# Patient Record
Sex: Female | Born: 1960
Health system: Southern US, Community
[De-identification: ages and names within clinical notes are randomized; demographics above are authoritative.]

## PROBLEM LIST (undated history)

## (undated) DIAGNOSIS — I5032 Chronic diastolic (congestive) heart failure: Secondary | ICD-10-CM

## (undated) DIAGNOSIS — M1990 Unspecified osteoarthritis, unspecified site: Secondary | ICD-10-CM

## (undated) DIAGNOSIS — B9681 Helicobacter pylori [H. pylori] as the cause of diseases classified elsewhere: Secondary | ICD-10-CM

## (undated) DIAGNOSIS — G4730 Sleep apnea, unspecified: Secondary | ICD-10-CM

## (undated) DIAGNOSIS — K259 Gastric ulcer, unspecified as acute or chronic, without hemorrhage or perforation: Secondary | ICD-10-CM

## (undated) DIAGNOSIS — J302 Other seasonal allergic rhinitis: Secondary | ICD-10-CM

## (undated) DIAGNOSIS — I499 Cardiac arrhythmia, unspecified: Secondary | ICD-10-CM

## (undated) DIAGNOSIS — I509 Heart failure, unspecified: Secondary | ICD-10-CM

## (undated) DIAGNOSIS — K029 Dental caries, unspecified: Secondary | ICD-10-CM

## (undated) DIAGNOSIS — I480 Paroxysmal atrial fibrillation: Secondary | ICD-10-CM

## (undated) DIAGNOSIS — D649 Anemia, unspecified: Secondary | ICD-10-CM

## (undated) DIAGNOSIS — J449 Chronic obstructive pulmonary disease, unspecified: Secondary | ICD-10-CM

## (undated) DIAGNOSIS — J45909 Unspecified asthma, uncomplicated: Secondary | ICD-10-CM

## (undated) DIAGNOSIS — F419 Anxiety disorder, unspecified: Secondary | ICD-10-CM

## (undated) DIAGNOSIS — K2970 Gastritis, unspecified, without bleeding: Secondary | ICD-10-CM

## (undated) DIAGNOSIS — K921 Melena: Secondary | ICD-10-CM

## (undated) DIAGNOSIS — J189 Pneumonia, unspecified organism: Secondary | ICD-10-CM

## (undated) DIAGNOSIS — I10 Essential (primary) hypertension: Secondary | ICD-10-CM

## (undated) DIAGNOSIS — IMO0001 Reserved for inherently not codable concepts without codable children: Secondary | ICD-10-CM

## (undated) HISTORY — DX: Helicobacter pylori (H. pylori) as the cause of diseases classified elsewhere: B96.81

## (undated) HISTORY — DX: Gastric ulcer, unspecified as acute or chronic, without hemorrhage or perforation: K25.9

## (undated) HISTORY — DX: Dental caries, unspecified: K02.9

## (undated) HISTORY — DX: Essential (primary) hypertension: I10

## (undated) HISTORY — DX: Other seasonal allergic rhinitis: J30.2

## (undated) HISTORY — DX: Heart failure, unspecified: I50.9

## (undated) HISTORY — PX: TUBAL LIGATION: SHX77

## (undated) HISTORY — PX: FRACTURE SURGERY: SHX138

## (undated) HISTORY — DX: Gastritis, unspecified, without bleeding: K29.70

---
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
## 1982-07-21 HISTORY — PX: NASAL SINUS SURGERY: SHX719

## 1988-07-21 HISTORY — PX: OVARIAN CYST REMOVAL: SHX89

## 1995-07-22 HISTORY — PX: BARTHOLIN CYST MARSUPIALIZATION: SHX5383

## 1997-11-19 ENCOUNTER — Emergency Department (HOSPITAL_COMMUNITY): Admission: EM | Admit: 1997-11-19 | Discharge: 1997-11-19 | Payer: Self-pay | Admitting: Emergency Medicine

## 1997-11-20 ENCOUNTER — Ambulatory Visit (HOSPITAL_COMMUNITY): Admission: RE | Admit: 1997-11-20 | Discharge: 1997-11-20 | Payer: Self-pay | Admitting: Internal Medicine

## 1998-09-22 ENCOUNTER — Emergency Department (HOSPITAL_COMMUNITY): Admission: EM | Admit: 1998-09-22 | Discharge: 1998-09-22 | Payer: Self-pay | Admitting: Emergency Medicine

## 1998-09-22 ENCOUNTER — Encounter: Payer: Self-pay | Admitting: Emergency Medicine

## 1999-03-14 ENCOUNTER — Ambulatory Visit (HOSPITAL_COMMUNITY): Admission: RE | Admit: 1999-03-14 | Discharge: 1999-03-14 | Payer: Self-pay | Admitting: Family Medicine

## 1999-03-14 ENCOUNTER — Encounter: Payer: Self-pay | Admitting: Family Medicine

## 1999-05-21 ENCOUNTER — Emergency Department (HOSPITAL_COMMUNITY): Admission: EM | Admit: 1999-05-21 | Discharge: 1999-05-21 | Payer: Self-pay | Admitting: *Deleted

## 1999-05-21 ENCOUNTER — Encounter: Payer: Self-pay | Admitting: Emergency Medicine

## 1999-12-06 ENCOUNTER — Encounter: Payer: Self-pay | Admitting: Emergency Medicine

## 1999-12-06 ENCOUNTER — Emergency Department (HOSPITAL_COMMUNITY): Admission: EM | Admit: 1999-12-06 | Discharge: 1999-12-07 | Payer: Self-pay | Admitting: Emergency Medicine

## 2000-05-31 ENCOUNTER — Emergency Department (HOSPITAL_COMMUNITY): Admission: EM | Admit: 2000-05-31 | Discharge: 2000-06-01 | Payer: Self-pay | Admitting: Emergency Medicine

## 2000-05-31 ENCOUNTER — Encounter: Payer: Self-pay | Admitting: Emergency Medicine

## 2000-07-21 HISTORY — PX: COLONOSCOPY: SHX174

## 2001-07-20 LAB — HM COLONOSCOPY: HM Colonoscopy: NORMAL

## 2003-02-09 ENCOUNTER — Emergency Department (HOSPITAL_COMMUNITY): Admission: AD | Admit: 2003-02-09 | Discharge: 2003-02-09 | Payer: Self-pay | Admitting: Emergency Medicine

## 2003-02-09 ENCOUNTER — Encounter: Payer: Self-pay | Admitting: Emergency Medicine

## 2003-04-04 ENCOUNTER — Emergency Department (HOSPITAL_COMMUNITY): Admission: EM | Admit: 2003-04-04 | Discharge: 2003-04-04 | Payer: Self-pay

## 2003-07-22 HISTORY — PX: FRACTURE SURGERY: SHX138

## 2003-07-22 HISTORY — PX: UMBILICAL HERNIA REPAIR: SHX196

## 2004-09-08 ENCOUNTER — Ambulatory Visit: Payer: Self-pay | Admitting: Psychiatry

## 2004-09-08 ENCOUNTER — Encounter: Payer: Self-pay | Admitting: Emergency Medicine

## 2004-09-09 ENCOUNTER — Inpatient Hospital Stay (HOSPITAL_COMMUNITY): Admission: EM | Admit: 2004-09-09 | Discharge: 2004-09-13 | Payer: Self-pay | Admitting: Psychiatry

## 2004-12-31 ENCOUNTER — Emergency Department (HOSPITAL_COMMUNITY): Admission: EM | Admit: 2004-12-31 | Discharge: 2004-12-31 | Payer: Self-pay | Admitting: Family Medicine

## 2005-01-12 ENCOUNTER — Emergency Department (HOSPITAL_COMMUNITY): Admission: EM | Admit: 2005-01-12 | Discharge: 2005-01-12 | Payer: Self-pay | Admitting: Emergency Medicine

## 2005-02-19 ENCOUNTER — Ambulatory Visit: Payer: Self-pay | Admitting: Physical Medicine & Rehabilitation

## 2005-02-19 ENCOUNTER — Inpatient Hospital Stay (HOSPITAL_COMMUNITY): Admission: EM | Admit: 2005-02-19 | Discharge: 2005-02-27 | Payer: Self-pay | Admitting: Emergency Medicine

## 2005-05-20 ENCOUNTER — Encounter: Admission: RE | Admit: 2005-05-20 | Discharge: 2005-07-01 | Payer: Self-pay | Admitting: Orthopedic Surgery

## 2006-03-06 ENCOUNTER — Inpatient Hospital Stay (HOSPITAL_COMMUNITY): Admission: AD | Admit: 2006-03-06 | Discharge: 2006-03-06 | Payer: Self-pay | Admitting: Obstetrics & Gynecology

## 2006-11-25 ENCOUNTER — Emergency Department (HOSPITAL_COMMUNITY): Admission: EM | Admit: 2006-11-25 | Discharge: 2006-11-25 | Payer: Self-pay | Admitting: Emergency Medicine

## 2007-05-13 ENCOUNTER — Emergency Department (HOSPITAL_COMMUNITY): Admission: EM | Admit: 2007-05-13 | Discharge: 2007-05-13 | Payer: Self-pay | Admitting: Emergency Medicine

## 2007-11-08 ENCOUNTER — Emergency Department (HOSPITAL_COMMUNITY): Admission: EM | Admit: 2007-11-08 | Discharge: 2007-11-08 | Payer: Self-pay | Admitting: Emergency Medicine

## 2008-01-16 ENCOUNTER — Emergency Department (HOSPITAL_COMMUNITY): Admission: EM | Admit: 2008-01-16 | Discharge: 2008-01-16 | Payer: Self-pay | Admitting: Emergency Medicine

## 2008-04-18 ENCOUNTER — Emergency Department (HOSPITAL_COMMUNITY): Admission: EM | Admit: 2008-04-18 | Discharge: 2008-04-18 | Payer: Self-pay | Admitting: Emergency Medicine

## 2008-12-19 ENCOUNTER — Emergency Department (HOSPITAL_COMMUNITY): Admission: EM | Admit: 2008-12-19 | Discharge: 2008-12-19 | Payer: Self-pay | Admitting: Emergency Medicine

## 2009-03-30 ENCOUNTER — Inpatient Hospital Stay (HOSPITAL_COMMUNITY): Admission: EM | Admit: 2009-03-30 | Discharge: 2009-04-04 | Payer: Self-pay | Admitting: Emergency Medicine

## 2009-04-01 ENCOUNTER — Ambulatory Visit: Payer: Self-pay | Admitting: Psychiatry

## 2009-05-02 ENCOUNTER — Ambulatory Visit: Payer: Self-pay | Admitting: Internal Medicine

## 2010-01-22 ENCOUNTER — Emergency Department (HOSPITAL_COMMUNITY): Admission: EM | Admit: 2010-01-22 | Discharge: 2010-01-22 | Payer: Self-pay | Admitting: Emergency Medicine

## 2010-03-13 ENCOUNTER — Ambulatory Visit: Payer: Self-pay | Admitting: Pulmonary Disease

## 2010-03-14 ENCOUNTER — Inpatient Hospital Stay (HOSPITAL_COMMUNITY): Admission: EM | Admit: 2010-03-14 | Discharge: 2010-03-15 | Payer: Self-pay | Admitting: Emergency Medicine

## 2010-03-14 ENCOUNTER — Ambulatory Visit: Payer: Self-pay | Admitting: Internal Medicine

## 2010-03-15 ENCOUNTER — Encounter: Payer: Self-pay | Admitting: Pulmonary Disease

## 2010-10-03 LAB — CBC
HCT: 34.8 % — ABNORMAL LOW (ref 36.0–46.0)
Hemoglobin: 11.1 g/dL — ABNORMAL LOW (ref 12.0–15.0)
Hemoglobin: 12.6 g/dL (ref 12.0–15.0)
MCH: 26.4 pg (ref 26.0–34.0)
MCHC: 31.9 g/dL (ref 30.0–36.0)
MCV: 82.7 fL (ref 78.0–100.0)
Platelets: 233 10*3/uL (ref 150–400)
RBC: 4.08 MIL/uL (ref 3.87–5.11)
RDW: 17.9 % — ABNORMAL HIGH (ref 11.5–15.5)
RDW: 18 % — ABNORMAL HIGH (ref 11.5–15.5)
WBC: 11.1 10*3/uL — ABNORMAL HIGH (ref 4.0–10.5)
WBC: 5.3 10*3/uL (ref 4.0–10.5)

## 2010-10-03 LAB — CARDIAC PANEL(CRET KIN+CKTOT+MB+TROPI)
CK, MB: 1 ng/mL (ref 0.3–4.0)
Relative Index: INVALID (ref 0.0–2.5)
Relative Index: INVALID (ref 0.0–2.5)
Total CK: 70 U/L (ref 7–177)
Troponin I: 0.04 ng/mL (ref 0.00–0.06)

## 2010-10-03 LAB — URINALYSIS, ROUTINE W REFLEX MICROSCOPIC
Bilirubin Urine: NEGATIVE
Ketones, ur: NEGATIVE mg/dL
Nitrite: NEGATIVE
Specific Gravity, Urine: 1.005 (ref 1.005–1.030)
Urobilinogen, UA: 0.2 mg/dL (ref 0.0–1.0)

## 2010-10-03 LAB — HEPATIC FUNCTION PANEL
Albumin: 3.9 g/dL (ref 3.5–5.2)
Alkaline Phosphatase: 75 U/L (ref 39–117)
Total Protein: 7.5 g/dL (ref 6.0–8.3)

## 2010-10-03 LAB — DIFFERENTIAL
Basophils Absolute: 0 10*3/uL (ref 0.0–0.1)
Basophils Absolute: 0 10*3/uL (ref 0.0–0.1)
Basophils Relative: 1 % (ref 0–1)
Eosinophils Absolute: 0 10*3/uL (ref 0.0–0.7)
Eosinophils Absolute: 0.1 10*3/uL (ref 0.0–0.7)
Eosinophils Relative: 2 % (ref 0–5)
Lymphocytes Relative: 27 % (ref 12–46)
Lymphocytes Relative: 33 % (ref 12–46)
Lymphs Abs: 1.5 10*3/uL (ref 0.7–4.0)
Lymphs Abs: 2.9 10*3/uL (ref 0.7–4.0)
Monocytes Absolute: 0.5 10*3/uL (ref 0.1–1.0)
Monocytes Absolute: 0.6 10*3/uL (ref 0.1–1.0)
Monocytes Relative: 12 % (ref 3–12)
Monocytes Relative: 5 % (ref 3–12)
Neutrophils Relative %: 67 % (ref 43–77)
Neutrophils Relative %: 75 % (ref 43–77)

## 2010-10-03 LAB — POCT I-STAT 3, ART BLOOD GAS (G3+)
Acid-Base Excess: 5 mmol/L — ABNORMAL HIGH (ref 0.0–2.0)
O2 Saturation: 100 %
pO2, Arterial: 491 mmHg — ABNORMAL HIGH (ref 80.0–100.0)

## 2010-10-03 LAB — COMPREHENSIVE METABOLIC PANEL
ALT: 21 U/L (ref 0–35)
AST: 25 U/L (ref 0–37)
Albumin: 3.4 g/dL — ABNORMAL LOW (ref 3.5–5.2)
Alkaline Phosphatase: 68 U/L (ref 39–117)
CO2: 28 mEq/L (ref 19–32)
Calcium: 9.3 mg/dL (ref 8.4–10.5)
Chloride: 99 mEq/L (ref 96–112)
Creatinine, Ser: 0.65 mg/dL (ref 0.4–1.2)
GFR calc Af Amer: >60 mL/min (ref >60)
GFR calc non Af Amer: >60 mL/min (ref >60)
Glucose, Bld: 91 mg/dL (ref 70–99)
Potassium: 3.7 mEq/L (ref 3.5–5.1)
Sodium: 140 mEq/L (ref 135–145)
Total Bilirubin: 0.8 mg/dL (ref 0.3–1.2)
Total Protein: 6.9 g/dL (ref 6.0–8.3)

## 2010-10-03 LAB — CULTURE, BLOOD (ROUTINE X 2)
Culture: NO GROWTH
Culture: NO GROWTH

## 2010-10-03 LAB — BASIC METABOLIC PANEL
BUN: 7 mg/dL (ref 6–23)
GFR calc non Af Amer: >60 mL/min (ref >60)
Glucose, Bld: 105 mg/dL — ABNORMAL HIGH (ref 70–99)
Sodium: 139 mEq/L (ref 135–145)

## 2010-10-03 LAB — LIPID PANEL
HDL: 76 mg/dL (ref >39)
LDL Cholesterol: 91 mg/dL (ref 0–99)
Total CHOL/HDL Ratio: 2.4 RATIO
VLDL: 12 mg/dL (ref 0–40)

## 2010-10-03 LAB — POCT CARDIAC MARKERS
CKMB, poc: <1 ng/mL — ABNORMAL LOW (ref 1.0–8.0)
Myoglobin, poc: 29.8 ng/mL (ref 12–200)
Myoglobin, poc: 70.8 ng/mL (ref 12–200)
Troponin i, poc: <0.05 ng/mL (ref 0.00–0.09)
Troponin i, poc: <0.05 ng/mL (ref 0.00–0.09)

## 2010-10-03 LAB — SEDIMENTATION RATE: Sed Rate: 10 mm/hr (ref 0–22)

## 2010-10-03 LAB — CK TOTAL AND CKMB (NOT AT ARMC): Relative Index: INVALID (ref 0.0–2.5)

## 2010-10-03 LAB — D-DIMER, QUANTITATIVE: D-Dimer, Quant: 0.52 ug/mL-FEU — ABNORMAL HIGH (ref 0.00–0.48)

## 2010-10-03 LAB — RAPID URINE DRUG SCREEN, HOSP PERFORMED
Barbiturates: NOT DETECTED
Benzodiazepines: NOT DETECTED
Cocaine: NOT DETECTED

## 2010-10-03 LAB — PROTIME-INR
INR: 0.95 (ref 0.00–1.49)
Prothrombin Time: 12.9 seconds (ref 11.6–15.2)

## 2010-10-03 LAB — URINE CULTURE

## 2010-10-03 LAB — TSH
TSH: 1.66 u[IU]/mL (ref 0.350–4.500)
TSH: 3.632 u[IU]/mL (ref 0.350–4.500)

## 2010-10-03 LAB — STREP PNEUMONIAE URINARY ANTIGEN: Strep Pneumo Urinary Antigen: NEGATIVE

## 2010-10-03 LAB — BRAIN NATRIURETIC PEPTIDE: Pro B Natriuretic peptide (BNP): 59 pg/mL (ref 0.0–100.0)

## 2010-10-25 LAB — COMPREHENSIVE METABOLIC PANEL
AST: 22 U/L (ref 0–37)
AST: 23 U/L (ref 0–37)
Albumin: 3.2 g/dL — ABNORMAL LOW (ref 3.5–5.2)
Albumin: 3.5 g/dL (ref 3.5–5.2)
BUN: 6 mg/dL (ref 6–23)
CO2: 22 mEq/L (ref 19–32)
Calcium: 8.1 mg/dL — ABNORMAL LOW (ref 8.4–10.5)
Chloride: 110 mEq/L (ref 96–112)
Creatinine, Ser: 0.59 mg/dL (ref 0.4–1.2)
GFR calc Af Amer: >60 mL/min (ref >60)
Glucose, Bld: 101 mg/dL — ABNORMAL HIGH (ref 70–99)
Potassium: 3.3 mEq/L — ABNORMAL LOW (ref 3.5–5.1)
Sodium: 141 mEq/L (ref 135–145)
Total Bilirubin: 1.5 mg/dL — ABNORMAL HIGH (ref 0.3–1.2)
Total Bilirubin: 2.5 mg/dL — ABNORMAL HIGH (ref 0.3–1.2)
Total Protein: 6.5 g/dL (ref 6.0–8.3)
Total Protein: 7.2 g/dL (ref 6.0–8.3)

## 2010-10-25 LAB — HEPATIC FUNCTION PANEL
ALT: 22 U/L (ref 0–35)
ALT: 31 U/L (ref 0–35)
AST: 27 U/L (ref 0–37)
AST: 27 U/L (ref 0–37)
AST: 28 U/L (ref 0–37)
AST: 32 U/L (ref 0–37)
Albumin: 3.2 g/dL — ABNORMAL LOW (ref 3.5–5.2)
Albumin: 3.3 g/dL — ABNORMAL LOW (ref 3.5–5.2)
Albumin: 3.4 g/dL — ABNORMAL LOW (ref 3.5–5.2)
Albumin: 3.6 g/dL (ref 3.5–5.2)
Alkaline Phosphatase: 50 U/L (ref 39–117)
Alkaline Phosphatase: 53 U/L (ref 39–117)
Bilirubin, Direct: 0.1 mg/dL (ref 0.0–0.3)
Bilirubin, Direct: 0.2 mg/dL (ref 0.0–0.3)
Bilirubin, Direct: 0.3 mg/dL (ref 0.0–0.3)
Bilirubin, Direct: 0.4 mg/dL — ABNORMAL HIGH (ref 0.0–0.3)
Bilirubin, Direct: 0.4 mg/dL — ABNORMAL HIGH (ref 0.0–0.3)
Indirect Bilirubin: 0.5 mg/dL (ref 0.3–0.9)
Indirect Bilirubin: 2 mg/dL — ABNORMAL HIGH (ref 0.3–0.9)
Indirect Bilirubin: 2.1 mg/dL — ABNORMAL HIGH (ref 0.3–0.9)
Indirect Bilirubin: 2.3 mg/dL — ABNORMAL HIGH (ref 0.3–0.9)
Indirect Bilirubin: 2.7 mg/dL — ABNORMAL HIGH (ref 0.3–0.9)
Total Bilirubin: 0.6 mg/dL (ref 0.3–1.2)
Total Bilirubin: 2.4 mg/dL — ABNORMAL HIGH (ref 0.3–1.2)
Total Bilirubin: 3.1 mg/dL — ABNORMAL HIGH (ref 0.3–1.2)
Total Protein: 6.6 g/dL (ref 6.0–8.3)
Total Protein: 6.6 g/dL (ref 6.0–8.3)
Total Protein: 6.8 g/dL (ref 6.0–8.3)

## 2010-10-25 LAB — ACETAMINOPHEN LEVEL
Acetaminophen (Tylenol), Serum: 10.7 ug/mL (ref 10–30)
Acetaminophen (Tylenol), Serum: 104.3 ug/mL — ABNORMAL HIGH (ref 10–30)
Acetaminophen (Tylenol), Serum: 69.5 ug/mL — ABNORMAL HIGH (ref 10–30)
Acetaminophen (Tylenol), Serum: <10 ug/mL — ABNORMAL LOW (ref 10–30)
Acetaminophen (Tylenol), Serum: <10 ug/mL — ABNORMAL LOW (ref 10–30)
Acetaminophen (Tylenol), Serum: <10 ug/mL — ABNORMAL LOW (ref 10–30)
Acetaminophen (Tylenol), Serum: <10 ug/mL — ABNORMAL LOW (ref 10–30)

## 2010-10-25 LAB — URINALYSIS, MICROSCOPIC ONLY
Glucose, UA: NEGATIVE mg/dL
Ketones, ur: NEGATIVE mg/dL
Nitrite: NEGATIVE
Specific Gravity, Urine: 1.008 (ref 1.005–1.030)
pH: 6 (ref 5.0–8.0)

## 2010-10-25 LAB — TRICYCLICS SCREEN, URINE: TCA Scrn: NOT DETECTED

## 2010-10-25 LAB — PROTIME-INR
INR: 1 (ref 0.00–1.49)
INR: 1 (ref 0.00–1.49)
INR: 1.2 (ref 0.00–1.49)
Prothrombin Time: 13.3 seconds (ref 11.6–15.2)
Prothrombin Time: 14.6 seconds (ref 11.6–15.2)

## 2010-10-25 LAB — DIFFERENTIAL
Basophils Relative: 1 % (ref 0–1)
Lymphocytes Relative: 44 % (ref 12–46)
Lymphs Abs: 2.7 10*3/uL (ref 0.7–4.0)
Monocytes Absolute: 0.4 10*3/uL (ref 0.1–1.0)
Monocytes Relative: 6 % (ref 3–12)
Neutro Abs: 3 10*3/uL (ref 1.7–7.7)
Neutrophils Relative %: 48 % (ref 43–77)

## 2010-10-25 LAB — BASIC METABOLIC PANEL
CO2: 22 mEq/L (ref 19–32)
Calcium: 8.8 mg/dL (ref 8.4–10.5)
Calcium: 8.9 mg/dL (ref 8.4–10.5)
Creatinine, Ser: 0.54 mg/dL (ref 0.4–1.2)
Creatinine, Ser: 0.58 mg/dL (ref 0.4–1.2)
GFR calc Af Amer: >60 mL/min (ref >60)
GFR calc Af Amer: >60 mL/min (ref >60)
GFR calc non Af Amer: >60 mL/min (ref >60)
GFR calc non Af Amer: >60 mL/min (ref >60)
Sodium: 138 mEq/L (ref 135–145)
Sodium: 142 mEq/L (ref 135–145)

## 2010-10-25 LAB — ETHANOL
Alcohol, Ethyl (B): 226 mg/dL — ABNORMAL HIGH (ref 0–10)
Alcohol, Ethyl (B): 319 mg/dL — ABNORMAL HIGH (ref 0–10)

## 2010-10-25 LAB — APTT: aPTT: 26 seconds (ref 24–37)

## 2010-10-25 LAB — CARDIAC PANEL(CRET KIN+CKTOT+MB+TROPI)
CK, MB: 1.1 ng/mL (ref 0.3–4.0)
CK, MB: 1.2 ng/mL (ref 0.3–4.0)
Relative Index: INVALID (ref 0.0–2.5)
Relative Index: INVALID (ref 0.0–2.5)
Total CK: 57 U/L (ref 7–177)
Troponin I: 0.03 ng/mL (ref 0.00–0.06)
Troponin I: 0.03 ng/mL (ref 0.00–0.06)

## 2010-10-25 LAB — URINE CULTURE

## 2010-10-25 LAB — TROPONIN I
Troponin I: 0.02 ng/mL (ref 0.00–0.06)
Troponin I: 0.02 ng/mL (ref 0.00–0.06)

## 2010-10-25 LAB — RAPID URINE DRUG SCREEN, HOSP PERFORMED
Cocaine: NOT DETECTED
Tetrahydrocannabinol: NOT DETECTED

## 2010-10-25 LAB — CK TOTAL AND CKMB (NOT AT ARMC)
CK, MB: 1.1 ng/mL (ref 0.3–4.0)
Relative Index: INVALID (ref 0.0–2.5)

## 2010-10-25 LAB — CBC
Hemoglobin: 13.4 g/dL (ref 12.0–15.0)
Hemoglobin: 13.6 g/dL (ref 12.0–15.0)
MCHC: 33.6 g/dL (ref 30.0–36.0)
MCHC: 34 g/dL (ref 30.0–36.0)
RBC: 4.2 MIL/uL (ref 3.87–5.11)
RBC: 4.34 MIL/uL (ref 3.87–5.11)
RDW: 14.1 % (ref 11.5–15.5)
WBC: 6.2 10*3/uL (ref 4.0–10.5)

## 2010-10-28 LAB — URINALYSIS, ROUTINE W REFLEX MICROSCOPIC
Bilirubin Urine: NEGATIVE
Glucose, UA: NEGATIVE mg/dL
Hgb urine dipstick: NEGATIVE
Ketones, ur: NEGATIVE mg/dL
Nitrite: NEGATIVE
Protein, ur: NEGATIVE mg/dL
Specific Gravity, Urine: 1.008 (ref 1.005–1.030)
Urobilinogen, UA: 1 mg/dL (ref 0.0–1.0)
pH: 7.5 (ref 5.0–8.0)

## 2010-10-28 LAB — POCT CARDIAC MARKERS
CKMB, poc: <1 ng/mL — ABNORMAL LOW (ref 1.0–8.0)
Troponin i, poc: <0.05 ng/mL (ref 0.00–0.09)

## 2010-10-28 LAB — RAPID URINE DRUG SCREEN, HOSP PERFORMED
Amphetamines: NOT DETECTED
Barbiturates: NOT DETECTED
Benzodiazepines: NOT DETECTED
Cocaine: NOT DETECTED
Opiates: NOT DETECTED
Tetrahydrocannabinol: NOT DETECTED

## 2010-10-28 LAB — DIFFERENTIAL
Basophils Absolute: 0 10*3/uL (ref 0.0–0.1)
Basophils Relative: 1 % (ref 0–1)
Eosinophils Absolute: 0 10*3/uL (ref 0.0–0.7)
Eosinophils Relative: 1 % (ref 0–5)
Lymphocytes Relative: 26 % (ref 12–46)
Lymphs Abs: 0.9 10*3/uL (ref 0.7–4.0)
Monocytes Absolute: 0.4 10*3/uL (ref 0.1–1.0)
Monocytes Relative: 13 % — ABNORMAL HIGH (ref 3–12)
Neutro Abs: 2 10*3/uL (ref 1.7–7.7)
Neutrophils Relative %: 59 % (ref 43–77)

## 2010-10-28 LAB — COMPREHENSIVE METABOLIC PANEL
ALT: 35 U/L (ref 0–35)
Albumin: 3.8 g/dL (ref 3.5–5.2)
Calcium: 9.7 mg/dL (ref 8.4–10.5)
GFR calc Af Amer: >60 mL/min (ref >60)
Glucose, Bld: 94 mg/dL (ref 70–99)
Potassium: 3.7 mEq/L (ref 3.5–5.1)
Sodium: 140 mEq/L (ref 135–145)
Total Protein: 7.7 g/dL (ref 6.0–8.3)

## 2010-10-28 LAB — CBC
Hemoglobin: 13.7 g/dL (ref 12.0–15.0)
MCHC: 34.5 g/dL (ref 30.0–36.0)
Platelets: 207 10*3/uL (ref 150–400)
RDW: 14.6 % (ref 11.5–15.5)

## 2010-12-06 NOTE — Op Note (Signed)
NAMEMICKENZIE, STOLAR                 ACCOUNT NO.:  [id number]   MEDICAL RECORD NO.:  [id number]          PATIENT TYPE:  INP   LOCATION:  5013                         FACILITY:  [facility]   PHYSICIAN:  Ollen Gross, M.D.    DATE OF BIRTH:  07-Oct-1960   DATE OF PROCEDURE:  02/19/2005  DATE OF DISCHARGE:                                 OPERATIVE REPORT   PREOPERATIVE DIAGNOSIS:  Left bicondylar tibial plateau fracture.   POSTOPERATIVE DIAGNOSIS:  Left bicondylar tibial plateau fracture.   OPERATION PERFORMED:  Open reduction internal fixation, left tibial plateau  fracture.   SURGEON:  Ollen Gross, M.D.   ASSISTANT:  Alexzandrew L. Julien Girt, P.A.   ANESTHESIA:  General.   ESTIMATED BLOOD LOSS:  Minimal.   DRAINS:  None.   COMPLICATIONS:  None.   TOURNIQUET TIME:  66 minutes at 300 mmHg.   CONDITION:  Stable to recovery.   INDICATIONS FOR PROCEDURE:  Ms. Elmore is a 50 year old female who had a fall  on her back earlier today, sustained the above mentioned bicondylar  comminuted tibial plateau fracture.  She presented to the emergency room  with this injury and had intact neurovascular function in the left lower  extremity.  She had soft compartments at the time of evaluation and is  subsequently to the operating room for open reduction internal fixation with  bone grafting.   DESCRIPTION OF PROCEDURE:  After successful administration of general  anesthetic, a tourniquet was placed high on the left thigh and left lower  extremity prepped and draped in the usual sterile fashion.  Extremity was  wrapped in an Esmarch, tourniquet inflated to 300 mmHg.  Incisions made over  the tibial crest anteriorly starting at about four inches below the joint  line coursing up to the joint line and then at a right angle coursing  laterally towards the fibular head.  The skin was cut with a 10 blade  through the subcutaneous tissue to the periosteum. The muscles in the  lateral compartment  were subperiosteally elevated to reveal the bone.  This  is a grossly comminuted fracture involving both condyles.  First I placed  two guide pins with 6.5 cannulated screws up proximally to effectively  restore the joint line and get the condyles back together.  After we did  this, I packed 15 mL of cancellous graft with the I.C. graft chamber bone.  This restored the joint line.  We then placed the cancellous screws 6.5 mm x  70 and 75.  Respectively, I closed down the condyles but there was still  comminution at the metaphyseal condylar junction.  The Ace tibial plateau  locking plate 5-hole was utilized.  The plate was clamped to the tibia and  then we placed on of the proximal locking screws and then placed a  bicortical screw in the plate along the shaft.  This effectively stabilized  the fracture and I was very pleased with the alignment.  We placed two more  proximal locking screws 4.5 mm and then two other locking screws of 5.5 mm  diameter and then  two other cortical screws distally.  This had a very  stable construct and it was felt to be in very good alignment, AP oblique  planes.  We then released the tourniquet with a total time of 66 minutes.  The wound was copiously irrigated with saline solution.  The periosteum  closed over the plate distally with interrupted #1 Vicryl.  Subcu closed  with interrupted 2-0 Vicryl and skin closed with staples.  Incisions cleaned  and dried and a bulky sterile dressing applied.  She was placed into a knee  immobilizer, awakened and transported to recovery in stable condition.       FA/MEDQ  D:  02/19/2005  T:  02/20/2005  Job:  [id number]

## 2010-12-06 NOTE — Discharge Summary (Signed)
[name], [name] [name]                 ACCOUNT NO.:  [id number]   MEDICAL RECORD NO.:  [id number]          PATIENT TYPE:  IPS   LOCATION:  0304                          [facility]   PHYSICIAN:  Jeanice Lim, M.D. DATE OF BIRTH:  Dec 09, 1960   DATE OF ADMISSION:  09/09/2004  DATE OF DISCHARGE:  09/13/2004                                 DISCHARGE SUMMARY   NO DICTATION FOR THIS JOB NUMBER.      JEM/MEDQ  D:  10/19/2004  T:  10/19/2004  Job:  [id number]
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
## 2010-12-06 NOTE — Discharge Summary (Signed)
[name] [name], [name] [name] NO.:  [id number]   MEDICAL RECORD NO.:  [id number]          PATIENT TYPE:  IPS   LOCATION:  0304                          [facility]ILITY[facility]  [facility]   PHYSICIAN:  Jeanice Lim, M.D. DATE OF BIRTH:  1961/01/18   DATE OF ADMISSION:  09/09/2004  DATE OF DISCHARGE:  09/13/2004                                 DISCHARGE SUMMARY   IDENTIFYING DATA:  This is a 50 year old African-American female, divorced,  voluntarily admitted.  Requesting detox from alcohol.  Sitting alone in  house all day.  Feeling worthless, useless, disgusted with self for  drinking.  Alcohol level 293 on admission.  Having suicidal thoughts.  Feeling that she could not go on.   MEDICATIONS:  Checked at [facility].  Hydrochlorothiazide for blood pressure  in the past.  No medications at this current time.   ALLERGIES:  TETRACYCLINE.   PHYSICAL EXAMINATION:  Performed in the emergency room and essentially  within normal limits.  Neurologically nonfocal.   LABORATORY DATA:  Routine admission labs within normal limits.   MENTAL STATUS EXAM:  Fully alert, appropriate, congruent affect, blunted,  constricted.  Speech soft tone.  Mood guilty, ashamed of alcohol, depressed.  Affect constricted.  Thought processes goal directed.  No psychotic  symptoms.  Passive suicidal ideation.  Cognitively intact.  Judgment and  insight were somewhat impaired.   ADMISSION DIAGNOSES:   AXIS I:  1.  Major depressive disorder versus substance-induced depressive disorder.  2.  Alcohol dependence.   AXIS II:  None.   AXIS III:  1.  Hypertension.  2.  Fracture of fourth DIP.   AXIS IV:  Severe (stressors related to family, relationship stress,  parenting stress and problems with primary support group).   AXIS V:  25/59-60.   HOSPITAL COURSE:  The patient was admitted and ordered routine p.r.n.  medications and underwent further monitoring.  Was encouraged to participate  in individual, group  and milieu therapy.  Was placed on detox protocol.  Monitored for safety.  CIWAs monitored.  Required Librium.  Antidepressant  was considered and aftercare plan as well as relapse prevention plan worked  on.  The patient reported gradually gaining insight and feeling that there  was a reason to live without needing alcohol or a man.  Described a gradual  improvement in withdrawal symptoms, positive response to treatment and  continued to improve.   CONDITION ON DISCHARGE:  Discharged in improved condition.  Mood euthymic.  Affect brighter.  Thought processes goal directed.  Thought content negative  for dangerous ideation or psychotic symptoms.  No acute withdrawal symptoms.  The patient was given medication education.   DISCHARGE MEDICATIONS:  1.  ReVia 50 mg q.a.m.  2.  Celexa 20 mg, 1/2 q.a.m.  3.  Hydrochlorothiazide 25 mg, 1 daily.  4.  __________ 1 q.h.s.   FOLLOW UP:  The patient was discharged to follow up at [facility] with  Viviann [facility] on Monday, February 27th on [place] at 11 a.m.  The  patient was followed up at [facility] and also  followed up with family doctor  regarding finger which is to be followed up within two weeks regarding the  splint and to follow recommendations given from the emergency room.  The  patient had no acute risk issues.  Risk/benefit ratio and alternative  treatments were discussed regarding medications and medication education  given.  Condition improved.   DISCHARGE DIAGNOSES:   AXIS I:  1.  Major depressive disorder versus substance-induced depressive disorder.  2.  Alcohol dependence.   AXIS II:  None.   AXIS III:  1.  Hypertension.  2.  Fracture of fourth DIP.   AXIS IV:  Severe (stressors related to family, relationship stress,  parenting stress and problems with primary support group).   AXIS V:  Global Assessment of Functioning on discharge 55.      JEM/MEDQ  D:  10/30/2004  T:  10/30/2004  Job:  [id number]

## 2011-01-01 ENCOUNTER — Ambulatory Visit (INDEPENDENT_AMBULATORY_CARE_PROVIDER_SITE_OTHER): Payer: Self-pay

## 2011-01-01 ENCOUNTER — Inpatient Hospital Stay (INDEPENDENT_AMBULATORY_CARE_PROVIDER_SITE_OTHER)
Admission: RE | Admit: 2011-01-01 | Discharge: 2011-01-01 | Disposition: A | Discharge status: Home / Self Care | Payer: Self-pay | Source: Ambulatory Visit | Attending: Family Medicine | Admitting: Family Medicine

## 2011-01-01 DIAGNOSIS — I10 Essential (primary) hypertension: Secondary | ICD-10-CM

## 2011-01-01 LAB — POCT I-STAT, CHEM 8
BUN: 13 mg/dL (ref 6–23)
Chloride: 102 mEq/L (ref 96–112)
HCT: 43 % (ref 36.0–46.0)
Potassium: 4.4 mEq/L (ref 3.5–5.1)

## 2011-04-06 ENCOUNTER — Inpatient Hospital Stay (HOSPITAL_COMMUNITY)
Admission: EM | Admit: 2011-04-06 | Discharge: 2011-04-09 | DRG: 897 | Disposition: A | Discharge status: Home / Self Care | Payer: PRIVATE HEALTH INSURANCE | Source: Other Acute Inpatient Hospital | Attending: Psychiatry | Admitting: Psychiatry

## 2011-04-06 ENCOUNTER — Emergency Department (HOSPITAL_COMMUNITY)
Admission: EM | Admit: 2011-04-06 | Discharge: 2011-04-06 | Disposition: A | Discharge status: Other Acute Inpatient Hospital | Payer: Self-pay | Attending: Emergency Medicine | Admitting: Emergency Medicine

## 2011-04-06 DIAGNOSIS — F1994 Other psychoactive substance use, unspecified with psychoactive substance-induced mood disorder: Secondary | ICD-10-CM

## 2011-04-06 DIAGNOSIS — F3289 Other specified depressive episodes: Secondary | ICD-10-CM | POA: Insufficient documentation

## 2011-04-06 DIAGNOSIS — F329 Major depressive disorder, single episode, unspecified: Secondary | ICD-10-CM | POA: Insufficient documentation

## 2011-04-06 DIAGNOSIS — I10 Essential (primary) hypertension: Secondary | ICD-10-CM | POA: Insufficient documentation

## 2011-04-06 DIAGNOSIS — Z79899 Other long term (current) drug therapy: Secondary | ICD-10-CM

## 2011-04-06 DIAGNOSIS — F1020 Alcohol dependence, uncomplicated: Principal | ICD-10-CM

## 2011-04-06 DIAGNOSIS — F1010 Alcohol abuse, uncomplicated: Secondary | ICD-10-CM | POA: Insufficient documentation

## 2011-04-06 DIAGNOSIS — Z881 Allergy status to other antibiotic agents status: Secondary | ICD-10-CM

## 2011-04-06 LAB — CBC
HCT: 38 % (ref 36.0–46.0)
Hemoglobin: 13.4 g/dL (ref 12.0–15.0)
MCH: 30.8 pg (ref 26.0–34.0)
MCV: 87.4 fL (ref 78.0–100.0)
Platelets: 224 10*3/uL (ref 150–400)
RBC: 4.35 MIL/uL (ref 3.87–5.11)
WBC: 6 10*3/uL (ref 4.0–10.5)

## 2011-04-06 LAB — DIFFERENTIAL
Eosinophils Absolute: 0 10*3/uL (ref 0.0–0.7)
Lymphocytes Relative: 33 % (ref 12–46)
Lymphs Abs: 2 10*3/uL (ref 0.7–4.0)
Monocytes Relative: 6 % (ref 3–12)
Neutro Abs: 3.6 10*3/uL (ref 1.7–7.7)
Neutrophils Relative %: 60 % (ref 43–77)

## 2011-04-06 LAB — RAPID URINE DRUG SCREEN, HOSP PERFORMED
Amphetamines: NOT DETECTED
Benzodiazepines: NOT DETECTED
Tetrahydrocannabinol: NOT DETECTED

## 2011-04-06 LAB — COMPREHENSIVE METABOLIC PANEL
ALT: 29 U/L (ref 0–35)
Albumin: 4 g/dL (ref 3.5–5.2)
Calcium: 8.9 mg/dL (ref 8.4–10.5)
GFR calc Af Amer: >60 mL/min (ref >60)
Glucose, Bld: 126 mg/dL — ABNORMAL HIGH (ref 70–99)
Potassium: 4.1 mEq/L (ref 3.5–5.1)
Sodium: 139 mEq/L (ref 135–145)
Total Protein: 8.3 g/dL (ref 6.0–8.3)

## 2011-04-06 LAB — ETHANOL: Alcohol, Ethyl (B): 284 mg/dL — ABNORMAL HIGH (ref 0–11)

## 2011-04-07 DIAGNOSIS — F1020 Alcohol dependence, uncomplicated: Secondary | ICD-10-CM

## 2011-04-08 LAB — URINALYSIS, ROUTINE W REFLEX MICROSCOPIC
Hgb urine dipstick: NEGATIVE
Nitrite: NEGATIVE
Specific Gravity, Urine: 1.016 (ref 1.005–1.030)
Urobilinogen, UA: 0.2 mg/dL (ref 0.0–1.0)
pH: 5.5 (ref 5.0–8.0)

## 2011-04-09 LAB — BASIC METABOLIC PANEL
Chloride: 101 mEq/L (ref 96–112)
GFR calc Af Amer: >60 mL/min (ref >60)
GFR calc non Af Amer: >60 mL/min (ref >60)
Potassium: 4 mEq/L (ref 3.5–5.1)
Sodium: 139 mEq/L (ref 135–145)

## 2011-04-09 LAB — RPR: RPR Ser Ql: NONREACTIVE

## 2011-04-09 NOTE — Assessment & Plan Note (Signed)
[name] [name], [name] [name] NO.:  [id number]  MEDICAL RECORD NO.:  [id number]  LOCATION:  0305                          FACILITY:  [facility]  PHYSICIAN:  Orson Aloe, MD       DATE OF BIRTH:  February 11, 1961  DATE OF ADMISSION:  04/06/2011 DATE OF DISCHARGE:                      PSYCHIATRIC ADMISSION ASSESSMENT   IDENTIFYING INFORMATION:  This is a 50 year old African-American female. This is a voluntary admission.  HISTORY OF PRESENT ILLNESS:  [facility] admission for [name] [name] who presented in our emergency room with an alcohol level of 284 mg/dL and complaining of depression.  She denies that she has been having suicidal thoughts but says that she has been very depressed over a lot of relationship issues, family issues and having significant financial stressors.  Says she is glad to be here and knows that she has been drinking too much, wanting to get off the alcohol.  She denies other substance abuse.  No homicidal thoughts.  PAST PSYCHIATRIC HISTORY:  No current outpatient treatment.  A previous admission to [facility] in 2006 for treatment of alcohol abuse and mood disturbance.  She has a history of past treatment with Celexa and ReVia but does not remember any effects.  No known history of suicide attempt.  She also has history of medical admission for a Tylenol toxicity not due to intentional overdose but rather chronic use for headache.  SOCIAL HISTORY:  Single mother, cites relationship issues.  Sleepy today and is not clear about how much she has been drinking.  Knows she has been drinking too much alcohol.  Denies any legal problems or DWIs.  FAMILY HISTORY:  Not available.  PAST MEDICAL HISTORY:  No regular primary care provider.  Medical problems are elevated blood pressure with history of hypertension uncontrolled.  Past medical history of pneumonia, alcohol dependence, Tylenol toxicity, left tibial plateau fracture, open reduction  internal fixation.  Denies a history of hepatitis.  Denies a history of seizures.  MEDICATIONS:  Current medications are none.  Past meds include metoprolol and HCTZ.  DRUG ALLERGIES:  Tetracycline.  POSITIVE PHYSICAL FINDINGS:  GENERAL:  A normally-developed African- American female; sleepy today with a bit of facial edema, periorbital edema. VITAL SIGNS:  She is afebrile today with a pulse of 107, blood pressure 160/107. Full physical exam was noted in the emergency room.  She is a normally- developed female in full contact with reality; no abnormal movements noted.  DIAGNOSTIC DATA:  CBC is normal.  Urine drug screen negative for all substances.  Chemistries normal.  BUN 11, creatinine 0.47.  MENTAL STATUS EXAM:  This is a sleepy African-American female who responds quickly to her name.  She is oriented and in full contact with reality.  No signs of delirium or confusion.  Speech is soft, minimal responses.  Admits that she is quite depressed over a lot of stressors in her life.  Denies suicidal thoughts.  Thinking is logical, goal directed.  Gives a minimal history today.  Says that she is glad to be here.  Knows that she needs to get through the program and get off the alcohol for a starter.  Fully oriented.  Insight  adequate.  Impulse control adequate.  Judgment fair.  DIAGNOSES:  AXIS I:  Alcohol abuse rule out dependence. AXIS II:  Deferred. AXIS III:  Elevated blood pressure rule out hypertension uncontrolled. AXIS IV:  Deferred. AXIS V:  Current 40; past year not known.  PLAN:  Plan is to voluntarily admit her with a goal of a safe detox in 4 days.  We are starting her on a Librium detox protocol which so far she has tolerated well, is keeping food and fluids down and will offer half strength Gatorade today for hydration.  We will start her on metoprolol 25 mg twice daily and HCTZ 25 mg q.a.m..  We will also check her routine UA and a pregnancy  test.     Claris Che A. Lorin Picket, N.P.   ______________________________ Orson Aloe, MD    MAS/MEDQ  D:  04/07/2011  T:  04/07/2011  Job:  [id number]  Electronically Signed by Kari Baars N.P. on 04/08/2011 11:49:35 AM Electronically Signed by Orson Aloe  on 04/09/2011 01:12:15 PM

## 2011-04-15 LAB — POCT I-STAT, CHEM 8
Calcium, Ion: 1.04 — ABNORMAL LOW
Chloride: 104
Glucose, Bld: 97
HCT: 39

## 2011-04-15 LAB — URINE CULTURE: Colony Count: 30000

## 2011-04-15 LAB — URINALYSIS, ROUTINE W REFLEX MICROSCOPIC
Glucose, UA: NEGATIVE
Protein, ur: NEGATIVE
Specific Gravity, Urine: 1.004 — ABNORMAL LOW
pH: 6

## 2011-04-15 LAB — ETHANOL: Alcohol, Ethyl (B): 350 — ABNORMAL HIGH

## 2011-04-17 NOTE — Discharge Summary (Signed)
[name], [name] NO.:  [id number]  MEDICAL RECORD NO.:  [id number]  LOCATION:  0305                          FACILITY:  [facility]  PHYSICIAN:  Orson Aloe, MD       DATE OF BIRTH:  10/31/1960  DATE OF ADMISSION:  04/06/2011 DATE OF DISCHARGE:  04/09/2011                              DISCHARGE SUMMARY   IDENTIFYING INFORMATION:  A 50 year old African American female.  This is a voluntary admission.  HISTORY OF PRESENT ILLNESS:  This is a second [facility] admission for [name] [name] who presented by way of our emergency room with an alcohol level of 284 mg/dL and was complaining of depression.  Said that her mood was really low and that she had been very depressed over a lot of relationship issues and family issues and some financial stressors.  Asking for help getting off alcohol, knew that she had been drinking too much.  She denied any other substance abuse.  In spite of the depression, she denied any suicidal thoughts currently or in the recent past.  Also, no homicidal thoughts.  She had a previous admission to [facility] in 2006 for treatment of alcohol abuse and mood disturbance, and at that time was treated with Celexa and Brevia, but does not remember if they were effective.  Currently, on no medications, and no current outpatient treatment.  She also has a previous medical admission for Tylenol toxicity due to unintentional overdose due to chronic Tylenol use for headache.  MEDICAL EVALUATION:  She was medically evaluated in the emergency room where her full physical exam was done.  She presented on our unit with a pulse of 107, blood pressure 160/107 and chronic medical problems include hypertension for which she is receiving no medications.  Alcohol level as noted.  Urine drug screen negative for all substances. Chemistries normal.  BUN 11, creatinine 0.47.  Liver enzymes normal.  COURSE OF HOSPITALIZATION:  She was admitted to our detox unit and  was started on a Librium protocol with a goal of a safe detox in 4 days. After reviewing her medical history.  We noted that she had been previously treated with metoprolol and HCTZ and these were restarted at a dose of 25 mg of metoprolol b.i.d. and HCTZ 25 mg daily.  She presented as a sleepy African American female who responded quickly to her name, oriented and in full contact with reality with no signs of delirium or confusion, was quite sleepy during the first 24 hours.  Said that she was quite depressed, spent the first day in bed.  Next day reported that she felt significantly better.  Participation in group was consistently good going forward.  Her detox was uneventful and she was ready for discharge by the 19th.  She reports that for relapse prevention, she hopes to reduce the stress in her family life by making arrangements for her 50 year old to go to the [employer].  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  She plans on pursuing AA meetings.  DISCHARGE DIAGNOSIS:  AXIS I:  Alcohol abuse and dependence. AXIS  II: Deferred. AXIS III: Hypertension, uncontrolled resolved. AXIS IV:  Moderate family issues and financial constraints. AXIS V: Current 55, past year not known.  DISCHARGE CONDITION:  Stable.  DISCHARGE PLAN:  Follow up at [facility] intensive outpatient program on October 3 at 11:00 a.m. and with their walk-in clinic on September 20 at 8 a.m.  She is also instructed and given information on how to get to the resource center to sign up for medical care at [facility]. We were unable to make appointments today since [facility] is closed.  DISCHARGE MEDICATIONS: 1. Hydrochlorothiazide 25 mg daily. 2. Hydroxyzine 25 mg h.s. p.r.n. insomnia. 3. Metoprolol 25 mg q.a.m. and q.h.s. for hypertension.  She was given prescription for 30 days of new medications and  14-day supply.     Margaret A. Lorin Picket, N.P.   ______________________________ Orson Aloe, MD    MAS/MEDQ  D:  04/09/2011  T:  04/09/2011  Job:  [id number]  Electronically Signed by Kari Baars N.P. on 04/16/2011 05:05:04 PM Electronically Signed by Orson Aloe  on 04/17/2011 10:53:14 AM

## 2011-04-30 LAB — I-STAT 8, (EC8 V) (CONVERTED LAB)
BUN: 4 — ABNORMAL LOW
Chloride: 107
HCT: 39
Hemoglobin: 13.3
Operator id: 257131
pCO2, Ven: 62.5 — ABNORMAL HIGH

## 2011-05-10 ENCOUNTER — Emergency Department (HOSPITAL_COMMUNITY)
Admission: EM | Admit: 2011-05-10 | Discharge: 2011-05-12 | Disposition: A | Discharge status: Home / Self Care | Payer: Self-pay | Attending: Emergency Medicine | Admitting: Emergency Medicine

## 2011-05-10 DIAGNOSIS — F1010 Alcohol abuse, uncomplicated: Secondary | ICD-10-CM | POA: Insufficient documentation

## 2011-05-10 DIAGNOSIS — I10 Essential (primary) hypertension: Secondary | ICD-10-CM | POA: Insufficient documentation

## 2011-05-10 LAB — DIFFERENTIAL
Lymphocytes Relative: 44 % (ref 12–46)
Lymphs Abs: 2.5 10*3/uL (ref 0.7–4.0)
Monocytes Absolute: 0.5 10*3/uL (ref 0.1–1.0)
Monocytes Relative: 8 % (ref 3–12)
Neutro Abs: 2.7 10*3/uL (ref 1.7–7.7)

## 2011-05-10 LAB — COMPREHENSIVE METABOLIC PANEL
ALT: 20 U/L (ref 0–35)
AST: 26 U/L (ref 0–37)
Albumin: 3.8 g/dL (ref 3.5–5.2)
Alkaline Phosphatase: 98 U/L (ref 39–117)
CO2: 26 mEq/L (ref 19–32)
Chloride: 102 mEq/L (ref 96–112)
GFR calc non Af Amer: >90 mL/min (ref >90)
Potassium: 3.6 mEq/L (ref 3.5–5.1)
Sodium: 140 mEq/L (ref 135–145)
Total Bilirubin: 0.2 mg/dL — ABNORMAL LOW (ref 0.3–1.2)

## 2011-05-10 LAB — RAPID URINE DRUG SCREEN, HOSP PERFORMED
Amphetamines: NOT DETECTED
Opiates: NOT DETECTED

## 2011-05-10 LAB — CBC
HCT: 36.8 % (ref 36.0–46.0)
Hemoglobin: 12.6 g/dL (ref 12.0–15.0)
MCH: 30.7 pg (ref 26.0–34.0)
MCHC: 34.2 g/dL (ref 30.0–36.0)
MCV: 89.8 fL (ref 78.0–100.0)

## 2011-05-10 LAB — ETHANOL: Alcohol, Ethyl (B): 336 mg/dL — ABNORMAL HIGH (ref 0–11)

## 2014-09-09 ENCOUNTER — Emergency Department (HOSPITAL_COMMUNITY)
Admission: EM | Admit: 2014-09-09 | Discharge: 2014-09-09 | Disposition: A | Discharge status: Home / Self Care | Payer: PRIVATE HEALTH INSURANCE | Attending: Emergency Medicine | Admitting: Emergency Medicine

## 2014-09-09 ENCOUNTER — Encounter (HOSPITAL_COMMUNITY): Payer: Self-pay | Admitting: Emergency Medicine

## 2014-09-09 DIAGNOSIS — K5641 Fecal impaction: Secondary | ICD-10-CM | POA: Insufficient documentation

## 2014-09-09 DIAGNOSIS — I10 Essential (primary) hypertension: Secondary | ICD-10-CM

## 2014-09-09 MED ORDER — DSS 100 MG PO CAPS
100.0000 mg | ORAL_CAPSULE | Freq: Two times a day (BID) | ORAL | Status: DC
Start: 2014-09-09 — End: 2015-06-22

## 2014-09-09 MED ORDER — LIDOCAINE HCL 2 % EX GEL
1.0000 "application " | Freq: Once | CUTANEOUS | Status: AC
  Administered 2014-09-09: 1 via TOPICAL
  Filled 2014-09-09: qty 10

## 2014-09-09 MED ORDER — POLYETHYLENE GLYCOL 3350 17 GM/SCOOP PO POWD: ORAL | Status: DC

## 2014-09-09 MED ORDER — PHENYLEPHRINE IN HARD FAT 0.25 % RE SUPP: 1.0000 | Freq: Two times a day (BID) | RECTAL | Status: DC

## 2014-09-09 NOTE — ED Notes (Signed)
Pt from home c/o constipation. She reports  Trying to disimpact self and bright red blood started coming from rectum.  Pt hypertensive in triage and anxious.

## 2014-09-09 NOTE — ED Notes (Signed)
Pt was informed of her high BP and was asked to not leave yet until EDprovider was made aware.  Pt stated she didn't have a PMD because she is still waiting for her [employer].  Upon returning to pt's room, she was found to be leaving and said she "left a note"  The note read..."I am aware of my BP 191/92 [name] [name] 09-09-14 RM#9."  As pt was leaving her new d/c papers were given to her and she was educated on the importance of a PMD follow-up and dangers of HTN.  Pt stated understanding and said she would f/u at the [facility] as instructed.

## 2014-09-09 NOTE — Discharge Instructions (Signed)
Fecal Impaction °A fecal impaction happens when there is a large, firm amount of stool (or feces) that cannot be passed. The impacted stool is usually in the rectum, which is the lowest part of the large bowel. The impacted stool can block the colon and cause significant problems. °CAUSES  °The longer stool stays in the rectum, the harder it gets. Anything that slows down your bowel movements can lead to fecal impaction, such as: °· Constipation. This can be a long-standing (chronic) problem or can happen suddenly (acute). °· Painful conditions of the rectum, such as hemorrhoids or anal fissures. The pain of these conditions can make you try to avoid having bowel movements. °· Narcotic pain-relieving medicines, such as methadone, morphine, or codeine. °· Not drinking enough fluids. °· Inactivity and bed rest over long periods of time. °· Diseases of the brain or nervous system that damage the nerves controlling the muscles of the intestines. °SIGNS AND SYMPTOMS  °· Lack of normal bowel movements or changes in bowel patterns. °· Sense of fullness in the rectum but unable to pass stool. °· Pain or cramps in the abdominal area (often after meals). °· Thin, watery discharge from the rectum. °DIAGNOSIS  °Your health care provider may suspect that you have a fecal impaction based on your symptoms and a physical exam. This will include an exam of your rectum. Sometimes X-rays or lab testing may be needed to confirm the diagnosis and to be sure there are no other problems.  °TREATMENT  °· Initially an impaction can be removed manually. Using a gloved finger, your health care provider can remove hard stool from your rectum. °· Medicine is sometimes needed. A suppository or enema can be given in the rectum to soften the stool, which can stimulate a bowel movement. Medicines can also be given by mouth (orally). °· Though rare, surgery may be needed if the colon has torn (perforated) due to blockage. °HOME CARE INSTRUCTIONS   °· Develop regular bowel habits. This could include getting in the habit of having a bowel movement after your morning cup of coffee or after eating. Be sure to allow yourself enough time on the toilet. °· Maintain a high-fiber diet. °· Drink enough fluids to keep your urine clear or pale yellow as directed by your health care provider. °· Exercise regularly. °· If you begin to get constipated, increase the amount of fiber in your diet. Eat plenty of fruits, vegetables, whole wheat breads, bran, oatmeal, and similar products. °· Take natural fiber laxatives or other laxatives only as directed by your health care provider. °SEEK MEDICAL CARE IF:  °· You have ongoing rectal pain. °· You require enemas or suppositories more than twice a week. °· You have rectal bleeding. °· You have continued problems, or you develop abdominal pain. °· You have thin, pencil-like stools. °SEEK IMMEDIATE MEDICAL CARE IF:  °You have black or tarry stools. °MAKE SURE YOU:  °· Understand these instructions. °· Will watch your condition. °· Will get help right away if you are not doing well or get worse. °Document Released: 03/29/2004 Document Revised: 04/27/2013 Document Reviewed: 01/11/2013 °[employer]® Patient Information ©2015 [employer]. This information is not intended to replace advice given to you by your health care provider. Make sure you discuss any questions you have with your health care provider. °Constipation °Constipation is when a person has fewer than three bowel movements a week, has difficulty having a bowel movement, or has stools that are dry, hard, or larger than normal.   normal. As people grow older, constipation is more common. If you try to fix constipation with medicines that make you have a bowel movement (laxatives), the problem may get worse. Long-term laxative use may cause the muscles of the colon to become weak. A low-fiber diet, not taking in enough fluids, and taking certain medicines may make constipation  worse.  CAUSES   Certain medicines, such as antidepressants, pain medicine, iron supplements, antacids, and water pills.   Certain diseases, such as diabetes, irritable bowel syndrome (IBS), thyroid disease, or depression.   Not drinking enough water.   Not eating enough fiber-rich foods.   Stress or travel.   Lack of physical activity or exercise.   Ignoring the urge to have a bowel movement.   Using laxatives too much.  SIGNS AND SYMPTOMS   Having fewer than three bowel movements a week.   Straining to have a bowel movement.   Having stools that are hard, dry, or larger than normal.   Feeling full or bloated.   Pain in the lower abdomen.   Not feeling relief after having a bowel movement.  DIAGNOSIS  Your health care provider will take a medical history and perform a physical exam. Further testing may be done for severe constipation. Some tests may include:  A barium enema X-ray to examine your rectum, colon, and, sometimes, your small intestine.   A sigmoidoscopy to examine your lower colon.   A colonoscopy to examine your entire colon. TREATMENT  Treatment will depend on the severity of your constipation and what is causing it. Some dietary treatments include drinking more fluids and eating more fiber-rich foods. Lifestyle treatments may include regular exercise. If these diet and lifestyle recommendations do not help, your health care provider may recommend taking over-the-counter laxative medicines to help you have bowel movements. Prescription medicines may be prescribed if over-the-counter medicines do not work.  HOME CARE INSTRUCTIONS   Eat foods that have a lot of fiber, such as fruits, vegetables, whole grains, and beans.  Limit foods high in fat and processed sugars, such as french fries, hamburgers, cookies, candies, and soda.   A fiber supplement may be added to your diet if you cannot get enough fiber from foods.   Drink enough fluids  to keep your urine clear or pale yellow.   Exercise regularly or as directed by your health care provider.   Go to the restroom when you have the urge to go. Do not hold it.   Only take over-the-counter or prescription medicines as directed by your health care provider. Do not take other medicines for constipation without talking to your health care provider first.  [place] IF:   You have bright red blood in your stool.   Your constipation lasts for more than 4 days or gets worse.   You have abdominal or rectal pain.   You have thin, pencil-like stools.   You have unexplained weight loss. MAKE SURE YOU:   Understand these instructions.  Will watch your condition.  Will get help right away if you are not doing well or get worse. Document Released: 04/04/2004 Document Revised: 07/12/2013 Document Reviewed: 04/18/2013 [facility] Patient Information 2015 [place], [place]. This information is not intended to replace advice given to you by your health care provider. Make sure you discuss any questions you have with your health care provider.  DASH Eating Plan DASH stands for "Dietary Approaches to Stop Hypertension." The DASH eating plan is a healthy eating plan that  has been shown to reduce high blood pressure (hypertension). Additional health benefits may include reducing the risk of type 2 diabetes mellitus, heart disease, and stroke. The DASH eating plan may also help with weight loss. WHAT DO I NEED TO KNOW ABOUT THE DASH EATING PLAN? For the DASH eating plan, you will follow these general guidelines:  Choose foods with a percent daily value for sodium of less than 5% (as listed on the food label).  Use salt-free seasonings or herbs instead of table salt or sea salt.  Check with your health care provider or pharmacist before using salt substitutes.  Eat lower-sodium products, often labeled as "lower sodium" or "no salt added."  Eat fresh foods.  Eat  more vegetables, fruits, and low-fat dairy products.  Choose whole grains. Look for the word "whole" as the first word in the ingredient list.  Choose fish and skinless chicken or [place] more often than red meat. Limit fish, poultry, and meat to 6 oz (170 g) each day.  Limit sweets, desserts, sugars, and sugary drinks.  Choose heart-healthy fats.  Limit cheese to 1 oz (28 g) per day.  Eat more home-cooked food and less restaurant, buffet, and fast food.  Limit fried foods.  Cook foods using methods other than frying.  Limit canned vegetables. If you do use them, rinse them well to decrease the sodium.  When eating at a restaurant, ask that your food be prepared with less salt, or no salt if possible. WHAT FOODS CAN I EAT? Seek help from a dietitian for individual calorie needs. Grains Whole grain or whole wheat bread. Brown rice. Whole grain or whole wheat pasta. Quinoa, bulgur, and whole grain cereals. Low-sodium cereals. Corn or whole wheat flour tortillas. Whole grain cornbread. Whole grain crackers. Low-sodium crackers. Vegetables Fresh or frozen vegetables (raw, steamed, roasted, or grilled). Low-sodium or reduced-sodium tomato and vegetable juices. Low-sodium or reduced-sodium tomato sauce and paste. Low-sodium or reduced-sodium canned vegetables.  Fruits All fresh, canned (in natural juice), or frozen fruits. Meat and Other Protein Products Ground beef (85% or leaner), grass-fed beef, or beef trimmed of fat. Skinless chicken or [place]. Ground chicken or [place]. Pork trimmed of fat. All fish and seafood. Eggs. Dried beans, peas, or lentils. Unsalted nuts and seeds. Unsalted canned beans. Dairy Low-fat dairy products, such as skim or 1% milk, 2% or reduced-fat cheeses, low-fat ricotta or cottage cheese, or plain low-fat yogurt. Low-sodium or reduced-sodium cheeses. Fats and Oils Tub margarines without trans fats. Light or reduced-fat mayonnaise and salad dressings (reduced  sodium). Avocado. Safflower, olive, or canola oils. Natural peanut or almond butter. Other Unsalted popcorn and pretzels. The items listed above may not be a complete list of recommended foods or beverages. Contact your dietitian for more options. WHAT FOODS ARE NOT RECOMMENDED? Grains White bread. White pasta. White rice. Refined cornbread. Bagels and croissants. Crackers that contain trans fat. Vegetables Creamed or fried vegetables. Vegetables in a cheese sauce. Regular canned vegetables. Regular canned tomato sauce and paste. Regular tomato and vegetable juices. Fruits Dried fruits. Canned fruit in light or heavy syrup. Fruit juice. Meat and Other Protein Products Fatty cuts of meat. Ribs, chicken wings, bacon, sausage, bologna, salami, chitterlings, fatback, hot dogs, bratwurst, and packaged luncheon meats. Salted nuts and seeds. Canned beans with salt. Dairy Whole or 2% milk, cream, half-and-half, and cream cheese. Whole-fat or sweetened yogurt. Full-fat cheeses or blue cheese. Nondairy creamers and whipped toppings. Processed cheese, cheese spreads, or cheese curds. Condiments Onion and garlic  salt, seasoned salt, table salt, and sea salt. Canned and packaged gravies. Worcestershire sauce. Tartar sauce. Barbecue sauce. Teriyaki sauce. Soy sauce, including reduced sodium. Steak sauce. Fish sauce. Oyster sauce. Cocktail sauce. Horseradish. Ketchup and mustard. Meat flavorings and tenderizers. Bouillon cubes. Hot sauce. Tabasco sauce. Marinades. Taco seasonings. Relishes. Fats and Oils Butter, stick margarine, lard, shortening, ghee, and bacon fat. Coconut, palm kernel, or palm oils. Regular salad dressings. Other Pickles and olives. Salted popcorn and pretzels. The items listed above may not be a complete list of foods and beverages to avoid. Contact your dietitian for more information. WHERE CAN I FIND MORE INFORMATION? National Heart, Lung, and Blood Institute:  travelstabloid.com Document Released: 06/26/2011 Document Revised: 11/21/2013 Document Reviewed: 05/11/2013 [facility] Patient Information 2015 [place], [place]. This information is not intended to replace advice given to you by your health care provider. Make sure you discuss any questions you have with your health care provider.  Hypertension Hypertension, commonly called high blood pressure, is when the force of blood pumping through your arteries is too strong. Your arteries are the blood vessels that carry blood from your heart throughout your body. A blood pressure reading consists of a higher number over a lower number, such as 110/72. The higher number (systolic) is the pressure inside your arteries when your heart pumps. The lower number (diastolic) is the pressure inside your arteries when your heart relaxes. Ideally you want your blood pressure below 120/80. Hypertension forces your heart to work harder to pump blood. Your arteries may become narrow or stiff. Having hypertension puts you at risk for heart disease, stroke, and other problems.  RISK FACTORS Some risk factors for high blood pressure are controllable. Others are not.  Risk factors you cannot control include:   Race. You may be at higher risk if you are African American.  Age. Risk increases with age.  Gender. Men are at higher risk than women before age 37 years. After age 23, women are at higher risk than men. Risk factors you can control include:  Not getting enough exercise or physical activity.  Being overweight.  Getting too much fat, sugar, calories, or salt in your diet.  Drinking too much alcohol. SIGNS AND SYMPTOMS Hypertension does not usually cause signs or symptoms. Extremely high blood pressure (hypertensive crisis) may cause headache, anxiety, shortness of breath, and nosebleed. DIAGNOSIS  To check if you have hypertension, your health care provider will measure your  blood pressure while you are seated, with your arm held at the level of your heart. It should be measured at least twice using the same arm. Certain conditions can cause a difference in blood pressure between your right and left arms. A blood pressure reading that is higher than normal on one occasion does not mean that you need treatment. If one blood pressure reading is high, ask your health care provider about having it checked again. TREATMENT  Treating high blood pressure includes making lifestyle changes and possibly taking medicine. Living a healthy lifestyle can help lower high blood pressure. You may need to change some of your habits. Lifestyle changes may include:  Following the DASH diet. This diet is high in fruits, vegetables, and whole grains. It is low in salt, red meat, and added sugars.  Getting at least 2 hours of brisk physical activity every week.  Losing weight if necessary.  Not smoking.  Limiting alcoholic beverages.  Learning ways to reduce stress. If lifestyle changes are not enough to get your blood pressure  under control, your health care provider may prescribe medicine. You may need to take more than one. Work closely with your health care provider to understand the risks and benefits. HOME CARE INSTRUCTIONS  Have your blood pressure rechecked as directed by your health care provider.   Take medicines only as directed by your health care provider. Follow the directions carefully. Blood pressure medicines must be taken as prescribed. The medicine does not work as well when you skip doses. Skipping doses also puts you at risk for problems.   Do not smoke.   Monitor your blood pressure at home as directed by your health care provider. SEEK MEDICAL CARE IF:   You think you are having a reaction to medicines taken.  You have recurrent headaches or feel dizzy.  You have swelling in your ankles.  You have trouble with your vision. SEEK IMMEDIATE MEDICAL  CARE IF:  You develop a severe headache or confusion.  You have unusual weakness, numbness, or feel faint.  You have severe chest or abdominal pain.  You vomit repeatedly.  You have trouble breathing. MAKE SURE YOU:   Understand these instructions.  Will watch your condition.  Will get help right away if you are not doing well or get worse. Document Released: 07/07/2005 Document Revised: 11/21/2013 Document Reviewed: 04/29/2013 [facility] Patient Information 2015 [place], [place]. This information is not intended to replace advice given to you by your health care provider. Make sure you discuss any questions you have with your health care provider.

## 2014-09-09 NOTE — ED Provider Notes (Signed)
CSN: [id number]     Arrival date & time 09/09/14  1000 History   First MD Initiated Contact with Patient 09/09/14 1018     Chief Complaint  Patient presents with  . Constipation  . Rectal Bleeding     (Consider location/radiation/quality/duration/timing/severity/associated sxs/prior Treatment) Patient is a 54 y.o. female presenting with constipation and hematochezia.  Constipation Severity:  Severe Time since last bowel movement:  3 days Timing:  Constant Progression:  Worsening Chronicity:  New Context: not dehydration, not dietary changes, not medication, not narcotics and not stress   Stool description: blood with straining after attempted self disimpaction. Relieved by:  Nothing Worsened by:  Nothing tried Ineffective treatments:  Laxatives Associated symptoms: abdominal pain and hematochezia   Associated symptoms: no anorexia, no back pain, no diarrhea, no dysuria, no fever, no flatus, no nausea, no urinary retention and no vomiting   Associated symptoms comment:  Severe rectal urgency Risk factors: obesity   Risk factors: no change in medication, no hx of abdominal surgery, no recent antibiotic use, no recent illness, no recent surgery and no recent travel   Rectal Bleeding Quality:  Mucoid and bright red Amount:  Moderate Duration:  3 hours Timing:  Constant Progression:  Unchanged Chronicity:  New Context: anal penetration, constipation, defecation, hemorrhoids and rectal pain   Associated symptoms: abdominal pain   Associated symptoms: no dizziness, no epistaxis, no fever, no hematemesis, no light-headedness, no loss of consciousness, no recent illness and no vomiting   Risk factors: no anticoagulant use, no hx of colorectal cancer, no hx of colorectal surgery, no hx of IBD, no liver disease and no NSAID use     History reviewed. No pertinent past medical history. History reviewed. No pertinent past surgical history. No family history on file. History  Substance  Use Topics  . Smoking status: Never Smoker   . Smokeless tobacco: Not on file  . Alcohol Use: No   OB History    No data available     Review of Systems  Constitutional: Negative for fever and chills.  HENT: Negative for nosebleeds and trouble swallowing.   Respiratory: Negative for shortness of breath.   Cardiovascular: Negative for chest pain.  Gastrointestinal: Positive for abdominal pain, constipation, blood in stool, hematochezia, abdominal distention, anal bleeding and rectal pain. Negative for nausea, vomiting, diarrhea, anorexia, flatus and hematemesis.  Genitourinary: Negative for dysuria and hematuria.  Musculoskeletal: Negative for myalgias, back pain and arthralgias.  Skin: Negative for rash.  Neurological: Negative for dizziness, loss of consciousness, light-headedness and numbness.  Hematological: Does not bruise/bleed easily.  All other systems reviewed and are negative.     Allergies  Tetracyclines & related  Home Medications   Prior to Admission medications   Not on File   BP 206/119 mmHg  Pulse 110  Temp(Src) 98.5 F (36.9 C) (Oral)  Resp 18  SpO2 97% Physical Exam  Constitutional: She is oriented to person, place, and time. She appears well-developed and well-nourished. No distress.  Patient appears very uncomfortable. She is unable to find a comfortable position.  HENT:  Head: Normocephalic and atraumatic.  Eyes: Conjunctivae are normal. No scleral icterus.  Neck: Normal range of motion.  Cardiovascular: Normal rate, regular rhythm and normal heart sounds.  Exam reveals no gallop and no friction rub.   No murmur heard. Pulmonary/Chest: Effort normal and breath sounds normal. No respiratory distress.  Abdominal: Soft. Bowel sounds are normal. She exhibits no distension and no mass. There is no tenderness. There  is no guarding.  Genitourinary: Rectal exam shows internal hemorrhoid, tenderness and anal tone abnormal.  Mucoid, BRBPR assiociated with  Large Hard impacted stool just inside the internal anal sphincter.  Large, soft internal hemorrhoids present.  Neurological: She is alert and oriented to person, place, and time.  Skin: Skin is warm and dry. She is not diaphoretic.  Nursing note and vitals reviewed.   ED Course  Fecal disimpaction Date/Time: 09/09/2014 11:02 AM Performed by: Margarita Mail Authorized by: Margarita Mail Consent: Verbal consent obtained. Risks and benefits: risks, benefits and alternatives were discussed Patient identity confirmed: verbally with patient Time out: Immediately prior to procedure a "time out" was called to verify the correct patient, procedure, equipment, support staff and site/side marked as required. Preparation: Patient was prepped and draped in the usual sterile fashion. Local anesthesia used: yes Local anesthetic: topical anesthetic Anesthetic total: 4 ml Patient sedated: no Patient tolerance: Patient tolerated the procedure well with no immediate complications   (including critical care time) Labs Review Labs Reviewed - No data to display  Imaging Review No results found.   EKG Interpretation None      MDM   Final diagnoses:  Fecal impaction in rectum  Essential hypertension    Patient with fecal impaction. Moderate relief with fecal disimpaction. The patient bleeding is likely secondary to large palpable internal hemorrhoids and constipation. Along with digital trauma from attempted self disimpaction at home. We'll attempt a soapsuds enema.   11:35 AM BP 206/119 mmHg  Pulse 110  Temp(Src) 98.5 F (36.9 C) (Oral)  Resp 18  SpO2 97% Patient has had a large bowel movement after soapsuds enema. She does not have a history of hypertension, however, on the monitor. Her blood pressure has significantly declined. Patient will be discharged home with Preparation H suppositories, Colace and MiraLAX. Encouraged healthy diet, exercise and crit of water intake.  Filed  Vitals:   09/09/14 1006 09/09/14 1152 09/09/14 1209  BP: 206/119  193/111  Pulse: 110  95  Temp: 98.5 F (36.9 C) 98.2 F (36.8 C) 97.4 F (36.3 C)  TempSrc: Oral Oral Oral  Resp: 18  16  SpO2: 97%  96%    Patient with continued htn despite significant relief o her pain and discomfort. NO CP or Neuro complaint.s Will refer to [facility] for management. Otherwise safe for discharge. I discussed return precautions. i doubt any other cause of her sxs today such as rectal carcinoma, proctitis, or obstruction. I have discussed the patient's need to follow with a pcp and GI. The patient appears reasonably screened and/or stabilized for discharge and I doubt any other medical condition or other [facility] requiring further screening, evaluation, or treatment in the ED at this time prior to discharge.   Margarita Mail, PA-C 09/11/14 [place], MD 09/15/14 [phone]

## 2015-03-27 LAB — BASIC METABOLIC PANEL
BUN: 6 mg/dL (ref 4–21)
Creatinine: 0.6 mg/dL (ref 0.5–1.1)
GLUCOSE: 82 mg/dL
POTASSIUM: 4 mmol/L (ref 3.4–5.3)
Sodium: 137 mmol/L (ref 137–147)

## 2015-04-14 DIAGNOSIS — I10 Essential (primary) hypertension: Secondary | ICD-10-CM | POA: Insufficient documentation

## 2015-04-14 DIAGNOSIS — J302 Other seasonal allergic rhinitis: Secondary | ICD-10-CM

## 2015-04-24 ENCOUNTER — Ambulatory Visit: Payer: Self-pay | Admitting: Internal Medicine

## 2015-06-02 ENCOUNTER — Emergency Department (HOSPITAL_COMMUNITY)
Admission: EM | Admit: 2015-06-02 | Discharge: 2015-06-02 | Disposition: A | Discharge status: Home / Self Care | Payer: PRIVATE HEALTH INSURANCE | Attending: Emergency Medicine | Admitting: Emergency Medicine

## 2015-06-02 ENCOUNTER — Encounter (HOSPITAL_COMMUNITY): Payer: Self-pay | Admitting: Emergency Medicine

## 2015-06-02 DIAGNOSIS — Z8719 Personal history of other diseases of the digestive system: Secondary | ICD-10-CM | POA: Insufficient documentation

## 2015-06-02 DIAGNOSIS — Z7982 Long term (current) use of aspirin: Secondary | ICD-10-CM | POA: Insufficient documentation

## 2015-06-02 DIAGNOSIS — Z9889 Other specified postprocedural states: Secondary | ICD-10-CM | POA: Insufficient documentation

## 2015-06-02 DIAGNOSIS — I10 Essential (primary) hypertension: Secondary | ICD-10-CM

## 2015-06-02 DIAGNOSIS — Z79899 Other long term (current) drug therapy: Secondary | ICD-10-CM | POA: Insufficient documentation

## 2015-06-02 DIAGNOSIS — G253 Myoclonus: Secondary | ICD-10-CM | POA: Insufficient documentation

## 2015-06-02 DIAGNOSIS — G8929 Other chronic pain: Secondary | ICD-10-CM | POA: Insufficient documentation

## 2015-06-02 DIAGNOSIS — M542 Cervicalgia: Secondary | ICD-10-CM | POA: Insufficient documentation

## 2015-06-02 LAB — CBC WITH DIFFERENTIAL/PLATELET
BASOS PCT: 1 %
Basophils Absolute: 0 10*3/uL (ref 0.0–0.1)
EOS ABS: 0 10*3/uL (ref 0.0–0.7)
EOS PCT: 1 %
HCT: 37.6 % (ref 36.0–46.0)
Hemoglobin: 12.4 g/dL (ref 12.0–15.0)
LYMPHS ABS: 2.2 10*3/uL (ref 0.7–4.0)
Lymphocytes Relative: 37 %
MCH: 30.9 pg (ref 26.0–34.0)
MCHC: 33 g/dL (ref 30.0–36.0)
MCV: 93.8 fL (ref 78.0–100.0)
MONOS PCT: 7 %
Monocytes Absolute: 0.4 10*3/uL (ref 0.1–1.0)
Neutro Abs: 3.3 10*3/uL (ref 1.7–7.7)
Neutrophils Relative %: 54 %
PLATELETS: 255 10*3/uL (ref 150–400)
RBC: 4.01 MIL/uL (ref 3.87–5.11)
RDW: 13.5 % (ref 11.5–15.5)
WBC: 6 10*3/uL (ref 4.0–10.5)

## 2015-06-02 LAB — COMPREHENSIVE METABOLIC PANEL
ALK PHOS: 67 U/L (ref 38–126)
ALT: 23 U/L (ref 14–54)
AST: 24 U/L (ref 15–41)
Albumin: 3.9 g/dL (ref 3.5–5.0)
Anion gap: 11 (ref 5–15)
BUN: 7 mg/dL (ref 6–20)
CALCIUM: 8.8 mg/dL — AB (ref 8.9–10.3)
CHLORIDE: 107 mmol/L (ref 101–111)
CO2: 24 mmol/L (ref 22–32)
CREATININE: 0.53 mg/dL (ref 0.44–1.00)
GFR calc Af Amer: >60 mL/min (ref >60)
Glucose, Bld: 102 mg/dL — ABNORMAL HIGH (ref 65–99)
Potassium: 3.5 mmol/L (ref 3.5–5.1)
Sodium: 142 mmol/L (ref 135–145)
Total Bilirubin: 0.3 mg/dL (ref 0.3–1.2)
Total Protein: 7.4 g/dL (ref 6.5–8.1)

## 2015-06-02 LAB — MAGNESIUM: Magnesium: 2 mg/dL (ref 1.7–2.4)

## 2015-06-02 LAB — CBG MONITORING, ED: GLUCOSE-CAPILLARY: 97 mg/dL (ref 65–99)

## 2015-06-02 MED ORDER — IBUPROFEN 200 MG PO TABS
600.0000 mg | ORAL_TABLET | Freq: Once | ORAL | Status: AC
  Administered 2015-06-02: 600 mg via ORAL
  Filled 2015-06-02: qty 3

## 2015-06-02 MED ORDER — LISINOPRIL-HYDROCHLOROTHIAZIDE 10-12.5 MG PO TABS
1.0000 | ORAL_TABLET | Freq: Every day | ORAL | Status: DC
Start: 2015-06-02 — End: 2017-10-06

## 2015-06-02 MED ORDER — KETOROLAC TROMETHAMINE 30 MG/ML IJ SOLN
30.0000 mg | Freq: Once | INTRAMUSCULAR | Status: DC
  Filled 2015-06-02: qty 1

## 2015-06-02 MED ORDER — CHLORDIAZEPOXIDE HCL 25 MG PO CAPS: ORAL_CAPSULE | ORAL | Status: DC

## 2015-06-02 MED ORDER — VITAMIN B-1 100 MG PO TABS
100.0000 mg | ORAL_TABLET | Freq: Once | ORAL | Status: AC
  Administered 2015-06-02: 100 mg via ORAL
  Filled 2015-06-02: qty 1

## 2015-06-02 NOTE — Discharge Instructions (Signed)
°Emergency Department Resource Guide °1) Find a Doctor and Pay Out of Pocket °Although you won't have to find out who is covered by your insurance plan, it is a good idea to ask around and get recommendations. You will then need to call the office and see if the doctor you have chosen will accept you as a new patient and what types of options they offer for patients who are self-pay. Some doctors offer discounts or will set up payment plans for their patients who do not have insurance, but you will need to ask so you aren't surprised when you get to your appointment. ° °2) Contact Your Local Health Department °Not all health departments have doctors that can see patients for sick visits, but many do, so it is worth a call to see if yours does. If you don't know where your local health department is, you can check in your phone book. The CDC also has a tool to help you locate your state's health department, and many state websites also have listings of all of their local health departments. ° °3) Find a Walk-in Clinic °If your illness is not likely to be very severe or complicated, you may want to try a walk in clinic. These are popping up all over the country in pharmacies, drugstores, and shopping centers. They're usually staffed by nurse practitioners or physician assistants that have been trained to treat common illnesses and complaints. They're usually fairly quick and inexpensive. However, if you have serious medical issues or chronic medical problems, these are probably not your best option. ° °No Primary Care Doctor: °- Call [facility] at  832-8000 - they can help you locate a primary care doctor that  accepts your insurance, provides certain services, etc. °- Physician Referral Service- 1-800-533-3463 ° °Chronic Pain Problems: °Organization         Address  Phone   Notes  °[facility]  (336) 297-2271 Patients need to be referred by their primary care doctor.  ° °Medication  Assistance: °Organization         Address  Phone   Notes  °[place] [facility] [address] °[place], [place] [address] (336) 641-8030 --Must be a resident of [place] °-- Must have NO insurance coverage whatsoever (no Medicaid/ Medicare, etc.) °-- The pt. MUST have a primary care doctor that directs their care regularly and follows them in the community °  °[facility]  (866) 331-1348   °[employer]  (888) 892-1162   ° °Agencies that provide inexpensive medical care: °Organization         Address  Phone   Notes  °[facility]  (336) 832-8035   °[facility]    (336) 832-7272   °[facility] [address] °[place], [place] [address] (336) 832-4777   °[facility] [address], °[place] (336) 271-4999   °[facility]    (336) 373-0678   °[facility]    (336) 272-1050   °[facility] ° [address], [place] Phone:  (336) 832-4444, Fax:  (336) 832-4440 Hours of Operation:  9 am - 6 pm, M-F.  Also accepts Medicaid/Medicare and self-pay.  °[facility] ° [address],  Phone: (336) 832-3150, Fax: (336) 832-3151. Hours of Operation:  8:30 am - 5:30 pm, M-F.  Also accepts Medicaid and self-pay.  °[facility] [place] [address], [place] Phone: (336) 878-6027   °[facility] [address], [place], [place] (336)723-1848, Ext. 123 Mondays & Thursdays: 7-9 AM.  First 15 patients are seen on a first come, first serve basis. °  ° °Medicaid-accepting [place] Providers: ° °Organization         Address  Phone   Notes  °[facility] [address], [place] (336) 641-2100 Also accepts self-pay patients.  °[facility] [address], [place] ° (336) 856-9996   °[facility] [address], [place]  (336) 288-8857   °[facility] [address] [place] [address], [place] (336) 299-7000   °Veita Bland [address], [place]  ° (336) 373-1557 Only accepts [place] Access Medicaid patients after they have their name applied to their card.  ° °Self-Pay (no insurance) in [place]: ° °Organization         Address  Phone   Notes  °Sickle Cell Patients, [facility] [address], [place] (336) 832-1970   °[facility] [address], [place] (336) 832-4400   °[facility] [place] ° [address], [place] (336) 992-4800   °[facility]/Dr. Osei-Bonsu ° [address] [place] [address], [place] or [address], [place] (336) 841-8500 Phone number for both [place] and [place] locations is the same.  °[facility] [address], [place] (336) 299-0000   °[facility] [address] [place] [address], [place] or [address] (336) 852-7530 °(336) 878-2260   °[facility] [address], [place] (336) 350-1642, phone; (336) 294-5005, fax Sees patients 1st and 3rd Saturday of every month.  Must not qualify for public or private insurance (i.e. Medicaid, Medicare, [place] Health Choice, Veterans' Benefits) • Household income should be no more than 200% of the poverty level •The clinic cannot treat you if you are pregnant or think you are pregnant • Sexually transmitted diseases are not treated at the clinic.  ° ° °Dental Care: °Organization         Address  Phone  Notes  °[place] [facility] [address], [place] (336) 641-6152 Accepts children up to age 21 who are enrolled in Medicaid or [place] Health Choice; pregnant women with a Medicaid card; and children who have applied for Medicaid or [place] Health Choice, but were declined, whose parents can pay a reduced fee at time of service.  °[place]  [facility] [place]  [address], [place] (336) 641-7733 Accepts children up to age 21 who are enrolled in Medicaid or [place] Health Choice; pregnant women with a Medicaid card; and children who have applied for Medicaid or  Health Choice, but were declined, whose parents can pay a reduced fee at time of service.  °[facility] ° [address], [place] (336) 641-4533 Patients are seen by appointment only. Walk-ins are not accepted. [facility] will see patients 18 years of age and older. °Monday - Tuesday (8am-5pm) °Most Wednesdays (8:30-5pm) °$30 per visit, cash only  °[facility] ° [address], [place] (336) 641-4533 Patients are seen by appointment only. Walk-ins are not accepted. [facility] will see patients 18 years of age and older. °One   Wednesday Evening (Monthly: Volunteer Based).  $30 per visit, cash only  °[facility]  (919) 537-3737 for adults; Children under age 4, call Graduate Pediatric Dentistry at (919) [phone]. Children aged 4-14, please call (919) [phone] to request a pediatric application. ° Dental services are provided in all areas of dental care including fillings, crowns and bridges, complete and partial dentures, implants, gum treatment, root canals, and extractions. Preventive care is also provided. Treatment is provided to both adults and children. °Patients are selected via a lottery and there is often a waiting list. °  °[facility] [address], °[place] ° (336) 763-8833 [email or web address] °  °[facility] [address], [place], [place] (336)723-1848, Ext. 123 Second and Fourth Thursday of each month, opens at 6:30 AM; Clinic ends at 9 AM.  Patients are seen on a first-come first-served basis, and a limited number are seen during each clinic.  ° °[facility] ° [address], [place], [place] (336) 723-7904    Eligibility Requirements °You must have lived in [place], [place], or [place] counties for at least the last three months. °  You cannot be eligible for state or federal sponsored healthcare insurance, including Veterans Administration, Medicaid, or Medicare. °  You generally cannot be eligible for healthcare insurance through your employer.  °  How to apply: °Eligibility screenings are held every Tuesday and Wednesday afternoon from 1:00 pm until 4:00 pm. You do not need an appointment for the interview!  °[facility] [address], [place], [place] 336-631-2330   °[facility]  336-342-8273   °[facility]  336-703-3100   °[facility]  336-570-6415   ° °Behavioral Health Resources in the Community: °Intensive Outpatient Programs °Organization         Address  Phone  Notes  °[facility] [address], [place], [place] 336-878-6098   °[facility] [address], [place], [place] 336-832-9800   °[facility] [address], [place], [place] ° 336-882-2125   °[facility] [address],  °[place], [place] 1-800-853-5163 or 336-641-4981   °Substance Abuse Resources °Organization         Address  Phone  Notes  °[facility]  336-882-2125   °[facility]  336-784-9470   °[facility]  336-285-9073   °[facility]  336-845-3988   °Residential & Outpatient Substance Abuse Program  1-800-659-3381   °Psychological Services °Organization         Address  Phone  Notes  °[facility]  336- 832-9600   °[facility]  336- 378-7881   °[facility] [address], [place] 1-800-853-5163 or 336-641-4981   ° °Mobile Crisis Teams °Organization         Address  Phone  Notes  °[facility], Mobile Crisis Care Unit  1-877-626-1772   °Assertive °[facility] ° [address]  [place], [place] 336-834-9664   °Sharon DeEsch [address] °[place] 336-554-5454   ° °Self-Help/Support Groups °Organization         Address  Phone             Notes  °[facility]  - variety of support groups  336- 373-1402 Call for more information  °Narcotics Anonymous (NA), [facility] [address], °[place]   2 meetings at this location  ° °  Residential Treatment Programs °Organization         Address  Phone  Notes  °[facility] [address],    °[place]  1-866-801-8205   °[facility] ° [address], [place], [place] 704-293-8524   °[facility] [address], [place] 336-845-3988 Admissions: 8am-3pm M-F  °[facility] [address]    °[place], [place] 336-841-1104   °[facility] [address], [place], [place] 336-379-7146   °[facility] [address]  °[place], [place] 336-285-9073   °[facility] Intensive Outpatient [address], [place], [place] 336-852-3033   °[facility] [address]  °[place], [place] 1-877-615-2722 or 336-784-9470   °[facility] [address] [place], [place] 336-227-7417 Accepts Medicaid  °[facility] [address]  °[place] 1-800-659-3381 Substance Abuse/Addiction Treatment  ° °[place] Behavioral Health Resources °Organization         Address  Phone  Notes  °[facility]  (888) 581-9988   °Julie Brannon, PhD [address] [place], [place]   (336) 349-5553 or (336) 951-0000   °[facility]   [address] °[place], [place] (336) 349-4454   °[facility] [address], [place], [place] (336) 342-8316 Insurance/Medicaid/sponsorship through [facility]  °[facility] [address]                                    [place], [place] (336) 342-8316 Therapy/tele-psych/case    °[facility] [address]  ° [place], [place] (336) 349-2233    °Dr. Arfeen  (336) 349-4544   °[facility] [place]  [employer] [place] [facility] 1) [address], [place] °2) [address], [place] °3)  [address], [place] (336) 349-3220 °(336) 342-7768 ° °(336) 342-8140   °[place] [facility] (336) 342-1394 or (336) 342-3537 (After Hours)    ° ° °

## 2015-06-02 NOTE — ED Notes (Signed)
Per EMS, patient from home.  Patient states she has weakness today and not feeling herself.  She has not taken her blood measure medication in 2 weeks due to lack of funds.  She denies headache, dizzy spells, no n/v, or diarrhea.  CBG: 160  BP:205/104 98% on room air Sinus tach 108

## 2015-06-02 NOTE — ED Notes (Signed)
Bed: [id number] Expected date:  Expected time:  Means of arrival:  Comments: Hold for A

## 2015-06-02 NOTE — ED Provider Notes (Signed)
CSN: [id number]     Arrival date & time 06/02/15  1812 History   First MD Initiated Contact with Patient 06/02/15 1816     Chief Complaint  Patient presents with  . Weakness  . Hypertension     (Consider location/radiation/quality/duration/timing/severity/associated sxs/prior Treatment) HPI  54 year old female presents with multiple episodes of "jerking". This started several hours prior to arrival. It seems like her whole body jerks for a second or 2 and then stops. There is no full body shaking or tonic-clonic activity. She is awake during these. They do not hurt. Denies any headaches, or focal weakness/numbness. She does have chronic neck pain for over one month. Denies any fevers or chills. No new medicines. She called EMS because these jerking episodes scared her and they noted her to be hypertensive. She states she has not taken her hypertension medicine in several months due to affordability. She does want to go back on them. She has a PCP but has not seen them in several months. Of note patient does state that she drinks multiple years per day, estimating about 10, last drank earlier today.   Past Medical History  Diagnosis Date  . Hypertension   . Seasonal allergies   . Dental decay    Past Surgical History  Procedure Laterality Date  . Umbilical hernia repair N/A 2005  . Colonoscopy N/A 2002    Performed secondary to mother's diagnosis of colon CA at age 32  . Cardiac surgery N/A 1963    Repair of PFO  . Nasal sinus surgery Bilateral 1984  . Ovarian cyst removal Left 1990  . Bartholin cyst marsupialization Left 1997   Family History  Problem Relation Age of Onset  . Colon cancer Mother 36  . Hypertension Mother   . Hypertension Father   . Cerebral aneurysm Sister   . Goiter Sister    Social History  Substance Use Topics  . Smoking status: Never Smoker   . Smokeless tobacco: None  . Alcohol Use: Yes     Comment: 48 oz malt liquor when drinks, but not daily.   Difficult to pin down   OB History    No data available     Review of Systems  Constitutional: Negative for fever.  Eyes: Negative for visual disturbance.  Respiratory: Negative for shortness of breath.   Cardiovascular: Negative for chest pain.  Gastrointestinal: Negative for vomiting and abdominal pain.  Musculoskeletal: Positive for neck pain.  Neurological: Positive for tremors. Negative for weakness, numbness and headaches.  All other systems reviewed and are negative.     Allergies  Tetracyclines & related  Home Medications   Prior to Admission medications   Medication Sig Start Date End Date Taking? Authorizing Provider  aspirin 325 MG tablet Take 325-650 mg by mouth every 6 (six) hours as needed for mild pain, moderate pain or headache.   Yes Historical Provider, MD  diphenhydramine-acetaminophen (TYLENOL PM) 25-500 MG TABS tablet Take 1 tablet by mouth at bedtime as needed (sleep).   Yes Historical Provider, MD  lisinopril-hydrochlorothiazide (PRINZIDE,ZESTORETIC) 10-12.5 MG per tablet Take 1 tablet by mouth daily. In morning   Yes Historical Provider, MD  Docusate Sodium (DSS) 100 MG CAPS Take 100 mg by mouth 2 (two) times daily. Patient not taking: Reported on 06/02/2015 09/09/14   Margarita Mail, PA-C  polyethylene glycol powder (GLYCOLAX/MIRALAX) powder 1 capful up to 3 times daily with 20 oz of water. Patient not taking: Reported on 06/02/2015 09/09/14   Margarita Mail, PA-C  BP 176/101 mmHg  Pulse 102  Temp(Src) 98.2 F (36.8 C) (Oral)  Resp 19  Ht 5\' 3"  (1.6 m)  Wt 200 lb (90.719 kg)  BMI 35.44 kg/m2  SpO2 96% Physical Exam  Constitutional: She is oriented to person, place, and time. She appears well-developed and well-nourished.  HENT:  Head: Normocephalic and atraumatic.  Right Ear: External ear normal.  Left Ear: External ear normal.  Nose: Nose normal.  Eyes: EOM are normal. Pupils are equal, round, and reactive to light. Right eye exhibits no  discharge. Left eye exhibits no discharge.  Neck: Neck supple.  Mild neck tenderness, muscular  Cardiovascular: Normal rate, regular rhythm and normal heart sounds.   Pulmonary/Chest: Effort normal and breath sounds normal.  Abdominal: Soft. She exhibits no distension. There is no tenderness.  Neurological: She is alert and oriented to person, place, and time.  CN 2-12 grossly intact. 5/5 strength in all 4 extremities. Normal finger to nose. No obvious jerking, tremors or seizure like activity  Skin: Skin is warm and dry.  Nursing note and vitals reviewed.   ED Course  Procedures (including critical care time) Labs Review Labs Reviewed  COMPREHENSIVE METABOLIC PANEL - Abnormal; Notable for the following:    Glucose, Bld 102 (*)    Calcium 8.8 (*)    All other components within normal limits  CBC WITH DIFFERENTIAL/PLATELET  MAGNESIUM  ETHANOL  CBG MONITORING, ED    Imaging Review No results found. I have personally reviewed and evaluated these images and lab results as part of my medical decision-making.   EKG Interpretation   Date/Time:  Saturday June 02 2015 19:14:07 EST Ventricular Rate:  89 PR Interval:  142 QRS Duration: 99 QT Interval:  384 QTC Calculation: 467 R Axis:   99 Text Interpretation:  Sinus rhythm Borderline right axis deviation  Probable LVH with secondary repol abnrm No significant change since 2012  Confirmed by Buzz Axel  MD, Paityn Balsam ([id number]) on 06/02/2015 11:12:16 PM      MDM   Final diagnoses:  Essential hypertension  Myoclonic jerking    Patient symptoms are most consistent with myoclonic jerking. I have not seen any of this activity in ER and she states these are becoming much less frequent. Not c/w seizures. No signs of stroke or acute neuro deficit. She has a history of daily ETOH abuse but did drink today and if she were withdrawing I think that it would be worse, not better. She does want to get off alcohol, will give outpatient  resources and start on librium taper. Will restart on her anti-hypertensives. No signs of hypertensive emergency. F/u with PCP and neuro.    Sherwood Gambler, MD 06/02/15 [phone]

## 2015-06-02 NOTE — ED Notes (Signed)
RN Attempting to draw labs

## 2015-06-19 ENCOUNTER — Ambulatory Visit (INDEPENDENT_AMBULATORY_CARE_PROVIDER_SITE_OTHER): Payer: Self-pay | Admitting: Internal Medicine

## 2015-06-19 ENCOUNTER — Encounter: Payer: Self-pay | Admitting: Internal Medicine

## 2015-06-19 VITALS — BP 118/80 | HR 72 | Resp 16 | Ht 64.0 in | Wt 195.5 lb

## 2015-06-19 DIAGNOSIS — I10 Essential (primary) hypertension: Secondary | ICD-10-CM

## 2015-06-19 DIAGNOSIS — J3089 Other allergic rhinitis: Secondary | ICD-10-CM

## 2015-06-19 DIAGNOSIS — M542 Cervicalgia: Secondary | ICD-10-CM

## 2015-06-19 DIAGNOSIS — F1010 Alcohol abuse, uncomplicated: Secondary | ICD-10-CM

## 2015-06-19 DIAGNOSIS — R42 Dizziness and giddiness: Secondary | ICD-10-CM

## 2015-06-19 NOTE — Patient Instructions (Addendum)
Take ibuprofen 200 mg 2-4 tabs with food every 6 hours as needed for neck muscle pain May use have a capsule of Librium that you have left over--sprinkle on apple sauce--about 1 hour before going to bed. Warm pack neck for 20 minutes and do stretches twice daily--when you wake and when you go to bed. Once you are done using Librium at bedtime, start Fexofenadine 180 mg for allergies once daily--take the same time every day.  This is what we talked about using for allergies previously

## 2015-06-19 NOTE — Progress Notes (Signed)
Subjective:    Patient ID: [name] [name], female    DOB: 08-31-1960, 54 y.o.   MRN: [id number]  HPI  Pt. Was dropped off at the clinic today and asked to be seen.  Just doesn't feel well.  See ED visit from 06/02/2015:  Pt. States she woke up feeling poorly again 4 days ago.  Feels similarly to the ED visit on the 12th.   States 4 days ago, felt unsteady around 4 p.m.  Has had a runny right eye without any pain or itching, mild sore throat, mild nasal congestion.  No ear pain.  No sneezing.  Did not get the Fexofenadine recommended at visit in September (different EMR system then)  Has also had some generalized weakness. Pt. Also states her neck has been bothering her as well.  States it was bothering her when in the ED on the 12th.  Points to her left trapezius.  Pain goes up to nuchal ridge, only on the left.  No history of injury.  Unable to give a good history of when it started.  Taking Tylenol or Advil PM for the pain at night so she can sleep.  Took one last night.  Otherwise taking 2 ASA daily--she does not know how many mg.   May feel a bit short of breath with her dizziness.  Some nausea as well. No chest pain.  Sometimes, feels her heart may be going a little faster than normal  Pt. Then talks about constipation and light headedness.   Pt. Reportedly discussed having difficulties with alcohol abuse in ED and was given a Librium taper with info on receiving alcohol abuse treatment.  Pt. States she has been doing really well with avoiding alcohol, but then states had at least 5 beers on Thanksgiving 6 days ago.  Prior to that she drank before the 12th.  Also,pt. Did not realize she had a year's worth of refills of her Lisinopril/HCTZ at [facility] which had been filled at her first and only visit in September.  Refilled for one month in ED.  Taking about 2 weeks daily.118    Review of Systems       Objective:   Physical Exam   NAD when patient distracted with giving  history. HEENT:  PERRL, EOMI, Discs sharp.  Visual fields full to confrontation.  No nystagmus.  Right eye with clear watering.  No erythema.  Nasal mucosa mildly swollen with clear discharge.  Throat without injection or exudate. Neck:  Tender over left trapezius from nuchal ridge to shoulder.  Tender paraspinous musculature from left cervical spine to mid scapula level on left. No cervical adenopathy.  No meningismus Chest:  CTA CV:  RRR without murmur or rub.  Radial and DP pulses normal and equal Abd:  S, +BS, NT, No HSM or masses Neuro:  Mild vertiginous symptoms with Hall Pike maneuver on sitting up bilaterally--gets mildly nauseated with this.  A & O x3, CN II-XII grossly intact, MOtor 5/5, Gait normal.  Rapid alternating motions and finger to nose to finger normal.  [place] negative.         Assessment & Plan:  1.  Neck pain:  Muscular.  May also be causing some of her vertiginous symptoms.  Discussed warm packing and stretches twice daily. Pt. Did not use the Librium 25 mg dispensed in ED for Alcohol withdrawal.  Discussed using half a cap at bedtime in apple sauce for muscle relaxant due to financial concerns.  Also to use  Ibuprofen 400-800 mg every 6 hour as needed for pain.  2.   Allergies:  Once done using Librium regularly at bedtime, to start otc Fexofenadine 180 mg daily.  3.  Hypertension:  Controlled.  Has 11 refills of Lisinopril/Hctz from September Rx.  4.  Alcohol abuse:  Unable to have warm hand off with LCSW Metta Clines as she was in with another patient.  Phone number obtained for Natosha to call and set up appt.  Suspect other issues that need addressing as well.

## 2015-06-20 ENCOUNTER — Ambulatory Visit (INDEPENDENT_AMBULATORY_CARE_PROVIDER_SITE_OTHER): Payer: Self-pay | Admitting: Internal Medicine

## 2015-06-20 ENCOUNTER — Encounter: Payer: Self-pay | Admitting: Internal Medicine

## 2015-06-20 VITALS — BP 102/80 | HR 92

## 2015-06-20 DIAGNOSIS — K921 Melena: Secondary | ICD-10-CM

## 2015-06-20 MED ORDER — OMEPRAZOLE 20 MG PO CPDR: DELAYED_RELEASE_CAPSULE | ORAL | Status: DC

## 2015-06-20 NOTE — Progress Notes (Signed)
   Subjective:    Patient ID: ELSA LAD, female    DOB: [birth year], 54 y.o.   MRN: [id number]  HPI  Patient called this morning.  Took unknown laxative last night and began having "tar like  Black "  Stools subsequently.  Were relatively formed initially, but now loose.  No real abdominal pain, just feels week.   Has been using antiinflammatories, including aspirin and took a dose of ibuprofen as instructed last night (actually took this morning) for neck pain. Specifically denies light headedness today, just feels generalized weakness. Did utilize Pepto bismal 3-4 days ago and has been constipated     Review of Systems     Objective:   Physical Exam  NAD--looks well Well dressed and made up Lungs:  CTA CV:  RRR without murmur or rub.  Radial pulses normodynamic and equal sitting, much fainter when stands, even after 1 minute.  Pt. Denies dizziness standing. Abd:  S, perhaps mild tenderness in epigastric area, No HSM or mass appreciated. +BS throughout Rectal:  Heme + scant black to dark green particulate matter with mucous on finger tip.  No mass.        Assessment & Plan:  Melena with orthostatic systolic BP:  CBC and CMP stat on order.  Pt. Actually looks well, but concerned with faint pulse and drop in systolic bp on standing.   Calling [facility] to see if can get into GI today for EGD. And to avoid ED visit for patient. Start PPI Omeprazole 40 mg daily on empty stomach

## 2015-06-20 NOTE — Patient Instructions (Signed)
No antiinflammatory medications--only Tylenol for neck pain--1000 mg twice daily No aspirin, ibuprofen, motrin, advil, aleve, naproxen Call if weakness worsens. Drink lots of water

## 2015-06-21 ENCOUNTER — Other Ambulatory Visit: Payer: Self-pay | Admitting: Gastroenterology

## 2015-06-21 ENCOUNTER — Encounter (HOSPITAL_COMMUNITY): Payer: Self-pay | Admitting: *Deleted

## 2015-06-21 DIAGNOSIS — J189 Pneumonia, unspecified organism: Secondary | ICD-10-CM | POA: Insufficient documentation

## 2015-06-21 HISTORY — DX: Pneumonia, unspecified organism: J18.9

## 2015-06-21 LAB — CBC WITH DIFFERENTIAL/PLATELET
BASOS ABS: 0 10*3/uL (ref 0.0–0.2)
Basos: 0 %
EOS (ABSOLUTE): 0 10*3/uL (ref 0.0–0.4)
Eos: 0 %
Hematocrit: 32.1 % — ABNORMAL LOW (ref 34.0–46.6)
Hemoglobin: 11.1 g/dL (ref 11.1–15.9)
IMMATURE GRANS (ABS): 0 10*3/uL (ref 0.0–0.1)
IMMATURE GRANULOCYTES: 1 %
LYMPHS: 27 %
Lymphocytes Absolute: 2.2 10*3/uL (ref 0.7–3.1)
MCH: 31.1 pg (ref 26.6–33.0)
MCHC: 34.6 g/dL (ref 31.5–35.7)
MCV: 90 fL (ref 79–97)
Monocytes Absolute: 0.7 10*3/uL (ref 0.1–0.9)
Monocytes: 9 %
NEUTROS ABS: 5.1 10*3/uL (ref 1.4–7.0)
NEUTROS PCT: 63 %
PLATELETS: 276 10*3/uL (ref 150–379)
RBC: 3.57 x10E6/uL — AB (ref 3.77–5.28)
RDW: 13.9 % (ref 12.3–15.4)
WBC: 8.1 10*3/uL (ref 3.4–10.8)

## 2015-06-21 LAB — COMPREHENSIVE METABOLIC PANEL
ALT: 19 IU/L (ref 0–32)
AST: 20 IU/L (ref 0–40)
Albumin/Globulin Ratio: 1.6 (ref 1.1–2.5)
Albumin: 4.3 g/dL (ref 3.5–5.5)
Alkaline Phosphatase: 71 IU/L (ref 39–117)
BILIRUBIN TOTAL: 0.4 mg/dL (ref 0.0–1.2)
BUN / CREAT RATIO: 38 — AB (ref 9–23)
BUN: 40 mg/dL — AB (ref 6–24)
CHLORIDE: 96 mmol/L — AB (ref 97–106)
CO2: 25 mmol/L (ref 18–29)
Calcium: 9.5 mg/dL (ref 8.7–10.2)
Creatinine, Ser: 1.06 mg/dL — ABNORMAL HIGH (ref 0.57–1.00)
GFR calc non Af Amer: 60 mL/min/{1.73_m2} (ref >59)
GFR, EST AFRICAN AMERICAN: 69 mL/min/{1.73_m2} (ref >59)
GLUCOSE: 159 mg/dL — AB (ref 65–99)
Globulin, Total: 2.7 g/dL (ref 1.5–4.5)
Potassium: 3.9 mmol/L (ref 3.5–5.2)
Sodium: 138 mmol/L (ref 136–144)
TOTAL PROTEIN: 7 g/dL (ref 6.0–8.5)

## 2015-06-21 NOTE — Progress Notes (Signed)
Pt made aware to stop  taking Aspirin, otc vitamins, fish oil and herbal medications. Do not take any NSAIDs ie: Ibuprofen, Advil, Naproxen or any medication containing Aspirin. Pt verbalized understanding of all pre-op instructions.

## 2015-06-21 NOTE — Addendum Note (Signed)
Addended by: Arta Silence on: 06/21/2015 06:10 PM   Modules accepted: Orders

## 2015-06-22 ENCOUNTER — Ambulatory Visit (HOSPITAL_COMMUNITY): Payer: PRIVATE HEALTH INSURANCE | Admitting: Certified Registered Nurse Anesthetist

## 2015-06-22 ENCOUNTER — Ambulatory Visit (HOSPITAL_COMMUNITY)
Admission: RE | Admit: 2015-06-22 | Discharge: 2015-06-22 | Disposition: A | Discharge status: Home / Self Care | Payer: PRIVATE HEALTH INSURANCE | Source: Ambulatory Visit | Attending: Gastroenterology | Admitting: Gastroenterology

## 2015-06-22 ENCOUNTER — Encounter (HOSPITAL_COMMUNITY): Payer: Self-pay | Admitting: *Deleted

## 2015-06-22 ENCOUNTER — Ambulatory Visit (HOSPITAL_COMMUNITY): Payer: Self-pay | Admitting: Certified Registered Nurse Anesthetist

## 2015-06-22 ENCOUNTER — Encounter (HOSPITAL_COMMUNITY)
Admission: RE | Disposition: A | Discharge status: Home / Self Care | Payer: Self-pay | Source: Ambulatory Visit | Attending: Gastroenterology

## 2015-06-22 DIAGNOSIS — Z800 Family history of malignant neoplasm of digestive organs: Secondary | ICD-10-CM | POA: Insufficient documentation

## 2015-06-22 DIAGNOSIS — K259 Gastric ulcer, unspecified as acute or chronic, without hemorrhage or perforation: Secondary | ICD-10-CM | POA: Insufficient documentation

## 2015-06-22 DIAGNOSIS — K2970 Gastritis, unspecified, without bleeding: Secondary | ICD-10-CM

## 2015-06-22 DIAGNOSIS — R944 Abnormal results of kidney function studies: Secondary | ICD-10-CM | POA: Insufficient documentation

## 2015-06-22 DIAGNOSIS — K449 Diaphragmatic hernia without obstruction or gangrene: Secondary | ICD-10-CM | POA: Insufficient documentation

## 2015-06-22 DIAGNOSIS — K921 Melena: Secondary | ICD-10-CM | POA: Insufficient documentation

## 2015-06-22 DIAGNOSIS — I10 Essential (primary) hypertension: Secondary | ICD-10-CM | POA: Insufficient documentation

## 2015-06-22 DIAGNOSIS — K2950 Unspecified chronic gastritis without bleeding: Secondary | ICD-10-CM | POA: Insufficient documentation

## 2015-06-22 DIAGNOSIS — D649 Anemia, unspecified: Secondary | ICD-10-CM | POA: Insufficient documentation

## 2015-06-22 DIAGNOSIS — B9681 Helicobacter pylori [H. pylori] as the cause of diseases classified elsewhere: Secondary | ICD-10-CM

## 2015-06-22 DIAGNOSIS — R7889 Finding of other specified substances, not normally found in blood: Secondary | ICD-10-CM | POA: Insufficient documentation

## 2015-06-22 DIAGNOSIS — Z79899 Other long term (current) drug therapy: Secondary | ICD-10-CM | POA: Insufficient documentation

## 2015-06-22 HISTORY — DX: Helicobacter pylori (H. pylori) as the cause of diseases classified elsewhere: B96.81

## 2015-06-22 HISTORY — DX: Reserved for inherently not codable concepts without codable children: IMO0001

## 2015-06-22 HISTORY — DX: Pneumonia, unspecified organism: J18.9

## 2015-06-22 HISTORY — DX: Melena: K92.1

## 2015-06-22 HISTORY — DX: Gastric ulcer, unspecified as acute or chronic, without hemorrhage or perforation: K25.9

## 2015-06-22 HISTORY — DX: Gastritis, unspecified, without bleeding: K29.70

## 2015-06-22 HISTORY — PX: ESOPHAGOGASTRODUODENOSCOPY (EGD) WITH PROPOFOL: SHX5813

## 2015-06-22 SURGERY — ESOPHAGOGASTRODUODENOSCOPY (EGD) WITH PROPOFOL
Anesthesia: Monitor Anesthesia Care

## 2015-06-22 MED ORDER — PROPOFOL 10 MG/ML IV BOLUS
INTRAVENOUS | Status: DC | PRN
  Administered 2015-06-22: 50 mg via INTRAVENOUS

## 2015-06-22 MED ORDER — SODIUM CHLORIDE 0.9 % IV SOLN: INTRAVENOUS | Status: DC

## 2015-06-22 MED ORDER — LACTATED RINGERS IV SOLN
INTRAVENOUS | Status: DC
  Administered 2015-06-22: 07:00:00 via INTRAVENOUS

## 2015-06-22 MED ORDER — PROPOFOL 500 MG/50ML IV EMUL
INTRAVENOUS | Status: DC | PRN
  Administered 2015-06-22: 120 ug/kg/min via INTRAVENOUS

## 2015-06-22 MED ORDER — BUTAMBEN-TETRACAINE-BENZOCAINE 2-2-14 % EX AERO
INHALATION_SPRAY | CUTANEOUS | Status: DC | PRN
  Administered 2015-06-22: 2 via TOPICAL

## 2015-06-22 NOTE — Anesthesia Preprocedure Evaluation (Signed)
Anesthesia Evaluation  Patient identified by MRN, date of birth, ID band Patient awake    Reviewed: Allergy & Precautions, NPO status , Patient's Chart, lab work & pertinent test results  Airway Mallampati: II   Neck ROM: full    Dental   Pulmonary shortness of breath,    breath sounds clear to auscultation       Cardiovascular hypertension,  Rhythm:regular Rate:Normal     Neuro/Psych    GI/Hepatic   Endo/Other  obese  Renal/GU      Musculoskeletal   Abdominal   Peds  Hematology   Anesthesia Other Findings   Reproductive/Obstetrics                             Anesthesia Physical Anesthesia Plan  ASA: II  Anesthesia Plan: MAC   Post-op Pain Management:    Induction: Intravenous  Airway Management Planned: Nasal Cannula  Additional Equipment:   Intra-op Plan:   Post-operative Plan:   Informed Consent: I have reviewed the patients History and Physical, chart, labs and discussed the procedure including the risks, benefits and alternatives for the proposed anesthesia with the patient or authorized representative who has indicated his/her understanding and acceptance.     Plan Discussed with: CRNA, Anesthesiologist and Surgeon  Anesthesia Plan Comments:         Anesthesia Quick Evaluation

## 2015-06-22 NOTE — Transfer of Care (Signed)
Immediate Anesthesia Transfer of Care Note  Patient: [name] [name]  Procedure(s) Performed: Procedure(s): ESOPHAGOGASTRODUODENOSCOPY (EGD) WITH PROPOFOL (N/A)  Patient Location: Endoscopy Unit  Anesthesia Type:MAC  Level of Consciousness: awake, alert , oriented and patient cooperative  Airway & Oxygen Therapy: Patient Spontanous Breathing and Patient connected to face mask oxygen  Post-op Assessment: Report given to RN, Post -op Vital signs reviewed and stable and Patient moving all extremities X 4  Post vital signs: Reviewed and stable  Last Vitals:  Filed Vitals:   06/22/15 0639  BP: 162/105  Pulse: 92  Temp: 36.9 C  Resp: 18    Complications: No apparent anesthesia complications

## 2015-06-22 NOTE — Op Note (Signed)
[facility] [place], [address]   ENDOSCOPY PROCEDURE REPORT  PATIENT: [name] [name], [name] [name]  MR#: [id number] BIRTHDATE: Dec 19, 1960 , 54  yrs. old GENDER: female ENDOSCOPIST: Arta Silence, MD REFERRED BY:  Mack Hook, M.D. PROCEDURE DATE:  07-18-15 PROCEDURE:  EGD w/ biopsy ASA CLASS:     Class II INDICATIONS:  melena, anemia, elevated BUN. MEDICATIONS: Monitored anesthesia care TOPICAL ANESTHETIC: Cetacaine Spray DESCRIPTION OF PROCEDURE: After the risks benefits and alternatives of the procedure were thoroughly explained, informed consent was obtained.  The Pentax Gastroscope [id number] endoscope was introduced through the mouth and advanced to the second portion of the duodenum. The instrument was slowly withdrawn as the mucosa was fully examined. Estimated blood loss is zero unless otherwise noted in this procedure report.    Findings:  Small hiatal hernia, otherwise normal esophagus.  Mild diffuse gastritis, biopsied with cold forceps.  Small 3mm punctate ulcer, clean-based without adherent clot, visible vessel or active bleeding, likely in midst of healing, in pre-pyloric antrum.   Otherwise normal stomach and pylorus.  Retroflexion into cardia showed small hiatal hernia, otherwise normal.  Normal duodenum to the second portion.  There was no old or fresh blood seen to the extent of our examination.           The scope was then withdrawn from the patient and the procedure completed.  COMPLICATIONS: There were no immediate complications.  ENDOSCOPIC IMPRESSION:     Gastric ulcer, endoscopically appears LOW risk for rebleeding, but likely cause of patient's recent melena, elevated BUN and anemia.  Suspect NSAID etiology, but biopsies to exclude H. pylori were obtained.  RECOMMENDATIONS:     1.  Watch for potential complications of procedure. 2.  PPI (e.g., Prilosec 20 mg po qd) x 8 weeks. 3.  Await gastric biopsies. 4.  No  NSAIDs. 5.  Advance diet as tolerated. 6.  Will need elective colonoscopy, given patient's personal history of polyps and family history of colon cancer (mother). 7.  OK for patient to be discharged home with close outpatient follow-up. 8.  Follow-up in [place] clinic in 4-6 weeks, unless signs of recurrent bleeding ensue.  eSigned:  Arta Silence, MD 07-18-2015 8:02 AM   CC:  CPT CODES: ICD CODES:

## 2015-06-22 NOTE — Discharge Instructions (Signed)

## 2015-06-22 NOTE — Anesthesia Postprocedure Evaluation (Signed)
Anesthesia Post Note  Patient: [name] [name]  Procedure(s) Performed: Procedure(s) (LRB): ESOPHAGOGASTRODUODENOSCOPY (EGD) WITH PROPOFOL (N/A)  Patient location during evaluation: PACU Anesthesia Type: MAC Level of consciousness: awake and alert Pain management: pain level controlled Vital Signs Assessment: post-procedure vital signs reviewed and stable Respiratory status: spontaneous breathing, nonlabored ventilation, respiratory function stable and patient connected to nasal cannula oxygen Cardiovascular status: stable and blood pressure returned to baseline Anesthetic complications: no    Last Vitals:  Filed Vitals:   06/22/15 0820 06/22/15 0830  BP: 148/93 186/110  Pulse: 83 78  Temp:    Resp: 17 15    Last Pain: There were no vitals filed for this visit.               [place]

## 2015-06-22 NOTE — H&P (Signed)
Patient interval history reviewed.  Patient examined again.  There has been no change from documented H/P dated 06/21/15 (scanned into chart from our office) except as documented above.  Assessment:  1.  Melena. 2.  Anemia.  Plan:  1.  Endoscopy for further evaluation. 2.  Risks (bleeding, infection, bowel perforation that could require surgery, sedation-related changes in cardiopulmonary systems), benefits (identification and possible treatment of source of symptoms, exclusion of certain causes of symptoms), and alternatives (watchful waiting, radiographic imaging studies, empiric medical treatment) of upper endoscopy (EGD) were explained to patient/family in detail and patient wishes to proceed.

## 2015-06-25 ENCOUNTER — Encounter (HOSPITAL_COMMUNITY): Payer: Self-pay | Admitting: Gastroenterology

## 2015-06-28 ENCOUNTER — Ambulatory Visit (INDEPENDENT_AMBULATORY_CARE_PROVIDER_SITE_OTHER): Payer: Self-pay | Admitting: Licensed Clinical Social Worker

## 2015-06-28 DIAGNOSIS — Z1342 Encounter for screening for global developmental delays (milestones): Principal | ICD-10-CM

## 2015-06-28 DIAGNOSIS — Z1330 Encounter for screening examination for mental health and behavioral disorders, unspecified: Secondary | ICD-10-CM
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
## 2015-06-29 ENCOUNTER — Ambulatory Visit (INDEPENDENT_AMBULATORY_CARE_PROVIDER_SITE_OTHER): Payer: Self-pay | Admitting: Internal Medicine

## 2015-06-29 ENCOUNTER — Encounter: Payer: Self-pay | Admitting: Internal Medicine

## 2015-06-29 VITALS — BP 146/90 | HR 82 | Ht 64.0 in | Wt 195.0 lb

## 2015-06-29 DIAGNOSIS — K2970 Gastritis, unspecified, without bleeding: Principal | ICD-10-CM

## 2015-06-29 DIAGNOSIS — B9681 Helicobacter pylori [H. pylori] as the cause of diseases classified elsewhere: Secondary | ICD-10-CM

## 2015-06-29 DIAGNOSIS — K253 Acute gastric ulcer without hemorrhage or perforation: Secondary | ICD-10-CM

## 2015-06-29 MED ORDER — CLARITHROMYCIN 500 MG PO TABS: 500.0000 mg | ORAL_TABLET | Freq: Two times a day (BID) | ORAL | Status: DC

## 2015-06-29 MED ORDER — METRONIDAZOLE 500 MG PO TABS: ORAL_TABLET | ORAL | Status: DC

## 2015-06-29 MED ORDER — FERROUS SULFATE 325 (65 FE) MG PO TABS: ORAL_TABLET | ORAL | Status: DC

## 2015-06-29 MED ORDER — AMOXICILLIN 500 MG PO TABS
ORAL_TABLET | ORAL | Status: DC
Start: 2015-06-29 — End: 2017-10-06

## 2015-06-29 NOTE — Progress Notes (Signed)
   Subjective:    Patient ID: [name] [name], female    DOB: Mar 10, 1961, 54 y.o.   MRN: [id number]  HPI  Pt. Here for follow up of upper GI bleed.  Was seen by Dr. Paulita Fujita and underwent EGD 12.2.2016 with findings of healing 5 mm prepyloric ulcer and gastritis.  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  Has not yet been started on treatment for that.  Taking 40 mg of Omeprazole daily on an empty stomach.  Feels much better.  No longer with black stool. Still with black stool.   Still constipated, however.    Review of Systems     Objective:   Physical Exam  NAD Lungs:  CTA CV:  RRR with grade I/VI SEM, LU sternal border.  Radial pulses normal and equal. Abd:  S, NT, No HSM or masses appreciated, +BS throughout        Assessment & Plan:  1.  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  Patient is allergic to Tetracyclines.  [facility] closed currently, but will send in 14 day treatment of Amoxicillin/Metronidazole/Clarithromycin and PPI.   Called main pharmacy at [facility], but pharmacist not aware of resistance rates to Clarithromycin/Metronidazole Will run by Dr. Paulita Fujita beginning of next week.  2.  MIld anemia:  Take ferrous sulfate otc with orange juice daily for 1 month  3.  Constipation:  TAke fiber laxative like Metamucil daily with large glass of water.  6-8 glasses of water daily

## 2015-06-29 NOTE — Patient Instructions (Addendum)
Call if you develop vaginal discharge and or itching. Continue the Omeprazole for a total of 8 weeks.  Take Ferrous Sulfate 325 mg with orange juice once daily for 1 month

## 2015-07-02 ENCOUNTER — Telehealth: Payer: Self-pay | Admitting: Internal Medicine

## 2015-07-02 NOTE — Telephone Encounter (Signed)
Pt. Called to find out whether Dr.:  Paulita Fujita was good with the antibiotic regimen for H. Pylori that was chosen.  Pt. Is tetracycline allergic and so Clarithromycin, Amoxicillin,  Metronidazole prescribed.  Also calling to see how resistant H. Pylori is to Clarithromycin in this area.  Could not get info from [place]. Await a call back from Dr. Paulita Fujita. Pt. Unable to pick up Rx until Wednesday regardless. Dr. Paulita Fujita called back as finishing note--feels her ulcer more likely caused by NSAID use, but would treat H.Pylori as well.  He if fine with regimen. Pt. Notified.

## 2015-07-05 ENCOUNTER — Other Ambulatory Visit (INDEPENDENT_AMBULATORY_CARE_PROVIDER_SITE_OTHER): Payer: Self-pay | Admitting: Licensed Clinical Social Worker

## 2015-07-05 DIAGNOSIS — F1010 Alcohol abuse, uncomplicated: Secondary | ICD-10-CM
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
## 2015-07-06 ENCOUNTER — Encounter: Payer: Self-pay | Admitting: Licensed Clinical Social Worker

## 2015-07-06 DIAGNOSIS — F1010 Alcohol abuse, uncomplicated: Secondary | ICD-10-CM | POA: Insufficient documentation

## 2015-07-12 ENCOUNTER — Other Ambulatory Visit (INDEPENDENT_AMBULATORY_CARE_PROVIDER_SITE_OTHER): Payer: Self-pay | Admitting: Licensed Clinical Social Worker

## 2015-07-12 DIAGNOSIS — F1010 Alcohol abuse, uncomplicated: Secondary | ICD-10-CM

## 2015-07-12 NOTE — Progress Notes (Signed)
   THERAPY PROGRESS NOTE  Session Time:  60 minutes Participation Level: Active  Behavioral Response: NeatAlertDepressed and Lonely  Type of Therapy: Individual Therapy  Treatment Goals addressed: Coping  Interventions: Supportive  Summary: SONIQUE HARLAN is a 54 y.o. female who presents with depressed moood and appropriate affect. Denis reported that she had been feeling lonely since Sunday, and did not know why. After talking about it Eron reported that she felt lonely because she wanted a female companion. Jasly reported that she felt "ugly" because she was not able to get a female companion. [place] got tearful when talking about that she was lonely. Kaydan reported that she was drinking more because of those feelings, but drinking did not change the feelings. Senika reported that she wanted to learn other skills instead of drinking to cope with feelings. Emmarae suggested as a treatment goal that she needs better coping skills to deal when she feels this way. Ylonda reported that she wanted to volunteer and have more things to do with her time. Araminta reported that her sister came to see her earlier this week and they bonded more than ever. She reported that she was able to connect with her sister because they had been through the same experiences. Rhiannah became tearful when she reported that her son was homeless. She reported feeling like there was nothing that she could do, and she wanted to be there for him. Samaire reported that she might go to [employer], just to try it out again. She reported that she did not feel like her story was as bad as other stories she hears in the meetings. Quiera reported that she could complete the homework that [place] set for the next session.   Suicidal/Homicidal: Nowithout intent/plan  Therapist Response: Social work Theatre manager ([place]) asked Milinda how she had been doing since last time they spoke. SWI used active listening when Emilya was telling her how she had been feeling and  what recently had happened with her. SWI used reflections when Noreli reported that she had been feeling lonely. SWI used empathy when Rocquel reported her son is homeless. SWI used active listening when Leshon reported that her sister came by to see her. SWI was supportive when Dennisha spoke about past experiences in her life and how they affect her now. SWI assisted Loralye with a treatment plan and formulating goals in therapy. SWI gave [place] meeting resources per [facility] request. SWI used reflections when Emeri was talking about her experiences with AA meetings. SWI gave Abbigayle homework to think about what steps she can take when she is feeling lonely, or something new she can try.  Plan: Return again in 2 weeks.  Diagnosis: Axis I: Alcohol Abuse    Axis II: No diagnosis    [name], Student-SW 07/12/2015

## 2015-07-17 ENCOUNTER — Ambulatory Visit: Payer: Self-pay | Admitting: Internal Medicine

## 2015-07-26 ENCOUNTER — Other Ambulatory Visit (INDEPENDENT_AMBULATORY_CARE_PROVIDER_SITE_OTHER): Payer: Self-pay | Admitting: Licensed Clinical Social Worker

## 2015-07-26 DIAGNOSIS — F1010 Alcohol abuse, uncomplicated: Secondary | ICD-10-CM
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
## 2015-08-02 ENCOUNTER — Other Ambulatory Visit (INDEPENDENT_AMBULATORY_CARE_PROVIDER_SITE_OTHER): Payer: Self-pay | Admitting: Licensed Clinical Social Worker

## 2015-08-02 DIAGNOSIS — F1010 Alcohol abuse, uncomplicated: Secondary | ICD-10-CM

## 2015-08-02 NOTE — Progress Notes (Signed)
   THERAPY PROGRESS NOTE  Session Time: 45 minutes  Participation Level: Active  Behavioral Response: Neat and Well GroomedAlertEuthymic  Type of Therapy: Individual Therapy  Treatment Goals addressed: Coping  Interventions: Supportive  Summary: [name] [name] is a 55 y.o. female who presents with euthymic mood and appropriate affect. [name] reported that she had been doing well lately and that the deep breathing technique we reviewed last session had worked for her. [name] reported that she is worried about her finances because she will soon not receive the financial help that she had been receiving. [name] reported that she is stressed because of that but has a plan that she is trying to reduced the stress. [name] reported that she had been sleeping better and has been getting back to her morning routine. [name] reported that she is going to take another look at the [place] list SWI provided in previous session and try to work in a meeting during the week. [name] reported that she had a few drinks the week previous, but it did not do anything for her. [name] reported that she has noticed that she over reacts to situations and that can cause her to drink. [name] reported that she wants to work on learning how to react without drinking.   Suicidal/Homicidal: Nowithout intent/plan  Therapist Response: Social Work Intern ([place]) asked how [name] has been doing the past week. SWI used active listening skills when [name] spoke about a situation she had where she used deep breathing. SWI used empathy when [name] was explaining her concern about finances. SWI used active listening when [name] was explaining her housing situation. SWI used active listening when [name] was talking about her drinking.   Plan: Return again in 1 week.  Diagnosis: Axis I: Alcohol Abuse    Axis II: No diagnosis    [name], Student-SW 08/02/2015

## 2015-08-09 ENCOUNTER — Other Ambulatory Visit (INDEPENDENT_AMBULATORY_CARE_PROVIDER_SITE_OTHER): Payer: Self-pay | Admitting: Licensed Clinical Social Worker

## 2015-08-09 DIAGNOSIS — F1010 Alcohol abuse, uncomplicated: Secondary | ICD-10-CM

## 2015-08-09 NOTE — Progress Notes (Signed)
   THERAPY PROGRESS NOTE  Session Time: 1 Hour  Participation Level: Active  Behavioral Response: Fairly GroomedDrowsyHopeless  Type of Therapy: Individual Therapy  Treatment Goals addressed: Coping  Interventions: Supportive  Summary: [name] [name] is a 55 y.o. female who presents with hopeless mood and appropriate affect. [name] reported that her ulcers were acting up again and she had missed 3 days of work. [name] reported that she told her job that she had a doctors note, when in fact she did not. [name] reported feeling like she lied to her job and that she is "just digging myself in a hole". [name] reported that she needed to talk to [place] about what has been going on and feel like she is not in control. [name] reported that she was scared because of her getting sick that it would lead her to become homeless again. She reported that if she went to work without an excused absence then she would be fired. [name] reported she did not understand why she kept feeling like she is going back to the "same place". [name] was grateful what SWI helped find a couple of houses online for her to look at to get a start. [name] reported that she would want to stay in the apartment she is at but doesn't know if she could afford it. [name] opened up about a situation that happened last session to [name]. [name] reported that she has a female friend that comes over, but they are not in a relationship. [name] reported that when he comes over it makes her feel like she is actually wanted, but when he leaves her self worth goes back down to where it was before. [name] reported that when he leaves it makes her feel the need to drink, and she usually does. [name] reported that sometimes she does not answer this guy friend but most of the time she ends up answering him. [name] reported that at that moment she felt like she was not that different from one of the people she had met in the [place] meetings. Previously [name] reported that she did  not feel like she could relate to people in [place]. [name] reported that she tried the deep breathing but did not see the need because she did not feel like she was panicking. [name] reported that she has been praying and asking for guidance.   Suicidal/Homicidal: Nowithout intent/plan  Therapist Response: Social Work Intern (SWI) used active listening when [name] was talking about her health problems. SWI used empathy when [name] was explaining about how she was worried about her job. SWI used open ended questions when asking [name] about her relationship with a female friend. SWI used empathy when [name] was opening up about her female friend. SWI used active listening when [name] spoke about her future and why she wants to go to counseling. SWI explained more about when to use the deep breathing exercises. SWI shared the case management information with [name]. SWI used active listening when [name] reported about AA and how she felt.   Plan: Return again in 1 week.  Diagnosis: Axis I: Alcohol Abuse    Axis II: No diagnosis    [name] [name], Student-SW 08/09/2015

## 2015-08-10 ENCOUNTER — Encounter: Payer: Self-pay | Admitting: Internal Medicine

## 2015-08-10 NOTE — Progress Notes (Unsigned)
Patient ID: [name] [name], female   DOB: 1960/10/09, 55 y.o.   MRN: [id number] Pt. Having nausea, vomiting. Stool is not a dark color.  Is taking PPI.  Started 4 days ago.  Is keeping fluids down.  Has had some indigestion in chest.  Has had alcohol intake as well.  Discussed she cannot do this as will tear up stomach.  More than anything she needs a doctor's note to get back to work as has been off while she is not feeling well.  Has money to get medication to treat H. Pylori now--will have her brother take her to pick up today. I will have [place] call her back to see if we have opening today or Monday.

## 2015-08-13 ENCOUNTER — Ambulatory Visit: Payer: Self-pay | Admitting: Internal Medicine

## 2015-08-14 ENCOUNTER — Ambulatory Visit (INDEPENDENT_AMBULATORY_CARE_PROVIDER_SITE_OTHER): Payer: Self-pay | Admitting: Internal Medicine

## 2015-08-14 VITALS — BP 140/90 | HR 88 | Resp 18 | Ht 64.0 in | Wt 196.0 lb

## 2015-08-14 DIAGNOSIS — F1010 Alcohol abuse, uncomplicated: Secondary | ICD-10-CM

## 2015-08-14 DIAGNOSIS — K253 Acute gastric ulcer without hemorrhage or perforation: Secondary | ICD-10-CM

## 2015-08-14 NOTE — Progress Notes (Signed)
   Subjective:    Patient ID: [name] [name], female    DOB: [birth year], 55 y.o.   MRN: [id number]  HPI  Pt. Called in end of last week with nausea, vomiting and wanted to be seen, but was unable to get in for various reasons.  She had not filled her treatment for H. Pylori as she states she could not afford, though we thought we had confirmed she could afford when prescribed.   Is now on previously ordered regimen and is now feeling much better.  States she was continuing on the Omeprazole even when she was not feeling well. Not clear the treatment for H. Pylori is what has made her feel better or if she just had a viral gastritis.    Pt. Was still drinking alcohol when I spoke with her Saturday.  Was drinking 40 oz of malt liquor and a 24 oz can of beer as well.   Does have some Librium (by her description) does not know milligrams--from ED.  Is not needing to use daily, so not clear this is alcohol withdrawal.  Is now 4 days out from last drink.  Has been given AA groups by Jody, our SW student, but has not made an effort to get to one.    Is not taking any antiinflammatories.      Review of Systems     Objective:   Physical Exam NAD LUngs:  CTA CV:  RRR without murmur or rub, radial pulses normal and equal Abd:  S, NT, No HSM or masses, + BS       Assessment & Plan:  1.  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 Reportedly finally started treatment.  Very concerned with her difficulty with compliance.  Does not follow treatment recommendations, then calls in in duress.  Strongly urged her to get started with treatment for alcoholism.  She is being followed by social work regarding this.  Appears to be doing better now she is completing treatment.  Not clear if just had an intervening viral gastritis or recurrence of symptoms  Of ulcer/gastritis diagnosed with EGD  2.  Alcoholism:  As above.

## 2015-08-14 NOTE — Patient Instructions (Addendum)
Keep appt. With Jody Thursday Get started with an AA group ASAP

## 2015-08-16 ENCOUNTER — Other Ambulatory Visit (INDEPENDENT_AMBULATORY_CARE_PROVIDER_SITE_OTHER): Payer: Self-pay | Admitting: Licensed Clinical Social Worker

## 2015-08-16 DIAGNOSIS — F1010 Alcohol abuse, uncomplicated: Secondary | ICD-10-CM

## 2015-08-16 NOTE — Progress Notes (Signed)
   THERAPY PROGRESS NOTE  Session Time: 60 minutes  Participation Level: Active  Behavioral Response: Well GroomedAlertEuthymic  Type of Therapy: Individual Therapy  Treatment Goals addressed: Coping  Interventions: Supportive  Summary: [name] [name] is a 55 y.o. female who presents with euthymic mood and appropriate affect. [name] [name] reported that she felt a lot better this week because she has been taking medication and reported not drinking as much. [name] [name] reported that she had went to the doctor and talked with her about her drinking, but said that was not the reason why she was sick that last week. [name] [name] reported that she felt like she has gotten away from her worship and that was why she felt so bad last week. [name] [name] reported that she needs to get back into church and is interested in visiting [facility], after receiving an invite. [name] [name] reported that she lost her list of [place] meetings, and that if SWI could supply her with another that she would see about going to meetings. She reported that if she could get a sponsor that might help her with the cravings of alcohol. [name] [name] reported that in the past when she was craving alcohol she would just go get some, but she does not know how to stop the cravings. [name] [name] reported that her self esteem was low, and that she did not feel beautiful. She reported that she had not felt beautiful at all in her adult life. [name] [name] reported that she tries to fix her hair and makeup to make herself look beautiful, but that only helps some. [name] [name] did reported that her soul is beautiful. [name] [name] reported that she did not know what loving yourself meant, but she felt like she did love herself. [name] [name] reported that she is a strong person and that she is compassionate. [name] [name] said at previous times in her life she had to get validation from other people. [name] [name] reported that she did drink the previous day because she was frustrated, but did not want to go into details because it  was her daughter, which lives in the house. [name] [name] reported that she is still nervous about being let off of [employer].   Suicidal/Homicidal: NAwithout intent/plan  Therapist Response: Social work Theatre manager ([place]) asked [name] [name] how she had been. SWI used open ended questions when asking [name] [name] about her experience last week, and how it has changed this week. SWI used active listening when [name] [name] talked about how she was feeling this week. SWI used empathy when [name] [name] disclosed that she does not feel beautiful. SWI used open ended questions when asking [name] [name] about her religion and how that has been helping her. SWI asked [name] [name] about coping skills that she could do when craving alcohol. SWI explored with [name] [name] what loving yourself looks like.  Plan: Return again in one week.  Diagnosis: Axis I: Alcohol Abuse    Axis II: No diagnosis    [name], Student-SW 08/16/2015

## 2015-08-23 ENCOUNTER — Other Ambulatory Visit (INDEPENDENT_AMBULATORY_CARE_PROVIDER_SITE_OTHER): Payer: Self-pay | Admitting: Licensed Clinical Social Worker

## 2015-08-23 DIAGNOSIS — F1010 Alcohol abuse, uncomplicated: Secondary | ICD-10-CM
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
## 2015-08-24 NOTE — Progress Notes (Signed)
Patient was seen at the office on 08/14/15 @ 12:30 pm

## 2015-08-28 ENCOUNTER — Ambulatory Visit: Payer: Self-pay | Admitting: Internal Medicine

## 2015-08-30 ENCOUNTER — Ambulatory Visit (INDEPENDENT_AMBULATORY_CARE_PROVIDER_SITE_OTHER): Payer: Self-pay | Admitting: Licensed Clinical Social Worker

## 2015-08-30 DIAGNOSIS — F1010 Alcohol abuse, uncomplicated: Secondary | ICD-10-CM

## 2015-08-30 NOTE — Progress Notes (Signed)
   THERAPY PROGRESS NOTE  Session Time: 45 minutes   Participation Level: Active  Behavioral Response: Well GroomedAlertEuthymic  Type of Therapy: Individual Therapy  Treatment Goals addressed: Coping  Interventions: CBT  Summary: [name] [name] is a 55 y.o. female who presents with euthymic mood and appropriate affect. [name] [name] reported that everything had been going well and she was feeling great. [name] [name] was very excited about receiving her copy of the eco map. [name] [name] reported that she was feeling better but was only taking her medicaton once a day because of the side effects, but should be done with it this week. [name] [name] reported that her best friend and her are not talking much and she is not happy about it. [name] [name] reported "life will go on". [name] [name] reported that she has now realized her limitations with alcohol. She reported that she now can start realizing when she needs to get her alcohol back under control, and hopes to stay this way. [name] [name] reported that she is trying to find a second job but did not receive a call back. She reported she was feeling worried about paying her rent and is looking to move soon. [name] [name] reported that she is interested in cognitive behavorial therapy after SWI introduced it. [name] [name] reported that at work she had a situation where someone ignored her and it upset her. Later that day she realized maybe that person was having a bad day. [name] [name] reported she would start thinking about her first thought when a situation occurs.   Suicidal/Homicidal: Nowithout intent/plan  Therapist Response: Social Work Theatre manager ([place]) greeted Environmental education officer and asked how she was doing. SWI used active listening when [name] [name] spoke about not getting a call back from a job. SWI used empathy when [name] [name] reported that her best friend and her were still not talking regularly. SWI used affirmations when [name] [name] reported about her drinking. SWI introduced Cognitive Behavioral Therapy to [name] [name] and assisted [name] [name] with  analyzing a recent situation at work. SWI assigned [name] [name] homework of thinking about her first thought in any situation and to bring that situation to the next session.   Plan: Return again in one week.  Diagnosis: Axis I: Alcohol Abuse    Axis II: No diagnosis    [name], Student-SW 08/30/2015

## 2015-10-18 ENCOUNTER — Telehealth: Payer: Self-pay | Admitting: Licensed Clinical Social Worker

## 2015-10-28 ENCOUNTER — Encounter: Payer: Self-pay | Admitting: Internal Medicine

## 2016-09-16 NOTE — Addendum Note (Signed)
Addended by: Metta Clines on: 09/16/2016 03:51 PM   Modules accepted: Level of Service

## 2017-10-06 ENCOUNTER — Encounter (INDEPENDENT_AMBULATORY_CARE_PROVIDER_SITE_OTHER): Payer: Self-pay

## 2017-10-06 ENCOUNTER — Other Ambulatory Visit: Payer: Self-pay

## 2017-10-06 ENCOUNTER — Ambulatory Visit: Payer: Self-pay | Admitting: Internal Medicine

## 2017-10-06 ENCOUNTER — Encounter: Payer: Self-pay | Admitting: Internal Medicine

## 2017-10-06 VITALS — BP 174/93 | HR 99 | Temp 97.7°F | Ht 63.0 in | Wt 214.1 lb

## 2017-10-06 DIAGNOSIS — I10 Essential (primary) hypertension: Secondary | ICD-10-CM

## 2017-10-06 DIAGNOSIS — F419 Anxiety disorder, unspecified: Secondary | ICD-10-CM

## 2017-10-06 DIAGNOSIS — M1712 Unilateral primary osteoarthritis, left knee: Secondary | ICD-10-CM

## 2017-10-06 DIAGNOSIS — F331 Major depressive disorder, recurrent, moderate: Secondary | ICD-10-CM

## 2017-10-06 DIAGNOSIS — F418 Other specified anxiety disorders: Secondary | ICD-10-CM | POA: Insufficient documentation

## 2017-10-06 DIAGNOSIS — Z0000 Encounter for general adult medical examination without abnormal findings: Secondary | ICD-10-CM | POA: Insufficient documentation

## 2017-10-06 DIAGNOSIS — R011 Cardiac murmur, unspecified: Secondary | ICD-10-CM

## 2017-10-06 DIAGNOSIS — F1099 Alcohol use, unspecified with unspecified alcohol-induced disorder: Secondary | ICD-10-CM

## 2017-10-06 DIAGNOSIS — Z8719 Personal history of other diseases of the digestive system: Secondary | ICD-10-CM

## 2017-10-06 DIAGNOSIS — Z79899 Other long term (current) drug therapy: Secondary | ICD-10-CM

## 2017-10-06 DIAGNOSIS — F1010 Alcohol abuse, uncomplicated: Secondary | ICD-10-CM

## 2017-10-06 DIAGNOSIS — M159 Polyosteoarthritis, unspecified: Secondary | ICD-10-CM | POA: Insufficient documentation

## 2017-10-06 MED ORDER — FLUOXETINE HCL 20 MG PO CAPS: 20.0000 mg | ORAL_CAPSULE | Freq: Every day | ORAL | 2 refills | Status: DC

## 2017-10-06 MED ORDER — LISINOPRIL-HYDROCHLOROTHIAZIDE 20-25 MG PO TABS: 1.0000 | ORAL_TABLET | Freq: Every day | ORAL | 2 refills | Status: DC

## 2017-10-06 NOTE — Assessment & Plan Note (Signed)
She currently drinks about 72 oz of beer during the week and occasionally more on the weekends which she reports as an improvement from what she used to drink.  She is currently using beer to help calm down her anxiety.  She has previously received treatment for alcohol use disorder in 2017.  Plan: - we are beginning treatment with fluoxetine today to better control her anxiety and depression.  As this becomes more well-controlled, hopefully her alcohol intake will decrease and we can revisit the idea of treating her alcohol use with something such as Naltrexone.

## 2017-10-06 NOTE — Assessment & Plan Note (Addendum)
Blood pressure today is 174/93.  She previously was on lisinopril-HCTZ combination but last took this medication in about 2016.  She says she does not want to be on medication for life and would rather do something organic if possible.  She was agreeable to resuming BP medication today.  Plan: - Restart lisinopril-HCTZ 20-25mg  daily.  Medication on $4 list at [facility] which patient states is affordable.  - BMET today - RTC 2 weeks for recheck and labs

## 2017-10-06 NOTE — Assessment & Plan Note (Signed)
She is due for HCV testing, colon cancer screening, breast cancer screening, Pap smear.  Her last colonoscopy was in 2002 (early due to family hx of colon cancer in mother).  She currently does not have any alarming symptoms to warrant urgent screening.  She will be working towards obtaining the [employer] to further utilize services in the community.  Plan: - [facility] information given in order to obtain breast cancer screen and Pap smear - Plan to complete HCV screen and colon cancer screening once [facility] Card obtained.

## 2017-10-06 NOTE — Assessment & Plan Note (Signed)
She has history of H Pylori gastritis via EGD in 06/2015 and has completed treatment.  She occasionally has GERD-like symptoms but none that are chronic.   Plan: - will remove old medications from her medication list

## 2017-10-06 NOTE — Patient Instructions (Signed)
FOLLOW-UP INSTRUCTIONS When: 2 weeks For: Blood pressure What to bring: current medications  I have started 2 medications  1. Lisinopril - HCTZ for blood pressure 2. Fluoxetine for anxiety and depression symptoms  Please contact [facility] for counseling  Please contact [facility] for breast and cervical cancer screening

## 2017-10-06 NOTE — Progress Notes (Signed)
CC: Establish care with [facility], f/u HTN  HPI:  [name] is a 57 y.o. woman with a past medical history listed below here today for follow up of her HTN.  For details of today's visit and the status of her chronic medical issues please refer to the assessment and plan.   Past Medical History:  Diagnosis Date  . Bloody stool   . Dental decay   . Gastritis, Helicobacter pylori 42/01/622  . Hypertension   . Pneumonia 06/2015  . Prepyloric ulcer 06/22/2015  . Seasonal allergies   . Shortness of breath dyspnea    with exertion   Review of Systems:  Please see pertinent ROS reviewed in HPI and problem based charting.   Physical Exam:  Vitals:   10/06/17 0952  BP: (!) 174/93  Pulse: 99  Temp: 97.7 F (36.5 C)  TempSrc: Oral  SpO2: 98%  Weight: 214 lb 1.6 oz (97.1 kg)   Physical Exam  Constitutional: She is well-developed, well-nourished, and in no distress.  HENT:  Head: Normocephalic and atraumatic.  Eyes: Conjunctivae and EOM are normal.  Cardiovascular: Normal rate and regular rhythm.  Murmur heard. Mild soft systolic murmur.   Pulmonary/Chest: Effort normal and breath sounds normal. She has no wheezes. She has no rales.  Musculoskeletal: Normal range of motion. She exhibits no edema or tenderness.  Left knee with surgical scar and crepitus with ROM  Psychiatric: Mood and affect normal.     Assessment & Plan:   See Encounters Tab for problem based charting.  Patient discussed with Dr. Lynnae January.  Essential hypertension Blood pressure today is 174/93.  She previously was on lisinopril-HCTZ combination but last took this medication in about 2016.  She says she does not want to be on medication for life and would rather do something organic if possible.  She was agreeable to resuming BP medication today.  Plan: - Restart lisinopril-HCTZ 20-25mg  daily.  Medication on $4 list at [facility] which patient states is affordable.  - BMET today - RTC 2 weeks for  recheck and labs  Prepyloric ulcer She has history of H Pylori gastritis via EGD in 06/2015 and has completed treatment.  She occasionally has GERD-like symptoms but none that are chronic.   Plan: - will remove old medications from her medication list  Alcohol use disorder She currently drinks about 72 oz of beer during the week and occasionally more on the weekends which she reports as an improvement from what she used to drink.  She is currently using beer to help calm down her anxiety.  She has previously received treatment for alcohol use disorder in 2017.  Plan: - we are beginning treatment with fluoxetine today to better control her anxiety and depression.  As this becomes more well-controlled, hopefully her alcohol intake will decrease and we can revisit the idea of treating her alcohol use with something such as Naltrexone.   Anxiety with depression Her GAD-7 today is 15 and her PHQ-9 is 11 indicating moderate depression.  She has no HI/SI.  She has been using beer to calm down her anxiety.  She gets more anxious lately with things like going to the store or being on the interstate as a car passenger.  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  She is interested in treatment for her anxiety as well as counseling.  Plan: - Start fluoxetine 20mg  daily.  Can titrate up as needed - Patient has information for [facility] and will contact them for services.   Healthcare maintenance She is due for HCV testing, colon cancer screening, breast cancer screening, Pap smear.  Her last colonoscopy was in 2002 (early due to family hx of colon cancer in mother).  She currently does not have any alarming symptoms to warrant urgent screening.  She will be working towards obtaining the [employer] to further utilize services in the community.  Plan: - [facility] information given in order to obtain breast cancer screen and Pap  smear - Plan to complete HCV screen and colon cancer screening once [facility] Card obtained.

## 2017-10-06 NOTE — Assessment & Plan Note (Signed)
Her GAD-7 today is 15 and her PHQ-9 is 11 indicating moderate depression.  She has no HI/SI.  She has been using beer to calm down her anxiety.  She gets more anxious lately with things like going to the store or being on the interstate [place] car [place].  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  She is interested in treatment for her anxiety as well as counseling.  Plan: - Start fluoxetine 20mg  daily.  Can titrate up as needed - Patient has information for [facility] and will contact them for services.

## 2017-10-07 LAB — BMP8+ANION GAP
ANION GAP: 19 mmol/L — AB (ref 10.0–18.0)
BUN/Creatinine Ratio: 15 (ref 9–23)
BUN: 9 mg/dL (ref 6–24)
CALCIUM: 8.9 mg/dL (ref 8.7–10.2)
CO2: 19 mmol/L — ABNORMAL LOW (ref 20–29)
Chloride: 100 mmol/L (ref 96–106)
Creatinine, Ser: 0.61 mg/dL (ref 0.57–1.00)
GFR calc Af Amer: 117 mL/min/{1.73_m2} (ref >59)
GFR, EST NON AFRICAN AMERICAN: 102 mL/min/{1.73_m2} (ref >59)
Glucose: 122 mg/dL — ABNORMAL HIGH (ref 65–99)
Potassium: 4 mmol/L (ref 3.5–5.2)
Sodium: 138 mmol/L (ref 134–144)

## 2017-10-07 NOTE — Progress Notes (Signed)
Internal Medicine Clinic Attending  Case discussed with Dr. Wallace at the time of the visit.  We reviewed the resident's history and exam and pertinent patient test results.  I agree with the assessment, diagnosis, and plan of care documented in the resident's note.  

## 2017-10-20 ENCOUNTER — Ambulatory Visit: Payer: Self-pay

## 2017-10-22 ENCOUNTER — Ambulatory Visit: Payer: Self-pay

## 2017-10-27 ENCOUNTER — Ambulatory Visit: Payer: Self-pay

## 2017-10-27 ENCOUNTER — Encounter: Payer: Self-pay | Admitting: Internal Medicine

## 2018-01-04 ENCOUNTER — Ambulatory Visit: Payer: Self-pay

## 2018-01-27 ENCOUNTER — Ambulatory Visit: Payer: Self-pay

## 2018-02-24 ENCOUNTER — Ambulatory Visit: Payer: Self-pay

## 2018-02-24 ENCOUNTER — Encounter: Payer: Self-pay | Admitting: Internal Medicine

## 2018-04-01 ENCOUNTER — Ambulatory Visit: Payer: Self-pay

## 2018-04-02 ENCOUNTER — Ambulatory Visit: Payer: Self-pay

## 2018-04-02 ENCOUNTER — Encounter: Payer: Self-pay | Admitting: Internal Medicine

## 2018-04-05 ENCOUNTER — Encounter: Payer: Self-pay | Admitting: Internal Medicine

## 2018-04-15 ENCOUNTER — Encounter: Payer: Self-pay | Admitting: Internal Medicine

## 2018-04-15 ENCOUNTER — Ambulatory Visit (INDEPENDENT_AMBULATORY_CARE_PROVIDER_SITE_OTHER): Payer: Self-pay | Admitting: Internal Medicine

## 2018-04-15 VITALS — BP 192/112 | HR 97 | Temp 98.2°F | Wt 211.8 lb

## 2018-04-15 DIAGNOSIS — Z96652 Presence of left artificial knee joint: Secondary | ICD-10-CM

## 2018-04-15 DIAGNOSIS — F418 Other specified anxiety disorders: Secondary | ICD-10-CM

## 2018-04-15 DIAGNOSIS — M1712 Unilateral primary osteoarthritis, left knee: Secondary | ICD-10-CM

## 2018-04-15 DIAGNOSIS — Z79899 Other long term (current) drug therapy: Secondary | ICD-10-CM

## 2018-04-15 DIAGNOSIS — Z87311 Personal history of (healed) other pathological fracture: Secondary | ICD-10-CM

## 2018-04-15 DIAGNOSIS — Z791 Long term (current) use of non-steroidal anti-inflammatories (NSAID): Secondary | ICD-10-CM

## 2018-04-15 DIAGNOSIS — H538 Other visual disturbances: Secondary | ICD-10-CM

## 2018-04-15 DIAGNOSIS — I10 Essential (primary) hypertension: Secondary | ICD-10-CM

## 2018-04-15 MED ORDER — FLUOXETINE HCL 20 MG PO CAPS: 20.0000 mg | ORAL_CAPSULE | Freq: Every day | ORAL | 2 refills | Status: DC

## 2018-04-15 MED ORDER — LISINOPRIL-HYDROCHLOROTHIAZIDE 20-25 MG PO TABS
1.0000 | ORAL_TABLET | Freq: Every day | ORAL | 2 refills | Status: DC
Start: 2018-04-15 — End: 2018-06-25

## 2018-04-15 MED ORDER — NAPROXEN 500 MG PO TABS: 500.0000 mg | ORAL_TABLET | Freq: Two times a day (BID) | ORAL | 0 refills | Status: DC

## 2018-04-15 MED FILL — FLUoxetine HCL 20 MG CAPS: 20 | 30 days supply | Qty: 30 | Fill #0

## 2018-04-15 MED FILL — LISINOPRIL-HCTZ 20-25 MG TA: 20-25 | 30 days supply | Qty: 30 | Fill #0

## 2018-04-15 NOTE — Assessment & Plan Note (Signed)
Uncontrolled symptoms; states she had symptomatic improvement while on fluoxetine in the past. No SI/HI.  Plan: --Restart fluoxetine 20mg  daily --f/u in 2wks

## 2018-04-15 NOTE — Patient Instructions (Signed)
Restart your lisinopril-hctz once a day and prozac once a day.  For your knee pain, continue icy-hot, and start taking naproxen 500mg  twice daily with meals.  Come back in 2 weeks to recheck your blood pressure and to check on your knee pain.

## 2018-04-15 NOTE — Assessment & Plan Note (Signed)
Patient with h/o left medial and lateral tibial plateau fractures s/p fixation now with progressive pain. On exam, her knee appears stable.   Plan: --start naproxen 500mg  BID with meals x2 wks --f/u in 2 wks; if still severe or any new signs of knee instability, consider Xray to assess hardware

## 2018-04-15 NOTE — Progress Notes (Signed)
   CC: left knee pain  HPI:  [name] is a 57 y.o. with a PMH of depression with anxiety, HTN presenting to clinic for left knee pain.  Left knee pain: Patient with h/o of left medial and lateral plateau fractures in 2006 s/p fixation, now presenting with months long history of progressive left knee pain. She denies recent trauma, injury or event surrounding onset of symptoms. Describes the pain as sharp, shooting, intermittent localized to the anterior and lateral knee with radiation to left hip at times. She feels she might have intermittent swelling; feels the knee gives out at times when she has the pain; denies warmth, fevers, weakness. She has tried max dose tylenol at home as well as icy-hot with slight improvement in her symptoms, however they are still significant.  HTN: Patient with h/o HTN; she has been out of meds for several months. She denies headaches, chest pain, shortness of breath, nausea, vomiting; she has noted gradual decline in her ability to read due to blurriness, but otherwise denies other visual symptoms.   Depression: Patient previously placed on fluoxetine 20mg  daily at last visit, however has been out for some months; she states that she did feel a benefit with the dose and is willing to restart. She feels her depressive symptoms have returned; endorses feelings of depressed mood, worthlessness as being the most troublesome for her at this time. She denies SI/HI. She has had therapy in the past and is hoping to start that again at some point.   Please see problem based Assessment and Plan for status of patients chronic conditions.  Past Medical History:  Diagnosis Date  . Bloody stool   . Dental decay   . Gastritis, Helicobacter pylori 03/23/8181  . Hypertension   . Pneumonia 06/2015  . Prepyloric ulcer 06/22/2015  . Seasonal allergies   . Shortness of breath dyspnea    with exertion    Review of Systems:   Per HPI  Physical Exam:  Vitals:   04/15/18 0850  BP: (!) 192/112  Pulse: 97  Temp: 98.2 F (36.8 C)  TempSrc: Oral  SpO2: 98%  Weight: 211 lb 12.8 oz (96.1 kg)   GENERAL- alert, co-operative, appears as stated age, not in any distress. CARDIAC- RRR, no murmurs, rubs or gallops. RESP- Moving equal volumes of air, and clear to auscultation bilaterally, no wheezes or crackles. ABDOMEN- Soft, nontender NEURO- CN 2-12 grossly intact. EXTREMITIES- pulse 2+, symmetric, no pedal edema. Left knee with well healed incisional scars; no obvious swelling; no erythema or increased warmth. Full ROM and strength intact at the knee; no crepitus. SKIN- Warm, dry.  Assessment & Plan:   See Encounters Tab for problem based charting.   Patient discussed with Dr. Gerrit Friends, MD Internal Medicine PGY-3

## 2018-04-15 NOTE — Assessment & Plan Note (Signed)
Uncontrolled, off of meds for months. She is asymptomatic. Labs in March with normal renal function.  Plan: --restart lisinopril-HCTZ 20-25mg  daily --f/u in 2 wks for bmet and BP check

## 2018-04-20 NOTE — Progress Notes (Signed)
Internal Medicine Clinic Attending  Case discussed with Dr. Svalina  at the time of the visit.  We reviewed the resident's history and exam and pertinent patient test results.  I agree with the assessment, diagnosis, and plan of care documented in the resident's note.  

## 2018-04-25 ENCOUNTER — Encounter: Payer: Self-pay | Admitting: *Deleted

## 2018-05-04 ENCOUNTER — Encounter: Payer: Self-pay | Admitting: Internal Medicine

## 2018-05-13 ENCOUNTER — Encounter: Payer: Self-pay | Admitting: Internal Medicine

## 2018-05-13 NOTE — Progress Notes (Deleted)
   CC: HTN   HPI:  [name] is a 57 y.o. female with PMHx listed below who presents for follow-up on HTN, anxiety and depression.   HTN: She had previously been off of medications for several months. Resumed Lisinopril-HCTZ 20-25 mg at previous visit on 9/26.   Depression: Started back on Fluoxetine 20 mg at last visit after being out for the last few months.   Knee pain: Naproxen 500 mg bid   Past Medical History:  Diagnosis Date  . Bloody stool   . Dental decay   . Gastritis, Helicobacter pylori 30/0/9233  . Hypertension   . Pneumonia 06/2015  . Prepyloric ulcer 06/22/2015  . Seasonal allergies   . Shortness of breath dyspnea    with exertion   Review of Systems:  ***  Physical Exam:  There were no vitals filed for this visit. ***  Assessment & Plan:   See Encounters Tab for problem based charting.  Patient {GC/GE:[id number]::"discussed with","seen with"} Dr. {NAMES:[id number]::"Butcher","Granfortuna","E. Hoffman","Klima","Mullen","Narendra","Raines","Vincent"}

## 2018-05-19 ENCOUNTER — Ambulatory Visit: Payer: Self-pay

## 2018-06-11 ENCOUNTER — Ambulatory Visit: Payer: Self-pay | Admitting: Internal Medicine

## 2018-06-14 ENCOUNTER — Encounter: Payer: Self-pay | Admitting: Internal Medicine

## 2018-06-25 ENCOUNTER — Ambulatory Visit: Payer: Self-pay | Admitting: Pharmacist

## 2018-06-25 DIAGNOSIS — F418 Other specified anxiety disorders: Secondary | ICD-10-CM

## 2018-06-25 DIAGNOSIS — I10 Essential (primary) hypertension: Secondary | ICD-10-CM

## 2018-06-25 MED ORDER — FLUOXETINE HCL 20 MG PO CAPS: 20.0000 mg | ORAL_CAPSULE | Freq: Every day | ORAL | 2 refills | Status: DC

## 2018-06-25 MED ORDER — LISINOPRIL-HYDROCHLOROTHIAZIDE 20-25 MG PO TABS: 1.0000 | ORAL_TABLET | Freq: Every day | ORAL | 2 refills | Status: DC

## 2018-06-25 NOTE — Progress Notes (Addendum)
S: [name] [name] is a 57 y.o. female reports to clinical pharmacist appointment for BP and help with medication management.  Allergies  Allergen Reactions  . Tetracyclines & Related Itching   Medication Sig  acetaminophen (TYLENOL) 325 MG tablet Take 650 mg by mouth every 6 (six) hours as needed for mild pain or moderate pain. Reported on 08/14/2015  FLUoxetine (PROZAC) 20 MG capsule Take 1 capsule (20 mg total) by mouth daily.  lisinopril-hydrochlorothiazide (PRINZIDE,ZESTORETIC) 20-25 MG tablet Take 1 tablet by mouth daily.  naproxen (NAPROSYN) 500 MG tablet Take 1 tablet (500 mg total) by mouth 2 (two) times daily with a meal.   Past Medical History:  Diagnosis Date  . Bloody stool   . Dental decay   . Gastritis, Helicobacter pylori 09/22/7423  . Hypertension   . Pneumonia 06/2015  . Prepyloric ulcer 06/22/2015  . Seasonal allergies   . Shortness of breath dyspnea    with exertion   Social History   Socioeconomic History  . Marital status: Divorced    Spouse name: Not on file  . Number of children: 4  . Years of education: Not on file  . Highest education level: Not on file  Occupational History  . 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  Social Needs  . Financial resource strain: Not on file  . Food insecurity:    Worry: Not on file    Inability: Not on file  . Transportation needs:    Medical: Not on file    Non-medical: Not on file  Tobacco Use  . Smoking status: Never Smoker  . Smokeless tobacco: Never Used  Substance and Sexual Activity  . Alcohol use: Yes    Alcohol/week: 0.0 standard drinks    Comment: 48 oz malt liquor when drinks, but not daily.  Difficult to pin down; last drink was 06/17/15  . Drug use: No  . Sexual activity: Yes  Lifestyle  . Physical activity:    Days per week: Not on file    Minutes per session: Not on file  . Stress: Not on file  Relationships  . Social  connections:    Talks on phone: Not on file    Gets together: Not on file    Attends religious service: Not on file    Active member of club or organization: Not on file    Attends meetings of clubs or organizations: Not on file    Relationship status: Not on file  Other Topics Concern  . Not on file  Social History Narrative   Born in [place], but grew up in [place]   Was homeless Sept. 2016, when initially established   Now has own apartment, lives alone   Brother and 2 sons live in town, improving relationships.   States ended up homeless due to difficulty with living situation when children moved to [place] to live with her--she ended up leaving to get away from them and did not have a job.     Not ready to discuss details   Family History  Problem Relation Age of Onset  . Colon cancer Mother 59  . Hypertension Mother   . Hypertension Father   . Cerebral aneurysm Sister   . Goiter Sister    O:    Component Value Date/Time   CHOL  03/15/2010 0447    179        ATP III CLASSIFICATION:  <200     mg/dL  Desirable  200-239  mg/dL   Borderline High  >=240    mg/dL   High          HDL 76 03/15/2010 0447   LDLCALC  03/15/2010 0447    91        Total Cholesterol/HDL:CHD Risk Coronary Heart Disease Risk Table                     Men   Women  1/2 Average Risk   3.4   3.3  Average Risk       5.0   4.4  2 X Average Risk   9.6   7.1  3 X Average Risk  23.4   11.0        Use the calculated Patient Ratio above and the CHD Risk Table to determine the patient's CHD Risk.        ATP III CLASSIFICATION (LDL):  <100     mg/dL   Optimal  100-129  mg/dL   Near or Above                    Optimal  130-159  mg/dL   Borderline  160-189  mg/dL   High  >190     mg/dL   Very High   TRIG 58 03/15/2010 0447   GLUCOSE 122 (H) 10/06/2017 1052   GLUCOSE 102 (H) 06/02/2015 1906   HGBA1C  03/15/2010 0447    5.3 (NOTE)                                                                        According to the ADA Clinical Practice Recommendations for 2011, when HbA1c is used as a screening test:   >=6.5%   Diagnostic of Diabetes Mellitus           (if abnormal result  is confirmed)  5.7-6.4%   Increased risk of developing Diabetes Mellitus  References:Diagnosis and Classification of Diabetes Mellitus,Diabetes PYPP,5093,26(ZTIWP 1):S62-S69 and Standards of Medical Care in         Diabetes - 2011,Diabetes Care,2011,34  (Suppl 1):S11-S61.   NA 138 10/06/2017 1052   K 4.0 10/06/2017 1052   CL 100 10/06/2017 1052   CO2 19 (L) 10/06/2017 1052   BUN 9 10/06/2017 1052   CREATININE 0.61 10/06/2017 1052   CALCIUM 8.9 10/06/2017 1052   GFRNONAA 102 10/06/2017 1052   GFRAA 117 10/06/2017 1052   AST 20 06/20/2015 1033   ALT 19 06/20/2015 1033   WBC 8.1 06/20/2015 1033   WBC 6.0 06/02/2015 1906   HGB 11.1 06/20/2015 1033   HCT 32.1 (L) 06/20/2015 1033   PLT 276 06/20/2015 1033   TSH 3.893 04/09/2011 0629   Ht Readings from Last 2 Encounters:  10/06/17 5\' 3"  (1.6 m)  08/14/15 5\' 4"  (1.626 m)   Wt Readings from Last 2 Encounters:  04/15/18 211 lb 12.8 oz (96.1 kg)  10/06/17 214 lb 1.6 oz (97.1 kg)   There is no height or weight on file to calculate BMI. BP Readings from Last 3 Encounters:  06/25/18 (!) 157/84  04/15/18 (!) 192/112  10/06/17 (!) 174/93    A/P:  Patient reports for help with BP management, in-between orange  card renewal. She appears well and in no distress. Hypertension ROS: not taking lisinopril-HCTZ, no symptoms of concern today (denies chest pain, shortness of breath, swelling, dizziness or lightheadedness).  She ran out of lisinopril-HCTZ due to challenges with cost, advised patient to restart this medication, referred to [facility] pharmacy She is currently taking fluoxetine, but states she has not noticed improvement in her depression/anxiety symptoms. Will discuss with PCP and clinic integrated behavioral specialist to see if we can work more  closely with patient. She does report general body muscle and joint aching and requested recommendations. Provided education on OTC options including topical agents to help with pain, and also advised on safe use of acetaminophen/naproxen.  An after visit summary was provided and patient advised to follow up in 2-4 weeks or sooner if any changes in condition or questions regarding medications arise.   The patient verbalized understanding of information provided by repeating back concepts discussed.

## 2018-06-29 ENCOUNTER — Telehealth: Payer: Self-pay | Admitting: Licensed Clinical Social Worker

## 2018-06-29 NOTE — Telephone Encounter (Signed)
Patient was contacted due to a referral for [facility] services. Patient was scheduled for 07/01/18.

## 2018-07-01 ENCOUNTER — Encounter: Payer: Self-pay | Admitting: Licensed Clinical Social Worker

## 2018-07-01 ENCOUNTER — Ambulatory Visit: Payer: Self-pay | Admitting: Licensed Clinical Social Worker

## 2018-07-01 DIAGNOSIS — F419 Anxiety disorder, unspecified: Secondary | ICD-10-CM

## 2018-07-01 DIAGNOSIS — F1010 Alcohol abuse, uncomplicated: Secondary | ICD-10-CM
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
## 2018-07-28 ENCOUNTER — Other Ambulatory Visit: Payer: Self-pay | Admitting: *Deleted

## 2018-07-28 ENCOUNTER — Other Ambulatory Visit: Payer: Self-pay | Admitting: Pharmacist

## 2018-07-28 DIAGNOSIS — F418 Other specified anxiety disorders: Secondary | ICD-10-CM

## 2018-07-28 DIAGNOSIS — I10 Essential (primary) hypertension: Secondary | ICD-10-CM

## 2018-07-28 MED ORDER — FLUOXETINE HCL 20 MG PO CAPS: 20.0000 mg | ORAL_CAPSULE | Freq: Every day | ORAL | 2 refills | Status: DC

## 2018-07-28 MED ORDER — LISINOPRIL-HYDROCHLOROTHIAZIDE 20-25 MG PO TABS: 1.0000 | ORAL_TABLET | Freq: Every day | ORAL | 2 refills | Status: DC

## 2018-07-28 NOTE — Telephone Encounter (Signed)
Call from pt-states she was recently approved for [facility], but they have not received her prescriptions.  States Dr Maudie Mercury has been working with her to obtain medication assistance.   Pt has the following requests 1. appt for left knee pain (appt given for 07/30/18 in [facility]) 2. appt with Dr Maudie Mercury for help with meds (appt given for 07/30/18) 3. appt with [facility] for follow up (pt unable to see [place] on 07/30/2018, but was given number to call)   Pt also aware that she needs to schedule visit with her pcp and to obtain documentation for financial assistance.  No further action needed at this time, phone call complete.[name] Hidden [name]1/8/20203:04 PM

## 2018-07-28 NOTE — Progress Notes (Signed)
Patient was approved for free medications through [facility].

## 2018-07-30 ENCOUNTER — Ambulatory Visit (HOSPITAL_COMMUNITY)
Admission: RE | Admit: 2018-07-30 | Discharge: 2018-07-30 | Disposition: A | Discharge status: Home / Self Care | Payer: Self-pay | Source: Ambulatory Visit | Attending: Student in an Organized Health Care Education/Training Program | Admitting: Student in an Organized Health Care Education/Training Program

## 2018-07-30 ENCOUNTER — Other Ambulatory Visit: Payer: Self-pay

## 2018-07-30 ENCOUNTER — Ambulatory Visit (INDEPENDENT_AMBULATORY_CARE_PROVIDER_SITE_OTHER): Payer: Self-pay | Admitting: Internal Medicine

## 2018-07-30 ENCOUNTER — Ambulatory Visit: Payer: Self-pay | Admitting: Pharmacist

## 2018-07-30 DIAGNOSIS — M1712 Unilateral primary osteoarthritis, left knee: Secondary | ICD-10-CM | POA: Insufficient documentation

## 2018-07-30 DIAGNOSIS — Z967 Presence of other bone and tendon implants: Secondary | ICD-10-CM

## 2018-07-30 DIAGNOSIS — F418 Other specified anxiety disorders: Secondary | ICD-10-CM

## 2018-07-30 DIAGNOSIS — Z79899 Other long term (current) drug therapy: Secondary | ICD-10-CM

## 2018-07-30 DIAGNOSIS — R002 Palpitations: Secondary | ICD-10-CM

## 2018-07-30 DIAGNOSIS — I10 Essential (primary) hypertension: Secondary | ICD-10-CM

## 2018-07-30 MED ORDER — LISINOPRIL-HYDROCHLOROTHIAZIDE 20-25 MG PO TABS: 1.0000 | ORAL_TABLET | Freq: Every day | ORAL | 0 refills | Status: DC

## 2018-07-30 MED ORDER — NAPROXEN 500 MG PO TABS: 500.0000 mg | ORAL_TABLET | Freq: Two times a day (BID) | ORAL | 0 refills | Status: DC | PRN

## 2018-07-30 MED ORDER — FLUOXETINE HCL 20 MG PO CAPS: 20.0000 mg | ORAL_CAPSULE | Freq: Every day | ORAL | 0 refills | Status: DC

## 2018-07-30 MED FILL — FLUoxetine HCL 20 MG CAPS: 20 | 30 days supply | Qty: 30 | Fill #0

## 2018-07-30 MED FILL — LISINOPRIL-HCTZ 20-25 MG TA: 20-25 | 30 days supply | Qty: 30 | Fill #0

## 2018-07-30 NOTE — Assessment & Plan Note (Addendum)
Intermittent sharp pain on left knee when she gets up from sitting position or going up so many stairs. No fever. She takes Aleve some times for it and it does not help all the time. Knee brace gives some relief.  Has crepitation, lateral and medial laxity, swelling, and medial knee tenderness at the left knee. 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 LVM for patient asked to call back.

## 2018-07-30 NOTE — Progress Notes (Signed)
   CC: HTN follow up and knee pain  HPI:  [name] [name] is a 58 y.o. female with past medical history listed below, came into clinic today for follow-up of hypertension, also complaining of left knee pain.  Please see problem based charting for further details and assessment and plan.  Past Medical History:  Diagnosis Date  . Bloody stool   . Dental decay   . Gastritis, Helicobacter pylori 95/07/8839  . Hypertension   . Pneumonia 06/2015  . Prepyloric ulcer 06/22/2015  . Seasonal allergies   . Shortness of breath dyspnea    with exertion   Family Hx: HTN        Father HTN      Mother Colon cancer        Mother  Social Hx: [name] reports some problem getting her medications before due to transportation issue or financial situation. Our pharmacy team is in the loop and she is eager to get her medications now. She does not smoke. No illicit drug use. She drinks 2 beers /day on weekends.  Review of Systems: Review of Systems  Constitutional: Negative for chills and fever.  Respiratory: Negative for cough.   Cardiovascular: Positive for palpitations. Negative for chest pain.  Gastrointestinal: Negative for abdominal pain, diarrhea, heartburn, nausea and vomiting.  Neurological: Negative for dizziness and headaches.   Physical Exam:  Vitals:   07/30/18 0922  BP: (!) 186/95  Pulse: 98  Temp: 97.6 F (36.4 C)  TempSrc: Oral  SpO2: 99%  Weight: 215 lb 4.8 oz (97.7 kg)  Height: 5\' 3"  (1.6 m)   Physical Exam Vitals signs reviewed.  Constitutional:      Appearance: Normal appearance.  HENT:     Head: Normocephalic and atraumatic.  Eyes:     Extraocular Movements: Extraocular movements intact.  Cardiovascular:     Rate and Rhythm: Normal rate and regular rhythm.     Pulses: Normal pulses.     Heart sounds: No murmur.  Pulmonary:     Effort: Pulmonary effort is normal.     Breath sounds: Normal breath sounds.  Abdominal:     Palpations: Abdomen is soft.   Tenderness: There is no abdominal tenderness.  Musculoskeletal:        Surgical scar inferior to left knee     Left knee: mild Swelling present.  Has crepitus. She exhibits abnormal alignment, LCL laxity and MCL laxity.  Medial joint line tenderness noted.     Right lower leg: No edema.     Left lower leg: No edema.  Neurological:     General: No focal deficit present.     Mental Status: She is alert and oriented to person, place, and time.  Psychiatric:        Mood and Affect: Mood normal.        Behavior: Behavior normal.    Assessment & Plan:   See Encounters Tab for problem based charting.  Patient seen with Dr. Evette Doffing

## 2018-07-30 NOTE — Assessment & Plan Note (Addendum)
The patient's blood pressure during this visit was 186/95. She has not taken blood pressure medication since November (Could not fill the prescription from [facility]. Our pharmacy office has been working on  Providing her the medication supply from [facility].  His last blood pressure visits are  BP Readings from Last 3 Encounters:  07/30/18 (!) 186/95  06/25/18 (!) 157/84  04/15/18 (!) 192/112   The patient does not report palpitations, dizziness, chest pain, sob .  -BMP -I send the prescription for Lisinopril-HCTZ 20-25 mg QD to [facility] out patient pharmacy today (Per Dr. Maudie Mercury, should send next refills to [facility] pharmacy for ongoing medicat) -Follow-up in 2 weeks

## 2018-07-30 NOTE — Patient Instructions (Addendum)
Thank you for allowing [place] to provide your care today. Today we discussed your high blood pressure, anxiety and knee pain. You blood pressure is high today. I understand that you could not fill your prescription. As we talked, it is very important to take your pill and control your blood pressure. We will follow up with you to make sure you have access to medications. Also try to eat low salt diet and lose weight.  I send prescription for your blood pressure pill and anxiety pill also Naproxen as needed for your knee pain to our outpatient pharmacy. Try to take Naproxen only on the days that you have bad pain.   I have ordered blood test and X ray for your knee for you. I will call you for the result.    Please follow-up in 2 weeks for BP check.  Should you have any questions or concerns please call the internal medicine clinic at [phone].

## 2018-07-30 NOTE — Assessment & Plan Note (Signed)
Has been off of her medications since November.  Reports some anxiety anxiety. -Refilled fluoxetine 20 mg daily -We will follow-up in 2 weeks

## 2018-07-30 NOTE — Progress Notes (Signed)
Internal Medicine Clinic Attending  I saw and evaluated the patient.  I personally confirmed the key portions of the history and exam documented by Dr. Masoudi and I reviewed pertinent patient test results.  The assessment, diagnosis, and plan were formulated together and I agree with the documentation in the resident's note. 

## 2018-08-02 ENCOUNTER — Telehealth: Payer: Self-pay

## 2018-08-02 NOTE — Telephone Encounter (Signed)
Requesting x-ray results, please call pt back.  

## 2018-08-03 ENCOUNTER — Encounter: Payer: Self-pay | Admitting: Internal Medicine

## 2018-08-03 NOTE — Telephone Encounter (Signed)
Pt had called from this telephone# [phone].

## 2018-08-03 NOTE — Telephone Encounter (Signed)
I called patient on Friday, Monday and today. (Her brother's phone number that is available on chart). She has not been available. If she called again, would you please asks if she wants [place] to leave a message for his brother about the X ray result? (I hesitated to do so). Or you can let her know that X ray showed osteoarthritis of the knee as expected.  Thanks

## 2018-08-04 NOTE — Telephone Encounter (Signed)
I talked to [name]. Discussed her X ray result..  Thanks

## 2018-08-12 ENCOUNTER — Ambulatory Visit: Payer: Self-pay | Admitting: Licensed Clinical Social Worker

## 2018-08-13 ENCOUNTER — Ambulatory Visit: Payer: Self-pay

## 2018-08-18 ENCOUNTER — Ambulatory Visit: Payer: Self-pay

## 2018-08-18 ENCOUNTER — Encounter: Payer: Self-pay | Admitting: Internal Medicine

## 2018-08-24 ENCOUNTER — Encounter: Payer: Self-pay | Admitting: Internal Medicine

## 2018-09-08 ENCOUNTER — Ambulatory Visit: Payer: Self-pay

## 2018-09-13 ENCOUNTER — Ambulatory Visit: Payer: Self-pay

## 2019-01-20 ENCOUNTER — Ambulatory Visit (INDEPENDENT_AMBULATORY_CARE_PROVIDER_SITE_OTHER): Payer: Self-pay | Admitting: Primary Care

## 2019-01-20 ENCOUNTER — Ambulatory Visit: Payer: Self-pay | Attending: Primary Care | Admitting: Licensed Clinical Social Worker

## 2019-01-20 ENCOUNTER — Other Ambulatory Visit: Payer: Self-pay

## 2019-01-20 ENCOUNTER — Encounter (INDEPENDENT_AMBULATORY_CARE_PROVIDER_SITE_OTHER): Payer: Self-pay | Admitting: Primary Care

## 2019-01-20 VITALS — BP 172/111 | HR 103 | Temp 98.2°F | Ht 63.0 in

## 2019-01-20 DIAGNOSIS — I10 Essential (primary) hypertension: Secondary | ICD-10-CM

## 2019-01-20 DIAGNOSIS — F419 Anxiety disorder, unspecified: Secondary | ICD-10-CM

## 2019-01-20 DIAGNOSIS — F322 Major depressive disorder, single episode, severe without psychotic features: Secondary | ICD-10-CM

## 2019-01-20 DIAGNOSIS — Z7689 Persons encountering health services in other specified circumstances: Secondary | ICD-10-CM

## 2019-01-20 DIAGNOSIS — Z0000 Encounter for general adult medical examination without abnormal findings: Secondary | ICD-10-CM

## 2019-01-20 DIAGNOSIS — Z1211 Encounter for screening for malignant neoplasm of colon: Secondary | ICD-10-CM

## 2019-01-20 DIAGNOSIS — F1010 Alcohol abuse, uncomplicated: Secondary | ICD-10-CM

## 2019-01-20 DIAGNOSIS — M1712 Unilateral primary osteoarthritis, left knee: Secondary | ICD-10-CM

## 2019-01-20 MED ORDER — LISINOPRIL-HYDROCHLOROTHIAZIDE 20-25 MG PO TABS: 1.0000 | ORAL_TABLET | Freq: Every day | ORAL | 3 refills | Status: DC

## 2019-01-20 MED ORDER — HYDROXYZINE HCL 25 MG PO TABS: 10.0000 mg | ORAL_TABLET | Freq: Every day | ORAL | 0 refills | Status: DC

## 2019-01-20 NOTE — Progress Notes (Signed)
New Patient Office Visit  Subjective:  Patient ID: [name] [name], female    DOB: 01-11-1961  Age: 58 y.o. MRN: [id number]  CC:  Chief Complaint  Patient presents with  . New Patient (Initial Visit)    HPI [name] [name] presents for establishment of care she presents today very tearful and crying and feeling a burden to her children and and mistreated.  She has met with the clinical social worker and will have follow-up sessions to discuss depression, alcoholism and history of drug abuse.  Also complains of left knee pain and requesting something for pain and anxiety.  Past medical history shown below:  Past Medical History:  Diagnosis Date  . Bloody stool   . Dental decay   . Gastritis, Helicobacter pylori 32/03/9241  . Hypertension   . Pneumonia 06/2015  . Prepyloric ulcer 06/22/2015  . Seasonal allergies   . Shortness of breath dyspnea    with exertion    Past Surgical History:  Procedure Laterality Date  . BARTHOLIN CYST MARSUPIALIZATION Left 1997  . CARDIAC SURGERY N/A 1963   Repair of PFO  . COLONOSCOPY N/A 2002   Performed secondary to mother's diagnosis of colon CA at age 58  . ESOPHAGOGASTRODUODENOSCOPY (EGD) WITH PROPOFOL N/A 06/22/2015   Procedure: ESOPHAGOGASTRODUODENOSCOPY (EGD) WITH PROPOFOL;  Surgeon: Arta Silence, MD;  Location: [facility] ENDOSCOPY;  Service: Endoscopy;  Laterality: N/A;  . FRACTURE SURGERY    . NASAL SINUS SURGERY Bilateral 1984  . OVARIAN CYST REMOVAL Left 1990  . TUBAL LIGATION    . UMBILICAL HERNIA REPAIR N/A 2005    Family History  Problem Relation Age of Onset  . Colon cancer Mother 49  . Hypertension Mother   . Hypertension Father   . Cerebral aneurysm Sister   . Goiter Sister     Social History   Socioeconomic History  . Marital status: Divorced    Spouse name: Not on file  . Number of children: 4  . Years of education: Not on file  . Highest education level: Not on file  Occupational History  . 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  Social Needs  . Financial resource strain: Not on file  . Food insecurity    Worry: Not on file    Inability: Not on file  . Transportation needs    Medical: Not on file    Non-medical: Not on file  Tobacco Use  . Smoking status: Never Smoker  . Smokeless tobacco: Never Used  Substance and Sexual Activity  . Alcohol use: Yes    Alcohol/week: 0.0 standard drinks    Comment: 48 oz malt liquor when drinks, but not daily.  Difficult to pin down; last drink was 06/17/15  . Drug use: No  . Sexual activity: Yes  Lifestyle  . Physical activity    Days per week: Not on file    Minutes per session: Not on file  . Stress: Not on file  Relationships  . Social Herbalist on phone: Not on file    Gets together: Not on file    Attends religious service: Not on file    Active member of club or organization: Not on file    Attends meetings of clubs or organizations: Not on file    Relationship status: Not on file  . Intimate partner violence    Fear of current or ex partner: Not on file  Emotionally abused: Not on file    Physically abused: Not on file    Forced sexual activity: Not on file  Other Topics Concern  . Not on file  Social History Narrative   Born in [place], but grew up in [place]   Was homeless Sept. 2016, when initially established   Now has own apartment, lives alone   Brother and 2 sons live in town, improving relationships.   States ended up homeless due to difficulty with living situation when children moved to [place] to live with her--she ended up leaving to get away from them and did not have a job.     Not ready to discuss details    ROS Review of Systems  Constitutional: Positive for activity change and fatigue.  HENT: Negative.   Respiratory: Positive for shortness of breath.        With excertion  Musculoskeletal:       Knee pain  crepitus present  Psychiatric/Behavioral: Positive for behavioral problems and sleep disturbance. The patient is nervous/anxious.   All other systems reviewed and are negative.   Objective:   Today's Vitals: BP (!) 172/111 (BP Location: Left Arm, Patient Position: Sitting, Cuff Size: Normal)   Pulse (!) 103   Temp 98.2 F (36.8 C) (Oral)   Ht '5\' 3"'  (1.6 m)   SpO2 95%   BMI 38.14 kg/m   Physical Exam Constitutional:      General: She is in acute distress.     Appearance: She is obese.  HENT:     Head: Normocephalic and atraumatic.  Neck:     Musculoskeletal: Normal range of motion and neck supple.  Cardiovascular:     Rate and Rhythm: Regular rhythm. Tachycardia present.  Pulmonary:     Effort: Pulmonary effort is normal.     Breath sounds: Normal breath sounds.  Abdominal:     General: Bowel sounds are normal.     Palpations: Abdomen is soft.  Musculoskeletal:        General: Tenderness present.     Comments: Right knee crepitus  Skin:    General: Skin is warm.  Neurological:     Mental Status: She is alert and oriented to person, place, and time.  Psychiatric:     Comments: Very emotional intermittent bouts of crying frustration and anger     Assessment & Plan:   Problem List Items Addressed This Visit    Alcohol use disorder   Healthcare maintenance   Relevant Orders   Lipid panel   Comprehensive metabolic panel   CBC with Differential/Platelet   Arthritis of left knee    Other Visit Diagnoses    Special screening for malignant neoplasms, colon    -  Primary   Relevant Orders   Fecal occult blood, imunochemical([facility]/Sunquest)   Encounter to establish care          Outpatient Encounter Medications as of 01/20/2019  Medication Sig  . acetaminophen (TYLENOL) 325 MG tablet Take 650 mg by mouth every 6 (six) hours as needed for mild pain or moderate pain. Reported on 08/14/2015  . FLUoxetine (PROZAC) 20 MG capsule Take 1 capsule (20 mg total) by mouth  daily. (Patient not taking: Reported on 01/20/2019)  . hydrOXYzine (ATARAX/VISTARIL) 25 MG tablet Take 0.5 tablets (12.5 mg total) by mouth at bedtime.  Marland Kitchen lisinopril-hydrochlorothiazide (ZESTORETIC) 20-25 MG tablet Take 1 tablet by mouth daily.  . [DISCONTINUED] lisinopril-hydrochlorothiazide (PRINZIDE,ZESTORETIC) 20-25 MG tablet Take 1 tablet by mouth daily. (Patient not taking:  Reported on 01/20/2019)  . [DISCONTINUED] naproxen (NAPROSYN) 500 MG tablet Take 1 tablet (500 mg total) by mouth 2 (two) times daily between meals as needed.   No facility-administered encounter medications on file as of 01/20/2019.   Amanie was seen today for new patient (initial visit).  Diagnoses and all orders for this visit:  Special screening for malignant neoplasms, colon  Normal colon cancer screening.  CDC recommends colorectal screening from ages 86-75. Screening can begin at 25 or earlier in some cases. This screening is used for a disease when no symptoms are present . Diagnostic test is used for symptoms examples blood in stool, colorectal polyps or coloector cancer, family history or inflammatory bowel disease - chron's or ulcerative colitis .(USPSTF) -     Fecal occult blood, imunochemical([facility]/Sunquest); Future  Encounter to establish care Patient is here today to establish care and to inquire about treatment for depression and anxiety.  Appointment has also been scheduled with the clinical social worker to further assist with treatment options referrals in therapy  Alcohol use disorder Discussed alcohol does not solve the solutions but can create more problems.  Advised to contact [facility] or substances abuse example alcohol to encourage cessation.  Arthritis of left knee Crepitus is prominent in the right knee will need to be referred to orthopedist  Healthcare maintenance -     Lipid panel; Future -     Comprehensive metabolic panel; Future -     CBC with Differential/Platelet; Future  Other  orders -     lisinopril-hydrochlorothiazide (ZESTORETIC) 20-25 MG tablet; Take 1 tablet by mouth daily. -     hydrOXYzine (ATARAX/VISTARIL) 25 MG tablet; Take 0.5 tablets (12.5 mg total) by mouth at bedtime.    Follow-up: Return in about 3 weeks (around 02/10/2019) for HTN/ fasting labs.   Kerin Perna, NP

## 2019-01-20 NOTE — BH Specialist Note (Addendum)
Integrated Behavioral Health Initial Visit  MRN: [id number] Name: [name] [name]  Number of [place] visits:: 1/6 Session Start time: 4:00 PM  Session End time: 4:30 PM Total time: 30 minutes  Type of Service: [place] Interpretor:No. Interpretor Name and Language: NA   Warm Hand Off Completed.       SUBJECTIVE: [name] [name] is a 58 y.o. female accompanied by self Patient was referred by NP Edwards for anxiety, depression, and substance use. Patient reports the following symptoms/concerns: Pt reports difficulty managing anxiety and depression symptoms. She is experiencing family strain and wishes to decrease substance use Duration of problem: Ongoing; Severity of problem: severe  OBJECTIVE: Mood: Anxious and Depressed and Affect: Tearful Risk of harm to self or others: No plan to harm self or others  LIFE CONTEXT: Family and Social: Pt has stable housing, allows adult son to reside with her. She has additional adult children that reside locally School/Work: Pt was denied disability (Nov 2019) She is uninsured. Has lawyer to assist with disability appeal Self-Care: Pt drinks alcohol (two 40 oz on "bad days") to cope with stressors Life Changes: Pt is experiencing family strain triggering depression and anxiety symptoms. She is dependent on alcohol and is interested in decreasing usage  GOALS ADDRESSED: Patient will: 1. Reduce symptoms of: anxiety and depression 2. Increase knowledge and/or ability of: coping skills and healthy habits  3. Demonstrate ability to: Increase healthy adjustment to current life circumstances, Increase adequate support systems for patient/family and Decrease self-medicating behaviors  INTERVENTIONS: Interventions utilized: Motivational Interviewing, Supportive Counseling and Psychoeducation and/or Health Education  Standardized Assessments completed: GAD-7 and PHQ 2&9  ASSESSMENT: Patient  currently experiencing depression and anxiety symptoms triggered by ongoing family strain with adult children and limited management of high blood pressure. She reports difficulty sleeping, low motivation, low energy, feeling bad about self, and substance use (alcohol). Pt denies suicidal/homicidal ideations. She receives strong emotional support from her brother and identified grandchildren as motivation agents.    Patient may benefit from psychotherapy and substance use treatment. LCSW educated pt on the correlation between's one's physical and mental health, in addition, to how substance use can negatively impact both. Therapeutic interventions were utilized to increase motivation for change. Support and encouragement was provided.   Pt disclosed ongoing pain in her knees and hx of falling. LCSW will consult with RN CM, Eden Lathe to assist with providing pt with a cane  PLAN: 1. Follow up with behavioral health clinician on : Pt was encouraged to contact LCSW if symptoms worsen or fail to improve to schedule behavioral appointments 2. Behavioral recommendations: LCSW recommends that pt apply healthy coping skills discussed. Pt is encouraged to schedule follow up appointment with LCSW 3. Referral(s): [place] (In Clinic) 4. "From scale of 1-10, how likely are you to follow plan?": 4  Rebekah Chesterfield, LCSW 01/26/2019 10:45 AM

## 2019-01-20 NOTE — Patient Instructions (Signed)

## 2019-01-26 ENCOUNTER — Telehealth: Payer: Self-pay | Admitting: Licensed Clinical Social Worker

## 2019-01-26 NOTE — Telephone Encounter (Signed)
Call placed to patient to inform her that requested cane is ready for pick up. A detailed message was left with pt's brother, Gwenlyn Perking, to have pt return call.

## 2019-02-02 ENCOUNTER — Other Ambulatory Visit (HOSPITAL_COMMUNITY): Payer: Self-pay | Admitting: *Deleted

## 2019-02-02 DIAGNOSIS — Z1231 Encounter for screening mammogram for malignant neoplasm of breast: Secondary | ICD-10-CM

## 2019-02-10 ENCOUNTER — Ambulatory Visit (INDEPENDENT_AMBULATORY_CARE_PROVIDER_SITE_OTHER): Payer: Self-pay | Admitting: Primary Care

## 2019-02-10 ENCOUNTER — Encounter (INDEPENDENT_AMBULATORY_CARE_PROVIDER_SITE_OTHER): Payer: Self-pay | Admitting: Primary Care

## 2019-02-10 ENCOUNTER — Other Ambulatory Visit: Payer: Self-pay

## 2019-02-10 ENCOUNTER — Ambulatory Visit: Payer: Self-pay | Attending: Primary Care | Admitting: Licensed Clinical Social Worker

## 2019-02-10 VITALS — BP 142/84 | HR 89 | Temp 97.6°F | Ht 63.0 in | Wt 216.0 lb

## 2019-02-10 DIAGNOSIS — F331 Major depressive disorder, recurrent, moderate: Secondary | ICD-10-CM

## 2019-02-10 DIAGNOSIS — Z599 Problem related to housing and economic circumstances, unspecified: Secondary | ICD-10-CM

## 2019-02-10 DIAGNOSIS — Z13220 Encounter for screening for lipoid disorders: Secondary | ICD-10-CM

## 2019-02-10 DIAGNOSIS — F418 Other specified anxiety disorders: Secondary | ICD-10-CM

## 2019-02-10 DIAGNOSIS — I10 Essential (primary) hypertension: Secondary | ICD-10-CM

## 2019-02-10 DIAGNOSIS — Z131 Encounter for screening for diabetes mellitus: Secondary | ICD-10-CM

## 2019-02-10 DIAGNOSIS — F419 Anxiety disorder, unspecified: Secondary | ICD-10-CM

## 2019-02-10 DIAGNOSIS — Z598 Other problems related to housing and economic circumstances: Secondary | ICD-10-CM

## 2019-02-10 NOTE — Patient Instructions (Signed)
Managing Your Hypertension Hypertension is commonly called high blood pressure. This is when the force of your blood pressing against the walls of your arteries is too strong. Arteries are blood vessels that carry blood from your heart throughout your body. Hypertension forces the heart to work harder to pump blood, and may cause the arteries to become narrow or stiff. Having untreated or uncontrolled hypertension can cause heart attack, stroke, kidney disease, and other problems. What are blood pressure readings? A blood pressure reading consists of a higher number over a lower number. Ideally, your blood pressure should be below 120/80. The first ("top") number is called the systolic pressure. It is a measure of the pressure in your arteries as your heart beats. The second ("bottom") number is called the diastolic pressure. It is a measure of the pressure in your arteries as the heart relaxes. What does my blood pressure reading mean? Blood pressure is classified into four stages. Based on your blood pressure reading, your health care provider may use the following stages to determine what type of treatment you need, if any. Systolic pressure and diastolic pressure are measured in a unit called mm Hg. Normal  Systolic pressure: below 120.  Diastolic pressure: below 80. Elevated  Systolic pressure: 120-129.  Diastolic pressure: below 80. Hypertension stage 1  Systolic pressure: 130-139.  Diastolic pressure: 80-89. Hypertension stage 2  Systolic pressure: 140 or above.  Diastolic pressure: 90 or above. What health risks are associated with hypertension? Managing your hypertension is an important responsibility. Uncontrolled hypertension can lead to:  A heart attack.  A stroke.  A weakened blood vessel (aneurysm).  Heart failure.  Kidney damage.  Eye damage.  Metabolic syndrome.  Memory and concentration problems. What changes can I make to manage my  hypertension? Hypertension can be managed by making lifestyle changes and possibly by taking medicines. Your health care provider will help you make a plan to bring your blood pressure within a normal range. Eating and drinking   Eat a diet that is high in fiber and potassium, and low in salt (sodium), added sugar, and fat. An example eating plan is called the DASH (Dietary Approaches to Stop Hypertension) diet. To eat this way: ? Eat plenty of fresh fruits and vegetables. Try to fill half of your plate at each meal with fruits and vegetables. ? Eat whole grains, such as whole wheat pasta, brown rice, or whole grain bread. Fill about one quarter of your plate with whole grains. ? Eat low-fat diary products. ? Avoid fatty cuts of meat, processed or cured meats, and poultry with skin. Fill about one quarter of your plate with lean proteins such as fish, chicken without skin, beans, eggs, and tofu. ? Avoid premade and processed foods. These tend to be higher in sodium, added sugar, and fat.  Reduce your daily sodium intake. Most people with hypertension should eat less than 1,500 mg of sodium a day.  Limit alcohol intake to no more than 1 drink a day for nonpregnant women and 2 drinks a day for men. One drink equals 12 oz of beer, 5 oz of wine, or 1 oz of hard liquor. Lifestyle  Work with your health care provider to maintain a healthy body weight, or to lose weight. Ask what an ideal weight is for you.  Get at least 30 minutes of exercise that causes your heart to beat faster (aerobic exercise) most days of the week. Activities may include walking, swimming, or biking.  Include exercise   to strengthen your muscles (resistance exercise), such as weight lifting, as part of your weekly exercise routine. Try to do these types of exercises for 30 minutes at least 3 days a week.  Do not use any products that contain nicotine or tobacco, such as cigarettes and e-cigarettes. If you need help quitting,  ask your health care provider.  Control any long-term (chronic) conditions you have, such as high cholesterol or diabetes. Monitoring  Monitor your blood pressure at home as told by your health care provider. Your personal target blood pressure may vary depending on your medical conditions, your age, and other factors.  Have your blood pressure checked regularly, as often as told by your health care provider. Working with your health care provider  Review all the medicines you take with your health care provider because there may be side effects or interactions.  Talk with your health care provider about your diet, exercise habits, and other lifestyle factors that may be contributing to hypertension.  Visit your health care provider regularly. Your health care provider can help you create and adjust your plan for managing hypertension. Will I need medicine to control my blood pressure? Your health care provider may prescribe medicine if lifestyle changes are not enough to get your blood pressure under control, and if:  Your systolic blood pressure is 130 or higher.  Your diastolic blood pressure is 80 or higher. Take medicines only as told by your health care provider. Follow the directions carefully. Blood pressure medicines must be taken as prescribed. The medicine does not work as well when you skip doses. Skipping doses also puts you at risk for problems. Contact a health care provider if:  You think you are having a reaction to medicines you have taken.  You have repeated (recurrent) headaches.  You feel dizzy.  You have swelling in your ankles.  You have trouble with your vision. Get help right away if:  You develop a severe headache or confusion.  You have unusual weakness or numbness, or you feel faint.  You have severe pain in your chest or abdomen.  You vomit repeatedly.  You have trouble breathing. Summary  Hypertension is when the force of blood pumping  through your arteries is too strong. If this condition is not controlled, it may put you at risk for serious complications.  Your personal target blood pressure may vary depending on your medical conditions, your age, and other factors. For most people, a normal blood pressure is less than 120/80.  Hypertension is managed by lifestyle changes, medicines, or both. Lifestyle changes include weight loss, eating a healthy, low-sodium diet, exercising more, and limiting alcohol. This information is not intended to replace advice given to you by your health care provider. Make sure you discuss any questions you have with your health care provider. Document Released: 03/31/2012 Document Revised: 10/29/2018 Document Reviewed: 06/04/2016 Elsevier Patient Education  2020 [employer]  

## 2019-02-10 NOTE — Progress Notes (Signed)
l  Acute Office Visit  Subjective:    Patient ID: [name] [name], female    DOB: 1960/11/30, 58 y.o.   MRN: [id number]  Chief Complaint  Patient presents with  . Follow-up    HTN  . fasting labs    HPI Patient is in today for blood pressure re-evaluation but she has not even pick up her medication to determine effectiveness. Blood pressure is reevaluated discussed in details medication compliance and risk for a heart attack or stroke.  She remains depressed and anxious due to psychosocial issues. The CSW will re-evaluate her depression/anxiety.  Past Medical History:  Diagnosis Date  . Bloody stool   . Dental decay   . Gastritis, Helicobacter pylori 13/08/4399  . Hypertension   . Pneumonia 06/2015  . Prepyloric ulcer 06/22/2015  . Seasonal allergies   . Shortness of breath dyspnea    with exertion    Past Surgical History:  Procedure Laterality Date  . BARTHOLIN CYST MARSUPIALIZATION Left 1997  . CARDIAC SURGERY N/A 1963   Repair of PFO  . COLONOSCOPY N/A 2002   Performed secondary to mother's diagnosis of colon CA at age 21  . ESOPHAGOGASTRODUODENOSCOPY (EGD) WITH PROPOFOL N/A 06/22/2015   Procedure: ESOPHAGOGASTRODUODENOSCOPY (EGD) WITH PROPOFOL;  Surgeon: Arta Silence, MD;  Location: [facility] ENDOSCOPY;  Service: Endoscopy;  Laterality: N/A;  . FRACTURE SURGERY    . NASAL SINUS SURGERY Bilateral 1984  . OVARIAN CYST REMOVAL Left 1990  . TUBAL LIGATION    . UMBILICAL HERNIA REPAIR N/A 2005    Family History  Problem Relation Age of Onset  . Colon cancer Mother 71  . Hypertension Mother   . Hypertension Father   . Cerebral aneurysm Sister   . Goiter Sister     Social History   Socioeconomic History  . Marital status: Divorced    Spouse name: Not on file  . Number of children: 4  . Years of education: Not on file  . Highest education level: Not on file  Occupational History  . 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  Social Needs  . Financial resource strain: Not on file  . Food insecurity    Worry: Not on file    Inability: Not on file  . Transportation needs    Medical: Not on file    Non-medical: Not on file  Tobacco Use  . Smoking status: Never Smoker  . Smokeless tobacco: Never Used  Substance and Sexual Activity  . Alcohol use: Yes    Alcohol/week: 0.0 standard drinks    Comment: 48 oz malt liquor when drinks, but not daily.  Difficult to pin down; last drink was 06/17/15  . Drug use: No  . Sexual activity: Yes  Lifestyle  . Physical activity    Days per week: Not on file    Minutes per session: Not on file  . Stress: Not on file  Relationships  . Social Herbalist on phone: Not on file    Gets together: Not on file    Attends religious service: Not on file    Active member of club or organization: Not on file    Attends meetings of clubs or organizations: Not on file    Relationship status: Not on file  . Intimate partner violence    Fear of current or ex partner: Not on file    Emotionally abused: Not on file    Physically  abused: Not on file    Forced sexual activity: Not on file  Other Topics Concern  . Not on file  Social History Narrative   Born in [place], but grew up in [place]   Was homeless Sept. 2016, when initially established   Now has own apartment, lives alone   Brother and 2 sons live in town, improving relationships.   States ended up homeless due to difficulty with living situation when children moved to [place] to live with her--she ended up leaving to get away from them and did not have a job.     Not ready to discuss details    Outpatient Medications Prior to Visit  Medication Sig Dispense Refill  . FLUoxetine (PROZAC) 20 MG capsule Take 1 capsule (20 mg total) by mouth daily. (Patient not taking: Reported on 01/20/2019) 60 capsule 0  . hydrOXYzine (ATARAX/VISTARIL) 25 MG tablet Take 0.5 tablets (12.5 mg total)  by mouth at bedtime. (Patient not taking: Reported on 02/10/2019) 30 tablet 0  . lisinopril-hydrochlorothiazide (ZESTORETIC) 20-25 MG tablet Take 1 tablet by mouth daily. (Patient not taking: Reported on 02/10/2019) 90 tablet 3  . acetaminophen (TYLENOL) 325 MG tablet Take 650 mg by mouth every 6 (six) hours as needed for mild pain or moderate pain. Reported on 08/14/2015     No facility-administered medications prior to visit.     Allergies  Allergen Reactions  . Tetracyclines & Related Itching    Review of Systems  Constitutional: Positive for malaise/fatigue.  Respiratory: Positive for shortness of breath.   Psychiatric/Behavioral: The patient is nervous/anxious.        Objective:    Physical Exam  Constitutional: She is oriented to person, place, and time. She appears well-developed and well-nourished.  HENT:  Head: Normocephalic.  Neck: Neck supple.  Pulmonary/Chest: Effort normal and breath sounds normal.  Abdominal: Soft. Bowel sounds are normal. She exhibits distension.  Musculoskeletal:     Comments: Unstable gait uses a cane  Neurological: She is oriented to person, place, and time.  Skin: Skin is warm.  Psychiatric: She has a normal mood and affect.    BP (!) 153/97 (BP Location: Left Arm, Patient Position: Sitting, Cuff Size: Normal)   Pulse 90   Temp 97.6 F (36.4 C) (Tympanic)   Ht 5\' 3"  (1.6 m)   Wt 216 lb (98 kg)   SpO2 98%   BMI 38.26 kg/m  Wt Readings from Last 3 Encounters:  02/10/19 216 lb (98 kg)  07/30/18 215 lb 4.8 oz (97.7 kg)  04/15/18 211 lb 12.8 oz (96.1 kg)    Health Maintenance Due  Topic Date Due  . Hepatitis C Screening  Dec 03, 1960  . PAP SMEAR-Modifier  02/19/1982  . MAMMOGRAM  02/20/2011  . COLONOSCOPY  07/21/2011    There are no preventive care reminders to display for this patient.   Lab Results  Component Value Date   TSH 3.893 04/09/2011   Lab Results  Component Value Date   WBC 8.1 06/20/2015   HGB 11.1 06/20/2015    HCT 32.1 (L) 06/20/2015   MCV 90 06/20/2015   PLT 276 06/20/2015   Lab Results  Component Value Date   NA 138 10/06/2017   K 4.0 10/06/2017   CO2 19 (L) 10/06/2017   GLUCOSE 122 (H) 10/06/2017   BUN 9 10/06/2017   CREATININE 0.61 10/06/2017   BILITOT 0.4 06/20/2015   ALKPHOS 71 06/20/2015   AST 20 06/20/2015   ALT 19 06/20/2015   PROT  7.0 06/20/2015   ALBUMIN 4.3 06/20/2015   CALCIUM 8.9 10/06/2017   ANIONGAP 11 06/02/2015   Lab Results  Component Value Date   CHOL  03/15/2010    179        ATP III CLASSIFICATION:  <200     mg/dL   Desirable  200-239  mg/dL   Borderline High  >=240    mg/dL   High          Lab Results  Component Value Date   HDL 76 03/15/2010   Lab Results  Component Value Date   LDLCALC  03/15/2010    91        Total Cholesterol/HDL:CHD Risk Coronary Heart Disease Risk Table                     Men   Women  1/2 Average Risk   3.4   3.3  Average Risk       5.0   4.4  2 X Average Risk   9.6   7.1  3 X Average Risk  23.4   11.0        Use the calculated Patient Ratio above and the CHD Risk Table to determine the patient's CHD Risk.        ATP III CLASSIFICATION (LDL):  <100     mg/dL   Optimal  100-129  mg/dL   Near or Above                    Optimal  130-159  mg/dL   Borderline  160-189  mg/dL   High  >190     mg/dL   Very High   Lab Results  Component Value Date   TRIG 58 03/15/2010   Lab Results  Component Value Date   CHOLHDL 2.4 03/15/2010   Lab Results  Component Value Date   HGBA1C  03/15/2010    5.3 (NOTE)                                                                       According to the ADA Clinical Practice Recommendations for 2011, when HbA1c is used as a screening test:   >=6.5%   Diagnostic of Diabetes Mellitus           (if abnormal result  is confirmed)  5.7-6.4%   Increased risk of developing Diabetes Mellitus  References:Diagnosis and Classification of Diabetes Mellitus,Diabetes TOIZ,1245,80(DXIPJ  1):S62-S69 and Standards of Medical Care in         Diabetes - 2011,Diabetes Care,2011,34  (Suppl 1):S11-S61.       Assessment & Plan:   Problem List Items Addressed This Visit    None    Keionna was seen today for follow-up and fasting labs.  Diagnoses and all orders for this visit:  Essential hypertension Patient has not pick up her prescription for blood pressure medication . Discussed medication compliance and risk factor obesity, african american , elevated blood pressure - non compliance with uncontrolled stressors.  Anxiety with depression GAD 7 : Generalized Anxiety Score 02/10/2019 01/20/2019 01/20/2019 10/06/2017  Nervous, Anxious, on Edge 2 3 3 3   Control/stop worrying 3 3 3 1   Worry too much - different things 3  3 3 3   Trouble relaxing (No Data) 3 3 2   Restless (No Data) 3 3 1   Easily annoyed or irritable 3 3 3 2   Afraid - awful might happen 3 3 3 3   Total GAD 7 Score - 21 21 15   Anxiety Difficulty - - - Very difficult   Follow up with CSW at this visit.  No orders of the defined types were placed in this encounter.    Kerin Perna, NP

## 2019-02-11 LAB — CMP14+EGFR
ALT: 18 IU/L (ref 0–32)
AST: 22 IU/L (ref 0–40)
Albumin/Globulin Ratio: 1.6 (ref 1.2–2.2)
Albumin: 4.4 g/dL (ref 3.8–4.9)
Alkaline Phosphatase: 80 IU/L (ref 39–117)
BUN/Creatinine Ratio: 15 (ref 9–23)
BUN: 8 mg/dL (ref 6–24)
Bilirubin Total: 0.3 mg/dL (ref 0.0–1.2)
CO2: 22 mmol/L (ref 20–29)
Calcium: 9.2 mg/dL (ref 8.7–10.2)
Chloride: 100 mmol/L (ref 96–106)
Creatinine, Ser: 0.52 mg/dL — ABNORMAL LOW (ref 0.57–1.00)
GFR calc Af Amer: 123 mL/min/{1.73_m2} (ref >59)
GFR calc non Af Amer: 106 mL/min/{1.73_m2} (ref >59)
Globulin, Total: 2.8 g/dL (ref 1.5–4.5)
Glucose: 84 mg/dL (ref 65–99)
Potassium: 4.3 mmol/L (ref 3.5–5.2)
Sodium: 139 mmol/L (ref 134–144)
Total Protein: 7.2 g/dL (ref 6.0–8.5)

## 2019-02-11 LAB — CBC WITH DIFFERENTIAL/PLATELET
Basophils Absolute: 0 10*3/uL (ref 0.0–0.2)
Basos: 1 %
EOS (ABSOLUTE): 0.1 10*3/uL (ref 0.0–0.4)
Eos: 1 %
Hematocrit: 40 % (ref 34.0–46.6)
Hemoglobin: 13.4 g/dL (ref 11.1–15.9)
Immature Grans (Abs): 0 10*3/uL (ref 0.0–0.1)
Immature Granulocytes: 0 %
Lymphocytes Absolute: 2 10*3/uL (ref 0.7–3.1)
Lymphs: 34 %
MCH: 30.8 pg (ref 26.6–33.0)
MCHC: 33.5 g/dL (ref 31.5–35.7)
MCV: 92 fL (ref 79–97)
Monocytes Absolute: 0.6 10*3/uL (ref 0.1–0.9)
Monocytes: 10 %
Neutrophils Absolute: 3.2 10*3/uL (ref 1.4–7.0)
Neutrophils: 54 %
Platelets: 264 10*3/uL (ref 150–450)
RBC: 4.35 x10E6/uL (ref 3.77–5.28)
RDW: 12.5 % (ref 11.7–15.4)
WBC: 5.9 10*3/uL (ref 3.4–10.8)

## 2019-02-11 LAB — LIPID PANEL
Chol/HDL Ratio: 2.7 ratio (ref 0.0–4.4)
Cholesterol, Total: 180 mg/dL (ref 100–199)
HDL: 66 mg/dL (ref >39)
LDL Calculated: 93 mg/dL (ref 0–99)
Triglycerides: 105 mg/dL (ref 0–149)
VLDL Cholesterol Cal: 21 mg/dL (ref 5–40)

## 2019-02-11 LAB — HEMOGLOBIN A1C
Est. average glucose Bld gHb Est-mCnc: 105 mg/dL
Hgb A1c MFr Bld: 5.3 % (ref 4.8–5.6)

## 2019-02-11 NOTE — BH Specialist Note (Signed)
Integrated Behavioral Health Follow Up Visit  MRN: [id number] Name: [name] [name]  Number of [place] Clinician visits: 2/6 Session Start time: 10:00 AM  Session End time: 10:30 AM Total time: 30 minutes  Type of Service: [place] Interpretor:No. Interpretor Name and Language: N/A  SUBJECTIVE: [name] [name] is a 58 y.o. female accompanied by self Patient was referred by NP Oletta Lamas for depression and anxiety. Patient reports the following symptoms/concerns: Pt reports difficulty managing anxiety and depression symptoms, in addition, to chronic medical conditions.  Duration of problem: Ongoing; Severity of problem: severe  OBJECTIVE: Mood: Anxious and Affect: Appropriate Risk of harm to self or others: No plan to harm self or others  LIFE CONTEXT: Family and Social: Pt has stable housing and receives limited support from family School/Work: Pt has an active appeal for disability Self-Care: Pt is open to medication management. She uses alcohol to cope with stressors Life Changes: Pt reports difficulty managing anxiety and depression symptoms, in addition, to chronic medical conditions.   GOALS ADDRESSED: Patient will: 1.  Reduce symptoms of: anxiety and depression  2.  Increase knowledge and/or ability of: self-management skills  3.  Demonstrate ability to: Increase motivation to adhere to plan of care and Improve medication compliance  INTERVENTIONS: Interventions utilized:  Motivational Interviewing Standardized Assessments completed: GAD-7 and PHQ 2&9  ASSESSMENT: Patient currently experiencing anxiety and depression symptoms, in addition, to chronic medical conditions. She uses substances to cope with stress and is experiencing financial strain. Pt denies SI/HI. Depression scores have decreased since last visit with PCP.  Patient may benefit from psychotherapy. LCSW discussed the importance of medication compliance to manage  both physical and mental health. Pt verbalized understanding. LCSW also informed pt of "I" Statements to improve communication with pt's family.   PLAN: 1. Follow up with behavioral health clinician on : Pt was encouraged to contact LCSW if symptoms worsen or fail to improve to schedule behavioral appointments 2. Behavioral recommendations: LCSW recommends that pt apply healthy coping skills discussed. Pt is encouraged to schedule follow up appointment with LCSW 3. Referral(s): [place] (In Clinic) 4. "From scale of 1-10, how likely are you to follow plan?": 8  Rebekah Chesterfield, LCSW

## 2019-02-14 ENCOUNTER — Telehealth (INDEPENDENT_AMBULATORY_CARE_PROVIDER_SITE_OTHER): Payer: Self-pay

## 2019-02-14 NOTE — Telephone Encounter (Signed)
Patient is aware labs are normal and no changes to medications. Nat Christen, CMA

## 2019-02-14 NOTE — Telephone Encounter (Signed)
-----   Message from Kerin Perna, NP sent at 02/11/2019 12:06 PM EDT ----- All labs are normal no change in medication

## 2019-02-22 ENCOUNTER — Other Ambulatory Visit: Payer: Self-pay

## 2019-02-22 ENCOUNTER — Ambulatory Visit (INDEPENDENT_AMBULATORY_CARE_PROVIDER_SITE_OTHER): Payer: Self-pay | Admitting: Licensed Clinical Social Worker

## 2019-02-22 ENCOUNTER — Ambulatory Visit (INDEPENDENT_AMBULATORY_CARE_PROVIDER_SITE_OTHER): Payer: Self-pay | Admitting: Primary Care

## 2019-02-22 ENCOUNTER — Encounter (INDEPENDENT_AMBULATORY_CARE_PROVIDER_SITE_OTHER): Payer: Self-pay | Admitting: Primary Care

## 2019-02-22 VITALS — BP 159/101 | HR 87 | Temp 97.5°F | Ht 63.0 in | Wt 214.4 lb

## 2019-02-22 DIAGNOSIS — F1010 Alcohol abuse, uncomplicated: Secondary | ICD-10-CM

## 2019-02-22 DIAGNOSIS — F418 Other specified anxiety disorders: Secondary | ICD-10-CM

## 2019-02-22 DIAGNOSIS — F331 Major depressive disorder, recurrent, moderate: Secondary | ICD-10-CM

## 2019-02-22 DIAGNOSIS — I10 Essential (primary) hypertension: Secondary | ICD-10-CM

## 2019-02-22 DIAGNOSIS — Z1211 Encounter for screening for malignant neoplasm of colon: Secondary | ICD-10-CM

## 2019-02-22 DIAGNOSIS — F419 Anxiety disorder, unspecified: Secondary | ICD-10-CM

## 2019-02-22 MED ORDER — CLONIDINE HCL 0.1 MG PO TABS
0.2000 mg | ORAL_TABLET | Freq: Once | ORAL | Status: AC
  Administered 2019-02-22: 0.2 mg via ORAL

## 2019-02-22 MED ORDER — HYDROXYZINE HCL 25 MG PO TABS: 25.0000 mg | ORAL_TABLET | Freq: Every day | ORAL | 0 refills | Status: DC

## 2019-02-22 MED ORDER — FLUOXETINE HCL 20 MG PO CAPS: 20.0000 mg | ORAL_CAPSULE | Freq: Every day | ORAL | 0 refills | Status: DC

## 2019-02-22 MED ORDER — AMLODIPINE BESYLATE 10 MG PO TABS: 10.0000 mg | ORAL_TABLET | Freq: Every day | ORAL | 3 refills | Status: DC
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
## 2019-02-22 NOTE — Patient Instructions (Signed)

## 2019-02-24 NOTE — BH Specialist Note (Signed)
Integrated Behavioral Health Follow Up Visit  MRN: [id number] Name: [name] [name]  Number of [place] Clinician visits: 3/6 Session Start time: 11:00 AM  Session End time: 11:20 AM Total time: 20 minutes  Type of Service: [place] Interpretor:No. Interpretor Name and Language: Na  SUBJECTIVE: [name] [name] is a 58 y.o. female accompanied by self Patient was referred by NP Edwards for depression, anxiety, and substance use. Patient reports the following symptoms/concerns: Pt reports decrease in symptoms since last visit. She reported that she was not able to complete exercises LCSW suggested at previous session; however, she was able to pick up medications Duration of problem: Ongoing; Severity of problem: moderate  OBJECTIVE: Mood: Pleasant and Affect: Appropriate Risk of harm to self or others: No plan to harm self or others  LIFE CONTEXT: Family and Social: Pt receives support from family. States that their communication has improved slightly School/Work: Pt has pending disability claim Self-Care: Pt is participating in medication management. LCSW reiterated importance of medication compliance Life Changes: Pt has difficulty managing mental and physical health due to psychosocial stressors  GOALS ADDRESSED: Patient will: 1.  Reduce symptoms of: anxiety and depression  2.  Increase knowledge and/or ability of: self-management skills  3.  Demonstrate ability to: Increase motivation to adhere to plan of care, Improve medication compliance and Decrease self-medicating behaviors  INTERVENTIONS: Interventions utilized:  Supportive Counseling Standardized Assessments completed: GAD-7 and PHQ 2&9  ASSESSMENT: Patient currently experiencing depression and anxiety triggered by psychosocial stressors. Pt reports a decrease in symptoms. She was able to obtain medications since previous session. LCSW re-iterated importance of medication  compliance and reduction of substance use.    Patient may benefit from psychotherapy. Pt agreed to follow up with [facility] to initiate psychotherapy and medication management.  PLAN: 1. Follow up with behavioral health clinician on : Pt was encouraged to contact LCSW if symptoms worsen or fail to improve to schedule behavioral appointments 2. Behavioral recommendations: LCSW recommends that pt apply healthy coping skills discussed and comply with medication management. Pt is encouraged to schedule appointment with [facility] [place] 3. Referral(s): [place] (In Clinic) 4. "From scale of 1-10, how likely are you to follow plan?": [address]  Rebekah Chesterfield, LCSW 02/25/2019 10:33 AM

## 2019-03-07 ENCOUNTER — Other Ambulatory Visit (INDEPENDENT_AMBULATORY_CARE_PROVIDER_SITE_OTHER): Payer: Self-pay

## 2019-03-07 DIAGNOSIS — Z1211 Encounter for screening for malignant neoplasm of colon: Secondary | ICD-10-CM

## 2019-03-08 ENCOUNTER — Ambulatory Visit (INDEPENDENT_AMBULATORY_CARE_PROVIDER_SITE_OTHER): Payer: Self-pay | Admitting: Primary Care

## 2019-03-09 LAB — FECAL OCCULT BLOOD, IMMUNOCHEMICAL: Fecal Occult Bld: NEGATIVE

## 2019-03-16 ENCOUNTER — Encounter (INDEPENDENT_AMBULATORY_CARE_PROVIDER_SITE_OTHER): Payer: Self-pay

## 2019-03-25 ENCOUNTER — Telehealth (INDEPENDENT_AMBULATORY_CARE_PROVIDER_SITE_OTHER): Payer: Self-pay | Admitting: Primary Care
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
## 2019-04-07 ENCOUNTER — Ambulatory Visit (HOSPITAL_COMMUNITY): Payer: Self-pay

## 2019-07-20 ENCOUNTER — Telehealth (INDEPENDENT_AMBULATORY_CARE_PROVIDER_SITE_OTHER): Payer: Self-pay | Admitting: Primary Care

## 2019-07-20 ENCOUNTER — Other Ambulatory Visit (INDEPENDENT_AMBULATORY_CARE_PROVIDER_SITE_OTHER): Payer: Self-pay | Admitting: Primary Care

## 2019-07-20 MED ORDER — GUAIFENESIN ER 600 MG PO TB12: 600.0000 mg | ORAL_TABLET | Freq: Two times a day (BID) | ORAL | 1 refills | Status: DC

## 2019-07-20 NOTE — Telephone Encounter (Signed)
Pt has a cough and would like something sent to her pharmacy if possible. [facility] ([place]), [place] - [address]  If not she has an appointment scheduled for  07/24/2018 to discuss the cough and questions about inhalers

## 2019-07-20 NOTE — Telephone Encounter (Signed)
Sent in guaifenesin

## 2019-07-25 ENCOUNTER — Ambulatory Visit (INDEPENDENT_AMBULATORY_CARE_PROVIDER_SITE_OTHER): Payer: Self-pay | Admitting: Primary Care

## 2019-09-01 ENCOUNTER — Other Ambulatory Visit (INDEPENDENT_AMBULATORY_CARE_PROVIDER_SITE_OTHER): Payer: Self-pay | Admitting: Primary Care

## 2019-09-01 MED ORDER — BLOOD PRESSURE MONITOR KIT: 1.0000 | PACK | Freq: Three times a day (TID) | 0 refills | Status: DC | PRN

## 2019-10-13 ENCOUNTER — Ambulatory Visit (INDEPENDENT_AMBULATORY_CARE_PROVIDER_SITE_OTHER): Payer: Medicare HMO | Admitting: Primary Care

## 2019-10-13 ENCOUNTER — Encounter (INDEPENDENT_AMBULATORY_CARE_PROVIDER_SITE_OTHER): Payer: Self-pay | Admitting: Primary Care

## 2019-10-13 ENCOUNTER — Other Ambulatory Visit: Payer: Self-pay

## 2019-10-13 DIAGNOSIS — J302 Other seasonal allergic rhinitis: Secondary | ICD-10-CM

## 2019-10-13 DIAGNOSIS — F5101 Primary insomnia: Secondary | ICD-10-CM | POA: Diagnosis not present

## 2019-10-13 DIAGNOSIS — I10 Essential (primary) hypertension: Secondary | ICD-10-CM

## 2019-10-13 MED ORDER — HYDROXYZINE HCL 25 MG PO TABS: 25.0000 mg | ORAL_TABLET | Freq: Every day | ORAL | 0 refills | Status: DC

## 2019-10-13 MED ORDER — LORATADINE 10 MG PO TABS: 10.0000 mg | ORAL_TABLET | Freq: Every day | ORAL | 11 refills | Status: DC

## 2019-10-13 MED ORDER — MUCINEX 600 MG PO TB12: 600.0000 mg | ORAL_TABLET | Freq: Two times a day (BID) | ORAL | 1 refills | Status: DC

## 2019-10-13 MED ORDER — ALBUTEROL SULFATE HFA 108 (90 BASE) MCG/ACT IN AERS: 2.0000 | INHALATION_SPRAY | Freq: Four times a day (QID) | RESPIRATORY_TRACT | 1 refills | Status: DC | PRN

## 2019-10-13 NOTE — Progress Notes (Signed)
Bp reading this am 140/60  Pt advised to check Bp daily- she was checking Three times a week  Pt would like Rx for inhaler

## 2019-10-13 NOTE — Progress Notes (Signed)
fVirtual Visit via Telephone Note  I connected with [name] [name] [name] on 10/13/19 at  4:10 PM EDT by telephone and verified that I am speaking with the correct [name] [name] using two identifiers.   I discussed the limitations, risks, security and privacy concerns of performing an evaluation and management service by telephone and the availability of in [name] [name] appointments. I also discussed with the patient that there may be a patient responsible charge related to this service. The patient expressed understanding and agreed to proceed.   History of Present Illness: Ms. [name] [name] [name] a 59 year old African-American female is having a tele visit for follow-up blood pressure, she takes it sporadically states it ranges 140/60.  We discussed taking her blood pressure before her medication and then 30 minutes to 1 hour at 3 times a day the third consecutive 2 weeks for a accurate.  She is also concerned with cough congestion, chest tightness with shortness of breath. Past Medical History:  Diagnosis Date  . Bloody stool   . Dental decay   . Gastritis, Helicobacter pylori 60/12/3014  . Hypertension   . Pneumonia 06/2015  . Prepyloric ulcer 06/22/2015  . Seasonal allergies   . Shortness of breath dyspnea    with exertion   Current Outpatient Medications on File Prior to Visit  Medication Sig Dispense Refill  . amLODipine (NORVASC) 10 MG tablet Take 1 tablet (10 mg total) by mouth daily. 90 tablet 3  . Blood Pressure Monitor KIT 1 kit by Does not apply route 3 (three) times daily as needed. 1 kit 0  . lisinopril-hydrochlorothiazide (ZESTORETIC) 20-25 MG tablet Take 1 tablet by mouth daily. 90 tablet 3   No current facility-administered medications on file prior to visit.     Observations/Objective: Review of Systems  Respiratory: Positive for cough and shortness of breath.   Endo/Heme/Allergies: Positive for environmental allergies.  Psychiatric/Behavioral: The patient has insomnia.   All other systems  reviewed and are negative.  Assessment and Plan: [name] [name] was seen today for hypertension.  Diagnoses and all orders for this visit:  Essential hypertension Continue all antihypertensives as prescribed.  Remember to bring in your blood pressure log with you for your follow up appointment.  DASH/Mediterranean Diets are healthier choices for HTN.   Primary insomnia Insomnia Insomnia is frequent trouble falling and/or staying asleep. Insomnia can be a long term problem or a short term problem. Both are common. Insomnia can be a short term problem when the wakefulness is related to a certain stress or worry. Long term insomnia is often related to ongoing stress during waking hours and/or poor sleeping habits. Overtime, sleep deprivation itself can make the problem worse. Every little thing feels more severe because you are overtired and your ability to cope is decreased. -     hydrOXYzine (ATARAX/VISTARIL) 25 MG tablet; Take 1 tablet (25 mg total) by mouth at bedtime.  Seasonal allergies Sent in prescription for Claritin 10m daily and guaifenesin.  Also for bronchospasms prescribed albuterol 1-2 puffs every 6 hours as needed - Drink at least 64 ounces of water each day. - If you have a humidifier use it nightly. - Remove as many irritants/allergies as you are able to, no pets in the bedroom, change air filters in air vents.   Other orders -     guaiFENesin (MUCINEX) 600 MG 12 hr tablet; Take 1 tablet (600 mg total) by mouth 2 (two) times daily. -     albuterol (VENTOLIN HFA) 108 (90 Base) MCG/ACT inhaler; Inhale  2 puffs into the lungs every 6 (six) hours as needed for wheezing or shortness of breath. -     loratadine (CLARITIN) 10 MG tablet; Take 1 tablet (10 mg total) by mouth daily.    Follow Up Instructions:    I discussed the assessment and treatment plan with the patient. The patient was provided an opportunity to ask questions and all were answered. The patient agreed with the plan  and demonstrated an understanding of the instructions.   The patient was advised to call back or seek an in-[name] [name] evaluation if the symptoms worsen or if the condition fails to improve as anticipated.  I provided 14 minutes of non-face-to-face time during this encounter.   Kerin Perna, NP

## 2019-10-18 ENCOUNTER — Other Ambulatory Visit (INDEPENDENT_AMBULATORY_CARE_PROVIDER_SITE_OTHER): Payer: Self-pay | Admitting: Primary Care

## 2019-10-18 ENCOUNTER — Telehealth (INDEPENDENT_AMBULATORY_CARE_PROVIDER_SITE_OTHER): Payer: Self-pay

## 2019-10-18 MED ORDER — CHLORTHALIDONE 50 MG PO TABS: 50.0000 mg | ORAL_TABLET | Freq: Every day | ORAL | 1 refills | Status: DC

## 2019-10-18 NOTE — Telephone Encounter (Signed)
Side affect of ACE is cough discontinue lisinopril/hctz added Chlorothion 50mg  continue amlodipine 10mg  daily. Schedule bp follow up in person 3-4 weeks

## 2019-10-18 NOTE — Telephone Encounter (Signed)
Patient called to inform that she is having a controllable cough and a little bit of blurred vision. Patient states that she was reading all the side effects of all her medication and states that these are the side affects of lisinopril-hydrochlorothiazide (ZESTORETIC) 20-25 MG tablet  Patient would like a call back from PCP or would like to know if PCP can change the medication to another brand.  Please advice [phone]  Thank you

## 2019-10-18 NOTE — Telephone Encounter (Signed)
Sent to PCP for advising.  

## 2019-10-19 MED ORDER — CHLORTHALIDONE 50 MG PO TABS: 50.0000 mg | ORAL_TABLET | Freq: Every day | ORAL | 1 refills | Status: DC

## 2019-10-19 NOTE — Telephone Encounter (Signed)
Patient is aware to stop taking lisinopril/hctz as the cough is a side effect. PCP added chlorthalidone 50 mg to take in addition to amlodipine. Patient is going to call back to schedule in person Bp check once she gets covid results. She is going to be tested today since she is not feeling well. Patient asked that Rx for chlorthalidone be sent to [facility] pharmacy, she has no way to pick up medications from [facility]. Rx sent to patients pharmacy of choice. Nat Christen, CMA

## 2019-10-28 ENCOUNTER — Emergency Department (HOSPITAL_COMMUNITY)
Admission: EM | Admit: 2019-10-28 | Discharge: 2019-10-28 | Disposition: A | Discharge status: Home / Self Care | Payer: Medicare HMO | Attending: Emergency Medicine | Admitting: Emergency Medicine

## 2019-10-28 ENCOUNTER — Encounter (HOSPITAL_COMMUNITY): Payer: Self-pay

## 2019-10-28 ENCOUNTER — Other Ambulatory Visit: Payer: Self-pay

## 2019-10-28 DIAGNOSIS — R519 Headache, unspecified: Secondary | ICD-10-CM | POA: Diagnosis not present

## 2019-10-28 DIAGNOSIS — Z79899 Other long term (current) drug therapy: Secondary | ICD-10-CM | POA: Insufficient documentation

## 2019-10-28 DIAGNOSIS — R1012 Left upper quadrant pain: Secondary | ICD-10-CM | POA: Diagnosis not present

## 2019-10-28 DIAGNOSIS — R42 Dizziness and giddiness: Secondary | ICD-10-CM | POA: Diagnosis not present

## 2019-10-28 DIAGNOSIS — R1013 Epigastric pain: Secondary | ICD-10-CM | POA: Diagnosis not present

## 2019-10-28 DIAGNOSIS — I10 Essential (primary) hypertension: Secondary | ICD-10-CM | POA: Diagnosis not present

## 2019-10-28 LAB — CBC
HCT: 39.5 % (ref 36.0–46.0)
Hemoglobin: 12.8 g/dL (ref 12.0–15.0)
MCH: 31.4 pg (ref 26.0–34.0)
MCHC: 32.4 g/dL (ref 30.0–36.0)
MCV: 97.1 fL (ref 80.0–100.0)
Platelets: 237 10*3/uL (ref 150–400)
RBC: 4.07 MIL/uL (ref 3.87–5.11)
RDW: 13.1 % (ref 11.5–15.5)
WBC: 7.8 10*3/uL (ref 4.0–10.5)
nRBC: 0 % (ref 0.0–0.2)

## 2019-10-28 LAB — BASIC METABOLIC PANEL
Anion gap: 12 (ref 5–15)
BUN: 8 mg/dL (ref 6–20)
CO2: 28 mmol/L (ref 22–32)
Calcium: 9.2 mg/dL (ref 8.9–10.3)
Chloride: 98 mmol/L (ref 98–111)
Creatinine, Ser: 0.69 mg/dL (ref 0.44–1.00)
GFR calc Af Amer: >60 mL/min (ref >60)
GFR calc non Af Amer: >60 mL/min (ref >60)
Glucose, Bld: 105 mg/dL — ABNORMAL HIGH (ref 70–99)
Potassium: 3.2 mmol/L — ABNORMAL LOW (ref 3.5–5.1)
Sodium: 138 mmol/L (ref 135–145)

## 2019-10-28 LAB — URINALYSIS, ROUTINE W REFLEX MICROSCOPIC
Bilirubin Urine: NEGATIVE
Glucose, UA: NEGATIVE mg/dL
Hgb urine dipstick: NEGATIVE
Ketones, ur: NEGATIVE mg/dL
Leukocytes,Ua: NEGATIVE
Nitrite: NEGATIVE
Protein, ur: NEGATIVE mg/dL
Specific Gravity, Urine: 1.009 (ref 1.005–1.030)
pH: 7 (ref 5.0–8.0)

## 2019-10-28 LAB — HEPATIC FUNCTION PANEL
ALT: 13 U/L (ref 0–44)
AST: 18 U/L (ref 15–41)
Albumin: 3.8 g/dL (ref 3.5–5.0)
Alkaline Phosphatase: 74 U/L (ref 38–126)
Bilirubin, Direct: 0.1 mg/dL (ref 0.0–0.2)
Indirect Bilirubin: 0.5 mg/dL (ref 0.3–0.9)
Total Bilirubin: 0.6 mg/dL (ref 0.3–1.2)
Total Protein: 7.2 g/dL (ref 6.5–8.1)

## 2019-10-28 LAB — LIPASE, BLOOD: Lipase: 16 U/L (ref 11–51)

## 2019-10-28 LAB — MAGNESIUM: Magnesium: 1.7 mg/dL (ref 1.7–2.4)

## 2019-10-28 MED ORDER — ALUM & MAG HYDROXIDE-SIMETH 200-200-20 MG/5ML PO SUSP
15.0000 mL | Freq: Once | ORAL | Status: DC
  Filled 2019-10-28: qty 30

## 2019-10-28 MED ORDER — ONDANSETRON HCL 4 MG/2ML IJ SOLN
4.0000 mg | Freq: Once | INTRAMUSCULAR | Status: AC
  Administered 2019-10-28: 4 mg via INTRAVENOUS
  Filled 2019-10-28: qty 2

## 2019-10-28 MED ORDER — POTASSIUM CHLORIDE CRYS ER 20 MEQ PO TBCR
40.0000 meq | EXTENDED_RELEASE_TABLET | Freq: Once | ORAL | Status: AC
  Administered 2019-10-28: 40 meq via ORAL
  Filled 2019-10-28: qty 2

## 2019-10-28 MED ORDER — OMEPRAZOLE 20 MG PO CPDR: 20.0000 mg | DELAYED_RELEASE_CAPSULE | Freq: Every day | ORAL | 0 refills | Status: DC

## 2019-10-28 MED ORDER — ACETAMINOPHEN 500 MG PO TABS
1000.0000 mg | ORAL_TABLET | Freq: Once | ORAL | Status: AC
  Administered 2019-10-28: 1000 mg via ORAL
  Filled 2019-10-28: qty 2

## 2019-10-28 MED ORDER — LACTATED RINGERS IV BOLUS
1000.0000 mL | Freq: Once | INTRAVENOUS | Status: AC
  Administered 2019-10-28: 1000 mL via INTRAVENOUS

## 2019-10-28 NOTE — ED Triage Notes (Signed)
Pt reports dizziness this week,pt had her Bp medications changed on Monday. Pt a.o, nad noted in triage. Pt also reports abd pain since coughing too hard, had hernia surgery in 2004.

## 2019-10-28 NOTE — ED Notes (Signed)
Lab called and Lipase and Hepatic function added on.

## 2019-10-28 NOTE — ED Notes (Signed)
EDP at bedside  

## 2019-10-28 NOTE — ED Provider Notes (Signed)
[facility] EMERGENCY DEPARTMENT Provider Note   CSN: [id number] Arrival date & time: 10/28/19  1124     History Chief Complaint  Patient presents with  . Dizziness    [name] [name] is a 59 y.o. female.  HPI   59 year old female with a past medical history of HTN, previous gastritis secondary to Helicobacter pylori and prepyloric ulcer presenting to the emergency department complaining of intermittent lightheadedness the past week.  Patient recently had blood pressure medications changed 4 days ago.  Patient was primarily on a lisinopril which she felt she was no longer tolerating so her primary physician changed her to chlorthalidone 50 mg.  Patient is continued to experience lightheadedness/presyncope when she stands up.  Patient reports she has had some decreased p.o. intake often missing meals due to being busy throughout the day and drinking less.  She feels that she may be dehydrated.  Patient also notes having associated epigastric achy abdominal pain that is worsened with coughing.  He noted having a umbilical hernia repair in 2004 no other intra-abdominal surgeries.  Patient states that she has pain over the area where she had her previous hernia and states that it is worse than prior to her hernia.  The patient notes having some nausea but no vomiting and no changes in bowel or bladder function.  The patient notes that the aggravating factors for her abdominal pain are palpation and coughing.  No specific alleviating factors.  Patient denies any fevers, chills, chest pain, shortness of breath, palpitations, lower extremity swelling, recent long trips or surgeries, hormone therapy, history of cancer, history of previous blood clots.  Patient does note having some dyspnea on exertion when going up stairs but this is rare.  Patient notes that she does have a sporadic cough after recovering from an upper respiratory infection and she states that it is significantly improved.   Patient denies any room spinning, vision changes.  She notes having a mild generalized headache.  Past Medical History:  Diagnosis Date  . Bloody stool   . Dental decay   . Gastritis, Helicobacter pylori 27/0/6237  . Hypertension   . Pneumonia 06/2015  . Prepyloric ulcer 06/22/2015  . Seasonal allergies   . Shortness of breath dyspnea    with exertion    Patient Active Problem List   Diagnosis Date Noted  . Anxiety with depression 10/06/2017  . Healthcare maintenance 10/06/2017  . Arthritis of left knee 10/06/2017  . Alcohol use disorder 07/06/2015  . Prepyloric ulcer 06/22/2015  . Essential hypertension 04/14/2015  . Seasonal allergies 04/14/2015    Past Surgical History:  Procedure Laterality Date  . BARTHOLIN CYST MARSUPIALIZATION Left 1997  . CARDIAC SURGERY N/A 1963   Repair of PFO  . COLONOSCOPY N/A 2002   Performed secondary to mother's diagnosis of colon CA at age 4  . ESOPHAGOGASTRODUODENOSCOPY (EGD) WITH PROPOFOL N/A 06/22/2015   Procedure: ESOPHAGOGASTRODUODENOSCOPY (EGD) WITH PROPOFOL;  Surgeon: Arta Silence, MD;  Location: [facility] ENDOSCOPY;  Service: Endoscopy;  Laterality: N/A;  . FRACTURE SURGERY    . NASAL SINUS SURGERY Bilateral 1984  . OVARIAN CYST REMOVAL Left 1990  . TUBAL LIGATION    . UMBILICAL HERNIA REPAIR N/A 2005     OB History   No obstetric history on file.     Family History  Problem Relation Age of Onset  . Colon cancer Mother 89  . Hypertension Mother   . Hypertension Father   . Cerebral aneurysm Sister   .  Goiter Sister     Social History   Tobacco Use  . Smoking status: Never Smoker  . Smokeless tobacco: Never Used  Substance Use Topics  . Alcohol use: Yes    Alcohol/week: 0.0 standard drinks    Comment: 48 oz malt liquor when drinks, but not daily.  Difficult to pin down; last drink was 06/17/15  . Drug use: No    Home Medications Prior to Admission medications   Medication Sig Start Date End Date Taking? Authorizing  Provider  albuterol (VENTOLIN HFA) 108 (90 Base) MCG/ACT inhaler Inhale 2 puffs into the lungs every 6 (six) hours as needed for wheezing or shortness of breath. 10/13/19   Kerin Perna, NP  amLODipine (NORVASC) 10 MG tablet Take 1 tablet (10 mg total) by mouth daily. 02/22/19   Kerin Perna, NP  Blood Pressure Monitor KIT 1 kit by Does not apply route 3 (three) times daily as needed. 09/01/19   Kerin Perna, NP  chlorthalidone (HYGROTON) 50 MG tablet Take 1 tablet (50 mg total) by mouth daily. 10/19/19   Kerin Perna, NP  guaiFENesin (MUCINEX) 600 MG 12 hr tablet Take 1 tablet (600 mg total) by mouth 2 (two) times daily. 10/13/19   Kerin Perna, NP  hydrOXYzine (ATARAX/VISTARIL) 25 MG tablet Take 1 tablet (25 mg total) by mouth at bedtime. 10/13/19   Kerin Perna, NP  loratadine (CLARITIN) 10 MG tablet Take 1 tablet (10 mg total) by mouth daily. 10/13/19   Kerin Perna, NP  omeprazole (PRILOSEC) 20 MG capsule Take 1 capsule (20 mg total) by mouth daily. 10/28/19 11/27/19  Filbert Berthold, MD    Allergies    Tetracyclines & related  Review of Systems   Review of Systems  Constitutional: Positive for activity change and appetite change. Negative for chills, diaphoresis, fatigue and fever.  HENT: Negative for congestion and rhinorrhea.   Respiratory: Positive for cough. Negative for shortness of breath and wheezing.   Cardiovascular: Negative for chest pain.  Gastrointestinal: Positive for abdominal pain and nausea. Negative for abdominal distention, diarrhea and vomiting.  Genitourinary: Negative for decreased urine volume, difficulty urinating, dysuria, frequency and urgency.  Musculoskeletal: Negative for gait problem.  Skin: Negative for color change and wound.  Neurological: Positive for light-headedness and headaches. Negative for dizziness, syncope, facial asymmetry, weakness and numbness.  All other systems reviewed and are negative.   Physical  Exam Updated Vital Signs BP (!) 149/79   Pulse 92   Temp 98 F (36.7 C) (Oral)   Resp 20   SpO2 99%   Physical Exam Vitals and nursing note reviewed.  Constitutional:      General: She is not in acute distress.    Appearance: Normal appearance. She is obese. She is not ill-appearing or toxic-appearing.  HENT:     Head: Normocephalic.     Right Ear: External ear normal.     Left Ear: External ear normal.     Nose: Nose normal.     Mouth/Throat:     Mouth: Mucous membranes are moist.     Pharynx: Oropharynx is clear.  Eyes:     Extraocular Movements: Extraocular movements intact.     Comments: No conjunctival pallor  Cardiovascular:     Rate and Rhythm: Normal rate and regular rhythm.     Pulses: Normal pulses.     Heart sounds: Normal heart sounds.  Pulmonary:     Effort: Pulmonary effort is normal. No respiratory distress.  Breath sounds: Normal breath sounds. No wheezing or rhonchi.  Abdominal:     General: Bowel sounds are normal. There is no distension.     Palpations: Abdomen is soft.     Tenderness: There is abdominal tenderness in the epigastric area and left upper quadrant. There is no guarding or rebound.  Musculoskeletal:     Cervical back: Normal range of motion.     Right lower leg: No edema.     Left lower leg: No edema.  Skin:    General: Skin is warm and dry.     Capillary Refill: Capillary refill takes less than 2 seconds.  Neurological:     General: No focal deficit present.     Mental Status: She is alert and oriented to person, place, and time. Mental status is at baseline.  Psychiatric:        Mood and Affect: Mood normal.     ED Results / Procedures / Treatments   Labs (all labs ordered are listed, but only abnormal results are displayed) Labs Reviewed  BASIC METABOLIC PANEL - Abnormal; Notable for the following components:      Result Value   Potassium 3.2 (*)    Glucose, Bld 105 (*)    All other components within normal limits  CBC    URINALYSIS, ROUTINE W REFLEX MICROSCOPIC  MAGNESIUM  LIPASE, BLOOD  HEPATIC FUNCTION PANEL    EKG EKG Interpretation  Date/Time:  Friday October 28 2019 12:15:04 EDT Ventricular Rate:  101 PR Interval:  140 QRS Duration: 98 QT Interval:  368 QTC Calculation: 477 R Axis:   92 Text Interpretation: Sinus tachycardia Rightward axis Possible Anterior infarct , age undetermined ST & T wave abnormality, consider inferior ischemia Abnormal ECG ST/T changes similar to Nov 2016 Confirmed by Sherwood Gambler [phone]) on 10/28/2019 4:05:32 PM   Radiology No results found.  Procedures Procedures (including critical care time)  Medications Ordered in ED Medications  alum & mag hydroxide-simeth (MAALOX/MYLANTA) 200-200-20 MG/5ML suspension 15 mL (15 mLs Oral Refused 10/28/19 1827)  lactated ringers bolus 1,000 mL (1,000 mLs Intravenous New Bag/Given 10/28/19 1827)  ondansetron (ZOFRAN) injection 4 mg (4 mg Intravenous Given 10/28/19 1825)  acetaminophen (TYLENOL) tablet 1,000 mg (1,000 mg Oral Given 10/28/19 1824)  potassium chloride SA (KLOR-CON) CR tablet 40 mEq (40 mEq Oral Given 10/28/19 1825)    ED Course  I have reviewed the triage vital signs and the nursing notes.  Pertinent labs & imaging results that were available during my care of the patient were reviewed by me and considered in my medical decision making (see chart for details).    MDM Rules/Calculators/A&P                      59 year old female with a past medical history of HTN, previous gastritis secondary to Helicobacter pylori and prepyloric ulcer presenting to the emergency department complaining of intermittent lightheadedness the past week.    Differential diagnoses considered include dehydration/hypovolemia, less likely dysrhythmia or ACS, low suspicion for PE based off the patient's presentation and risk factors, possible the patient could have anemia secondary to PUD, patient could have some medication side effects from  recently starting a new antihypertensive though her lightheadedness was persistent prior to the medication change.  I have a low suspicion for PTX, PE, PNA, coronary artery dissection, aortic dissection, CVA or other central cause of lightheadedness/dizziness.  Patient's description of her symptoms is more consistent with lightheadedness rather than dizziness.  I have a low suspicion for acute complication related to the patient's previous umbilical hernia repair as the patient has a overall benign appearing abdominal exam without any peritoneal signs and no associated nausea or vomiting or other signs to suggest such.  Initial interventions Tylenol 1 g p.o., Maalox, Zofran 4 mg, 1 L IV LR  ECG interpreted by me demonstrated Sinus tachycardia at 101 bpm with right axis deviation, LPFB, possible remote anterior infarct, ST elevations in V1 through V3, nonspecific ST-T wave abnormalities in the inferior leads, unchanged compared to previous on 06/02/2015  Labs demonstrated no severe derangements on CBC, mild hypokalemia to 3.2 will replete p.o., otherwise no sign derangement on BMP, urinalysis unremarkable, magnesium of 1.7, negative lipase, no significant arrangement on hepatic function panel  Upon reassessment patient is feeling improved.  I suspect the patient's symptoms were most likely related to potential gastritis complicated by some poor p.o. intake and hypovolemia.  I encouraged the patient to follow-up closely with her primary care physician for ongoing chronic management of likely gastritis and for reevaluation of her symptoms to ensure that they have not persisted or worsened.  Will provide the patient with prescription for omeprazole.  The patient is safe and stable for discharge at this time with return precautions provided and a plan for follow up care in place as needed   The plan for this patient was discussed with Dr. Regenia Skeeter, who voiced agreement and who oversaw evaluation and  treatment of this patient.  Final Clinical Impression(s) / ED Diagnoses Final diagnoses:  Lightheadedness    Rx / DC Orders ED Discharge Orders         Ordered    omeprazole (PRILOSEC) 20 MG capsule  Daily     10/28/19 1847           Filbert Berthold, MD 10/28/19 Dagoberto Reef, MD 10/31/19 1718

## 2019-10-28 NOTE — ED Notes (Signed)
Patient verbalizes understanding of discharge instructions. Opportunity for questioning and answers were provided. Armband removed by staff, pt discharged from ED ambulatory to home.  

## 2019-11-14 ENCOUNTER — Encounter (HOSPITAL_COMMUNITY): Payer: Self-pay

## 2019-11-14 ENCOUNTER — Other Ambulatory Visit: Payer: Self-pay

## 2019-11-14 ENCOUNTER — Emergency Department (HOSPITAL_COMMUNITY)
Admission: EM | Admit: 2019-11-14 | Discharge: 2019-11-14 | Disposition: A | Discharge status: Home / Self Care | Payer: Medicare HMO | Attending: Emergency Medicine | Admitting: Emergency Medicine

## 2019-11-14 DIAGNOSIS — Z5321 Procedure and treatment not carried out due to patient leaving prior to being seen by health care provider: Secondary | ICD-10-CM | POA: Diagnosis not present

## 2019-11-14 DIAGNOSIS — K5900 Constipation, unspecified: Secondary | ICD-10-CM | POA: Insufficient documentation

## 2019-11-14 DIAGNOSIS — K625 Hemorrhage of anus and rectum: Secondary | ICD-10-CM | POA: Diagnosis not present

## 2019-11-14 DIAGNOSIS — R58 Hemorrhage, not elsewhere classified: Secondary | ICD-10-CM | POA: Diagnosis not present

## 2019-11-14 DIAGNOSIS — I10 Essential (primary) hypertension: Secondary | ICD-10-CM | POA: Diagnosis not present

## 2019-11-14 NOTE — ED Notes (Signed)
Pt is in the triage restroom trying to move her bowels without success

## 2019-11-14 NOTE — ED Triage Notes (Signed)
Pt reports that she hasn't had a BM in 6 days. She tried to disimpact herself with a bobby pen and started bleeding bright red blood shortly after. A&Ox4. Ambulatory.

## 2019-11-16 DIAGNOSIS — Z9181 History of falling: Secondary | ICD-10-CM | POA: Diagnosis not present

## 2019-11-22 DIAGNOSIS — Z03818 Encounter for observation for suspected exposure to other biological agents ruled out: Secondary | ICD-10-CM | POA: Diagnosis not present

## 2019-11-22 DIAGNOSIS — Z20822 Contact with and (suspected) exposure to covid-19: Secondary | ICD-10-CM | POA: Diagnosis not present

## 2019-11-29 ENCOUNTER — Other Ambulatory Visit: Payer: Self-pay

## 2019-11-29 ENCOUNTER — Telehealth (INDEPENDENT_AMBULATORY_CARE_PROVIDER_SITE_OTHER): Payer: Medicare HMO | Admitting: Primary Care

## 2019-12-02 DIAGNOSIS — I10 Essential (primary) hypertension: Secondary | ICD-10-CM | POA: Diagnosis not present

## 2019-12-14 ENCOUNTER — Other Ambulatory Visit: Payer: Self-pay

## 2019-12-14 ENCOUNTER — Encounter (INDEPENDENT_AMBULATORY_CARE_PROVIDER_SITE_OTHER): Payer: Self-pay | Admitting: Primary Care

## 2019-12-14 ENCOUNTER — Ambulatory Visit (INDEPENDENT_AMBULATORY_CARE_PROVIDER_SITE_OTHER): Payer: Medicare HMO | Admitting: Primary Care

## 2019-12-14 VITALS — BP 129/81 | HR 77 | Temp 98.4°F | Ht 63.0 in | Wt 228.0 lb

## 2019-12-14 DIAGNOSIS — T7840XD Allergy, unspecified, subsequent encounter: Secondary | ICD-10-CM

## 2019-12-14 DIAGNOSIS — I10 Essential (primary) hypertension: Secondary | ICD-10-CM | POA: Diagnosis not present

## 2019-12-14 DIAGNOSIS — F5101 Primary insomnia: Secondary | ICD-10-CM | POA: Diagnosis not present

## 2019-12-14 DIAGNOSIS — J4541 Moderate persistent asthma with (acute) exacerbation: Secondary | ICD-10-CM | POA: Diagnosis not present

## 2019-12-14 MED ORDER — BUDESONIDE-FORMOTEROL FUMARATE 160-4.5 MCG/ACT IN AERO: 2.0000 | INHALATION_SPRAY | Freq: Two times a day (BID) | RESPIRATORY_TRACT | 3 refills | Status: DC

## 2019-12-14 MED ORDER — HYDROXYZINE HCL 25 MG PO TABS: 25.0000 mg | ORAL_TABLET | Freq: Every day | ORAL | 0 refills | Status: DC

## 2019-12-14 MED ORDER — METHYLPREDNISOLONE SODIUM SUCC 125 MG IJ SOLR
125.0000 mg | Freq: Once | INTRAMUSCULAR | Status: AC
  Administered 2019-12-14: 125 mg via INTRAMUSCULAR

## 2019-12-14 MED ORDER — METHYLPREDNISOLONE SODIUM SUCC 40 MG IJ SOLR: 40.0000 mg | Freq: Once | INTRAMUSCULAR | Status: DC

## 2019-12-14 MED ORDER — ALBUTEROL SULFATE HFA 108 (90 BASE) MCG/ACT IN AERS: 2.0000 | INHALATION_SPRAY | Freq: Four times a day (QID) | RESPIRATORY_TRACT | 1 refills | Status: DC | PRN

## 2019-12-14 MED ORDER — LORATADINE 10 MG PO TABS: 10.0000 mg | ORAL_TABLET | Freq: Every day | ORAL | 11 refills | Status: DC

## 2019-12-14 NOTE — Patient Instructions (Signed)
Asthma, Adult  Asthma is a long-term (chronic) condition in which the airways get tight and narrow. The airways are the breathing passages that lead from the nose and mouth down into the lungs. A person with asthma will have times when symptoms get worse. These are called asthma attacks. They can cause coughing, whistling sounds when you breathe (wheezing), shortness of breath, and chest pain. They can make it hard to breathe. There is no cure for asthma, but medicines and lifestyle changes can help control it. There are many things that can bring on an asthma attack or make asthma symptoms worse (triggers). Common triggers include:  Mold.  Dust.  Cigarette smoke.  Cockroaches.  Things that can cause allergy symptoms (allergens). These include animal skin flakes (dander) and pollen from trees or grass.  Things that pollute the air. These may include household cleaners, wood smoke, smog, or chemical odors.  Cold air, weather changes, and wind.  Crying or laughing hard.  Stress.  Certain medicines or drugs.  Certain foods such as dried fruit, potato chips, and grape juice.  Infections, such as a cold or the flu.  Certain medical conditions or diseases.  Exercise or tiring activities. Asthma may be treated with medicines and by staying away from the things that cause asthma attacks. Types of medicines may include:  Controller medicines. These help prevent asthma symptoms. They are usually taken every day.  Fast-acting reliever or rescue medicines. These quickly relieve asthma symptoms. They are used as needed and provide short-term relief.  Allergy medicines if your attacks are brought on by allergens.  Medicines to help control the body's defense (immune) system. Follow these instructions at home: Avoiding triggers in your home  Change your heating and air conditioning filter often.  Limit your use of fireplaces and wood stoves.  Get rid of pests (such as roaches and  mice) and their droppings.  Throw away plants if you see mold on them.  Clean your floors. Dust regularly. Use cleaning products that do not smell.  Have someone vacuum when you are not home. Use a vacuum cleaner with a HEPA filter if possible.  Replace carpet with wood, tile, or vinyl flooring. Carpet can trap animal skin flakes and dust.  Use allergy-proof pillows, mattress covers, and box spring covers.  Wash bed sheets and blankets every week in hot water. Dry them in a dryer.  Keep your bedroom free of any triggers.  Avoid pets and keep windows closed when things that cause allergy symptoms are in the air.  Use blankets that are made of polyester or cotton.  Clean bathrooms and kitchens with bleach. If possible, have someone repaint the walls in these rooms with mold-resistant paint. Keep out of the rooms that are being cleaned and painted.  Wash your hands often with soap and water. If soap and water are not available, use hand sanitizer.  Do not allow anyone to smoke in your home. General instructions  Take over-the-counter and prescription medicines only as told by your doctor. ? Talk with your doctor if you have questions about how or when to take your medicines. ? Make note if you need to use your medicines more often than usual.  Do not use any products that contain nicotine or tobacco, such as cigarettes and e-cigarettes. If you need help quitting, ask your doctor.  Stay away from secondhand smoke.  Avoid doing things outdoors when allergen counts are high and when air quality is low.  Wear a ski mask   when doing outdoor activities in the winter. The mask should cover your nose and mouth. Exercise indoors on cold days if you can.  Warm up before you exercise. Take time to cool down after exercise.  Use a peak flow meter as told by your doctor. A peak flow meter is a tool that measures how well the lungs are working.  Keep track of the peak flow meter's readings.  Write them down.  Follow your asthma action plan. This is a written plan for taking care of your asthma and treating your attacks.  Make sure you get all the shots (vaccines) that your doctor recommends. Ask your doctor about a flu shot and a pneumonia shot.  Keep all follow-up visits as told by your doctor. This is important. Contact a doctor if:  You have wheezing, shortness of breath, or a cough even while taking medicine to prevent attacks.  The mucus you cough up (sputum) is thicker than usual.  The mucus you cough up changes from clear or white to yellow, green, gray, or bloody.  You have problems from the medicine you are taking, such as: ? A rash. ? Itching. ? Swelling. ? Trouble breathing.  You need reliever medicines more than 2-3 times a week.  Your peak flow reading is still at 50-79% of your personal best after following the action plan for 1 hour.  You have a fever. Get help right away if:  You seem to be worse and are not responding to medicine during an asthma attack.  You are short of breath even at rest.  You get short of breath when doing very little activity.  You have trouble eating, drinking, or talking.  You have chest pain or tightness.  You have a fast heartbeat.  Your lips or fingernails start to turn blue.  You are light-headed or dizzy, or you faint.  Your peak flow is less than 50% of your personal best.  You feel too tired to breathe normally. Summary  Asthma is a long-term (chronic) condition in which the airways get tight and narrow. An asthma attack can make it hard to breathe.  Asthma cannot be cured, but medicines and lifestyle changes can help control it.  Make sure you understand how to avoid triggers and how and when to use your medicines. This information is not intended to replace advice given to you by your health care provider. Make sure you discuss any questions you have with your health care provider. Document Revised:  09/09/2018 Document Reviewed: 08/11/2016 Elsevier Patient Education  2020 Elsevier Inc.  

## 2019-12-14 NOTE — Progress Notes (Signed)
Acute Office Visit  Subjective:    Patient ID: [name] [name], female    DOB: [birth year], 59 y.o.   MRN: [id number]  Chief Complaint  Patient presents with  . Annual Exam  . Wheezing    shortness of breath    HPI [name] [name] is a 59 year old female presents today with shortness of breath, watery eyes, expiratory wheezes and states wearing the mask makes it even more difficult to breath. Blood pressure is unremarkable on amlodipine 46m and chlorthalidone 579m Denies headaches, chest pain or lower extremity edema, sudden onset, vision changes, unilateral weakness, dizziness, paresthesias  Past Medical History:  Diagnosis Date  . Bloody stool   . Dental decay   . Gastritis, Helicobacter pylori 1259/07/6382. Hypertension   . Pneumonia 06/2015  . Prepyloric ulcer 06/22/2015  . Seasonal allergies   . Shortness of breath dyspnea    with exertion    Past Surgical History:  Procedure Laterality Date  . BARTHOLIN CYST MARSUPIALIZATION Left 1997  . CARDIAC SURGERY N/A 1963   Repair of PFO  . COLONOSCOPY N/A 2002   Performed secondary to mother's diagnosis of colon CA at age 59. ESOPHAGOGASTRODUODENOSCOPY (EGD) WITH PROPOFOL N/A 06/22/2015   Procedure: ESOPHAGOGASTRODUODENOSCOPY (EGD) WITH PROPOFOL;  Surgeon: WiArta SilenceMD;  [facility];  Service: Endoscopy;  Laterality: N/A;  . FRACTURE SURGERY    . NASAL SINUS SURGERY Bilateral 1984  . OVARIAN CYST REMOVAL Left 1990  . TUBAL LIGATION    . UMBILICAL HERNIA REPAIR N/A 2005    Family History  Problem Relation Age of Onset  . Colon cancer Mother 5028. Hypertension Mother   . Hypertension Father   . Cerebral aneurysm Sister   . Goiter Sister     Social History   Socioeconomic History  . Marital status: Divorced    Spouse name: Not on file  . Number of children: 4  . Years of education: Not on file  . Highest education level: Not on file  Occupational History  . 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 Smoking status: Never Smoker  . Smokeless tobacco: Never Used  Substance and Sexual Activity  . Alcohol use: Yes    Alcohol/week: 0.0 standard drinks    Comment: 48 oz malt liquor when drinks, but not daily.  Difficult to pin down; last drink was 06/17/15  . Drug use: No  . Sexual activity: Yes  Other Topics Concern  . Not on file  Social History Narrative   [name], when initially established   Now has own apartment, lives alone   Brother and 2 sons live in town, improving relationships.   States ended up homeless due to difficulty with living situation [place]she ended up leaving to get away from them and did not have a job.     Not ready to discuss details   Social Determinants of Health   Financial Resource Strain:   . Difficulty of Paying Living Expenses:   Food Insecurity:   . Worried About RuCharity fundraisern the Last Year:   . RaArboriculturistn the Last Year:   Transportation Needs:   . LaFilm/video editorMedical):   . Marland Kitchenack of Transportation (Non-Medical):   Physical Activity:   .  Days of Exercise per Week:   . Minutes of Exercise per Session:   Stress:   . Feeling of Stress :   Social Connections:   . Frequency of Communication with Friends and Family:   . Frequency of Social Gatherings with Friends and Family:   . Attends Religious Services:   . Active Member of Clubs or Organizations:   . Attends Archivist Meetings:   Marland Kitchen Marital Status:   Intimate Partner Violence:   . Fear of Current or Ex-Partner:   . Emotionally Abused:   Marland Kitchen Physically Abused:   . Sexually Abused:     Outpatient Medications Prior to Visit  Medication Sig Dispense Refill  . amLODipine (NORVASC) 10 MG tablet Take 1 tablet (10 mg total) by mouth daily. 90 tablet 3  .  Blood Pressure Monitor KIT 1 kit by Does not apply route 3 (three) times daily as needed. 1 kit 0  . chlorthalidone (HYGROTON) 50 MG tablet Take 1 tablet (50 mg total) by mouth daily. 90 tablet 1  . albuterol (VENTOLIN HFA) 108 (90 Base) MCG/ACT inhaler Inhale 2 puffs into the lungs every 6 (six) hours as needed for wheezing or shortness of breath. 8 g 1  . loratadine (CLARITIN) 10 MG tablet Take 1 tablet (10 mg total) by mouth daily. (Patient not taking: Reported on 12/14/2019) 30 tablet 11  . omeprazole (PRILOSEC) 20 MG capsule Take 1 capsule (20 mg total) by mouth daily. 30 capsule 0  . guaiFENesin (MUCINEX) 600 MG 12 hr tablet Take 1 tablet (600 mg total) by mouth 2 (two) times daily. (Patient not taking: Reported on 10/28/2019) 60 tablet 1  . hydrOXYzine (ATARAX/VISTARIL) 25 MG tablet Take 1 tablet (25 mg total) by mouth at bedtime. (Patient not taking: Reported on 12/14/2019) 90 tablet 0   No facility-administered medications prior to visit.    Allergies  Allergen Reactions  . Tetracyclines & Related Itching    Review of Systems  Respiratory: Positive for cough, shortness of breath and wheezing.   All other systems reviewed and are negative.      Objective:    Physical Exam Vitals reviewed.  Constitutional:      Appearance: Normal appearance. She is obese.  HENT:     Head: Normocephalic.  Cardiovascular:     Rate and Rhythm: Normal rate and regular rhythm.  Pulmonary:     Effort: Respiratory distress present.     Breath sounds: Wheezing present.  Abdominal:     General: Bowel sounds are normal.  Musculoskeletal:        General: Normal range of motion.     Cervical back: Normal range of motion and neck supple.  Skin:    General: Skin is warm and dry.  Neurological:     Mental Status: She is alert and oriented to person, place, and time.  Psychiatric:        Mood and Affect: Mood normal.        Behavior: Behavior normal.        Thought Content: Thought content normal.         Judgment: Judgment normal.     BP 129/81 (BP Location: Left Arm, Patient Position: Sitting, Cuff Size: Large)   Pulse 77   Temp 98.4 F (36.9 C) (Temporal)   Ht '5\' 3"'$  (1.6 m)   Wt 228 lb (103.4 kg)   SpO2 99%   BMI 40.39 kg/m  Wt Readings from Last 3 Encounters:  12/14/19 228 lb (103.4 kg)  11/14/19 224 lb (101.6 kg)  02/22/19 214 lb 6.4 oz (97.3 kg)    Health Maintenance Due  Topic Date Due  . Hepatitis C Screening  Never done  . COVID-19 Vaccine (1) Never done  . PAP SMEAR-Modifier  Never done  . MAMMOGRAM  Never done  . COLONOSCOPY  07/21/2011    There are no preventive care reminders to display for this patient.   Lab Results  Component Value Date   TSH 3.893 04/09/2011   Lab Results  Component Value Date   WBC 7.8 10/28/2019   HGB 12.8 10/28/2019   HCT 39.5 10/28/2019   MCV 97.1 10/28/2019   PLT 237 10/28/2019   Lab Results  Component Value Date   NA 138 10/28/2019   K 3.2 (L) 10/28/2019   CO2 28 10/28/2019   GLUCOSE 105 (H) 10/28/2019   BUN 8 10/28/2019   CREATININE 0.69 10/28/2019   BILITOT 0.6 10/28/2019   ALKPHOS 74 10/28/2019   AST 18 10/28/2019   ALT 13 10/28/2019   PROT 7.2 10/28/2019   ALBUMIN 3.8 10/28/2019   CALCIUM 9.2 10/28/2019   ANIONGAP 12 10/28/2019   Lab Results  Component Value Date   CHOL 180 02/10/2019   Lab Results  Component Value Date   HDL 66 02/10/2019   Lab Results  Component Value Date   LDLCALC 93 02/10/2019   Lab Results  Component Value Date   TRIG 105 02/10/2019   Lab Results  Component Value Date   CHOLHDL 2.7 02/10/2019   Lab Results  Component Value Date   HGBA1C 5.3 02/10/2019       Assessment & Plan:  Alexyss was seen today for wheezing.  Diagnoses and all orders for this visit:  Moderate persistent asthma with acute exacerbation -     Ambulatory referral to Pulmonology -     Discontinue: methylPREDNISolone sodium succinate (SOLU-MEDROL) 40 mg/mL injection 40 mg -     albuterol  (VENTOLIN HFA) 108 (90 Base) MCG/ACT inhaler; Inhale 2 puffs into the lungs every 6 (six) hours as needed for wheezing or shortness of breath. -     budesonide-formoterol (SYMBICORT) 160-4.5 MCG/ACT inhaler; Inhale 2 puffs into the lungs 2 (two) times daily. -     methylPREDNISolone sodium succinate (SOLU-MEDROL) 125 mg/2 mL injection 125 mg  Primary insomnia Managed with atarax take as needed. Discussed  good sleep hygiene.  Stimulants such as caffeine for several hours prior to bedtime.Distractions such as TV in the bedroom. Get out of bed if you are still awake after 15 minutes. Read or do some quiet activity. Keep the lights down. Wait until you feel sleepy and back to bed. Allergy, subsequent encounter She  classic presentation of symptoms which include cough, wheeze, and shortness of breath brought on by characteristic triggers and relieved by bronchodilating medications.  This is caused by from hyper responsiveness with tendency of airways to narrow excessively in response to a stimuli.   -     loratadine (CLARITIN) 10 MG tablet; Take 1 tablet (10 mg total) by mouth daily.  Essential hypertension Controlled on amlodipine 57m and chlorthalidone 579mASH DIET; No salt or low sodium diet Take all medication as prescribed Avoid smoked meats which are high in sodium content. Avoid soda which contains sodium and are high in sugar which increases your risk for diabetes.    Meds ordered this encounter  Medications  . methylPREDNISolone sodium succinate (SOLU-MEDROL) 40 mg/mL injection 40 mg  . albuterol (VENTOLIN HFA) 108 (90  Base) MCG/ACT inhaler    Sig: Inhale 2 puffs into the lungs every 6 (six) hours as needed for wheezing or shortness of breath.    Dispense:  8 g    Refill:  1  . hydrOXYzine (ATARAX/VISTARIL) 25 MG tablet    Sig: Take 1 tablet (25 mg total) by mouth at bedtime.    Dispense:  90 tablet    Refill:  0  . budesonide-formoterol (SYMBICORT) 160-4.5 MCG/ACT inhaler     Sig: Inhale 2 puffs into the lungs 2 (two) times daily.    Dispense:  1 Inhaler    Refill:  3     Kerin Perna, NP

## 2020-01-20 ENCOUNTER — Other Ambulatory Visit (INDEPENDENT_AMBULATORY_CARE_PROVIDER_SITE_OTHER): Payer: Self-pay | Admitting: Primary Care

## 2020-02-08 ENCOUNTER — Other Ambulatory Visit (INDEPENDENT_AMBULATORY_CARE_PROVIDER_SITE_OTHER): Payer: Self-pay | Admitting: Primary Care

## 2020-02-08 DIAGNOSIS — F5101 Primary insomnia: Secondary | ICD-10-CM

## 2020-03-15 ENCOUNTER — Other Ambulatory Visit (INDEPENDENT_AMBULATORY_CARE_PROVIDER_SITE_OTHER): Payer: Self-pay | Admitting: Primary Care

## 2020-03-15 MED ORDER — AMLODIPINE BESYLATE 10 MG PO TABS: 10.0000 mg | ORAL_TABLET | Freq: Every day | ORAL | 0 refills | Status: DC

## 2020-03-15 NOTE — Telephone Encounter (Signed)
Pt request refill  amLODipine (NORVASC) 10 MG tablet  Pt states [facility] has been trying to get this med for 2 wks.  They have given her extra and told her they cannot do that anymore. She needs to contact the dr   [facility] - [place], [place] - [address] Phone:  [phone]  Fax:  [phone]

## 2020-04-16 ENCOUNTER — Other Ambulatory Visit (INDEPENDENT_AMBULATORY_CARE_PROVIDER_SITE_OTHER): Payer: Self-pay | Admitting: Primary Care

## 2020-04-16 DIAGNOSIS — J4541 Moderate persistent asthma with (acute) exacerbation: Secondary | ICD-10-CM

## 2020-04-16 NOTE — Telephone Encounter (Signed)
Sent to PCP to refill if appropriate.  

## 2020-05-03 ENCOUNTER — Other Ambulatory Visit (INDEPENDENT_AMBULATORY_CARE_PROVIDER_SITE_OTHER): Payer: Self-pay | Admitting: Primary Care

## 2020-05-03 DIAGNOSIS — F5101 Primary insomnia: Secondary | ICD-10-CM

## 2020-05-03 MED ORDER — HYDROXYZINE HCL 25 MG PO TABS: 25.0000 mg | ORAL_TABLET | Freq: Every day | ORAL | 1 refills | Status: DC

## 2020-05-03 NOTE — Telephone Encounter (Signed)
Medication Refill - Medication:  hydrOXYzine (ATARAX/VISTARIL) 25 MG tablet  Has the patient contacted their pharmacy? Yes.   (Agent: If no, request that the patient contact the pharmacy for the refill.) (Agent: If yes, when and what did the pharmacy advise?)  Preferred Pharmacy (with phone number or street name): [facility]   Agent: Please be advised that RX refills may take up to 3 business days. We ask that you follow-up with your pharmacy.

## 2020-05-04 ENCOUNTER — Telehealth (INDEPENDENT_AMBULATORY_CARE_PROVIDER_SITE_OTHER): Payer: Self-pay

## 2020-05-04 NOTE — Telephone Encounter (Signed)
Patient states she is taking hydroxyzine twice a day. She wants to know if dosage can be increased to 50 mg. Please advise. Nat Christen, CMA

## 2020-05-05 NOTE — Telephone Encounter (Signed)
Yes it can but her last visit was May needs an OV

## 2020-05-07 NOTE — Telephone Encounter (Signed)
Patient is aware and has scheduled virtual OV for 10/26 at 11:10am.

## 2020-05-15 ENCOUNTER — Other Ambulatory Visit: Payer: Self-pay

## 2020-05-15 ENCOUNTER — Telehealth: Payer: Self-pay | Admitting: Primary Care

## 2020-05-15 ENCOUNTER — Telehealth (HOSPITAL_BASED_OUTPATIENT_CLINIC_OR_DEPARTMENT_OTHER): Payer: Medicare Other | Admitting: Family Medicine

## 2020-05-15 ENCOUNTER — Encounter (INDEPENDENT_AMBULATORY_CARE_PROVIDER_SITE_OTHER): Payer: Self-pay | Admitting: Primary Care

## 2020-05-15 DIAGNOSIS — F5101 Primary insomnia: Secondary | ICD-10-CM

## 2020-05-15 DIAGNOSIS — I10 Essential (primary) hypertension: Secondary | ICD-10-CM

## 2020-05-15 DIAGNOSIS — F418 Other specified anxiety disorders: Secondary | ICD-10-CM

## 2020-05-15 MED ORDER — HYDROXYZINE HCL 25 MG PO TABS: 25.0000 mg | ORAL_TABLET | Freq: Three times a day (TID) | ORAL | 2 refills | Status: DC | PRN

## 2020-05-15 MED ORDER — CHLORTHALIDONE 50 MG PO TABS: 50.0000 mg | ORAL_TABLET | Freq: Every day | ORAL | 1 refills | Status: DC

## 2020-05-15 MED ORDER — FLUOXETINE HCL 20 MG PO TABS: 20.0000 mg | ORAL_TABLET | Freq: Every day | ORAL | 3 refills | Status: DC

## 2020-05-15 NOTE — Progress Notes (Signed)
Virtual Visit via Telephone Note  I connected with [name] [name], on 05/15/2020 at 11:23 AM by telephone due to the COVID-19 pandemic and verified that I am speaking with the correct person using two identifiers.   Consent: I discussed the limitations, risks, security and privacy concerns of performing an evaluation and management service by telephone and the availability of in person appointments. I also discussed with the patient that there may be a patient responsible charge related to this service. The patient expressed understanding and agreed to proceed.   Location of Patient: Home  Location of Provider: Car   Persons participating in Telemedicine visit: [name] [name]-CMA Dr. Margarita Rana     History of Present Illness: 59 [facility] a [facility], asthma, anxiety and depression seen today for anxiety concerns.   She has ongoing Anxiety; has difficulty getting on the high way and going to [place].  She is currently on hydroxyzine which is prescribed to be used at bedtime for insomnia and anxiety however she is finding she needs to take it during the day and is wondering if the dose can be increased. Also requests refill of chlorthalidone for hypertension.  Past Medical History:  Diagnosis Date  . Bloody stool   . Dental decay   . Gastritis, Helicobacter pylori 99/02/3381  . Hypertension   . Pneumonia 06/2015  . Prepyloric ulcer 06/22/2015  . Seasonal allergies   . Shortness of breath dyspnea    with exertion   Allergies  Allergen Reactions  . Tetracyclines & Related Itching    Current Outpatient Medications on File Prior to Visit  Medication Sig Dispense Refill  . albuterol (VENTOLIN HFA) 108 (90 Base) MCG/ACT inhaler INHALE 2 PUFFS INTO THE LUNGS EVERY 6 (SIX) HOURS AS NEEDED FOR WHEEZING OR SHORTNESS OF BREATH. 18 g 0  . amLODipine (NORVASC) 10 MG tablet Take 1 tablet (10 mg total) by mouth  daily. 90 tablet 0  . Blood Pressure Monitor KIT 1 kit by Does not apply route 3 (three) times daily as needed. 1 kit 0  . budesonide-formoterol (SYMBICORT) 160-4.5 MCG/ACT inhaler Inhale 2 puffs into the lungs 2 (two) times daily. 1 Inhaler 3  . chlorthalidone (HYGROTON) 50 MG tablet Take 1 tablet (50 mg total) by mouth daily. 90 tablet 1  . diclofenac Sodium (VOLTAREN) 1 % GEL SMARTSIG:4 Gram(s) Topical Twice Daily PRN    . hydrOXYzine (ATARAX/VISTARIL) 25 MG tablet Take 1 tablet (25 mg total) by mouth at bedtime. 90 tablet 1  . lisinopril-hydrochlorothiazide (ZESTORETIC) 20-25 MG tablet TAKE ONE TABLET BY MOUTH ONCE DAILY 90 tablet 0  . loratadine (CLARITIN) 10 MG tablet Take 1 tablet (10 mg total) by mouth daily. 30 tablet 11  . traZODone (DESYREL) 50 MG tablet Take 50 mg by mouth at bedtime.    Marland Kitchen omeprazole (PRILOSEC) 20 MG capsule Take 1 capsule (20 mg total) by mouth daily. 30 capsule 0   No current facility-administered medications on file prior to visit.    Observations/Objective: Awake, alert, and 2x3 Not in acute distress  Assessment and Plan: 1. Primary insomnia Controlled - hydrOXYzine (ATARAX/VISTARIL) 25 MG tablet; Take 1 tablet (25 mg total) by mouth every 8 (eight) hours as needed.  Dispense: 90 tablet; Refill: 2  2. Anxiety with depression Uncontrolled We will add an SSRI to her regimen and frequency of hydroxyzine Discussed onset of action of SSRIs She is planning to also see a therapist - FLUoxetine (PROZAC) 20 MG  tablet; Take 1 tablet (20 mg total) by mouth daily.  Dispense: 30 tablet; Refill: 3  3. Essential hypertension Stable She is due for a basic metabolic panel as last potassium was 3.2 in 10/9353 - Basic Metabolic Panel; Future - chlorthalidone (HYGROTON) 50 MG tablet; Take 1 tablet (50 mg total) by mouth daily.  Dispense: 90 tablet; Refill: 1   Follow Up Instructions: 3 months with Lizabeth Leyden, NP   I discussed the assessment and treatment  plan with the patient. The patient was provided an opportunity to ask questions and all were answered. The patient agreed with the plan and demonstrated an understanding of the instructions.   The patient was advised to call back or seek an in-person evaluation if the symptoms worsen or if the condition fails to improve as anticipated.     I provided 12 minutes total of non-face-to-face time during this encounter including median intraservice time, reviewing previous notes, investigations, ordering medications, medical decision making, coordinating care and patient verbalized understanding at the end of the visit.     Charlott Rakes, MD, FAAFP. [facility] [place], [place]   05/15/2020, 11:23 AM

## 2020-05-15 NOTE — Telephone Encounter (Signed)
Patient would like for you to contact her.

## 2020-05-15 NOTE — Telephone Encounter (Signed)
Copied from [place] [id number]. Topic: General - Call Back - No Documentation >> May 15, 2020 12:02 PM Erick Blinks wrote: [phone] pt would like to speak to [facility], she wants to get in touch with the social worker.

## 2020-05-18 NOTE — Telephone Encounter (Signed)
Patient is  Requesting a call back [place]- [phone]

## 2020-05-18 NOTE — Telephone Encounter (Signed)
Return call placed to patient. LCSW left a message requesting a return call.

## 2020-05-24 NOTE — Telephone Encounter (Signed)
Return call placed to patient and detailed message left requesting a return call.

## 2020-05-28 ENCOUNTER — Other Ambulatory Visit (INDEPENDENT_AMBULATORY_CARE_PROVIDER_SITE_OTHER): Payer: Medicare Other

## 2020-06-22 ENCOUNTER — Ambulatory Visit (HOSPITAL_COMMUNITY)
Admission: EM | Admit: 2020-06-22 | Discharge: 2020-06-22 | Disposition: A | Discharge status: Home / Self Care | Payer: Medicare Other | Attending: Family Medicine | Admitting: Family Medicine

## 2020-06-22 ENCOUNTER — Encounter (HOSPITAL_COMMUNITY): Payer: Self-pay | Admitting: Emergency Medicine

## 2020-06-22 ENCOUNTER — Other Ambulatory Visit: Payer: Self-pay

## 2020-06-22 DIAGNOSIS — S161XXA Strain of muscle, fascia and tendon at neck level, initial encounter: Secondary | ICD-10-CM

## 2020-06-22 MED ORDER — PREDNISONE 10 MG PO TABS: ORAL_TABLET | ORAL | 0 refills | Status: DC

## 2020-06-22 MED ORDER — TRIAMCINOLONE ACETONIDE 40 MG/ML IJ SUSP
INTRAMUSCULAR | Status: AC
  Filled 2020-06-22: qty 1

## 2020-06-22 MED ORDER — TRIAMCINOLONE ACETONIDE 40 MG/ML IJ SUSP
40.0000 mg | Freq: Once | INTRAMUSCULAR | Status: AC
  Administered 2020-06-22: 40 mg via INTRAMUSCULAR

## 2020-06-22 MED ORDER — CYCLOBENZAPRINE HCL 10 MG PO TABS: 10.0000 mg | ORAL_TABLET | Freq: Three times a day (TID) | ORAL | 0 refills | Status: DC | PRN

## 2020-06-22 NOTE — ED Provider Notes (Signed)
[place]    CSN: [id number] Arrival date & time: 06/22/20  1144      History   Chief Complaint Chief Complaint  Patient presents with  . Neck Pain    HPI [name] [name] is a 59 y.o. female.   Here today for 3 day hx of let sided neck and upper back pain that she believes started after twisting around in her car seat to put her granddaughter into her carseat. The pain did not start immediately after but that is the only motion that was out of the ordinary for her. She denies numbness or tingling down left arm, headache, dizziness, CP, SOB, palpitations, swelling, bruising or discoloration. No past hx of neck injuries. Taking NSAIDs wtihout relief.      Past Medical History:  Diagnosis Date  . Bloody stool   . Dental decay   . Gastritis, Helicobacter pylori 40/03/8118  . Hypertension   . Pneumonia 06/2015  . Prepyloric ulcer 06/22/2015  . Seasonal allergies   . Shortness of breath dyspnea    with exertion    Patient Active Problem List   Diagnosis Date Noted  . Anxiety with depression 10/06/2017  . Healthcare maintenance 10/06/2017  . Arthritis of left knee 10/06/2017  . Alcohol use disorder 07/06/2015  . Prepyloric ulcer 06/22/2015  . Essential hypertension 04/14/2015  . Seasonal allergies 04/14/2015    Past Surgical History:  Procedure Laterality Date  . BARTHOLIN CYST MARSUPIALIZATION Left 1997  . CARDIAC SURGERY N/A 1963   Repair of PFO  . COLONOSCOPY N/A 2002   Performed secondary to mother's diagnosis of colon CA at age 47  . ESOPHAGOGASTRODUODENOSCOPY (EGD) WITH PROPOFOL N/A 06/22/2015   Procedure: ESOPHAGOGASTRODUODENOSCOPY (EGD) WITH PROPOFOL;  Surgeon: Arta Silence, MD;  Location: [facility] ENDOSCOPY;  Service: Endoscopy;  Laterality: N/A;  . FRACTURE SURGERY    . NASAL SINUS SURGERY Bilateral 1984  . OVARIAN CYST REMOVAL Left 1990  . TUBAL LIGATION    . UMBILICAL HERNIA REPAIR N/A 2005    OB History   No obstetric history on file.       Home Medications    Prior to Admission medications   Medication Sig Start Date End Date Taking? Authorizing Provider  albuterol (VENTOLIN HFA) 108 (90 Base) MCG/ACT inhaler INHALE 2 PUFFS INTO THE LUNGS EVERY 6 (SIX) HOURS AS NEEDED FOR WHEEZING OR SHORTNESS OF BREATH. 04/17/20   Kerin Perna, NP  amLODipine (NORVASC) 10 MG tablet Take 1 tablet (10 mg total) by mouth daily. 03/15/20   Kerin Perna, NP  Blood Pressure Monitor KIT 1 kit by Does not apply route 3 (three) times daily as needed. 09/01/19   Kerin Perna, NP  budesonide-formoterol (SYMBICORT) 160-4.5 MCG/ACT inhaler Inhale 2 puffs into the lungs 2 (two) times daily. 12/14/19   Kerin Perna, NP  chlorthalidone (HYGROTON) 50 MG tablet Take 1 tablet (50 mg total) by mouth daily. 05/15/20   Charlott Rakes, MD  cyclobenzaprine (FLEXERIL) 10 MG tablet Take 1 tablet (10 mg total) by mouth 3 (three) times daily as needed for muscle spasms. DO NOT DRINK ALCOHOL OR DRIVE WHILE TAKING THIS MEDICATION 06/22/20   Volney American, PA-C  diclofenac Sodium (VOLTAREN) 1 % GEL SMARTSIG:4 Gram(s) Topical Twice Daily PRN 04/25/20   [provider]  FLUoxetine (PROZAC) 20 MG tablet Take 1 tablet (20 mg total) by mouth daily. 05/15/20   Charlott Rakes, MD  hydrOXYzine (ATARAX/VISTARIL) 25 MG tablet Take 1 tablet (25 mg total)  by mouth every 8 (eight) hours as needed. 05/15/20   Charlott Rakes, MD  lisinopril-hydrochlorothiazide (ZESTORETIC) 20-25 MG tablet TAKE ONE TABLET BY MOUTH ONCE DAILY 01/20/20   Kerin Perna, NP  loratadine (CLARITIN) 10 MG tablet Take 1 tablet (10 mg total) by mouth daily. 12/14/19   Kerin Perna, NP  omeprazole (PRILOSEC) 20 MG capsule Take 1 capsule (20 mg total) by mouth daily. 10/28/19 11/27/19  Filbert Berthold, MD  predniSONE (DELTASONE) 10 MG tablet Take 6 tabs day one, 5 tabs day two, 4 tabs day three, etc 06/22/20   Volney American, PA-C  traZODone (DESYREL) 50 MG  tablet Take 50 mg by mouth at bedtime. 04/13/20   [provider]    Family History Family History  Problem Relation Age of Onset  . Colon cancer Mother 75  . Hypertension Mother   . Hypertension Father   . Cerebral aneurysm Sister   . Goiter Sister     Social History Social History   Tobacco Use  . Smoking status: Never Smoker  . Smokeless tobacco: Never Used  Substance Use Topics  . Alcohol use: Yes    Alcohol/week: 0.0 standard drinks    Comment: 48 oz malt liquor when drinks, but not daily.  Difficult to pin down; last drink was 06/17/15  . Drug use: No     Allergies   Tetracyclines & related   Review of Systems Review of Systems PER HPI   Physical Exam Triage Vital Signs ED Triage Vitals [06/22/20 1321]  Enc Vitals Group     BP (!) 165/76     Pulse Rate 92     Resp 18     Temp 98 F (36.7 C)     Temp src      SpO2 94 %     Weight      Height      Head Circumference      Peak Flow      Pain Score 8     Pain Loc      Pain Edu?      Excl. in [place]?    No data found.  Updated Vital Signs BP (!) 165/76 (BP Location: Right Arm)   Pulse 92   Temp 98 F (36.7 C)   Resp 18   SpO2 94%   Visual Acuity Right Eye Distance:   Left Eye Distance:   Bilateral Distance:    Right Eye Near:   Left Eye Near:    Bilateral Near:     Physical Exam Vitals and nursing note reviewed.  Constitutional:      Appearance: Normal appearance. She is not ill-appearing.  HENT:     Head: Atraumatic.     Mouth/Throat:     Mouth: Mucous membranes are moist.     Pharynx: Oropharynx is clear.  Eyes:     Extraocular Movements: Extraocular movements intact.     Conjunctiva/sclera: Conjunctivae normal.  Cardiovascular:     Rate and Rhythm: Normal rate and regular rhythm.     Pulses: Normal pulses.     Heart sounds: Normal heart sounds.  Pulmonary:     Effort: Pulmonary effort is normal.     Breath sounds: Normal breath sounds. No wheezing or rales.   Abdominal:     General: Bowel sounds are normal. There is no distension.     Palpations: Abdomen is soft.     Tenderness: There is no abdominal tenderness. There is no guarding.  Musculoskeletal:  General: Tenderness (significant ttp left trapezius and latissimus with spasm) present. No swelling or deformity.     Cervical back: Normal range of motion and neck supple.     Comments: Poor ability to perform ROM exam due to extent of pain with movement of neck, though can touch neck to chest and extend head behind No midline c spine ttp  Skin:    General: Skin is warm and dry.  Neurological:     Mental Status: She is alert and oriented to person, place, and time.     Sensory: No sensory deficit.     Gait: Gait normal.  Psychiatric:        Mood and Affect: Mood normal.        Thought Content: Thought content normal.        Judgment: Judgment normal.     UC Treatments / Results  Labs (all labs ordered are listed, but only abnormal results are displayed) Labs Reviewed - No data to display  EKG   Radiology No results found.  Procedures Procedures (including critical care time)  Medications Ordered in UC Medications  triamcinolone acetonide (KENALOG-40) injection 40 mg (40 mg Intramuscular Given 06/22/20 1432)    Initial Impression / Assessment and Plan / UC Course  I have reviewed the triage vital signs and the nursing notes.  Pertinent labs & imaging results that were available during my care of the patient were reviewed by me and considered in my medical decision making (see chart for details).     Suspect cervical strain, will tx with IM kenalog today, then start prednisone taper, flexeril at home tomorrow and perform massage, heat, and rest. Return precautions reviewed for worsening sxs.   Final Clinical Impressions(s) / UC Diagnoses   Final diagnoses:  Acute strain of neck muscle, initial encounter   Discharge Instructions   None    ED Prescriptions     Medication Sig Dispense Auth. Provider   predniSONE (DELTASONE) 10 MG tablet Take 6 tabs day one, 5 tabs day two, 4 tabs day three, etc 21 tablet Volney American, PA-C   cyclobenzaprine (FLEXERIL) 10 MG tablet Take 1 tablet (10 mg total) by mouth 3 (three) times daily as needed for muscle spasms. DO NOT DRINK ALCOHOL OR DRIVE WHILE TAKING THIS MEDICATION 30 tablet Volney American, [place]     PDMP not reviewed this encounter.   Volney American, [place] 06/22/20 1811

## 2020-06-22 NOTE — ED Triage Notes (Signed)
Patient presents to [facility] for assessment of 1 week of worserning neck pain.  States she does not recall any injury, does not remember exactly how it started, but it has been worsening and now is moving into her neck.

## 2020-07-11 ENCOUNTER — Other Ambulatory Visit: Payer: Self-pay | Admitting: Family Medicine

## 2020-07-11 NOTE — Telephone Encounter (Signed)
Requested medication (s) are due for refill today: yes  Requested medication (s) are on the active medication list: yes  Last refill:  06/22/20 #30  Future visit scheduled: yes  Notes to clinic:  Please review for refill. Refill not delegated per protocol.    Requested Prescriptions  Pending Prescriptions Disp Refills   cyclobenzaprine (FLEXERIL) 10 MG tablet [Pharmacy Med Name: CYCLOBENZAPRINE HCL 10 MG ORAL TABLET] 30 tablet 0    Sig: Take 1 tablet (10 mg total) by mouth 3 (three) times daily as needed for muscle spasms. DO NOT DRINK ALCOHOL OR DRIVE WHILE TAKING THIS MEDICATION      Not Delegated - Analgesics:  Muscle Relaxants Failed - 07/11/2020  9:37 AM      Failed - This refill cannot be delegated      Passed - Valid encounter within last 6 months    Recent Outpatient Visits           1 month ago Anxiety with depression   [place], [place], MD   7 months ago Moderate persistent asthma with acute exacerbation   [facility] Kerin Perna, NP   9 months ago Essential hypertension   [place], Michelle P, NP   1 year ago Special screening for malignant neoplasms, colon   [place] Kerin Perna, NP   1 year ago Essential hypertension   [place], [place], NP       Future Appointments             In 6 days Kerin Perna, NP [place]

## 2020-07-17 ENCOUNTER — Other Ambulatory Visit (INDEPENDENT_AMBULATORY_CARE_PROVIDER_SITE_OTHER): Payer: Self-pay | Admitting: Primary Care

## 2020-07-17 ENCOUNTER — Ambulatory Visit (INDEPENDENT_AMBULATORY_CARE_PROVIDER_SITE_OTHER): Payer: Medicare Other | Admitting: Primary Care

## 2020-07-17 MED ORDER — AMLODIPINE BESYLATE 10 MG PO TABS: 10.0000 mg | ORAL_TABLET | Freq: Every day | ORAL | 0 refills | Status: DC

## 2020-07-17 NOTE — Telephone Encounter (Signed)
Copied from CRM [id number]. Topic: Quick Communication - Rx Refill/Question >> Jul 17, 2020  9:25 AM Jaquita Rector A wrote: Medication: amLODipine (NORVASC) 10 MG tablet  Completely out need Rx today   Has the patient contacted their pharmacy? Yes.   (Agent: If no, request that the patient contact the pharmacy for the refill.) (Agent: If yes, when and what did the pharmacy advise?)  Preferred Pharmacy (with phone number or street name): [facility] - [place], [place] - [address]  Phone:  [phone] Fax:  [phone]     Agent: Please be advised that RX refills may take up to 3 business days. We ask that you follow-up with your pharmacy.

## 2020-07-26 ENCOUNTER — Telehealth (INDEPENDENT_AMBULATORY_CARE_PROVIDER_SITE_OTHER): Payer: Medicare Other | Admitting: Primary Care

## 2020-08-15 ENCOUNTER — Other Ambulatory Visit: Payer: Self-pay | Admitting: Family Medicine

## 2020-08-15 DIAGNOSIS — I10 Essential (primary) hypertension: Secondary | ICD-10-CM

## 2020-09-04 ENCOUNTER — Telehealth (INDEPENDENT_AMBULATORY_CARE_PROVIDER_SITE_OTHER): Payer: Medicare Other | Admitting: Primary Care

## 2020-09-05 ENCOUNTER — Telehealth (INDEPENDENT_AMBULATORY_CARE_PROVIDER_SITE_OTHER): Payer: Medicare Other | Admitting: Primary Care

## 2020-09-05 ENCOUNTER — Encounter (INDEPENDENT_AMBULATORY_CARE_PROVIDER_SITE_OTHER): Payer: Self-pay | Admitting: Primary Care

## 2020-09-05 ENCOUNTER — Other Ambulatory Visit: Payer: Self-pay

## 2020-09-05 DIAGNOSIS — G8929 Other chronic pain: Secondary | ICD-10-CM

## 2020-09-05 DIAGNOSIS — M25562 Pain in left knee: Secondary | ICD-10-CM | POA: Diagnosis not present

## 2020-09-05 DIAGNOSIS — M25561 Pain in right knee: Secondary | ICD-10-CM

## 2020-09-05 MED ORDER — IBUPROFEN 800 MG PO TABS: 800.0000 mg | ORAL_TABLET | Freq: Three times a day (TID) | ORAL | 1 refills | Status: DC | PRN

## 2020-09-05 MED ORDER — DICLOFENAC SODIUM 1 % EX GEL: CUTANEOUS | 1 refills | Status: DC

## 2020-09-05 NOTE — Progress Notes (Signed)
Virtual Visit via Telephone Note  I connected with [name] [name] on 09/05/20 at  9:10 AM EST by telephone and verified that I am speaking with the correct person using two identifiers.  Location: Patient: home Provider: Kerin Perna '@[email or web address]'    I discussed the limitations, risks, security and privacy concerns of performing an evaluation and management service by telephone and the availability of in person appointments. I also discussed with the patient that there may be a patient responsible charge related to this service. The patient expressed understanding and agreed to proceed.   History of Present Illness: [name] is a 60 year old female who has been bilateral knee for the last 3 years that the pain is progressive  Worst. Pain 8/10 throbbing intermittent. She does not want although she was told TKR. Does not want to go to pain therapy. Asked for percocet explained did not prescribe narcotics. Agreed to ibuprofen.    Past Medical History:  Diagnosis Date  . Bloody stool   . Dental decay   . Gastritis, Helicobacter pylori 45/12/2561  . Hypertension   . Pneumonia 06/2015  . Prepyloric ulcer 06/22/2015  . Seasonal allergies   . Shortness of breath dyspnea    with exertion   Current Outpatient Medications on File Prior to Visit  Medication Sig Dispense Refill  . albuterol (VENTOLIN HFA) 108 (90 Base) MCG/ACT inhaler INHALE 2 PUFFS INTO THE LUNGS EVERY 6 (SIX) HOURS AS NEEDED FOR WHEEZING OR SHORTNESS OF BREATH. 18 g 0  . amLODipine (NORVASC) 10 MG tablet Take 1 tablet (10 mg total) by mouth daily. 90 tablet 0  . Blood Pressure Monitor KIT 1 kit by Does not apply route 3 (three) times daily as needed. 1 kit 0  . budesonide-formoterol (SYMBICORT) 160-4.5 MCG/ACT inhaler Inhale 2 puffs into the lungs 2 (two) times daily. 1 Inhaler 3  . chlorthalidone (HYGROTON) 50 MG tablet TAKE 1 TABLET (50 MG TOTAL) BY MOUTH DAILY. 90 tablet 0  . cyclobenzaprine (FLEXERIL) 10 MG tablet TAKE 1  TABLET (10 MG TOTAL) BY MOUTH 3 (THREE) TIMES DAILY AS NEEDED FOR MUSCLE SPASMS. DO NOT DRINK ALCOHOL OR DRIVE WHILE TAKING THIS MEDICATION 30 tablet 0  . diclofenac Sodium (VOLTAREN) 1 % GEL SMARTSIG:4 Gram(s) Topical Twice Daily PRN    . FLUoxetine (PROZAC) 20 MG tablet Take 1 tablet (20 mg total) by mouth daily. 30 tablet 3  . hydrOXYzine (ATARAX/VISTARIL) 25 MG tablet Take 1 tablet (25 mg total) by mouth every 8 (eight) hours as needed. 90 tablet 2  . lisinopril-hydrochlorothiazide (ZESTORETIC) 20-25 MG tablet TAKE ONE TABLET BY MOUTH ONCE DAILY 90 tablet 0  . loratadine (CLARITIN) 10 MG tablet Take 1 tablet (10 mg total) by mouth daily. 30 tablet 11  . traZODone (DESYREL) 50 MG tablet Take 50 mg by mouth at bedtime.    Marland Kitchen omeprazole (PRILOSEC) 20 MG capsule Take 1 capsule (20 mg total) by mouth daily. 30 capsule 0   No current facility-administered medications on file prior to visit.   Observations/Objective: VS not  Taken. Pertinent positive and negative noted in HPI  Assessment and Plan: [name] [name] was seen today for medication management.  Diagnoses and all orders for this visit:  Chronic pain of both knees Refuses TKR will tx with conservative methods. Work on losing weight to help reduce joint pain. May alternate with heat and ice application for pain relie Other alternatives include massage, acupuncture and water aerobics.  You must stay active and avoid  a sedentary lifestyle. -     ibuprofen (ADVIL) 800 MG tablet; Take 1 tablet (800 mg total) by mouth every 8 (eight) hours as needed for moderate pain. -     diclofenac Sodium (VOLTAREN) 1 % GEL; SMARTSIG:4 Gram(s) Topical Twice Daily PRN    Follow Up Instructions:    I discussed the assessment and treatment plan with the patient. The patient was provided an opportunity to ask questions and all were answered. The patient agreed with the plan and demonstrated an understanding of the instructions.   The patient was advised to call  back or seek an in-person evaluation if the symptoms worsen or if the condition fails to improve as anticipated.  I provided 15 minutes of non-face-to-face time during this encounter.   Kerin Perna, NP

## 2020-09-05 NOTE — Progress Notes (Signed)
Pt would like something stronger for knee pain  voltaren gel is not relieving pain Wants something she can take as needed

## 2020-10-08 ENCOUNTER — Other Ambulatory Visit (INDEPENDENT_AMBULATORY_CARE_PROVIDER_SITE_OTHER): Payer: Self-pay | Admitting: Primary Care

## 2020-10-08 ENCOUNTER — Other Ambulatory Visit: Payer: Self-pay | Admitting: Family Medicine

## 2020-10-08 DIAGNOSIS — F418 Other specified anxiety disorders: Secondary | ICD-10-CM

## 2020-10-08 DIAGNOSIS — J4541 Moderate persistent asthma with (acute) exacerbation: Secondary | ICD-10-CM

## 2020-11-05 ENCOUNTER — Other Ambulatory Visit: Payer: Self-pay | Admitting: Family Medicine

## 2020-11-05 DIAGNOSIS — F5101 Primary insomnia: Secondary | ICD-10-CM

## 2020-11-05 NOTE — Telephone Encounter (Signed)
Requested Prescriptions  Pending Prescriptions Disp Refills  . hydrOXYzine (ATARAX/VISTARIL) 25 MG tablet [Pharmacy Med Name: HYDROXYZINE HCL 25 MG ORAL TABLET] 90 tablet 2    Sig: TAKE 1 TABLET (25 MG TOTAL) BY MOUTH EVERY 8 (EIGHT) HOURS AS NEEDED.     Ear, Nose, and Throat:  Antihistamines Passed - 11/05/2020 10:56 AM      Passed - Valid encounter within last 12 months    Recent Outpatient Visits          2 months ago Chronic pain of both knees   [facility] Kerin Perna, NP   5 months ago Anxiety with depression   [place], [place], MD   10 months ago Moderate persistent asthma with acute exacerbation   [facility] Kerin Perna, NP   1 year ago Essential hypertension   [place], Michelle P, NP   1 year ago Special screening for malignant neoplasms, colon   [place] Kerin Perna, NP

## 2020-11-27 ENCOUNTER — Other Ambulatory Visit (INDEPENDENT_AMBULATORY_CARE_PROVIDER_SITE_OTHER): Payer: Self-pay | Admitting: Primary Care

## 2020-11-27 DIAGNOSIS — J4541 Moderate persistent asthma with (acute) exacerbation: Secondary | ICD-10-CM

## 2020-11-27 DIAGNOSIS — G8929 Other chronic pain: Secondary | ICD-10-CM

## 2020-11-29 ENCOUNTER — Other Ambulatory Visit (INDEPENDENT_AMBULATORY_CARE_PROVIDER_SITE_OTHER): Payer: Self-pay | Admitting: Primary Care

## 2021-02-06 ENCOUNTER — Other Ambulatory Visit (INDEPENDENT_AMBULATORY_CARE_PROVIDER_SITE_OTHER): Payer: Self-pay | Admitting: Primary Care

## 2021-02-06 DIAGNOSIS — G8929 Other chronic pain: Secondary | ICD-10-CM

## 2021-02-06 DIAGNOSIS — M25562 Pain in left knee: Secondary | ICD-10-CM

## 2021-02-06 NOTE — Telephone Encounter (Signed)
Sent to PCP ?

## 2021-02-22 ENCOUNTER — Ambulatory Visit (INDEPENDENT_AMBULATORY_CARE_PROVIDER_SITE_OTHER): Payer: Medicare Other | Admitting: Primary Care

## 2021-03-11 ENCOUNTER — Ambulatory Visit (INDEPENDENT_AMBULATORY_CARE_PROVIDER_SITE_OTHER): Payer: Medicare Other | Admitting: Primary Care

## 2021-03-11 ENCOUNTER — Ambulatory Visit: Payer: Medicare Other | Admitting: Physician Assistant

## 2021-03-11 ENCOUNTER — Other Ambulatory Visit: Payer: Self-pay

## 2021-03-11 VITALS — BP 142/89 | HR 80 | Temp 98.2°F | Resp 20 | Ht 63.0 in | Wt 240.0 lb

## 2021-03-11 DIAGNOSIS — J4541 Moderate persistent asthma with (acute) exacerbation: Secondary | ICD-10-CM

## 2021-03-11 DIAGNOSIS — I10 Essential (primary) hypertension: Secondary | ICD-10-CM

## 2021-03-11 DIAGNOSIS — M25552 Pain in left hip: Secondary | ICD-10-CM | POA: Diagnosis not present

## 2021-03-11 DIAGNOSIS — F418 Other specified anxiety disorders: Secondary | ICD-10-CM

## 2021-03-11 DIAGNOSIS — M25512 Pain in left shoulder: Secondary | ICD-10-CM

## 2021-03-11 DIAGNOSIS — J4520 Mild intermittent asthma, uncomplicated: Secondary | ICD-10-CM | POA: Diagnosis not present

## 2021-03-11 DIAGNOSIS — G8929 Other chronic pain: Secondary | ICD-10-CM

## 2021-03-11 DIAGNOSIS — Z1231 Encounter for screening mammogram for malignant neoplasm of breast: Secondary | ICD-10-CM

## 2021-03-11 MED ORDER — IBUPROFEN 800 MG PO TABS: 800.0000 mg | ORAL_TABLET | Freq: Three times a day (TID) | ORAL | 0 refills | Status: DC | PRN

## 2021-03-11 MED ORDER — SYMBICORT 160-4.5 MCG/ACT IN AERO: 2.0000 | INHALATION_SPRAY | Freq: Two times a day (BID) | RESPIRATORY_TRACT | 0 refills | Status: DC

## 2021-03-11 MED ORDER — CHLORTHALIDONE 50 MG PO TABS: 50.0000 mg | ORAL_TABLET | Freq: Every day | ORAL | 1 refills | Status: DC

## 2021-03-11 MED ORDER — FLUOXETINE HCL 20 MG PO CAPS: 20.0000 mg | ORAL_CAPSULE | Freq: Every day | ORAL | 1 refills | Status: DC

## 2021-03-11 MED ORDER — AMLODIPINE BESYLATE 10 MG PO TABS: 10.0000 mg | ORAL_TABLET | Freq: Every day | ORAL | 1 refills | Status: DC

## 2021-03-11 MED ORDER — TRAZODONE HCL 50 MG PO TABS: 50.0000 mg | ORAL_TABLET | Freq: Every day | ORAL | 1 refills | Status: DC

## 2021-03-11 NOTE — Patient Instructions (Signed)
Please return to the mobile unit in the morning for fasting labs to be completed.  I placed an order for you to have an x-ray completed of your left shoulder as well as your left hip.  We will call you with the lab results and x-ray results.  I encourage you to increase your hydration, do gentle stretching, use Voltaren as needed, and then use ibuprofen as needed.  Your blood pressure is slightly elevated today, I encourage you to check your blood pressure at home, keep a written log and have available for all office visits.  Kennieth Rad, PA-C Physician Assistant [facility] [email or web address]   Shoulder Pain Many things can cause shoulder pain, including: An injury to the shoulder. Overuse of the shoulder. Arthritis. The source of the pain can be: Inflammation. An injury to the shoulder joint. An injury to a tendon, ligament, or bone. Follow these instructions at home: Pay attention to changes in your symptoms. Let your health care provider knowabout them. Follow these instructions to relieve your pain. If you have a sling: Wear the sling as told by your health care provider. Remove it only as told by your health care provider. Loosen the sling if your fingers tingle, become numb, or turn cold and blue. Keep the sling clean. If the sling is not waterproof: Do not let it get wet. Remove it to shower or bathe. Move your arm as little as possible, but keep your hand moving to prevent swelling. Managing pain, stiffness, and swelling  If directed, put ice on the painful area: Put ice in a plastic bag. Place a towel between your skin and the bag. Leave the ice on for 20 minutes, 2-3 times per day. Stop applying ice if it does not help with the pain. Squeeze a soft ball or a foam pad as much as possible. This helps to keep the shoulder from swelling. It also helps to strengthen the arm.  General instructions Take  over-the-counter and prescription medicines only as told by your health care provider. Keep all follow-up visits as told by your health care provider. This is important. Contact a health care provider if: Your pain gets worse. Your pain is not relieved with medicines. New pain develops in your arm, hand, or fingers. Get help right away if: Your arm, hand, or fingers: Tingle. Become numb. Become swollen. Become painful. Turn white or blue. Summary Shoulder pain can be caused by an injury, overuse, or arthritis. Pay attention to changes in your symptoms. Let your health care provider know about them. This condition may be treated with a sling, ice, and pain medicines. Contact your health care provider if the pain gets worse or new pain develops. Get help right away if your arm, hand, or fingers tingle or become numb, swollen, or painful. Keep all follow-up visits as told by your health care provider. This is important. This information is not intended to replace advice given to you by your health care provider. Make sure you discuss any questions you have with your healthcare provider. Document Revised: 01/19/2018 Document Reviewed: 01/19/2018 Elsevier Patient Education  [place].

## 2021-03-11 NOTE — Progress Notes (Signed)
Established Patient Office Visit  Subjective:  Patient ID: [name] [name], female    DOB: [birth year]  Age: 60 y.o. MRN: [id number]  CC:  Chief Complaint  Patient presents with   Cyst    Left under arm and outer left thigh    HPI [name] [name] reports that she has been having pain in her left hip and her left axilla.  States that she broke her tibia in 2005, states that her pain in her left hip also is present in her left thigh, states the pain has been present for approximately the past year.  Describes the pain in her left axilla has been present for the past 3 to 4 months.  Describes her pain in both areas as sore and achy.  Does endorse that she sleeps on her left side and has been trying to sleep more on her right side to help resolve pain.  Denies numbness or tingling.  Reports that she will use ibuprofen and Voltaren with moderate relief.   Request refills of her other medications, states that she has continued to use the Prozac and hydroxyzine with relief of her anxiety and depression.  States that she is taking the trazodone with relief of insomnia yet.  Reports that she does not use the Symbicort on a daily basis, states she only uses it if "her allergies are bad".  States she does not check her blood pressure at home.  Denies any hypertensive symptoms.    Past Medical History:  Diagnosis Date   Bloody stool    Dental decay    Gastritis, Helicobacter pylori 00/02/6760   Hypertension    Pneumonia 06/2015   Prepyloric ulcer 06/22/2015   Seasonal allergies    Shortness of breath dyspnea    with exertion    Past Surgical History:  Procedure Laterality Date   BARTHOLIN CYST MARSUPIALIZATION Left 1997   CARDIAC SURGERY N/A 1963   Repair of PFO   COLONOSCOPY N/A 2002   Performed secondary to mother's diagnosis of colon CA at age 22   ESOPHAGOGASTRODUODENOSCOPY (EGD) WITH PROPOFOL N/A 06/22/2015   Procedure: ESOPHAGOGASTRODUODENOSCOPY (EGD) WITH PROPOFOL;  Surgeon:  Arta Silence, MD;  Location: [facility];  Service: Endoscopy;  Laterality: N/A;   FRACTURE SURGERY     NASAL SINUS SURGERY Bilateral 1984   OVARIAN CYST REMOVAL Left 1990   TUBAL LIGATION     UMBILICAL HERNIA REPAIR N/A 2005    Family History  Problem Relation Age of Onset   Colon cancer Mother 40   Hypertension Mother    Hypertension Father    Cerebral aneurysm Sister    Goiter Sister     Social History   Socioeconomic History   Marital status: Divorced    Spouse name: Not on file   Number of children: 4   Years of education: Not on file   Highest education level: Not on file  Occupational History   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  Tobacco Use   Smoking status: Never   Smokeless tobacco: Never  Substance and Sexual Activity   Alcohol use: Yes    Alcohol/week: 0.0 standard drinks    Comment: 48 oz malt liquor when drinks, but not daily.  Difficult to pin down; last drink was 06/17/15   Drug use: No   Sexual activity: Yes  Other Topics Concern   Not on file  Social History Narrative   Born in [place], but  grew up in [place]   Was homeless Sept. 2016, when initially established   Now has own apartment, lives alone   Brother and 2 sons live in town, improving relationships.   States ended up homeless due to difficulty with living situation when children moved to [place] to live with her--she ended up leaving to get away from them and did not have a job.     Not ready to discuss details   Social Determinants of Health   Financial Resource Strain: Not on file  Food Insecurity: Not on file  Transportation Needs: Not on file  Physical Activity: Not on file  Stress: Not on file  Social Connections: Not on file  Intimate Partner Violence: Not on file    Outpatient Medications Prior to Visit  Medication Sig Dispense Refill   albuterol (VENTOLIN HFA) 108 (90 Base) MCG/ACT  inhaler INHALE 2 PUFFS INTO THE LUNGS EVERY 6 (SIX) HOURS AS NEEDED FOR WHEEZING OR SHORTNESS OF BREATH. 18 g 0   Blood Pressure Monitor KIT 1 kit by Does not apply route 3 (three) times daily as needed. 1 kit 0   diclofenac Sodium (VOLTAREN) 1 % GEL APPLY 4 GRAM(S) TOPICALLY TWICE DAILY AS NEEDED 350 g 1   hydrOXYzine (ATARAX/VISTARIL) 25 MG tablet TAKE 1 TABLET (25 MG TOTAL) BY MOUTH EVERY 8 (EIGHT) HOURS AS NEEDED. 90 tablet 2   loratadine (CLARITIN) 10 MG tablet Take 1 tablet (10 mg total) by mouth daily. 30 tablet 11   amLODipine (NORVASC) 10 MG tablet TAKE 1 TABLET (10 MG TOTAL) BY MOUTH DAILY. 30 tablet 0   chlorthalidone (HYGROTON) 50 MG tablet TAKE 1 TABLET (50 MG TOTAL) BY MOUTH DAILY. 90 tablet 0   FLUoxetine (PROZAC) 20 MG capsule TAKE ONE CAPSULE BY MOUTH ONCE DAILY 30 capsule 0   ibuprofen (ADVIL) 800 MG tablet TAKE 1 TABLET (800 MG TOTAL) BY MOUTH EVERY 8 (EIGHT) HOURS AS NEEDED FOR MODERATE PAIN. 10 tablet 0   lisinopril-hydrochlorothiazide (ZESTORETIC) 20-25 MG tablet TAKE ONE TABLET BY MOUTH ONCE DAILY 90 tablet 0   SYMBICORT 160-4.5 MCG/ACT inhaler INHALE 2 PUFFS INTO THE LUNGS 2 (TWO) TIMES DAILY. 1 each 0   traZODone (DESYREL) 50 MG tablet Take 50 mg by mouth at bedtime.     cyclobenzaprine (FLEXERIL) 10 MG tablet TAKE 1 TABLET (10 MG TOTAL) BY MOUTH 3 (THREE) TIMES DAILY AS NEEDED FOR MUSCLE SPASMS. DO NOT DRINK ALCOHOL OR DRIVE WHILE TAKING THIS MEDICATION (Patient not taking: Reported on 03/11/2021) 30 tablet 0   omeprazole (PRILOSEC) 20 MG capsule Take 1 capsule (20 mg total) by mouth daily. (Patient not taking: Reported on 03/11/2021) 30 capsule 0   No facility-administered medications prior to visit.    Allergies  Allergen Reactions   Tetracyclines & Related Itching    ROS Review of Systems  Constitutional: Negative.   HENT: Negative.    Eyes: Negative.   Respiratory:  Negative for shortness of breath.   Cardiovascular:  Negative for chest pain.  Endocrine:  Negative.   Genitourinary: Negative.   Musculoskeletal:  Positive for arthralgias and back pain. Negative for joint swelling.  Skin: Negative.   Allergic/Immunologic: Negative.   Neurological: Negative.   Hematological: Negative.   Psychiatric/Behavioral: Negative.       Objective:    Physical Exam Vitals and nursing note reviewed.  Constitutional:      Appearance: Normal appearance.  HENT:     Head: Normocephalic and atraumatic.     Right Ear: External ear  normal.     Left Ear: External ear normal.     Nose: Nose normal.     Mouth/Throat:     Mouth: Mucous membranes are moist.     Pharynx: Oropharynx is clear.  Eyes:     Extraocular Movements: Extraocular movements intact.     Conjunctiva/sclera: Conjunctivae normal.     Pupils: Pupils are equal, round, and reactive to light.  Cardiovascular:     Rate and Rhythm: Normal rate and regular rhythm.     Pulses: Normal pulses.     Heart sounds: Normal heart sounds.  Musculoskeletal:     Right shoulder: Normal.     Left shoulder: Bony tenderness and crepitus present. No swelling. Decreased range of motion.     Cervical back: Normal, normal range of motion and neck supple.     Thoracic back: Normal.     Lumbar back: No swelling. Decreased range of motion.     Right hip: Normal.     Left hip: Bony tenderness and crepitus present. Decreased range of motion.  Skin:    General: Skin is warm and dry.  Neurological:     General: No focal deficit present.     Mental Status: She is alert and oriented to person, place, and time.  Psychiatric:        Mood and Affect: Mood normal.        Behavior: Behavior normal.        Thought Content: Thought content normal.        Judgment: Judgment normal.    BP (!) 142/89 (BP Location: Left Arm, Patient Position: Sitting, Cuff Size: Large)   Pulse 80   Temp 98.2 F (36.8 C) (Oral)   Resp 20   Ht '5\' 3"'  (1.6 m)   Wt 240 lb (108.9 kg)   SpO2 97%   BMI 42.51 kg/m  Wt Readings from Last 3  Encounters:  03/11/21 240 lb (108.9 kg)  12/14/19 228 lb (103.4 kg)  11/14/19 224 lb (101.6 kg)     Health Maintenance Due  Topic Date Due   COVID-19 Vaccine (1) Never done   Pneumococcal Vaccine 49-40 Years old (1 - PCV) Never done   Hepatitis C Screening  Never done   PAP SMEAR-Modifier  Never done   MAMMOGRAM  Never done   Zoster Vaccines- Shingrix (1 of 2) Never done   COLONOSCOPY (Pts 45-53yr Insurance coverage will need to be confirmed)  07/21/2011   INFLUENZA VACCINE  02/18/2021    There are no preventive care reminders to display for this patient.  Lab Results  Component Value Date   TSH 3.893 04/09/2011   Lab Results  Component Value Date   WBC 7.8 10/28/2019   HGB 12.8 10/28/2019   HCT 39.5 10/28/2019   MCV 97.1 10/28/2019   PLT 237 10/28/2019   Lab Results  Component Value Date   NA 138 10/28/2019   K 3.2 (L) 10/28/2019   CO2 28 10/28/2019   GLUCOSE 105 (H) 10/28/2019   BUN 8 10/28/2019   CREATININE 0.69 10/28/2019   BILITOT 0.6 10/28/2019   ALKPHOS 74 10/28/2019   AST 18 10/28/2019   ALT 13 10/28/2019   PROT 7.2 10/28/2019   ALBUMIN 3.8 10/28/2019   CALCIUM 9.2 10/28/2019   ANIONGAP 12 10/28/2019   Lab Results  Component Value Date   CHOL 180 02/10/2019   Lab Results  Component Value Date   HDL 66 02/10/2019   Lab Results  Component Value Date  Mark 93 02/10/2019   Lab Results  Component Value Date   TRIG 105 02/10/2019   Lab Results  Component Value Date   CHOLHDL 2.7 02/10/2019   Lab Results  Component Value Date   HGBA1C 5.3 02/10/2019      Assessment & Plan:   Problem List Items Addressed This Visit       Cardiovascular and Mediastinum   Elevated blood pressure reading with diagnosis of hypertension   Relevant Medications   amLODipine (NORVASC) 10 MG tablet   chlorthalidone (HYGROTON) 50 MG tablet     Respiratory   Mild intermittent asthma without complication   Relevant Medications   SYMBICORT 160-4.5  MCG/ACT inhaler     Other   Anxiety with depression   Relevant Medications   FLUoxetine (PROZAC) 20 MG capsule   traZODone (DESYREL) 50 MG tablet   Chronic left hip pain - Primary   Relevant Medications   FLUoxetine (PROZAC) 20 MG capsule   ibuprofen (ADVIL) 800 MG tablet   traZODone (DESYREL) 50 MG tablet   Other Relevant Orders   DG HIP UNILAT W OR W/O PELVIS 2-3 VIEWS LEFT   Chronic left shoulder pain   Relevant Medications   FLUoxetine (PROZAC) 20 MG capsule   ibuprofen (ADVIL) 800 MG tablet   traZODone (DESYREL) 50 MG tablet   Other Relevant Orders   Vitamin D, 25-hydroxy   DG Shoulder Left   Other Visit Diagnoses     Screening mammogram for breast cancer       Relevant Orders   MM DIGITAL SCREENING BILATERAL       Meds ordered this encounter  Medications   amLODipine (NORVASC) 10 MG tablet    Sig: Take 1 tablet (10 mg total) by mouth daily.    Dispense:  30 tablet    Refill:  1    Order Specific Question:   Supervising Provider    Answer:   Elsie Stain [[id number]]   chlorthalidone (HYGROTON) 50 MG tablet    Sig: Take 1 tablet (50 mg total) by mouth daily.    Dispense:  30 tablet    Refill:  1    Order Specific Question:   Supervising Provider    Answer:   Elsie Stain [[id number]]   FLUoxetine (PROZAC) 20 MG capsule    Sig: Take 1 capsule (20 mg total) by mouth daily.    Dispense:  30 capsule    Refill:  1    Order Specific Question:   Supervising Provider    Answer:   Asencion Noble E [[id number]]   ibuprofen (ADVIL) 800 MG tablet    Sig: Take 1 tablet (800 mg total) by mouth every 8 (eight) hours as needed for moderate pain.    Dispense:  30 tablet    Refill:  0    Order Specific Question:   Supervising Provider    Answer:   Elsie Stain [[id number]]   SYMBICORT 160-4.5 MCG/ACT inhaler    Sig: Inhale 2 puffs into the lungs 2 (two) times daily.    Dispense:  1 each    Refill:  0    Order Specific Question:   Supervising Provider    Answer:   Asencion Noble E [[id number]]   traZODone (DESYREL) 50 MG tablet    Sig: Take 1 tablet (50 mg total) by mouth at bedtime.    Dispense:  30 tablet    Refill:  1    Order Specific Question:   Supervising Provider  Answer:   Asencion Noble E [[id number]]   1. Chronic left hip pain Patient education given on supportive care, continue ibuprofen as needed, red flags given for prompt reevaluation - DG HIP UNILAT W OR W/O PELVIS 2-3 VIEWS LEFT; Future  2. Chronic left shoulder pain Patient education given on supportive care - Vitamin D, 25-hydroxy; Future - DG Shoulder Left; Future - ibuprofen (ADVIL) 800 MG tablet; Take 1 tablet (800 mg total) by mouth every 8 (eight) hours as needed for moderate pain.  Dispense: 30 tablet; Refill: 0  3. Mild intermittent asthma without complication Continue current regimen - SYMBICORT 160-4.5 MCG/ACT inhaler; Inhale 2 puffs into the lungs 2 (two) times daily.  Dispense: 1 each; Refill: 0  4. Essential hypertension Continue current regimen.  Patient encouraged to check blood pressure at home on a daily basis, keep a written log and have available for all office visits.  Patient to return to mobile unit for fasting labs. - amLODipine (NORVASC) 10 MG tablet; Take 1 tablet (10 mg total) by mouth daily.  Dispense: 30 tablet; Refill: 1 - chlorthalidone (HYGROTON) 50 MG tablet; Take 1 tablet (50 mg total) by mouth daily.  Dispense: 30 tablet; Refill: 1 - CBC with Differential/Platelet; Future - Comp. Metabolic Panel (12); Future - Lipid panel; Future - TSH; Future  5. Elevated blood pressure reading with diagnosis of hypertension   6. Anxiety with depression Continue current regimen - FLUoxetine (PROZAC) 20 MG capsule; Take 1 capsule (20 mg total) by mouth daily.  Dispense: 30 capsule; Refill: 1 - traZODone (DESYREL) 50 MG tablet; Take 1 tablet (50 mg total) by mouth at bedtime.  Dispense: 30 tablet; Refill: 1  7. Screening mammogram for breast cancer  - MM DIGITAL  SCREENING BILATERAL; Future   I have reviewed the patient's medical history (PMH, PSH, Social History, Family History, Medications, and allergies) , and have been updated if relevant. I spent 36 minutes reviewing chart and  face to face time with patient.   Follow-up: Return in about 1 day (around 03/12/2021) for Fasting  labs.    Loraine Grip Mayers, PA-C

## 2021-03-11 NOTE — Progress Notes (Signed)
Patient has eaten today and patient has taken BP medication today Patient reports discomfort in the inner thigh and underarm of left side. Discomfort increases with movement.  Patients appointment was cancelled by PCP due to overbooking.

## 2021-03-12 ENCOUNTER — Ambulatory Visit (HOSPITAL_COMMUNITY)
Admission: RE | Admit: 2021-03-12 | Discharge: 2021-03-12 | Disposition: A | Discharge status: Home / Self Care | Payer: Medicare Other | Source: Ambulatory Visit | Attending: Physician Assistant | Admitting: Physician Assistant

## 2021-03-12 ENCOUNTER — Other Ambulatory Visit: Payer: Medicare Other

## 2021-03-12 DIAGNOSIS — M25512 Pain in left shoulder: Secondary | ICD-10-CM | POA: Insufficient documentation

## 2021-03-12 DIAGNOSIS — G8929 Other chronic pain: Secondary | ICD-10-CM | POA: Diagnosis present

## 2021-03-12 DIAGNOSIS — I10 Essential (primary) hypertension: Secondary | ICD-10-CM

## 2021-03-12 DIAGNOSIS — M25552 Pain in left hip: Secondary | ICD-10-CM | POA: Insufficient documentation

## 2021-03-12 DIAGNOSIS — J4520 Mild intermittent asthma, uncomplicated: Secondary | ICD-10-CM | POA: Insufficient documentation

## 2021-03-12 DIAGNOSIS — R2232 Localized swelling, mass and lump, left upper limb: Secondary | ICD-10-CM | POA: Diagnosis present

## 2021-03-12 NOTE — Progress Notes (Signed)
Patient was fasting today and took BP medication with water only. Patient tolerated blood draw well with two sticks. 1 left hand stick that was successful filling 1 tube and blowing. Patient tolerated antecubital draw in left arm with no concerns

## 2021-03-13 ENCOUNTER — Telehealth: Payer: Self-pay | Admitting: *Deleted

## 2021-03-13 NOTE — Telephone Encounter (Signed)
Patient is aware of xray showing osteoarthritis in the hip and left shoulder. Patient has appointment with ortho on Thursday and will await blood work results.

## 2021-03-13 NOTE — Addendum Note (Signed)
Addended by: Kennieth Rad on: 03/13/2021 09:26 AM   Modules accepted: Orders

## 2021-03-13 NOTE — Telephone Encounter (Signed)
-----   Message from Kennieth Rad, [place] sent at 03/13/2021  9:26 AM EDT ----- Please call patient and let her know that her hip x-ray did show moderate osteoarthritis, her left shoulder x-ray showed minimal osteoarthritis but did show a small spur.  I have started a referral for her to be seen by orthopedics for further evaluation.

## 2021-03-18 ENCOUNTER — Encounter (HOSPITAL_COMMUNITY): Payer: Self-pay | Admitting: Internal Medicine

## 2021-03-18 ENCOUNTER — Emergency Department (HOSPITAL_COMMUNITY): Payer: Medicare Other

## 2021-03-18 ENCOUNTER — Other Ambulatory Visit: Payer: Self-pay

## 2021-03-18 ENCOUNTER — Inpatient Hospital Stay (HOSPITAL_COMMUNITY)
Admission: EM | Admit: 2021-03-18 | Discharge: 2021-03-25 | DRG: 291 | Disposition: A | Discharge status: Home / Self Care | Payer: Medicare Other | Attending: Family Medicine | Admitting: Family Medicine

## 2021-03-18 DIAGNOSIS — I110 Hypertensive heart disease with heart failure: Principal | ICD-10-CM | POA: Diagnosis present

## 2021-03-18 DIAGNOSIS — M160 Bilateral primary osteoarthritis of hip: Secondary | ICD-10-CM | POA: Diagnosis present

## 2021-03-18 DIAGNOSIS — N390 Urinary tract infection, site not specified: Secondary | ICD-10-CM | POA: Diagnosis not present

## 2021-03-18 DIAGNOSIS — J302 Other seasonal allergic rhinitis: Secondary | ICD-10-CM | POA: Diagnosis present

## 2021-03-18 DIAGNOSIS — J4521 Mild intermittent asthma with (acute) exacerbation: Secondary | ICD-10-CM | POA: Diagnosis not present

## 2021-03-18 DIAGNOSIS — Z6841 Body Mass Index (BMI) 40.0 and over, adult: Secondary | ICD-10-CM

## 2021-03-18 DIAGNOSIS — F418 Other specified anxiety disorders: Secondary | ICD-10-CM | POA: Diagnosis present

## 2021-03-18 DIAGNOSIS — Z79899 Other long term (current) drug therapy: Secondary | ICD-10-CM

## 2021-03-18 DIAGNOSIS — Z800 Family history of malignant neoplasm of digestive organs: Secondary | ICD-10-CM

## 2021-03-18 DIAGNOSIS — Z20822 Contact with and (suspected) exposure to covid-19: Secondary | ICD-10-CM | POA: Diagnosis present

## 2021-03-18 DIAGNOSIS — R0602 Shortness of breath: Secondary | ICD-10-CM

## 2021-03-18 DIAGNOSIS — F419 Anxiety disorder, unspecified: Secondary | ICD-10-CM | POA: Diagnosis present

## 2021-03-18 DIAGNOSIS — F32A Depression, unspecified: Secondary | ICD-10-CM | POA: Diagnosis present

## 2021-03-18 DIAGNOSIS — R2232 Localized swelling, mass and lump, left upper limb: Secondary | ICD-10-CM | POA: Diagnosis present

## 2021-03-18 DIAGNOSIS — J9600 Acute respiratory failure, unspecified whether with hypoxia or hypercapnia: Secondary | ICD-10-CM | POA: Diagnosis present

## 2021-03-18 DIAGNOSIS — I5033 Acute on chronic diastolic (congestive) heart failure: Secondary | ICD-10-CM | POA: Diagnosis present

## 2021-03-18 DIAGNOSIS — M159 Polyosteoarthritis, unspecified: Secondary | ICD-10-CM | POA: Diagnosis present

## 2021-03-18 DIAGNOSIS — J4541 Moderate persistent asthma with (acute) exacerbation: Secondary | ICD-10-CM

## 2021-03-18 DIAGNOSIS — J9621 Acute and chronic respiratory failure with hypoxia: Secondary | ICD-10-CM | POA: Diagnosis present

## 2021-03-18 DIAGNOSIS — B9620 Unspecified Escherichia coli [E. coli] as the cause of diseases classified elsewhere: Secondary | ICD-10-CM | POA: Diagnosis not present

## 2021-03-18 DIAGNOSIS — R0902 Hypoxemia: Secondary | ICD-10-CM

## 2021-03-18 DIAGNOSIS — J9622 Acute and chronic respiratory failure with hypercapnia: Secondary | ICD-10-CM | POA: Diagnosis present

## 2021-03-18 DIAGNOSIS — Z8249 Family history of ischemic heart disease and other diseases of the circulatory system: Secondary | ICD-10-CM

## 2021-03-18 DIAGNOSIS — F1010 Alcohol abuse, uncomplicated: Secondary | ICD-10-CM | POA: Diagnosis present

## 2021-03-18 DIAGNOSIS — Z7951 Long term (current) use of inhaled steroids: Secondary | ICD-10-CM

## 2021-03-18 DIAGNOSIS — E662 Morbid (severe) obesity with alveolar hypoventilation: Secondary | ICD-10-CM | POA: Diagnosis present

## 2021-03-18 DIAGNOSIS — Z881 Allergy status to other antibiotic agents status: Secondary | ICD-10-CM

## 2021-03-18 DIAGNOSIS — K5900 Constipation, unspecified: Secondary | ICD-10-CM | POA: Diagnosis present

## 2021-03-18 DIAGNOSIS — E876 Hypokalemia: Secondary | ICD-10-CM | POA: Diagnosis present

## 2021-03-18 HISTORY — DX: Unspecified asthma, uncomplicated: J45.909

## 2021-03-18 LAB — BLOOD GAS, VENOUS
Acid-Base Excess: 0.9 mmol/L (ref 0.0–2.0)
Bicarbonate: 27.5 mmol/L (ref 20.0–28.0)
O2 Saturation: 94.9 %
Patient temperature: 98.6
pCO2, Ven: 56.3 mmHg (ref 44.0–60.0)
pH, Ven: 7.311 (ref 7.250–7.430)
pO2, Ven: 85.1 mmHg — ABNORMAL HIGH (ref 32.0–45.0)

## 2021-03-18 LAB — CBC WITH DIFFERENTIAL/PLATELET
Abs Immature Granulocytes: 0.03 10*3/uL (ref 0.00–0.07)
Basophils Absolute: 0 10*3/uL (ref 0.0–0.1)
Basophils Relative: 0 %
Eosinophils Absolute: 0.1 10*3/uL (ref 0.0–0.5)
Eosinophils Relative: 1 %
HCT: 32.4 % — ABNORMAL LOW (ref 36.0–46.0)
Hemoglobin: 10.3 g/dL — ABNORMAL LOW (ref 12.0–15.0)
Immature Granulocytes: 0 %
Lymphocytes Relative: 13 %
Lymphs Abs: 1 10*3/uL (ref 0.7–4.0)
MCH: 29.4 pg (ref 26.0–34.0)
MCHC: 31.8 g/dL (ref 30.0–36.0)
MCV: 92.6 fL (ref 80.0–100.0)
Monocytes Absolute: 0.3 10*3/uL (ref 0.1–1.0)
Monocytes Relative: 4 %
Neutro Abs: 6.2 10*3/uL (ref 1.7–7.7)
Neutrophils Relative %: 82 %
Platelets: 193 10*3/uL (ref 150–400)
RBC: 3.5 MIL/uL — ABNORMAL LOW (ref 3.87–5.11)
RDW: 13.2 % (ref 11.5–15.5)
WBC: 7.6 10*3/uL (ref 4.0–10.5)
nRBC: 0 % (ref 0.0–0.2)

## 2021-03-18 LAB — RESP PANEL BY RT-PCR (FLU A&B, COVID) ARPGX2
Influenza A by PCR: NEGATIVE
Influenza B by PCR: NEGATIVE
SARS Coronavirus 2 by RT PCR: NEGATIVE

## 2021-03-18 LAB — COMPREHENSIVE METABOLIC PANEL
ALT: 18 U/L (ref 0–44)
AST: 17 U/L (ref 15–41)
Albumin: 3.8 g/dL (ref 3.5–5.0)
Alkaline Phosphatase: 57 U/L (ref 38–126)
Anion gap: 10 (ref 5–15)
BUN: 8 mg/dL (ref 6–20)
CO2: 32 mmol/L (ref 22–32)
Calcium: 9.2 mg/dL (ref 8.9–10.3)
Chloride: 98 mmol/L (ref 98–111)
Creatinine, Ser: 0.56 mg/dL (ref 0.44–1.00)
GFR, Estimated: >60 mL/min (ref >60)
Glucose, Bld: 196 mg/dL — ABNORMAL HIGH (ref 70–99)
Potassium: 2.6 mmol/L — CL (ref 3.5–5.1)
Sodium: 140 mmol/L (ref 135–145)
Total Bilirubin: 0.5 mg/dL (ref 0.3–1.2)
Total Protein: 7.4 g/dL (ref 6.5–8.1)

## 2021-03-18 LAB — BASIC METABOLIC PANEL
Anion gap: 11 (ref 5–15)
BUN: 13 mg/dL (ref 6–20)
CO2: 31 mmol/L (ref 22–32)
Calcium: 9.4 mg/dL (ref 8.9–10.3)
Chloride: 100 mmol/L (ref 98–111)
Creatinine, Ser: 0.68 mg/dL (ref 0.44–1.00)
GFR, Estimated: >60 mL/min (ref >60)
Glucose, Bld: 187 mg/dL — ABNORMAL HIGH (ref 70–99)
Potassium: 3.7 mmol/L (ref 3.5–5.1)
Sodium: 142 mmol/L (ref 135–145)

## 2021-03-18 LAB — ETHANOL: Alcohol, Ethyl (B): <10 mg/dL (ref <10)

## 2021-03-18 LAB — MAGNESIUM: Magnesium: 1.7 mg/dL (ref 1.7–2.4)

## 2021-03-18 LAB — BRAIN NATRIURETIC PEPTIDE: B Natriuretic Peptide: 52.2 pg/mL (ref 0.0–100.0)

## 2021-03-18 MED ORDER — ONDANSETRON HCL 4 MG/2ML IJ SOLN: 4.0000 mg | Freq: Four times a day (QID) | INTRAMUSCULAR | Status: DC | PRN

## 2021-03-18 MED ORDER — DICLOFENAC SODIUM 1 % EX GEL
4.0000 g | Freq: Two times a day (BID) | CUTANEOUS | Status: DC
  Administered 2021-03-18 – 2021-03-25 (×12): 4 g via TOPICAL
  Filled 2021-03-18: qty 100

## 2021-03-18 MED ORDER — ONDANSETRON HCL 4 MG PO TABS: 4.0000 mg | ORAL_TABLET | Freq: Four times a day (QID) | ORAL | Status: DC | PRN

## 2021-03-18 MED ORDER — HYDROXYZINE HCL 25 MG PO TABS
25.0000 mg | ORAL_TABLET | Freq: Three times a day (TID) | ORAL | Status: DC | PRN
  Administered 2021-03-18 – 2021-03-24 (×6): 25 mg via ORAL
  Filled 2021-03-18 (×7): qty 1

## 2021-03-18 MED ORDER — DM-GUAIFENESIN ER 30-600 MG PO TB12: 1.0000 | ORAL_TABLET | Freq: Two times a day (BID) | ORAL | Status: DC | PRN

## 2021-03-18 MED ORDER — ENOXAPARIN SODIUM 40 MG/0.4ML IJ SOSY
40.0000 mg | PREFILLED_SYRINGE | INTRAMUSCULAR | Status: DC
  Administered 2021-03-18 – 2021-03-24 (×7): 40 mg via SUBCUTANEOUS
  Filled 2021-03-18 (×7): qty 0.4

## 2021-03-18 MED ORDER — IPRATROPIUM-ALBUTEROL 0.5-2.5 (3) MG/3ML IN SOLN
6.0000 mL | Freq: Once | RESPIRATORY_TRACT | Status: AC
  Administered 2021-03-18: 6 mL via RESPIRATORY_TRACT
  Filled 2021-03-18: qty 3

## 2021-03-18 MED ORDER — ADULT MULTIVITAMIN W/MINERALS CH
1.0000 | ORAL_TABLET | Freq: Every day | ORAL | Status: DC
  Administered 2021-03-18 – 2021-03-25 (×7): 1 via ORAL
  Filled 2021-03-18 (×7): qty 1

## 2021-03-18 MED ORDER — ALBUTEROL SULFATE (2.5 MG/3ML) 0.083% IN NEBU
2.5000 mg | INHALATION_SOLUTION | RESPIRATORY_TRACT | Status: DC | PRN
  Administered 2021-03-18: 2.5 mg via RESPIRATORY_TRACT
  Filled 2021-03-18: qty 3

## 2021-03-18 MED ORDER — FLUOXETINE HCL 20 MG PO CAPS
20.0000 mg | ORAL_CAPSULE | Freq: Every day | ORAL | Status: DC
  Administered 2021-03-18 – 2021-03-25 (×7): 20 mg via ORAL
  Filled 2021-03-18 (×7): qty 1

## 2021-03-18 MED ORDER — LORAZEPAM 1 MG PO TABS
1.0000 mg | ORAL_TABLET | ORAL | Status: AC | PRN
  Administered 2021-03-20: 1 mg via ORAL
  Filled 2021-03-18: qty 2
  Filled 2021-03-18: qty 1

## 2021-03-18 MED ORDER — POTASSIUM CHLORIDE 10 MEQ/100ML IV SOLN
10.0000 meq | INTRAVENOUS | Status: AC
  Administered 2021-03-18 (×2): 10 meq via INTRAVENOUS
  Filled 2021-03-18 (×2): qty 100

## 2021-03-18 MED ORDER — MORPHINE SULFATE (PF) 2 MG/ML IV SOLN
2.0000 mg | INTRAVENOUS | Status: DC | PRN
Start: 2021-03-18 — End: 2021-03-19

## 2021-03-18 MED ORDER — ACETAMINOPHEN 325 MG PO TABS
650.0000 mg | ORAL_TABLET | Freq: Four times a day (QID) | ORAL | Status: DC | PRN
  Administered 2021-03-20 – 2021-03-21 (×2): 650 mg via ORAL
  Filled 2021-03-18 (×2): qty 2

## 2021-03-18 MED ORDER — METHYLPREDNISOLONE SODIUM SUCC 125 MG IJ SOLR
60.0000 mg | Freq: Every day | INTRAMUSCULAR | Status: DC
  Administered 2021-03-18: 60 mg via INTRAVENOUS
  Filled 2021-03-18: qty 2

## 2021-03-18 MED ORDER — FOLIC ACID 1 MG PO TABS
1.0000 mg | ORAL_TABLET | Freq: Every day | ORAL | Status: DC
  Administered 2021-03-18 – 2021-03-25 (×7): 1 mg via ORAL
  Filled 2021-03-18 (×7): qty 1

## 2021-03-18 MED ORDER — BISACODYL 5 MG PO TBEC
5.0000 mg | DELAYED_RELEASE_TABLET | Freq: Every day | ORAL | Status: DC | PRN
  Administered 2021-03-18: 5 mg via ORAL
  Filled 2021-03-18: qty 1

## 2021-03-18 MED ORDER — ALBUTEROL SULFATE (2.5 MG/3ML) 0.083% IN NEBU: 2.5000 mg | INHALATION_SOLUTION | Freq: Three times a day (TID) | RESPIRATORY_TRACT | Status: DC

## 2021-03-18 MED ORDER — POTASSIUM CHLORIDE 20 MEQ PO PACK
40.0000 meq | PACK | Freq: Once | ORAL | Status: AC
  Administered 2021-03-18: 40 meq via ORAL
  Filled 2021-03-18: qty 2

## 2021-03-18 MED ORDER — SENNOSIDES-DOCUSATE SODIUM 8.6-50 MG PO TABS: 1.0000 | ORAL_TABLET | Freq: Every evening | ORAL | Status: DC | PRN

## 2021-03-18 MED ORDER — TRAZODONE HCL 50 MG PO TABS
50.0000 mg | ORAL_TABLET | Freq: Every day | ORAL | Status: DC
  Administered 2021-03-18: 50 mg via ORAL
  Filled 2021-03-18: qty 1

## 2021-03-18 MED ORDER — THIAMINE HCL 100 MG/ML IJ SOLN
100.0000 mg | Freq: Every day | INTRAMUSCULAR | Status: DC
  Administered 2021-03-19: 100 mg via INTRAVENOUS
  Filled 2021-03-18 (×2): qty 2

## 2021-03-18 MED ORDER — THIAMINE HCL 100 MG PO TABS
100.0000 mg | ORAL_TABLET | Freq: Every day | ORAL | Status: DC
  Administered 2021-03-18 – 2021-03-25 (×7): 100 mg via ORAL
  Filled 2021-03-18 (×7): qty 1

## 2021-03-18 MED ORDER — ACETAMINOPHEN 650 MG RE SUPP: 650.0000 mg | Freq: Four times a day (QID) | RECTAL | Status: DC | PRN

## 2021-03-18 MED ORDER — HYDROCODONE-ACETAMINOPHEN 5-325 MG PO TABS
1.0000 | ORAL_TABLET | ORAL | Status: DC | PRN
  Administered 2021-03-18 (×2): 2 via ORAL
  Filled 2021-03-18 (×2): qty 2

## 2021-03-18 MED ORDER — LORATADINE 10 MG PO TABS
10.0000 mg | ORAL_TABLET | Freq: Every day | ORAL | Status: DC
  Administered 2021-03-18 – 2021-03-25 (×7): 10 mg via ORAL
  Filled 2021-03-18 (×7): qty 1

## 2021-03-18 MED ORDER — FLUTICASONE FUROATE-VILANTEROL 200-25 MCG/INH IN AEPB
1.0000 | INHALATION_SPRAY | Freq: Every day | RESPIRATORY_TRACT | Status: DC
  Administered 2021-03-18: 1 via RESPIRATORY_TRACT
  Filled 2021-03-18: qty 28

## 2021-03-18 MED ORDER — AMLODIPINE BESYLATE 10 MG PO TABS
10.0000 mg | ORAL_TABLET | Freq: Every day | ORAL | Status: DC
  Administered 2021-03-20 – 2021-03-25 (×6): 10 mg via ORAL
  Filled 2021-03-18 (×7): qty 1

## 2021-03-18 MED ORDER — METOPROLOL TARTRATE 5 MG/5ML IV SOLN
5.0000 mg | Freq: Four times a day (QID) | INTRAVENOUS | Status: DC | PRN
Start: 2021-03-18 — End: 2021-03-19

## 2021-03-18 MED ORDER — VITAMIN D 25 MCG (1000 UNIT) PO TABS
1000.0000 [IU] | ORAL_TABLET | Freq: Every day | ORAL | Status: DC
  Administered 2021-03-18 – 2021-03-25 (×7): 1000 [IU] via ORAL
  Filled 2021-03-18 (×7): qty 1

## 2021-03-18 NOTE — ED Provider Notes (Signed)
[facility] DEPT Provider Note   CSN: [id number] Arrival date & time: 03/18/21  0093     History Chief Complaint  Patient presents with   Shortness of Breath    [name] [name] is a 60 y.o. female.  60 y.o female with a PMH of HTN, anxiety, Asthma presents to the ED via EMS with a chief complaint of shortness of breath for the past 2 weeks.  Patient reports she was originally evaluated at her PCPs office approximately a week ago, had x-rays of bilateral shoulders due to her ongoing OA.  Has been taking ibuprofen for pain control.  States 4 days ago, her symptoms worsened including a sore throat, body aches, cough with some green sputum.  She has been taking her albuterol inhaler, along with prednisone.  However, does report she thought that the albuterol was of prednisone and has been using it more frequently.  She also received 125 of Solu-Medrol in route.  Patient does not wear oxygen at baseline, however was placed on 2 L of oxygen via nasal cannula as her oxygen saturations were around the low 90s.  The shortness of breath is exacerbated with movement, however she also attributed to her bilateral hip pain.  She also reports a subjective fever that she was told to by EMS that she had this morning however afebrile on arrival.  No prior intubations due to asthma exacerbations.  Denies any history of blood clots.  No chest pain, leg swelling, orthopnea. No prior HX of CHF.    The history is provided by the patient.  Shortness of Breath Severity:  Moderate Onset quality:  Gradual Duration:  4 days Timing:  Constant Progression:  Worsening Chronicity:  Recurrent Context: URI   Relieved by:  Nothing Worsened by:  Movement and deep breathing Ineffective treatments:  Rest, inhaler and NSAIDs Associated symptoms: cough, sore throat, sputum production and wheezing   Associated symptoms: no abdominal pain, no chest pain, no diaphoresis, no fever and no hemoptysis    Risk factors: no family hx of DVT, no hx of PE/DVT and no tobacco use       Past Medical History:  Diagnosis Date   Bloody stool    Dental decay    Gastritis, Helicobacter pylori 81/02/2992   Hypertension    Pneumonia 06/2015   Prepyloric ulcer 06/22/2015   Seasonal allergies    Shortness of breath dyspnea    with exertion    Patient Active Problem List   Diagnosis Date Noted   Asthma exacerbation 03/18/2021   Chronic left hip pain 03/12/2021   Chronic left shoulder pain 03/12/2021   Mild intermittent asthma without complication 71/69/6789   Anxiety with depression 10/06/2017   Healthcare maintenance 10/06/2017   Arthritis of left knee 10/06/2017   Alcohol use disorder 07/06/2015   Prepyloric ulcer 06/22/2015   Elevated blood pressure reading with diagnosis of hypertension 04/14/2015   Seasonal allergies 04/14/2015    Past Surgical History:  Procedure Laterality Date   BARTHOLIN CYST MARSUPIALIZATION Left 1997   CARDIAC SURGERY N/A 1963   Repair of PFO   COLONOSCOPY N/A 2002   Performed secondary to mother's diagnosis of colon CA at age 48   ESOPHAGOGASTRODUODENOSCOPY (EGD) WITH PROPOFOL N/A 06/22/2015   Procedure: ESOPHAGOGASTRODUODENOSCOPY (EGD) WITH PROPOFOL;  Surgeon: Arta Silence, MD;  Location: [facility] ENDOSCOPY;  Service: Endoscopy;  Laterality: N/A;   FRACTURE SURGERY     NASAL SINUS SURGERY Bilateral 1984   OVARIAN CYST REMOVAL Left 1990  TUBAL LIGATION     UMBILICAL HERNIA REPAIR N/A 2005     OB History   No obstetric history on file.     Family History  Problem Relation Age of Onset   Colon cancer Mother 42   Hypertension Mother    Hypertension Father    Cerebral aneurysm Sister    Goiter Sister     Social History   Tobacco Use   Smoking status: Never   Smokeless tobacco: Never  Substance Use Topics   Alcohol use: Yes    Alcohol/week: 0.0 standard drinks    Comment: 48 oz malt liquor when drinks, but not daily.  Difficult to pin down; last  drink was 06/17/15   Drug use: No    Home Medications Prior to Admission medications   Medication Sig Start Date End Date Taking? Authorizing Provider  albuterol (VENTOLIN HFA) 108 (90 Base) MCG/ACT inhaler INHALE 2 PUFFS INTO THE LUNGS EVERY 6 (SIX) HOURS AS NEEDED FOR WHEEZING OR SHORTNESS OF BREATH. 04/17/20  Yes Kerin Perna, NP  amLODipine (NORVASC) 10 MG tablet Take 1 tablet (10 mg total) by mouth daily. 03/11/21  Yes Mayers, Cari S, PA-C  chlorthalidone (HYGROTON) 50 MG tablet Take 1 tablet (50 mg total) by mouth daily. 03/11/21  Yes Mayers, Cari S, PA-C  cholecalciferol (VITAMIN D3) 25 MCG (1000 UNIT) tablet Take 1,000 Units by mouth daily.   Yes [provider]  dextromethorphan-guaiFENesin (MUCINEX DM) 30-600 MG 12hr tablet Take 1 tablet by mouth 2 (two) times daily as needed for cough.   Yes [provider]  diclofenac Sodium (VOLTAREN) 1 % GEL APPLY 4 GRAM(S) TOPICALLY TWICE DAILY AS NEEDED Patient taking differently: Apply 4 g topically 2 (two) times daily as needed (pain). 11/27/20  Yes Kerin Perna, NP  FLUoxetine (PROZAC) 20 MG capsule Take 1 capsule (20 mg total) by mouth daily. 03/11/21  Yes Mayers, Cari S, PA-C  hydrOXYzine (ATARAX/VISTARIL) 25 MG tablet TAKE 1 TABLET (25 MG TOTAL) BY MOUTH EVERY 8 (EIGHT) HOURS AS NEEDED. Patient taking differently: Take 25 mg by mouth every 8 (eight) hours as needed for anxiety. 11/05/20  Yes Kerin Perna, NP  ibuprofen (ADVIL) 800 MG tablet Take 1 tablet (800 mg total) by mouth every 8 (eight) hours as needed for moderate pain. 03/11/21  Yes Mayers, Cari S, PA-C  loratadine (CLARITIN) 10 MG tablet Take 1 tablet (10 mg total) by mouth daily. Patient taking differently: Take 10 mg by mouth daily as needed for allergies. 12/14/19  Yes Kerin Perna, NP  SYMBICORT 160-4.5 MCG/ACT inhaler Inhale 2 puffs into the lungs 2 (two) times daily. 03/11/21  Yes Mayers, Cari S, PA-C  traZODone (DESYREL) 50 MG tablet  Take 1 tablet (50 mg total) by mouth at bedtime. 03/11/21  Yes Mayers, Cari S, PA-C  Blood Pressure Monitor KIT 1 kit by Does not apply route 3 (three) times daily as needed. 09/01/19   Kerin Perna, NP  cyclobenzaprine (FLEXERIL) 10 MG tablet TAKE 1 TABLET (10 MG TOTAL) BY MOUTH 3 (THREE) TIMES DAILY AS NEEDED FOR MUSCLE SPASMS. DO NOT DRINK ALCOHOL OR DRIVE WHILE TAKING THIS MEDICATION Patient not taking: No sig reported 07/11/20   Charlott Rakes, MD    Allergies    Tetracyclines & related  Review of Systems   Review of Systems  Constitutional:  Negative for diaphoresis and fever.  HENT:  Positive for sore throat.   Respiratory:  Positive for cough, sputum production, shortness of breath  and wheezing. Negative for hemoptysis.   Cardiovascular:  Negative for chest pain.  Gastrointestinal:  Negative for abdominal pain and nausea.  Genitourinary:  Negative for flank pain.  Musculoskeletal:  Negative for back pain.  Neurological:  Negative for light-headedness.  All other systems reviewed and are negative.  Physical Exam Updated Vital Signs BP (!) 146/84   Pulse (!) 107   Temp 97.9 F (36.6 C) (Oral)   Resp 18   Ht $R'5\' 3"'Lg$  (1.6 m)   Wt 108.9 kg   SpO2 99%   BMI 42.51 kg/m   Physical Exam Vitals and nursing note reviewed.  Constitutional:      Appearance: She is well-developed. She is ill-appearing.  HENT:     Head: Normocephalic and atraumatic.     Mouth/Throat:     Mouth: Mucous membranes are moist.     Pharynx: Oropharynx is clear. No oropharyngeal exudate.     Comments: Oropharynx is clear, with minimal erythema noted but no tonsillar exudate or PTA. Cardiovascular:     Rate and Rhythm: Tachycardia present.     Heart sounds: No murmur heard.    Comments: No BL pitting edema or calf tenderness noted.  Pulmonary:     Effort: Tachypnea present.     Breath sounds: Examination of the right-upper field reveals wheezing. Examination of the left-upper field reveals  wheezing. Examination of the right-middle field reveals wheezing. Examination of the right-lower field reveals wheezing. Examination of the left-lower field reveals wheezing. Wheezing present.     Comments: Audible expiratory wheezing on exam.  Chest:     Chest wall: No tenderness.  Abdominal:     Palpations: Abdomen is soft.  Musculoskeletal:     Cervical back: Normal range of motion.     Right lower leg: No tenderness. No edema.     Left lower leg: No tenderness. No edema.  Lymphadenopathy:     Cervical: Cervical adenopathy present.  Skin:    General: Skin is dry.  Neurological:     Mental Status: She is alert and oriented to person, place, and time.    ED Results / Procedures / Treatments   Labs (all labs ordered are listed, but only abnormal results are displayed) Labs Reviewed  CBC WITH DIFFERENTIAL/PLATELET - Abnormal; Notable for the following components:      Result Value   RBC 3.50 (*)    Hemoglobin 10.3 (*)    HCT 32.4 (*)    All other components within normal limits  COMPREHENSIVE METABOLIC PANEL - Abnormal; Notable for the following components:   Potassium 2.6 (*)    Glucose, Bld 196 (*)    All other components within normal limits  RESP PANEL BY RT-PCR (FLU A&B, COVID) ARPGX2  BRAIN NATRIURETIC PEPTIDE  URINALYSIS, ROUTINE W REFLEX MICROSCOPIC  BLOOD GAS, VENOUS  HIV ANTIBODY (ROUTINE TESTING W REFLEX)    EKG None  Radiology DG Chest 1 View  Result Date: 03/18/2021 CLINICAL DATA:  Shortness of breath. EXAM: CHEST  1 VIEW COMPARISON:  01/01/2011 FINDINGS: 0651 hours. The cardio pericardial silhouette is enlarged. Left base atelectasis or infiltrate. Right lung clear. The visualized bony structures of the thorax show no acute abnormality. IMPRESSION: Cardiomegaly with left base atelectasis or infiltrate. Dedicated PA and lateral upright chest x-ray recommended, when patient is able. Electronically Signed   By: Misty Stanley M.D.   On: 03/18/2021 07:34     Procedures Procedures   Medications Ordered in ED Medications  enoxaparin (LOVENOX) injection 40 mg (has  no administration in time range)  acetaminophen (TYLENOL) tablet 650 mg (has no administration in time range)    Or  acetaminophen (TYLENOL) suppository 650 mg (has no administration in time range)  HYDROcodone-acetaminophen (NORCO/VICODIN) 5-325 MG per tablet 1-2 tablet (has no administration in time range)  morphine 2 MG/ML injection 2 mg (has no administration in time range)  senna-docusate (Senokot-S) tablet 1 tablet (has no administration in time range)  bisacodyl (DULCOLAX) EC tablet 5 mg (has no administration in time range)  ondansetron (ZOFRAN) tablet 4 mg (has no administration in time range)    Or  ondansetron (ZOFRAN) injection 4 mg (has no administration in time range)  metoprolol tartrate (LOPRESSOR) injection 5 mg (has no administration in time range)  albuterol (PROVENTIL) (2.5 MG/3ML) 0.083% nebulizer solution 2.5 mg (has no administration in time range)  potassium chloride 10 mEq in 100 mL IVPB (10 mEq Intravenous New Bag/Given 03/18/21 0819)  potassium chloride (KLOR-CON) packet 40 mEq (40 mEq Oral Given 03/18/21 0819)  ipratropium-albuterol (DUONEB) 0.5-2.5 (3) MG/3ML nebulizer solution 6 mL (6 mLs Nebulization Given 03/18/21 1001)    ED Course  I have reviewed the triage vital signs and the nursing notes.  Pertinent labs & imaging results that were available during my care of the patient were reviewed by me and considered in my medical decision making (see chart for details).  Clinical Course as of 03/18/21 1109  Mon Mar 18, 2021  0805 Potassium(!!): 2.6 IV replacement has been ordered [JS]    Clinical Course User Index [JS] Janeece Fitting, PA-C   MDM Rules/Calculators/A&P  Patient with a past medical history of hypertension, asthma presents to the ED via EMS with a chief complaint of shortness of breath which has been ongoing for the past 4 days.   Originally began feeling ill approximately 2 weeks ago, attribute this to bilateral OA hip pain.  Has been taking ibuprofen without much improvement in her symptoms.  Also reports trying to take some albuterol, prednisone without much improvement.  However, she reports she was using her albuterol instead of her prednisone.  The symptoms are exacerbated with movement, any sort of activity.  She does not have any orthopnea, no prior history of CHF.  On arrival her vitals are remarkable for tachycardia with a heart rate in 120s, she is afebrile and satting at 89% on room air.  Placed by nursing staff on 2 L via nasal cannula to bring her oxygen saturation 200%.  During my evaluation she is overall ill-appearing, has some cervical lymphadenopathy, there is audible expiratory wheezing noted on my exam.  Abdomen is soft and nontender to palpation.  Bilateral legs with out any pitting edema, no calf tenderness.  Chest x-ray along with COVID-19 testing has been ordered.  Also endorses some weakness, will also check blood work, chest x-ray to rule out any underlying pneumonia versus viral infection.  CBC without any leukocytosis, hemoglobin is slightly decreased, however no prior records aside from 1 year ago for accurate comparison.  CMP remarkable for hypokalemia at 2.6, IV potassium along with oral have been ordered for patient as tolerable.  The rest of her electrolytes are within normal limits.  Her kidney function is unremarkable.  LFTs are within normal limits.  Chest x-ray showed: Cardiomegaly with left base atelectasis or infiltrate. Dedicated PA  and lateral upright chest x-ray recommended, when patient is able.         She is currently pending BNP along with respiratory panel.  Patient reassessed  by me, reports no improvement in symptoms after breathing treatment with ipratropium.  COVID, influenza A, influenza B are all negative.  BNP is also normal at this time.  No suspicion for asthma  exacerbation, she is unable to be weaned off O2.  Will need to please call for hospitalist admission.  11:09 AM spoke to hospitalist service, who will evaluate patient along with admitted for further management.  She remains hemodynamically stable.  Portions of this note were generated with [employer]. Dictation errors may occur despite best attempts at proofreading.  Final Clinical Impression(s) / ED Diagnoses Final diagnoses:  Moderate persistent asthma with exacerbation  Hypokalemia    Rx / DC Orders ED Discharge Orders     None        Janeece Fitting, Hershal Coria 03/18/21 1109    Dorie Rank, MD 03/19/21 1446

## 2021-03-18 NOTE — H&P (Signed)
History and Physical    [name] [name] ZGY:[id number] DOB: [birth year] DOA: 03/18/2021  PCP: Kerin Perna, NP  Patient coming from: Home  I have personally briefly reviewed patient's old medical records in [facility].  Chief Complaint: shortness of breath  HPI: [name] [name] is a 60 y.o. female with medical history significant for mild intermittent asthma, allergies, alcohol use, who presents to the emergency department on 03/18/2021 with shortness of breath and other issues. On approximately 03/04/21, she started having a sore throat. The next week she had muscle and joint pains all over. On 03/15/21, she started having wheezing and shortness of breath. She took an in-home Covid test that was negative on 03/16/21. Shortness of breath and wheezing continued to worsen to the point that she was short of breath even with sitting. She felt she could not breathe anymore. Symptoms are alleviated by nothing and exacerbated by any exertion. EMS was called and brought her to the emergency department. Associated symptoms: Wheezing and coughing. Sore throat. Chills but no fever. Palpitations but no chest pain. Muscle and joint pain is located throughout her body and does not radiate; it is up to 8/10 at times and is characterized as aching. No vomiting, diarrhea, abdominal pain, or bloody stool. Constipation; last BM was more than several days prior to admission.  She reports that she developed a lump under her left arm and on her left thigh with both areas painful; she is not sure exactly when they started, but was prior to 03/12/2021, when her doctor ordered x-rays of these areas.  Treatments: She was using her albuterol frequently but was not getting any relief.  She does not wear oxygen at home.  ED Course: Patient received albuterol treatment and methylprednisolone 125 mg IV x1 by EMS.  Oxygen saturation 89% on room air.  Labs include potassium of 2.6, glucose 196.  SARS-CoV-2 PCR testing is negative.   She received IV and oral potassium replacement.  She was given albuterol and was placed on oxygen.  Review of Systems: As per HPI otherwise all other systems reviewed and are unremarable.  No dysuria or hematuria.   Past Medical History:  Diagnosis Date   Bloody stool    Dental decay    Gastritis, Helicobacter pylori 59/07/6382   Hypertension    Pneumonia 06/2015   Prepyloric ulcer 06/22/2015   Seasonal allergies    Shortness of breath dyspnea    with exertion    Past Surgical History:  Procedure Laterality Date   BARTHOLIN CYST MARSUPIALIZATION Left 1997   CARDIAC SURGERY N/A 1963   Repair of PFO   COLONOSCOPY N/A 2002   Performed secondary to mother's diagnosis of colon CA at age 3   ESOPHAGOGASTRODUODENOSCOPY (EGD) WITH PROPOFOL N/A 06/22/2015   Procedure: ESOPHAGOGASTRODUODENOSCOPY (EGD) WITH PROPOFOL;  Surgeon: Arta Silence, MD;  Location: [facility];  Service: Endoscopy;  Laterality: N/A;   FRACTURE SURGERY     NASAL SINUS SURGERY Bilateral 1984   OVARIAN CYST REMOVAL Left 1990   TUBAL LIGATION     UMBILICAL HERNIA REPAIR N/A 2005    Social History  reports that she has never smoked. She has never used smokeless tobacco. She reports current alcohol use. She reports that she does not use drugs.  Comment: 48 oz malt liquor when drinks, drinks most days.  Difficult to pin down; last drink was 03/13/21.    Allergies  Allergen Reactions   Tetracyclines & Related Itching    Family History  Problem Relation Age of Onset   Colon cancer Mother 58   Hypertension Mother    Hypertension Father    Cerebral aneurysm Sister    Goiter Sister      Home Medications  Prior to Admission medications   Medication Sig Start Date End Date Taking? Authorizing Provider  albuterol (VENTOLIN HFA) 108 (90 Base) MCG/ACT inhaler INHALE 2 PUFFS INTO THE LUNGS EVERY 6 (SIX) HOURS AS NEEDED FOR WHEEZING OR SHORTNESS OF BREATH. 04/17/20  Yes Kerin Perna, NP  amLODipine  (NORVASC) 10 MG tablet Take 1 tablet (10 mg total) by mouth daily. 03/11/21  Yes Mayers, Cari S, PA-C  chlorthalidone (HYGROTON) 50 MG tablet Take 1 tablet (50 mg total) by mouth daily. 03/11/21  Yes Mayers, Cari S, PA-C  cholecalciferol (VITAMIN D3) 25 MCG (1000 UNIT) tablet Take 1,000 Units by mouth daily.   Yes [provider]  dextromethorphan-guaiFENesin (MUCINEX DM) 30-600 MG 12hr tablet Take 1 tablet by mouth 2 (two) times daily as needed for cough.   Yes [provider]  diclofenac Sodium (VOLTAREN) 1 % GEL APPLY 4 GRAM(S) TOPICALLY TWICE DAILY AS NEEDED Patient taking differently: Apply 4 g topically 2 (two) times daily as needed (pain). 11/27/20  Yes Kerin Perna, NP  FLUoxetine (PROZAC) 20 MG capsule Take 1 capsule (20 mg total) by mouth daily. 03/11/21  Yes Mayers, Cari S, PA-C  hydrOXYzine (ATARAX/VISTARIL) 25 MG tablet TAKE 1 TABLET (25 MG TOTAL) BY MOUTH EVERY 8 (EIGHT) HOURS AS NEEDED. Patient taking differently: Take 25 mg by mouth every 8 (eight) hours as needed for anxiety. 11/05/20  Yes Kerin Perna, NP  ibuprofen (ADVIL) 800 MG tablet Take 1 tablet (800 mg total) by mouth every 8 (eight) hours as needed for moderate pain. 03/11/21  Yes Mayers, Cari S, PA-C  loratadine (CLARITIN) 10 MG tablet Take 1 tablet (10 mg total) by mouth daily. Patient taking differently: Take 10 mg by mouth daily as needed for allergies. 12/14/19  Yes Kerin Perna, NP  SYMBICORT 160-4.5 MCG/ACT inhaler Inhale 2 puffs into the lungs 2 (two) times daily. 03/11/21  Yes Mayers, Cari S, PA-C  traZODone (DESYREL) 50 MG tablet Take 1 tablet (50 mg total) by mouth at bedtime. 03/11/21  Yes Mayers, Cari S, PA-C  Blood Pressure Monitor KIT 1 kit by Does not apply route 3 (three) times daily as needed. 09/01/19   Kerin Perna, NP  cyclobenzaprine (FLEXERIL) 10 MG tablet TAKE 1 TABLET (10 MG TOTAL) BY MOUTH 3 (THREE) TIMES DAILY AS NEEDED FOR MUSCLE SPASMS. DO NOT DRINK ALCOHOL OR  DRIVE WHILE TAKING THIS MEDICATION Patient not taking: No sig reported 07/11/20   Charlott Rakes, MD    Physical Exam: Vitals:   03/18/21 0845 03/18/21 0900 03/18/21 0915 03/18/21 0930  BP: (!) 141/80 138/79 135/75 (!) 146/84  Pulse: (!) 102 (!) 106 (!) 108 (!) 107  Resp: (!) _0 Temp:      TempSrc:      SpO2: 100% 100% 96% 99%  Weight:      Height:        Constitutional: calm, morbidly obese, ill-appearing. Vitals:   03/18/21 0845 03/18/21 0900 03/18/21 0915 03/18/21 0930  BP: (!) 141/80 138/79 135/75 (!) 146/84  Pulse: (!) 102 (!) 106 (!) 108 (!) 107  Resp: (!) _1 Temp:      TempSrc:      SpO2: 100% 100% 96% 99%  Weight:  Height:       Eyes: Pupils equal and round, lids and conjunctivae without icterus or erythema. ENMT: Mucous membranes are dry. Posterior pharynx clear of any exudate or lesions. Nares patent without discharge or bleeding.  Normocephalic, atraumatic.  Normal dentition.  Neck: normal, supple, no masses, trachea midline.  Thyroid nontender, no masses appreciated, no thyromegaly. Respiratory: clear to auscultation bilaterally. Chest wall movements are symmetric. No crackles.  No rhonchi. No accessory muscle use.  Intermittent tachypnea.  Scattered wheezing.  Decreased breath sounds in the bases. Cardiovascular: Regular rate and rhythm, no murmurs / rubs / gallops. Pulses: DP pulses 2+ bilaterally. No carotid bruits.  Capillary refill less than 3 seconds. Edema: None bilaterally. GI: soft, non-distended, normal active bowel sounds. No hepatosplenomegaly. No rigidity, rebound, or guarding. Non-tender. No masses palpated.  Musculoskeletal: no clubbing / cyanosis. No joint deformity upper and lower extremities. Good ROM, no contractures. Normal muscle tone.  No deformity in the back bilaterally.  Mild lower back tenderness.  Tenderness to palpation of the lower left buttock and left hip area. Integument: no rashes, lesions, ulcers. No  induration. Clean, dry, intact. Neurologic: CN 2-12 grossly intact. Sensation grossly intact to light touch. DTR 2+ bilaterally.  Babinski: Toes downgoing bilaterally.  Strength 5/5 in all 4.  Intact rapid alternating movements bilaterally.  No pronator drift. Psychiatric: Normal judgment and insight. Alert and oriented x 3. Normal mood.  Normal and appropriate affect. Lymphatic: No cervical lymphadenopathy. No supraclavicular lymphadenopathy.  Left axilla with approximately 1 cm enlarged lymph node that is mildly tender.  No lump or mass appreciated in the left hip area that patient states she felt a mass.   Labs on Admission: I have personally reviewed the following labs and imaging studies.  CBC: Recent Labs  Lab 03/18/21 0639  WBC 7.6  NEUTROABS 6.2  HGB 10.3*  HCT 32.4*  MCV 92.6  PLT 219    Basic Metabolic Panel: Recent Labs  Lab 03/18/21 0639  NA 140  K 2.6*  CL 98  CO2 32  GLUCOSE 196*  BUN 8  CREATININE 0.56  CALCIUM 9.2    GFR: Estimated Creatinine Clearance: 88.5 mL/min (by C-G formula based on SCr of 0.56 mg/dL).  Liver Function Tests: Recent Labs  Lab 03/18/21 0639  AST 17  ALT 18  ALKPHOS 57  BILITOT 0.5  PROT 7.4  ALBUMIN 3.8    Urine analysis:   Radiological Exams on Admission:   Viewed personally.  DG Chest 1 View  Result Date: 03/18/2021 CLINICAL DATA:  Shortness of breath. EXAM: CHEST  1 VIEW COMPARISON:  01/01/2011 FINDINGS: 0651 hours. The cardio pericardial silhouette is enlarged. Left base atelectasis or infiltrate. Right lung clear. The visualized bony structures of the thorax show no acute abnormality. IMPRESSION: Cardiomegaly with left base atelectasis or infiltrate. Dedicated PA and lateral upright chest x-ray recommended, when patient is able. Electronically Signed   By: Misty Stanley M.D.   On: 03/18/2021 07:34    Assessment/Plan Principal Problem:   Mild intermittent asthma with (acute) exacerbation Active Problems:    Hypoxia   Seasonal allergies   Alcohol use disorder   Anxiety with depression   Osteoarthritis, multiple sites   Lump of axilla, left    Principal Problem:   Mild intermittent asthma with (acute) exacerbation Plan: Albuterol.  IV methylprednisolone.  Oxygen by nasal cannula and increase as needed.  Active Problems:   Hypoxia No use of oxygen at home.  Likely due to asthma exacerbation.  Chest x-ray shows left lower lobe atelectasis versus infiltrate; suspect this is atelectasis.  Plan: Oxygen by nasal cannula and increase as needed.  Chest x-ray PA and lateral ordered for 03/19/2021.    Seasonal allergies Plan: Outpatient follow-up.    Alcohol use disorder Patient reports last use was over a week ago, so withdrawal is considered less likely.  Plan: Detox protocol.  Start patient on prn Ativan for breakthrough symptoms.  Labs ordered.  Give oral thiamine/folate/MVI. Patient advised to stop drinking but to do so under medical supervision because of DTs risk. Patient advised to take thiamine and multivitamin daily after discharge.     Anxiety with depression Plan: Continue home medications and outpatient follow-up.    Osteoarthritis, multiple sites Plan: Continue home medications and outpatient follow-up.    Lump of axilla, left Identified outpatient and had imaging 03/12/2021. Plan: Advised patient to follow-up with primary care.     DVT prophylaxis: Lovenox.  Code Status:   Full Code   Disposition Plan:   Patient is from:  Home  Anticipated DC to:  Home  Anticipated DC date:  03/18/2021  Anticipated DC barriers: Need for oxygen support  Consults called:  None Admission status:  Observation  Severity of Illness: The appropriate patient status for this patient is OBSERVATION. Observation status is judged to be reasonable and necessary in order to provide the required intensity of service to ensure the patient's safety. The patient's presenting symptoms, physical exam  findings, and initial radiographic and laboratory data in the context of their medical condition is felt to place them at decreased risk for further clinical deterioration. Furthermore, it is anticipated that the patient will be medically stable for discharge from the hospital within 2 midnights of admission. The following factors support the patient status of observation.   " The patient's presenting symptoms include shortness of breath. " The physical exam findings include decreased breath sounds. " The initial radiographic and laboratory data are chest x-ray with left lower atelectasis versus infiltrate.  Oxygen level 89% on room air.    Tacey Ruiz MD [facility]  How to contact the [facility] Attending or Consulting provider [place] or covering provider during after hours [place], for this patient?   Check the care team in [facility] and look for a) attending/consulting [facility] provider listed and b) the [facility] team listed Log into [email or web address] and use [place]'s universal password to access. If you do not have the password, please contact the hospital operator. Locate the [facility] provider you are looking for under [facility] and page to a number that you can be directly reached. If you still have difficulty reaching the provider, please page the [facility] (Director on Call) for the Hospitalists listed on amion for assistance.  03/18/2021, 1:11 PM

## 2021-03-18 NOTE — ED Notes (Signed)
CRITICAL VALUE STICKER  CRITICAL VALUE:K+ 2.6  RECEIVER (on-site recipient of call):I. Cash Duce  DATE & TIME NOTIFIED: 03/18/21 0802  MESSENGER (representative from lab):Liz  MD NOTIFIED: Tomi Bamberger  TIME OF NOTIFICATION: 03/18/21 [id number]

## 2021-03-18 NOTE — ED Notes (Addendum)
Pt refuses HIV testing, MD notified

## 2021-03-18 NOTE — ED Triage Notes (Signed)
Patient is A&O x4 ambulatory with assistance due to weakness. Patient started having SOB Saturday morning.  Hx: asthma. Reports using inhaler with no relief. Sore throat, body aches, cough, and fever. 20LAC 125 solumedrol

## 2021-03-19 ENCOUNTER — Inpatient Hospital Stay (HOSPITAL_COMMUNITY): Payer: Medicare Other

## 2021-03-19 ENCOUNTER — Observation Stay (HOSPITAL_COMMUNITY): Payer: Medicare Other

## 2021-03-19 DIAGNOSIS — I110 Hypertensive heart disease with heart failure: Secondary | ICD-10-CM | POA: Diagnosis present

## 2021-03-19 DIAGNOSIS — B9620 Unspecified Escherichia coli [E. coli] as the cause of diseases classified elsewhere: Secondary | ICD-10-CM | POA: Diagnosis not present

## 2021-03-19 DIAGNOSIS — F419 Anxiety disorder, unspecified: Secondary | ICD-10-CM | POA: Diagnosis present

## 2021-03-19 DIAGNOSIS — F1010 Alcohol abuse, uncomplicated: Secondary | ICD-10-CM

## 2021-03-19 DIAGNOSIS — E662 Morbid (severe) obesity with alveolar hypoventilation: Secondary | ICD-10-CM | POA: Diagnosis present

## 2021-03-19 DIAGNOSIS — F32A Depression, unspecified: Secondary | ICD-10-CM | POA: Diagnosis present

## 2021-03-19 DIAGNOSIS — J4521 Mild intermittent asthma with (acute) exacerbation: Secondary | ICD-10-CM | POA: Diagnosis not present

## 2021-03-19 DIAGNOSIS — M160 Bilateral primary osteoarthritis of hip: Secondary | ICD-10-CM | POA: Diagnosis present

## 2021-03-19 DIAGNOSIS — R0602 Shortness of breath: Secondary | ICD-10-CM | POA: Diagnosis present

## 2021-03-19 DIAGNOSIS — Z800 Family history of malignant neoplasm of digestive organs: Secondary | ICD-10-CM | POA: Diagnosis not present

## 2021-03-19 DIAGNOSIS — J9600 Acute respiratory failure, unspecified whether with hypoxia or hypercapnia: Secondary | ICD-10-CM | POA: Diagnosis present

## 2021-03-19 DIAGNOSIS — J9622 Acute and chronic respiratory failure with hypercapnia: Secondary | ICD-10-CM | POA: Diagnosis present

## 2021-03-19 DIAGNOSIS — J4541 Moderate persistent asthma with (acute) exacerbation: Secondary | ICD-10-CM | POA: Diagnosis present

## 2021-03-19 DIAGNOSIS — Z881 Allergy status to other antibiotic agents status: Secondary | ICD-10-CM | POA: Diagnosis not present

## 2021-03-19 DIAGNOSIS — Z7951 Long term (current) use of inhaled steroids: Secondary | ICD-10-CM | POA: Diagnosis not present

## 2021-03-19 DIAGNOSIS — K5900 Constipation, unspecified: Secondary | ICD-10-CM | POA: Diagnosis present

## 2021-03-19 DIAGNOSIS — J9621 Acute and chronic respiratory failure with hypoxia: Secondary | ICD-10-CM | POA: Diagnosis present

## 2021-03-19 DIAGNOSIS — Z79899 Other long term (current) drug therapy: Secondary | ICD-10-CM | POA: Diagnosis not present

## 2021-03-19 DIAGNOSIS — I5033 Acute on chronic diastolic (congestive) heart failure: Secondary | ICD-10-CM | POA: Diagnosis present

## 2021-03-19 DIAGNOSIS — N390 Urinary tract infection, site not specified: Secondary | ICD-10-CM | POA: Diagnosis not present

## 2021-03-19 DIAGNOSIS — F418 Other specified anxiety disorders: Secondary | ICD-10-CM | POA: Diagnosis not present

## 2021-03-19 DIAGNOSIS — Z8249 Family history of ischemic heart disease and other diseases of the circulatory system: Secondary | ICD-10-CM | POA: Diagnosis not present

## 2021-03-19 DIAGNOSIS — E876 Hypokalemia: Secondary | ICD-10-CM | POA: Diagnosis present

## 2021-03-19 DIAGNOSIS — Z6841 Body Mass Index (BMI) 40.0 and over, adult: Secondary | ICD-10-CM | POA: Diagnosis not present

## 2021-03-19 DIAGNOSIS — Z20822 Contact with and (suspected) exposure to covid-19: Secondary | ICD-10-CM | POA: Diagnosis present

## 2021-03-19 LAB — BASIC METABOLIC PANEL
Anion gap: 7 (ref 5–15)
BUN: 13 mg/dL (ref 6–20)
CO2: 32 mmol/L (ref 22–32)
Calcium: 9.5 mg/dL (ref 8.9–10.3)
Chloride: 98 mmol/L (ref 98–111)
Creatinine, Ser: 0.56 mg/dL (ref 0.44–1.00)
GFR, Estimated: >60 mL/min (ref >60)
Glucose, Bld: 208 mg/dL — ABNORMAL HIGH (ref 70–99)
Potassium: 4.4 mmol/L (ref 3.5–5.1)
Sodium: 137 mmol/L (ref 135–145)

## 2021-03-19 LAB — BLOOD GAS, ARTERIAL
Acid-Base Excess: 5 mmol/L — ABNORMAL HIGH (ref 0.0–2.0)
Acid-Base Excess: 4.9 mmol/L — ABNORMAL HIGH (ref 0.0–2.0)
Acid-Base Excess: 9.3 mmol/L — ABNORMAL HIGH (ref 0.0–2.0)
Bicarbonate: 36.8 mmol/L — ABNORMAL HIGH (ref 20.0–28.0)
Bicarbonate: 33.7 mmol/L — ABNORMAL HIGH (ref 20.0–28.0)
Bicarbonate: 36.3 mmol/L — ABNORMAL HIGH (ref 20.0–28.0)
Drawn by: 22052
Drawn by: 22052
Expiratory PAP: 8
Expiratory PAP: 8
FIO2: 35
FIO2: 35
Inspiratory PAP: 16
Inspiratory PAP: 16
Mode: POSITIVE
Mode: POSITIVE
O2 Saturation: 92 %
O2 Saturation: 89.1 %
O2 Saturation: 95.7 %
Patient temperature: 98.6
Patient temperature: 98.6
Patient temperature: 98.1
RATE: 16 resp/min
pCO2 arterial: 109 mmHg (ref 32.0–48.0)
pCO2 arterial: 77.1 mmHg (ref 32.0–48.0)
pCO2 arterial: 65.3 mmHg (ref 32.0–48.0)
pH, Arterial: 7.152 — CL (ref 7.350–7.450)
pH, Arterial: 7.263 — ABNORMAL LOW (ref 7.350–7.450)
pH, Arterial: 7.364 (ref 7.350–7.450)
pO2, Arterial: 80.6 mmHg — ABNORMAL LOW (ref 83.0–108.0)
pO2, Arterial: 62.8 mmHg — ABNORMAL LOW (ref 83.0–108.0)
pO2, Arterial: 80 mmHg — ABNORMAL LOW (ref 83.0–108.0)

## 2021-03-19 LAB — URINALYSIS, ROUTINE W REFLEX MICROSCOPIC
Bilirubin Urine: NEGATIVE
Glucose, UA: NEGATIVE mg/dL
Hgb urine dipstick: NEGATIVE
Ketones, ur: NEGATIVE mg/dL
Leukocytes,Ua: NEGATIVE
Nitrite: NEGATIVE
Protein, ur: NEGATIVE mg/dL
Specific Gravity, Urine: 1.009 (ref 1.005–1.030)
pH: 5 (ref 5.0–8.0)

## 2021-03-19 LAB — CBC
HCT: 36 % (ref 36.0–46.0)
Hemoglobin: 11.2 g/dL — ABNORMAL LOW (ref 12.0–15.0)
MCH: 29.2 pg (ref 26.0–34.0)
MCHC: 31.1 g/dL (ref 30.0–36.0)
MCV: 94 fL (ref 80.0–100.0)
Platelets: 276 10*3/uL (ref 150–400)
RBC: 3.83 MIL/uL — ABNORMAL LOW (ref 3.87–5.11)
RDW: 13.2 % (ref 11.5–15.5)
WBC: 13.8 10*3/uL — ABNORMAL HIGH (ref 4.0–10.5)
nRBC: 0 % (ref 0.0–0.2)

## 2021-03-19 LAB — ECHOCARDIOGRAM COMPLETE
AR max vel: 1.83 cm2
AV Area VTI: 1.9 cm2
AV Area mean vel: 1.8 cm2
AV Mean grad: 6 mmHg
AV Peak grad: 10.6 mmHg
Ao pk vel: 1.63 m/s
Area-P 1/2: 5.64 cm2
Height: 63 in
P 1/2 time: 276 msec
S' Lateral: 3 cm
Single Plane A4C EF: 55.1 %
Weight: 3851.88 oz

## 2021-03-19 LAB — GLUCOSE, CAPILLARY: Glucose-Capillary: 200 mg/dL — ABNORMAL HIGH (ref 70–99)

## 2021-03-19 LAB — PROCALCITONIN: Procalcitonin: <0.1 ng/mL

## 2021-03-19 LAB — RAPID URINE DRUG SCREEN, HOSP PERFORMED
Amphetamines: NOT DETECTED
Barbiturates: NOT DETECTED
Benzodiazepines: NOT DETECTED
Cocaine: NOT DETECTED
Opiates: POSITIVE — AB
Tetrahydrocannabinol: NOT DETECTED

## 2021-03-19 LAB — POTASSIUM: Potassium: 3.8 mmol/L (ref 3.5–5.1)

## 2021-03-19 LAB — BRAIN NATRIURETIC PEPTIDE: B Natriuretic Peptide: 212.2 pg/mL — ABNORMAL HIGH (ref 0.0–100.0)

## 2021-03-19 LAB — MRSA NEXT GEN BY PCR, NASAL: MRSA by PCR Next Gen: NOT DETECTED

## 2021-03-19 LAB — AMMONIA: Ammonia: 88 umol/L — ABNORMAL HIGH (ref 9–35)

## 2021-03-19 MED ORDER — LABETALOL HCL 5 MG/ML IV SOLN
20.0000 mg | INTRAVENOUS | Status: DC | PRN
  Filled 2021-03-19: qty 4

## 2021-03-19 MED ORDER — URELLE 81 MG PO TABS
1.0000 | ORAL_TABLET | Freq: Three times a day (TID) | ORAL | Status: DC
  Administered 2021-03-20 – 2021-03-21 (×4): 81 mg via ORAL
  Filled 2021-03-19 (×6): qty 1

## 2021-03-19 MED ORDER — URELLE 81 MG PO TABS: 1.0000 | ORAL_TABLET | Freq: Three times a day (TID) | ORAL | Status: DC

## 2021-03-19 MED ORDER — SODIUM CHLORIDE 0.9 % IV SOLN
3.0000 g | Freq: Four times a day (QID) | INTRAVENOUS | Status: DC
  Administered 2021-03-19 – 2021-03-21 (×8): 3 g via INTRAVENOUS
  Filled 2021-03-19: qty 3
  Filled 2021-03-19: qty 8
  Filled 2021-03-19: qty 3
  Filled 2021-03-19 (×2): qty 8
  Filled 2021-03-19: qty 3
  Filled 2021-03-19 (×2): qty 8

## 2021-03-19 MED ORDER — NALOXONE HCL 0.4 MG/ML IJ SOLN
INTRAMUSCULAR | Status: AC
  Administered 2021-03-19: 0.4 mg
  Filled 2021-03-19: qty 1

## 2021-03-19 MED ORDER — BUDESONIDE 0.25 MG/2ML IN SUSP
0.2500 mg | Freq: Two times a day (BID) | RESPIRATORY_TRACT | Status: DC
  Administered 2021-03-19 – 2021-03-20 (×3): 0.25 mg via RESPIRATORY_TRACT
  Filled 2021-03-19 (×3): qty 2

## 2021-03-19 MED ORDER — IPRATROPIUM-ALBUTEROL 0.5-2.5 (3) MG/3ML IN SOLN
3.0000 mL | Freq: Four times a day (QID) | RESPIRATORY_TRACT | Status: DC
  Administered 2021-03-19 – 2021-03-21 (×7): 3 mL via RESPIRATORY_TRACT
  Filled 2021-03-19 (×8): qty 3

## 2021-03-19 MED ORDER — METHYLPREDNISOLONE SODIUM SUCC 125 MG IJ SOLR
125.0000 mg | Freq: Every day | INTRAMUSCULAR | Status: DC
  Administered 2021-03-19: 125 mg via INTRAVENOUS
  Filled 2021-03-19: qty 2

## 2021-03-19 MED ORDER — FUROSEMIDE 10 MG/ML IJ SOLN
40.0000 mg | Freq: Once | INTRAMUSCULAR | Status: AC
  Administered 2021-03-19: 40 mg via INTRAVENOUS
  Filled 2021-03-19: qty 4

## 2021-03-19 MED ORDER — CHLORHEXIDINE GLUCONATE CLOTH 2 % EX PADS
6.0000 | MEDICATED_PAD | Freq: Every day | CUTANEOUS | Status: DC
  Administered 2021-03-19 – 2021-03-25 (×6): 6 via TOPICAL

## 2021-03-19 MED ORDER — ALBUTEROL SULFATE (2.5 MG/3ML) 0.083% IN NEBU
2.5000 mg | INHALATION_SOLUTION | RESPIRATORY_TRACT | Status: DC | PRN
  Administered 2021-03-19 – 2021-03-24 (×2): 2.5 mg via RESPIRATORY_TRACT
  Filled 2021-03-19 (×2): qty 3

## 2021-03-19 MED ORDER — NALOXONE HCL 0.4 MG/ML IJ SOLN: 0.4000 mg | Freq: Once | INTRAMUSCULAR | Status: DC

## 2021-03-19 NOTE — Progress Notes (Signed)
Pt transferred to [place]. Pt daughter Clinical research associate) called via telephone by this RN.

## 2021-03-19 NOTE — Progress Notes (Signed)
Pharmacy Antibiotic Note  [name] [name] is a 60 y.o. female admitted on 03/18/2021 with  aspiration PNA  .  Pharmacy has been consulted for Unasyn dosing.  Plan: Unasyn 3 gr IV q6h   Height: '5\' 3"'$  (160 cm) Weight: 109.2 kg (240 lb 11.9 oz) IBW/kg (Calculated) : 52.4  Temp (24hrs), Avg:98.5 F (36.9 C), Min:97.7 F (36.5 C), Max:100.9 F (38.3 C)  Recent Labs  Lab 03/18/21 0639 03/18/21 1923 03/19/21 0526  WBC 7.6  --  13.8*  CREATININE 0.56 0.68 0.56    Estimated Creatinine Clearance: 88.7 mL/min (by C-G formula based on SCr of 0.56 mg/dL).    Allergies  Allergen Reactions   Tetracyclines & Related Itching    Antimicrobials this admission: 8/30 Unasyn >>    Dose adjustments this admission:   Microbiology results: 8/30 BCx:     Thank you for allowing pharmacy to be a part of this patient's care.  Royetta Asal, PharmD, BCPS 03/19/2021 12:37 PM

## 2021-03-19 NOTE — Progress Notes (Signed)
Patient was taken off BIPAP and placed on 2lpm [place]. She is tolerating well at this time with no distress. RT will continue to monitor

## 2021-03-19 NOTE — Progress Notes (Signed)
Pt O2 saturation 74% on 2L. Pt not easily aroused when sternum rubbed. Charge RN made aware. MD Rai paged @ [phone]. RR called by charged RN. Narcan was given @ 0800 per MD order (see MAR).

## 2021-03-19 NOTE — Significant Event (Signed)
Rapid Response Event Note   Reason for Call : desaturation to 71%, lethargy    Initial Focused Assessment: upon arrival, patient noted to be 80% on 6L hi flow nasal cannula, unresponsive to verbal stimuli, only responsive to sternal rub, painful stimuli. Quickly falling back to sleep after stimulation. Rhonchus in all lung fields, snoring at times, placed towel roll under patient's neck to protect her airway.       Interventions: Patient advanced to 15L NRB, pulled up in bed. Attempted narcan for possible overdose with no change in status after administration. chest xray, ammonia and BNP pulled, ABG showing CO2 109, pH 7.152, patient transferred down to SDU and placed on bipap    Plan of Care: continue bipap therapy, will recheck ABG 2 hours into bipap therapy and closely monitor on SDU     Event Summary:   MD Notified: Rai @ [phone]  Call Time: 0750 Arrival Time: [id number] End Time: 0825  Clarene Critchley, RN

## 2021-03-19 NOTE — Consult Note (Signed)
NAME:  [name] [name], MRN:  [id number], DOB:  [birth year], LOS: 0 ADMISSION DATE:  03/18/2021, CONSULTATION DATE:  03/19/21 REFERRING MD:  Tana Coast, MD CHIEF COMPLAINT:  Respiratory failure   History of Present Illness:  60 year old female with asthma admitted for asthma exacerbation and transferred to the ICU for altered mental status secondary to acute on chronic hypercarbic respiratory failure.  Patient unable to participate in interview due to obtunded status. History obtained per chart review. Two weeks ago, she had sore throat, muscle aches and joint pain followed five days later with shortness of breath and wheezing. Home covid-19 test negative. Shortness of breath and wheezing worsened. In the ED she was treated with albuterol and IV steroids. Found to be hypoxemic to 89% and started on supplemental oxygen. This morning on evaluation she had altered mental status. ABG consistent with acute on chronic hypercarbic respiratory failure. 7/152/109/80.6. CXR with increased vascular congestion compared to admission. Transferred to SDU to start BiPAP. PCCM consulted.  Pertinent  Medical History  Asthma, seasonal allergies, etoh abuse  Significant Hospital Events: Including procedures, antibiotic start and stop dates in addition to other pertinent events   8/29 Admitted to [facility] 8/30 Transferred to SDU for BiPAP for respiratory acidosis. PCCM consulted  Interim History / Subjective:  As above  Objective   Blood pressure (!) 145/90, pulse (!) 120, temperature 97.9 F (36.6 C), temperature source Oral, resp. rate 20, height _0  (1.6 m), weight 109.2 kg, SpO2 (!) 74 %.       No intake or output data in the 24 hours ending 03/19/21 0842 Filed Weights   03/18/21 1761 03/19/21 0553  Weight: 108.9 kg 109.2 kg    Physical Exam: General: Obese, critically ill-appearing HENT: [place], AT, BiPAP in place Eyes: EOMI, no scleral icterus Respiratory: Bilateral rhonchi, no notable wheezing Cardiovascular:  RRR, -M/R/G, no JVD GI: BS+, soft, nontender Extremities:-Edema,-tenderness Neuro: Lethargic, opens eyes to sternal rub, no withdrawal to noxious stimuli  ABG    Component Value Date/Time   PHART 7.152 (LL) 03/19/2021 0815   PCO2ART 109 (HH) 03/19/2021 0815   PO2ART 80.6 (L) 03/19/2021 0815   HCO3 36.8 (H) 03/19/2021 0815   TCO2 28 01/01/2011 1532   O2SAT 92.0 03/19/2021 0815   WBC 13.8, Hg 11.2  BMET reviewed and appropriate. Only glucose elevated  CXR 8/30 - Cardiomegaly. Increased vascular congestion. Left lower lobe atelectasis vs consolidation  Resolved Hospital Problem list     Assessment & Plan:   Asthma exacerbation Acute on chronic hypercarbic respiratory failure --BiPAP --Repeat ABG in 1 hour --Diurese x 1 --Continue scheduled Duonebs and PRN albuterol --Continue steroids --Minimize sedating medications --Obtain echocardiogram, BNP  EtOH abuse --CIWA --Folic acid, multivitamin  Best Practice (right click and "Reselect all SmartList Selections" daily)   Diet/type: NPO DVT prophylaxis: LMWH GI prophylaxis: N/A Lines: N/A Foley:  N/A Code Status:  full code Last date of multidisciplinary goals of care discussion _1   Labs   CBC: Recent Labs  Lab 03/18/21 0639 03/19/21 0526  WBC 7.6 13.8*  NEUTROABS 6.2  --   HGB 10.3* 11.2*  HCT 32.4* 36.0  MCV 92.6 94.0  PLT 193 607    Basic Metabolic Panel: Recent Labs  Lab 03/18/21 0639 03/18/21 1923 03/19/21 0526  NA 140 142 137  K 2.6* 3.7 4.4  CL 98 100 98  CO2 32 31 32  GLUCOSE 196* 187* 208*  BUN _2 CREATININE 0.56 0.68 0.56  CALCIUM 9.2  9.4 9.5  MG  --  1.7  --    GFR: Estimated Creatinine Clearance: 88.7 mL/min (by C-G formula based on SCr of 0.56 mg/dL). Recent Labs  Lab 03/18/21 0639 03/19/21 0526  WBC 7.6 13.8*    Liver Function Tests: Recent Labs  Lab 03/18/21 0639  AST 17  ALT 18  ALKPHOS 57  BILITOT 0.5  PROT 7.4  ALBUMIN 3.8   No results for input(s):  LIPASE, AMYLASE in the last 168 hours. No results for input(s): AMMONIA in the last 168 hours.  ABG    Component Value Date/Time   PHART 7.152 (LL) 03/19/2021 0815   PCO2ART 109 (HH) 03/19/2021 0815   PO2ART 80.6 (L) 03/19/2021 0815   HCO3 36.8 (H) 03/19/2021 0815   TCO2 28 01/01/2011 1532   O2SAT 92.0 03/19/2021 0815     Coagulation Profile: No results for input(s): INR, PROTIME in the last 168 hours.  Cardiac Enzymes: No results for input(s): CKTOTAL, CKMB, CKMBINDEX, TROPONINI in the last 168 hours.  HbA1C: Hgb A1c MFr Bld  Date/Time Value Ref Range Status  02/10/2019 10:45 AM 5.3 4.8 - 5.6 % Final    Comment:             Prediabetes: 5.7 - 6.4          Diabetes: >6.4          Glycemic control for adults with diabetes: <7.0   03/15/2010 04:47 AM  <5.7 % Final   5.3 (NOTE)                                                                       According to the ADA Clinical Practice Recommendations for 2011, when HbA1c is used as a screening test:   >=6.5%   Diagnostic of Diabetes Mellitus           (if abnormal result  is confirmed)  5.7-6.4%   Increased risk of developing Diabetes Mellitus  References:Diagnosis and Classification of Diabetes Mellitus,Diabetes DTOI,7124,58(KDXIP 1):S62-S69 and Standards of Medical Care in         Diabetes - 2011,Diabetes Care,2011,34  (Suppl 1):S11-S61.    CBG: Recent Labs  Lab 03/19/21 0755  GLUCAP 200*    Review of Systems:   Unable to obtain due to obtunded status  Past Medical History:  She,  has a past medical history of Asthma, Bloody stool, Dental decay, Gastritis, Helicobacter pylori (38/25/0539), Hypertension, Pneumonia (06/21/2015), Prepyloric ulcer (06/22/2015), Seasonal allergies, and Shortness of breath dyspnea.   Surgical History:   Past Surgical History:  Procedure Laterality Date   BARTHOLIN CYST MARSUPIALIZATION Left 1997   CARDIAC SURGERY N/A 1963   Repair of PFO   COLONOSCOPY N/A 2002   Performed secondary  to mother's diagnosis of colon CA at age 60   ESOPHAGOGASTRODUODENOSCOPY (EGD) WITH PROPOFOL N/A 06/22/2015   Procedure: ESOPHAGOGASTRODUODENOSCOPY (EGD) WITH PROPOFOL;  Surgeon: Arta Silence, MD;  Location: [facility] ENDOSCOPY;  Service: Endoscopy;  Laterality: N/A;   FRACTURE SURGERY     NASAL SINUS SURGERY Bilateral 1984   OVARIAN CYST REMOVAL Left 1990   TUBAL LIGATION     UMBILICAL HERNIA REPAIR N/A 2005     Social History:   reports that she has never smoked. She  has never used smokeless tobacco. She reports current alcohol use. She reports that she does not use drugs.   Family History:  Her family history includes Cerebral aneurysm in her sister; Colon cancer (age of onset: 21) in her mother; Goiter in her sister; Hypertension in her father and mother.   Allergies Allergies  Allergen Reactions   Tetracyclines & Related Itching     Home Medications  Prior to Admission medications   Medication Sig Start Date End Date Taking? Authorizing Provider  albuterol (VENTOLIN HFA) 108 (90 Base) MCG/ACT inhaler INHALE 2 PUFFS INTO THE LUNGS EVERY 6 (SIX) HOURS AS NEEDED FOR WHEEZING OR SHORTNESS OF BREATH. 04/17/20  Yes Kerin Perna, NP  amLODipine (NORVASC) 10 MG tablet Take 1 tablet (10 mg total) by mouth daily. 03/11/21  Yes Mayers, Cari S, PA-C  chlorthalidone (HYGROTON) 50 MG tablet Take 1 tablet (50 mg total) by mouth daily. 03/11/21  Yes Mayers, Cari S, PA-C  cholecalciferol (VITAMIN D3) 25 MCG (1000 UNIT) tablet Take 1,000 Units by mouth daily.   Yes [provider]  dextromethorphan-guaiFENesin (MUCINEX DM) 30-600 MG 12hr tablet Take 1 tablet by mouth 2 (two) times daily as needed for cough.   Yes [provider]  diclofenac Sodium (VOLTAREN) 1 % GEL APPLY 4 GRAM(S) TOPICALLY TWICE DAILY AS NEEDED Patient taking differently: Apply 4 g topically 2 (two) times daily as needed (pain). 11/27/20  Yes Kerin Perna, NP  FLUoxetine (PROZAC) 20 MG capsule Take 1  capsule (20 mg total) by mouth daily. 03/11/21  Yes Mayers, Cari S, PA-C  hydrOXYzine (ATARAX/VISTARIL) 25 MG tablet TAKE 1 TABLET (25 MG TOTAL) BY MOUTH EVERY 8 (EIGHT) HOURS AS NEEDED. Patient taking differently: Take 25 mg by mouth every 8 (eight) hours as needed for anxiety. 11/05/20  Yes Kerin Perna, NP  ibuprofen (ADVIL) 800 MG tablet Take 1 tablet (800 mg total) by mouth every 8 (eight) hours as needed for moderate pain. 03/11/21  Yes Mayers, Cari S, PA-C  loratadine (CLARITIN) 10 MG tablet Take 1 tablet (10 mg total) by mouth daily. Patient taking differently: Take 10 mg by mouth daily as needed for allergies. 12/14/19  Yes Kerin Perna, NP  SYMBICORT 160-4.5 MCG/ACT inhaler Inhale 2 puffs into the lungs 2 (two) times daily. 03/11/21  Yes Mayers, Cari S, PA-C  traZODone (DESYREL) 50 MG tablet Take 1 tablet (50 mg total) by mouth at bedtime. 03/11/21  Yes Mayers, Cari S, PA-C  Blood Pressure Monitor KIT 1 kit by Does not apply route 3 (three) times daily as needed. 09/01/19   Kerin Perna, NP  cyclobenzaprine (FLEXERIL) 10 MG tablet TAKE 1 TABLET (10 MG TOTAL) BY MOUTH 3 (THREE) TIMES DAILY AS NEEDED FOR MUSCLE SPASMS. DO NOT DRINK ALCOHOL OR DRIVE WHILE TAKING THIS MEDICATION Patient not taking: No sig reported 07/11/20   Charlott Rakes, MD     Critical care time: 45 min     The patient is critically ill with multiple organ systems failure and requires high complexity decision making for assessment and support, frequent evaluation and titration of therapies, application of advanced monitoring technologies and extensive interpretation of multiple databases.  Independent Critical Care Time: 35 Minutes.   Rodman Pickle, M.D. [facility] Pulmonary/Critical Care Medicine 03/19/2021 8:42 AM   Please see Amion for pager number to reach on-call Pulmonary and Critical Care Team.

## 2021-03-19 NOTE — Progress Notes (Signed)
PCCM PM Rounds  Improving mentation with intermittent deep drowsiness. ABG with improved hypercarbia. Continue BiPAP. No indication to intubate at this time as she is improving.   PCCM will continue to follow

## 2021-03-19 NOTE — Plan of Care (Signed)
CARE PLAN REVIEWED WITH PATIENTS SON WHO IS AT BEDSIDE. FAMILY EXPRESSED AN UNDERSTANDING OF TREATMENT PLAN

## 2021-03-19 NOTE — Progress Notes (Signed)
[facility]                                                                              Patient Demographics  [name] [name], is a 60 y.o. female, DOB - 12/02/1960, WE:[id number]  Admit date - 03/18/2021   Admitting Physician Tacey Ruiz, MD  Outpatient Primary MD for the patient is Kerin Perna, NP  Outpatient specialists:   LOS - 0  days   Medical records reviewed and are as summarized below:    Chief Complaint  Patient presents with   Shortness of Breath       Brief summary   Patient is a 60 year old female with history of intermittent asthma, alcohol use, obesity presented to ED with shortness of breath.  Patient reported, she developed sore throat on 8/15, next week she started having myalgias.  On 8/26, she started having wheezing, took a home COVID test which was negative on 8/27.  Shortness of breath and dyspnea continued to worsen and was short of breath even at rest.  Patient was found to be hypoxic in ED and was admitted for acute asthma.  8/30: Rapid response was called this a.m. for acute mental status changes, unresponsive.  Stat ABG showed pH of 7.1, PCO2 109, ammonia level 88, was given 1 dose of Narcan.  Narcotics discontinued.  Chest x-ray showed increasing vascular congestion.  BNP 212 Patient was transferred to stepdown unit, started on BiPAP.  CCM was consulted.  Likely has undiagnosed OSA.  Assessment & Plan    Principal Problem: Acute on chronic hypercarbic respiratory failure, hypoxia, acute asthma exacerbation -In the setting of possible obesity hypoventilation, undiagnosed OSA -Patient was placed on BiPAP, repeat ABG shows improving CO2 -Placed on scheduled nebs, IV steroids -BNP 212, patient received 1 dose of IV Lasix, follow 2D echo -Per RN, overnight was alert and oriented, on examination, coarse breath sounds throughout, unclear if she had aspirated.  No report of nausea or vomiting, now spiking temp of 100.9 F.  - Will  place on IV Unasyn, obtain blood cultures, procalcitonin  Active Problems: Alcohol use -Currently on CIWA protocol with Ativan -Continue thiamine, folate, MVI  Obesity Estimated body mass index is 42.65 kg/m as calculated from the following:   Height as of this encounter: '5\' 3"'$  (1.6 m).   Weight as of this encounter: 109.2 kg. -Likely has obesity hypoventilation syndrome and possibly undiagnosed OSA -Will need sleep study outpatient.   Code Status: Full CODE STATUS DVT Prophylaxis:  enoxaparin (LOVENOX) injection 40 mg Start: 03/18/21 2200 SCDs Start: 03/18/21 1100   Level of Care: Level of care: Stepdown Family Communication: Discussed all imaging results, lab results, explained to the patient's brother on the phone   Disposition Plan:     Status is: Inpatient    Dispo: The patient is from: Home              Anticipated d/c is to: Home              Patient currently is not medically stable to d/c.   Difficult to place patient No      Time Spent in  minutes   45 minutes  Procedures:  BiPAP  Consultants:   PCCM  Antimicrobials:   Anti-infectives (From admission, onward)    Start     Dose/Rate Route Frequency Ordered Stop   03/19/21 1400  Ampicillin-Sulbactam (UNASYN) 3 g in sodium chloride 0.9 % 100 mL IVPB        3 g 200 mL/hr over 30 Minutes Intravenous Every 6 hours 03/19/21 1229            Medications  Scheduled Meds:  amLODipine  10 mg Oral Daily   budesonide (PULMICORT) nebulizer solution  0.25 mg Nebulization BID   Chlorhexidine Gluconate Cloth  6 each Topical Daily   cholecalciferol  1,000 Units Oral Daily   diclofenac Sodium  4 g Topical BID   enoxaparin (LOVENOX) injection  40 mg Subcutaneous Q24H   FLUoxetine  20 mg Oral Daily   folic acid  1 mg Oral Daily   ipratropium-albuterol  3 mL Nebulization QID   loratadine  10 mg Oral Daily   methylPREDNISolone (SOLU-MEDROL) injection  125 mg Intravenous Daily   multivitamin with minerals  1  tablet Oral Daily   naLOXone (NARCAN)  injection  0.4 mg Intravenous Once   thiamine  100 mg Oral Daily   Or   thiamine  100 mg Intravenous Daily   Continuous Infusions:  ampicillin-sulbactam (UNASYN) IV     PRN Meds:.acetaminophen **OR** acetaminophen, albuterol, bisacodyl, dextromethorphan-guaiFENesin, hydrOXYzine, labetalol, LORazepam, ondansetron **OR** ondansetron (ZOFRAN) IV, senna-docusate      Subjective:   [name] [name] was seen and examined today.  Called this a.m. for the rapid response, patient unresponsive, barely opens eyes on sternal rub.  Coarse rhonchorous breath sounds throughout.  Per RN, was alert and oriented yesterday.  Objective:   Vitals:   03/19/21 0850 03/19/21 0859 03/19/21 1152 03/19/21 1154  BP:      Pulse:    93  Resp:    20  Temp:    (!) 100.9 F (38.3 C)  TempSrc:    Axillary  SpO2: 98% 98% 94% 94%  Weight:      Height:       No intake or output data in the 24 hours ending 03/19/21 1230   Wt Readings from Last 3 Encounters:  03/19/21 109.2 kg  03/11/21 108.9 kg  12/14/19 103.4 kg     Exam General: Lethargic, opens eyes to sternal rub Cardiovascular: S1 S2 auscultated, no murmurs, RRR Respiratory: Coarse breath sounds throughout Gastrointestinal: Obese, soft, nondistended, + bowel sounds Ext: no pedal edema bilaterally Neuro: unable to assess bilaterally Skin: No rashes Psych: lethargic   Data Reviewed:  I have personally reviewed following labs and imaging studies  Micro Results Recent Results (from the past 240 hour(s))  Resp Panel by RT-PCR (Flu A&B, Covid) Nasopharyngeal Swab     Status: None   Collection Time: 03/18/21  6:44 AM   Specimen: Nasopharyngeal Swab; Nasopharyngeal(NP) swabs in vial transport medium  Result Value Ref Range Status   SARS Coronavirus 2 by RT PCR NEGATIVE NEGATIVE Final    Comment: (NOTE) SARS-CoV-2 target nucleic acids are NOT DETECTED.  The SARS-CoV-2 RNA is generally detectable in upper  respiratory specimens during the acute phase of infection. The lowest concentration of SARS-CoV-2 viral copies this assay can detect is 138 copies/mL. A negative result does not preclude SARS-Cov-2 infection and should not be used as the sole basis for treatment or other patient management decisions. A negative result may occur with  improper specimen collection/handling,  submission of specimen other than nasopharyngeal swab, presence of viral mutation(s) within the areas targeted by this assay, and inadequate number of viral copies(<138 copies/mL). A negative result must be combined with clinical observations, patient history, and epidemiological information. The expected result is Negative.  Fact Sheet for Patients:  EntrepreneurPulse.com.au  Fact Sheet for Healthcare Providers:  IncredibleEmployment.be  This test is no t yet approved or cleared by the [place] FDA and  has been authorized for detection and/or diagnosis of SARS-CoV-2 by FDA under an Emergency Use Authorization (EUA). This EUA will remain  in effect (meaning this test can be used) for the duration of the COVID-19 declaration under Section 564(b)(1) of the Act, 21 U.S.C.section 360bbb-3(b)(1), unless the authorization is terminated  or revoked sooner.       Influenza A by PCR NEGATIVE NEGATIVE Final   Influenza B by PCR NEGATIVE NEGATIVE Final    Comment: (NOTE) The Xpert Xpress SARS-CoV-2/FLU/RSV plus assay is intended as an aid in the diagnosis of influenza from Nasopharyngeal swab specimens and should not be used as a sole basis for treatment. Nasal washings and aspirates are unacceptable for Xpert Xpress SARS-CoV-2/FLU/RSV testing.  Fact Sheet for Patients: EntrepreneurPulse.com.au  Fact Sheet for Healthcare Providers: IncredibleEmployment.be  This test is not yet approved or cleared by the [place] FDA and has been  authorized for detection and/or diagnosis of SARS-CoV-2 by FDA under an Emergency Use Authorization (EUA). This EUA will remain in effect (meaning this test can be used) for the duration of the COVID-19 declaration under Section 564(b)(1) of the Act, 21 U.S.C. section 360bbb-3(b)(1), unless the authorization is terminated or revoked.  Performed at [facility], [place] [address] [place], [place] [address]     Radiology Reports DG Chest 1 View  Result Date: 03/18/2021 CLINICAL DATA:  Shortness of breath. EXAM: CHEST  1 VIEW COMPARISON:  01/01/2011 FINDINGS: 0651 hours. The cardio pericardial silhouette is enlarged. Left base atelectasis or infiltrate. Right lung clear. The visualized bony structures of the thorax show no acute abnormality. IMPRESSION: Cardiomegaly with left base atelectasis or infiltrate. Dedicated PA and lateral upright chest x-ray recommended, when patient is able. Electronically Signed   By: Misty Stanley M.D.   On: 03/18/2021 07:34   DG CHEST PORT 1 VIEW  Result Date: 03/19/2021 CLINICAL DATA:  Shortness of breath EXAM: PORTABLE CHEST 1 VIEW COMPARISON:  03/18/2021 FINDINGS: Cardiac shadow is enlarged but accentuated by the portable technique. Mild central vascular congestion is seen slightly increased from the prior exam. No focal infiltrate or effusion is noted. No bony abnormality is seen. IMPRESSION: Increase in vascular congestion likely representing early CHF. Electronically Signed   By: Inez Catalina M.D.   On: 03/19/2021 08:42   DG Shoulder Left  Result Date: 03/12/2021 CLINICAL DATA:  Left shoulder pain.  Chronic left shoulder pain EXAM: LEFT SHOULDER - 2+ VIEW COMPARISON:  None. FINDINGS: There is no evidence of fracture or dislocation. Minimal acromioclavicular spurring. Small subacromial spur. Trace inferior glenoid spurring. No erosion, evidence of avascular necrosis or focal bone abnormality. Soft tissues are unremarkable. IMPRESSION: Minimal  osteoarthritis of the acromioclavicular and glenohumeral joint. Small subacromial spur. Electronically Signed   By: Keith Rake M.D.   On: 03/12/2021 23:48   DG HIP UNILAT W OR W/O PELVIS 2-3 VIEWS LEFT  Result Date: 03/12/2021 CLINICAL DATA:  Left hip pain and arthralgia. Lateral and posterior hip pain for 1 year. EXAM: DG HIP (WITH OR WITHOUT PELVIS) 2-3V LEFT COMPARISON:  None. FINDINGS: Moderate left hip joint space narrowing. There is lateral acetabular spurring with mild subchondral cystic change. Medial femoral head neck spurring. No evidence of fracture. The pubic rami are intact no visualized avascular necrosis, focal bone lesion, erosion or bony destruction. Pubic symphysis and sacroiliac joints are congruent. Moderate right hip osteoarthritis is also noted. IMPRESSION: Moderate left hip osteoarthritis. Electronically Signed   By: Keith Rake M.D.   On: 03/12/2021 23:49    Lab Data:  CBC: Recent Labs  Lab 03/18/21 0639 03/19/21 0526  WBC 7.6 13.8*  NEUTROABS 6.2  --   HGB 10.3* 11.2*  HCT 32.4* 36.0  MCV 92.6 94.0  PLT 193 [id number]   Basic Metabolic Panel: Recent Labs  Lab 03/18/21 0639 03/18/21 1923 03/19/21 0526  NA 140 142 137  K 2.6* 3.7 4.4  CL 98 100 98  CO2 32 31 32  GLUCOSE 196* 187* 208*  BUN '8 13 13  '$ CREATININE 0.56 0.68 0.56  CALCIUM 9.2 9.4 9.5  MG  --  1.7  --    GFR: Estimated Creatinine Clearance: 88.7 mL/min (by C-G formula based on SCr of 0.56 mg/dL). Liver Function Tests: Recent Labs  Lab 03/18/21 0639  AST 17  ALT 18  ALKPHOS 57  BILITOT 0.5  PROT 7.4  ALBUMIN 3.8   No results for input(s): LIPASE, AMYLASE in the last 168 hours. Recent Labs  Lab 03/19/21 0903  AMMONIA 88*   Coagulation Profile: No results for input(s): INR, PROTIME in the last 168 hours. Cardiac Enzymes: No results for input(s): CKTOTAL, CKMB, CKMBINDEX, TROPONINI in the last 168 hours. BNP (last 3 results) No results for input(s): PROBNP in the last 8760  hours. HbA1C: No results for input(s): HGBA1C in the last 72 hours. CBG: Recent Labs  Lab 03/19/21 0755  GLUCAP 200*   Lipid Profile: No results for input(s): CHOL, HDL, LDLCALC, TRIG, CHOLHDL, LDLDIRECT in the last 72 hours. Thyroid Function Tests: No results for input(s): TSH, T4TOTAL, FREET4, T3FREE, THYROIDAB in the last 72 hours. Anemia Panel: No results for input(s): VITAMINB12, FOLATE, FERRITIN, TIBC, IRON, RETICCTPCT in the last 72 hours. Urine analysis:    Component Value Date/Time   COLORURINE YELLOW 10/28/2019 1305   APPEARANCEUR CLEAR 10/28/2019 1305   LABSPEC 1.009 10/28/2019 1305   PHURINE 7.0 10/28/2019 1305   GLUCOSEU NEGATIVE 10/28/2019 1305   HGBUR NEGATIVE 10/28/2019 1305   [place] 10/28/2019 1305   [place] 10/28/2019 1305   PROTEINUR NEGATIVE 10/28/2019 1305   UROBILINOGEN 0.2 04/07/2011 1405   NITRITE NEGATIVE 10/28/2019 1305   LEUKOCYTESUR NEGATIVE 10/28/2019 1305     Chrissi Crow M.D. [facility] 03/19/2021, 12:30 PM  Available via Epic secure chat 7am-7pm After 7 pm, please refer to night coverage provider listed on amion.

## 2021-03-20 ENCOUNTER — Telehealth (INDEPENDENT_AMBULATORY_CARE_PROVIDER_SITE_OTHER): Payer: Self-pay | Admitting: Primary Care

## 2021-03-20 DIAGNOSIS — J4541 Moderate persistent asthma with (acute) exacerbation: Secondary | ICD-10-CM

## 2021-03-20 DIAGNOSIS — R0602 Shortness of breath: Secondary | ICD-10-CM

## 2021-03-20 DIAGNOSIS — J4521 Mild intermittent asthma with (acute) exacerbation: Secondary | ICD-10-CM | POA: Diagnosis not present

## 2021-03-20 LAB — BASIC METABOLIC PANEL
Anion gap: 8 (ref 5–15)
BUN: 18 mg/dL (ref 6–20)
CO2: 37 mmol/L — ABNORMAL HIGH (ref 22–32)
Calcium: 9.4 mg/dL (ref 8.9–10.3)
Chloride: 101 mmol/L (ref 98–111)
Creatinine, Ser: 0.62 mg/dL (ref 0.44–1.00)
GFR, Estimated: >60 mL/min (ref >60)
Glucose, Bld: 167 mg/dL — ABNORMAL HIGH (ref 70–99)
Potassium: 3.7 mmol/L (ref 3.5–5.1)
Sodium: 146 mmol/L — ABNORMAL HIGH (ref 135–145)

## 2021-03-20 LAB — CBC
HCT: 33.3 % — ABNORMAL LOW (ref 36.0–46.0)
Hemoglobin: 10.3 g/dL — ABNORMAL LOW (ref 12.0–15.0)
MCH: 29.3 pg (ref 26.0–34.0)
MCHC: 30.9 g/dL (ref 30.0–36.0)
MCV: 94.6 fL (ref 80.0–100.0)
Platelets: 236 10*3/uL (ref 150–400)
RBC: 3.52 MIL/uL — ABNORMAL LOW (ref 3.87–5.11)
RDW: 13.2 % (ref 11.5–15.5)
WBC: 18.8 10*3/uL — ABNORMAL HIGH (ref 4.0–10.5)
nRBC: 0 % (ref 0.0–0.2)

## 2021-03-20 LAB — HEMOGLOBIN A1C
Hgb A1c MFr Bld: 5.8 % — ABNORMAL HIGH (ref 4.8–5.6)
Mean Plasma Glucose: 120 mg/dL

## 2021-03-20 MED ORDER — GUAIFENESIN ER 600 MG PO TB12
1200.0000 mg | ORAL_TABLET | Freq: Two times a day (BID) | ORAL | Status: DC
  Administered 2021-03-20 – 2021-03-25 (×11): 1200 mg via ORAL
  Filled 2021-03-20 (×11): qty 2

## 2021-03-20 MED ORDER — FUROSEMIDE 10 MG/ML IJ SOLN
40.0000 mg | Freq: Once | INTRAMUSCULAR | Status: AC
  Administered 2021-03-20: 40 mg via INTRAVENOUS
  Filled 2021-03-20: qty 4

## 2021-03-20 MED ORDER — METHYLPREDNISOLONE SODIUM SUCC 40 MG IJ SOLR
40.0000 mg | Freq: Two times a day (BID) | INTRAMUSCULAR | Status: DC
  Administered 2021-03-20 – 2021-03-21 (×3): 40 mg via INTRAVENOUS
  Filled 2021-03-20 (×3): qty 1

## 2021-03-20 MED ORDER — FLUTICASONE FUROATE-VILANTEROL 200-25 MCG/INH IN AEPB
1.0000 | INHALATION_SPRAY | Freq: Every day | RESPIRATORY_TRACT | Status: DC
  Administered 2021-03-20 – 2021-03-25 (×6): 1 via RESPIRATORY_TRACT
  Filled 2021-03-20: qty 28

## 2021-03-20 MED ORDER — METHYLPREDNISOLONE SODIUM SUCC 125 MG IJ SOLR: 80.0000 mg | Freq: Every day | INTRAMUSCULAR | Status: DC

## 2021-03-20 NOTE — Progress Notes (Signed)
During rounds Dr. Loanne Drilling has requested that patient wear Bipap continuous at night. Will continue to monitor.

## 2021-03-20 NOTE — TOC Initial Note (Signed)
Transition of Care [facility]) - Initial/Assessment Note    Patient Details  Name: [name] [name] MRN: [id number] Date of Birth: 01/19/1961  Transition of [facility]) CM/SW Contact:    Leeroy Cha, RN Phone Number: 03/20/2021, 8:34 AM  Clinical Narrative:                  60 year old female with history of intermittent asthma, alcohol use, obesity presented to ED with shortness of breath.  Patient reported, she developed sore throat on 8/15, next week she started having myalgias.  On 8/26, she started having wheezing, took a home COVID test which was negative on 8/27.  Shortness of breath and dyspnea continued to worsen and was short of breath even at rest.  Patient was found to be hypoxic in ED and was admitted for acute asthma.   8/30: Rapid response was called this a.m. for acute mental status changes, unresponsive.  Stat ABG showed pH of 7.1, PCO2 109, ammonia level 88, was given 1 dose of Narcan.  Narcotics discontinued.  Chest x-ray showed increasing vascular congestion.  BNP 212 Patient was transferred to stepdown unit, started on BiPAP.  CCM was consulted.  Likely has undiagnosed OSA.   Assessment & Plan      Principal Problem: Acute on chronic hypercarbic respiratory failure, hypoxia, acute asthma exacerbation -In the setting of possible obesity hypoventilation, undiagnosed OSA -Patient was placed on BiPAP, repeat ABG shows improving CO2 -Placed on scheduled nebs, IV steroids -BNP 212, patient received 1 dose of IV Lasix, follow 2D echo -Per RN, overnight was alert and oriented, on examination, coarse breath sounds throughout, unclear if she had aspirated.  No report of nausea or vomiting, now spiking temp of 100.9 F.  - Will place on IV Unasyn, obtain blood cultures, procalcitonin   Active Problems: Alcohol use -Currently on CIWA protocol with Ativan -Continue thiamine, folate, MVI   Obesity Estimated body mass index is 42.65 kg/m as calculated from the following:    Height as of this encounter: '5\' 3"'$  (1.6 m).   Weight as of this encounter: 109.2 kg. -Likely has obesity hypoventilation syndrome and possibly undiagnosed OSA -Will need sleep study outpatient.    Code Status: Full CODE STATUS DVT Prophylaxis:  enoxaparin (LOVENOX) injection 40 mg Start: 03/18/21 2200 SCDs Start: 03/18/21 1100  Expected Discharge Plan: Home/Self Care Barriers to Discharge: Continued Medical Work up  [place]: To return to home with self care from home.  Following for hhc and dme needs\ Progression: Bipap at night, 02 at 2l/[place], iv solu medrol, iv unasyn, wbc 18.8 Patient Goals and CMS Choice        Expected Discharge Plan and Services Expected Discharge Plan: Home/Self Care   Discharge Planning Services: CM Consult   Living arrangements for the past 2 months: Single Family Home                                      Prior Living Arrangements/Services Living arrangements for the past 2 months: Single Family Home Lives with:: Self Patient language and need for interpreter reviewed:: Yes              Criminal Activity/Legal Involvement Pertinent to Current Situation/Hospitalization: No - Comment as needed  Activities of Daily Living Home Assistive Devices/Equipment: Blood pressure cuff ADL Screening (condition at time of admission) Patient's cognitive ability adequate to safely complete daily activities?: Yes  Is the patient deaf or have difficulty hearing?: No Does the patient have difficulty seeing, even when wearing glasses/contacts?: No Does the patient have difficulty concentrating, remembering, or making decisions?: No Patient able to express need for assistance with ADLs?: Yes Does the patient have difficulty dressing or bathing?: No Independently performs ADLs?: Yes (appropriate for developmental age) Does the patient have difficulty walking or climbing stairs?: Yes (secondary to shortness of breath and weakness) Weakness of Legs:  Both (has chronic left hip pain) Weakness of Arms/Hands: None  Permission Sought/Granted                  Emotional Assessment Appearance:: Appears stated age     Orientation: : Oriented to Self, Oriented to Place, Oriented to  Time, Oriented to Situation Alcohol / Substance Use: Not Applicable Psych Involvement: No (comment)  Admission diagnosis:  Hypokalemia [E87.6] Moderate persistent asthma with exacerbation [J45.41] Asthma exacerbation [J45.901] Acute respiratory failure ([place]) [J96.00] Patient Active Problem List   Diagnosis Date Noted   Acute respiratory failure ([place]) 03/19/2021   Mild intermittent asthma with (acute) exacerbation 03/18/2021   Hypoxia 03/18/2021   Chronic left hip pain 03/12/2021   Chronic left shoulder pain 03/12/2021   Mild intermittent asthma without complication [id number]   Lump of axilla, left 03/12/2021   Anxiety with depression 10/06/2017   Healthcare maintenance 10/06/2017   Osteoarthritis, multiple sites 10/06/2017   Alcohol use disorder 07/06/2015   Prepyloric ulcer 06/22/2015   Elevated blood pressure reading with diagnosis of hypertension 04/14/2015   Seasonal allergies 04/14/2015   PCP:  Kerin Perna, NP Pharmacy:   [place], [place] - [address] [place] [address] Phone: [phone] Fax: [phone]     Social Determinants of Health (SDOH) Interventions    Readmission Risk Interventions No flowsheet data found.

## 2021-03-20 NOTE — Consult Note (Signed)
NAME:  [name] [name], MRN:  [id number], DOB:  [birth year], LOS: 1 ADMISSION DATE:  03/18/2021, CONSULTATION DATE:  03/19/21 REFERRING MD:  Tana Coast, MD CHIEF COMPLAINT:  Respiratory failure   History of Present Illness:  60 year old female with asthma admitted for asthma exacerbation and transferred to the ICU for altered mental status secondary to acute on chronic hypercarbic respiratory failure.  Patient unable to participate in interview due to obtunded status. History obtained per chart review. Two weeks ago, she had sore throat, muscle aches and joint pain followed five days later with shortness of breath and wheezing. Home covid-19 test negative. Shortness of breath and wheezing worsened. In the ED she was treated with albuterol and IV steroids. Found to be hypoxemic to 89% and started on supplemental oxygen. This morning on evaluation she had altered mental status. ABG consistent with acute on chronic hypercarbic respiratory failure. 7/152/109/80.6. CXR with increased vascular congestion compared to admission. Transferred to SDU to start BiPAP. PCCM consulted.  Pertinent  Medical History  Asthma, seasonal allergies, etoh abuse  Significant Hospital Events: Including procedures, antibiotic start and stop dates in addition to other pertinent events   8/29 Admitted to [facility] 8/30 Transferred to SDU for BiPAP for respiratory acidosis. PCCM consulted 8/31 Tolerated BiPAP with improved mental status  Interim History / Subjective:  Tolerated BiPAP with improved mental status  Objective   Blood pressure 126/72, pulse 92, temperature 97.6 F (36.4 C), temperature source Axillary, resp. rate (!) 25, height '5\' 3"'$  (1.6 m), weight 108.6 kg, SpO2 95 %.    FiO2 (%):  [28 %-40 %] 28 %   Intake/Output Summary (Last 24 hours) at 03/20/2021 1053 Last data filed at 03/20/2021 0800 Gross per 24 hour  Intake 192.85 ml  Output 2300 ml  Net -2107.15 ml   Filed Weights   03/18/21 0642 03/19/21 0553 03/20/21  0500  Weight: 108.9 kg 109.2 kg 108.6 kg   Physical Exam: General: Obese, well-appearing, no acute distress HENT: [place], AT, OP clear, MMM Eyes: EOMI, no scleral icterus Respiratory: Expiratory wheezing Cardiovascular: RRR, -M/R/G, no JVD GI: BS+, soft, nontender Extremities:-Edema,-tenderness Neuro: AAO x4, CNII-XII grossly intact, follows commands  ABG    Component Value Date/Time   PHART 7.364 03/19/2021 1508   PCO2ART 65.3 (HH) 03/19/2021 1508   PO2ART 80.0 (L) 03/19/2021 1508   HCO3 36.3 (H) 03/19/2021 1508   TCO2 28 01/01/2011 1532   O2SAT 95.7 03/19/2021 1508   WBC 18.8 worsening Hg stable anemia  BMET reviewed CO2 37, mild hypernatremia 146 BNP 212  Echo [id number] Grade II diastolic dysfunction. EF 60-65%. Mildly elevated PASP  CXR 8/30 - Cardiomegaly. Increased vascular congestion. Left lower lobe atelectasis vs consolidation  Resolved Hospital Problem list     Assessment & Plan:   Asthma exacerbation - improving Acute on chronic hypercarbic respiratory failure secondary to suspected undiagnosed OSA - witnessed apnea per family, no prior sleep study Acute diastolic heart failure --Transition to BiPAP nightly and when sleeping --Wean supplemental oxygen for goal SpO2 >88% --Repeat lasix x 1 --Continue bronchodilators: scheduled Duonebs and PRN albuterol --DC pulmicort and start Breo. Can resume home Symbicort on discharge however formula not available while inpatient. --Continue steroids. Consider weaning in 24-48 hours --Minimize sedating medications --Will need to obtain outpatient sleep study  --Will need Pulmonary follow-up as outpatient. Patient can call office when discharged  EtOH abuse --CIWA --Folic acid, multivitamin  Pulmonary will continue to follow  Best Practice (right click and "Reselect all SmartList  Selections" daily)   Diet/type: NPO DVT prophylaxis: LMWH GI prophylaxis: N/A Lines: N/A Foley:  N/A Code Status:  full code Last date of  multidisciplinary goals of care discussion [8/30] Full code - confirmed with son   Labs   CBC: Recent Labs  Lab 03/18/21 0639 03/19/21 0526 03/20/21 0255  WBC 7.6 13.8* 18.8*  NEUTROABS 6.2  --   --   HGB 10.3* 11.2* 10.3*  HCT 32.4* 36.0 33.3*  MCV 92.6 94.0 94.6  PLT 193 276 [id number]    Basic Metabolic Panel: Recent Labs  Lab 03/18/21 0639 03/18/21 1923 03/19/21 0526 03/19/21 1657 03/20/21 0255  NA 140 142 137  --  146*  K 2.6* 3.7 4.4 3.8 3.7  CL 98 100 98  --  101  CO2 32 31 32  --  37*  GLUCOSE 196* 187* 208*  --  167*  BUN '8 13 13  '$ --  18  CREATININE 0.56 0.68 0.56  --  0.62  CALCIUM 9.2 9.4 9.5  --  9.4  MG  --  1.7  --   --   --    GFR: Estimated Creatinine Clearance: 88.4 mL/min (by C-G formula based on SCr of 0.62 mg/dL). Recent Labs  Lab 03/18/21 0639 03/19/21 0526 03/19/21 1403 03/20/21 0255  PROCALCITON  --   --  <0.10  --   WBC 7.6 13.8*  --  18.8*    Liver Function Tests: Recent Labs  Lab 03/18/21 0639  AST 17  ALT 18  ALKPHOS 57  BILITOT 0.5  PROT 7.4  ALBUMIN 3.8   No results for input(s): LIPASE, AMYLASE in the last 168 hours. Recent Labs  Lab 03/19/21 0903  AMMONIA 88*    ABG    Component Value Date/Time   PHART 7.364 03/19/2021 1508   PCO2ART 65.3 (HH) 03/19/2021 1508   PO2ART 80.0 (L) 03/19/2021 1508   HCO3 36.3 (H) 03/19/2021 1508   TCO2 28 01/01/2011 1532   O2SAT 95.7 03/19/2021 1508     Coagulation Profile: No results for input(s): INR, PROTIME in the last 168 hours.  Cardiac Enzymes: No results for input(s): CKTOTAL, CKMB, CKMBINDEX, TROPONINI in the last 168 hours.  HbA1C: Hgb A1c MFr Bld  Date/Time Value Ref Range Status  03/19/2021 05:26 AM 5.8 (H) 4.8 - 5.6 % Final    Comment:    (NOTE)         Prediabetes: 5.7 - 6.4         Diabetes: >6.4         Glycemic control for adults with diabetes: <7.0   02/10/2019 10:45 AM 5.3 4.8 - 5.6 % Final    Comment:             Prediabetes: 5.7 - 6.4           Diabetes: >6.4          Glycemic control for adults with diabetes: <7.0     CBG: Recent Labs  Lab 03/19/21 0755  GLUCAP 200*    Critical care time: 32 min    The patient is critically ill with multiple organ systems failure and requires high complexity decision making for assessment and support, frequent evaluation and titration of therapies, application of advanced monitoring technologies and extensive interpretation of multiple databases.   Rodman Pickle, M.D. [facility] Pulmonary/Critical Care Medicine 03/20/2021 10:53 AM   Please see Amion for pager number to reach on-call Pulmonary and Critical Care Team.

## 2021-03-20 NOTE — Progress Notes (Signed)
[facility]  PROGRESS NOTE  [name] [name] [id number] DOB: 1961-01-06 DOA: 03/18/2021 PCP: Kerin Perna, NP   Brief HPI:   60 year old female with a history of intermittent asthma, alcohol use presented to ED on 03/18/2021 with shortness of breath.  Home COVID test was negative on 03/16/2021.  In the ED patient received albuterol treatment and Solu-Medrol 125 mg IV x1.  O2 sats were 89% on room air.  SARS-CoV-2 RT-PCR was negative.    Subjective   BiPAP removed this morning, feels hungry.  Currently on 2 L/min of oxygen via nasal cannula.   Assessment/Plan:     Acute on chronic hypercarbic respiratory failure -Resolved -Off BiPAP -Start Mucinex 1200 mg p.o. twice daily; flutter valve every 4 hours while awake -Change Solu-Medrol to 40 mg IV every 12 hours -Continue BiPAP at bedtime and while sleeping -Continue bronchodilators, scheduled DuoNebs and as needed albuterol -Pulmicort was discontinued and Breo was started by pulmonology -She will need outpatient sleep study for sleep apnea -We will follow pulmonology as outpatient  Alcohol use -No signs and symptoms of alcohol withdrawal -Continue CIWA protocol with Ativan -Continue thiamine, folate  Obesity -BMI 42.65 kg/m -Likely has obesity hypoventilation syndrome and possibly undiagnosed OSA -Sleep study as outpatient  Hypertension -Blood pressure is fairly controlled -Continue amlodipine  Leukocytosis -WBC is 18,000, likely from steroids -Afebrile, no infectious etiology noted -Not on antibiotics    Scheduled medications:    amLODipine  10 mg Oral Daily   Chlorhexidine Gluconate Cloth  6 each Topical Daily   cholecalciferol  1,000 Units Oral Daily   diclofenac Sodium  4 g Topical BID   enoxaparin (LOVENOX) injection  40 mg Subcutaneous Q24H   FLUoxetine  20 mg Oral Daily   fluticasone furoate-vilanterol  1 puff Inhalation Daily   folic acid  1 mg Oral Daily   guaiFENesin  1,200 mg Oral BID    ipratropium-albuterol  3 mL Nebulization QID   loratadine  10 mg Oral Daily   methylPREDNISolone (SOLU-MEDROL) injection  40 mg Intravenous Q12H   multivitamin with minerals  1 tablet Oral Daily   naLOXone (NARCAN)  injection  0.4 mg Intravenous Once   thiamine  100 mg Oral Daily   Or   thiamine  100 mg Intravenous Daily   Urelle  1 tablet Oral TID         Data Reviewed:   CBG:  Recent Labs  Lab 03/19/21 0755  GLUCAP 200*    SpO2: 96 % O2 Flow Rate (L/min): 2 L/min FiO2 (%): 28 %    Vitals:   03/20/21 1200 03/20/21 1300 03/20/21 1400 03/20/21 1500  BP: (!) 151/88 131/81 (!) 148/97 (!) 126/109  Pulse: (!) 101 96 94 (!) 122  Resp: (!) 22 (!) 26 (!) 22 (!) 21  Temp: (!) 97 F (36.1 C)     TempSrc: Axillary     SpO2: 95% 95% 96% 96%  Weight:      Height:         Intake/Output Summary (Last 24 hours) at 03/20/2021 1546 Last data filed at 03/20/2021 1200 Gross per 24 hour  Intake 192.85 ml  Output 2560 ml  Net -2367.15 ml    08/29 1901 - 08/31 0700 In: 192.9  Out: 1800 [Urine:1800]  Filed Weights   03/18/21 0642 03/19/21 0553 03/20/21 0500  Weight: 108.9 kg 109.2 kg 108.6 kg    CBC:  Recent Labs  Lab 03/18/21 0639 03/19/21 0526 03/20/21 0255  WBC 7.6 13.8*  18.8*  HGB 10.3* 11.2* 10.3*  HCT 32.4* 36.0 33.3*  PLT 193 276 236  MCV 92.6 94.0 94.6  MCH 29.4 29.2 29.3  MCHC 31.8 31.1 30.9  RDW 13.2 13.2 13.2  LYMPHSABS 1.0  --   --   MONOABS 0.3  --   --   EOSABS 0.1  --   --   BASOSABS 0.0  --   --     Complete metabolic panel:  Recent Labs  Lab 03/18/21 0639 03/18/21 0702 03/18/21 1923 03/19/21 0526 03/19/21 0903 03/19/21 1115 03/19/21 1403 03/19/21 1657 03/20/21 0255  NA 140  --  142 137  --   --   --   --  146*  K 2.6*  --  3.7 4.4  --   --   --  3.8 3.7  CL 98  --  100 98  --   --   --   --  101  CO2 32  --  31 32  --   --   --   --  37*  GLUCOSE 196*  --  187* 208*  --   --   --   --  167*  BUN 8  --  13 13  --   --   --   --   18  CREATININE 0.56  --  0.68 0.56  --   --   --   --  0.62  CALCIUM 9.2  --  9.4 9.5  --   --   --   --  9.4  AST 17  --   --   --   --   --   --   --   --   ALT 18  --   --   --   --   --   --   --   --   ALKPHOS 57  --   --   --   --   --   --   --   --   BILITOT 0.5  --   --   --   --   --   --   --   --   ALBUMIN 3.8  --   --   --   --   --   --   --   --   MG  --   --  1.7  --   --   --   --   --   --   PROCALCITON  --   --   --   --   --   --  <0.10  --   --   HGBA1C  --   --   --  5.8*  --   --   --   --   --   AMMONIA  --   --   --   --  88*  --   --   --   --   BNP  --  52.2  --   --   --  212.2*  --   --   --     No results for input(s): LIPASE, AMYLASE in the last 168 hours.  Recent Labs  Lab 03/18/21 0644 03/18/21 0702 03/19/21 1115 03/19/21 1403  BNP  --  52.2 212.2*  --   PROCALCITON  --   --   --  <0.10  SARSCOV2NAA NEGATIVE  --   --   --     ------------------------------------------------------------------------------------------------------------------  No results for input(s): CHOL, HDL, LDLCALC, TRIG, CHOLHDL, LDLDIRECT in the last 72 hours.  Lab Results  Component Value Date   HGBA1C 5.8 (H) 03/19/2021   ------------------------------------------------------------------------------------------------------------------ No results for input(s): TSH, T4TOTAL, T3FREE, THYROIDAB in the last 72 hours.  Invalid input(s): FREET3 ------------------------------------------------------------------------------------------------------------------ No results for input(s): VITAMINB12, FOLATE, FERRITIN, TIBC, IRON, RETICCTPCT in the last 72 hours.  Coagulation profile No results for input(s): INR, PROTIME in the last 168 hours. No results for input(s): DDIMER in the last 72 hours.  Cardiac Enzymes No results for input(s): CKTOTAL, CKMB, CKMBINDEX, TROPONINI in the last 168  hours.  ------------------------------------------------------------------------------------------------------------------    Component Value Date/Time   BNP 212.2 (H) 03/19/2021 1115     Antibiotics: Anti-infectives (From admission, onward)    Start     Dose/Rate Route Frequency Ordered Stop   03/19/21 2200  Urelle (URELLE/URISED) 81 MG tablet 81 mg  Status:  Discontinued        1 tablet over 2 Days Oral 3 times daily 03/19/21 1728 03/19/21 1800   03/19/21 2200  Urelle (URELLE/URISED) 81 MG tablet 81 mg        1 tablet over 2 Days Oral 3 times daily 03/19/21 1800 03/22/21 1512   03/19/21 1400  Ampicillin-Sulbactam (UNASYN) 3 g in sodium chloride 0.9 % 100 mL IVPB        3 g 200 mL/hr over 30 Minutes Intravenous Every 6 hours 03/19/21 1229          Radiology Reports  DG CHEST PORT 1 VIEW  Result Date: 03/19/2021 CLINICAL DATA:  Shortness of breath EXAM: PORTABLE CHEST 1 VIEW COMPARISON:  03/18/2021 FINDINGS: Cardiac shadow is enlarged but accentuated by the portable technique. Mild central vascular congestion is seen slightly increased from the prior exam. No focal infiltrate or effusion is noted. No bony abnormality is seen. IMPRESSION: Increase in vascular congestion likely representing early CHF. Electronically Signed   By: Inez Catalina M.D.   On: 03/19/2021 08:42   ECHOCARDIOGRAM COMPLETE  Result Date: 03/19/2021    ECHOCARDIOGRAM REPORT   Patient Name:   [name] [name] Date of Exam: 03/19/2021 Medical Rec #:  [id number]     Height:       63.0 in Accession #:    [id number]    Weight:       240.7 lb Date of Birth:  Feb 17, 1961      BSA:          2.092 m Patient Age:    30 years      BP:           137/75 mmHg Patient Gender: F             HR:           102 bpm. Exam Location:  Inpatient Procedure: 2D Echo, Cardiac Doppler and Color Doppler Indications:    hypoxemia  History:        Patient has no prior history of Echocardiogram examinations.                 Risk Factors:Hypertension.   Sonographer:    MH Referring Phys: [id number] [place]  1. Left ventricular ejection fraction, by estimation, is 60 to 65%. The left ventricle has normal function. The left ventricle has no regional wall motion abnormalities. There is moderate concentric left ventricular hypertrophy. Left ventricular diastolic parameters are consistent with Grade II diastolic dysfunction (pseudonormalization). Elevated left atrial pressure.  2. Right ventricular systolic function is  normal. The right ventricular size is normal. There is mildly elevated pulmonary artery systolic pressure.  3. Left atrial size was moderately dilated.  4. The mitral valve is normal in structure. Mild mitral valve regurgitation. No evidence of mitral stenosis.  5. The aortic valve is tricuspid. Aortic valve regurgitation is mild. No aortic stenosis is present.  6. The inferior vena cava is dilated in size with <50% respiratory variability, suggesting right atrial pressure of 15 mmHg. FINDINGS  Left Ventricle: Left ventricular ejection fraction, by estimation, is 60 to 65%. The left ventricle has normal function. The left ventricle has no regional wall motion abnormalities. The left ventricular internal cavity size was normal in size. There is  moderate concentric left ventricular hypertrophy. Left ventricular diastolic parameters are consistent with Grade II diastolic dysfunction (pseudonormalization). Elevated left atrial pressure. Right Ventricle: The right ventricular size is normal. No increase in right ventricular wall thickness. Right ventricular systolic function is normal. There is mildly elevated pulmonary artery systolic pressure. The tricuspid regurgitant velocity is 2.42  m/s, and with an assumed right atrial pressure of 15 mmHg, the estimated right ventricular systolic pressure is [id number] mmHg. Left Atrium: Left atrial size was moderately dilated. Right Atrium: Right atrial size was normal in size. Pericardium: There is no  evidence of pericardial effusion. Mitral Valve: The mitral valve is normal in structure. Mild mitral valve regurgitation. No evidence of mitral valve stenosis. Tricuspid Valve: The tricuspid valve is normal in structure. Tricuspid valve regurgitation is trivial. No evidence of tricuspid stenosis. Aortic Valve: The aortic valve is tricuspid. Aortic valve regurgitation is mild. Aortic regurgitation PHT measures 276 msec. No aortic stenosis is present. Aortic valve mean gradient measures 6.0 mmHg. Aortic valve peak gradient measures 10.6 mmHg. Aortic valve area, by VTI measures 1.90 cm. Pulmonic Valve: The pulmonic valve was not well visualized. Pulmonic valve regurgitation is not visualized. No evidence of pulmonic stenosis. Aorta: The aortic root is normal in size and structure. Venous: The inferior vena cava is dilated in size with less than 50% respiratory variability, suggesting right atrial pressure of 15 mmHg. IAS/Shunts: No atrial level shunt detected by color flow Doppler.  LEFT VENTRICLE PLAX 2D LVIDd:         4.10 cm     Diastology LVIDs:         3.00 cm     LV e' medial:  7.07 cm/s LV PW:         1.60 cm     LV e' lateral: 4.90 cm/s LV IVS:        1.70 cm LVOT diam:     1.90 cm LV SV:         49 LV SV Index:   24 LVOT Area:     2.84 cm  LV Volumes (MOD) LV vol d, MOD A4C: 30.1 ml LV vol s, MOD A4C: 13.5 ml LV SV MOD A4C:     30.1 ml RIGHT VENTRICLE             IVC RV S prime:     15.40 cm/s  IVC diam: 2.60 cm TAPSE (M-mode): 2.4 cm LEFT ATRIUM           Index       RIGHT ATRIUM           Index LA diam:      4.50 cm 2.15 cm/m  RA Area:     13.40 cm LA Vol (A2C): 37.6 ml 17.97 ml/m RA Volume:  27.80 ml  13.29 ml/m LA Vol (A4C): 31.0 ml 14.82 ml/m  AORTIC VALVE AV Area (Vmax):    1.83 cm AV Area (Vmean):   1.80 cm AV Area (VTI):     1.90 cm AV Vmax:           163.00 cm/s AV Vmean:          112.000 cm/s AV VTI:            0.259 m AV Peak Grad:      10.6 mmHg AV Mean Grad:      6.0 mmHg LVOT Vmax:          105.00 cm/s LVOT Vmean:        71.100 cm/s LVOT VTI:          0.174 m LVOT/AV VTI ratio: 0.67 AI PHT:  AORTA Ao Root diam: 3.40 cm Ao Asc diam:  3.30 cm MITRAL VALVE               TRICUSPID VALVE MV Area (PHT): 5.64 cm    TR Peak grad:   23.4 mmHg MV A velocity: 85.90 cm/s  TR Vmax:        242.00 cm/s                             SHUNTS                            Systemic VTI:  0.17 m                            Systemic Diam: 1.90 cm Mihai Croitoru MD Electronically signed by Sanda Klein MD Signature Date/Time: 03/19/2021/5:14:21 PM    Final       DVT prophylaxis: Lovenox  Code Status: Full code  Family Communication: No family at bedside   Consultants: PCCM  Procedures:     Objective    Physical Examination:   General: Appears in no acute distress Cardiovascular: S1-S2, regular, no murmur auscultated Respiratory: Bilateral rhonchi auscultated Abdomen: Soft, nontender, no organomegaly Extremities: No edema in the lower extremities Neurologic: Alert, oriented x3, intact insight and judgment   Status is: Inpatient  Dispo: The patient is from: Home              Anticipated d/c is to: Home              Anticipated d/c date is: 03/21/2021              Patient currently not stable for discharge  Barrier to discharge-ongoing management for acute hypercarbic respiratory failure  COVID-19 Labs  No results for input(s): DDIMER, FERRITIN, LDH, CRP in the last 72 hours.  Lab Results  Component Value Date   [place] NEGATIVE 03/18/2021    Microbiology  Recent Results (from the past 240 hour(s))  Resp Panel by RT-PCR (Flu A&B, Covid) Nasopharyngeal Swab     Status: None   Collection Time: 03/18/21  6:44 AM   Specimen: Nasopharyngeal Swab; Nasopharyngeal(NP) swabs in vial transport medium  Result Value Ref Range Status   SARS Coronavirus 2 by RT PCR NEGATIVE NEGATIVE Final    Comment: (NOTE) SARS-CoV-2 target nucleic acids are NOT DETECTED.  The SARS-CoV-2 RNA is  generally detectable in upper respiratory specimens during the acute phase of infection. The lowest concentration of SARS-CoV-2 viral copies this assay  can detect is 138 copies/mL. A negative result does not preclude SARS-Cov-2 infection and should not be used as the sole basis for treatment or other patient management decisions. A negative result may occur with  improper specimen collection/handling, submission of specimen other than nasopharyngeal swab, presence of viral mutation(s) within the areas targeted by this assay, and inadequate number of viral copies(<138 copies/mL). A negative result must be combined with clinical observations, patient history, and epidemiological information. The expected result is Negative.  Fact Sheet for Patients:  EntrepreneurPulse.com.au  Fact Sheet for Healthcare Providers:  IncredibleEmployment.be  This test is no t yet approved or cleared by the [place] FDA and  has been authorized for detection and/or diagnosis of SARS-CoV-2 by FDA under an Emergency Use Authorization (EUA). This EUA will remain  in effect (meaning this test can be used) for the duration of the COVID-19 declaration under Section 564(b)(1) of the Act, 21 U.S.C.section 360bbb-3(b)(1), unless the authorization is terminated  or revoked sooner.       Influenza A by PCR NEGATIVE NEGATIVE Final   Influenza B by PCR NEGATIVE NEGATIVE Final    Comment: (NOTE) The Xpert Xpress SARS-CoV-2/FLU/RSV plus assay is intended as an aid in the diagnosis of influenza from Nasopharyngeal swab specimens and should not be used as a sole basis for treatment. Nasal washings and aspirates are unacceptable for Xpert Xpress SARS-CoV-2/FLU/RSV testing.  Fact Sheet for Patients: EntrepreneurPulse.com.au  Fact Sheet for Healthcare Providers: IncredibleEmployment.be  This test is not yet approved or cleared by the [place] FDA and has been authorized for detection and/or diagnosis of SARS-CoV-2 by FDA under an Emergency Use Authorization (EUA). This EUA will remain in effect (meaning this test can be used) for the duration of the COVID-19 declaration under Section 564(b)(1) of the Act, 21 U.S.C. section 360bbb-3(b)(1), unless the authorization is terminated or revoked.  Performed at [facility], [place] [address] [place], [place] [address]   Culture, blood (routine x 2)     Status: None (Preliminary result)   Collection Time: 03/19/21  1:55 PM   Specimen: BLOOD  Result Value Ref Range Status   Specimen Description   Final    BLOOD RIGHT ANTECUBITAL Performed at [facility] [address], [place], [place] [address]    Special Requests   Final    BOTTLES DRAWN AEROBIC AND ANAEROBIC Blood Culture adequate volume Performed at [facility] [address], [place], [place] [address]    Culture   Final    NO GROWTH < 24 HOURS Performed at [facility] [address] [place], [place] [address]    Report Status PENDING  Incomplete  Culture, blood (routine x 2)     Status: None (Preliminary result)   Collection Time: 03/19/21  2:03 PM   Specimen: BLOOD  Result Value Ref Range Status   Specimen Description   Final    BLOOD LEFT ANTECUBITAL Performed at [facility] [address] [place], [place] [address]    Special Requests   Final    BOTTLES DRAWN AEROBIC AND ANAEROBIC Blood Culture adequate volume Performed at [facility] [address], [place], [place] [address]    Culture   Final    NO GROWTH < 24 HOURS Performed at [facility] [address], [place],  [address]    Report Status PENDING  Incomplete  Urine Culture     Status: Abnormal (Preliminary  result)   Collection Time: 03/19/21  2:51 PM   Specimen: Urine, Clean Catch  Result Value Ref Range Status    Specimen Description   Final    URINE, CLEAN CATCH Performed at [facility], [place] [address], [place], [place] [address]    Special Requests   Final    NONE Performed at [facility], [place] [address] [place], [place] [address]    Culture >=100,000 COLONIES/mL ESCHERICHIA COLI (A)  Final   Report Status PENDING  Incomplete  MRSA Next Gen by PCR, Nasal     Status: None   Collection Time: 03/19/21  5:03 PM   Specimen: Nasal Mucosa; Nasal Swab  Result Value Ref Range Status   MRSA by PCR Next Gen NOT DETECTED NOT DETECTED Final    Comment: (NOTE) The GeneXpert MRSA Assay (FDA approved for NASAL specimens only), is one component of a comprehensive MRSA colonization surveillance program. It is not intended to diagnose MRSA infection nor to guide or monitor treatment for MRSA infections. Test performance is not FDA approved in patients less than 41 years old. Performed at [facility], [place] [address] [place], [place] [address]              Oswald Hillock   [facility] If 7PM-7AM, please contact night-coverage at [email or web address], Office  [phone]   03/20/2021, 3:46 PM  LOS: 1 day

## 2021-03-21 ENCOUNTER — Ambulatory Visit: Payer: Medicare Other | Admitting: Orthopaedic Surgery

## 2021-03-21 ENCOUNTER — Telehealth (INDEPENDENT_AMBULATORY_CARE_PROVIDER_SITE_OTHER): Payer: Self-pay

## 2021-03-21 DIAGNOSIS — E876 Hypokalemia: Secondary | ICD-10-CM

## 2021-03-21 DIAGNOSIS — J4521 Mild intermittent asthma with (acute) exacerbation: Secondary | ICD-10-CM | POA: Diagnosis not present

## 2021-03-21 LAB — CBC
HCT: 33.6 % — ABNORMAL LOW (ref 36.0–46.0)
Hemoglobin: 10.8 g/dL — ABNORMAL LOW (ref 12.0–15.0)
MCH: 28.9 pg (ref 26.0–34.0)
MCHC: 32.1 g/dL (ref 30.0–36.0)
MCV: 89.8 fL (ref 80.0–100.0)
Platelets: 240 10*3/uL (ref 150–400)
RBC: 3.74 MIL/uL — ABNORMAL LOW (ref 3.87–5.11)
RDW: 13.1 % (ref 11.5–15.5)
WBC: 15 10*3/uL — ABNORMAL HIGH (ref 4.0–10.5)
nRBC: 0 % (ref 0.0–0.2)

## 2021-03-21 LAB — CBC WITH DIFFERENTIAL/PLATELET
Abs Immature Granulocytes: 0.26 10*3/uL — ABNORMAL HIGH (ref 0.00–0.07)
Basophils Absolute: 0 10*3/uL (ref 0.0–0.2)
Basophils Absolute: 0 10*3/uL (ref 0.0–0.1)
Basophils Relative: 0 %
Basos: 1 %
EOS (ABSOLUTE): 0.1 10*3/uL (ref 0.0–0.4)
Eos: 1 %
Eosinophils Absolute: 0 10*3/uL (ref 0.0–0.5)
Eosinophils Relative: 0 %
HCT: 34.4 % — ABNORMAL LOW (ref 36.0–46.0)
Hematocrit: 40.5 % (ref 34.0–46.6)
Hemoglobin: 13 g/dL (ref 11.1–15.9)
Hemoglobin: 11.2 g/dL — ABNORMAL LOW (ref 12.0–15.0)
Immature Grans (Abs): 0 10*3/uL (ref 0.0–0.1)
Immature Granulocytes: 0 %
Immature Granulocytes: 2 %
Lymphocytes Absolute: 1.9 10*3/uL (ref 0.7–3.1)
Lymphocytes Relative: 7 %
Lymphs Abs: 1.1 10*3/uL (ref 0.7–4.0)
Lymphs: 28 %
MCH: 29 pg (ref 26.6–33.0)
MCH: 28.8 pg (ref 26.0–34.0)
MCHC: 32.1 g/dL (ref 31.5–35.7)
MCHC: 32.6 g/dL (ref 30.0–36.0)
MCV: 90 fL (ref 79–97)
MCV: 88.4 fL (ref 80.0–100.0)
Monocytes Absolute: 0.5 10*3/uL (ref 0.1–0.9)
Monocytes Absolute: 0.6 10*3/uL (ref 0.1–1.0)
Monocytes Relative: 4 %
Monocytes: 8 %
Neutro Abs: 12.2 10*3/uL — ABNORMAL HIGH (ref 1.7–7.7)
Neutrophils Absolute: 4.3 10*3/uL (ref 1.4–7.0)
Neutrophils Relative %: 87 %
Neutrophils: 62 %
Platelets: 230 10*3/uL (ref 150–450)
Platelets: 243 10*3/uL (ref 150–400)
RBC: 4.49 x10E6/uL (ref 3.77–5.28)
RBC: 3.89 MIL/uL (ref 3.87–5.11)
RDW: 12.8 % (ref 11.7–15.4)
RDW: 13 % (ref 11.5–15.5)
WBC: 6.8 10*3/uL (ref 3.4–10.8)
WBC: 14.1 10*3/uL — ABNORMAL HIGH (ref 4.0–10.5)
nRBC: 0 % (ref 0.0–0.2)

## 2021-03-21 LAB — COMP. METABOLIC PANEL (12)

## 2021-03-21 LAB — BASIC METABOLIC PANEL
Anion gap: 9 (ref 5–15)
BUN: 17 mg/dL (ref 6–20)
CO2: 34 mmol/L — ABNORMAL HIGH (ref 22–32)
Calcium: 8.8 mg/dL — ABNORMAL LOW (ref 8.9–10.3)
Chloride: 94 mmol/L — ABNORMAL LOW (ref 98–111)
Creatinine, Ser: 0.61 mg/dL (ref 0.44–1.00)
GFR, Estimated: >60 mL/min (ref >60)
Glucose, Bld: 216 mg/dL — ABNORMAL HIGH (ref 70–99)
Potassium: 3.5 mmol/L (ref 3.5–5.1)
Sodium: 137 mmol/L (ref 135–145)

## 2021-03-21 LAB — LIPID PANEL

## 2021-03-21 LAB — TSH

## 2021-03-21 LAB — URINE CULTURE: Culture: >=100000 — AB

## 2021-03-21 LAB — VITAMIN D 25 HYDROXY (VIT D DEFICIENCY, FRACTURES): Vit D, 25-Hydroxy: 42.4 ng/mL (ref 30.0–100.0)

## 2021-03-21 MED ORDER — CEFAZOLIN SODIUM-DEXTROSE 1-4 GM/50ML-% IV SOLN
1.0000 g | Freq: Three times a day (TID) | INTRAVENOUS | Status: DC
  Administered 2021-03-21 – 2021-03-25 (×12): 1 g via INTRAVENOUS
  Filled 2021-03-21 (×16): qty 50

## 2021-03-21 MED ORDER — PREDNISONE 50 MG PO TABS
50.0000 mg | ORAL_TABLET | Freq: Every day | ORAL | Status: AC
  Administered 2021-03-22 – 2021-03-24 (×3): 50 mg via ORAL
  Filled 2021-03-21: qty 1
  Filled 2021-03-21: qty 2
  Filled 2021-03-21: qty 1

## 2021-03-21 MED ORDER — IPRATROPIUM-ALBUTEROL 0.5-2.5 (3) MG/3ML IN SOLN
3.0000 mL | Freq: Two times a day (BID) | RESPIRATORY_TRACT | Status: DC
  Administered 2021-03-21 – 2021-03-25 (×8): 3 mL via RESPIRATORY_TRACT
  Filled 2021-03-21 (×8): qty 3

## 2021-03-21 MED ORDER — PREDNISONE 5 MG PO TABS: 10.0000 mg | ORAL_TABLET | Freq: Every day | ORAL | Status: DC

## 2021-03-21 MED ORDER — PREDNISONE 5 MG PO TABS: 30.0000 mg | ORAL_TABLET | Freq: Every day | ORAL | Status: DC

## 2021-03-21 MED ORDER — PREDNISONE 20 MG PO TABS
40.0000 mg | ORAL_TABLET | Freq: Every day | ORAL | Status: DC
  Administered 2021-03-25: 40 mg via ORAL
  Filled 2021-03-21: qty 2

## 2021-03-21 MED ORDER — PREDNISONE 20 MG PO TABS: 20.0000 mg | ORAL_TABLET | Freq: Every day | ORAL | Status: DC

## 2021-03-21 MED ORDER — LIP MEDEX EX OINT
TOPICAL_OINTMENT | CUTANEOUS | Status: DC | PRN
  Filled 2021-03-21: qty 7

## 2021-03-21 MED ORDER — FUROSEMIDE 10 MG/ML IJ SOLN
40.0000 mg | Freq: Once | INTRAMUSCULAR | Status: AC
  Administered 2021-03-21: 40 mg via INTRAVENOUS
  Filled 2021-03-21: qty 4

## 2021-03-21 NOTE — Telephone Encounter (Signed)
Copied from [place] [id number]. Topic: General - Other >> Mar 20, 2021 10:29 AM Bayard Beaver wrote: Reason for [id number] called in requesting home health with nurse she is requesting a [name] [name]. Phone number for her is [phone]    Contact patient to obtain more information on her request. [name] [name] does not work for an [employer]. Patient stated she did not wish to go through an agency. Informed patient that she would need to use an agency in order to get services. She declined at this time. Nat Christen, CMA

## 2021-03-21 NOTE — Progress Notes (Signed)
[facility]  PROGRESS NOTE  [name] [name] [id number] DOB: 1961-02-15 DOA: 03/18/2021 PCP: Kerin Perna, NP   Brief HPI:   60 year old female with a history of intermittent asthma, alcohol use presented to ED on 03/18/2021 with shortness of breath.  Home COVID test was negative on 03/16/2021.  In the ED patient received albuterol treatment and Solu-Medrol 125 mg IV x1.  O2 sats were 89% on room air.  SARS-CoV-2 RT-PCR was negative.    Subjective   Still having cough. Dyspnea is better.   Assessment/Plan:     Acute on chronic hypercarbic respiratory failure -Significantly improved -Using BiPAP and at bedtime -She diuresed well with IV Lasix yesterday; will give 1 more dose of IV Lasix today -Continue  Mucinex 1200 mg p.o. twice daily; flutter valve every 4 hours while awake -Change Solu-Medrol to 40 mg IV every 12 hours -Continue BiPAP at bedtime and while sleeping -Continue bronchodilators, scheduled DuoNebs and as needed albuterol -Pulmicort was discontinued and Breo was started by pulmonology -She will need outpatient sleep study for sleep apnea -We will follow pulmonology as outpatient  Alcohol use -No signs and symptoms of alcohol withdrawal -Continue CIWA protocol with Ativan -Continue thiamine, folate  Obesity -BMI 42.65 kg/m -Likely has obesity hypoventilation syndrome and possibly undiagnosed OSA -Sleep study as outpatient  Hypertension -Blood pressure is fairly well controlled -Continue amlodipine  UTI -Urine culture from 03/19/2021 growing E. Coli -Sensitive to IV cefazolin -We will start IV Ancef    Scheduled medications:    amLODipine  10 mg Oral Daily   Chlorhexidine Gluconate Cloth  6 each Topical Daily   cholecalciferol  1,000 Units Oral Daily   diclofenac Sodium  4 g Topical BID   enoxaparin (LOVENOX) injection  40 mg Subcutaneous Q24H   FLUoxetine  20 mg Oral Daily   fluticasone furoate-vilanterol  1 puff Inhalation Daily    folic acid  1 mg Oral Daily   guaiFENesin  1,200 mg Oral BID   ipratropium-albuterol  3 mL Nebulization BID   loratadine  10 mg Oral Daily   multivitamin with minerals  1 tablet Oral Daily   naLOXone (NARCAN)  injection  0.4 mg Intravenous Once   [START ON 03/22/2021] predniSONE  50 mg Oral Q breakfast   Followed by   Derrill Memo ON 03/25/2021] predniSONE  40 mg Oral Q breakfast   Followed by   Derrill Memo ON 03/28/2021] predniSONE  30 mg Oral Q breakfast   Followed by   Derrill Memo ON 03/31/2021] predniSONE  20 mg Oral Q breakfast   Followed by   Derrill Memo ON 04/03/2021] predniSONE  10 mg Oral Q breakfast   thiamine  100 mg Oral Daily   Or   thiamine  100 mg Intravenous Daily         Data Reviewed:   CBG:  Recent Labs  Lab 03/19/21 0755  GLUCAP 200*    SpO2: 95 % O2 Flow Rate (L/min): 3 L/min FiO2 (%): 28 %    Vitals:   03/21/21 0800 03/21/21 0817 03/21/21 1232 03/21/21 1248  BP: (!) 150/89     Pulse: 92  (!) 103   Resp:   17   Temp:  97.7 F (36.5 C)  97.9 F (36.6 C)  TempSrc:  Oral  Axillary  SpO2: 97% 97% 95%   Weight:      Height:         Intake/Output Summary (Last 24 hours) at 03/21/2021 1432 Last data filed at 03/21/2021 0800 Gross  per 24 hour  Intake 496.58 ml  Output 2400 ml  Net -1903.42 ml    08/30 1901 - 09/01 0700 In: 395.2 [P.O.:300] Out: 3760 [Urine:3760]  Filed Weights   03/18/21 0642 03/19/21 0553 03/20/21 0500  Weight: 108.9 kg 109.2 kg 108.6 kg    CBC:  Recent Labs  Lab 03/18/21 0639 03/19/21 0526 03/20/21 0255 03/21/21 0316 03/21/21 1026  WBC 7.6 13.8* 18.8* 15.0* 14.1*  HGB 10.3* 11.2* 10.3* 10.8* 11.2*  HCT 32.4* 36.0 33.3* 33.6* 34.4*  PLT 193 276 236 240 243  MCV 92.6 94.0 94.6 89.8 88.4  MCH 29.4 29.2 29.3 28.9 28.8  MCHC 31.8 31.1 30.9 32.1 32.6  RDW 13.2 13.2 13.2 13.1 13.0  LYMPHSABS 1.0  --   --   --  1.1  MONOABS 0.3  --   --   --  0.6  EOSABS 0.1  --   --   --  0.0  BASOSABS 0.0  --   --   --  0.0    Complete metabolic  panel:  Recent Labs  Lab 03/18/21 0639 03/18/21 0702 03/18/21 1923 03/19/21 0526 03/19/21 0903 03/19/21 1115 03/19/21 1403 03/19/21 1657 03/20/21 0255 03/21/21 1026  NA 140  --  142 137  --   --   --   --  146* 137  K 2.6*  --  3.7 4.4  --   --   --  3.8 3.7 3.5  CL 98  --  100 98  --   --   --   --  101 94*  CO2 32  --  31 32  --   --   --   --  37* 34*  GLUCOSE 196*  --  187* 208*  --   --   --   --  167* 216*  BUN 8  --  13 13  --   --   --   --  18 17  CREATININE 0.56  --  0.68 0.56  --   --   --   --  0.62 0.61  CALCIUM 9.2  --  9.4 9.5  --   --   --   --  9.4 8.8*  AST 17  --   --   --   --   --   --   --   --   --   ALT 18  --   --   --   --   --   --   --   --   --   ALKPHOS 57  --   --   --   --   --   --   --   --   --   BILITOT 0.5  --   --   --   --   --   --   --   --   --   ALBUMIN 3.8  --   --   --   --   --   --   --   --   --   MG  --   --  1.7  --   --   --   --   --   --   --   PROCALCITON  --   --   --   --   --   --  <0.10  --   --   --   HGBA1C  --   --   --  5.8*  --   --   --   --   --   --   AMMONIA  --   --   --   --  88*  --   --   --   --   --   BNP  --  52.2  --   --   --  212.2*  --   --   --   --     No results for input(s): LIPASE, AMYLASE in the last 168 hours.  Recent Labs  Lab 03/18/21 0644 03/18/21 0702 03/19/21 1115 03/19/21 1403  BNP  --  52.2 212.2*  --   PROCALCITON  --   --   --  <0.10  SARSCOV2NAA NEGATIVE  --   --   --     ------------------------------------------------------------------------------------------------------------------ No results for input(s): CHOL, HDL, LDLCALC, TRIG, CHOLHDL, LDLDIRECT in the last 72 hours.  Lab Results  Component Value Date   HGBA1C 5.8 (H) 03/19/2021   ------------------------------------------------------------------------------------------------------------------ No results for input(s): TSH, T4TOTAL, T3FREE, THYROIDAB in the last 72 hours.  Invalid input(s):  FREET3 ------------------------------------------------------------------------------------------------------------------ No results for input(s): VITAMINB12, FOLATE, FERRITIN, TIBC, IRON, RETICCTPCT in the last 72 hours.  Coagulation profile No results for input(s): INR, PROTIME in the last 168 hours. No results for input(s): DDIMER in the last 72 hours.  Cardiac Enzymes No results for input(s): CKTOTAL, CKMB, CKMBINDEX, TROPONINI in the last 168 hours.  ------------------------------------------------------------------------------------------------------------------    Component Value Date/Time   BNP 212.2 (H) 03/19/2021 1115     Antibiotics: Anti-infectives (From admission, onward)    Start     Dose/Rate Route Frequency Ordered Stop   03/19/21 2200  Urelle (URELLE/URISED) 81 MG tablet 81 mg  Status:  Discontinued        1 tablet over 2 Days Oral 3 times daily 03/19/21 1728 03/19/21 1800   03/19/21 2200  Urelle (URELLE/URISED) 81 MG tablet 81 mg  Status:  Discontinued        1 tablet over 2 Days Oral 3 times daily 03/19/21 1800 03/21/21 0849   03/19/21 1400  Ampicillin-Sulbactam (UNASYN) 3 g in sodium chloride 0.9 % 100 mL IVPB  Status:  Discontinued        3 g 200 mL/hr over 30 Minutes Intravenous Every 6 hours 03/19/21 1229 03/21/21 0859        Radiology Reports  ECHOCARDIOGRAM COMPLETE  Result Date: 03/19/2021    ECHOCARDIOGRAM REPORT   Patient Name:   TOTIANA VASCO Date of Exam: 03/19/2021 Medical Rec #:  [id number]     Height:       63.0 in Accession #:    [id number]    Weight:       240.7 lb Date of Birth:  21-Feb-1961      BSA:          2.092 m Patient Age:    35 years      BP:           137/75 mmHg Patient Gender: F             HR:           102 bpm. Exam Location:  Inpatient Procedure: 2D Echo, Cardiac Doppler and Color Doppler Indications:    hypoxemia  History:        Patient has no prior history of Echocardiogram examinations.                 Risk  Factors:Hypertension.  Sonographer:  MH Referring Phys: [id number] Nason  1. Left ventricular ejection fraction, by estimation, is 60 to 65%. The left ventricle has normal function. The left ventricle has no regional wall motion abnormalities. There is moderate concentric left ventricular hypertrophy. Left ventricular diastolic parameters are consistent with Grade II diastolic dysfunction (pseudonormalization). Elevated left atrial pressure.  2. Right ventricular systolic function is normal. The right ventricular size is normal. There is mildly elevated pulmonary artery systolic pressure.  3. Left atrial size was moderately dilated.  4. The mitral valve is normal in structure. Mild mitral valve regurgitation. No evidence of mitral stenosis.  5. The aortic valve is tricuspid. Aortic valve regurgitation is mild. No aortic stenosis is present.  6. The inferior vena cava is dilated in size with <50% respiratory variability, suggesting right atrial pressure of 15 mmHg. FINDINGS  Left Ventricle: Left ventricular ejection fraction, by estimation, is 60 to 65%. The left ventricle has normal function. The left ventricle has no regional wall motion abnormalities. The left ventricular internal cavity size was normal in size. There is  moderate concentric left ventricular hypertrophy. Left ventricular diastolic parameters are consistent with Grade II diastolic dysfunction (pseudonormalization). Elevated left atrial pressure. Right Ventricle: The right ventricular size is normal. No increase in right ventricular wall thickness. Right ventricular systolic function is normal. There is mildly elevated pulmonary artery systolic pressure. The tricuspid regurgitant velocity is 2.42  m/s, and with an assumed right atrial pressure of 15 mmHg, the estimated right ventricular systolic pressure is [id number] mmHg. Left Atrium: Left atrial size was moderately dilated. Right Atrium: Right atrial size was normal in size.  Pericardium: There is no evidence of pericardial effusion. Mitral Valve: The mitral valve is normal in structure. Mild mitral valve regurgitation. No evidence of mitral valve stenosis. Tricuspid Valve: The tricuspid valve is normal in structure. Tricuspid valve regurgitation is trivial. No evidence of tricuspid stenosis. Aortic Valve: The aortic valve is tricuspid. Aortic valve regurgitation is mild. Aortic regurgitation PHT measures 276 msec. No aortic stenosis is present. Aortic valve mean gradient measures 6.0 mmHg. Aortic valve peak gradient measures 10.6 mmHg. Aortic valve area, by VTI measures 1.90 cm. Pulmonic Valve: The pulmonic valve was not well visualized. Pulmonic valve regurgitation is not visualized. No evidence of pulmonic stenosis. Aorta: The aortic root is normal in size and structure. Venous: The inferior vena cava is dilated in size with less than 50% respiratory variability, suggesting right atrial pressure of 15 mmHg. IAS/Shunts: No atrial level shunt detected by color flow Doppler.  LEFT VENTRICLE PLAX 2D LVIDd:         4.10 cm     Diastology LVIDs:         3.00 cm     LV e' medial:  7.07 cm/s LV PW:         1.60 cm     LV e' lateral: 4.90 cm/s LV IVS:        1.70 cm LVOT diam:     1.90 cm LV SV:         49 LV SV Index:   24 LVOT Area:     2.84 cm  LV Volumes (MOD) LV vol d, MOD A4C: 30.1 ml LV vol s, MOD A4C: 13.5 ml LV SV MOD A4C:     30.1 ml RIGHT VENTRICLE             IVC RV S prime:     15.40 cm/s  IVC diam: 2.60 cm TAPSE (M-mode):  2.4 cm LEFT ATRIUM           Index       RIGHT ATRIUM           Index LA diam:      4.50 cm 2.15 cm/m  RA Area:     13.40 cm LA Vol (A2C): 37.6 ml 17.97 ml/m RA Volume:   27.80 ml  13.29 ml/m LA Vol (A4C): 31.0 ml 14.82 ml/m  AORTIC VALVE AV Area (Vmax):    1.83 cm AV Area (Vmean):   1.80 cm AV Area (VTI):     1.90 cm AV Vmax:           163.00 cm/s AV Vmean:          112.000 cm/s AV VTI:            0.259 m AV Peak Grad:      10.6 mmHg AV Mean Grad:       6.0 mmHg LVOT Vmax:         105.00 cm/s LVOT Vmean:        71.100 cm/s LVOT VTI:          0.174 m LVOT/AV VTI ratio: 0.67 AI PHT:  AORTA Ao Root diam: 3.40 cm Ao Asc diam:  3.30 cm MITRAL VALVE               TRICUSPID VALVE MV Area (PHT): 5.64 cm    TR Peak grad:   23.4 mmHg MV A velocity: 85.90 cm/s  TR Vmax:        242.00 cm/s                             SHUNTS                            Systemic VTI:  0.17 m                            Systemic Diam: 1.90 cm Mihai Croitoru MD Electronically signed by Sanda Klein MD Signature Date/Time: 03/19/2021/5:14:21 PM    Final       DVT prophylaxis: Lovenox  Code Status: Full code  Family Communication: No family at bedside   Consultants: PCCM  Procedures:     Objective    Physical Examination:   General-appears in no acute distress Heart-S1-S2, regular, no murmur auscultated Lungs-bibasilar crackles Abdomen-soft, nontender, no organomegaly Extremities-no edema in the lower extremities Neuro-alert, oriented x3, no focal deficit noted   Status is: Inpatient  Dispo: The patient is from: Home              Anticipated d/c is to: Home              Anticipated d/c date is: 03/21/2021              Patient currently not stable for discharge  Barrier to discharge-ongoing management for acute hypercarbic respiratory failure  COVID-19 Labs  No results for input(s): DDIMER, FERRITIN, LDH, CRP in the last 72 hours.  Lab Results  Component Value Date   [place] NEGATIVE 03/18/2021    Microbiology  Recent Results (from the past 240 hour(s))  Resp Panel by RT-PCR (Flu A&B, Covid) Nasopharyngeal Swab     Status: None   Collection Time: 03/18/21  6:44 AM   Specimen: Nasopharyngeal Swab; Nasopharyngeal(NP) swabs in vial  transport medium  Result Value Ref Range Status   SARS Coronavirus 2 by RT PCR NEGATIVE NEGATIVE Final    Comment: (NOTE) SARS-CoV-2 target nucleic acids are NOT DETECTED.  The SARS-CoV-2 RNA is generally  detectable in upper respiratory specimens during the acute phase of infection. The lowest concentration of SARS-CoV-2 viral copies this assay can detect is 138 copies/mL. A negative result does not preclude SARS-Cov-2 infection and should not be used as the sole basis for treatment or other patient management decisions. A negative result may occur with  improper specimen collection/handling, submission of specimen other than nasopharyngeal swab, presence of viral mutation(s) within the areas targeted by this assay, and inadequate number of viral copies(<138 copies/mL). A negative result must be combined with clinical observations, patient history, and epidemiological information. The expected result is Negative.  Fact Sheet for Patients:  EntrepreneurPulse.com.au  Fact Sheet for Healthcare Providers:  IncredibleEmployment.be  This test is no t yet approved or cleared by the [place] FDA and  has been authorized for detection and/or diagnosis of SARS-CoV-2 by FDA under an Emergency Use Authorization (EUA). This EUA will remain  in effect (meaning this test can be used) for the duration of the COVID-19 declaration under Section 564(b)(1) of the Act, 21 U.S.C.section 360bbb-3(b)(1), unless the authorization is terminated  or revoked sooner.       Influenza A by PCR NEGATIVE NEGATIVE Final   Influenza B by PCR NEGATIVE NEGATIVE Final    Comment: (NOTE) The Xpert Xpress SARS-CoV-2/FLU/RSV plus assay is intended as an aid in the diagnosis of influenza from Nasopharyngeal swab specimens and should not be used as a sole basis for treatment. Nasal washings and aspirates are unacceptable for Xpert Xpress SARS-CoV-2/FLU/RSV testing.  Fact Sheet for Patients: EntrepreneurPulse.com.au  Fact Sheet for Healthcare Providers: IncredibleEmployment.be  This test is not yet approved or cleared by the [place] FDA  and has been authorized for detection and/or diagnosis of SARS-CoV-2 by FDA under an Emergency Use Authorization (EUA). This EUA will remain in effect (meaning this test can be used) for the duration of the COVID-19 declaration under Section 564(b)(1) of the Act, 21 U.S.C. section 360bbb-3(b)(1), unless the authorization is terminated or revoked.  Performed at [facility], [place] [address], [place], [place] [address]   Culture, blood (routine x 2)     Status: None (Preliminary result)   Collection Time: 03/19/21  1:55 PM   Specimen: BLOOD  Result Value Ref Range Status   Specimen Description   Final    BLOOD RIGHT ANTECUBITAL Performed at [facility] [address], [place], [place] [address]    Special Requests   Final    BOTTLES DRAWN AEROBIC AND ANAEROBIC Blood Culture adequate volume Performed at [facility] [address] [place], [place] [address]    Culture   Final    NO GROWTH 2 DAYS Performed at [facility] [address] [place], [place] [address]    Report Status PENDING  Incomplete  Culture, blood (routine x 2)     Status: None (Preliminary result)   Collection Time: 03/19/21  2:03 PM   Specimen: BLOOD  Result Value Ref Range Status   Specimen Description   Final    BLOOD LEFT ANTECUBITAL Performed at [facility] [address] [place], [place] [address]    Special Requests   Final    BOTTLES DRAWN AEROBIC AND ANAEROBIC Blood Culture adequate volume Performed at  [facility], [place] [address] [place], [place] [address]    Culture   Final    NO GROWTH 2 DAYS Performed at [facility] [address], [place], [place] [address]    Report Status PENDING  Incomplete  Urine Culture     Status: Abnormal   Collection Time: 03/19/21  2:51 PM   Specimen: Urine, Clean Catch  Result Value Ref Range Status   Specimen Description   Final    URINE, CLEAN  CATCH Performed at [facility], [place] [address] [place], [place] [address]    Special Requests   Final    NONE Performed at [facility], [place] [address] [place],  [address]    Culture >=100,000 COLONIES/mL ESCHERICHIA COLI (A)  Final   Report Status 03/21/2021 FINAL  Final   Organism ID, Bacteria ESCHERICHIA COLI (A)  Final      Susceptibility   Escherichia coli - MIC*    AMPICILLIN >=32 RESISTANT Resistant     CEFAZOLIN <=4 SENSITIVE Sensitive     CEFEPIME <=0.12 SENSITIVE Sensitive     CEFTRIAXONE <=0.25 SENSITIVE Sensitive     CIPROFLOXACIN <=0.25 SENSITIVE Sensitive     GENTAMICIN <=1 SENSITIVE Sensitive     IMIPENEM <=0.25 SENSITIVE Sensitive     NITROFURANTOIN <=16 SENSITIVE Sensitive     TRIMETH/SULFA <=20 SENSITIVE Sensitive     AMPICILLIN/SULBACTAM >=32 RESISTANT Resistant     PIP/TAZO <=4 SENSITIVE Sensitive     * >=100,000 COLONIES/mL ESCHERICHIA COLI  MRSA Next Gen by PCR, Nasal     Status: None   Collection Time: 03/19/21  5:03 PM   Specimen: Nasal Mucosa; Nasal Swab  Result Value Ref Range Status   MRSA by PCR Next Gen NOT DETECTED NOT DETECTED Final    Comment: (NOTE) The GeneXpert MRSA Assay (FDA approved for NASAL specimens only), is one component of a comprehensive MRSA colonization surveillance program. It is not intended to diagnose MRSA infection nor to guide or monitor treatment for MRSA infections. Test performance is not FDA approved in patients less than 34 years old. Performed at [facility], [place] [address] [place],  [address]          Oswald Hillock   [facility] If 7PM-7AM, please contact night-coverage at [email or web address], Office  [phone]   03/21/2021, 2:32 PM  LOS: 2 days

## 2021-03-21 NOTE — Consult Note (Signed)
NAME:  [name] [name], MRN:  [id number], DOB:  03/03/1961, LOS: 2 ADMISSION DATE:  03/18/2021, CONSULTATION DATE:  03/19/21 REFERRING MD:  Tana Coast, MD CHIEF COMPLAINT:  Respiratory failure   History of Present Illness:  60 year old female with asthma admitted for asthma exacerbation and transferred to the ICU for altered mental status secondary to acute on chronic hypercarbic respiratory failure.  Patient unable to participate in interview due to obtunded status. History obtained per chart review. Two weeks ago, she had sore throat, muscle aches and joint pain followed five days later with shortness of breath and wheezing. Home covid-19 test negative. Shortness of breath and wheezing worsened. In the ED she was treated with albuterol and IV steroids. Found to be hypoxemic to 89% and started on supplemental oxygen. This morning on evaluation she had altered mental status. ABG consistent with acute on chronic hypercarbic respiratory failure. 7/152/109/80.6. CXR with increased vascular congestion compared to admission. Transferred to SDU to start BiPAP. PCCM consulted.  Pertinent  Medical History  Asthma, seasonal allergies, etoh abuse  Significant Hospital Events: Including procedures, antibiotic start and stop dates in addition to other pertinent events   8/29 Admitted to [facility] 8/30 Transferred to SDU for BiPAP for respiratory acidosis. PCCM consulted 8/31 Tolerated BiPAP with improved mental status 9/1 Tolerating BiPAP  Interim History / Subjective:  Tolerating BiPAP. Doing well. On nasal cannula. Denies history or work-up of OSA. Son previously reported history of witnessed apnea. She would be interested in a sleep study in the future  Objective   Blood pressure (!) 150/89, pulse 92, temperature 97.7 F (36.5 C), temperature source Oral, resp. rate (!) 21, height '5\' 3"'$  (1.6 m), weight 108.6 kg, SpO2 97 %.    FiO2 (%):  [28 %] 28 %   Intake/Output Summary (Last 24 hours) at 03/21/2021 0958 Last  data filed at 03/21/2021 0800 Gross per 24 hour  Intake 696.58 ml  Output 2660 ml  Net -1963.42 ml   Filed Weights   03/18/21 0642 03/19/21 0553 03/20/21 0500  Weight: 108.9 kg 109.2 kg 108.6 kg   Physical Exam: General: Obese, well-appearing, no acute distress HENT: [place], AT, OP clear, MMM Eyes: EOMI, no scleral icterus Respiratory: Clear to auscultation bilaterally.  No crackles, wheezing or rales Cardiovascular: RRR, -M/R/G, no JVD Extremities:-Edema,-tenderness Neuro: AAO x4, CNII-XII grossly intact  ABG    Component Value Date/Time   PHART 7.364 03/19/2021 1508   PCO2ART 65.3 (HH) 03/19/2021 1508   PO2ART 80.0 (L) 03/19/2021 1508   HCO3 36.3 (H) 03/19/2021 1508   TCO2 28 01/01/2011 1532   O2SAT 95.7 03/19/2021 1508   Improved leukocytosis Stable anemia  Echo [id number] Grade II diastolic dysfunction. EF 60-65%. Mildly elevated PASP  CXR 8/30 - Cardiomegaly. Increased vascular congestion. Left lower lobe atelectasis vs consolidation  Resolved Hospital Problem list     Assessment & Plan:   Asthma exacerbation - improving Acute on chronic hypercarbic respiratory failure secondary to suspected undiagnosed OSA - witnessed apnea per family, no prior sleep study Acute diastolic heart failure --BiPAP nightly --Wean supplemental oxygen for goal SpO2 >88% --Defer further diuresis to primary team. BMET ordered to monitor K and Cr --Continue bronchodilators: scheduled Duonebs and PRN albuterol --Continue Breo. Can resume home Symbicort on discharge however formula not available while inpatient. --Prednisone taper ordered --IgE and CBC with diff ordered --Minimize sedating medications --Will need to obtain outpatient sleep study  --Will arrange Pulmonary follow-up for bronchodilator management and sleep study  EtOH abuse --  CIWA --Folic acid, multivitamin  Pulmonary will sign off  Best Practice (right click and "Reselect all SmartList Selections" daily)   Diet/type:  NPO DVT prophylaxis: LMWH GI prophylaxis: N/A Lines: N/A Foley:  N/A Code Status:  full code Last date of multidisciplinary goals of care discussion [8/30] Full code - confirmed with son   Labs   CBC: Recent Labs  Lab 03/18/21 0639 03/19/21 0526 03/20/21 0255 03/21/21 0316  WBC 7.6 13.8* 18.8* 15.0*  NEUTROABS 6.2  --   --   --   HGB 10.3* 11.2* 10.3* 10.8*  HCT 32.4* 36.0 33.3* 33.6*  MCV 92.6 94.0 94.6 89.8  PLT 193 276 236 [id number]    Basic Metabolic Panel: Recent Labs  Lab 03/18/21 0639 03/18/21 1923 03/19/21 0526 03/19/21 1657 03/20/21 0255  NA 140 142 137  --  146*  K 2.6* 3.7 4.4 3.8 3.7  CL 98 100 98  --  101  CO2 32 31 32  --  37*  GLUCOSE 196* 187* 208*  --  167*  BUN '8 13 13  '$ --  18  CREATININE 0.56 0.68 0.56  --  0.62  CALCIUM 9.2 9.4 9.5  --  9.4  MG  --  1.7  --   --   --    GFR: Estimated Creatinine Clearance: 88.4 mL/min (by C-G formula based on SCr of 0.62 mg/dL). Recent Labs  Lab 03/18/21 0639 03/19/21 0526 03/19/21 1403 03/20/21 0255 03/21/21 0316  PROCALCITON  --   --  <0.10  --   --   WBC 7.6 13.8*  --  18.8* 15.0*    Liver Function Tests: Recent Labs  Lab 03/18/21 0639  AST 17  ALT 18  ALKPHOS 57  BILITOT 0.5  PROT 7.4  ALBUMIN 3.8   No results for input(s): LIPASE, AMYLASE in the last 168 hours. Recent Labs  Lab 03/19/21 0903  AMMONIA 88*    ABG    Component Value Date/Time   PHART 7.364 03/19/2021 1508   PCO2ART 65.3 (HH) 03/19/2021 1508   PO2ART 80.0 (L) 03/19/2021 1508   HCO3 36.3 (H) 03/19/2021 1508   TCO2 28 01/01/2011 1532   O2SAT 95.7 03/19/2021 1508     Coagulation Profile: No results for input(s): INR, PROTIME in the last 168 hours.  Cardiac Enzymes: No results for input(s): CKTOTAL, CKMB, CKMBINDEX, TROPONINI in the last 168 hours.  HbA1C: Hgb A1c MFr Bld  Date/Time Value Ref Range Status  03/19/2021 05:26 AM 5.8 (H) 4.8 - 5.6 % Final    Comment:    (NOTE)         Prediabetes: 5.7 - 6.4          Diabetes: >6.4         Glycemic control for adults with diabetes: <7.0   02/10/2019 10:45 AM 5.3 4.8 - 5.6 % Final    Comment:             Prediabetes: 5.7 - 6.4          Diabetes: >6.4          Glycemic control for adults with diabetes: <7.0     CBG: Recent Labs  Lab 03/19/21 0755  GLUCAP 200*    Care time: 57 min    Rodman Pickle, M.D. [facility] Pulmonary/Critical Care Medicine 03/21/2021 9:59 AM   See Shea Evans for personal pager For hours between 7 PM to 7 AM, please call Elink for urgent questions

## 2021-03-22 LAB — BASIC METABOLIC PANEL
Anion gap: 9 (ref 5–15)
BUN: 14 mg/dL (ref 6–20)
CO2: 35 mmol/L — ABNORMAL HIGH (ref 22–32)
Calcium: 8.4 mg/dL — ABNORMAL LOW (ref 8.9–10.3)
Chloride: 92 mmol/L — ABNORMAL LOW (ref 98–111)
Creatinine, Ser: 0.48 mg/dL (ref 0.44–1.00)
GFR, Estimated: >60 mL/min (ref >60)
Glucose, Bld: 154 mg/dL — ABNORMAL HIGH (ref 70–99)
Potassium: 3.1 mmol/L — ABNORMAL LOW (ref 3.5–5.1)
Sodium: 136 mmol/L (ref 135–145)

## 2021-03-22 LAB — CBC
HCT: 32.4 % — ABNORMAL LOW (ref 36.0–46.0)
Hemoglobin: 10.9 g/dL — ABNORMAL LOW (ref 12.0–15.0)
MCH: 29.1 pg (ref 26.0–34.0)
MCHC: 33.6 g/dL (ref 30.0–36.0)
MCV: 86.4 fL (ref 80.0–100.0)
Platelets: 267 10*3/uL (ref 150–400)
RBC: 3.75 MIL/uL — ABNORMAL LOW (ref 3.87–5.11)
RDW: 12.6 % (ref 11.5–15.5)
WBC: 15.4 10*3/uL — ABNORMAL HIGH (ref 4.0–10.5)
nRBC: 0 % (ref 0.0–0.2)

## 2021-03-22 LAB — BRAIN NATRIURETIC PEPTIDE: B Natriuretic Peptide: 109.3 pg/mL — ABNORMAL HIGH (ref 0.0–100.0)

## 2021-03-22 MED ORDER — POTASSIUM CHLORIDE 10 MEQ/100ML IV SOLN
10.0000 meq | INTRAVENOUS | Status: AC
  Administered 2021-03-22 (×4): 10 meq via INTRAVENOUS
  Filled 2021-03-22 (×4): qty 100

## 2021-03-22 MED ORDER — FUROSEMIDE 10 MG/ML IJ SOLN: 40.0000 mg | Freq: Once | INTRAMUSCULAR | Status: DC

## 2021-03-22 MED ORDER — FUROSEMIDE 10 MG/ML IJ SOLN
40.0000 mg | Freq: Two times a day (BID) | INTRAMUSCULAR | Status: DC
  Administered 2021-03-22 – 2021-03-25 (×7): 40 mg via INTRAVENOUS
  Filled 2021-03-22 (×7): qty 4

## 2021-03-22 NOTE — Progress Notes (Signed)
Report given to Joe RN on [facility] - Pt transported to unit via w/c without c/o of pain, discomfort or respiratory distress.

## 2021-03-22 NOTE — Progress Notes (Signed)
[facility]  PROGRESS NOTE  [name] [name] [id number] DOB: 1961-03-26 DOA: 03/18/2021 PCP: Kerin Perna, NP   Brief HPI:   60 year old female with a history of intermittent asthma, alcohol use presented to ED on 03/18/2021 with shortness of breath.  Home COVID test was negative on 03/16/2021.  In the ED patient received albuterol treatment and Solu-Medrol 125 mg IV x1.  O2 sats were 89% on room air.  SARS-CoV-2 RT-PCR was negative.    Subjective   Breathing has improved, excellent diuresis with Lasix.  Started on IV cefazolin for UTI yesterday.   Assessment/Plan:     Acute on chronic hypercarbic respiratory failure -Significantly improved -Using BiPAP and at bedtime may be going for urinary -Continue  Mucinex 1200 mg p.o. twice daily; flutter valve every 4 hours while awake -Change Solu-Medrol to 40 mg IV every 12 hours; will switch her to p.o. prednisone 50 mg, tapering dose -Continue BiPAP at bedtime and while sleeping -Continue bronchodilators, scheduled DuoNebs and as needed albuterol -Pulmicort was discontinued and Breo was started by pulmonology -She will need outpatient sleep study for sleep apnea -Will follow pulmonology as outpatient  Acute on chronic diastolic CHF -Patient's chest x-ray showed early CHF, interstitial edema-diuresing well with IV Lasix. -Echocardiogram shows EF of 123456, grade 2 diastolic dysfunction -Continue IV Lasix 40 mg every 12 hours -Check BNP   Alcohol use -No signs and symptoms of alcohol withdrawal -Continue CIWA protocol with Ativan -Continue thiamine, folate  Obesity -BMI 42.65 kg/m -Likely has obesity hypoventilation syndrome and possibly undiagnosed OSA -Sleep study as outpatient  Hypertension -Blood pressure is fairly well controlled -Continue amlodipine  Hypokalemia -Potassium was 3.1 today -Replace IV KCl 10 mEq x 4 -Follow BMP in am  UTI -Urine culture from 03/19/2021 growing E. Coli -Sensitive to IV  cefazolin -Patient started on  IV Ancef    Scheduled medications:    amLODipine  10 mg Oral Daily   Chlorhexidine Gluconate Cloth  6 each Topical Daily   cholecalciferol  1,000 Units Oral Daily   diclofenac Sodium  4 g Topical BID   enoxaparin (LOVENOX) injection  40 mg Subcutaneous Q24H   FLUoxetine  20 mg Oral Daily   fluticasone furoate-vilanterol  1 puff Inhalation Daily   folic acid  1 mg Oral Daily   furosemide  40 mg Intravenous Once   guaiFENesin  1,200 mg Oral BID   ipratropium-albuterol  3 mL Nebulization BID   loratadine  10 mg Oral Daily   multivitamin with minerals  1 tablet Oral Daily   naLOXone (NARCAN)  injection  0.4 mg Intravenous Once   predniSONE  50 mg Oral Q breakfast   Followed by   Derrill Memo ON 03/25/2021] predniSONE  40 mg Oral Q breakfast   Followed by   Derrill Memo ON 03/28/2021] predniSONE  30 mg Oral Q breakfast   Followed by   Derrill Memo ON 03/31/2021] predniSONE  20 mg Oral Q breakfast   Followed by   Derrill Memo ON 04/03/2021] predniSONE  10 mg Oral Q breakfast   thiamine  100 mg Oral Daily   Or   thiamine  100 mg Intravenous Daily         Data Reviewed:   CBG:  Recent Labs  Lab 03/19/21 0755  GLUCAP 200*    SpO2: 93 % O2 Flow Rate (L/min): 3 L/min FiO2 (%): 28 %    Vitals:   03/22/21 0100 03/22/21 0400 03/22/21 0500 03/22/21 0600  BP:  140/71 125/71 (!) 147/85  Pulse: 86 77 80 81  Resp: 18 (!) 21    Temp:  98.4 F (36.9 C)    TempSrc:  Oral    SpO2: 99% 94% 97% 93%  Weight:      Height:         Intake/Output Summary (Last 24 hours) at 03/22/2021 0813 Last data filed at 03/22/2021 0000 Gross per 24 hour  Intake 45.14 ml  Output 1000 ml  Net -954.86 ml    08/31 1901 - 09/02 0700 In: 541.7  Out: 2200 [Urine:2200]  Filed Weights   03/18/21 0642 03/19/21 0553 03/20/21 0500  Weight: 108.9 kg 109.2 kg 108.6 kg    CBC:  Recent Labs  Lab 03/18/21 0639 03/19/21 0526 03/20/21 0255 03/21/21 0316 03/21/21 1026 03/22/21 0300  WBC  7.6 13.8* 18.8* 15.0* 14.1* 15.4*  HGB 10.3* 11.2* 10.3* 10.8* 11.2* 10.9*  HCT 32.4* 36.0 33.3* 33.6* 34.4* 32.4*  PLT 193 276 236 240 243 267  MCV 92.6 94.0 94.6 89.8 88.4 86.4  MCH 29.4 29.2 29.3 28.9 28.8 29.1  MCHC 31.8 31.1 30.9 32.1 32.6 33.6  RDW 13.2 13.2 13.2 13.1 13.0 12.6  LYMPHSABS 1.0  --   --   --  1.1  --   MONOABS 0.3  --   --   --  0.6  --   EOSABS 0.1  --   --   --  0.0  --   BASOSABS 0.0  --   --   --  0.0  --     Complete metabolic panel:  Recent Labs  Lab 03/18/21 0639 03/18/21 0702 03/18/21 1923 03/19/21 0526 03/19/21 0903 03/19/21 1115 03/19/21 1403 03/19/21 1657 03/20/21 0255 03/21/21 1026 03/22/21 0300  NA 140  --  142 137  --   --   --   --  146* 137 136  K 2.6*  --  3.7 4.4  --   --   --  3.8 3.7 3.5 3.1*  CL 98  --  100 98  --   --   --   --  101 94* 92*  CO2 32  --  31 32  --   --   --   --  37* 34* 35*  GLUCOSE 196*  --  187* 208*  --   --   --   --  167* 216* 154*  BUN 8  --  13 13  --   --   --   --  '18 17 14  '$ CREATININE 0.56  --  0.68 0.56  --   --   --   --  0.62 0.61 0.48  CALCIUM 9.2  --  9.4 9.5  --   --   --   --  9.4 8.8* 8.4*  AST 17  --   --   --   --   --   --   --   --   --   --   ALT 18  --   --   --   --   --   --   --   --   --   --   ALKPHOS 57  --   --   --   --   --   --   --   --   --   --   BILITOT 0.5  --   --   --   --   --   --   --   --   --   --  ALBUMIN 3.8  --   --   --   --   --   --   --   --   --   --   MG  --   --  1.7  --   --   --   --   --   --   --   --   PROCALCITON  --   --   --   --   --   --  <0.10  --   --   --   --   HGBA1C  --   --   --  5.8*  --   --   --   --   --   --   --   AMMONIA  --   --   --   --  88*  --   --   --   --   --   --   BNP  --  52.2  --   --   --  212.2*  --   --   --   --   --     No results for input(s): LIPASE, AMYLASE in the last 168 hours.  Recent Labs  Lab 03/18/21 0644 03/18/21 0702 03/19/21 1115 03/19/21 1403  BNP  --  52.2 212.2*  --   PROCALCITON  --   --    --  <0.10  SARSCOV2NAA NEGATIVE  --   --   --     ------------------------------------------------------------------------------------------------------------------ No results for input(s): CHOL, HDL, LDLCALC, TRIG, CHOLHDL, LDLDIRECT in the last 72 hours.  Lab Results  Component Value Date   HGBA1C 5.8 (H) 03/19/2021   ------------------------------------------------------------------------------------------------------------------ No results for input(s): TSH, T4TOTAL, T3FREE, THYROIDAB in the last 72 hours.  Invalid input(s): FREET3 ------------------------------------------------------------------------------------------------------------------ No results for input(s): VITAMINB12, FOLATE, FERRITIN, TIBC, IRON, RETICCTPCT in the last 72 hours.  Coagulation profile No results for input(s): INR, PROTIME in the last 168 hours. No results for input(s): DDIMER in the last 72 hours.  Cardiac Enzymes No results for input(s): CKTOTAL, CKMB, CKMBINDEX, TROPONINI in the last 168 hours.  ------------------------------------------------------------------------------------------------------------------    Component Value Date/Time   BNP 212.2 (H) 03/19/2021 1115     Antibiotics: Anti-infectives (From admission, onward)    Start     Dose/Rate Route Frequency Ordered Stop   03/21/21 1530  ceFAZolin (ANCEF) IVPB 1 g/50 mL premix        1 g 100 mL/hr over 30 Minutes Intravenous Every 8 hours 03/21/21 1435     03/19/21 2200  Urelle (URELLE/URISED) 81 MG tablet 81 mg  Status:  Discontinued        1 tablet over 2 Days Oral 3 times daily 03/19/21 1728 03/19/21 1800   03/19/21 2200  Urelle (URELLE/URISED) 81 MG tablet 81 mg  Status:  Discontinued        1 tablet over 2 Days Oral 3 times daily 03/19/21 1800 03/21/21 0849   03/19/21 1400  Ampicillin-Sulbactam (UNASYN) 3 g in sodium chloride 0.9 % 100 mL IVPB  Status:  Discontinued        3 g 200 mL/hr over 30 Minutes Intravenous Every 6  hours 03/19/21 1229 03/21/21 0859        Radiology Reports  No results found.    DVT prophylaxis: Lovenox  Code Status: Full code  Family Communication: No family at bedside   Consultants: PCCM  Procedures:     Objective    Physical Examination:  General-appears in no acute distress Heart-S1-S2, regular, no murmur auscultated Lungs-clear to auscultation bilaterally, no wheezing or crackles auscultated Abdomen-soft, nontender, no organomegaly Extremities-no edema in the lower extremities Neuro-alert, oriented x3, no focal deficit noted   Status is: Inpatient  Dispo: The patient is from: Home              Anticipated d/c is to: Home              Anticipated d/c date is: 03/24/2021              Patient currently not stable for discharge  Barrier to discharge-ongoing management for acute hypercarbic respiratory failure  COVID-19 Labs  No results for input(s): DDIMER, FERRITIN, LDH, CRP in the last 72 hours.  Lab Results  Component Value Date   [place] NEGATIVE 03/18/2021    Microbiology  Recent Results (from the past 240 hour(s))  Resp Panel by RT-PCR (Flu A&B, Covid) Nasopharyngeal Swab     Status: None   Collection Time: 03/18/21  6:44 AM   Specimen: Nasopharyngeal Swab; Nasopharyngeal(NP) swabs in vial transport medium  Result Value Ref Range Status   SARS Coronavirus 2 by RT PCR NEGATIVE NEGATIVE Final    Comment: (NOTE) SARS-CoV-2 target nucleic acids are NOT DETECTED.  The SARS-CoV-2 RNA is generally detectable in upper respiratory specimens during the acute phase of infection. The lowest concentration of SARS-CoV-2 viral copies this assay can detect is 138 copies/mL. A negative result does not preclude SARS-Cov-2 infection and should not be used as the sole basis for treatment or other patient management decisions. A negative result may occur with  improper specimen collection/handling, submission of specimen other than nasopharyngeal  swab, presence of viral mutation(s) within the areas targeted by this assay, and inadequate number of viral copies(<138 copies/mL). A negative result must be combined with clinical observations, patient history, and epidemiological information. The expected result is Negative.  Fact Sheet for Patients:  EntrepreneurPulse.com.au  Fact Sheet for Healthcare Providers:  IncredibleEmployment.be  This test is no t yet approved or cleared by the [place] FDA and  has been authorized for detection and/or diagnosis of SARS-CoV-2 by FDA under an Emergency Use Authorization (EUA). This EUA will remain  in effect (meaning this test can be used) for the duration of the COVID-19 declaration under Section 564(b)(1) of the Act, 21 U.S.C.section 360bbb-3(b)(1), unless the authorization is terminated  or revoked sooner.       Influenza A by PCR NEGATIVE NEGATIVE Final   Influenza B by PCR NEGATIVE NEGATIVE Final    Comment: (NOTE) The Xpert Xpress SARS-CoV-2/FLU/RSV plus assay is intended as an aid in the diagnosis of influenza from Nasopharyngeal swab specimens and should not be used as a sole basis for treatment. Nasal washings and aspirates are unacceptable for Xpert Xpress SARS-CoV-2/FLU/RSV testing.  Fact Sheet for Patients: EntrepreneurPulse.com.au  Fact Sheet for Healthcare Providers: IncredibleEmployment.be  This test is not yet approved or cleared by the [place] FDA and has been authorized for detection and/or diagnosis of SARS-CoV-2 by FDA under an Emergency Use Authorization (EUA). This EUA will remain in effect (meaning this test can be used) for the duration of the COVID-19 declaration under Section 564(b)(1) of the Act, 21 U.S.C. section 360bbb-3(b)(1), unless the authorization is terminated or revoked.  Performed at [facility], [place] [address] [place], [place] [address]    Culture, blood (routine x 2)     Status: None (Preliminary result)   Collection Time: 03/19/21  1:55 PM   Specimen: BLOOD  Result Value Ref Range Status   Specimen Description   Final    BLOOD RIGHT ANTECUBITAL Performed at [facility] [address] [place], [place] [address]    Special Requests   Final    BOTTLES DRAWN AEROBIC AND ANAEROBIC Blood Culture adequate volume Performed at [facility] [address] [place], [place] [address]    Culture   Final    NO GROWTH 3 DAYS Performed at [facility], [place] [address], [place], [place] [address]    Report Status PENDING  Incomplete  Culture, blood (routine x 2)     Status: None (Preliminary result)   Collection Time: 03/19/21  2:03 PM   Specimen: BLOOD  Result Value Ref Range Status   Specimen Description   Final    BLOOD LEFT ANTECUBITAL Performed at [facility] [address], [place], [place] [address]    Special Requests   Final    BOTTLES DRAWN AEROBIC AND ANAEROBIC Blood Culture adequate volume Performed at [facility] [address], [place], [place] [address]    Culture   Final    NO GROWTH 3 DAYS Performed at [facility], [place] [address], [place], [place] [address]    Report Status PENDING  Incomplete  Urine Culture     Status: Abnormal   Collection Time: 03/19/21  2:51 PM   Specimen: Urine, Clean Catch  Result Value Ref Range Status   Specimen Description   Final    URINE, CLEAN CATCH Performed at [facility], [place] [address] [place], [place] [address]    Special Requests   Final    NONE Performed at [facility], [place] [address], [place], [place] [address]    Culture >=100,000 COLONIES/mL ESCHERICHIA COLI (A)  Final   Report Status 03/21/2021 FINAL  Final   Organism ID, Bacteria ESCHERICHIA COLI (A)  Final      Susceptibility   Escherichia coli - MIC*    AMPICILLIN >=32  RESISTANT Resistant     CEFAZOLIN <=4 SENSITIVE Sensitive     CEFEPIME <=0.12 SENSITIVE Sensitive     CEFTRIAXONE <=0.25 SENSITIVE Sensitive     CIPROFLOXACIN <=0.25 SENSITIVE Sensitive     GENTAMICIN <=1 SENSITIVE Sensitive     IMIPENEM <=0.25 SENSITIVE Sensitive     NITROFURANTOIN <=16 SENSITIVE Sensitive     TRIMETH/SULFA <=20 SENSITIVE Sensitive     AMPICILLIN/SULBACTAM >=32 RESISTANT Resistant     PIP/TAZO <=4 SENSITIVE Sensitive     * >=100,000 COLONIES/mL ESCHERICHIA COLI  MRSA Next Gen by PCR, Nasal     Status: None   Collection Time: 03/19/21  5:03 PM   Specimen: Nasal Mucosa; Nasal Swab  Result Value Ref Range Status   MRSA by PCR Next Gen NOT DETECTED NOT DETECTED Final    Comment: (NOTE) The GeneXpert MRSA Assay (FDA approved for NASAL specimens only), is one component of a comprehensive MRSA colonization surveillance program. It is not intended to diagnose MRSA infection nor to guide or monitor treatment for MRSA infections. Test performance is not FDA approved in patients less than 41 years old. Performed at [facility], [place] [address], [name], [place] [address]          Oswald Hillock   [facility] If 7PM-7AM, please contact night-coverage at [email or web address], Office  [phone]   03/22/2021, 8:13 AM  LOS: 3 days

## 2021-03-23 LAB — BASIC METABOLIC PANEL
Anion gap: 8 (ref 5–15)
BUN: 12 mg/dL (ref 6–20)
CO2: 34 mmol/L — ABNORMAL HIGH (ref 22–32)
Calcium: 8.6 mg/dL — ABNORMAL LOW (ref 8.9–10.3)
Chloride: 94 mmol/L — ABNORMAL LOW (ref 98–111)
Creatinine, Ser: 0.43 mg/dL — ABNORMAL LOW (ref 0.44–1.00)
GFR, Estimated: >60 mL/min (ref >60)
Glucose, Bld: 146 mg/dL — ABNORMAL HIGH (ref 70–99)
Potassium: 2.7 mmol/L — CL (ref 3.5–5.1)
Sodium: 136 mmol/L (ref 135–145)

## 2021-03-23 LAB — MAGNESIUM: Magnesium: 1.6 mg/dL — ABNORMAL LOW (ref 1.7–2.4)

## 2021-03-23 MED ORDER — BENZONATATE 100 MG PO CAPS
200.0000 mg | ORAL_CAPSULE | Freq: Three times a day (TID) | ORAL | Status: DC | PRN
  Administered 2021-03-23: 200 mg via ORAL
  Filled 2021-03-23: qty 2

## 2021-03-23 MED ORDER — POTASSIUM CHLORIDE 10 MEQ/100ML IV SOLN
10.0000 meq | INTRAVENOUS | Status: AC
Start: 2021-03-23 — End: 2021-03-23
  Administered 2021-03-23 (×6): 10 meq via INTRAVENOUS
  Filled 2021-03-23 (×7): qty 100

## 2021-03-23 MED ORDER — MAGNESIUM SULFATE 2 GM/50ML IV SOLN
2.0000 g | Freq: Once | INTRAVENOUS | Status: AC
  Administered 2021-03-23: 2 g via INTRAVENOUS
  Filled 2021-03-23: qty 50

## 2021-03-23 NOTE — Progress Notes (Signed)
[facility]  PROGRESS NOTE  [name] [name] [id number] DOB: 1961/02/05 DOA: 03/18/2021 PCP: Kerin Perna, NP   Brief HPI:   60 year old female with a history of intermittent asthma, alcohol use presented to ED on 03/18/2021 with shortness of breath.  Home COVID test was negative on 03/16/2021.  In the ED patient received albuterol treatment and Solu-Medrol 125 mg IV x1.  O2 sats were 89% on room air.  SARS-CoV-2 RT-PCR was negative.    Subjective   Complains of dry cough this morning.   Assessment/Plan:     Acute on chronic hypercarbic respiratory failure -Significantly improved -Using BiPAP and at bedtime may be going for urinary -Continue  Mucinex 1200 mg p.o. twice daily; flutter valve every 4 hours while awake -Change Solu-Medrol to 40 mg IV every 12 hours; will switch her to p.o. prednisone 50 mg, tapering dose -Continue BiPAP at bedtime and while sleeping -Continue bronchodilators, scheduled DuoNebs and as needed albuterol -Pulmicort was discontinued and Breo was started by pulmonology -She will need outpatient sleep study for sleep apnea -Will follow pulmonology as outpatient  Acute on chronic diastolic CHF -Patient's chest x-ray showed early CHF, interstitial edema-diuresing well with IV Lasix. -Echocardiogram shows EF of [id number], grade 2 diastolic dysfunction -Continue IV Lasix 40 mg every 12 hours; net -7.8 L -BNP is only 109.3   Alcohol use -No signs and symptoms of alcohol withdrawal -Continue CIWA protocol with Ativan -Continue thiamine, folate  Obesity -BMI 42.65 kg/m -Likely has obesity hypoventilation syndrome and possibly undiagnosed OSA -Sleep study as outpatient  Hypertension -Blood pressure is fairly well controlled -Continue amlodipine  Hypokalemia -Potassium is 2.7 today -Replace IV KCl 10 mEq x 6 -Follow BMP in am  Hypomagnesemia -Magnesium is 1.6 -Replaced magnesium sulfate IV 2 g x 1  UTI -Urine culture from 03/19/2021  growing E. Coli -Sensitive to IV cefazolin -Patient started on  IV Ancef    Scheduled medications:    amLODipine  10 mg Oral Daily   Chlorhexidine Gluconate Cloth  6 each Topical Daily   cholecalciferol  1,000 Units Oral Daily   diclofenac Sodium  4 g Topical BID   enoxaparin (LOVENOX) injection  40 mg Subcutaneous Q24H   FLUoxetine  20 mg Oral Daily   fluticasone furoate-vilanterol  1 puff Inhalation Daily   folic acid  1 mg Oral Daily   furosemide  40 mg Intravenous Q12H   guaiFENesin  1,200 mg Oral BID   ipratropium-albuterol  3 mL Nebulization BID   loratadine  10 mg Oral Daily   multivitamin with minerals  1 tablet Oral Daily   naLOXone (NARCAN)  injection  0.4 mg Intravenous Once   predniSONE  50 mg Oral Q breakfast   Followed by   Derrill Memo ON 03/25/2021] predniSONE  40 mg Oral Q breakfast   Followed by   Derrill Memo ON 03/28/2021] predniSONE  30 mg Oral Q breakfast   Followed by   Derrill Memo ON 03/31/2021] predniSONE  20 mg Oral Q breakfast   Followed by   Derrill Memo ON 04/03/2021] predniSONE  10 mg Oral Q breakfast   thiamine  100 mg Oral Daily   Or   thiamine  100 mg Intravenous Daily         Data Reviewed:   CBG:  Recent Labs  Lab 03/19/21 0755  GLUCAP 200*    SpO2: 93 % O2 Flow Rate (L/min): 3 L/min FiO2 (%): 28 %    Vitals:   03/22/21 2148 03/23/21 0545 03/23/21 0739  03/23/21 1557  BP: 138/80 139/89  126/73  Pulse: 87 89  87  Resp: '14 16  18  '$ Temp: 98.4 F (36.9 C) 97.9 F (36.6 C)  97.8 F (36.6 C)  TempSrc: Oral Axillary  Oral  SpO2: 96% 93% 98% 93%  Weight: 108.3 kg 108.3 kg    Height:         Intake/Output Summary (Last 24 hours) at 03/23/2021 1608 Last data filed at 03/23/2021 1236 Gross per 24 hour  Intake 50 ml  Output 2700 ml  Net -2650 ml    09/01 1901 - 09/03 0700 In: 769.1 [P.O.:370] Out: 3200 [Urine:3200]  Filed Weights   03/20/21 0500 03/22/21 2148 03/23/21 0545  Weight: 108.6 kg 108.3 kg 108.3 kg    CBC:  Recent Labs  Lab  03/18/21 0639 03/19/21 0526 03/20/21 0255 03/21/21 0316 03/21/21 1026 03/22/21 0300  WBC 7.6 13.8* 18.8* 15.0* 14.1* 15.4*  HGB 10.3* 11.2* 10.3* 10.8* 11.2* 10.9*  HCT 32.4* 36.0 33.3* 33.6* 34.4* 32.4*  PLT 193 276 236 240 243 267  MCV 92.6 94.0 94.6 89.8 88.4 86.4  MCH 29.4 29.2 29.3 28.9 28.8 29.1  MCHC 31.8 31.1 30.9 32.1 32.6 33.6  RDW 13.2 13.2 13.2 13.1 13.0 12.6  LYMPHSABS 1.0  --   --   --  1.1  --   MONOABS 0.3  --   --   --  0.6  --   EOSABS 0.1  --   --   --  0.0  --   BASOSABS 0.0  --   --   --  0.0  --     Complete metabolic panel:  Recent Labs  Lab 03/18/21 0639 03/18/21 0702 03/18/21 1923 03/19/21 0526 03/19/21 0903 03/19/21 1115 03/19/21 1403 03/19/21 1657 03/20/21 0255 03/21/21 1026 03/22/21 0300 03/23/21 0622 03/23/21 0902  NA 140  --  142 137  --   --   --   --  146* 137 136 136  --   K 2.6*  --  3.7 4.4  --   --   --  3.8 3.7 3.5 3.1* 2.7*  --   CL 98  --  100 98  --   --   --   --  101 94* 92* 94*  --   CO2 32  --  31 32  --   --   --   --  37* 34* 35* 34*  --   GLUCOSE 196*  --  187* 208*  --   --   --   --  167* 216* 154* 146*  --   BUN 8  --  13 13  --   --   --   --  '18 17 14 12  '$ --   CREATININE 0.56  --  0.68 0.56  --   --   --   --  0.62 0.61 0.48 0.43*  --   CALCIUM 9.2  --  9.4 9.5  --   --   --   --  9.4 8.8* 8.4* 8.6*  --   AST 17  --   --   --   --   --   --   --   --   --   --   --   --   ALT 18  --   --   --   --   --   --   --   --   --   --   --   --  ALKPHOS 57  --   --   --   --   --   --   --   --   --   --   --   --   BILITOT 0.5  --   --   --   --   --   --   --   --   --   --   --   --   ALBUMIN 3.8  --   --   --   --   --   --   --   --   --   --   --   --   MG  --   --  1.7  --   --   --   --   --   --   --   --   --  1.6*  PROCALCITON  --   --   --   --   --   --  <0.10  --   --   --   --   --   --   HGBA1C  --   --   --  5.8*  --   --   --   --   --   --   --   --   --   AMMONIA  --   --   --   --  88*  --   --    --   --   --   --   --   --   BNP  --  52.2  --   --   --  212.2*  --   --   --   --  109.3*  --   --     No results for input(s): LIPASE, AMYLASE in the last 168 hours.  Recent Labs  Lab 03/18/21 0644 03/18/21 0702 03/19/21 1115 03/19/21 1403 03/22/21 0300  BNP  --  52.2 212.2*  --  109.3*  PROCALCITON  --   --   --  <0.10  --   SARSCOV2NAA NEGATIVE  --   --   --   --     ------------------------------------------------------------------------------------------------------------------ No results for input(s): CHOL, HDL, LDLCALC, TRIG, CHOLHDL, LDLDIRECT in the last 72 hours.  Lab Results  Component Value Date   HGBA1C 5.8 (H) 03/19/2021   ------------------------------------------------------------------------------------------------------------------ No results for input(s): TSH, T4TOTAL, T3FREE, THYROIDAB in the last 72 hours.  Invalid input(s): FREET3 ------------------------------------------------------------------------------------------------------------------ No results for input(s): VITAMINB12, FOLATE, FERRITIN, TIBC, IRON, RETICCTPCT in the last 72 hours.  Coagulation profile No results for input(s): INR, PROTIME in the last 168 hours. No results for input(s): DDIMER in the last 72 hours.  Cardiac Enzymes No results for input(s): CKTOTAL, CKMB, CKMBINDEX, TROPONINI in the last 168 hours.  ------------------------------------------------------------------------------------------------------------------    Component Value Date/Time   BNP 109.3 (H) 03/22/2021 0300     Antibiotics: Anti-infectives (From admission, onward)    Start     Dose/Rate Route Frequency Ordered Stop   03/21/21 1530  ceFAZolin (ANCEF) IVPB 1 g/50 mL premix        1 g 100 mL/hr over 30 Minutes Intravenous Every 8 hours 03/21/21 1435     03/19/21 2200  Urelle (URELLE/URISED) 81 MG tablet 81 mg  Status:  Discontinued        1 tablet over 2 Days Oral 3 times daily 03/19/21 1728 03/19/21  1800   03/19/21 2200  Urelle (URELLE/URISED) 81  MG tablet 81 mg  Status:  Discontinued        1 tablet over 2 Days Oral 3 times daily 03/19/21 1800 03/21/21 0849   03/19/21 1400  Ampicillin-Sulbactam (UNASYN) 3 g in sodium chloride 0.9 % 100 mL IVPB  Status:  Discontinued        3 g 200 mL/hr over 30 Minutes Intravenous Every 6 hours 03/19/21 1229 03/21/21 0859        Radiology Reports  No results found.    DVT prophylaxis: Lovenox  Code Status: Full code  Family Communication: No family at bedside   Consultants: PCCM  Procedures:     Objective    Physical Examination:   General-appears in no acute distress Heart-S1-S2, regular, no murmur auscultated Lungs-clear to auscultation bilaterally, no wheezing or crackles auscultated Abdomen-soft, nontender, no organomegaly Extremities-no edema in the lower extremities Neuro-alert, oriented x3, no focal deficit noted   Status is: Inpatient  Dispo: The patient is from: Home              Anticipated d/c is to: Home              Anticipated d/c date is: 03/24/2021              Patient currently not stable for discharge  Barrier to discharge-ongoing management for acute hypercarbic respiratory failure  COVID-19 Labs  No results for input(s): DDIMER, FERRITIN, LDH, CRP in the last 72 hours.  Lab Results  Component Value Date   [place] NEGATIVE 03/18/2021    Microbiology  Recent Results (from the past 240 hour(s))  Resp Panel by RT-PCR (Flu A&B, Covid) Nasopharyngeal Swab     Status: None   Collection Time: 03/18/21  6:44 AM   Specimen: Nasopharyngeal Swab; Nasopharyngeal(NP) swabs in vial transport medium  Result Value Ref Range Status   SARS Coronavirus 2 by RT PCR NEGATIVE NEGATIVE Final    Comment: (NOTE) SARS-CoV-2 target nucleic acids are NOT DETECTED.  The SARS-CoV-2 RNA is generally detectable in upper respiratory specimens during the acute phase of infection. The lowest concentration of  SARS-CoV-2 viral copies this assay can detect is 138 copies/mL. A negative result does not preclude SARS-Cov-2 infection and should not be used as the sole basis for treatment or other patient management decisions. A negative result may occur with  improper specimen collection/handling, submission of specimen other than nasopharyngeal swab, presence of viral mutation(s) within the areas targeted by this assay, and inadequate number of viral copies(<138 copies/mL). A negative result must be combined with clinical observations, patient history, and epidemiological information. The expected result is Negative.  Fact Sheet for Patients:  EntrepreneurPulse.com.au  Fact Sheet for Healthcare Providers:  IncredibleEmployment.be  This test is no t yet approved or cleared by the [place] FDA and  has been authorized for detection and/or diagnosis of SARS-CoV-2 by FDA under an Emergency Use Authorization (EUA). This EUA will remain  in effect (meaning this test can be used) for the duration of the COVID-19 declaration under Section 564(b)(1) of the Act, 21 U.S.C.section 360bbb-3(b)(1), unless the authorization is terminated  or revoked sooner.       Influenza A by PCR NEGATIVE NEGATIVE Final   Influenza B by PCR NEGATIVE NEGATIVE Final    Comment: (NOTE) The Xpert Xpress SARS-CoV-2/FLU/RSV plus assay is intended as an aid in the diagnosis of influenza from Nasopharyngeal swab specimens and should not be used as a sole basis for treatment. Nasal washings and aspirates are unacceptable for  Xpert Xpress SARS-CoV-2/FLU/RSV testing.  Fact Sheet for Patients: EntrepreneurPulse.com.au  Fact Sheet for Healthcare Providers: IncredibleEmployment.be  This test is not yet approved or cleared by the [place] FDA and has been authorized for detection and/or diagnosis of SARS-CoV-2 by FDA under an Emergency Use  Authorization (EUA). This EUA will remain in effect (meaning this test can be used) for the duration of the COVID-19 declaration under Section 564(b)(1) of the Act, 21 U.S.C. section 360bbb-3(b)(1), unless the authorization is terminated or revoked.  Performed at [facility], [place] [address], [place], [place] [address]   Culture, blood (routine x 2)     Status: None (Preliminary result)   Collection Time: 03/19/21  1:55 PM   Specimen: BLOOD  Result Value Ref Range Status   Specimen Description   Final    BLOOD RIGHT ANTECUBITAL Performed at [place] [address], [place], [place] [address]    Special Requests   Final    BOTTLES DRAWN AEROBIC AND ANAEROBIC Blood Culture adequate volume Performed at [place] [address] [place], [place] [address]    Culture   Final    NO GROWTH 4 DAYS Performed at [facility], [place] [address] [place], [place] [address]    Report Status PENDING  Incomplete  Culture, blood (routine x 2)     Status: None (Preliminary result)   Collection Time: 03/19/21  2:03 PM   Specimen: BLOOD  Result Value Ref Range Status   Specimen Description   Final    BLOOD LEFT ANTECUBITAL Performed at [place] [address], [place], [place] [address]    Special Requests   Final    BOTTLES DRAWN AEROBIC AND ANAEROBIC Blood Culture adequate volume Performed at [place] [address] [place], [place] [address]    Culture   Final    NO GROWTH 4 DAYS Performed at [facility], [name] [address] [place], [place] [address]    Report Status PENDING  Incomplete  Urine Culture     Status: Abnormal   Collection Time: 03/19/21  2:51 PM   Specimen: Urine, Clean Catch  Result Value Ref Range Status   Specimen Description   Final    URINE, CLEAN CATCH Performed at [facility], [place] [address], [name], [place] [address]     Special Requests   Final    NONE Performed at [facility], [place] [address] [place]  [address]    Culture >=100,000 COLONIES/mL ESCHERICHIA COLI (A)  Final   Report Status 03/21/2021 FINAL  Final   Organism ID, Bacteria ESCHERICHIA COLI (A)  Final      Susceptibility   Escherichia coli - MIC*    AMPICILLIN >=32 RESISTANT Resistant     CEFAZOLIN <=4 SENSITIVE Sensitive     CEFEPIME <=0.12 SENSITIVE Sensitive     CEFTRIAXONE <=0.25 SENSITIVE Sensitive     CIPROFLOXACIN <=0.25 SENSITIVE Sensitive     GENTAMICIN <=1 SENSITIVE Sensitive     IMIPENEM <=0.25 SENSITIVE Sensitive     NITROFURANTOIN <=16 SENSITIVE Sensitive     TRIMETH/SULFA <=20 SENSITIVE Sensitive     AMPICILLIN/SULBACTAM >=32 RESISTANT Resistant     PIP/TAZO <=4 SENSITIVE Sensitive     * >=100,000 COLONIES/mL ESCHERICHIA COLI  MRSA Next Gen by PCR, Nasal     Status: None   Collection Time: 03/19/21  5:03 PM   Specimen: Nasal Mucosa; Nasal Swab  Result Value Ref Range Status   MRSA by PCR Next Gen NOT DETECTED NOT DETECTED Final    Comment: (NOTE) The GeneXpert MRSA Assay (FDA approved for NASAL specimens only), is one component of a comprehensive MRSA colonization surveillance program. It is not intended to diagnose MRSA infection nor to guide or monitor treatment for MRSA infections. Test performance is not FDA approved in patients less than 70 years old. Performed at [facility], [place] [address] [place], [place] [address]          Oswald Hillock   [facility] If 7PM-7AM, please contact night-coverage at [email or web address], Office  [phone]   03/23/2021, 4:08 PM  LOS: 4 days

## 2021-03-24 LAB — CULTURE, BLOOD (ROUTINE X 2)
Culture: NO GROWTH
Culture: NO GROWTH
Special Requests: ADEQUATE
Special Requests: ADEQUATE

## 2021-03-24 LAB — BASIC METABOLIC PANEL
Anion gap: 11 (ref 5–15)
BUN: 13 mg/dL (ref 6–20)
CO2: 32 mmol/L (ref 22–32)
Calcium: 8.4 mg/dL — ABNORMAL LOW (ref 8.9–10.3)
Chloride: 92 mmol/L — ABNORMAL LOW (ref 98–111)
Creatinine, Ser: 0.4 mg/dL — ABNORMAL LOW (ref 0.44–1.00)
GFR, Estimated: >60 mL/min (ref >60)
Glucose, Bld: 113 mg/dL — ABNORMAL HIGH (ref 70–99)
Potassium: 3 mmol/L — ABNORMAL LOW (ref 3.5–5.1)
Sodium: 135 mmol/L (ref 135–145)

## 2021-03-24 MED ORDER — POTASSIUM CHLORIDE CRYS ER 20 MEQ PO TBCR
40.0000 meq | EXTENDED_RELEASE_TABLET | ORAL | Status: AC
  Administered 2021-03-24 (×3): 40 meq via ORAL
  Filled 2021-03-24 (×3): qty 2

## 2021-03-24 MED ORDER — POTASSIUM CHLORIDE 10 MEQ/100ML IV SOLN
10.0000 meq | INTRAVENOUS | Status: DC
Start: 2021-03-24 — End: 2021-03-24

## 2021-03-24 NOTE — Progress Notes (Signed)
[facility]  PROGRESS NOTE  [name] [name] [id number] DOB: 19-Jun-1961 DOA: 03/18/2021 PCP: Kerin Perna, NP   Brief HPI:   60 year old female with a history of intermittent asthma, alcohol use presented to ED on 03/18/2021 with shortness of breath.  Home COVID test was negative on 03/16/2021.  In the ED patient received albuterol treatment and Solu-Medrol 125 mg IV x1.  O2 sats were 89% on room air.  SARS-CoV-2 RT-PCR was negative.    Subjective   Denies chest pain or shortness of breath.   Assessment/Plan:     Acute on chronic hypercarbic respiratory failure -Significantly improved -Using BiPAP and at bedtime may be going for urinary -Continue  Mucinex 1200 mg p.o. twice daily; flutter valve every 4 hours while awake -Change Solu-Medrol to 40 mg IV every 12 hours; will switch her to p.o. prednisone 50 mg, tapering dose -Continue BiPAP at bedtime and while sleeping -Continue bronchodilators, scheduled DuoNebs and as needed albuterol -Pulmicort was discontinued and Breo was started by pulmonology -She will need outpatient sleep study for sleep apnea -Will follow pulmonology as outpatient -Wean off oxygen as tolerated  Acute on chronic diastolic CHF -Patient's chest x-ray showed early CHF, interstitial edema-diuresing well with IV Lasix. -Echocardiogram shows EF of 123456, grade 2 diastolic dysfunction -Continue IV Lasix 40 mg every 12 hours; net - 8.2 L -Follow BMP in am -BNP is only 109.3   Alcohol use -No signs and symptoms of alcohol withdrawal -Continue CIWA protocol with Ativan -Continue thiamine, folate  Obesity -BMI 42.65 kg/m -Likely has obesity hypoventilation syndrome and possibly undiagnosed OSA -Sleep study as outpatient  Hypertension -Blood pressure is fairly well controlled -Continue amlodipine  Hypokalemia -Potassium is 3.0 today. -We will start K-Dur 40 mEq p.o. every 4 hours x3 doses. -Follow BMP in am  Hypomagnesemia -Magnesium is  1.6 -Replaced magnesium sulfate IV 2 g x 1  UTI -Urine culture from 03/19/2021 growing E. Coli -Sensitive to IV cefazolin -Patient started on  IV Ancef    Scheduled medications:    amLODipine  10 mg Oral Daily   Chlorhexidine Gluconate Cloth  6 each Topical Daily   cholecalciferol  1,000 Units Oral Daily   diclofenac Sodium  4 g Topical BID   enoxaparin (LOVENOX) injection  40 mg Subcutaneous Q24H   FLUoxetine  20 mg Oral Daily   fluticasone furoate-vilanterol  1 puff Inhalation Daily   folic acid  1 mg Oral Daily   furosemide  40 mg Intravenous Q12H   guaiFENesin  1,200 mg Oral BID   ipratropium-albuterol  3 mL Nebulization BID   loratadine  10 mg Oral Daily   multivitamin with minerals  1 tablet Oral Daily   naLOXone (NARCAN)  injection  0.4 mg Intravenous Once   potassium chloride  40 mEq Oral Q4H   [START ON 03/25/2021] predniSONE  40 mg Oral Q breakfast   Followed by   Derrill Memo ON 03/28/2021] predniSONE  30 mg Oral Q breakfast   Followed by   Derrill Memo ON 03/31/2021] predniSONE  20 mg Oral Q breakfast   Followed by   Derrill Memo ON 04/03/2021] predniSONE  10 mg Oral Q breakfast   thiamine  100 mg Oral Daily   Or   thiamine  100 mg Intravenous Daily         Data Reviewed:   CBG:  Recent Labs  Lab 03/19/21 0755  GLUCAP 200*    SpO2: 99 % O2 Flow Rate (L/min): 3 L/min FiO2 (%): 28 %  Vitals:   03/23/21 1838 03/23/21 2055 03/24/21 0509 03/24/21 0747  BP:  (!) 156/92 132/80   Pulse:  89 70   Resp:  16 14   Temp:  98.3 F (36.8 C) 98.1 F (36.7 C)   TempSrc:  Oral Oral   SpO2: 99% 99% 95% 99%  Weight:   113 kg   Height:         Intake/Output Summary (Last 24 hours) at 03/24/2021 1515 Last data filed at 03/24/2021 0926 Gross per 24 hour  Intake 1381.22 ml  Output 3000 ml  Net -1618.78 ml    09/02 1901 - 09/04 0700 In: 1671.2 [P.O.:960] Out: [id number] [Urine:5700]  Filed Weights   03/22/21 2148 03/23/21 0545 03/24/21 0509  Weight: 108.3 kg 108.3 kg 113 kg     CBC:  Recent Labs  Lab 03/18/21 0639 03/19/21 0526 03/20/21 0255 03/21/21 0316 03/21/21 1026 03/22/21 0300  WBC 7.6 13.8* 18.8* 15.0* 14.1* 15.4*  HGB 10.3* 11.2* 10.3* 10.8* 11.2* 10.9*  HCT 32.4* 36.0 33.3* 33.6* 34.4* 32.4*  PLT 193 276 236 240 243 267  MCV 92.6 94.0 94.6 89.8 88.4 86.4  MCH 29.4 29.2 29.3 28.9 28.8 29.1  MCHC 31.8 31.1 30.9 32.1 32.6 33.6  RDW 13.2 13.2 13.2 13.1 13.0 12.6  LYMPHSABS 1.0  --   --   --  1.1  --   MONOABS 0.3  --   --   --  0.6  --   EOSABS 0.1  --   --   --  0.0  --   BASOSABS 0.0  --   --   --  0.0  --     Complete metabolic panel:  Recent Labs  Lab 03/18/21 0639 03/18/21 0702 03/18/21 1923 03/19/21 0526 03/19/21 0903 03/19/21 1115 03/19/21 1403 03/19/21 1657 03/20/21 0255 03/21/21 1026 03/22/21 0300 03/23/21 0622 03/23/21 0902 03/24/21 0551  NA 140  --  142 137  --   --   --   --  146* 137 136 136  --  135  K 2.6*  --  3.7 4.4  --   --   --    < > 3.7 3.5 3.1* 2.7*  --  3.0*  CL 98  --  100 98  --   --   --   --  101 94* 92* 94*  --  92*  CO2 32  --  31 32  --   --   --   --  37* 34* 35* 34*  --  32  GLUCOSE 196*  --  187* 208*  --   --   --   --  167* 216* 154* 146*  --  113*  BUN 8  --  13 13  --   --   --   --  '18 17 14 12  '$ --  13  CREATININE 0.56  --  0.68 0.56  --   --   --   --  0.62 0.61 0.48 0.43*  --  0.40*  CALCIUM 9.2  --  9.4 9.5  --   --   --   --  9.4 8.8* 8.4* 8.6*  --  8.4*  AST 17  --   --   --   --   --   --   --   --   --   --   --   --   --   ALT 18  --   --   --   --   --   --   --   --   --   --   --   --   --  ALKPHOS 57  --   --   --   --   --   --   --   --   --   --   --   --   --   BILITOT 0.5  --   --   --   --   --   --   --   --   --   --   --   --   --   ALBUMIN 3.8  --   --   --   --   --   --   --   --   --   --   --   --   --   MG  --   --  1.7  --   --   --   --   --   --   --   --   --  1.6*  --   PROCALCITON  --   --   --   --   --   --  <0.10  --   --   --   --   --   --   --    HGBA1C  --   --   --  5.8*  --   --   --   --   --   --   --   --   --   --   AMMONIA  --   --   --   --  88*  --   --   --   --   --   --   --   --   --   BNP  --  52.2  --   --   --  212.2*  --   --   --   --  109.3*  --   --   --    < > = values in this interval not displayed.    No results for input(s): LIPASE, AMYLASE in the last 168 hours.  Recent Labs  Lab 03/18/21 0644 03/18/21 0702 03/19/21 1115 03/19/21 1403 03/22/21 0300  BNP  --  52.2 212.2*  --  109.3*  PROCALCITON  --   --   --  <0.10  --   SARSCOV2NAA NEGATIVE  --   --   --   --     ------------------------------------------------------------------------------------------------------------------ No results for input(s): CHOL, HDL, LDLCALC, TRIG, CHOLHDL, LDLDIRECT in the last 72 hours.  Lab Results  Component Value Date   HGBA1C 5.8 (H) 03/19/2021   ------------------------------------------------------------------------------------------------------------------ No results for input(s): TSH, T4TOTAL, T3FREE, THYROIDAB in the last 72 hours.  Invalid input(s): FREET3 ------------------------------------------------------------------------------------------------------------------ No results for input(s): VITAMINB12, FOLATE, FERRITIN, TIBC, IRON, RETICCTPCT in the last 72 hours.  Coagulation profile No results for input(s): INR, PROTIME in the last 168 hours. No results for input(s): DDIMER in the last 72 hours.  Cardiac Enzymes No results for input(s): CKTOTAL, CKMB, CKMBINDEX, TROPONINI in the last 168 hours.  ------------------------------------------------------------------------------------------------------------------    Component Value Date/Time   BNP 109.3 (H) 03/22/2021 0300     Antibiotics: Anti-infectives (From admission, onward)    Start     Dose/Rate Route Frequency Ordered Stop   03/21/21 1530  ceFAZolin (ANCEF) IVPB 1 g/50 mL premix        1 g 100 mL/hr over 30 Minutes Intravenous  Every 8 hours 03/21/21 1435     03/19/21 2200  Urelle (URELLE/URISED) 81 MG  tablet 81 mg  Status:  Discontinued        1 tablet over 2 Days Oral 3 times daily 03/19/21 1728 03/19/21 1800   03/19/21 2200  Urelle (URELLE/URISED) 81 MG tablet 81 mg  Status:  Discontinued        1 tablet over 2 Days Oral 3 times daily 03/19/21 1800 03/21/21 0849   03/19/21 1400  Ampicillin-Sulbactam (UNASYN) 3 g in sodium chloride 0.9 % 100 mL IVPB  Status:  Discontinued        3 g 200 mL/hr over 30 Minutes Intravenous Every 6 hours 03/19/21 1229 03/21/21 0859        Radiology Reports  No results found.    DVT prophylaxis: Lovenox  Code Status: Full code  Family Communication: No family at bedside   Consultants: PCCM  Procedures:     Objective    Physical Examination:   General-appears in no acute distress Heart-S1-S2, regular, no murmur auscultated Lungs-clear to auscultation bilaterally, no wheezing or crackles auscultated Abdomen-soft, nontender, no organomegaly Extremities-no edema in the lower extremities Neuro-alert, oriented x3, no focal deficit noted   Status is: Inpatient  Dispo: The patient is from: Home              Anticipated d/c is to: Home              Anticipated d/c date is: 03/24/2021              Patient currently not stable for discharge  Barrier to discharge-ongoing management for acute hypercarbic respiratory failure  COVID-19 Labs  No results for input(s): DDIMER, FERRITIN, LDH, CRP in the last 72 hours.  Lab Results  Component Value Date   [place] NEGATIVE 03/18/2021    Microbiology  Recent Results (from the past 240 hour(s))  Resp Panel by RT-PCR (Flu A&B, Covid) Nasopharyngeal Swab     Status: None   Collection Time: 03/18/21  6:44 AM   Specimen: Nasopharyngeal Swab; Nasopharyngeal(NP) swabs in vial transport medium  Result Value Ref Range Status   SARS Coronavirus 2 by RT PCR NEGATIVE NEGATIVE Final    Comment: (NOTE) SARS-CoV-2  target nucleic acids are NOT DETECTED.  The SARS-CoV-2 RNA is generally detectable in upper respiratory specimens during the acute phase of infection. The lowest concentration of SARS-CoV-2 viral copies this assay can detect is 138 copies/mL. A negative result does not preclude SARS-Cov-2 infection and should not be used as the sole basis for treatment or other patient management decisions. A negative result may occur with  improper specimen collection/handling, submission of specimen other than nasopharyngeal swab, presence of viral mutation(s) within the areas targeted by this assay, and inadequate number of viral copies(<138 copies/mL). A negative result must be combined with clinical observations, patient history, and epidemiological information. The expected result is Negative.  Fact Sheet for Patients:  EntrepreneurPulse.com.au  Fact Sheet for Healthcare Providers:  IncredibleEmployment.be  This test is no t yet approved or cleared by the [place] FDA and  has been authorized for detection and/or diagnosis of SARS-CoV-2 by FDA under an Emergency Use Authorization (EUA). This EUA will remain  in effect (meaning this test can be used) for the duration of the COVID-19 declaration under Section 564(b)(1) of the Act, 21 U.S.C.section 360bbb-3(b)(1), unless the authorization is terminated  or revoked sooner.       Influenza A by PCR NEGATIVE NEGATIVE Final   Influenza B by PCR NEGATIVE NEGATIVE Final    Comment: (NOTE) The Xpert Xpress  SARS-CoV-2/FLU/RSV plus assay is intended as an aid in the diagnosis of influenza from Nasopharyngeal swab specimens and should not be used as a sole basis for treatment. Nasal washings and aspirates are unacceptable for Xpert Xpress SARS-CoV-2/FLU/RSV testing.  Fact Sheet for Patients: EntrepreneurPulse.com.au  Fact Sheet for Healthcare  Providers: IncredibleEmployment.be  This test is not yet approved or cleared by the [place] FDA and has been authorized for detection and/or diagnosis of SARS-CoV-2 by FDA under an Emergency Use Authorization (EUA). This EUA will remain in effect (meaning this test can be used) for the duration of the COVID-19 declaration under Section 564(b)(1) of the Act, 21 U.S.C. section 360bbb-3(b)(1), unless the authorization is terminated or revoked.  Performed at [facility], [place] [address], [place], [place] [address]   Culture, blood (routine x 2)     Status: None   Collection Time: 03/19/21  1:55 PM   Specimen: BLOOD  Result Value Ref Range Status   Specimen Description   Final    BLOOD RIGHT ANTECUBITAL Performed at [place] [address] [place], [place] [address]    Special Requests   Final    BOTTLES DRAWN AEROBIC AND ANAEROBIC Blood Culture adequate volume Performed at [place] [address], [place], [place] [address]    Culture   Final    NO GROWTH 5 DAYS Performed at [facility], [place] [address], [place], [place] [address]    Report Status 03/24/2021 FINAL  Final  Culture, blood (routine x 2)     Status: None   Collection Time: 03/19/21  2:03 PM   Specimen: BLOOD  Result Value Ref Range Status   Specimen Description   Final    BLOOD LEFT ANTECUBITAL Performed at [place] [address], [place], [place] [address]    Special Requests   Final    BOTTLES DRAWN AEROBIC AND ANAEROBIC Blood Culture adequate volume Performed at [place] [address], [place], [place] [address]    Culture   Final    NO GROWTH 5 DAYS Performed at [facility], [place] [address], [place], [place] [address]    Report Status 03/24/2021 FINAL  Final  Urine Culture     Status: Abnormal   Collection Time: 03/19/21  2:51 PM   Specimen: Urine,  Clean Catch  Result Value Ref Range Status   Specimen Description   Final    URINE, CLEAN CATCH Performed at [facility], [place] [address], [place], [place] [address]    Special Requests   Final    NONE Performed at [facility], [place] [address] [place], [place] [address]    Culture >=100,000 COLONIES/mL ESCHERICHIA COLI (A)  Final   Report Status 03/21/2021 FINAL  Final   Organism ID, Bacteria ESCHERICHIA COLI (A)  Final      Susceptibility   Escherichia coli - MIC*    AMPICILLIN >=32 RESISTANT Resistant     CEFAZOLIN <=4 SENSITIVE Sensitive     CEFEPIME <=0.12 SENSITIVE Sensitive     CEFTRIAXONE <=0.25 SENSITIVE Sensitive     CIPROFLOXACIN <=0.25 SENSITIVE Sensitive     GENTAMICIN <=1 SENSITIVE Sensitive     IMIPENEM <=0.25 SENSITIVE Sensitive     NITROFURANTOIN <=16 SENSITIVE Sensitive     TRIMETH/SULFA <=20 SENSITIVE Sensitive     AMPICILLIN/SULBACTAM >=32 RESISTANT Resistant     PIP/TAZO <=4 SENSITIVE Sensitive     * >=  100,000 COLONIES/mL ESCHERICHIA COLI  MRSA Next Gen by PCR, Nasal     Status: None   Collection Time: 03/19/21  5:03 PM   Specimen: Nasal Mucosa; Nasal Swab  Result Value Ref Range Status   MRSA by PCR Next Gen NOT DETECTED NOT DETECTED Final    Comment: (NOTE) The GeneXpert MRSA Assay (FDA approved for NASAL specimens only), is one component of a comprehensive MRSA colonization surveillance program. It is not intended to diagnose MRSA infection nor to guide or monitor treatment for MRSA infections. Test performance is not FDA approved in patients less than 53 years old. Performed at [facility], [place] [address], [place], [place] [address]          Oswald Hillock   [facility] If 7PM-7AM, please contact night-coverage at [email or web address], Office  [phone]   03/24/2021, 3:15 PM  LOS: 5 days

## 2021-03-25 LAB — CBC
HCT: 32.3 % — ABNORMAL LOW (ref 36.0–46.0)
Hemoglobin: 10.8 g/dL — ABNORMAL LOW (ref 12.0–15.0)
MCH: 29.1 pg (ref 26.0–34.0)
MCHC: 33.4 g/dL (ref 30.0–36.0)
MCV: 87.1 fL (ref 80.0–100.0)
Platelets: 294 10*3/uL (ref 150–400)
RBC: 3.71 MIL/uL — ABNORMAL LOW (ref 3.87–5.11)
RDW: 13 % (ref 11.5–15.5)
WBC: 15.8 10*3/uL — ABNORMAL HIGH (ref 4.0–10.5)
nRBC: 0 % (ref 0.0–0.2)

## 2021-03-25 LAB — BASIC METABOLIC PANEL
Anion gap: 8 (ref 5–15)
BUN: 13 mg/dL (ref 6–20)
CO2: 32 mmol/L (ref 22–32)
Calcium: 8.8 mg/dL — ABNORMAL LOW (ref 8.9–10.3)
Chloride: 98 mmol/L (ref 98–111)
Creatinine, Ser: 0.5 mg/dL (ref 0.44–1.00)
GFR, Estimated: >60 mL/min (ref >60)
Glucose, Bld: 126 mg/dL — ABNORMAL HIGH (ref 70–99)
Potassium: 3.8 mmol/L (ref 3.5–5.1)
Sodium: 138 mmol/L (ref 135–145)

## 2021-03-25 MED ORDER — FOLIC ACID 1 MG PO TABS: 1.0000 mg | ORAL_TABLET | Freq: Every day | ORAL | 0 refills | Status: DC

## 2021-03-25 MED ORDER — FUROSEMIDE 40 MG PO TABS: 40.0000 mg | ORAL_TABLET | Freq: Every day | ORAL | 11 refills | Status: DC

## 2021-03-25 MED ORDER — THIAMINE HCL 100 MG PO TABS: 100.0000 mg | ORAL_TABLET | Freq: Every day | ORAL | 0 refills | Status: DC

## 2021-03-25 MED ORDER — PREDNISONE 10 MG PO TABS: ORAL_TABLET | ORAL | 0 refills | Status: DC

## 2021-03-25 MED ORDER — POTASSIUM CHLORIDE CRYS ER 20 MEQ PO TBCR: 20.0000 meq | EXTENDED_RELEASE_TABLET | Freq: Every day | ORAL | 2 refills | Status: DC

## 2021-03-25 NOTE — Care Management Important Message (Signed)
Important Message  Patient Details IM Letter given to the Patient. Name: [name] [name] MRN: [id number] Date of Birth: 1960-09-20   Medicare Important Message Given:  Yes     Kerin Salen 03/25/2021, 11:10 AM

## 2021-03-25 NOTE — TOC Transition Note (Signed)
Transition of Care [facility]) - CM/SW Discharge Note   Patient Details  Name: [name] [name] MRN: [id number] Date of Birth: [birth year]  Transition of [facility]) CM/SW Contact:  Trish Mage, LCSW Phone Number: 03/25/2021, 2:12 PM   Clinical Narrative:   Patient who is stable for d/c is in need of Home O2, has a ride home.  SAT note and orders seen and appreciated.  Contacted Caryl Pina with [employer] who will arrange for delivery of travel cannister, home unit.  Will take several hours, this being a holiday. No further needs identified.  TOC sign off.    Final next level of care: Home/Self Care Barriers to Discharge: Barriers Resolved   Patient Goals and CMS Choice        Discharge Placement                       Discharge Plan and Services   Discharge Planning Services: CM Consult                                 Social Determinants of Health (SDOH) Interventions     Readmission Risk Interventions No flowsheet data found.

## 2021-03-25 NOTE — Discharge Summary (Addendum)
Physician Discharge Summary  [name] [name]:[id number] DOB: 11/07/1960 DOA: 03/18/2021  PCP: Kerin Perna, NP  Admit date: 03/18/2021 Discharge date: 03/25/2021  Time spent: 60 minutes  Recommendations for Outpatient Follow-up:  Follow-up pulmonology in 1 week  Discharge Diagnoses:  Principal Problem:   Mild intermittent asthma with (acute) exacerbation Acute on chronic hypercarbic respiratory failure Acute on chronic diastolic CHF exacerbation Active Problems:   Seasonal allergies   Alcohol use disorder   Anxiety with depression   Osteoarthritis, multiple sites   Hypoxia   Lump of axilla, left   Acute respiratory failure [facility])   Discharge Condition: Stable  Diet recommendation: Heart healthy diet  Filed Weights   03/23/21 0545 03/24/21 0509 03/25/21 0509  Weight: 108.3 kg 113 kg 112.8 kg    History of present illness:  60 year old female with a history of intermittent asthma, alcohol use presented to ED on 03/18/2021 with shortness of breath.  Home COVID test was negative on 03/16/2021.  In the ED patient received albuterol treatment and Solu-Medrol 125 mg IV x1.  O2 sats were 89% on room air.  SARS-CoV-2 RT-PCR was negative.    Hospital Course:   Acute on chronic hypercarbic respiratory failure -Significantly improved -Patient was put on BiPAP at bedtime -Also treated with Solu-Medrol which has been switched to prednisone taper -Pulmonology has signed off, recommend to restart Symbicort when she goes home. -She will need outpatient sleep study for sleep apnea -Will follow pulmonology as outpatient in 1 week -O2 desat study done, patient will go home on 2 L/min of oxygen -Discussed with PCCM Dr. Silas Flood, patient does not qualify for trilogy BiPAP at this time.  She will need outpatient sleep study.  Pulmonology to follow-up in 1 week.   Acute on chronic diastolic CHF -Patient's chest x-ray showed early CHF, interstitial edema-diuresed well with IV  Lasix -Echocardiogram shows EF of 85%, grade 2 diastolic dysfunction -Net -10.2 L -We will discharge home on Lasix 40 mg p.o. daily     Alcohol use -No signs and symptoms of alcohol withdrawal -Patient was started on CIWA protocol with Ativan -Continue thiamine, folate   Obesity -BMI 42.65 kg/m -Likely has obesity hypoventilation syndrome and possibly undiagnosed OSA -Sleep study as outpatient   Hypertension -Blood pressure is fairly well controlled -Continue amlodipine   Hypokalemia -Replete   Hypomagnesemia -Magnesium was 1.6 on 03/24/2021 -Replaced magnesium sulfate IV 2 g x 1   UTI -Urine culture from 03/19/2021 growing E. Coli -Sensitive to IV cefazolin -Patient started on  IV Ancef -Completed 5 days of treatment in the hospital  Procedures:   Consultations: Pulmonology  Discharge Exam: Vitals:   03/25/21 0509 03/25/21 0742  BP: (!) 142/84   Pulse: 83   Resp: 16   Temp: 98.1 F (36.7 C)   SpO2: 100% 100%    General: Appears in no acute distress Cardiovascular: S1-S2, regular Respiratory: Clear to auscultation bilaterally  Discharge Instructions   Discharge Instructions     Diet - low sodium heart healthy   Complete by: As directed    Increase activity slowly   Complete by: As directed       Allergies as of 03/25/2021       Reactions   Tetracyclines & Related Itching        Medication List     STOP taking these medications    chlorthalidone 50 MG tablet Commonly known as: HYGROTON   cyclobenzaprine 10 MG tablet Commonly known as: FLEXERIL   ibuprofen 800  MG tablet Commonly known as: ADVIL       TAKE these medications    albuterol 108 (90 Base) MCG/ACT inhaler Commonly known as: VENTOLIN HFA INHALE 2 PUFFS INTO THE LUNGS EVERY 6 (SIX) HOURS AS NEEDED FOR WHEEZING OR SHORTNESS OF BREATH.   amLODipine 10 MG tablet Commonly known as: NORVASC Take 1 tablet (10 mg total) by mouth daily.   Blood Pressure Monitor Kit 1 kit  by Does not apply route 3 (three) times daily as needed.   cholecalciferol 25 MCG (1000 UNIT) tablet Commonly known as: VITAMIN D3 Take 1,000 Units by mouth daily.   dextromethorphan-guaiFENesin 30-600 MG 12hr tablet Commonly known as: MUCINEX DM Take 1 tablet by mouth 2 (two) times daily as needed for cough.   diclofenac Sodium 1 % Gel Commonly known as: VOLTAREN APPLY 4 GRAM(S) TOPICALLY TWICE DAILY AS NEEDED What changed:  how much to take how to take this when to take this reasons to take this additional instructions   FLUoxetine 20 MG capsule Commonly known as: PROZAC Take 1 capsule (20 mg total) by mouth daily.   folic acid 1 MG tablet Commonly known as: FOLVITE Take 1 tablet (1 mg total) by mouth daily. Start taking on: March 26, 2021   furosemide 40 MG tablet Commonly known as: Lasix Take 1 tablet (40 mg total) by mouth daily.   hydrOXYzine 25 MG tablet Commonly known as: ATARAX/VISTARIL TAKE 1 TABLET (25 MG TOTAL) BY MOUTH EVERY 8 (EIGHT) HOURS AS NEEDED. What changed: reasons to take this   loratadine 10 MG tablet Commonly known as: CLARITIN Take 1 tablet (10 mg total) by mouth daily. What changed:  when to take this reasons to take this   potassium chloride SA 20 MEQ tablet Commonly known as: KLOR-CON Take 1 tablet (20 mEq total) by mouth daily. Take along with lasix   predniSONE 10 MG tablet Commonly known as: DELTASONE Prednisone 40 mg po daily x 3 days then Prednisone 30 mg po daily x 3 days then Prednisone 20 mg po daily x 3 days then Prednisone 10 mg daily x 3 day then stop...   Symbicort 160-4.5 MCG/ACT inhaler Generic drug: budesonide-formoterol Inhale 2 puffs into the lungs 2 (two) times daily.   thiamine 100 MG tablet Take 1 tablet (100 mg total) by mouth daily. Start taking on: March 26, 2021   traZODone 50 MG tablet Commonly known as: DESYREL Take 1 tablet (50 mg total) by mouth at bedtime.               Durable  Medical Equipment  (From admission, onward)           Start     Ordered   03/25/21 1351  For home use only DME oxygen  Once       Question Answer Comment  Length of Need Lifetime   Mode or (Route) Nasal cannula   Liters per Minute 2   Frequency Continuous (stationary and portable oxygen unit needed)   Oxygen conserving device Yes   Oxygen delivery system Gas      03/25/21 1350           Allergies  Allergen Reactions   Tetracyclines & Related Itching    Follow-up Information     [facility] Follow up in 1 week(s).   Specialty: Pulmonology Contact information: [address] [place] [address] [phone]                   The results of significant diagnostics from this hospitalization (including imaging, microbiology, ancillary and laboratory) are listed below for reference.    Significant Diagnostic Studies: DG Chest 1 View  Result Date: 03/18/2021 CLINICAL DATA:  Shortness of breath. EXAM: CHEST  1 VIEW COMPARISON:  01/01/2011 FINDINGS: 0651 hours. The cardio pericardial silhouette is enlarged. Left base atelectasis or infiltrate. Right lung clear. The visualized bony structures of the thorax show no acute abnormality. IMPRESSION: Cardiomegaly with left base atelectasis or infiltrate. Dedicated PA and lateral upright chest x-ray recommended, when patient is able. Electronically Signed   By: Eric  Mansell M.D.   On: 03/18/2021 07:34   DG CHEST PORT 1 VIEW  Result Date: 03/19/2021 CLINICAL DATA:  Shortness of breath EXAM: PORTABLE CHEST 1 VIEW COMPARISON:  03/18/2021 FINDINGS: Cardiac shadow is enlarged but accentuated by the portable technique. Mild central vascular congestion is seen slightly increased from the prior exam. No focal infiltrate or effusion is noted. No bony abnormality is seen. IMPRESSION: Increase in vascular congestion likely representing early CHF. Electronically Signed   By: Mark  Lukens M.D.   On:  03/19/2021 08:42   DG Shoulder Left  Result Date: 03/12/2021 CLINICAL DATA:  Left shoulder pain.  Chronic left shoulder pain EXAM: LEFT SHOULDER - 2+ VIEW COMPARISON:  None. FINDINGS: There is no evidence of fracture or dislocation. Minimal acromioclavicular spurring. Small subacromial spur. Trace inferior glenoid spurring. No erosion, evidence of avascular necrosis or focal bone abnormality. Soft tissues are unremarkable. IMPRESSION: Minimal osteoarthritis of the acromioclavicular and glenohumeral joint. Small subacromial spur. Electronically Signed   By: Melanie  Sanford M.D.   On: 03/12/2021 23:48   ECHOCARDIOGRAM COMPLETE  Result Date: 03/19/2021    ECHOCARDIOGRAM REPORT   Patient Name:   Makaylyn J Ledin Date of Exam: 03/19/2021 Medical Rec #:  [id number]     Height:       63.0 in Accession #:    [id number]    Weight:       240.7 lb Date of Birth:  08/06/1960      BSA:          2.092 m Patient Age:    60 years      BP:           137/75 mmHg Patient Gender: F             HR:           102 bpm. Exam Location:  Inpatient Procedure: 2D Echo, Cardiac Doppler and Color Doppler Indications:    hypoxemia  History:        Patient has no prior history of Echocardiogram examinations.                 Risk Factors:Hypertension.  Sonographer:    MH Referring Phys: [id number] CHI JANE ELLISON IMPRESSIONS  1. Left ventricular ejection fraction, by estimation, is 60 to 65%. The left ventricle has normal function. The left ventricle has no regional wall motion abnormalities. There is moderate concentric left ventricular hypertrophy. Left ventricular diastolic parameters are consistent with Grade II diastolic dysfunction (pseudonormalization). Elevated left atrial pressure.  2. Right ventricular systolic function is normal. The right ventricular size is normal. There is mildly elevated pulmonary artery systolic pressure.  3. Left atrial size was moderately dilated.  4. The mitral valve is normal in structure. Mild mitral valve  regurgitation. No evidence of mitral stenosis.  5. The aortic valve is tricuspid. Aortic valve regurgitation is mild. No aortic stenosis is present.    6. The inferior vena cava is dilated in size with <50% respiratory variability, suggesting right atrial pressure of 15 mmHg. FINDINGS  Left Ventricle: Left ventricular ejection fraction, by estimation, is 60 to 65%. The left ventricle has normal function. The left ventricle has no regional wall motion abnormalities. The left ventricular internal cavity size was normal in size. There is  moderate concentric left ventricular hypertrophy. Left ventricular diastolic parameters are consistent with Grade II diastolic dysfunction (pseudonormalization). Elevated left atrial pressure. Right Ventricle: The right ventricular size is normal. No increase in right ventricular wall thickness. Right ventricular systolic function is normal. There is mildly elevated pulmonary artery systolic pressure. The tricuspid regurgitant velocity is 2.42  m/s, and with an assumed right atrial pressure of 15 mmHg, the estimated right ventricular systolic pressure is 16.1 mmHg. Left Atrium: Left atrial size was moderately dilated. Right Atrium: Right atrial size was normal in size. Pericardium: There is no evidence of pericardial effusion. Mitral Valve: The mitral valve is normal in structure. Mild mitral valve regurgitation. No evidence of mitral valve stenosis. Tricuspid Valve: The tricuspid valve is normal in structure. Tricuspid valve regurgitation is trivial. No evidence of tricuspid stenosis. Aortic Valve: The aortic valve is tricuspid. Aortic valve regurgitation is mild. Aortic regurgitation PHT measures 276 msec. No aortic stenosis is present. Aortic valve mean gradient measures 6.0 mmHg. Aortic valve peak gradient measures 10.6 mmHg. Aortic valve area, by VTI measures 1.90 cm. Pulmonic Valve: The pulmonic valve was not well visualized. Pulmonic valve regurgitation is not visualized. No  evidence of pulmonic stenosis. Aorta: The aortic root is normal in size and structure. Venous: The inferior vena cava is dilated in size with less than 50% respiratory variability, suggesting right atrial pressure of 15 mmHg. IAS/Shunts: No atrial level shunt detected by color flow Doppler.  LEFT VENTRICLE PLAX 2D LVIDd:         4.10 cm     Diastology LVIDs:         3.00 cm     LV e' medial:  7.07 cm/s LV PW:         1.60 cm     LV e' lateral: 4.90 cm/s LV IVS:        1.70 cm LVOT diam:     1.90 cm LV SV:         49 LV SV Index:   24 LVOT Area:     2.84 cm  LV Volumes (MOD) LV vol d, MOD A4C: 30.1 ml LV vol s, MOD A4C: 13.5 ml LV SV MOD A4C:     30.1 ml RIGHT VENTRICLE             IVC RV S prime:     15.40 cm/s  IVC diam: 2.60 cm TAPSE (M-mode): 2.4 cm LEFT ATRIUM           Index       RIGHT ATRIUM           Index LA diam:      4.50 cm 2.15 cm/m  RA Area:     13.40 cm LA Vol (A2C): 37.6 ml 17.97 ml/m RA Volume:   27.80 ml  13.29 ml/m LA Vol (A4C): 31.0 ml 14.82 ml/m  AORTIC VALVE AV Area (Vmax):    1.83 cm AV Area (Vmean):   1.80 cm AV Area (VTI):     1.90 cm AV Vmax:           163.00 cm/s AV Vmean:  112.000 cm/s AV VTI:            0.259 m AV Peak Grad:      10.6 mmHg AV Mean Grad:      6.0 mmHg LVOT Vmax:         105.00 cm/s LVOT Vmean:        71.100 cm/s LVOT VTI:          0.174 m LVOT/AV VTI ratio: 0.67 AI PHT:  AORTA Ao Root diam: 3.40 cm Ao Asc diam:  3.30 cm MITRAL VALVE               TRICUSPID VALVE MV Area (PHT): 5.64 cm    TR Peak grad:   23.4 mmHg MV A velocity: 85.90 cm/s  TR Vmax:        242.00 cm/s                             SHUNTS                            Systemic VTI:  0.17 m                            Systemic Diam: 1.90 cm Dani Gobble Croitoru MD Electronically signed by Sanda Klein MD Signature Date/Time: 03/19/2021/5:14:21 PM    Final    DG HIP UNILAT W OR W/O PELVIS 2-3 VIEWS LEFT  Result Date: 03/12/2021 CLINICAL DATA:  Left hip pain and arthralgia. Lateral and posterior hip  pain for 1 year. EXAM: DG HIP (WITH OR WITHOUT PELVIS) 2-3V LEFT COMPARISON:  None. FINDINGS: Moderate left hip joint space narrowing. There is lateral acetabular spurring with mild subchondral cystic change. Medial femoral head neck spurring. No evidence of fracture. The pubic rami are intact no visualized avascular necrosis, focal bone lesion, erosion or bony destruction. Pubic symphysis and sacroiliac joints are congruent. Moderate right hip osteoarthritis is also noted. IMPRESSION: Moderate left hip osteoarthritis. Electronically Signed   By: Keith Rake M.D.   On: 03/12/2021 23:49    Microbiology: Recent Results (from the past 240 hour(s))  Resp Panel by RT-PCR (Flu A&B, Covid) Nasopharyngeal Swab     Status: None   Collection Time: 03/18/21  6:44 AM   Specimen: Nasopharyngeal Swab; Nasopharyngeal(NP) swabs in vial transport medium  Result Value Ref Range Status   SARS Coronavirus 2 by RT PCR NEGATIVE NEGATIVE Final    Comment: (NOTE) SARS-CoV-2 target nucleic acids are NOT DETECTED.  The SARS-CoV-2 RNA is generally detectable in upper respiratory specimens during the acute phase of infection. The lowest concentration of SARS-CoV-2 viral copies this assay can detect is 138 copies/mL. A negative result does not preclude SARS-Cov-2 infection and should not be used as the sole basis for treatment or other patient management decisions. A negative result may occur with  improper specimen collection/handling, submission of specimen other than nasopharyngeal swab, presence of viral mutation(s) within the areas targeted by this assay, and inadequate number of viral copies(<138 copies/mL). A negative result must be combined with clinical observations, patient history, and epidemiological information. The expected result is Negative.  Fact Sheet for Patients:  EntrepreneurPulse.com.au  Fact Sheet for Healthcare Providers:   IncredibleEmployment.be  This test is no t yet approved or cleared by the [place] FDA and  has been authorized for detection and/or diagnosis of SARS-CoV-2 by FDA  under an Emergency Use Authorization (EUA). This EUA will remain  in effect (meaning this test can be used) for the duration of the COVID-19 declaration under Section 564(b)(1) of the Act, 21 U.S.C.section 360bbb-3(b)(1), unless the authorization is terminated  or revoked sooner.       Influenza A by PCR NEGATIVE NEGATIVE Final   Influenza B by PCR NEGATIVE NEGATIVE Final    Comment: (NOTE) The Xpert Xpress SARS-CoV-2/FLU/RSV plus assay is intended as an aid in the diagnosis of influenza from Nasopharyngeal swab specimens and should not be used as a sole basis for treatment. Nasal washings and aspirates are unacceptable for Xpert Xpress SARS-CoV-2/FLU/RSV testing.  Fact Sheet for Patients: https://www.fda.gov/media/152166/download  Fact Sheet for Healthcare Providers: https://www.fda.gov/media/152162/download  This test is not yet approved or cleared by the [place] FDA and has been authorized for detection and/or diagnosis of SARS-CoV-2 by FDA under an Emergency Use Authorization (EUA). This EUA will remain in effect (meaning this test can be used) for the duration of the COVID-19 declaration under Section 564(b)(1) of the Act, 21 U.S.C. section 360bbb-3(b)(1), unless the authorization is terminated or revoked.  Performed at [facility], [address] [place] [address]   Culture, blood (routine x 2)     Status: None   Collection Time: 03/19/21  1:55 PM   Specimen: BLOOD  Result Value Ref Range Status   Specimen Description   Final    BLOOD RIGHT ANTECUBITAL Performed at [facility], [address] [place], [place] [address]    Special Requests   Final    BOTTLES DRAWN AEROBIC AND ANAEROBIC Blood Culture adequate  volume Performed at [facility], [address] [place], [place] [address]    Culture   Final    NO GROWTH 5 DAYS Performed at [facility], [address] [place], [place] [address]    Report Status 03/24/2021 FINAL  Final  Culture, blood (routine x 2)     Status: None   Collection Time: 03/19/21  2:03 PM   Specimen: BLOOD  Result Value Ref Range Status   Specimen Description   Final    BLOOD LEFT ANTECUBITAL Performed at [facility], [address] [place], [place] [address]    Special Requests   Final    BOTTLES DRAWN AEROBIC AND ANAEROBIC Blood Culture adequate volume Performed at [facility], [address] [place], [place] [address]    Culture   Final    NO GROWTH 5 DAYS Performed at [facility], [address] [place], [place] [address]    Report Status 03/24/2021 FINAL  Final  Urine Culture     Status: Abnormal   Collection Time: 03/19/21  2:51 PM   Specimen: Urine, Clean Catch  Result Value Ref Range Status   Specimen Description   Final    URINE, CLEAN CATCH Performed at [facility], [address] [place], [place] [address]    Special Requests   Final    NONE [facility] at  [facility], [address] [place], [place] [address]    Culture >=100,000 COLONIES/mL ESCHERICHIA COLI (A)  Final   Report Status 03/21/2021 FINAL  Final   Organism ID, Bacteria ESCHERICHIA COLI (A)  Final      Susceptibility   Escherichia coli - MIC*    AMPICILLIN >=32 RESISTANT Resistant     CEFAZOLIN <=4 SENSITIVE Sensitive       CEFEPIME <=0.12 SENSITIVE Sensitive     CEFTRIAXONE <=0.25 SENSITIVE Sensitive     CIPROFLOXACIN <=0.25 SENSITIVE Sensitive     GENTAMICIN <=1 SENSITIVE Sensitive     IMIPENEM <=0.25 SENSITIVE Sensitive     NITROFURANTOIN <=16 SENSITIVE Sensitive     TRIMETH/SULFA <=20 SENSITIVE Sensitive     AMPICILLIN/SULBACTAM >=32 RESISTANT Resistant      PIP/TAZO <=4 SENSITIVE Sensitive     * >=100,000 COLONIES/mL ESCHERICHIA COLI  MRSA Next Gen by PCR, Nasal     Status: None   Collection Time: 03/19/21  5:03 PM   Specimen: Nasal Mucosa; Nasal Swab  Result Value Ref Range Status   MRSA by PCR Next Gen NOT DETECTED NOT DETECTED Final    Comment: (NOTE) The GeneXpert MRSA Assay (FDA approved for NASAL specimens only), is one component of a comprehensive MRSA colonization surveillance program. It is not intended to diagnose MRSA infection nor to guide or monitor treatment for MRSA infections. Test performance is not FDA approved in patients less than 2 years old. Performed at [facility], [address] [place], [place] [address]      Labs: Basic Metabolic Panel: Recent Labs  Lab 03/18/21 1923 03/19/21 0526 03/21/21 1026 03/22/21 0300 03/23/21 0622 03/23/21 0902 03/24/21 0551 03/25/21 0456  NA 142   < > 137 136 136  --  135 138  K 3.7   < > 3.5 3.1* 2.7*  --  3.0* 3.8  CL 100   < > 94* 92* 94*  --  92* 98  CO2 31   < > 34* 35* 34*  --  32 32  GLUCOSE 187*   < > 216* 154* 146*  --  113* 126*  BUN 13   < > 17 14 12  --  13 13  CREATININE 0.68   < > 0.61 0.48 0.43*  --  0.40* 0.50  CALCIUM 9.4   < > 8.8* 8.4* 8.6*  --  8.4* 8.8*  MG 1.7  --   --   --   --  1.6*  --   --    < > = values in this interval not displayed.   Liver Function Tests: No results for input(s): AST, ALT, ALKPHOS, BILITOT, PROT, ALBUMIN in the last 168 hours. No results for input(s): LIPASE, AMYLASE in the last 168 hours. Recent Labs  Lab 03/19/21 0903  AMMONIA 88*   CBC: Recent Labs  Lab 03/20/21 0255 03/21/21 0316 03/21/21 1026 03/22/21 0300 03/25/21 0456  WBC 18.8* 15.0* 14.1* 15.4* 15.8*  NEUTROABS  --   --  12.2*  --   --   HGB 10.3* 10.8* 11.2* 10.9* 10.8*  HCT 33.3* 33.6* 34.4* 32.4* 32.3*  MCV 94.6 89.8 88.4 86.4 87.1  PLT 236 240 243 267 294   Cardiac Enzymes: No results for input(s): CKTOTAL, CKMB,  CKMBINDEX, TROPONINI in the last 168 hours. BNP: BNP (last 3 results) Recent Labs    03/18/21 0702 03/19/21 1115 03/22/21 0300  BNP 52.2 212.2* 109.3*    ProBNP (last 3 results) No results for input(s): PROBNP in the last 8760 hours.  CBG: Recent Labs  Lab 03/19/21 0755  GLUCAP 200*       Signed:  Gagan S Lama MD.  [facility] 03/25/2021, 2:09 PM    

## 2021-03-25 NOTE — Progress Notes (Signed)
SATURATION QUALIFICATIONS: (This note is used to comply with regulatory documentation for home oxygen)  Patient Saturations on Room Air at Rest = 95%  Patient Saturations on Room Air while Ambulating = 87%  Patient Saturations on 2 Liters of oxygen while Ambulating = 95%  Please briefly explain why patient needs home oxygen:  While walking long distances patients saturations drops below 89%.

## 2021-03-26 ENCOUNTER — Telehealth (INDEPENDENT_AMBULATORY_CARE_PROVIDER_SITE_OTHER): Payer: Self-pay

## 2021-03-26 ENCOUNTER — Telehealth: Payer: Self-pay

## 2021-03-26 ENCOUNTER — Other Ambulatory Visit: Payer: Self-pay | Admitting: Physician Assistant

## 2021-03-26 DIAGNOSIS — Z13228 Encounter for screening for other metabolic disorders: Secondary | ICD-10-CM

## 2021-03-26 DIAGNOSIS — Z13220 Encounter for screening for lipoid disorders: Secondary | ICD-10-CM

## 2021-03-26 NOTE — Telephone Encounter (Signed)
Copied from [place] [id number]. Topic: General - Other >> Mar 26, 2021 11:19 AM Yvette Rack wrote: Reason for CRM: Caryl Pina with [employer] requests call back from Tatum Corl at [phone]  Please contact Caryl Pina with [employer]. I spoke with him briefly but from what I gathered this needs to be something directly discussed with PCP. Nat Christen, CMA

## 2021-03-26 NOTE — Telephone Encounter (Signed)
Spoke with Caryl Pina advised to contact pulmonology

## 2021-03-26 NOTE — Telephone Encounter (Signed)
Transition Care Management Follow-up Telephone Call Date of discharge and from where: 03/25/2021, [facility]  How have you been since you were released from the hospital? She said she is feeling okay Any questions or concerns? Yes - she is concerned about the mold and roaches in her apartment. Her air filter has not been changed in over a year.  She said she has not contacted her landlord because she was told not to call the landlord until she speaks to Legal Aid. She was in agreement to having this CM send a referral to [employer] [facility] [place] and provided authorization to release medical information as needed  referral then sent to Novella Olive, Attorney/LANC. She is requesting a walker and a nebulizer. She said she has never used a nebulizer at home but used one in the hospital and it helped her. Instructed her to speak with the pulmonologist about the nebulizer at her appointment in 2 days. Informed her that Ms Oletta Lamas, NP would also be notified of her requests.   She needs a referral for a sleep study.   Items Reviewed: Did the pt receive and understand the discharge instructions provided? Yes  Medications obtained and verified? Yes - She said she has all medications and did not have any questions about her med regime.  Other? No  Any new allergies since your discharge? No  Dietary orders reviewed? No Do you have support at home?  Lives alone but currently staying with her daughter due to the mold and roaches in her home.   Home Care and Equipment/Supplies: Were home health services ordered? no If so, what is the name of the agency? N/a  Has the agency set up a time to come to the patient's home? not applicable Were any new equipment or medical supplies ordered?  Yes: O2 What is the name of the medical supply agency? [facility] Were you able to get the supplies/equipment? yes Do you have any questions related to the use of the equipment or supplies? No  Functional Questionnaire: (I =  Independent and D = Dependent) ADLs: independent    Follow up appointments reviewed:  PCP Hospital f/u appt confirmed? Yes  Scheduled to see Juluis Mire, NP on 04/15/2021 @ 0930. [facility] f/u appt confirmed? Yes  Scheduled to see pulmonary on 03/28/2021 Are transportation arrangements needed? No  If their condition worsens, is the pt aware to call PCP or go to the Emergency Dept.? Yes Was the patient provided with contact information for the PCP's office or ED? Yes Was to pt encouraged to call back with questions or concerns? Yes

## 2021-03-27 ENCOUNTER — Other Ambulatory Visit (INDEPENDENT_AMBULATORY_CARE_PROVIDER_SITE_OTHER): Payer: Self-pay | Admitting: Primary Care

## 2021-03-27 ENCOUNTER — Telehealth (INDEPENDENT_AMBULATORY_CARE_PROVIDER_SITE_OTHER): Payer: Self-pay

## 2021-03-27 DIAGNOSIS — J4520 Mild intermittent asthma, uncomplicated: Secondary | ICD-10-CM

## 2021-03-27 DIAGNOSIS — G8929 Other chronic pain: Secondary | ICD-10-CM

## 2021-03-27 DIAGNOSIS — M25562 Pain in left knee: Secondary | ICD-10-CM

## 2021-03-27 NOTE — Telephone Encounter (Signed)
Copied from [place] [id number]. Topic: General - Other >> Mar 27, 2021  3:29 PM Yvette Rack wrote: Reason for CRM: Melissa with [facility] asked if provider would consider an order for home health nursing. Cb# [phone] Ext [phone]

## 2021-03-28 ENCOUNTER — Encounter: Payer: Self-pay | Admitting: Primary Care

## 2021-03-28 ENCOUNTER — Other Ambulatory Visit: Payer: Self-pay

## 2021-03-28 ENCOUNTER — Ambulatory Visit (INDEPENDENT_AMBULATORY_CARE_PROVIDER_SITE_OTHER): Payer: Medicare Other | Admitting: Primary Care

## 2021-03-28 VITALS — BP 128/74 | HR 104 | Temp 98.8°F | Ht 63.0 in | Wt 243.8 lb

## 2021-03-28 DIAGNOSIS — R0683 Snoring: Secondary | ICD-10-CM | POA: Diagnosis not present

## 2021-03-28 DIAGNOSIS — J9600 Acute respiratory failure, unspecified whether with hypoxia or hypercapnia: Secondary | ICD-10-CM

## 2021-03-28 DIAGNOSIS — J4520 Mild intermittent asthma, uncomplicated: Secondary | ICD-10-CM | POA: Diagnosis not present

## 2021-03-28 DIAGNOSIS — R0902 Hypoxemia: Secondary | ICD-10-CM

## 2021-03-28 DIAGNOSIS — J4521 Mild intermittent asthma with (acute) exacerbation: Secondary | ICD-10-CM

## 2021-03-28 LAB — IGE: IgE (Immunoglobulin E), Serum: 75 IU/mL

## 2021-03-28 MED ORDER — FLUTICASONE FUROATE-VILANTEROL 200-25 MCG/INH IN AEPB: 1.0000 | INHALATION_SPRAY | Freq: Every day | RESPIRATORY_TRACT | 11 refills | Status: DC

## 2021-03-28 NOTE — Assessment & Plan Note (Signed)
-   Patient has symptoms of snoring and restless sleep. She required BIPAP during her hospitalization. Epworth 7/24. Needs in-lab split night sleep study to assess for obstructive sleep apnea

## 2021-03-28 NOTE — Assessment & Plan Note (Addendum)
-   Clinically stable today. No acute respiratory complaints.  - Continue BREO 200 one puff daily - Needs Pulmonary function testing on return visit in 3 months

## 2021-03-28 NOTE — Progress Notes (Signed)
'@Patient'  ID: [name] [name], female    DOB: [birth year], 60 y.o.   MRN: [id number]  Chief Complaint  Patient presents with   [facility] follow up, patient is on 2 liters on oxygen and Breo.     Referring provider: Kerin Perna, NP  HPI: 60 year old female, never smoked.  Past medical history significant for mild intermittent asthma, allergies, acute respiratory failure, elevated blood pressure readings, osteoarthritis, alcohol use disorder, anxiety/depression.   03/28/2021 Patient presents today for hospital follow-up.  She was admitted from 03/18/2021-03/25/2021 for acute on chronic hypercarbic respiratory failure, acute on chronic diastolic heart failure.  Patient was consulted by Dr. Loanne Drilling with PCCM inpatient.  Patient presented to emergency room shortness of breath/wheezing.  COVID test was negative.  Oxygen saturation was 89% on room air.  She received albuterol nebulized treatments and IV Solu-Medrol.  It was recommended that she restart her Symbicort for mild intermittent asthma.  BiPAP at bedtime, patient likely has undiagnosed obesity hypoventilation syndrome and possibly OSA.  She was discharged on 2 L of oxygen and prednisone taper.  Need outpatient sleep study.  She is doing well today. She has had not episodes of shortness of breath, wheezing or chest tightness. She is using Breo 250mg daily as prescribed. Prior to hospitalization she was not sleeping well. She is currently taking trazodone prescribed buy her PCP which has been helping.   Sleep questionnaire Symptoms- Snoring, restless sleep Prior sleep study- None Bedtime-9-10pm Out of bed in morning- 6am Nocturnal awakenings- once  Epworth- 7  Allergies  Allergen Reactions   Tetracyclines & Related Itching    Immunization History  Administered Date(s) Administered   PPD Test 03/12/2015   Tdap 03/27/2015    Past Medical History:  Diagnosis Date   Asthma    Bloody stool    Dental decay     Gastritis, Helicobacter pylori 166/12/3014  Hypertension    Pneumonia 06/21/2015   Prepyloric ulcer 06/22/2015   Seasonal allergies    Shortness of breath dyspnea    with exertion    Tobacco History: Social History   Tobacco Use  Smoking Status Never  Smokeless Tobacco Never   Counseling given: Not Answered   Outpatient Medications Prior to Visit  Medication Sig Dispense Refill   albuterol (VENTOLIN HFA) 108 (90 Base) MCG/ACT inhaler INHALE 2 PUFFS INTO THE LUNGS EVERY 6 (SIX) HOURS AS NEEDED FOR WHEEZING OR SHORTNESS OF BREATH. 18 g 0   amLODipine (NORVASC) 10 MG tablet Take 1 tablet (10 mg total) by mouth daily. 30 tablet 1   Blood Pressure Monitor KIT 1 kit by Does not apply route 3 (three) times daily as needed. 1 kit 0   cholecalciferol (VITAMIN D3) 25 MCG (1000 UNIT) tablet Take 1,000 Units by mouth daily.     dextromethorphan-guaiFENesin (MUCINEX DM) 30-600 MG 12hr tablet Take 1 tablet by mouth 2 (two) times daily as needed for cough.     diclofenac Sodium (VOLTAREN) 1 % GEL APPLY 4 GRAM(S) TOPICALLY TWICE DAILY AS NEEDED (Patient taking differently: Apply 4 g topically 2 (two) times daily as needed (pain).) 350 g 1   FLUoxetine (PROZAC) 20 MG capsule Take 1 capsule (20 mg total) by mouth daily. 30 capsule 1   folic acid (FOLVITE) 1 MG tablet Take 1 tablet (1 mg total) by mouth daily. 30 tablet 0   furosemide (LASIX) 40 MG tablet Take 1 tablet (40 mg total) by mouth daily. 30 tablet 11   hydrOXYzine (  ATARAX/VISTARIL) 25 MG tablet TAKE 1 TABLET (25 MG TOTAL) BY MOUTH EVERY 8 (EIGHT) HOURS AS NEEDED. (Patient taking differently: Take 25 mg by mouth every 8 (eight) hours as needed for anxiety.) 90 tablet 2   loratadine (CLARITIN) 10 MG tablet Take 1 tablet (10 mg total) by mouth daily. (Patient taking differently: Take 10 mg by mouth daily as needed for allergies.) 30 tablet 11   potassium chloride SA (KLOR-CON) 20 MEQ tablet Take 1 tablet (20 mEq total) by mouth daily. Take along  with lasix 30 tablet 2   thiamine 100 MG tablet Take 1 tablet (100 mg total) by mouth daily. 30 tablet 0   traZODone (DESYREL) 50 MG tablet Take 1 tablet (50 mg total) by mouth at bedtime. 30 tablet 1   fluticasone furoate-vilanterol (BREO ELLIPTA) 200-25 MCG/INH AEPB Inhale 1 puff into the lungs daily.     predniSONE (DELTASONE) 10 MG tablet Prednisone 40 mg po daily x 3 days then Prednisone 30 mg po daily x 3 days then Prednisone 20 mg po daily x 3 days then Prednisone 10 mg daily x 3 day then stop... (Patient not taking: Reported on 03/28/2021) 30 tablet 0   SYMBICORT 160-4.5 MCG/ACT inhaler Inhale 2 puffs into the lungs 2 (two) times daily. (Patient not taking: Reported on 03/28/2021) 1 each 0   No facility-administered medications prior to visit.      Review of Systems  Review of Systems  Constitutional: Negative.   HENT: Negative.    Respiratory:  Negative for cough, chest tightness, shortness of breath and wheezing.     Physical Exam  BP 128/74 (BP Location: Left Arm, Patient Position: Sitting, Cuff Size: Normal)   Pulse (!) 104   Temp 98.8 F (37.1 C) (Oral)   Ht '5\' 3"'  (1.6 m)   Wt 243 lb 12.8 oz (110.6 kg)   SpO2 98%   BMI 43.19 kg/m  Physical Exam Constitutional:      Appearance: Normal appearance. She is obese.  HENT:     Head: Normocephalic and atraumatic.     Mouth/Throat:     Comments: Deferred d/t masking Cardiovascular:     Rate and Rhythm: Normal rate and regular rhythm.  Pulmonary:     Effort: Pulmonary effort is normal.     Breath sounds: Normal breath sounds.     Comments: CTA Musculoskeletal:        General: Normal range of motion.  Skin:    General: Skin is warm and dry.  Neurological:     General: No focal deficit present.     Mental Status: She is alert and oriented to person, place, and time. Mental status is at baseline.  Psychiatric:        Mood and Affect: Mood normal.        Behavior: Behavior normal.        Thought Content: Thought  content normal.        Judgment: Judgment normal.     Lab Results:  CBC    Component Value Date/Time   WBC 15.8 (H) 03/25/2021 0456   RBC 3.71 (L) 03/25/2021 0456   HGB 10.8 (L) 03/25/2021 0456   HGB 13.0 03/12/2021 0901   HCT 32.3 (L) 03/25/2021 0456   HCT 40.5 03/12/2021 0901   PLT 294 03/25/2021 0456   PLT 230 03/12/2021 0901   MCV 87.1 03/25/2021 0456   MCV 90 03/12/2021 0901   MCH 29.1 03/25/2021 0456   MCHC 33.4 03/25/2021 0456   RDW  13.0 03/25/2021 0456   RDW 12.8 03/12/2021 0901   LYMPHSABS 1.1 03/21/2021 1026   LYMPHSABS 1.9 03/12/2021 0901   MONOABS 0.6 03/21/2021 1026   EOSABS 0.0 03/21/2021 1026   EOSABS 0.1 03/12/2021 0901   BASOSABS 0.0 03/21/2021 1026   BASOSABS 0.0 03/12/2021 0901    BMET    Component Value Date/Time   NA 138 03/25/2021 0456   NA CANCELED 03/12/2021 0901   K 3.8 03/25/2021 0456   CL 98 03/25/2021 0456   CO2 32 03/25/2021 0456   GLUCOSE 126 (H) 03/25/2021 0456   BUN 13 03/25/2021 0456   BUN CANCELED 03/12/2021 0901   CREATININE 0.50 03/25/2021 0456   CALCIUM 8.8 (L) 03/25/2021 0456   GFRNONAA >60 03/25/2021 0456   GFRAA >60 10/28/2019 1227    BNP    Component Value Date/Time   BNP 109.3 (H) 03/22/2021 0300    ProBNP    Component Value Date/Time   PROBNP 59.0 03/14/2010 0710    Imaging: DG Chest 1 View  Result Date: 03/18/2021 CLINICAL DATA:  Shortness of breath. EXAM: CHEST  1 VIEW COMPARISON:  01/01/2011 FINDINGS: 0651 hours. The cardio pericardial silhouette is enlarged. Left base atelectasis or infiltrate. Right lung clear. The visualized bony structures of the thorax show no acute abnormality. IMPRESSION: Cardiomegaly with left base atelectasis or infiltrate. Dedicated PA and lateral upright chest x-ray recommended, when patient is able. Electronically Signed   By: Misty Stanley M.D.   On: 03/18/2021 07:34   DG CHEST PORT 1 VIEW  Result Date: 03/19/2021 CLINICAL DATA:  Shortness of breath EXAM: PORTABLE CHEST 1  VIEW COMPARISON:  03/18/2021 FINDINGS: Cardiac shadow is enlarged but accentuated by the portable technique. Mild central vascular congestion is seen slightly increased from the prior exam. No focal infiltrate or effusion is noted. No bony abnormality is seen. IMPRESSION: Increase in vascular congestion likely representing early CHF. Electronically Signed   By: Inez Catalina M.D.   On: 03/19/2021 08:42   DG Shoulder Left  Result Date: 03/12/2021 CLINICAL DATA:  Left shoulder pain.  Chronic left shoulder pain EXAM: LEFT SHOULDER - 2+ VIEW COMPARISON:  None. FINDINGS: There is no evidence of fracture or dislocation. Minimal acromioclavicular spurring. Small subacromial spur. Trace inferior glenoid spurring. No erosion, evidence of avascular necrosis or focal bone abnormality. Soft tissues are unremarkable. IMPRESSION: Minimal osteoarthritis of the acromioclavicular and glenohumeral joint. Small subacromial spur. Electronically Signed   By: Keith Rake M.D.   On: 03/12/2021 23:48   ECHOCARDIOGRAM COMPLETE  Result Date: 03/19/2021    ECHOCARDIOGRAM REPORT   Patient Name:   EVY LUTTERMAN Date of Exam: 03/19/2021 Medical Rec #:  [id number]     Height:       63.0 in Accession #:    [id number]    Weight:       240.7 lb Date of Birth:  Nov 19, 1960      BSA:          2.092 m Patient Age:    45 years      BP:           137/75 mmHg Patient Gender: F             HR:           102 bpm. Exam Location:  Inpatient Procedure: 2D Echo, Cardiac Doppler and Color Doppler Indications:    hypoxemia  History:        Patient has no prior history of Echocardiogram examinations.  Risk Factors:Hypertension.  Sonographer:    MH Referring Phys: [id number] Persichetti  1. Left ventricular ejection fraction, by estimation, is 60 to 65%. The left ventricle has normal function. The left ventricle has no regional wall motion abnormalities. There is moderate concentric left ventricular hypertrophy. Left ventricular  diastolic parameters are consistent with Grade II diastolic dysfunction (pseudonormalization). Elevated left atrial pressure.  2. Right ventricular systolic function is normal. The right ventricular size is normal. There is mildly elevated pulmonary artery systolic pressure.  3. Left atrial size was moderately dilated.  4. The mitral valve is normal in structure. Mild mitral valve regurgitation. No evidence of mitral stenosis.  5. The aortic valve is tricuspid. Aortic valve regurgitation is mild. No aortic stenosis is present.  6. The inferior vena cava is dilated in size with <50% respiratory variability, suggesting right atrial pressure of 15 mmHg. FINDINGS  Left Ventricle: Left ventricular ejection fraction, by estimation, is 60 to 65%. The left ventricle has normal function. The left ventricle has no regional wall motion abnormalities. The left ventricular internal cavity size was normal in size. There is  moderate concentric left ventricular hypertrophy. Left ventricular diastolic parameters are consistent with Grade II diastolic dysfunction (pseudonormalization). Elevated left atrial pressure. Right Ventricle: The right ventricular size is normal. No increase in right ventricular wall thickness. Right ventricular systolic function is normal. There is mildly elevated pulmonary artery systolic pressure. The tricuspid regurgitant velocity is 2.42  m/s, and with an assumed right atrial pressure of 15 mmHg, the estimated right ventricular systolic pressure is 22.0 mmHg. Left Atrium: Left atrial size was moderately dilated. Right Atrium: Right atrial size was normal in size. Pericardium: There is no evidence of pericardial effusion. Mitral Valve: The mitral valve is normal in structure. Mild mitral valve regurgitation. No evidence of mitral valve stenosis. Tricuspid Valve: The tricuspid valve is normal in structure. Tricuspid valve regurgitation is trivial. No evidence of tricuspid stenosis. Aortic Valve: The aortic  valve is tricuspid. Aortic valve regurgitation is mild. Aortic regurgitation PHT measures 276 msec. No aortic stenosis is present. Aortic valve mean gradient measures 6.0 mmHg. Aortic valve peak gradient measures 10.6 mmHg. Aortic valve area, by VTI measures 1.90 cm. Pulmonic Valve: The pulmonic valve was not well visualized. Pulmonic valve regurgitation is not visualized. No evidence of pulmonic stenosis. Aorta: The aortic root is normal in size and structure. Venous: The inferior vena cava is dilated in size with less than 50% respiratory variability, suggesting right atrial pressure of 15 mmHg. IAS/Shunts: No atrial level shunt detected by color flow Doppler.  LEFT VENTRICLE PLAX 2D LVIDd:         4.10 cm     Diastology LVIDs:         3.00 cm     LV e' medial:  7.07 cm/s LV PW:         1.60 cm     LV e' lateral: 4.90 cm/s LV IVS:        1.70 cm LVOT diam:     1.90 cm LV SV:         49 LV SV Index:   24 LVOT Area:     2.84 cm  LV Volumes (MOD) LV vol d, MOD A4C: 30.1 ml LV vol s, MOD A4C: 13.5 ml LV SV MOD A4C:     30.1 ml RIGHT VENTRICLE             IVC RV S prime:     15.40  cm/s  IVC diam: 2.60 cm TAPSE (M-mode): 2.4 cm LEFT ATRIUM           Index       RIGHT ATRIUM           Index LA diam:      4.50 cm 2.15 cm/m  RA Area:     13.40 cm LA Vol (A2C): 37.6 ml 17.97 ml/m RA Volume:   27.80 ml  13.29 ml/m LA Vol (A4C): 31.0 ml 14.82 ml/m  AORTIC VALVE AV Area (Vmax):    1.83 cm AV Area (Vmean):   1.80 cm AV Area (VTI):     1.90 cm AV Vmax:           163.00 cm/s AV Vmean:          112.000 cm/s AV VTI:            0.259 m AV Peak Grad:      10.6 mmHg AV Mean Grad:      6.0 mmHg LVOT Vmax:         105.00 cm/s LVOT Vmean:        71.100 cm/s LVOT VTI:          0.174 m LVOT/AV VTI ratio: 0.67 AI PHT:  AORTA Ao Root diam: 3.40 cm Ao Asc diam:  3.30 cm MITRAL VALVE               TRICUSPID VALVE MV Area (PHT): 5.64 cm    TR Peak grad:   23.4 mmHg MV A velocity: 85.90 cm/s  TR Vmax:        242.00 cm/s                              SHUNTS                            Systemic VTI:  0.17 m                            Systemic Diam: 1.90 cm Dani Gobble Croitoru MD Electronically signed by Sanda Klein MD Signature Date/Time: 03/19/2021/5:14:21 PM    Final    DG HIP UNILAT W OR W/O PELVIS 2-3 VIEWS LEFT  Result Date: 03/12/2021 CLINICAL DATA:  Left hip pain and arthralgia. Lateral and posterior hip pain for 1 year. EXAM: DG HIP (WITH OR WITHOUT PELVIS) 2-3V LEFT COMPARISON:  None. FINDINGS: Moderate left hip joint space narrowing. There is lateral acetabular spurring with mild subchondral cystic change. Medial femoral head neck spurring. No evidence of fracture. The pubic rami are intact no visualized avascular necrosis, focal bone lesion, erosion or bony destruction. Pubic symphysis and sacroiliac joints are congruent. Moderate right hip osteoarthritis is also noted. IMPRESSION: Moderate left hip osteoarthritis. Electronically Signed   By: Keith Rake M.D.   On: 03/12/2021 23:49     Assessment & Plan:   Mild intermittent asthma without complication - Clinically stable today. No acute respiratory complaints.  - Continue BREO 200 one puff daily - Needs Pulmonary function testing on return visit in 3 months   Acute respiratory failure ([place]) - Patient was started on oxygen after most recent hospitalization in August-September 2022 d/t CHF, asthma and suspect undiagnosed OHS/OSA  - Her O2 level today was 92-94% RA at rest. Walked on RA x 1 lap = approx 2104f at regular pace and  stopped d/t shortness of breath, desaturted to 85% - Continue 2L oxygen with exertion and at night   Snoring - Patient has symptoms of snoring and restless sleep. She required BIPAP during her hospitalization. Epworth 7/24. Needs in-lab split night sleep study to assess for obstructive sleep apnea  40 mins spent on case, > 50% face to face with patient   Martyn Ehrich, NP 03/28/2021

## 2021-03-28 NOTE — Assessment & Plan Note (Addendum)
-   Patient was started on oxygen after most recent hospitalization in August-September 2022 d/t CHF, asthma and suspect undiagnosed OHS/OSA  - Her O2 level today was 92-94% RA at rest. Walked on RA x 1 lap = approx 237f at regular pace and stopped d/t shortness of breath, desaturted to 85% - Continue 2L oxygen with exertion and at night

## 2021-03-28 NOTE — Patient Instructions (Addendum)
  Recommendations: - Continue Breo 226mg one puff daily  - You must continue to wear 2 L of oxygen with any exertion and at night - You can come off oxygen for short period of time AT REST, please monitor O2 level to ensure it stays about 90%  Orders: - In-lab split night sleep study re: OHS - Pulmonary function test   Follow-up: - 8-12 week follow-up with Dr. ELoanne Drillingwith PFTs

## 2021-03-29 ENCOUNTER — Other Ambulatory Visit: Payer: Self-pay | Admitting: Physician Assistant

## 2021-03-29 DIAGNOSIS — F418 Other specified anxiety disorders: Secondary | ICD-10-CM

## 2021-04-02 ENCOUNTER — Telehealth: Payer: Self-pay | Admitting: *Deleted

## 2021-04-02 NOTE — Telephone Encounter (Signed)
Patient has a follow up with PCP and needs to have labs repeated on 9/26.

## 2021-04-02 NOTE — Telephone Encounter (Signed)
-----   Message from Kennieth Rad, [place] sent at 03/26/2021  9:15 AM EDT ----- Please call patient and let her know that her labs were unfortunately canceled, there was a technical issue with the resulting lab.  Please encourage her to follow-up in the mobile unit for repeat testing.

## 2021-04-09 ENCOUNTER — Other Ambulatory Visit: Payer: Self-pay | Admitting: Physician Assistant

## 2021-04-09 DIAGNOSIS — F418 Other specified anxiety disorders: Secondary | ICD-10-CM

## 2021-04-13 ENCOUNTER — Other Ambulatory Visit: Payer: Self-pay | Admitting: Physician Assistant

## 2021-04-13 ENCOUNTER — Ambulatory Visit
Admission: RE | Admit: 2021-04-13 | Discharge: 2021-04-13 | Disposition: A | Discharge status: Home / Self Care | Payer: Medicare Other | Source: Ambulatory Visit | Attending: Physician Assistant | Admitting: Physician Assistant

## 2021-04-13 DIAGNOSIS — Z1231 Encounter for screening mammogram for malignant neoplasm of breast: Secondary | ICD-10-CM

## 2021-04-15 ENCOUNTER — Ambulatory Visit (INDEPENDENT_AMBULATORY_CARE_PROVIDER_SITE_OTHER): Payer: Medicare Other | Admitting: Primary Care

## 2021-04-18 ENCOUNTER — Telehealth: Payer: Self-pay | Admitting: *Deleted

## 2021-04-18 NOTE — Telephone Encounter (Signed)
Patient verified DOB Patient is aware of labs being normal and a repeat being completed in 1 year.

## 2021-04-18 NOTE — Telephone Encounter (Signed)
-----   Message from Kennieth Rad, [place] sent at 04/18/2021 12:07 PM EDT ----- Please call patient and let her know that her mammogram did not show any malignancy.  She should repeat her mammogram in 1 year.

## 2021-04-29 ENCOUNTER — Other Ambulatory Visit: Payer: Self-pay | Admitting: Physician Assistant

## 2021-04-29 DIAGNOSIS — I10 Essential (primary) hypertension: Secondary | ICD-10-CM

## 2021-05-14 ENCOUNTER — Inpatient Hospital Stay (HOSPITAL_COMMUNITY): Payer: Medicare Other

## 2021-05-14 ENCOUNTER — Inpatient Hospital Stay (HOSPITAL_COMMUNITY)
Admission: EM | Admit: 2021-05-14 | Discharge: 2021-05-16 | DRG: 208 | Disposition: A | Discharge status: Home / Self Care | Payer: Medicare Other | Attending: Internal Medicine | Admitting: Internal Medicine

## 2021-05-14 ENCOUNTER — Emergency Department (HOSPITAL_COMMUNITY): Payer: Medicare Other

## 2021-05-14 DIAGNOSIS — I480 Paroxysmal atrial fibrillation: Secondary | ICD-10-CM | POA: Diagnosis present

## 2021-05-14 DIAGNOSIS — J45901 Unspecified asthma with (acute) exacerbation: Secondary | ICD-10-CM | POA: Diagnosis present

## 2021-05-14 DIAGNOSIS — Z79891 Long term (current) use of opiate analgesic: Secondary | ICD-10-CM

## 2021-05-14 DIAGNOSIS — J9622 Acute and chronic respiratory failure with hypercapnia: Secondary | ICD-10-CM | POA: Diagnosis present

## 2021-05-14 DIAGNOSIS — R4182 Altered mental status, unspecified: Secondary | ICD-10-CM | POA: Diagnosis present

## 2021-05-14 DIAGNOSIS — Z8249 Family history of ischemic heart disease and other diseases of the circulatory system: Secondary | ICD-10-CM

## 2021-05-14 DIAGNOSIS — Z6841 Body Mass Index (BMI) 40.0 and over, adult: Secondary | ICD-10-CM

## 2021-05-14 DIAGNOSIS — Z9981 Dependence on supplemental oxygen: Secondary | ICD-10-CM

## 2021-05-14 DIAGNOSIS — Z20822 Contact with and (suspected) exposure to covid-19: Secondary | ICD-10-CM | POA: Diagnosis present

## 2021-05-14 DIAGNOSIS — F418 Other specified anxiety disorders: Secondary | ICD-10-CM | POA: Diagnosis present

## 2021-05-14 DIAGNOSIS — G4700 Insomnia, unspecified: Secondary | ICD-10-CM | POA: Diagnosis present

## 2021-05-14 DIAGNOSIS — I110 Hypertensive heart disease with heart failure: Secondary | ICD-10-CM | POA: Diagnosis present

## 2021-05-14 DIAGNOSIS — E876 Hypokalemia: Secondary | ICD-10-CM | POA: Diagnosis present

## 2021-05-14 DIAGNOSIS — J9621 Acute and chronic respiratory failure with hypoxia: Secondary | ICD-10-CM | POA: Diagnosis present

## 2021-05-14 DIAGNOSIS — J302 Other seasonal allergic rhinitis: Secondary | ICD-10-CM | POA: Diagnosis present

## 2021-05-14 DIAGNOSIS — Z881 Allergy status to other antibiotic agents status: Secondary | ICD-10-CM

## 2021-05-14 DIAGNOSIS — J9602 Acute respiratory failure with hypercapnia: Secondary | ICD-10-CM

## 2021-05-14 DIAGNOSIS — Z7951 Long term (current) use of inhaled steroids: Secondary | ICD-10-CM | POA: Diagnosis not present

## 2021-05-14 DIAGNOSIS — J189 Pneumonia, unspecified organism: Secondary | ICD-10-CM | POA: Diagnosis present

## 2021-05-14 DIAGNOSIS — J9601 Acute respiratory failure with hypoxia: Secondary | ICD-10-CM

## 2021-05-14 DIAGNOSIS — J9690 Respiratory failure, unspecified, unspecified whether with hypoxia or hypercapnia: Secondary | ICD-10-CM | POA: Diagnosis present

## 2021-05-14 DIAGNOSIS — I509 Heart failure, unspecified: Secondary | ICD-10-CM | POA: Diagnosis present

## 2021-05-14 DIAGNOSIS — Z79899 Other long term (current) drug therapy: Secondary | ICD-10-CM | POA: Diagnosis not present

## 2021-05-14 DIAGNOSIS — R0689 Other abnormalities of breathing: Secondary | ICD-10-CM | POA: Diagnosis present

## 2021-05-14 DIAGNOSIS — G4733 Obstructive sleep apnea (adult) (pediatric): Secondary | ICD-10-CM | POA: Diagnosis present

## 2021-05-14 DIAGNOSIS — Z0189 Encounter for other specified special examinations: Secondary | ICD-10-CM

## 2021-05-14 DIAGNOSIS — I4891 Unspecified atrial fibrillation: Secondary | ICD-10-CM

## 2021-05-14 LAB — COMPREHENSIVE METABOLIC PANEL
ALT: 18 U/L (ref 0–44)
AST: 17 U/L (ref 15–41)
Albumin: 3.6 g/dL (ref 3.5–5.0)
Alkaline Phosphatase: 61 U/L (ref 38–126)
Anion gap: 10 (ref 5–15)
BUN: 7 mg/dL (ref 6–20)
CO2: 31 mmol/L (ref 22–32)
Calcium: 8.8 mg/dL — ABNORMAL LOW (ref 8.9–10.3)
Chloride: 98 mmol/L (ref 98–111)
Creatinine, Ser: 0.64 mg/dL (ref 0.44–1.00)
GFR, Estimated: >60 mL/min (ref >60)
Glucose, Bld: 266 mg/dL — ABNORMAL HIGH (ref 70–99)
Potassium: 4 mmol/L (ref 3.5–5.1)
Sodium: 139 mmol/L (ref 135–145)
Total Bilirubin: 1 mg/dL (ref 0.3–1.2)
Total Protein: 7.3 g/dL (ref 6.5–8.1)

## 2021-05-14 LAB — LACTIC ACID, PLASMA
Lactic Acid, Venous: 1.5 mmol/L (ref 0.5–1.9)
Lactic Acid, Venous: 3.1 mmol/L (ref 0.5–1.9)
Lactic Acid, Venous: 2 mmol/L (ref 0.5–1.9)

## 2021-05-14 LAB — BLOOD GAS, ARTERIAL
Acid-Base Excess: 5.2 mmol/L — ABNORMAL HIGH (ref 0.0–2.0)
Acid-Base Excess: 5 mmol/L — ABNORMAL HIGH (ref 0.0–2.0)
Bicarbonate: 35.1 mmol/L — ABNORMAL HIGH (ref 20.0–28.0)
Bicarbonate: 31.4 mmol/L — ABNORMAL HIGH (ref 20.0–28.0)
FIO2: 24
FIO2: 100
O2 Saturation: 96.2 %
O2 Saturation: 98.7 %
Patient temperature: 36
Patient temperature: 36
pCO2 arterial: >120 mmHg (ref 32.0–48.0)
pCO2 arterial: 65.6 mmHg (ref 32.0–48.0)
pH, Arterial: 7.084 — CL (ref 7.350–7.450)
pH, Arterial: 7.295 — ABNORMAL LOW (ref 7.350–7.450)
pO2, Arterial: 117 mmHg — ABNORMAL HIGH (ref 83.0–108.0)

## 2021-05-14 LAB — BASIC METABOLIC PANEL
Anion gap: 12 (ref 5–15)
BUN: 9 mg/dL (ref 6–20)
CO2: 27 mmol/L (ref 22–32)
Calcium: 9.1 mg/dL (ref 8.9–10.3)
Chloride: 103 mmol/L (ref 98–111)
Creatinine, Ser: 0.83 mg/dL (ref 0.44–1.00)
GFR, Estimated: >60 mL/min (ref >60)
Glucose, Bld: 149 mg/dL — ABNORMAL HIGH (ref 70–99)
Potassium: 3.1 mmol/L — ABNORMAL LOW (ref 3.5–5.1)
Sodium: 142 mmol/L (ref 135–145)

## 2021-05-14 LAB — URINALYSIS, ROUTINE W REFLEX MICROSCOPIC
Bilirubin Urine: NEGATIVE
Glucose, UA: NEGATIVE mg/dL
Hgb urine dipstick: NEGATIVE
Ketones, ur: NEGATIVE mg/dL
Leukocytes,Ua: NEGATIVE
Nitrite: NEGATIVE
Protein, ur: NEGATIVE mg/dL
Specific Gravity, Urine: 1.009 (ref 1.005–1.030)
pH: 6 (ref 5.0–8.0)

## 2021-05-14 LAB — BRAIN NATRIURETIC PEPTIDE: B Natriuretic Peptide: 150.6 pg/mL — ABNORMAL HIGH (ref 0.0–100.0)

## 2021-05-14 LAB — GLUCOSE, CAPILLARY
Glucose-Capillary: 176 mg/dL — ABNORMAL HIGH (ref 70–99)
Glucose-Capillary: 115 mg/dL — ABNORMAL HIGH (ref 70–99)
Glucose-Capillary: 126 mg/dL — ABNORMAL HIGH (ref 70–99)
Glucose-Capillary: 163 mg/dL — ABNORMAL HIGH (ref 70–99)

## 2021-05-14 LAB — CBC WITH DIFFERENTIAL/PLATELET
Abs Immature Granulocytes: 0.09 10*3/uL — ABNORMAL HIGH (ref 0.00–0.07)
Basophils Absolute: 0 10*3/uL (ref 0.0–0.1)
Basophils Relative: 0 %
Eosinophils Absolute: 0 10*3/uL (ref 0.0–0.5)
Eosinophils Relative: 0 %
HCT: 40.3 % (ref 36.0–46.0)
Hemoglobin: 11.8 g/dL — ABNORMAL LOW (ref 12.0–15.0)
Immature Granulocytes: 1 %
Lymphocytes Relative: 7 %
Lymphs Abs: 0.7 10*3/uL (ref 0.7–4.0)
MCH: 27.1 pg (ref 26.0–34.0)
MCHC: 29.3 g/dL — ABNORMAL LOW (ref 30.0–36.0)
MCV: 92.6 fL (ref 80.0–100.0)
Monocytes Absolute: 0.4 10*3/uL (ref 0.1–1.0)
Monocytes Relative: 4 %
Neutro Abs: 8.9 10*3/uL — ABNORMAL HIGH (ref 1.7–7.7)
Neutrophils Relative %: 88 %
Platelets: 277 10*3/uL (ref 150–400)
RBC: 4.35 MIL/uL (ref 3.87–5.11)
RDW: 15 % (ref 11.5–15.5)
WBC: 10.1 10*3/uL (ref 4.0–10.5)
nRBC: 0 % (ref 0.0–0.2)

## 2021-05-14 LAB — MRSA NEXT GEN BY PCR, NASAL: MRSA by PCR Next Gen: NOT DETECTED

## 2021-05-14 LAB — CORTISOL: Cortisol, Plasma: 15.1 ug/dL

## 2021-05-14 LAB — RAPID URINE DRUG SCREEN, HOSP PERFORMED
Amphetamines: NOT DETECTED
Barbiturates: NOT DETECTED
Benzodiazepines: NOT DETECTED
Cocaine: NOT DETECTED
Opiates: NOT DETECTED
Tetrahydrocannabinol: NOT DETECTED

## 2021-05-14 LAB — MAGNESIUM
Magnesium: 1.7 mg/dL (ref 1.7–2.4)
Magnesium: 1.4 mg/dL — ABNORMAL LOW (ref 1.7–2.4)

## 2021-05-14 LAB — RESP PANEL BY RT-PCR (FLU A&B, COVID) ARPGX2
Influenza A by PCR: NEGATIVE
Influenza B by PCR: NEGATIVE
SARS Coronavirus 2 by RT PCR: NEGATIVE

## 2021-05-14 LAB — PROCALCITONIN: Procalcitonin: <0.1 ng/mL

## 2021-05-14 LAB — AMYLASE: Amylase: 24 U/L — ABNORMAL LOW (ref 28–100)

## 2021-05-14 LAB — CBG MONITORING, ED: Glucose-Capillary: 259 mg/dL — ABNORMAL HIGH (ref 70–99)

## 2021-05-14 LAB — PROTIME-INR
INR: 1 (ref 0.8–1.2)
Prothrombin Time: 13.6 seconds (ref 11.4–15.2)

## 2021-05-14 LAB — APTT: aPTT: 26 seconds (ref 24–36)

## 2021-05-14 LAB — PHOSPHORUS: Phosphorus: 6 mg/dL — ABNORMAL HIGH (ref 2.5–4.6)

## 2021-05-14 LAB — LIPASE, BLOOD: Lipase: 20 U/L (ref 11–51)

## 2021-05-14 LAB — HIV ANTIBODY (ROUTINE TESTING W REFLEX): HIV Screen 4th Generation wRfx: NONREACTIVE

## 2021-05-14 LAB — STREP PNEUMONIAE URINARY ANTIGEN: Strep Pneumo Urinary Antigen: NEGATIVE

## 2021-05-14 LAB — ETHANOL: Alcohol, Ethyl (B): <10 mg/dL (ref <10)

## 2021-05-14 MED ORDER — FENTANYL CITRATE PF 50 MCG/ML IJ SOSY
100.0000 ug | PREFILLED_SYRINGE | Freq: Once | INTRAMUSCULAR | Status: AC
  Administered 2021-05-14: 100 ug via INTRAVENOUS
  Filled 2021-05-14: qty 2

## 2021-05-14 MED ORDER — FUROSEMIDE 10 MG/ML IJ SOLN
40.0000 mg | INTRAMUSCULAR | Status: AC
  Administered 2021-05-14: 40 mg via INTRAVENOUS
  Filled 2021-05-14: qty 4

## 2021-05-14 MED ORDER — SODIUM CHLORIDE 0.9 % IV SOLN: INTRAVENOUS | Status: DC

## 2021-05-14 MED ORDER — CHLORHEXIDINE GLUCONATE 0.12% ORAL RINSE (MEDLINE KIT)
15.0000 mL | Freq: Two times a day (BID) | OROMUCOSAL | Status: DC
  Administered 2021-05-14 – 2021-05-15 (×3): 15 mL via OROMUCOSAL

## 2021-05-14 MED ORDER — POLYETHYLENE GLYCOL 3350 17 G PO PACK: 17.0000 g | PACK | Freq: Every day | ORAL | Status: DC | PRN

## 2021-05-14 MED ORDER — HEPARIN SODIUM (PORCINE) 5000 UNIT/ML IJ SOLN
5000.0000 [IU] | Freq: Three times a day (TID) | INTRAMUSCULAR | Status: DC
  Administered 2021-05-14 – 2021-05-16 (×7): 5000 [IU] via SUBCUTANEOUS
  Filled 2021-05-14 (×7): qty 1

## 2021-05-14 MED ORDER — PANTOPRAZOLE SODIUM 40 MG IV SOLR: 40.0000 mg | Freq: Every day | INTRAVENOUS | Status: DC

## 2021-05-14 MED ORDER — FENTANYL CITRATE (PF) 100 MCG/2ML IJ SOLN
INTRAMUSCULAR | Status: AC
  Administered 2021-05-14: 100 ug
  Filled 2021-05-14: qty 2

## 2021-05-14 MED ORDER — POTASSIUM CHLORIDE 20 MEQ PO PACK
60.0000 meq | PACK | Freq: Once | ORAL | Status: AC
  Administered 2021-05-14: 60 meq
  Filled 2021-05-14: qty 3

## 2021-05-14 MED ORDER — ETOMIDATE 2 MG/ML IV SOLN
INTRAVENOUS | Status: AC | PRN
  Administered 2021-05-14: 20 mg via INTRAVENOUS

## 2021-05-14 MED ORDER — FENTANYL 2500MCG IN NS 250ML (10MCG/ML) PREMIX INFUSION
0.0000 ug/h | INTRAVENOUS | Status: DC
  Administered 2021-05-14: 100 ug/h via INTRAVENOUS
  Filled 2021-05-14: qty 250

## 2021-05-14 MED ORDER — ROCURONIUM BROMIDE 10 MG/ML (PF) SYRINGE
PREFILLED_SYRINGE | INTRAVENOUS | Status: AC
  Filled 2021-05-14: qty 10

## 2021-05-14 MED ORDER — IPRATROPIUM-ALBUTEROL 0.5-2.5 (3) MG/3ML IN SOLN
3.0000 mL | Freq: Four times a day (QID) | RESPIRATORY_TRACT | Status: DC
  Administered 2021-05-14 – 2021-05-15 (×4): 3 mL via RESPIRATORY_TRACT
  Filled 2021-05-14 (×4): qty 3

## 2021-05-14 MED ORDER — SODIUM CHLORIDE 0.9 % IV SOLN
2.0000 g | Freq: Once | INTRAVENOUS | Status: AC
  Administered 2021-05-14: 2 g via INTRAVENOUS
  Filled 2021-05-14: qty 2

## 2021-05-14 MED ORDER — FENTANYL CITRATE (PF) 100 MCG/2ML IJ SOLN
INTRAMUSCULAR | Status: AC
  Filled 2021-05-14: qty 2

## 2021-05-14 MED ORDER — METHYLPREDNISOLONE SODIUM SUCC 125 MG IJ SOLR
80.0000 mg | Freq: Two times a day (BID) | INTRAMUSCULAR | Status: DC
  Administered 2021-05-14 (×2): 80 mg via INTRAVENOUS
  Filled 2021-05-14 (×3): qty 2

## 2021-05-14 MED ORDER — SODIUM CHLORIDE 0.9 % IV SOLN
INTRAVENOUS | Status: AC | PRN
  Administered 2021-05-14: 1000 mL via INTRAVENOUS

## 2021-05-14 MED ORDER — PROPOFOL 1000 MG/100ML IV EMUL: 5.0000 ug/kg/min | INTRAVENOUS | Status: DC

## 2021-05-14 MED ORDER — FENTANYL CITRATE PF 50 MCG/ML IJ SOSY: 50.0000 ug | PREFILLED_SYRINGE | INTRAMUSCULAR | Status: DC | PRN

## 2021-05-14 MED ORDER — ALBUTEROL SULFATE (2.5 MG/3ML) 0.083% IN NEBU
2.5000 mg | INHALATION_SOLUTION | RESPIRATORY_TRACT | Status: DC | PRN
  Filled 2021-05-14: qty 3

## 2021-05-14 MED ORDER — PROPOFOL 1000 MG/100ML IV EMUL
INTRAVENOUS | Status: AC
  Filled 2021-05-14: qty 100

## 2021-05-14 MED ORDER — SODIUM CHLORIDE 0.9 % IV SOLN
2.0000 g | Freq: Three times a day (TID) | INTRAVENOUS | Status: DC
  Administered 2021-05-14 – 2021-05-15 (×2): 2 g via INTRAVENOUS
  Filled 2021-05-14 (×3): qty 2

## 2021-05-14 MED ORDER — ETOMIDATE 2 MG/ML IV SOLN
INTRAVENOUS | Status: AC
  Filled 2021-05-14: qty 20

## 2021-05-14 MED ORDER — DOCUSATE SODIUM 50 MG/5ML PO LIQD: 100.0000 mg | Freq: Two times a day (BID) | ORAL | Status: DC | PRN

## 2021-05-14 MED ORDER — ORAL CARE MOUTH RINSE
15.0000 mL | OROMUCOSAL | Status: DC
  Administered 2021-05-14 – 2021-05-15 (×7): 15 mL via OROMUCOSAL

## 2021-05-14 MED ORDER — SUCCINYLCHOLINE CHLORIDE 20 MG/ML IJ SOLN
INTRAMUSCULAR | Status: AC | PRN
  Administered 2021-05-14: 200 mg via INTRAVENOUS

## 2021-05-14 MED ORDER — DOCUSATE SODIUM 50 MG/5ML PO LIQD
100.0000 mg | Freq: Two times a day (BID) | ORAL | Status: DC
  Administered 2021-05-14: 100 mg
  Filled 2021-05-14: qty 10

## 2021-05-14 MED ORDER — PROPOFOL 1000 MG/100ML IV EMUL
0.0000 ug/kg/min | INTRAVENOUS | Status: DC
  Administered 2021-05-14: 25 ug/kg/min via INTRAVENOUS
  Administered 2021-05-14: 30 ug/kg/min via INTRAVENOUS
  Administered 2021-05-14: 25 ug/kg/min via INTRAVENOUS
  Administered 2021-05-14: 45 ug/kg/min via INTRAVENOUS
  Administered 2021-05-15: 25 ug/kg/min via INTRAVENOUS
  Filled 2021-05-14 (×5): qty 100

## 2021-05-14 MED ORDER — PROPOFOL 1000 MG/100ML IV EMUL
5.0000 ug/kg/min | INTRAVENOUS | Status: DC
  Administered 2021-05-14: 68.871 ug/kg/min via INTRAVENOUS

## 2021-05-14 MED ORDER — FENTANYL CITRATE (PF) 100 MCG/2ML IJ SOLN: 50.0000 ug | INTRAMUSCULAR | Status: DC | PRN

## 2021-05-14 MED ORDER — PANTOPRAZOLE SODIUM 40 MG IV SOLR
40.0000 mg | Freq: Every day | INTRAVENOUS | Status: DC
  Administered 2021-05-14: 40 mg via INTRAVENOUS
  Filled 2021-05-14: qty 40

## 2021-05-14 MED ORDER — SUCCINYLCHOLINE CHLORIDE 200 MG/10ML IV SOSY
PREFILLED_SYRINGE | INTRAVENOUS | Status: AC
  Filled 2021-05-14: qty 10

## 2021-05-14 MED ORDER — POLYETHYLENE GLYCOL 3350 17 G PO PACK: 17.0000 g | PACK | Freq: Every day | ORAL | Status: DC

## 2021-05-14 MED ORDER — MAGNESIUM SULFATE 2 GM/50ML IV SOLN
2.0000 g | Freq: Once | INTRAVENOUS | Status: AC
  Administered 2021-05-14: 2 g via INTRAVENOUS
  Filled 2021-05-14: qty 50

## 2021-05-14 MED ORDER — VANCOMYCIN HCL 1750 MG/350ML IV SOLN
1750.0000 mg | Freq: Once | INTRAVENOUS | Status: AC
  Administered 2021-05-14: 1750 mg via INTRAVENOUS
  Filled 2021-05-14: qty 350

## 2021-05-14 MED ORDER — CHLORHEXIDINE GLUCONATE CLOTH 2 % EX PADS
6.0000 | MEDICATED_PAD | Freq: Every day | CUTANEOUS | Status: DC
  Administered 2021-05-15 – 2021-05-16 (×3): 6 via TOPICAL

## 2021-05-14 MED ORDER — DOCUSATE SODIUM 100 MG PO CAPS: 100.0000 mg | ORAL_CAPSULE | Freq: Two times a day (BID) | ORAL | Status: DC | PRN

## 2021-05-14 NOTE — ED Notes (Signed)
MD Horton speaking to Cornelia Copa (son) and notified them that pt may need to be intubated. Waiting on ABG at this time

## 2021-05-14 NOTE — ED Provider Notes (Signed)
[facility] EMERGENCY DEPARTMENT Provider Note   CSN: [id number] Arrival date & time: 05/14/21  8242     History Chief Complaint  Patient presents with   Unresponsive    [name] [name] is a 60 y.o. female.  HPI     This is a 60 year old female with a history of asthma, hypertension, chronic respiratory failure who presents with altered mental status.  Per EMS, they were called out for altered mental status.  The son had difficulty waking his mother this morning.  They noted her O2 sats to be in the 50s.  They rebounded with a nonrebreather.  Noted that she began to wake up more but was very lethargic.  Noted A. fib with RVR with rates up in the 170s.  No known history.  Spoke with the son on the phone.  No recent illnesses.  Patient is due for sleep study in early November.  Was admitted to [facility] long hospital with respiratory issues about 6 weeks ago.  Level 5 caveat for acuity of condition    Past Medical History:  Diagnosis Date   Asthma    Bloody stool    Dental decay    Gastritis, Helicobacter pylori 35/36/1443   Hypertension    Pneumonia 06/21/2015   Prepyloric ulcer 06/22/2015   Seasonal allergies    Shortness of breath dyspnea    with exertion    Patient Active Problem List   Diagnosis Date Noted   Snoring 03/28/2021   Acute respiratory failure ([place]) 03/19/2021   Chronic left hip pain 03/12/2021   Chronic left shoulder pain 03/12/2021   Mild intermittent asthma without complication 15/40/0867   Lump of axilla, left 03/12/2021   Anxiety with depression 10/06/2017   Healthcare maintenance 10/06/2017   Osteoarthritis, multiple sites 10/06/2017   Alcohol use disorder 07/06/2015   Prepyloric ulcer 06/22/2015   Elevated blood pressure reading with diagnosis of hypertension 04/14/2015   Seasonal allergies 04/14/2015    Past Surgical History:  Procedure Laterality Date   BARTHOLIN CYST MARSUPIALIZATION Left [place]   Repair of PFO   COLONOSCOPY N/A 2002   Performed secondary to mother's diagnosis of colon CA at age 67   ESOPHAGOGASTRODUODENOSCOPY (EGD) WITH PROPOFOL N/A 06/22/2015   Procedure: ESOPHAGOGASTRODUODENOSCOPY (EGD) WITH PROPOFOL;  Surgeon: Arta Silence, MD;  Location: [facility] ENDOSCOPY;  Service: Endoscopy;  Laterality: N/A;   FRACTURE SURGERY     NASAL SINUS SURGERY Bilateral 1984   OVARIAN CYST REMOVAL Left 1990   TUBAL LIGATION     UMBILICAL HERNIA REPAIR N/A 2005     OB History   No obstetric history on file.     Family History  Problem Relation Age of Onset   Colon cancer Mother 50   Hypertension Mother    Hypertension Father    Cerebral aneurysm Sister    Goiter Sister    Breast cancer Neg Hx     Social History   Tobacco Use   Smoking status: Never   Smokeless tobacco: Never  Substance Use Topics   Alcohol use: Yes    Comment: 48 oz malt liquor when drinks, drinks most days.  Difficult to pin down; last drink was 03/13/21.   Drug use: No    Home Medications Prior to Admission medications   Medication Sig Start Date End Date Taking? Authorizing Provider  albuterol (VENTOLIN HFA) 108 (90 Base) MCG/ACT inhaler INHALE 2 PUFFS INTO THE LUNGS EVERY 6 (SIX) HOURS AS NEEDED  FOR WHEEZING OR SHORTNESS OF BREATH. 04/17/20   Kerin Perna, NP  amLODipine (NORVASC) 10 MG tablet TAKE 1 TABLET (10 MG TOTAL) BY MOUTH DAILY. 05/07/21   Dorna Mai, MD  Blood Pressure Monitor KIT 1 kit by Does not apply route 3 (three) times daily as needed. 09/01/19   Kerin Perna, NP  cholecalciferol (VITAMIN D3) 25 MCG (1000 UNIT) tablet Take 1,000 Units by mouth daily.    [provider]  dextromethorphan-guaiFENesin (MUCINEX DM) 30-600 MG 12hr tablet Take 1 tablet by mouth 2 (two) times daily as needed for cough.    [provider]  diclofenac Sodium (VOLTAREN) 1 % GEL APPLY 4 GRAM(S) TOPICALLY TWICE DAILY AS NEEDED Patient taking differently: Apply 4 g  topically 2 (two) times daily as needed (pain). 11/27/20   Kerin Perna, NP  FLUoxetine (PROZAC) 20 MG capsule Take 1 capsule (20 mg total) by mouth daily. 03/11/21   Mayers, Cari S, PA-C  fluticasone furoate-vilanterol (BREO ELLIPTA) 200-25 MCG/INH AEPB Inhale 1 puff into the lungs daily. 03/28/21   Martyn Ehrich, NP  folic acid (FOLVITE) 1 MG tablet Take 1 tablet (1 mg total) by mouth daily. 03/26/21   Oswald Hillock, MD  furosemide (LASIX) 40 MG tablet Take 1 tablet (40 mg total) by mouth daily. 03/25/21 03/25/22  Oswald Hillock, MD  hydrOXYzine (ATARAX/VISTARIL) 25 MG tablet TAKE 1 TABLET (25 MG TOTAL) BY MOUTH EVERY 8 (EIGHT) HOURS AS NEEDED. Patient taking differently: Take 25 mg by mouth every 8 (eight) hours as needed for anxiety. 11/05/20   Kerin Perna, NP  loratadine (CLARITIN) 10 MG tablet Take 1 tablet (10 mg total) by mouth daily. Patient taking differently: Take 10 mg by mouth daily as needed for allergies. 12/14/19   Kerin Perna, NP  potassium chloride SA (KLOR-CON) 20 MEQ tablet Take 1 tablet (20 mEq total) by mouth daily. Take along with lasix 03/25/21   Oswald Hillock, MD  thiamine 100 MG tablet Take 1 tablet (100 mg total) by mouth daily. 03/26/21   Oswald Hillock, MD  traZODone (DESYREL) 50 MG tablet Take 1 tablet (50 mg total) by mouth at bedtime. 03/11/21   Mayers, Cari S, PA-C    Allergies    Tetracyclines & related  Review of Systems   Review of Systems  Unable to perform ROS: Acuity of condition   Physical Exam Updated Vital Signs BP 106/70   Pulse (!) 104   Temp (!) 96.7 F (35.9 C) (Rectal)   Resp (!) 24   Ht 1.6 m ('5\' 3"' )   SpO2 99%   BMI 43.19 kg/m   Physical Exam Vitals and nursing note reviewed.  Constitutional:      Appearance: She is well-developed. She is obese.     Comments: Somnolent, minimally arousable  HENT:     Head: Normocephalic and atraumatic.     Mouth/Throat:     Comments: Drooling Eyes:     Pupils: Pupils are equal,  round, and reactive to light.     Comments: Pupils 3 mm and minimally reactive bilaterally  Cardiovascular:     Rate and Rhythm: Normal rate and regular rhythm.     Heart sounds: Normal heart sounds.  Pulmonary:     Effort: No respiratory distress.     Breath sounds: Rhonchi present. No wheezing.     Comments: Tachypnea, accessory muscle use, distant breath sounds with rhonchi Abdominal:     Palpations: Abdomen is soft.  Tenderness: There is no abdominal tenderness.     Comments: Midline scarring, hernia  Musculoskeletal:     Cervical back: Neck supple.     Right lower leg: No edema.     Left lower leg: No edema.  Skin:    General: Skin is warm and dry.  Neurological:     GCS: GCS eye subscore is 3. GCS verbal subscore is 2. GCS motor subscore is 5.  Psychiatric:     Comments: Unable to assess    ED Results / Procedures / Treatments   Labs (all labs ordered are listed, but only abnormal results are displayed) Labs Reviewed  CBC WITH DIFFERENTIAL/PLATELET - Abnormal; Notable for the following components:      Result Value   Hemoglobin 11.8 (*)    MCHC 29.3 (*)    Neutro Abs 8.9 (*)    Abs Immature Granulocytes 0.09 (*)    All other components within normal limits  COMPREHENSIVE METABOLIC PANEL - Abnormal; Notable for the following components:   Glucose, Bld 266 (*)    Calcium 8.8 (*)    All other components within normal limits  BLOOD GAS, ARTERIAL - Abnormal; Notable for the following components:   pH, Arterial 7.084 (*)    pCO2 arterial >120.0 (*)    Bicarbonate 35.1 (*)    Acid-Base Excess 5.2 (*)    All other components within normal limits  CBG MONITORING, ED - Abnormal; Notable for the following components:   Glucose-Capillary 259 (*)    All other components within normal limits  RESP PANEL BY RT-PCR (FLU A&B, COVID) ARPGX2  CULTURE, BLOOD (ROUTINE X 2)  CULTURE, BLOOD (ROUTINE X 2)  LACTIC ACID, PLASMA  URINALYSIS, ROUTINE W REFLEX MICROSCOPIC  RAPID  URINE DRUG SCREEN, HOSP PERFORMED  LACTIC ACID, PLASMA  BLOOD GAS, ARTERIAL  RAPID URINE DRUG SCREEN, HOSP PERFORMED    EKG EKG Interpretation  Date/Time:  Tuesday May 14 2021 06:52:01 EDT Ventricular Rate:  126 PR Interval:    QRS Duration: 104 QT Interval:  315 QTC Calculation: 423 R Axis:   93 Text Interpretation: Atrial fibrillation Right axis deviation Repol abnrm suggests ischemia, inferior leads Borderline ST elevation, anterior leads Baseline wander in lead(s) V4 No significant change since last tracing Confirmed by Deno Etienne [phone]) on 05/14/2021 7:00:21 AM  Radiology DG Chest Portable 1 View  Result Date: 05/14/2021 CLINICAL DATA:  60 year old female with history of hypoxia. Unresponsive patient. EXAM: PORTABLE CHEST 1 VIEW COMPARISON:  Chest x-ray 03/19/2021. FINDINGS: An endotracheal tube is in place with tip 4.4 cm above the carina. Nasogastric tube extending into the dust stool esophagus, but tube appears coiled back upon itself with tip in the proximal esophagus. Airspace consolidation in the right upper lobe concerning for pneumonia. There is cephalization of the pulmonary vasculature and slight indistinctness of the interstitial markings suggestive of mild pulmonary edema. No definite pleural effusions. Mild cardiomegaly. The patient is rotated to the right on today's exam, resulting in distortion of the mediastinal contours and reduced diagnostic sensitivity and specificity for mediastinal pathology. IMPRESSION: 1. Support apparatus, as above. Please take note of the malpositioned nasogastric tube which is coiled in the esophagus with tip in the proximal esophagus. 2. Right upper lobe airspace consolidation concerning for pneumonia. Followup PA and lateral chest X-ray is recommended in 3-4 weeks following trial of antibiotic therapy to ensure resolution and exclude underlying malignancy. 3. There also is evidence of probable congestive heart failure, as above.  Electronically Signed  By: Vinnie Langton M.D.   On: 05/14/2021 07:31    Procedures Procedure Name: Intubation Date/Time: 05/14/2021 7:29 AM Performed by: Merryl Hacker, MD Oxygen Delivery Method: Non-rebreather mask Induction Type: Rapid sequence Laryngoscope Size: Glidescope and 4 Grade View: Grade I Tube size: 7.5 mm Number of attempts: 1 Placement Confirmation: ETT inserted through vocal cords under direct vision, CO2 detector and Breath sounds checked- equal and bilateral Secured at: 25 cm Tube secured with: ETT holder    .Critical Care Performed by: Merryl Hacker, MD Authorized by: Merryl Hacker, MD   Critical care provider statement:    Critical care time (minutes):  60   Critical care time was exclusive of:  Separately billable procedures and treating other patients   Critical care was necessary to treat or prevent imminent or life-threatening deterioration of the following conditions:  Respiratory failure   Critical care was time spent personally by me on the following activities:  Discussions with consultants, pulse oximetry, ordering and review of radiographic studies, ordering and review of laboratory studies, re-evaluation of patient's condition and review of old charts   Medications Ordered in ED Medications  rocuronium bromide 100 MG/10ML SOSY (has no administration in time range)  succinylcholine (ANECTINE) 200 MG/10ML syringe (has no administration in time range)  etomidate (AMIDATE) 2 MG/ML injection (has no administration in time range)  vancomycin (VANCOCIN) IVPB 1000 mg/200 mL premix (has no administration in time range)  ceFEPIme (MAXIPIME) 1 g in sodium chloride 0.9 % 100 mL IVPB (has no administration in time range)  0.9 %  sodium chloride infusion (1,000 mLs Intravenous New Bag/Given 05/14/21 0708)  etomidate (AMIDATE) injection (20 mg Intravenous Given 05/14/21 0709)  succinylcholine (ANECTINE) injection (200 mg Intravenous Given  05/14/21 0710)  propofol (DIPRIVAN) 1000 MG/100ML infusion (  New Bag/Given 05/14/21 6979)    ED Course  I have reviewed the triage vital signs and the nursing notes.  Pertinent labs & imaging results that were available during my care of the patient were reviewed by me and considered in my medical decision making (see chart for details).    MDM Rules/Calculators/A&P                           Patient presents with altered mental status.  Significantly somnolent.  Requiring oxygen.  Was notably hypoxic by EMS.  She has a GCS of 10.  Highly suspect both hypercarbic and hypoxic respiratory component.  Unclear of what the cause is.  Labs ordered.  She is afebrile.  However, will obtain a lactate and blood cultures as well.  On initial ABG PCO2 is undetectable which means it is greater than 110.  Decision made to intubate.  EKG with A. fib with RVR.  No prior history.  Patient was intubated without difficulty.  CT head and chest x-ray are pending.  Critical care was consulted.  Chest x-ray reviewed.  ET tube in correct position.  Will instruct nursing to reposition OG.  Additionally, concern for pneumonia.  Blood cultures and broad-spectrum antibiotics given to cover for healthcare associated pneumonia.  CHA2DS2-VASc Score = 3  The patient's score is based upon: CHF History: 0 HTN History: 1 Diabetes History: 1 Stroke History: 0 Vascular Disease History: 0 Age Score: 0 Gender Score: 1      ASSESSMENT AND PLAN: Paroxysmal Atrial Fibrillation (ICD10:  I48.0) The patient's CHA2DS2-VASc score is 3, indicating a 3.2% annual risk of stroke.  Signed,  Merryl Hacker, MD    05/14/2021 7:40 AM    Final Clinical Impression(s) / ED Diagnoses Final diagnoses:  Acute on chronic respiratory failure with hypoxia and hypercapnia (HCC)  Atrial fibrillation, unspecified type [facility])    Rx / DC Orders ED Discharge Orders     None        Merryl Hacker, MD 05/14/21 0740

## 2021-05-14 NOTE — ED Notes (Signed)
This RN & RT transporting pt to CT at this time & then to assigned bed 3M10.

## 2021-05-14 NOTE — Progress Notes (Signed)
Date and time results received: 05/14/21 1045   Test: Lactic Acid Critical Value: 3.1  Name of Provider Notified: Dr. Ruthann Cancer  Orders Received? Or Actions Taken?: No new orders at this ime

## 2021-05-14 NOTE — Sedation Documentation (Signed)
7.5 ET tube placed by MD Horton - 25 at lip

## 2021-05-14 NOTE — Progress Notes (Signed)
[place] Progress Note Patient Name: DERHONDA EASTLICK DOB: 1961-05-29 MRN: [id number]   Date of Service  05/14/2021  HPI/Events of Note  Hypokalemia  Hypomagnesemia - K+ = 3.1, Mg++ = 1.4 and Creatinine = 0.83.  eICU Interventions  Will replace K+ and Mg++.     Intervention Category Major Interventions: Electrolyte abnormality - evaluation and management  Casimiro Lienhard Eugene 05/14/2021, 11:02 PM

## 2021-05-14 NOTE — H&P (Signed)
NAME:  [name] [name], MRN:  [id number], DOB:  Jan 12, 1961, LOS: 0 ADMISSION DATE:  05/14/2021, CONSULTATION DATE: 05/14/2021 REFERRING MD: Emergency department physician, CHIEF COMPLAINT: Acute hypercarbic respiratory failure  History of Present Illness:  60 year old female who is well-known to the pulmonary practice and was found by her son on 05/14/2021 obtunded.  Transferred to [facility] emergency department where her PCO2 level was undetectable therefore she was intubated urgently by the emergency department physician.  She has known asthma with untreated obstructive sleep apnea morbid obesity and a grade 2 diastolic dysfunction by 2D echo in August 2022.  At the time of my examination on 05/14/2021 0730 hrs. she was on full mechanical ventilatory support auto peeping 13 and with copious thin white secretions.  Secondary arterial blood gas showed improvement in her hypercarbic respiratory failure with normalization of her pH and her PCO2 now detectable at 67.  She does have an audible component of wheezing therefore suspected bronchospasm which Solu-Medrol was started.  She is also placed on bronchodilators and given 1 dose of Lasix for apparent heart failure.  She has been started on empirical antimicrobial therapy and has been pancultured.  We will admit to the intensive care unit and continue to follow her.  Pertinent  Medical History   Past Medical History:  Diagnosis Date   Asthma    Bloody stool    Dental decay    Gastritis, Helicobacter pylori 29/47/6546   Hypertension    Pneumonia 06/21/2015   Prepyloric ulcer 06/22/2015   Seasonal allergies    Shortness of breath dyspnea    with exertion     Significant Hospital Events: Including procedures, antibiotic start and stop dates in addition to other pertinent events   05/14/2021 intubation  Interim History / Subjective:  60 year old female with known asthma non-smoker who was found by her son obtunded transported to the emergency  department intubated by the emergency department physician  Objective   Blood pressure 96/65, pulse 78, temperature (!) 96.7 F (35.9 C), temperature source Rectal, resp. rate (!) 24, height $RemoveBe'5\' 3"'dkQskIdiy$  (1.6 m), weight 108.9 kg, SpO2 100 %.    Vent Mode: PRVC FiO2 (%):  [100 %] 100 % Set Rate:  [24 bmp] 24 bmp Vt Set:  [420 mL] 420 mL PEEP:  [5 cmH20] 5 cmH20 Plateau Pressure:  [26 cmH20] 26 cmH20  No intake or output data in the 24 hours ending 05/14/21 0832 Filed Weights   05/14/21 0718  Weight: 108.9 kg    Examination: General: Morbid obese female currently on full mechanical ventilatory support and sedated with propofol HENT: Endotracheal tube is in place, orogastric tube is in place.  No JVD or lymphadenopathy is appreciated Lungs: Coarse rhonchi with expiratory wheezing copious thin white secretions more consistent with heart failure than respiratory infection Cardiovascular: Heart sounds are regular regular rate and rhythm Abdomen: Obese soft positive bowel sound Extremities: 1-2+ edema lower extremities Neuro: Currently sedated on propofol withdrawal gag GU: Foley to be placed  Resolved Hospital Problem list     Assessment & Plan:  Acute on chronic hypercarbic respiratory failure in setting of morbid obesity untreated obstructive sleep apnea and known narcotic use.  Questionable infectious process  Admit to the intensive care unit Ventilator adjustments made Bronchodilators Steroids Diuresis Follow-up arterial blood gases have improved Empirical vancomycin and cefepime instituted in the emergency department Panculture Respiratory viral panel   Grade 2 diastolic dysfunction with chronic Lasix use 1 dose of IV Lasix Monitor blood pressure in  the intensive care May need central line to monitor central venous pressure  Known history of anxiety, alcohol abuse, suspected narcotic use. Urine drug screen Anxiolytics as needed   Best Practice (right click and "Reselect  all SmartList Selections" daily)   Diet/type: NPO DVT prophylaxis: prophylactic heparin  GI prophylaxis: N/A and PPI Lines: N/A Foley:  N/A Code Status:  full code Last date of multidisciplinary goals of care discussion [tbd]  Labs   CBC: Recent Labs  Lab 05/14/21 0654  WBC 10.1  NEUTROABS 8.9*  HGB 11.8*  HCT 40.3  MCV 92.6  PLT 431    Basic Metabolic Panel: Recent Labs  Lab 05/14/21 0654  NA 139  K 4.0  CL 98  CO2 31  GLUCOSE 266*  BUN 7  CREATININE 0.64  CALCIUM 8.8*   GFR: Estimated Creatinine Clearance: 88.5 mL/min (by C-G formula based on SCr of 0.64 mg/dL). Recent Labs  Lab 05/14/21 0654  WBC 10.1  LATICACIDVEN 1.5    Liver Function Tests: Recent Labs  Lab 05/14/21 0654  AST 17  ALT 18  ALKPHOS 61  BILITOT 1.0  PROT 7.3  ALBUMIN 3.6   No results for input(s): LIPASE, AMYLASE in the last 168 hours. No results for input(s): AMMONIA in the last 168 hours.  ABG    Component Value Date/Time   PHART 7.295 (L) 05/14/2021 0759   PCO2ART 65.6 (HH) 05/14/2021 0759   PO2ART 117 (H) 05/14/2021 0759   HCO3 31.4 (H) 05/14/2021 0759   TCO2 28 01/01/2011 1532   O2SAT 98.7 05/14/2021 0759     Coagulation Profile: No results for input(s): INR, PROTIME in the last 168 hours.  Cardiac Enzymes: No results for input(s): CKTOTAL, CKMB, CKMBINDEX, TROPONINI in the last 168 hours.  HbA1C: Hgb A1c MFr Bld  Date/Time Value Ref Range Status  03/19/2021 05:26 AM 5.8 (H) 4.8 - 5.6 % Final    Comment:    (NOTE)         Prediabetes: 5.7 - 6.4         Diabetes: >6.4         Glycemic control for adults with diabetes: <7.0   02/10/2019 10:45 AM 5.3 4.8 - 5.6 % Final    Comment:             Prediabetes: 5.7 - 6.4          Diabetes: >6.4          Glycemic control for adults with diabetes: <7.0     CBG: Recent Labs  Lab 05/14/21 0652  GLUCAP 259*    Review of Systems:   na  Past Medical History:  She,  has a past medical history of Asthma,  Bloody stool, Dental decay, Gastritis, Helicobacter pylori (54/00/8676), Hypertension, Pneumonia (06/21/2015), Prepyloric ulcer (06/22/2015), Seasonal allergies, and Shortness of breath dyspnea.   Surgical History:   Past Surgical History:  Procedure Laterality Date   BARTHOLIN CYST MARSUPIALIZATION Left 1997   CARDIAC SURGERY N/A 1963   Repair of PFO   COLONOSCOPY N/A 2002   Performed secondary to mother's diagnosis of colon CA at age 68   ESOPHAGOGASTRODUODENOSCOPY (EGD) WITH PROPOFOL N/A 06/22/2015   Procedure: ESOPHAGOGASTRODUODENOSCOPY (EGD) WITH PROPOFOL;  Surgeon: Arta Silence, MD;  Location: [facility] ENDOSCOPY;  Service: Endoscopy;  Laterality: N/A;   FRACTURE SURGERY     NASAL SINUS SURGERY Bilateral 1984   OVARIAN CYST REMOVAL Left 1990   TUBAL LIGATION     UMBILICAL HERNIA REPAIR N/A 2005  Social History:   reports that she has never smoked. She has never used smokeless tobacco. She reports current alcohol use. She reports that she does not use drugs.   Family History:  Her family history includes Cerebral aneurysm in her sister; Colon cancer (age of onset: 37) in her mother; Goiter in her sister; Hypertension in her father and mother. There is no history of Breast cancer.   Allergies Allergies  Allergen Reactions   Tetracyclines & Related Itching     Home Medications  Prior to Admission medications   Medication Sig Start Date End Date Taking? Authorizing Provider  albuterol (VENTOLIN HFA) 108 (90 Base) MCG/ACT inhaler INHALE 2 PUFFS INTO THE LUNGS EVERY 6 (SIX) HOURS AS NEEDED FOR WHEEZING OR SHORTNESS OF BREATH. 04/17/20   Kerin Perna, NP  amLODipine (NORVASC) 10 MG tablet TAKE 1 TABLET (10 MG TOTAL) BY MOUTH DAILY. 05/07/21   Dorna Mai, MD  Blood Pressure Monitor KIT 1 kit by Does not apply route 3 (three) times daily as needed. 09/01/19   Kerin Perna, NP  cholecalciferol (VITAMIN D3) 25 MCG (1000 UNIT) tablet Take 1,000 Units by mouth daily.     [provider]  dextromethorphan-guaiFENesin (MUCINEX DM) 30-600 MG 12hr tablet Take 1 tablet by mouth 2 (two) times daily as needed for cough.    [provider]  diclofenac Sodium (VOLTAREN) 1 % GEL APPLY 4 GRAM(S) TOPICALLY TWICE DAILY AS NEEDED Patient taking differently: Apply 4 g topically 2 (two) times daily as needed (pain). 11/27/20   Kerin Perna, NP  FLUoxetine (PROZAC) 20 MG capsule Take 1 capsule (20 mg total) by mouth daily. 03/11/21   Mayers, Cari S, PA-C  fluticasone furoate-vilanterol (BREO ELLIPTA) 200-25 MCG/INH AEPB Inhale 1 puff into the lungs daily. 03/28/21   Martyn Ehrich, NP  folic acid (FOLVITE) 1 MG tablet Take 1 tablet (1 mg total) by mouth daily. 03/26/21   Oswald Hillock, MD  furosemide (LASIX) 40 MG tablet Take 1 tablet (40 mg total) by mouth daily. 03/25/21 03/25/22  Oswald Hillock, MD  hydrOXYzine (ATARAX/VISTARIL) 25 MG tablet TAKE 1 TABLET (25 MG TOTAL) BY MOUTH EVERY 8 (EIGHT) HOURS AS NEEDED. Patient taking differently: Take 25 mg by mouth every 8 (eight) hours as needed for anxiety. 11/05/20   Kerin Perna, NP  loratadine (CLARITIN) 10 MG tablet Take 1 tablet (10 mg total) by mouth daily. Patient taking differently: Take 10 mg by mouth daily as needed for allergies. 12/14/19   Kerin Perna, NP  potassium chloride SA (KLOR-CON) 20 MEQ tablet Take 1 tablet (20 mEq total) by mouth daily. Take along with lasix 03/25/21   Oswald Hillock, MD  thiamine 100 MG tablet Take 1 tablet (100 mg total) by mouth daily. 03/26/21   Oswald Hillock, MD  traZODone (DESYREL) 50 MG tablet Take 1 tablet (50 mg total) by mouth at bedtime. 03/11/21   Mayers, Loraine Grip, PA-C     Critical care time: 75 min    Richardson Landry Lilliam Chamblee ACNP Acute Care Nurse Practitioner [place] Please consult Amion 05/14/2021, 8:32 AM

## 2021-05-14 NOTE — Progress Notes (Signed)
Pharmacy Antibiotic Note  [name] [name] is a 60 y.o. female admitted on 05/14/2021 with acute hypercarbic respiratory failure.  Pharmacy has been consulted for cefepime dosing for a questionable infectious process.  SCr 0.64, CrCL 88 ml/min, afebrile, WBC WNL, PCT negative.  Plan: Cefepime 2gm IV Q8H Monitor renal fxn, clinical progress to de-escalate  Height: 5\' 3"  (160 cm) Weight: 108.9 kg (240 lb) IBW/kg (Calculated) : 52.4  Temp (24hrs), Avg:96.5 F (35.8 C), Min:94.2 F (34.6 C), Max:98.7 F (37.1 C)  Recent Labs  Lab 05/14/21 0654 05/14/21 0935 05/14/21 1128  WBC 10.1  --   --   CREATININE 0.64  --   --   LATICACIDVEN 1.5 3.1* 2.0*    Estimated Creatinine Clearance: 88.5 mL/min (by C-G formula based on SCr of 0.64 mg/dL).    Allergies  Allergen Reactions   Tetracyclines & Related Itching    Cefepime 10/25 >> Vanc x1 10/25 >>  10/25 MRSA PCR - negative 10/25 BCx -   Pape Parson D. Mina Marble, PharmD, BCPS, [place] 05/14/2021, 3:41 PM

## 2021-05-14 NOTE — Sedation Documentation (Signed)
Xray called to verify placement

## 2021-05-14 NOTE — ED Triage Notes (Signed)
Pt BIB EMS - Son called out because he could not wake patient. EMS found pt gasping for air, no lung sounds. Placed pt on NRB - pt came up to 95% and started to "wake up some" but pt still very lethargic - lungs very diminished.  Pt is in abfib RVR with rates up to 170 Per EMS pt has hx of resp issues and heart issues but son could not specify  [facility]

## 2021-05-14 NOTE — Progress Notes (Signed)
[place] Progress Note Patient Name: [name] [name] DOB: 1960/11/15 MRN: [id number]   Date of Service  05/14/2021  HPI/Events of Note  Ventricular Trigeminy  eICU Interventions  Plan: BMP and Mg++ level STAT.     Intervention Category Major Interventions: Arrhythmia - evaluation and management  Jamin Humphries [place] 05/14/2021, 9:08 PM

## 2021-05-14 NOTE — Progress Notes (Signed)
Patient transported to CT and then to 5U94 without complications. RN at bedside.

## 2021-05-15 DIAGNOSIS — J9602 Acute respiratory failure with hypercapnia: Secondary | ICD-10-CM | POA: Diagnosis not present

## 2021-05-15 DIAGNOSIS — J9601 Acute respiratory failure with hypoxia: Secondary | ICD-10-CM | POA: Diagnosis not present

## 2021-05-15 LAB — BLOOD GAS, ARTERIAL
Acid-Base Excess: 2.9 mmol/L — ABNORMAL HIGH (ref 0.0–2.0)
Bicarbonate: 25.9 mmol/L (ref 20.0–28.0)
Drawn by: 38235
FIO2: 50
O2 Saturation: 93.9 %
Patient temperature: 37
pCO2 arterial: 32.7 mmHg (ref 32.0–48.0)
pH, Arterial: 7.51 — ABNORMAL HIGH (ref 7.350–7.450)
pO2, Arterial: 76.4 mmHg — ABNORMAL LOW (ref 83.0–108.0)

## 2021-05-15 LAB — RESPIRATORY PANEL BY PCR

## 2021-05-15 LAB — BASIC METABOLIC PANEL
Anion gap: 11 (ref 5–15)
BUN: 11 mg/dL (ref 6–20)
CO2: 25 mmol/L (ref 22–32)
Calcium: 9.2 mg/dL (ref 8.9–10.3)
Chloride: 105 mmol/L (ref 98–111)
Creatinine, Ser: 0.87 mg/dL (ref 0.44–1.00)
GFR, Estimated: >60 mL/min (ref >60)
Glucose, Bld: 200 mg/dL — ABNORMAL HIGH (ref 70–99)
Potassium: 3.4 mmol/L — ABNORMAL LOW (ref 3.5–5.1)
Sodium: 141 mmol/L (ref 135–145)

## 2021-05-15 LAB — CBC
HCT: 33.9 % — ABNORMAL LOW (ref 36.0–46.0)
Hemoglobin: 11.1 g/dL — ABNORMAL LOW (ref 12.0–15.0)
MCH: 27.5 pg (ref 26.0–34.0)
MCHC: 32.7 g/dL (ref 30.0–36.0)
MCV: 84.1 fL (ref 80.0–100.0)
Platelets: 288 10*3/uL (ref 150–400)
RBC: 4.03 MIL/uL (ref 3.87–5.11)
RDW: 15.5 % (ref 11.5–15.5)
WBC: 11 10*3/uL — ABNORMAL HIGH (ref 4.0–10.5)
nRBC: 0 % (ref 0.0–0.2)

## 2021-05-15 LAB — GLUCOSE, CAPILLARY
Glucose-Capillary: 188 mg/dL — ABNORMAL HIGH (ref 70–99)
Glucose-Capillary: 221 mg/dL — ABNORMAL HIGH (ref 70–99)
Glucose-Capillary: 159 mg/dL — ABNORMAL HIGH (ref 70–99)
Glucose-Capillary: 167 mg/dL — ABNORMAL HIGH (ref 70–99)
Glucose-Capillary: 147 mg/dL — ABNORMAL HIGH (ref 70–99)
Glucose-Capillary: 182 mg/dL — ABNORMAL HIGH (ref 70–99)

## 2021-05-15 LAB — HEMOGLOBIN A1C
Hgb A1c MFr Bld: 6 % — ABNORMAL HIGH (ref 4.8–5.6)
Mean Plasma Glucose: 125.5 mg/dL

## 2021-05-15 LAB — C-REACTIVE PROTEIN: CRP: 1.5 mg/dL — ABNORMAL HIGH (ref <1.0)

## 2021-05-15 LAB — TRIGLYCERIDES: Triglycerides: 99 mg/dL (ref <150)

## 2021-05-15 LAB — SEDIMENTATION RATE: Sed Rate: 22 mm/hr (ref 0–22)

## 2021-05-15 LAB — PHOSPHORUS: Phosphorus: 1.7 mg/dL — ABNORMAL LOW (ref 2.5–4.6)

## 2021-05-15 LAB — MAGNESIUM: Magnesium: 2.1 mg/dL (ref 1.7–2.4)

## 2021-05-15 MED ORDER — OXYCODONE-ACETAMINOPHEN 5-325 MG PO TABS
1.0000 | ORAL_TABLET | Freq: Four times a day (QID) | ORAL | Status: DC | PRN
  Administered 2021-05-15 (×2): 1 via ORAL
  Filled 2021-05-15 (×3): qty 1

## 2021-05-15 MED ORDER — AZITHROMYCIN 500 MG IV SOLR
500.0000 mg | INTRAVENOUS | Status: DC
  Administered 2021-05-15: 500 mg via INTRAVENOUS
  Filled 2021-05-15: qty 500

## 2021-05-15 MED ORDER — POLYETHYLENE GLYCOL 3350 17 G PO PACK: 17.0000 g | PACK | Freq: Every day | ORAL | Status: DC | PRN

## 2021-05-15 MED ORDER — TRAZODONE HCL 50 MG PO TABS
25.0000 mg | ORAL_TABLET | Freq: Once | ORAL | Status: AC
  Administered 2021-05-15: 25 mg via ORAL
  Filled 2021-05-15: qty 1

## 2021-05-15 MED ORDER — WHITE PETROLATUM EX OINT
TOPICAL_OINTMENT | CUTANEOUS | Status: AC
  Filled 2021-05-15: qty 28.35

## 2021-05-15 MED ORDER — FUROSEMIDE 10 MG/ML IJ SOLN
40.0000 mg | Freq: Two times a day (BID) | INTRAMUSCULAR | Status: DC
  Administered 2021-05-15 – 2021-05-16 (×3): 40 mg via INTRAVENOUS
  Filled 2021-05-15 (×3): qty 4

## 2021-05-15 MED ORDER — SODIUM CHLORIDE 0.9 % IV SOLN
2.0000 g | INTRAVENOUS | Status: DC
  Administered 2021-05-15 – 2021-05-16 (×2): 2 g via INTRAVENOUS
  Filled 2021-05-15 (×2): qty 20

## 2021-05-15 MED ORDER — POTASSIUM PHOSPHATES 15 MMOLE/5ML IV SOLN
30.0000 mmol | Freq: Once | INTRAVENOUS | Status: AC
  Administered 2021-05-15: 30 mmol via INTRAVENOUS
  Filled 2021-05-15: qty 10

## 2021-05-15 MED ORDER — DOCUSATE SODIUM 100 MG PO CAPS
100.0000 mg | ORAL_CAPSULE | Freq: Two times a day (BID) | ORAL | Status: DC
  Administered 2021-05-15: 100 mg via ORAL
  Filled 2021-05-15: qty 1

## 2021-05-15 MED ORDER — INSULIN ASPART 100 UNIT/ML IJ SOLN
0.0000 [IU] | Freq: Three times a day (TID) | INTRAMUSCULAR | Status: DC
  Administered 2021-05-15 (×2): 3 [IU] via SUBCUTANEOUS

## 2021-05-15 MED ORDER — POTASSIUM CHLORIDE CRYS ER 20 MEQ PO TBCR
40.0000 meq | EXTENDED_RELEASE_TABLET | Freq: Once | ORAL | Status: AC
  Administered 2021-05-15: 40 meq via ORAL
  Filled 2021-05-15: qty 2

## 2021-05-15 MED ORDER — METHYLPREDNISOLONE SODIUM SUCC 125 MG IJ SOLR
110.0000 mg | Freq: Two times a day (BID) | INTRAMUSCULAR | Status: DC
  Administered 2021-05-15: 110 mg via INTRAVENOUS

## 2021-05-15 MED ORDER — GUAIFENESIN ER 600 MG PO TB12
600.0000 mg | ORAL_TABLET | Freq: Two times a day (BID) | ORAL | Status: DC
  Administered 2021-05-15 – 2021-05-16 (×3): 600 mg via ORAL
  Filled 2021-05-15 (×3): qty 1

## 2021-05-15 MED ORDER — AZITHROMYCIN 500 MG PO TABS
500.0000 mg | ORAL_TABLET | Freq: Every day | ORAL | Status: DC
  Administered 2021-05-16: 500 mg via ORAL
  Filled 2021-05-15: qty 1

## 2021-05-15 MED ORDER — METHYLPREDNISOLONE SODIUM SUCC 125 MG IJ SOLR
60.0000 mg | Freq: Two times a day (BID) | INTRAMUSCULAR | Status: DC
  Administered 2021-05-16: 60 mg via INTRAVENOUS
  Filled 2021-05-15: qty 2

## 2021-05-15 MED ORDER — IPRATROPIUM-ALBUTEROL 0.5-2.5 (3) MG/3ML IN SOLN
3.0000 mL | Freq: Two times a day (BID) | RESPIRATORY_TRACT | Status: DC
  Administered 2021-05-15 – 2021-05-16 (×2): 3 mL via RESPIRATORY_TRACT
  Filled 2021-05-15 (×2): qty 3

## 2021-05-15 MED ORDER — POLYETHYLENE GLYCOL 3350 17 G PO PACK
17.0000 g | PACK | Freq: Every day | ORAL | Status: DC
  Filled 2021-05-15: qty 1

## 2021-05-15 MED ORDER — DOCUSATE SODIUM 100 MG PO CAPS: 100.0000 mg | ORAL_CAPSULE | Freq: Two times a day (BID) | ORAL | Status: DC | PRN

## 2021-05-15 NOTE — Progress Notes (Signed)
[facility] Progress Note Patient Name: [name] [name] DOB: 1961-01-07 MRN: [id number]   Date of Service  05/15/2021  HPI/Events of Note  Insomnia, takes trazodone at home  eICU Interventions  Low dose x 1 ordered     Intervention Category Intermediate Interventions: Other: Minor Interventions: Routine modifications to care plan (e.g. PRN medications for pain, fever)  Matha Masse G Hyde Sires 05/15/2021, 11:26 PM

## 2021-05-15 NOTE — Progress Notes (Signed)
NAME:  [name] [name], MRN:  [id number], DOB:  07-31-1960, LOS: 1 ADMISSION DATE:  05/14/2021, CONSULTATION DATE: 05/14/2021 REFERRING MD: Emergency department physician, CHIEF COMPLAINT: Acute hypercarbic respiratory failure  History of Present Illness:  60 year old female who is well-known to the pulmonary practice and was found by her son on 05/14/2021 obtunded.  Transferred to [facility] emergency department where her PCO2 level was undetectable therefore she was intubated urgently by the emergency department physician.  She has known asthma with untreated obstructive sleep apnea morbid obesity and a grade 2 diastolic dysfunction by 2D echo in August 2022.  At the time of my examination on 05/14/2021 0730 hrs. she was on full mechanical ventilatory support auto peeping 13 and with copious thin white secretions.  Secondary arterial blood gas showed improvement in her hypercarbic respiratory failure with normalization of her pH and her PCO2 now detectable at 67.  She does have an audible component of wheezing therefore suspected bronchospasm which Solu-Medrol was started.  She is also placed on bronchodilators and given 1 dose of Lasix for apparent heart failure.  She has been started on empirical antimicrobial therapy and has been pancultured.  We will admit to the intensive care unit and continue to follow her.  Pertinent  Medical History   Past Medical History:  Diagnosis Date   Asthma    Bloody stool    Dental decay    Gastritis, Helicobacter pylori 84/13/2440   Hypertension    Pneumonia 06/21/2015   Prepyloric ulcer 06/22/2015   Seasonal allergies    Shortness of breath dyspnea    with exertion     Significant Hospital Events: Including procedures, antibiotic start and stop dates in addition to other pertinent events   05/14/2021 intubation  Interim History / Subjective:  Patient remains intubated on full mechanical ventilation, sedated on propofol.  Objective   Blood pressure  132/86, pulse 79, temperature 98.8 F (37.1 C), temperature source Axillary, resp. rate (!) 26, height '5\' 3"'  (1.6 m), weight 110.3 kg, SpO2 96 %.    Vent Mode: PRVC FiO2 (%):  [50 %-100 %] 50 % Set Rate:  [22 bmp-26 bmp] 22 bmp Vt Set:  [420 mL] 420 mL PEEP:  [5 cmH20] 5 cmH20 Plateau Pressure:  [19 cmH20-29 cmH20] 20 cmH20   Intake/Output Summary (Last 24 hours) at 05/15/2021 0711 Last data filed at 05/15/2021 0600 Gross per 24 hour  Intake 376.89 ml  Output 450 ml  Net -73.11 ml   Filed Weights   05/14/21 0718 05/15/21 0500  Weight: 108.9 kg 110.3 kg    Examination: General: Morbid obese female currently on full mechanical ventilatory support and sedated with propofol HENT: Endotracheal tube is in place, orogastric tube is in place.  No JVD or lymphadenopathy is appreciated Lungs: Coarse rhonchi, improved wheezing  Cardiovascular: RRR, no murmurs Abdomen: Obese soft positive bowel sound Extremities: nonpitting edema lower extremities Neuro: Currently sedated on propofol withdrawal gag GU: Foley   Labs- ABG 7.5/32 point 7/76/20 5.9 WBC 11 K3.4 Mag 2.1 Phos 1.7 Resolved Hospital Problem list     Assessment & Plan:  Acute on chronic hypercarbic respiratory failure in setting of morbid obesity untreated obstructive sleep apnea and known narcotic use ABG showed pH of 7.5, PCO2 32.7, PO2 76, bicarb 25.9.  Remains on full support mechanical ventilation and sedated on propofol.  Chest radiograph shows a right upper lobe pneumonia and pulmonary edema.  Blood cultures pending.  Will de-escalate antibiotics for CAP coverage. MRSA negative. Also  check an ANA w/ reflex based on previous CT chest concerning for ILD.  Will need outpatient imaging follow up.  Plan to wean sedation and optimize for extubation today. -Daily SAT and SBT -Pulmonary hygiene -Continue bronchodilators and steroids -Continue diuresis -De-escalate broad-spectrum antibiotics for CAP coverage -Follow  respiratory viral panel and blood cultures -Follow-up ANA with reflex  Grade 2 diastolic dysfunction with chronic Lasix use Status post 1 dose of IV Lasix in the ER.  Urinary output has been insignificant, 450 cc.  Does appear to be volume up on exam, chest radiograph consistent with pulmonary edema.  Last echocardiogram 03/19/2021 with LVEF 60 to 65%, no regional wall abnormalities, left ventricular hypertrophy and grade 2 diastolic dysfunction. -Continue IV Lasix -Strict ins and outs -Maintain K greater than 4, mag greater than 2  Hx of anxiety, alcohol abuse, suspected narcotic use Urine drug screen negative. -Anxiolytics as needed  Electrolyte derangements Low potassium, phosphorus and magnesium all repleted. -Trend BMP and replete as needed  Best Practice (right click and "Reselect all SmartList Selections" daily)   Diet/type: NPO DVT prophylaxis: prophylactic heparin  GI prophylaxis: N/A and PPI Lines: N/A Foley:  N/A Code Status:  full code Last date of multidisciplinary goals of care discussion 10/26  Labs   CBC: Recent Labs  Lab 05/14/21 0654 05/15/21 0438  WBC 10.1 11.0*  NEUTROABS 8.9*  --   HGB 11.8* 11.1*  HCT 40.3 33.9*  MCV 92.6 84.1  PLT 277 440    Basic Metabolic Panel: Recent Labs  Lab 05/14/21 0654 05/14/21 2149 05/15/21 0438  NA 139 142 141  K 4.0 3.1* 3.4*  CL 98 103 105  CO2 '31 27 25  ' GLUCOSE 266* 149* 200*  BUN '7 9 11  ' CREATININE 0.64 0.83 0.87  CALCIUM 8.8* 9.1 9.2  MG 1.7 1.4* 2.1  PHOS 6.0*  --  1.7*   GFR: Estimated Creatinine Clearance: 82.1 mL/min (by C-G formula based on SCr of 0.87 mg/dL). Recent Labs  Lab 05/14/21 0654 05/14/21 0935 05/14/21 1128 05/15/21 0438  PROCALCITON <0.10  --   --   --   WBC 10.1  --   --  11.0*  LATICACIDVEN 1.5 3.1* 2.0*  --     Liver Function Tests: Recent Labs  Lab 05/14/21 0654  AST 17  ALT 18  ALKPHOS 61  BILITOT 1.0  PROT 7.3  ALBUMIN 3.6   Recent Labs  Lab 05/14/21 0654   LIPASE 20  AMYLASE 24*   No results for input(s): AMMONIA in the last 168 hours.  ABG    Component Value Date/Time   PHART 7.510 (H) 05/15/2021 0544   PCO2ART 32.7 05/15/2021 0544   PO2ART 76.4 (L) 05/15/2021 0544   HCO3 25.9 05/15/2021 0544   TCO2 28 01/01/2011 1532   O2SAT 93.9 05/15/2021 0544     Coagulation Profile: Recent Labs  Lab 05/14/21 1128  INR 1.0    Cardiac Enzymes: No results for input(s): CKTOTAL, CKMB, CKMBINDEX, TROPONINI in the last 168 hours.  HbA1C: Hgb A1c MFr Bld  Date/Time Value Ref Range Status  03/19/2021 05:26 AM 5.8 (H) 4.8 - 5.6 % Final    Comment:    (NOTE)         Prediabetes: 5.7 - 6.4         Diabetes: >6.4         Glycemic control for adults with diabetes: <7.0   02/10/2019 10:45 AM 5.3 4.8 - 5.6 % Final    Comment:  Prediabetes: 5.7 - 6.4          Diabetes: >6.4          Glycemic control for adults with diabetes: <7.0     CBG: Recent Labs  Lab 05/14/21 1115 05/14/21 1519 05/14/21 1934 05/14/21 2318 05/15/21 0337  GLUCAP 176* 115* 126* 163* 188*    Review of Systems:   na  Past Medical History:  She,  has a past medical history of Asthma, Bloody stool, Dental decay, Gastritis, Helicobacter pylori (08/20/4386), Hypertension, Pneumonia (06/21/2015), Prepyloric ulcer (06/22/2015), Seasonal allergies, and Shortness of breath dyspnea.   Surgical History:   Past Surgical History:  Procedure Laterality Date   BARTHOLIN CYST MARSUPIALIZATION Left 1997   CARDIAC SURGERY N/A 1963   Repair of PFO   COLONOSCOPY N/A 2002   Performed secondary to mother's diagnosis of colon CA at age 68   ESOPHAGOGASTRODUODENOSCOPY (EGD) WITH PROPOFOL N/A 06/22/2015   Procedure: ESOPHAGOGASTRODUODENOSCOPY (EGD) WITH PROPOFOL;  Surgeon: Arta Silence, MD;  [facility]: [facility] END[facility]COPY;  Service: Endoscopy;  Laterality: N/A;   FRACTURE SURGERY     NASAL SINUS SURGERY Bilateral 1984   OVARIAN CYST REMOVAL Left 1990   TUBAL LIGATION      UMBILICAL HERNIA REPAIR N/A 2005     Social History:   reports that she has never smoked. She has never used smokeless tobacco. She reports current alcohol use. She reports that she does not use drugs.   Family History:  Her family history includes Cerebral aneurysm in her sister; Colon cancer (age of onset: 42) in her mother; Goiter in her sister; Hypertension in her father and mother. There is no history of Breast cancer.   Allergies Allergies  Allergen Reactions   Tetracyclines & Related Itching     Home Medications  Prior to Admission medications   Medication Sig Start Date End Date Taking? Authorizing Provider  albuterol (VENTOLIN HFA) 108 (90 Base) MCG/ACT inhaler INHALE 2 PUFFS INTO THE LUNGS EVERY 6 (SIX) HOURS AS NEEDED FOR WHEEZING OR SHORTNESS OF BREATH. 04/17/20   Kerin Perna, NP  amLODipine (NORVASC) 10 MG tablet TAKE 1 TABLET (10 MG TOTAL) BY MOUTH DAILY. 05/07/21   Dorna Mai, MD  Blood Pressure Monitor KIT 1 kit by Does not apply route 3 (three) times daily as needed. 09/01/19   Kerin Perna, NP  cholecalciferol (VITAMIN D3) 25 MCG (1000 UNIT) tablet Take 1,000 Units by mouth daily.    [provider]  dextromethorphan-guaiFENesin (MUCINEX DM) 30-600 MG 12hr tablet Take 1 tablet by mouth 2 (two) times daily as needed for cough.    [provider]  diclofenac Sodium (VOLTAREN) 1 % GEL APPLY 4 GRAM(S) TOPICALLY TWICE DAILY AS NEEDED Patient taking differently: Apply 4 g topically 2 (two) times daily as needed (pain). 11/27/20   Kerin Perna, NP  FLUoxetine (PROZAC) 20 MG capsule Take 1 capsule (20 mg total) by mouth daily. 03/11/21   Mayers, Cari S, PA-C  fluticasone furoate-vilanterol (BREO ELLIPTA) 200-25 MCG/INH AEPB Inhale 1 puff into the lungs daily. 03/28/21   Martyn Ehrich, NP  folic acid (FOLVITE) 1 MG tablet Take 1 tablet (1 mg total) by mouth daily. 03/26/21   Oswald Hillock, MD  furosemide (LASIX) 40 MG tablet Take 1 tablet  (40 mg total) by mouth daily. 03/25/21 03/25/22  Oswald Hillock, MD  hydrOXYzine (ATARAX/VISTARIL) 25 MG tablet TAKE 1 TABLET (25 MG TOTAL) BY MOUTH EVERY 8 (EIGHT) HOURS AS NEEDED. Patient taking differently: Take  25 mg by mouth every 8 (eight) hours as needed for anxiety. 11/05/20   Kerin Perna, NP  loratadine (CLARITIN) 10 MG tablet Take 1 tablet (10 mg total) by mouth daily. Patient taking differently: Take 10 mg by mouth daily as needed for allergies. 12/14/19   Kerin Perna, NP  potassium chloride SA (KLOR-CON) 20 MEQ tablet Take 1 tablet (20 mEq total) by mouth daily. Take along with lasix 03/25/21   Oswald Hillock, MD  thiamine 100 MG tablet Take 1 tablet (100 mg total) by mouth daily. 03/26/21   Oswald Hillock, MD  traZODone (DESYREL) 50 MG tablet Take 1 tablet (50 mg total) by mouth at bedtime. 03/11/21   Mayers, Cari S, PA-C    Kyzen Horn, DO PGY-3 IMTS

## 2021-05-15 NOTE — TOC Initial Note (Signed)
Transition of Care [facility]) - Initial/Assessment Note    Patient Details  Name: [name] [name] MRN: [id number] Date of Birth: 05-27-1961  Transition of Care [facility]) CM/SW Contact:    Tom-Johnson, Renea Ee, RN Phone Number: 05/15/2021, 5:46 PM  Clinical Narrative:                 CM spoke with patient at bedside with sister, [name] [name] in room. Patient presented to the ED after being found obtunded by her son. Patient was intubated, now extubated and on 4 L oxygen nasal cannula. Patient states she lives in a public housing with her son. States she is not supposed to do that and don't want to get in trouble. States son gets aggressive with arguments and it's affecting her health. States she has four children and the other three children seldomly comes around. CM asked patient if the children don't come around because of her or her son. Patient states both. Sister joined in the conversation and states she is a Patent examiner and will have patient write up a letter for 30 days notice of eviction and she will notarize it and give it to patient's son. CM asked patient if she wants the issue reported and patient started crying again, stating he has nowhere to go to. Patient states she will go to her sister's house at discharge to recover before going home. Patient is on disability. Able to drive self. Worked for [employer]. Has Juluis Mire as PCP. Uses [facility] for her meds and has [facility] Medicare. Uses home oxygen and gets supplies from [facility] care. Awaiting PT/OT eval for safe discharge disposition. CM will continue to follow with needs.      Barriers to Discharge: Continued Medical Work up   Patient Goals and CMS Choice Patient states their goals for this hospitalization and ongoing recovery are:: To to sister's house to recover. CMS Medicare.gov Compare Post Acute Care list provided to:: Patient    Expected Discharge Plan and Services     Discharge Planning Services: CM  Consult   Living arrangements for the past 2 months: Apartment                                      Prior Living Arrangements/Services Living arrangements for the past 2 months: Apartment Lives with:: Adult Children (Son) Patient language and need for interpreter reviewed:: Yes Do you feel safe going back to the place where you live?: Yes      Need for Family Participation in Patient Care: Yes (Comment) Care giver support system in place?: Yes (comment) Current home services: DME Kasandra Knudsen) Criminal Activity/Legal Involvement Pertinent to Current Situation/Hospitalization: No - Comment as needed  Activities of Daily Living      Permission Sought/Granted Permission sought to share information with : Case Manager, Family Supports Permission granted to share information with : Yes, Verbal Permission Granted              Emotional Assessment Appearance:: Appears stated age Attitude/Demeanor/Rapport: Engaged Affect (typically observed): Accepting, Tearful/Crying Orientation: : Oriented to Self, Oriented to Place, Oriented to  Time, Oriented to Situation Alcohol / Substance Use: Not Applicable    Admission diagnosis:  Respiratory failure ([place]) [J96.90] Acute on chronic respiratory failure with hypoxia and hypercapnia (HCC) [J96.21, J96.22] Atrial fibrillation, unspecified type [facility]) [I48.91] Patient Active Problem List   Diagnosis Date Noted   Respiratory failure ([place]) 05/14/2021  Snoring 03/28/2021   Acute respiratory failure ([place]) 03/19/2021   Chronic left hip pain 03/12/2021   Chronic left shoulder pain 03/12/2021   Mild intermittent asthma without complication 08/31/1550   Lump of axilla, left 03/12/2021   Anxiety with depression 10/06/2017   Healthcare maintenance 10/06/2017   Osteoarthritis, multiple sites 10/06/2017   Alcohol use disorder 07/06/2015   Prepyloric ulcer 06/22/2015   Elevated blood pressure reading with diagnosis of hypertension 04/14/2015    Seasonal allergies 04/14/2015   PCP:  Kerin Perna, NP Pharmacy:   [place], [place] - [address] [place] [address] Phone: [phone] Fax: [phone]     Social Determinants of Health (SDOH) Interventions    Readmission Risk Interventions No flowsheet data found.

## 2021-05-15 NOTE — Procedures (Signed)
Extubation Procedure Note  Patient Details:   Name: [name] [name] DOB: 31-Jul-1960 MRN: [id number]   Airway Documentation:    Vent end date: 05/15/21 Vent end time: 0842   Evaluation  O2 sats: stable throughout Complications: No apparent complications Patient did tolerate procedure well. Bilateral Breath Sounds: Diminished   Yes  RT extubated patient to 4L [place] per MD order with RN at bedside. Positive cuff leak noted. Patient tolerating 4L [place] well at this time. No stridor noted at this time. RN currently bedside. RT will continue to monitor as needed.   Fabiola Backer 05/15/2021, 8:42 AM

## 2021-05-15 NOTE — Progress Notes (Signed)
[facility] ADULT ICU REPLACEMENT PROTOCOL   The patient does apply for the [facility] Adult ICU Electrolyte Replacment Protocol based on the criteria listed below:   1.Exclusion criteria: TCTS patients, ECMO patients, and Dialysis patients 2. Is GFR >/= 30 ml/min? Yes.    Patient's GFR today is >60 3. Is SCr </= 2? Yes.   Patient's SCr is 0.87 mg/dL 4. Did SCr increase >/= 0.5 in 24 hours? No. 5.Pt's weight >40kg  Yes.   6. Abnormal electrolyte(s): phos 1.7, K+ 3.4  7. Electrolytes replaced per protocol 8.  Call MD STAT for K+ </= 2.5, Phos </= 1, or Mag </= 1 Physician:  n/a  [name] [name] 05/15/2021 5:44 AM

## 2021-05-16 ENCOUNTER — Encounter (HOSPITAL_COMMUNITY): Payer: Self-pay | Admitting: Critical Care Medicine

## 2021-05-16 ENCOUNTER — Other Ambulatory Visit (HOSPITAL_COMMUNITY): Payer: Self-pay

## 2021-05-16 ENCOUNTER — Other Ambulatory Visit: Payer: Self-pay

## 2021-05-16 DIAGNOSIS — J9602 Acute respiratory failure with hypercapnia: Secondary | ICD-10-CM | POA: Diagnosis not present

## 2021-05-16 DIAGNOSIS — J9601 Acute respiratory failure with hypoxia: Secondary | ICD-10-CM | POA: Diagnosis not present

## 2021-05-16 LAB — CBC
HCT: 33.4 % — ABNORMAL LOW (ref 36.0–46.0)
Hemoglobin: 10.6 g/dL — ABNORMAL LOW (ref 12.0–15.0)
MCH: 27.3 pg (ref 26.0–34.0)
MCHC: 31.7 g/dL (ref 30.0–36.0)
MCV: 86.1 fL (ref 80.0–100.0)
Platelets: 283 10*3/uL (ref 150–400)
RBC: 3.88 MIL/uL (ref 3.87–5.11)
RDW: 15.8 % — ABNORMAL HIGH (ref 11.5–15.5)
WBC: 15.1 10*3/uL — ABNORMAL HIGH (ref 4.0–10.5)
nRBC: 0 % (ref 0.0–0.2)

## 2021-05-16 LAB — BASIC METABOLIC PANEL
Anion gap: 6 (ref 5–15)
BUN: 12 mg/dL (ref 6–20)
CO2: 33 mmol/L — ABNORMAL HIGH (ref 22–32)
Calcium: 8.7 mg/dL — ABNORMAL LOW (ref 8.9–10.3)
Chloride: 100 mmol/L (ref 98–111)
Creatinine, Ser: 0.59 mg/dL (ref 0.44–1.00)
GFR, Estimated: >60 mL/min (ref >60)
Glucose, Bld: 116 mg/dL — ABNORMAL HIGH (ref 70–99)
Potassium: 4.1 mmol/L (ref 3.5–5.1)
Sodium: 139 mmol/L (ref 135–145)

## 2021-05-16 LAB — RHEUMATOID FACTOR: Rheumatoid fact SerPl-aCnc: 11.4 IU/mL (ref <14.0)

## 2021-05-16 LAB — ANCA TITERS
Atypical P-ANCA titer: <1:20 {titer}
C-ANCA: <1:20 {titer}
P-ANCA: <1:20 {titer}

## 2021-05-16 LAB — GLUCOSE, CAPILLARY
Glucose-Capillary: 108 mg/dL — ABNORMAL HIGH (ref 70–99)
Glucose-Capillary: 101 mg/dL — ABNORMAL HIGH (ref 70–99)

## 2021-05-16 LAB — ANA W/REFLEX IF POSITIVE: Anti Nuclear Antibody (ANA): NEGATIVE

## 2021-05-16 LAB — ANGIOTENSIN CONVERTING ENZYME: Angiotensin-Converting Enzyme: 55 U/L (ref 14–82)

## 2021-05-16 MED ORDER — AMLODIPINE BESYLATE 10 MG PO TABS
10.0000 mg | ORAL_TABLET | Freq: Every day | ORAL | Status: DC
  Administered 2021-05-16: 10 mg via ORAL
  Filled 2021-05-16: qty 1

## 2021-05-16 MED ORDER — GUAIFENESIN ER 600 MG PO TB12
600.0000 mg | ORAL_TABLET | Freq: Two times a day (BID) | ORAL | 0 refills | Status: AC | PRN
  Filled 2021-05-16: qty 6, 3d supply, fill #0

## 2021-05-16 MED ORDER — AMOXICILLIN-POT CLAVULANATE 500-125 MG PO TABS
1.0000 | ORAL_TABLET | Freq: Three times a day (TID) | ORAL | 0 refills | Status: AC
  Filled 2021-05-16: qty 9, 3d supply, fill #0

## 2021-05-16 MED ORDER — FLUOXETINE HCL 20 MG PO CAPS
20.0000 mg | ORAL_CAPSULE | Freq: Every day | ORAL | Status: DC
  Administered 2021-05-16: 20 mg via ORAL
  Filled 2021-05-16: qty 1

## 2021-05-16 MED ORDER — MENTHOL 3 MG MT LOZG
1.0000 | LOZENGE | OROMUCOSAL | Status: DC | PRN
  Administered 2021-05-16: 3 mg via ORAL
  Filled 2021-05-16: qty 9

## 2021-05-16 MED ORDER — FAMOTIDINE 20 MG PO TABS
20.0000 mg | ORAL_TABLET | Freq: Every day | ORAL | Status: DC
  Administered 2021-05-16: 20 mg via ORAL
  Filled 2021-05-16: qty 1

## 2021-05-16 MED ORDER — INSULIN ASPART 100 UNIT/ML IJ SOLN: 0.0000 [IU] | Freq: Three times a day (TID) | INTRAMUSCULAR | Status: DC

## 2021-05-16 MED ORDER — PHENOL 1.4 % MT LIQD
1.0000 | OROMUCOSAL | Status: DC | PRN
  Administered 2021-05-16: 1 via OROMUCOSAL
  Filled 2021-05-16: qty 177

## 2021-05-16 MED ORDER — PREDNISONE 20 MG PO TABS
40.0000 mg | ORAL_TABLET | Freq: Every day | ORAL | 0 refills | Status: AC
  Filled 2021-05-16: qty 6, 3d supply, fill #0

## 2021-05-16 MED ORDER — TRAZODONE HCL 50 MG PO TABS: 50.0000 mg | ORAL_TABLET | Freq: Every day | ORAL | Status: DC

## 2021-05-16 MED ORDER — ORAL CARE MOUTH RINSE
15.0000 mL | Freq: Two times a day (BID) | OROMUCOSAL | Status: DC
  Administered 2021-05-16 (×2): 15 mL via OROMUCOSAL

## 2021-05-16 MED ORDER — AZITHROMYCIN 500 MG PO TABS
500.0000 mg | ORAL_TABLET | Freq: Every day | ORAL | 0 refills | Status: AC
  Filled 2021-05-16: qty 3, 3d supply, fill #0

## 2021-05-16 MED ORDER — FUROSEMIDE 40 MG PO TABS: 40.0000 mg | ORAL_TABLET | Freq: Every day | ORAL | Status: DC

## 2021-05-16 NOTE — TOC Transition Note (Signed)
Transition of Care [facility]) - CM/SW Discharge Note   Patient Details  Name: [name] [name] MRN: [id number] Date of Birth: 11-14-1960  [facility]) CM/SW Contact:  Tom-Johnson, Renea Ee, RN Phone Number: 05/16/2021, 3:22 PM   Clinical Narrative:    Patient is scheduled for discharge home today. No recommendations from PT/OT. CM called [employer] and spoke with Jodell Cipro [phone]). Bedside commode, rolling walker and wheelchair ordered and will be delivered to patient at bedside. Patient will continue to use [employer] for home oxygen. Sister, [name] in room and will transport patient home. No further TC needs noted. Thanks.    Final next level of care: Home/Self Care Barriers to Discharge: No Barriers Identified   Patient Goals and CMS Choice Patient states their goals for this hospitalization and ongoing recovery are:: To to sister's house to recover. CMS Medicare.gov Compare Post Acute Care list provided to:: Patient Choice offered to / list presented to : NA  Discharge Placement                       Discharge Plan and Services   Discharge Planning Services: CM Consult            DME Arranged: N/A DME Agency: NA       HH Arranged: NA HH Agency: NA        Social Determinants of Health (SDOH) Interventions     Readmission Risk Interventions No flowsheet data found.

## 2021-05-16 NOTE — Progress Notes (Signed)
NAME:  [name] [name], MRN:  [id number], DOB:  May 12, 1961, LOS: 2 ADMISSION DATE:  05/14/2021, CONSULTATION DATE: 05/14/2021 REFERRING MD: Emergency department physician, CHIEF COMPLAINT: Acute hypercarbic respiratory failure  History of Present Illness:  60 year old female who is well-known to the pulmonary practice and was found by her son on 05/14/2021 obtunded.  Transferred to [facility] emergency department where her PCO2 level was undetectable therefore she was intubated urgently by the emergency department physician.  She has known asthma with untreated obstructive sleep apnea morbid obesity and a grade 2 diastolic dysfunction by 2D echo in August 2022.  At the time of my examination on 05/14/2021 0730 hrs. she was on full mechanical ventilatory support auto peeping 13 and with copious thin white secretions.  Secondary arterial blood gas showed improvement in her hypercarbic respiratory failure with normalization of her pH and her PCO2 now detectable at 67.  She does have an audible component of wheezing therefore suspected bronchospasm which Solu-Medrol was started.  She is also placed on bronchodilators and given 1 dose of Lasix for apparent heart failure.  She has been started on empirical antimicrobial therapy and has been pancultured.  We will admit to the intensive care unit and continue to follow her.  Pertinent  Medical History   Past Medical History:  Diagnosis Date   Asthma    Bloody stool    Dental decay    Gastritis, Helicobacter pylori 93/73/4287   Hypertension    Pneumonia 06/21/2015   Prepyloric ulcer 06/22/2015   Seasonal allergies    Shortness of breath dyspnea    with exertion     Significant Hospital Events: Including procedures, antibiotic start and stop dates in addition to other pertinent events   05/14/2021 intubation 10/26 extubated   Interim History / Subjective:  No overnight events.  Patient reports feeling close to baseline this morning.  She does not  recall any of the events preceding the hospitalization.  Few days prior she does remember feeling generalized pain.  Was prescribed Tylenol 3 by her PCP.  States that she is on 2 L of supplemental oxygen at home.  Does have follow-up appointment with pulmonology.  Objective   Blood pressure 137/89, pulse 94, temperature 97.7 F (36.5 C), temperature source Oral, resp. rate 19, height _0  (1.6 m), weight 110.2 kg, SpO2 (!) 88 %.    Vent Mode: CPAP;PSV FiO2 (%):  [40 %-50 %] 40 % Set Rate:  [22 bmp] 22 bmp Vt Set:  [420 mL] 420 mL PEEP:  [5 cmH20] 5 cmH20 Pressure Support:  [15 cmH20] 15 cmH20 Plateau Pressure:  [20 cmH20] 20 cmH20   Intake/Output Summary (Last 24 hours) at 05/16/2021 0710 Last data filed at 05/16/2021 0600 Gross per 24 hour  Intake 2119 ml  Output 3150 ml  Net -1031 ml   Filed Weights   05/14/21 0718 05/15/21 0500 05/16/21 0228  Weight: 108.9 kg 110.3 kg 110.2 kg    Examination: General: Morbid obese female, NAD HENT: [employer]/AT, PERRLA Lungs: coarse rhonchi, on nasal cannula  Cardiovascular: RRR, no murmurs Abdomen: Obese soft positive bowel sound Extremities: nonpitting edema lower extremities Neuro: alert, awake and oriented x3 Skin: warm and dry  Labs- BMP within normal limits WBC 15, up from [facility] Problem list     Assessment & Plan:  Acute on chronic hypercarbic respiratory failure in setting of morbid obesity untreated obstructive sleep apnea  Extubated yesterday and doing well, transitioned to 4 L supplemental oxygen, on 2  L at home.  Respiratory panel on blood culture showed no growth to date.  Rheumatologic panel pending.  Will need to consider outpatient CT chest.  Will continue antibiotics for CAP coverage.  We will check pulse ox with ambulation today.  If patient does well may be discharged home versus transfer out of the ICU. -Pulmonary hygiene -Continue bronchodilators and steroids -Continue diuresis - Continue antibiotics for  CAP coverage -Follow respiratory viral panel and blood cultures -Follow-up ANA with reflex  Grade 2 diastolic dysfunction with chronic Lasix use Last echocardiogram 03/19/2021 with LVEF 60 to 65%, no regional wall abnormalities, left ventricular hypertrophy and grade 2 diastolic dysfunction.  Continuing diuresis. -Switch IV Lasix to oral -Strict ins and outs -Maintain K greater than 4, mag greater than 2  Hx of anxiety, alcohol abuse, suspected narcotic use Urine drug screen negative. -Anxiolytics as needed  Electrolyte derangements -Trend BMP and replete as needed  Best Practice (right click and "Reselect all SmartList Selections" daily)   Diet/type: Regular consistency (see orders) DVT prophylaxis: prophylactic heparin  GI prophylaxis: N/A Lines: N/A Foley:  N/A and removal ordered  Code Status:  full code Last date of multidisciplinary goals of care discussion 10/27  Labs   CBC: Recent Labs  Lab 05/14/21 0654 05/15/21 0438  WBC 10.1 11.0*  NEUTROABS 8.9*  --   HGB 11.8* 11.1*  HCT 40.3 33.9*  MCV 92.6 84.1  PLT 277 825    Basic Metabolic Panel: Recent Labs  Lab 05/14/21 0654 05/14/21 2149 05/15/21 0438  NA 139 142 141  K 4.0 3.1* 3.4*  CL 98 103 105  CO2 _0 GLUCOSE 266* 149* 200*  BUN _1 CREATININE 0.64 0.83 0.87  CALCIUM 8.8* 9.1 9.2  MG 1.7 1.4* 2.1  PHOS 6.0*  --  1.7*   GFR: Estimated Creatinine Clearance: 82 mL/min (by C-G formula based on SCr of 0.87 mg/dL). Recent Labs  Lab 05/14/21 0654 05/14/21 0935 05/14/21 1128 05/15/21 0438  PROCALCITON <0.10  --   --   --   WBC 10.1  --   --  11.0*  LATICACIDVEN 1.5 3.1* 2.0*  --     Liver Function Tests: Recent Labs  Lab 05/14/21 0654  AST 17  ALT 18  ALKPHOS 61  BILITOT 1.0  PROT 7.3  ALBUMIN 3.6   Recent Labs  Lab 05/14/21 0654  LIPASE 20  AMYLASE 24*   No results for input(s): AMMONIA in the last 168 hours.  ABG    Component Value Date/Time   PHART 7.510 (H)  05/15/2021 0544   PCO2ART 32.7 05/15/2021 0544   PO2ART 76.4 (L) 05/15/2021 0544   HCO3 25.9 05/15/2021 0544   TCO2 28 01/01/2011 1532   O2SAT 93.9 05/15/2021 0544     Coagulation Profile: Recent Labs  Lab 05/14/21 1128  INR 1.0    Cardiac Enzymes: No results for input(s): CKTOTAL, CKMB, CKMBINDEX, TROPONINI in the last 168 hours.  HbA1C: Hgb A1c MFr Bld  Date/Time Value Ref Range Status  05/15/2021 04:38 AM 6.0 (H) 4.8 - 5.6 % Final    Comment:    (NOTE) Pre diabetes:          5.7%-6.4%  Diabetes:              >6.4%  Glycemic control for   <7.0% adults with diabetes   03/19/2021 05:26 AM 5.8 (H) 4.8 - 5.6 % Final    Comment:    (NOTE)  Prediabetes: 5.7 - 6.4         Diabetes: >6.4         Glycemic control for adults with diabetes: <7.0     CBG: Recent Labs  Lab 05/15/21 0750 05/15/21 1142 05/15/21 1605 05/15/21 1945 05/15/21 2214  GLUCAP 221* 159* 167* 147* 182*    Review of Systems:   Review of Systems  Respiratory:  Positive for cough and sputum production. Negative for shortness of breath.   Cardiovascular:  Negative for chest pain and leg swelling.  Gastrointestinal:  Negative for abdominal pain, nausea and vomiting.    Past Medical History:  She,  has a past medical history of Asthma, Bloody stool, Dental decay, Gastritis, Helicobacter pylori (57/07/7791), Hypertension, Pneumonia (06/21/2015), Prepyloric ulcer (06/22/2015), Seasonal allergies, and Shortness of breath dyspnea.   Surgical History:   Past Surgical History:  Procedure Laterality Date   BARTHOLIN CYST MARSUPIALIZATION Left 1997   CARDIAC SURGERY N/A 1963   Repair of PFO   COLONOSCOPY N/A 2002   Performed secondary to mother's diagnosis of colon CA at age 63   ESOPHAGOGASTRODUODENOSCOPY (EGD) WITH PROPOFOL N/A 06/22/2015   Procedure: ESOPHAGOGASTRODUODENOSCOPY (EGD) WITH PROPOFOL;  Surgeon: Arta Silence, MD;  Location: [facility] ENDOSCOPY;  Service: Endoscopy;  Laterality: N/A;    FRACTURE SURGERY     NASAL SINUS SURGERY Bilateral 1984   OVARIAN CYST REMOVAL Left 1990   TUBAL LIGATION     UMBILICAL HERNIA REPAIR N/A 2005     Social History:   reports that she has never smoked. She has never used smokeless tobacco. She reports current alcohol use. She reports that she does not use drugs.   Family History:  Her family history includes Cerebral aneurysm in her sister; Colon cancer (age of onset: 20) in her mother; Goiter in her sister; Hypertension in her father and mother. There is no history of Breast cancer.   Allergies Allergies  Allergen Reactions   Tetracyclines & Related Itching     Home Medications  Prior to Admission medications   Medication Sig Start Date End Date Taking? Authorizing Provider  albuterol (VENTOLIN HFA) 108 (90 Base) MCG/ACT inhaler INHALE 2 PUFFS INTO THE LUNGS EVERY 6 (SIX) HOURS AS NEEDED FOR WHEEZING OR SHORTNESS OF BREATH. 04/17/20   Kerin Perna, NP  amLODipine (NORVASC) 10 MG tablet TAKE 1 TABLET (10 MG TOTAL) BY MOUTH DAILY. 05/07/21   Dorna Mai, MD  Blood Pressure Monitor KIT 1 kit by Does not apply route 3 (three) times daily as needed. 09/01/19   Kerin Perna, NP  cholecalciferol (VITAMIN D3) 25 MCG (1000 UNIT) tablet Take 1,000 Units by mouth daily.    [provider]  dextromethorphan-guaiFENesin (MUCINEX DM) 30-600 MG 12hr tablet Take 1 tablet by mouth 2 (two) times daily as needed for cough.    [provider]  diclofenac Sodium (VOLTAREN) 1 % GEL APPLY 4 GRAM(S) TOPICALLY TWICE DAILY AS NEEDED Patient taking differently: Apply 4 g topically 2 (two) times daily as needed (pain). 11/27/20   Kerin Perna, NP  FLUoxetine (PROZAC) 20 MG capsule Take 1 capsule (20 mg total) by mouth daily. 03/11/21   Mayers, Cari S, PA-C  fluticasone furoate-vilanterol (BREO ELLIPTA) 200-25 MCG/INH AEPB Inhale 1 puff into the lungs daily. 03/28/21   Martyn Ehrich, NP  folic acid (FOLVITE) 1 MG tablet  Take 1 tablet (1 mg total) by mouth daily. 03/26/21   Oswald Hillock, MD  furosemide (LASIX) 40 MG tablet Take 1 tablet (  40 mg total) by mouth daily. 03/25/21 03/25/22  Oswald Hillock, MD  hydrOXYzine (ATARAX/VISTARIL) 25 MG tablet TAKE 1 TABLET (25 MG TOTAL) BY MOUTH EVERY 8 (EIGHT) HOURS AS NEEDED. Patient taking differently: Take 25 mg by mouth every 8 (eight) hours as needed for anxiety. 11/05/20   Kerin Perna, NP  loratadine (CLARITIN) 10 MG tablet Take 1 tablet (10 mg total) by mouth daily. Patient taking differently: Take 10 mg by mouth daily as needed for allergies. 12/14/19   Kerin Perna, NP  potassium chloride SA (KLOR-CON) 20 MEQ tablet Take 1 tablet (20 mEq total) by mouth daily. Take along with lasix 03/25/21   Oswald Hillock, MD  thiamine 100 MG tablet Take 1 tablet (100 mg total) by mouth daily. 03/26/21   Oswald Hillock, MD  traZODone (DESYREL) 50 MG tablet Take 1 tablet (50 mg total) by mouth at bedtime. 03/11/21   Mayers, Cari S, PA-C    Nusaybah Ivie, DO PGY-3 IMTS

## 2021-05-16 NOTE — Evaluation (Signed)
Occupational Therapy Evaluation Patient Details Name: [name] [name] MRN: [id number] DOB: 1961/07/18 Today's Date: 05/16/2021   History of Present Illness 60 year old female who was found by her son on 10/25 obtunded.  She was transferred to [facility] emergency department where her PCO2 level was undetectabletherefore she was intubated urgently by the emergency department physician.  Extubated yesterday and doing well, transitioned to 2 L at home.   Clinical Impression   Patient admitted for the above diagnosis.  PTA she lives in a one level apartment with her son.  Generally she is Mod I with mobility at [facility] level, and needs no assist with ADL and IADL.  Declines to activity tolerance noted, OT provided education on energy conservation at home.  Patient verbalizes understanding.  Currently she is very close to her baseline for mobility and ADL completion.  She is requesting a RW for home, [facility] PT for establishment of a home exercise program, and OT recommended a 3n1 BSC for night time use.  No further needs in the acute setting, and she is preparing to discharge home to her sister's home for a week initially.       Recommendations for follow up therapy are one component of a multi-disciplinary discharge planning process, led by the attending physician.  Recommendations may be updated based on patient status, additional functional criteria and insurance authorization.   Follow Up Recommendations  Other (comment) ([place] [facility] for HEP)    Assistance Recommended at Discharge PRN  Functional Status Assessment  Patient has had a recent decline in their functional status and demonstrates the ability to make significant improvements in function in a reasonable and predictable amount of time.  Equipment Recommendations  BSC;Other (comment) (2WRW)    Recommendations for Other Services       Precautions / Restrictions Precautions Precautions: Fall Precaution Comments: O2 sats Restrictions Weight Bearing  Restrictions: No      Mobility Bed Mobility               General bed mobility comments: Up in recliner Patient Response: Cooperative  Transfers Overall transfer level: Modified independent Equipment used: Rolling walker (2 wheels)               General transfer comment: OT asissted with O2 tank      Balance Overall balance assessment: Mild deficits observed, not formally tested                                         ADL either performed or assessed with clinical judgement   ADL Overall ADL's : At baseline                                       General ADL Comments: Recommends shower seat, 3n1 for night time, RW and HH PT     Vision Patient Visual Report: No change from baseline       Perception Perception Perception: Not tested   Praxis Praxis Praxis: Not tested    Pertinent Vitals/Pain Pain Assessment: No/denies pain     Hand Dominance Right   Extremity/Trunk Assessment Upper Extremity Assessment Upper Extremity Assessment: Overall WFL for tasks assessed   Lower Extremity Assessment Lower Extremity Assessment: Overall WFL for tasks assessed   Cervical / Trunk Assessment Cervical / Trunk Assessment: Other exceptions Cervical /  Trunk Exceptions: Body habitus   Communication Communication Communication: No difficulties   Cognition Arousal/Alertness: Awake/alert Behavior During Therapy: WFL for tasks assessed/performed Overall Cognitive Status: Within Functional Limits for tasks assessed                                                        Home Living Family/patient expects to be discharged to:: Private residence Living Arrangements: Children Available Help at Discharge: Family;Available 24 hours/day Type of Home: Apartment Home Access: Stairs to enter Entrance Stairs-Number of Steps: 2 Entrance Stairs-Rails: Right Home Layout: One level     Bathroom Shower/Tub: Arts administrator: Standard     Home Equipment: Cane - single point   Additional Comments: Initially going to her sister's home, and then transitioning home once she feels comfortable.  Son is at home, but he recently broke his ankle, and is awaiting surgery.      Prior Functioning/Environment Prior Level of Function : Independent/Modified Independent             Mobility Comments: SPC and O2.  Does not drive. ADLs Comments: Independent with ADL/IADL.        OT Problem List: Decreased activity tolerance      OT Treatment/Interventions:      OT Goals(Current goals can be found in the care plan section) Acute Rehab OT Goals Patient Stated Goal: Return home OT Goal Formulation: With patient Time For Goal Achievement: 05/30/21 Potential to Achieve Goals: Good  OT Frequency:     Barriers to D/C:  None noted          Co-evaluation              AM-PAC OT "6 Clicks" Daily Activity     Outcome Measure Help from another person eating meals?: None Help from another person taking care of personal grooming?: None Help from another person toileting, which includes using toliet, bedpan, or urinal?: None Help from another person bathing (including washing, rinsing, drying)?: None Help from another person to put on and taking off regular upper body clothing?: None Help from another person to put on and taking off regular lower body clothing?: None 6 Click Score: 24   End of Session Equipment Utilized During Treatment: Rolling walker (2 wheels);Oxygen  Activity Tolerance: Patient tolerated treatment well Patient left: in chair;with call bell/phone within reach  OT Visit Diagnosis: Muscle weakness (generalized) (M62.81);Unsteadiness on feet (R26.81)                Time: 1341-1405 OT Time Calculation (min): 24 min Charges:  OT General Charges $OT Visit: 1 Visit OT Evaluation $OT Eval Moderate Complexity: 1 Mod OT Treatments $Self Care/Home Management : 8-22  mins  05/16/2021  RP, OTR/L  Acute Rehabilitation Services  Office:  [phone]   Metta Clines 05/16/2021, 2:15 PM

## 2021-05-16 NOTE — Progress Notes (Signed)
Reevaluated patient late morning after she ambulated around the unit.  States that she feels near her baseline and ready to go home.  States that she has a walking cane at home feels that she would benefit from a walker.  As she was ambulating noted unsteady gait which required her to use a walker.  Oxygenation saturations remained around 90 to 93%.  Will have PT evaluate her prior to discharge and DME order for a walker.  Plan to discharge home later today.

## 2021-05-16 NOTE — Progress Notes (Signed)
Pt discharge home to sister Toula Moos. Pt V/S/S. Equipment at bedside that is  needed for discharge home. Discharge instructions provided to pt and pt's sister [name].

## 2021-05-16 NOTE — Discharge Instructions (Signed)
Please continue taking the following medications for an additional 3 days starting on 10/28- Prednisone 40 mg daily  Azithromycin 500 mg daily Augmentin 500-125 mg 3 times daily  Guaifenesin as needed for phlegm   Take your home medications as prescribed Follow-up with your PCP in 1 week Follow-up with pulmonology on 11/3

## 2021-05-16 NOTE — Discharge Summary (Signed)
Physician Discharge Summary         Patient ID: [name] [name] MRN: [id number] DOB/AGE: October 27, 1960 60 y.o.  Admit date: 05/14/2021 Discharge date: 05/16/2021  Discharge Diagnoses:   Acute on chronic hypercarbic respiratory failure in the setting of morbid obesity and untreated obstructive sleep HFpEF    Discharge summary   60 year old female who was found by her son on 10/25 obtunded.  She was transferred to [facility] emergency department where her PCO2 level was undetectabletherefore she was intubated urgently by the emergency department physician.  She has known asthma with untreated obstructive sleep apnea morbid obesity and a grade 2 diastolic dysfunction by 2D echo in August 2022.  Her blood gas improved with mechanical ventilation.  She had audible wheezing and was suspected to have bronchospasm and Solu-Medrol was started.  She was also placed on bronchodilators and IV Lasix for apparent heart failure.  Chest radiograph showed right upper lobe pneumonia and she was started on antibiotics for this.  Culture showed no growth to date.  She was extubated the following day on 10/26.  Transition to supplemental oxygen, back on 2 L which is her baseline.  Discharged on antibiotics for CAP coverage and steroid for total of 5 days.  Of note her previous CT was concerning for ILD, rheumatologic work-up ordered and pending.  May need outpatient imaging.   Discharge Plan by Active Problems    Acute on chronic hypercarbic respiratory failure in setting of morbid obesity untreated obstructive sleep apnea  Extubated yesterday and doing well, transitioned to 4 L supplemental oxygen, on 2 L at home.  Respiratory panel on blood culture showed no growth to date.  Rheumatologic panel pending.  Will need to consider outpatient CT chest.  Will continue antibiotics for CAP coverage.  Pulse oxygenation 90-93% while ambulating.  Pending PT evaluation we will discharge patient home.  Continuing antibiotics for  CAP coverage and steroids for 5 days.  Will need to follow-up rheumatologic panel.   Grade 2 diastolic dysfunction with chronic Lasix use Last echocardiogram 03/19/2021 with LVEF 60 to 65%, no regional wall abnormalities, left ventricular hypertrophy and grade 2 diastolic dysfunction.  Continuing home Lasix.   Hx of anxiety, alcohol abuse, suspected narcotic use Urine drug screen negative.  Resumed home medications    Significant Hospital tests/ studies  CT Head Wo Contrast  Result Date: 05/14/2021 CLINICAL DATA:  Found unresponsive, mental status change, hypertension EXAM: CT HEAD WITHOUT CONTRAST TECHNIQUE: Contiguous axial images were obtained from the base of the skull through the vertex without intravenous contrast. COMPARISON:  12/19/2008 FINDINGS: Brain: Mild progression of patchy white matter hypoattenuation compatible with chronic microvascular ischemic change. No acute intracranial hemorrhage, new mass lesion, definite new infarction, midline shift, herniation, hydrocephalus, or extra-axial fluid collection. No focal mass effect or edema. Cisterns are patent. Cerebellar abnormality. Vascular: No hyperdense vessel or unexpected calcification. Skull: Normal. Negative for fracture or focal lesion. Sinuses/Orbits: Orbits are unremarkable and symmetric. Chronic sinus disease with maxillary wall hypertrophy and mucosal thickening. Other: None. IMPRESSION: Mild white matter microvascular changes. No acute intracranial abnormality by noncontrast CT. Electronically Signed   By: Jerilynn Mages.  Shick M.D.   On: 05/14/2021 10:26   DG Chest Portable 1 View  Result Date: 05/14/2021 CLINICAL DATA:  60 year old female with history of hypoxia. Unresponsive patient. EXAM: PORTABLE CHEST 1 VIEW COMPARISON:  Chest x-ray 03/19/2021. FINDINGS: An endotracheal tube is in place with tip 4.4 cm above the carina. Nasogastric tube extending into the dust stool esophagus,  but tube appears coiled back upon itself with tip in  the proximal esophagus. Airspace consolidation in the right upper lobe concerning for pneumonia. There is cephalization of the pulmonary vasculature and slight indistinctness of the interstitial markings suggestive of mild pulmonary edema. No definite pleural effusions. Mild cardiomegaly. The patient is rotated to the right on today's exam, resulting in distortion of the mediastinal contours and reduced diagnostic sensitivity and specificity for mediastinal pathology. IMPRESSION: 1. Support apparatus, as above. Please take note of the malpositioned nasogastric tube which is coiled in the esophagus with tip in the proximal esophagus. 2. Right upper lobe airspace consolidation concerning for pneumonia. Followup PA and lateral chest X-ray is recommended in 3-4 weeks following trial of antibiotic therapy to ensure resolution and exclude underlying malignancy. 3. There also is evidence of probable congestive heart failure, as above. Electronically Signed   By: Vinnie Langton M.D.   On: 05/14/2021 07:31   DG Abd Portable 1V  Result Date: 05/14/2021 CLINICAL DATA:  OG tube placement EXAM: PORTABLE ABDOMEN - 1 VIEW COMPARISON:  None. FINDINGS: Esophagogastric tube with tip and side port below the diaphragm, tip in the vicinity of the pylorus. Nonobstructive pattern of included bowel gas. IMPRESSION: Esophagogastric tube with tip and side port below the diaphragm, tip in the vicinity of the pylorus. Electronically Signed   By: Delanna Ahmadi M.D.   On: 05/14/2021 14:12    Procedures   Intubated 10/25 and extubated 10/26 Culture data/antimicrobials   Blood Culture    Component Value Date/Time   SDES BLOOD RIGHT ANTECUBITAL 05/14/2021 0900   SPECREQUEST  05/14/2021 0900    BOTTLES DRAWN AEROBIC AND ANAEROBIC Blood Culture results may not be optimal due to an inadequate volume of blood received in culture bottles   CULT  05/14/2021 0900    NO GROWTH 2 DAYS Performed at [facility], [place] [address],  [place], [place] [address]    REPTSTATUS PENDING 05/14/2021 0900      Consults    None  Discharge Exam: BP (!) 146/97   Pulse 94   Temp 98.5 F (36.9 C) (Oral)   Resp 18   Ht 5' 3" (1.6 m)   Wt 110.2 kg   SpO2 93%   BMI 43.04 kg/m   General: Morbid obese female, NAD HENT: /AT, PERRLA Lungs: coarse rhonchi, on nasal cannula  Cardiovascular: RRR, no murmurs Abdomen: Obese soft positive bowel sound Extremities: nonpitting edema lower extremities Neuro: alert, awake and oriented x3 Skin: warm and dry  Labs at discharge   Lab Results  Component Value Date   CREATININE 0.59 05/16/2021   BUN 12 05/16/2021   NA 139 05/16/2021   K 4.1 05/16/2021   CL 100 05/16/2021   CO2 33 (H) 05/16/2021   Lab Results  Component Value Date   WBC 15.1 (H) 05/16/2021   HGB 10.6 (L) 05/16/2021   HCT 33.4 (L) 05/16/2021   MCV 86.1 05/16/2021   PLT 283 05/16/2021   Lab Results  Component Value Date   ALT 18 05/14/2021   AST 17 05/14/2021   ALKPHOS 61 05/14/2021   BILITOT 1.0 05/14/2021   Lab Results  Component Value Date   INR 1.0 05/14/2021   INR 0.95 03/13/2010   INR 1.0 04/02/2009    Current radiological studies    No results found.  Disposition:   Discharge home Discharge Instructions     Diet - low sodium heart healthy   Complete by: As directed  Increase activity slowly   Complete by: As directed        Allergies as of 05/16/2021       Reactions   Tetracyclines & Related Itching        Medication List     TAKE these medications    albuterol 108 (90 Base) MCG/ACT inhaler Commonly known as: VENTOLIN HFA INHALE 2 PUFFS INTO THE LUNGS EVERY 6 (SIX) HOURS AS NEEDED FOR WHEEZING OR SHORTNESS OF BREATH.   amLODipine 10 MG tablet Commonly known as: NORVASC TAKE 1 TABLET (10 MG TOTAL) BY MOUTH DAILY.   amoxicillin-clavulanate 500-125 MG tablet Commonly known as: AUGMENTIN Take 1 tablet (500 mg total) by mouth 3 (three) times daily for 3  days. Start taking on: May 17, 2021   azithromycin 500 MG tablet Commonly known as: ZITHROMAX Take 1 tablet (500 mg total) by mouth daily for 3 days. Start taking on: May 17, 2021   Blood Pressure Monitor Kit 1 kit by Does not apply route 3 (three) times daily as needed.   budesonide-formoterol 160-4.5 MCG/ACT inhaler Commonly known as: SYMBICORT Inhale 2 puffs into the lungs 2 (two) times daily.   cholecalciferol 25 MCG (1000 UNIT) tablet Commonly known as: VITAMIN D3 Take 1,000 Units by mouth daily.   dextromethorphan-guaiFENesin 30-600 MG 12hr tablet Commonly known as: MUCINEX DM Take 1 tablet by mouth 2 (two) times daily as needed for cough.   diclofenac Sodium 1 % Gel Commonly known as: VOLTAREN APPLY 4 GRAM(S) TOPICALLY TWICE DAILY AS NEEDED What changed:  how much to take how to take this when to take this reasons to take this additional instructions   FLUoxetine 20 MG capsule Commonly known as: PROZAC Take 1 capsule (20 mg total) by mouth daily.   fluticasone furoate-vilanterol 200-25 MCG/INH Aepb Commonly known as: Breo Ellipta Inhale 1 puff into the lungs daily.   folic acid 1 MG tablet Commonly known as: FOLVITE Take 1 tablet (1 mg total) by mouth daily.   furosemide 40 MG tablet Commonly known as: Lasix Take 1 tablet (40 mg total) by mouth daily.   guaiFENesin 600 MG 12 hr tablet Commonly known as: MUCINEX Take 1 tablet (600 mg total) by mouth 2 (two) times daily as needed for up to 3 days for to loosen phlegm or cough.   hydrOXYzine 25 MG tablet Commonly known as: ATARAX/VISTARIL TAKE 1 TABLET (25 MG TOTAL) BY MOUTH EVERY 8 (EIGHT) HOURS AS NEEDED. What changed: reasons to take this   loratadine 10 MG tablet Commonly known as: CLARITIN Take 1 tablet (10 mg total) by mouth daily. What changed:  when to take this reasons to take this   potassium chloride SA 20 MEQ tablet Commonly known as: KLOR-CON Take 1 tablet (20 mEq total) by  mouth daily. Take along with lasix   predniSONE 20 MG tablet Commonly known as: DELTASONE Take 2 tablets (40 mg total) by mouth daily with breakfast for 3 days. Start taking on: May 17, 2021   thiamine 100 MG tablet Take 1 tablet (100 mg total) by mouth daily.   traZODone 50 MG tablet Commonly known as: DESYREL Take 1 tablet (50 mg total) by mouth at bedtime.               Durable Medical Equipment  (From admission, onward)           Start     Ordered   05/16/21 1045  For home use only DME Walker  Once  Question:  Patient needs a walker to treat with the following condition  Answer:  Hypoxia   05/16/21 1045             Follow-up appointment    Follow-up with PCP in 1 week Appointment with lumbar pulmonology Dr. Loanne Drilling on 05/23/2021 Discharge Condition:    stable   Signed: Areeg N Rehman 05/16/2021, 1:58 PM

## 2021-05-16 NOTE — Progress Notes (Signed)
PT Cancellation Note  Patient Details Name: [name] [name] MRN: [id number] DOB: 09/25/1960   Cancelled Treatment:    Reason Eval/Treat Not Completed: PT screened, no needs identified, will sign off.  Per OT, pt is a her baseline.  No PT needs.  Will sign off. 05/16/2021  Ginger Carne., PT Acute Rehabilitation Services [phone]  (pager) [phone]  (office)   [name] 05/16/2021, 2:55 PM

## 2021-05-17 ENCOUNTER — Telehealth: Payer: Self-pay

## 2021-05-17 NOTE — Telephone Encounter (Signed)
Transition Care Management Unsuccessful Follow-up Telephone Call  Date of discharge and from where:  [facility] 05/16/2021  Attempts:  1st Attempt  Reason for unsuccessful TCM follow-up call: Unable to reach message left VM to call back .  Pt need to schedule HFU appt with PCP

## 2021-05-19 ENCOUNTER — Encounter (HOSPITAL_BASED_OUTPATIENT_CLINIC_OR_DEPARTMENT_OTHER): Payer: Medicare Other | Admitting: Pulmonary Disease

## 2021-05-19 LAB — CULTURE, BLOOD (ROUTINE X 2)
Culture: NO GROWTH
Culture: NO GROWTH

## 2021-05-20 ENCOUNTER — Other Ambulatory Visit: Payer: Self-pay | Admitting: Physician Assistant

## 2021-05-20 ENCOUNTER — Other Ambulatory Visit: Payer: Self-pay | Admitting: Primary Care

## 2021-05-20 ENCOUNTER — Telehealth: Payer: Self-pay

## 2021-05-20 ENCOUNTER — Other Ambulatory Visit (INDEPENDENT_AMBULATORY_CARE_PROVIDER_SITE_OTHER): Payer: Self-pay | Admitting: Primary Care

## 2021-05-20 DIAGNOSIS — M25562 Pain in left knee: Secondary | ICD-10-CM

## 2021-05-20 DIAGNOSIS — F418 Other specified anxiety disorders: Secondary | ICD-10-CM

## 2021-05-20 DIAGNOSIS — G8929 Other chronic pain: Secondary | ICD-10-CM

## 2021-05-20 DIAGNOSIS — F5101 Primary insomnia: Secondary | ICD-10-CM

## 2021-05-20 NOTE — Telephone Encounter (Signed)
Sent to PCP ?

## 2021-05-20 NOTE — Telephone Encounter (Signed)
Transition Care Management Follow-up Telephone Call Date of discharge and from where: 05/16/2021, [facility] How have you been since you were released from the hospital? She said she is feeling better Any questions or concerns? Yes- she thought she had a sleep study scheduled for 05/23/2021. Explained to her that is a pulmonary appointment and PFT.  She said that she was told she needs a COVID test today prior to that appointment but she just got out of the hospital and said it would be difficult for her to get there today. Instructed her to call the pulmonary office and clarify if the test is still needed.   Items Reviewed: Did the pt receive and understand the discharge instructions provided? Yes  Medications obtained and verified?  She does not have all of her medications yet, waiting on a delivery from [employer] today. She is requesting a refill of trazodone.  Other? No  Any new allergies since your discharge? No  Do you have support at home?  Lives alone, difficult to rely on others to assist   Home Care and Equipment/Supplies: Were home health services ordered? no If so, what is the name of the agency? N/a  Has the agency set up a time to come to the patient's home? not applicable Were any new equipment or medical supplies ordered?  Yes: wheelchair , rollator What is the name of the medical supply agency? [employer]  Were you able to get the supplies/equipment? Yes, but it is too big for her home.  Do you have any questions related to the use of the equipment or supplies? No  Already has O2 from [place]. Using it at 2L continuously.   Functional Questionnaire: (I = Independent and D = Dependent) ADLs: independent. Using rollator with ambulation  Requesting PCS referral for assistance with personal care, meal preparation.   Follow up appointments reviewed:  PCP Hospital f/u appt confirmed? Yes  Scheduled to see Juluis Mire, NP on 05/22/2021  @ 1430.  Requesting  virtual visit. [facility] f/u appt confirmed? Yes  Scheduled to see pulmonary  on 05/23/2021.  Are transportation arrangements needed? No  If their condition worsens, is the pt aware to call PCP or go to the Emergency Dept.? Yes Was the patient provided with contact information for the PCP's office or ED? Yes Was to pt encouraged to call back with questions or concerns? Yes

## 2021-05-20 NOTE — Telephone Encounter (Signed)
Transition Care Management Unsuccessful Follow-up Telephone Call  Date of discharge and from where:  05/16/2021, [facility]   Attempts:  2nd Attempt  Reason for unsuccessful TCM follow-up call:  Left voice message on # [phone].    Need to discuss scheduling hospital follow up appointment with PCP

## 2021-05-20 NOTE — Telephone Encounter (Signed)
noted 

## 2021-05-22 ENCOUNTER — Telehealth (INDEPENDENT_AMBULATORY_CARE_PROVIDER_SITE_OTHER): Payer: Medicare Other | Admitting: Primary Care

## 2021-05-23 ENCOUNTER — Ambulatory Visit: Payer: Medicare Other | Admitting: Pulmonary Disease

## 2021-06-28 ENCOUNTER — Ambulatory Visit: Payer: Medicare Other | Admitting: Pulmonary Disease

## 2021-06-28 ENCOUNTER — Other Ambulatory Visit (INDEPENDENT_AMBULATORY_CARE_PROVIDER_SITE_OTHER): Payer: Self-pay | Admitting: Primary Care

## 2021-06-28 ENCOUNTER — Ambulatory Visit (INDEPENDENT_AMBULATORY_CARE_PROVIDER_SITE_OTHER): Payer: Self-pay

## 2021-06-28 DIAGNOSIS — J4541 Moderate persistent asthma with (acute) exacerbation: Secondary | ICD-10-CM

## 2021-06-28 NOTE — Telephone Encounter (Signed)
Sent to PCP ?

## 2021-06-28 NOTE — Telephone Encounter (Signed)
The patient has called to share that they're experiencing sinus congestion, a dry cough as well as a sore throat   The patient was feeling discomfort yesterday but woke up this morning 06/28/21 feeling worse   The patient would like to be prescribed something to help with their symptoms   The patient has ordered Coricidin and using vaporub     Please contact further when possible    Chief Complaint: Sinus congestion, cough, sore throat Symptoms: Same Frequency: Started yesterday Pertinent Negatives: Patient denies Fever Disposition: [] ED /[x] Urgent Care (no appt availability in office) / [] Appointment(In office/virtual)/ []  [place] [facility]/ [] Home Care/ [x] Refused Recommended Disposition  Additional Notes: Asking for visit or medications to be called in. Please advise pt.  Answer Assessment - Initial Assessment Questions 1. LOCATION: "Where does it hurt?"      Sinus congestion 2. ONSET: "When did the sinus pain start?"  (e.g., hours, days)      Yesterday 3. SEVERITY: "How bad is the pain?"   (Scale 1-10; mild, moderate or severe)   - MILD (1-3): doesn't interfere with normal activities    - MODERATE (4-7): interferes with normal activities (e.g., work or school) or awakens from sleep   - SEVERE (8-10): excruciating pain and patient unable to do any normal activities        Mild 4. RECURRENT SYMPTOM: "Have you ever had sinus problems before?" If Yes, ask: "When was the last time?" and "What happened that time?"      Yes 5. NASAL CONGESTION: "Is the nose blocked?" If Yes, ask: "Can you open it or must you breathe through your mouth?"     Runny nose 6. NASAL DISCHARGE: "Do you have discharge from your nose?" If so ask, "What color?"     Unsure 7. FEVER: "Do you have a fever?" If Yes, ask: "What is it, how was it measured, and when did it start?"      No 8. OTHER SYMPTOMS: "Do you have any other symptoms?" (e.g., sore throat, cough, earache, difficulty breathing)     Sore  throat , cough 9. PREGNANCY: "Is there any chance you are pregnant?" "When was your last menstrual period?"     No  Protocols used: Sinus Pain or Congestion-A-AH

## 2021-07-29 ENCOUNTER — Telehealth (INDEPENDENT_AMBULATORY_CARE_PROVIDER_SITE_OTHER): Payer: Self-pay | Admitting: Primary Care

## 2021-07-29 ENCOUNTER — Other Ambulatory Visit: Payer: Self-pay | Admitting: Family Medicine

## 2021-07-29 DIAGNOSIS — I10 Essential (primary) hypertension: Secondary | ICD-10-CM

## 2021-07-29 NOTE — Telephone Encounter (Signed)
Copied from [place] [id number]. Topic: Quick Communication - Rx Refill/Question >> Jul 29, 2021  2:05 PM Tessa Lerner A wrote: Medication: amLODipine (NORVASC) 10 MG tablet [[id number]] - patient has zero tablets   Has the patient contacted their pharmacy? Yes.  The patient has been directed to contact their PCP (Agent: If no, request that the patient contact the pharmacy for the refill. If patient does not wish to contact the pharmacy document the reason why and proceed with request.) (Agent: If yes, when and what did the pharmacy advise?)  Preferred Pharmacy (with phone number or street name): [place], [place] - [address] [place] [address] Phone: [phone] Fax: [phone]  Has the patient been seen for an appointment in the last year OR does the patient have an upcoming appointment? Yes.    Agent: Please be advised that RX refills may take up to 3 business days. We ask that you follow-up with your pharmacy.

## 2021-07-30 NOTE — Telephone Encounter (Signed)
Requested medication (s) are due for refill today:   Yes  Requested medication (s) are on the active medication list:   Yes  Future visit scheduled:   No   Last ordered: 05/07/2021 #30, 0 refills by Dr. Dorna Mai on the [facility].    Returned because prescribed by another provider from [employer].     Requested Prescriptions  Pending Prescriptions Disp Refills   amLODipine (NORVASC) 10 MG tablet 30 tablet 0    Sig: Take 1 tablet (10 mg total) by mouth daily.     Cardiovascular:  Calcium Channel Blockers Failed - 07/29/2021  3:23 PM      Failed - Last BP in normal range    BP Readings from Last 1 Encounters:  05/16/21 (!) 146/97          Failed - Valid encounter within last 6 months    Recent Outpatient Visits           10 months ago Chronic pain of both knees   [facility] Kerin Perna, NP   1 year ago Anxiety with depression   [place], Charlane Ferretti, MD   1 year ago Moderate persistent asthma with acute exacerbation   [facility] Kerin Perna, NP   1 year ago Essential hypertension   [place], Michelle P, NP   2 years ago Special screening for malignant neoplasms, colon   [place], [place], NP       Future Appointments             In 2 weeks Margaretha Seeds, MD [facility]

## 2021-07-30 NOTE — Telephone Encounter (Signed)
Sent to PCP ?

## 2021-07-31 NOTE — Telephone Encounter (Signed)
[place] called for update on Rx refill request for amLODipine (NORVASC) 10 MG tablet

## 2021-08-08 ENCOUNTER — Other Ambulatory Visit (INDEPENDENT_AMBULATORY_CARE_PROVIDER_SITE_OTHER): Payer: Self-pay | Admitting: Primary Care

## 2021-08-08 ENCOUNTER — Other Ambulatory Visit: Payer: Self-pay | Admitting: Family Medicine

## 2021-08-08 DIAGNOSIS — I10 Essential (primary) hypertension: Secondary | ICD-10-CM

## 2021-08-08 NOTE — Telephone Encounter (Signed)
Requested medication (s) are due for refill today: see note  Requested medication (s) are on the active medication list: yes  Last refill:  05/07/21 #30 0 refills  Future visit scheduled: no  Notes to clinic:  do you want to refill Rx? Last ordered by Dorna Mai , MD - [facility]. Do you want to schedule future visit with [name] or [facility]?     Requested Prescriptions  Pending Prescriptions Disp Refills   amLODipine (NORVASC) 10 MG tablet [Pharmacy Med Name: AMLODIPINE BESYLATE 10 MG ORAL TABLET] 30 tablet 0    Sig: TAKE 1 TABLET (10 MG TOTAL) BY MOUTH DAILY.     Cardiovascular:  Calcium Channel Blockers Failed - 08/08/2021  9:43 AM      Failed - Last BP in normal range    BP Readings from Last 1 Encounters:  05/16/21 (!) 146/97          Failed - Valid encounter within last 6 months    Recent Outpatient Visits           11 months ago Chronic pain of both knees   [facility] Kerin Perna, NP   1 year ago Anxiety with depression   [place], Charlane Ferretti, MD   1 year ago Moderate persistent asthma with acute exacerbation   [facility] Kerin Perna, NP   1 year ago Essential hypertension   [place], Michelle P, NP   2 years ago Special screening for malignant neoplasms, colon   [place] Kerin Perna, NP       Future Appointments             In 1 week Margaretha Seeds, MD [facility]

## 2021-08-08 NOTE — Telephone Encounter (Signed)
Patient states she was unaware she needed appointment to receive her amLODipine (NORVASC) 10 MG tablet. Patient states pharmacy did not inform her an appointment was needed until she called her pharmacy today to check on the status. Patient states she is out of her BP medication and would like PCP to send in a short supply to hold her over until her 08/22/2021 appointment with PCP. Patient states she would like a follow up call from her PCP or nurse to advise her if the short supply will be sent in to   [place], [place] Phone:  [phone]  Fax:  [phone]

## 2021-08-08 NOTE — Telephone Encounter (Signed)
Per PCP patient will need to present to February appointment for additional refills. She was given a 30 day supply in October and left without being seen in November. Patient is aware of this. She is upset but states she will just have to wait.

## 2021-08-16 ENCOUNTER — Ambulatory Visit: Payer: Medicare Other | Admitting: Pulmonary Disease

## 2021-08-22 ENCOUNTER — Encounter (INDEPENDENT_AMBULATORY_CARE_PROVIDER_SITE_OTHER): Payer: Self-pay | Admitting: Primary Care

## 2021-08-22 ENCOUNTER — Other Ambulatory Visit: Payer: Self-pay

## 2021-08-22 ENCOUNTER — Ambulatory Visit (INDEPENDENT_AMBULATORY_CARE_PROVIDER_SITE_OTHER): Payer: Medicare Other | Admitting: Primary Care

## 2021-08-22 VITALS — BP 152/92 | HR 94 | Temp 97.4°F | Ht 63.0 in | Wt 243.2 lb

## 2021-08-22 DIAGNOSIS — Z1211 Encounter for screening for malignant neoplasm of colon: Secondary | ICD-10-CM | POA: Diagnosis not present

## 2021-08-22 DIAGNOSIS — Z760 Encounter for issue of repeat prescription: Secondary | ICD-10-CM

## 2021-08-22 DIAGNOSIS — I10 Essential (primary) hypertension: Secondary | ICD-10-CM | POA: Diagnosis not present

## 2021-08-22 DIAGNOSIS — D638 Anemia in other chronic diseases classified elsewhere: Secondary | ICD-10-CM | POA: Diagnosis not present

## 2021-08-22 DIAGNOSIS — Z13220 Encounter for screening for lipoid disorders: Secondary | ICD-10-CM

## 2021-08-22 MED ORDER — HYDROCHLOROTHIAZIDE 25 MG PO TABS: 25.0000 mg | ORAL_TABLET | Freq: Every day | ORAL | 3 refills | Status: DC

## 2021-08-22 MED ORDER — VALSARTAN 40 MG PO TABS: 40.0000 mg | ORAL_TABLET | Freq: Every day | ORAL | 1 refills | Status: DC

## 2021-08-22 MED ORDER — AMLODIPINE BESYLATE 10 MG PO TABS: 10.0000 mg | ORAL_TABLET | Freq: Every day | ORAL | 1 refills | Status: DC

## 2021-08-22 NOTE — Patient Instructions (Signed)

## 2021-08-22 NOTE — Progress Notes (Signed)
[name] [name] is a 61 y.o. female presents for hypertension evaluation, Denies shortness of breath, headaches, chest pain or lower extremity edema, sudden onset, vision changes, unilateral weakness, dizziness, paresthesias   Patient reports adherence with medications.  Dietary habits include: sodium diet  Exercise habits include: yes Family / Social history: no   Past Medical History:  Diagnosis Date   Asthma    Bloody stool    Dental decay    Gastritis, Helicobacter pylori 35/32/9924   Hypertension    Pneumonia 06/21/2015   Prepyloric ulcer 06/22/2015   Seasonal allergies    Shortness of breath dyspnea    with exertion   Past Surgical History:  Procedure Laterality Date   BARTHOLIN CYST MARSUPIALIZATION Left [place]   Repair of PFO   COLONOSCOPY N/A 2002   Performed secondary to mother's diagnosis of colon CA at age 15   ESOPHAGOGASTRODUODENOSCOPY (EGD) WITH PROPOFOL N/A 06/22/2015   Procedure: ESOPHAGOGASTRODUODENOSCOPY (EGD) WITH PROPOFOL;  Surgeon: Arta Silence, MD;  Location: [facility];  Service: Endoscopy;  Laterality: N/A;   FRACTURE SURGERY     NASAL SINUS SURGERY Bilateral 1984   OVARIAN CYST REMOVAL Left 1990   TUBAL LIGATION     UMBILICAL HERNIA REPAIR N/A 2005   Allergies  Allergen Reactions   Tetracyclines & Related Itching   Current Outpatient Medications on File Prior to Visit  Medication Sig Dispense Refill   albuterol (VENTOLIN HFA) 108 (90 Base) MCG/ACT inhaler INHALE 2 PUFFS INTO THE LUNGS EVERY 6 (SIX) HOURS AS NEEDED FOR WHEEZING OR SHORTNESS OF BREATH. 8.5 g 1   Blood Pressure Monitor KIT 1 kit by Does not apply route 3 (three) times daily as needed. 1 kit 0   budesonide-formoterol (SYMBICORT) 160-4.5 MCG/ACT inhaler Inhale 2 puffs into the lungs 2 (two) times daily.     cholecalciferol (VITAMIN D3) 25 MCG (1000 UNIT) tablet Take 1,000 Units by mouth daily.     diclofenac Sodium  (VOLTAREN) 1 % GEL APPLY 4 GRAM(S) TOPICALLY TWICE DAILY AS NEEDED 400 g 0   FLUoxetine (PROZAC) 20 MG capsule Take 1 capsule (20 mg total) by mouth daily. 30 capsule 1   fluticasone furoate-vilanterol (BREO ELLIPTA) 200-25 MCG/INH AEPB Inhale 1 puff into the lungs daily. 60 each 11   furosemide (LASIX) 40 MG tablet Take 1 tablet (40 mg total) by mouth daily. 30 tablet 11   hydrOXYzine (ATARAX/VISTARIL) 25 MG tablet TAKE 1 TABLET (25 MG TOTAL) BY MOUTH EVERY 8 (EIGHT) HOURS AS NEEDED. 90 tablet 2   loratadine (CLARITIN) 10 MG tablet Take 1 tablet (10 mg total) by mouth daily. (Patient taking differently: Take 10 mg by mouth daily as needed for allergies.) 30 tablet 11   potassium chloride SA (KLOR-CON) 20 MEQ tablet Take 1 tablet (20 mEq total) by mouth daily. Take along with lasix 30 tablet 2   thiamine 100 MG tablet Take 1 tablet (100 mg total) by mouth daily. 30 tablet 0   traZODone (DESYREL) 50 MG tablet Take 1 tablet (50 mg total) by mouth at bedtime. 30 tablet 1   No current facility-administered medications on file prior to visit.   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  Tobacco Use   Smoking status: Never   Smokeless tobacco: Never  Substance and Sexual Activity   Alcohol use: Yes    Comment: 48 oz malt liquor when drinks, drinks most days.  Difficult to pin down; last drink was 03/13/21.   Drug use: No   Sexual activity: Yes  Other Topics Concern   Not on file  Social History Narrative   Born in [place], but grew up in [place]   Was homeless Sept. 2016, when initially established   Now has own apartment, lives alone   Brother and 2 sons live in town, improving relationships.    States ended up homeless due to difficulty with living situation when children moved to [place] to live with her--she ended up leaving to get away from them and did not have a job.     Not ready to discuss details   Social Determinants of Health   Financial Resource Strain: Not on file  Food Insecurity: Not on file  Transportation Needs: Not on file  Physical Activity: Not on file  Stress: Not on file  Social Connections: Not on file  Intimate Partner Violence: Not on file   Family History  Problem Relation Age of Onset   Colon cancer Mother 6   Hypertension Mother    Hypertension Father    Cerebral aneurysm Sister    Goiter Sister    Breast cancer Neg Hx      OBJECTIVE: BP (!) 152/92 (BP Location: Right Arm, Patient Position: Sitting, Cuff Size: Large)    Pulse 94    Temp (!) 97.4 F (36.3 C) (Oral)    Ht '5\' 3"'  (1.6 m)    Wt 243 lb 3.2 oz (110.3 kg)    SpO2 93%    BMI 43.08 kg/m     Physical exam: General: Vital signs reviewed.  Patient is well-developed and well-nourished, morbid obesity in no acute distress and cooperative with exam. Head: Normocephalic and atraumatic. Eyes: EOMI, conjunctivae normal, no scleral icterus. Neck: Supple, trachea midline, normal ROM, no JVD, masses, thyromegaly, or carotid bruit present. Cardiovascular: RRR, S1 normal, S2 normal, no murmurs, gallops, or rubs. Pulmonary/Chest: Clear to auscultation bilaterally, no wheezes, rales, or rhonchi. Abdominal: Soft, non-tender, non-distended, BS +, no masses, organomegaly, or guarding present. Musculoskeletal: No joint deformities, erythema, or stiffness, ROM full and nontender. Extremities: No lower extremity edema bilaterally,  pulses symmetric and intact bilaterally. No cyanosis or clubbing. Neurological: A&O x3, Strength is normal Skin: Warm, dry and intact. No rashes or erythema. Psychiatric: Normal mood and affect. speech and behavior is normal. Cognition and memory are normal.      ROS Comprehensive ROS Pertinent positive and negative noted in HPI   Last 3 Office BP readings: BP Readings from Last 3 Encounters:  08/22/21 (!) 152/92  05/16/21 (!) 146/97  03/28/21 128/74    BMET    Component Value Date/Time   NA 139 05/16/2021 0657   NA CANCELED 03/12/2021 0901   K 4.1 05/16/2021 0657   CL 100 05/16/2021 0657   CO2 33 (H) 05/16/2021 0657   GLUCOSE 116 (H) 05/16/2021 0657   BUN 12 05/16/2021 0657   BUN CANCELED 03/12/2021 0901   CREATININE 0.59 05/16/2021 0657   CALCIUM 8.7 (L) 05/16/2021 0657   GFRNONAA >60 05/16/2021 0657   GFRAA >60 10/28/2019 1227    Renal function: CrCl cannot be calculated (Patient's most recent lab result is older than  the maximum 21 days allowed.).  Clinical ASCVD: No  The 10-year ASCVD risk score (Arnett DK, et al., 2019) is: 9.8%   Values used to calculate the score:     Age: 28 years     Sex: Female     Is Non-Hispanic African American: Yes     Diabetic: No     Tobacco smoker: No     Systolic Blood Pressure: 383 mmHg     Is BP treated: Yes     HDL Cholesterol: 58 mg/dL     Total Cholesterol: 181 mg/dL  ASCVD risk factors include- [place]   ASSESSMENT & PLAN:  Carina was seen today for hypertension and medication refill.  Diagnoses and all orders for this visit:  Colon cancer screening -     Cologuard  Medication refill     amLODipine (NORVASC) 10 MG tablet; Take 1 tablet (10 mg total) by mouth daily. -     valsartan (DIOVAN) 40 MG tablet; Take 1 tablet (40 mg total) by mouth daily. -     hydrochlorothiazide (HYDRODIURIL) 25 MG tablet; Take 1 tablet (25 mg total) by mouth daily.  Anemia of chronic disease -     CBC with Differential  Lipid screening -     Lipid Panel    Essential hypertension -Counseled on lifestyle modifications for blood pressure control including reduced dietary sodium, increased exercise, weight reduction and adequate sleep. Also, educated patient about the risk for cardiovascular  events, stroke and heart attack. Also counseled patient about the importance of medication adherence. If you participate in smoking, it is important to stop using tobacco as this will increase the risks associated with uncontrolled blood pressure.  -Hypertension longstanding diagnosed currently amLODipine (NORVASC) 10 MG tablet; Take 1 tablet (10 mg total) by mouth daily, valsartan (DIOVAN) 40 MG tablet; Take 1 tablet (40 mg total) by mouth daily. hydrochlorothiazide (HYDRODIURIL) 25 MG tablet; Take 1 tablet (25 mg total) by mouth daily.on current medications. Patient is adherent with current medications.   Goal BP:  For patients younger than 60: Goal BP < 130/80. For patients 60 and older: Goal BP < 140/90. For patients with diabetes: Goal BP < 130/80. Your most recent BP: 152/92  Minimize salt intake. Minimize alcohol intake    This note has been created with Surveyor, quantity. Any transcriptional errors are unintentional.   Kerin Perna, NP 08/25/2021, 10:03 PM

## 2021-08-23 ENCOUNTER — Encounter (INDEPENDENT_AMBULATORY_CARE_PROVIDER_SITE_OTHER): Payer: Self-pay | Admitting: Primary Care

## 2021-08-23 LAB — LIPID PANEL
Chol/HDL Ratio: 3.1 ratio (ref 0.0–4.4)
Cholesterol, Total: 181 mg/dL (ref 100–199)
HDL: 58 mg/dL (ref >39)
LDL Chol Calc (NIH): 102 mg/dL — ABNORMAL HIGH (ref 0–99)
Triglycerides: 118 mg/dL (ref 0–149)
VLDL Cholesterol Cal: 21 mg/dL (ref 5–40)

## 2021-08-23 LAB — CBC WITH DIFFERENTIAL/PLATELET
Basophils Absolute: 0 10*3/uL (ref 0.0–0.2)
Basos: 1 %
EOS (ABSOLUTE): 0.1 10*3/uL (ref 0.0–0.4)
Eos: 1 %
Hematocrit: 39.8 % (ref 34.0–46.6)
Hemoglobin: 13.1 g/dL (ref 11.1–15.9)
Immature Grans (Abs): 0 10*3/uL (ref 0.0–0.1)
Immature Granulocytes: 1 %
Lymphocytes Absolute: 2.1 10*3/uL (ref 0.7–3.1)
Lymphs: 25 %
MCH: 28.7 pg (ref 26.6–33.0)
MCHC: 32.9 g/dL (ref 31.5–35.7)
MCV: 87 fL (ref 79–97)
Monocytes Absolute: 0.7 10*3/uL (ref 0.1–0.9)
Monocytes: 8 %
Neutrophils Absolute: 5.5 10*3/uL (ref 1.4–7.0)
Neutrophils: 64 %
Platelets: 285 10*3/uL (ref 150–450)
RBC: 4.56 x10E6/uL (ref 3.77–5.28)
RDW: 14.9 % (ref 11.7–15.4)
WBC: 8.4 10*3/uL (ref 3.4–10.8)

## 2021-08-29 ENCOUNTER — Other Ambulatory Visit (INDEPENDENT_AMBULATORY_CARE_PROVIDER_SITE_OTHER): Payer: Self-pay | Admitting: Primary Care

## 2021-08-29 ENCOUNTER — Ambulatory Visit (INDEPENDENT_AMBULATORY_CARE_PROVIDER_SITE_OTHER): Payer: Self-pay | Admitting: *Deleted

## 2021-08-29 DIAGNOSIS — G8929 Other chronic pain: Secondary | ICD-10-CM

## 2021-08-29 MED ORDER — DICLOFENAC SODIUM 1 % EX GEL: CUTANEOUS | 0 refills | Status: DC

## 2021-08-29 NOTE — Telephone Encounter (Signed)
The patient is experiencing significant discomfort in their left knee   The patient has a history of previous procedures and discomfort with this knee   The patient shares that it feels like something is moving in their knee   Please contact further when possible   The patient would like to be prescribed something for their discomfort      Chief Complaint: Knee pain Symptoms: Left knee pain, ongoing "For years" worsening since Monday Frequency: Onset Monday Pertinent Negatives: Patient denies redness, warmth, no injury  Disposition: [] ED /[] Urgent Care (no appt availability in office) / [] Appointment(In office/virtual)/ []  [facility]/ [] Home Care/ [] Refused Recommended Disposition /[] [facility]/ []  Follow-up with PCP Additional Notes: HAs tried muntiple OTC meds and ointments. Pt saw Sharyn Lull 08/22/21. Requesting pain med for knee. Advised would need appt, wishes to see if anything could be called in since just seen. ALso Stage manager saw today and advised Orthopedic referral.Pt would like Sharyn Lull to know nurse stated her BP was down and "Very good." Please advise.  Reason for Disposition  [1] MODERATE pain (e.g., interferes with normal activities, limping) AND [2] present > 3 days  Answer Assessment - Initial Assessment Questions 1. LOCATION and RADIATION: "Where is the pain located?"      Left knee 2. QUALITY: "What does the pain feel like?"  (e.g., sharp, dull, aching, burning)     Varies 3. SEVERITY: "How bad is the pain?" "What does it keep you from doing?"   (Scale 1-10; or mild, moderate, severe)   -  MILD (1-3): doesn't interfere with normal activities    -  MODERATE (4-7): interferes with normal activities (e.g., work or school) or awakens from sleep, limping    -  SEVERE (8-10): excruciating pain, unable to do any normal activities, unable to walk     7/10 4. ONSET: "When did the pain start?" "Does it come and go, or is it there all the  time?"     Onset Tuesday 5. RECURRENT: "Have you had this pain before?" If Yes, ask: "When, and what happened then?"     Yes, off and on for years 6. SETTING: "Has there been any recent work, exercise or other activity that involved that part of the body?"      No 7. AGGRAVATING FACTORS: "What makes the knee pain worse?" (e.g., walking, climbing stairs, running)     Walking, wearing brace 8. ASSOCIATED SYMPTOMS: "Is there any swelling or redness of the knee?"     Swelling from surgery years ago 9. OTHER SYMPTOMS: "Do you have any other symptoms?" (e.g., chest pain, difficulty breathing, fever, calf pain)     No  Protocols used: Knee Pain-A-AH

## 2021-08-29 NOTE — Telephone Encounter (Signed)
Please see encounter note. Patient wants something for knee discomfort.

## 2021-08-30 ENCOUNTER — Other Ambulatory Visit (INDEPENDENT_AMBULATORY_CARE_PROVIDER_SITE_OTHER): Payer: Self-pay | Admitting: Primary Care

## 2021-08-30 DIAGNOSIS — J4541 Moderate persistent asthma with (acute) exacerbation: Secondary | ICD-10-CM

## 2021-09-01 ENCOUNTER — Encounter (INDEPENDENT_AMBULATORY_CARE_PROVIDER_SITE_OTHER): Payer: Self-pay | Admitting: Primary Care

## 2021-09-03 ENCOUNTER — Telehealth (INDEPENDENT_AMBULATORY_CARE_PROVIDER_SITE_OTHER): Payer: Self-pay | Admitting: Primary Care

## 2021-09-03 ENCOUNTER — Other Ambulatory Visit (INDEPENDENT_AMBULATORY_CARE_PROVIDER_SITE_OTHER): Payer: Self-pay | Admitting: Primary Care

## 2021-09-03 DIAGNOSIS — M25562 Pain in left knee: Secondary | ICD-10-CM

## 2021-09-03 NOTE — Telephone Encounter (Signed)
Spoke with [name] said she is having pain in her left knee and request a referral be sent to a PT. She said her pain level is 6-7 most days. Advised PCP will send referral and PT will contact her to make appt.

## 2021-09-16 ENCOUNTER — Telehealth: Payer: Self-pay

## 2021-09-16 ENCOUNTER — Ambulatory Visit: Payer: Medicare Other | Attending: Primary Care

## 2021-09-16 NOTE — Telephone Encounter (Signed)
TC and spoke with patient regarding missed visit, she already rescheduled her appointment for 09/20/21

## 2021-09-16 NOTE — Therapy (Unsigned)
OUTPATIENT PHYSICAL THERAPY LOWER EXTREMITY EVALUATION   Patient Name: [name] [name] MRN: [id number] DOB:[birth year], 61 y.o., female Today's Date: 09/16/2021    Past Medical History:  Diagnosis Date   Asthma    Bloody stool    Dental decay    Gastritis, Helicobacter pylori 69/62/9528   Hypertension    Pneumonia 06/21/2015   Prepyloric ulcer 06/22/2015   Seasonal allergies    Shortness of breath dyspnea    with exertion   Past Surgical History:  Procedure Laterality Date   BARTHOLIN CYST MARSUPIALIZATION Left [place]   Repair of PFO   COLONOSCOPY N/A 2002   Performed secondary to mother's diagnosis of colon CA at age 57   ESOPHAGOGASTRODUODENOSCOPY (EGD) WITH PROPOFOL N/A 06/22/2015   Procedure: ESOPHAGOGASTRODUODENOSCOPY (EGD) WITH PROPOFOL;  Surgeon: Arta Silence, MD;  Location: [facility];  Service: Endoscopy;  Laterality: N/A;   FRACTURE SURGERY     NASAL SINUS SURGERY Bilateral 1984   OVARIAN CYST REMOVAL Left 1990   TUBAL LIGATION     UMBILICAL HERNIA REPAIR N/A 2005   Patient Active Problem List   Diagnosis Date Noted   Respiratory failure ([place]) 05/14/2021   Snoring 03/28/2021   Acute respiratory failure ([place]) 03/19/2021   Chronic left hip pain 03/12/2021   Chronic left shoulder pain 03/12/2021   Mild intermittent asthma without complication 41/32/4401   Lump of axilla, left 03/12/2021   Anxiety with depression 10/06/2017   Healthcare maintenance 10/06/2017   Osteoarthritis, multiple sites 10/06/2017   Alcohol use disorder 07/06/2015   Prepyloric ulcer 06/22/2015   Elevated blood pressure reading with diagnosis of hypertension 04/14/2015   Seasonal allergies 04/14/2015    PCP: Kerin Perna, NP  REFERRING PROVIDER: Kerin Perna, NP  REFERRING DIAG: [phone] (ICD-10-CM) - Left knee pain, unspecified chronicity   THERAPY DIAG: Left knee pain, unspecified chronicity    ONSET DATE: chronic in nature  SUBJECTIVE:    SUBJECTIVE STATEMENT: ***  PERTINENT HISTORY: ***  PAIN:  Are you having pain? {yes/no:20286} NPRS scale: ***/10 Pain location: *** Pain orientation: {Pain Orientation:25161}  PAIN TYPE: {type:313116} Pain description: {PAIN DESCRIPTION:21022940}  Aggravating factors: *** Relieving factors: ***  PRECAUTIONS: None  WEIGHT BEARING RESTRICTIONS No  FALLS:  Has patient fallen in last 6 months? {yes/no:20286}, Number of falls: ***  LIVING ENVIRONMENT: Lives with: {[facility] lives with:25569::"lives with their family"} Lives in: {Lives in:25570} Stairs: {yes/no:20286}; {Stairs:24000} Has following equipment at home: {Assistive devices:23999}  OCCUPATION: retired  PLOF: {PLOF:24004}  PATIENT GOALS To manage my knee pain and get    OBJECTIVE:   DIAGNOSTIC FINDINGS: Generalized osteopenia. Moderate-severe medial femorotibial compartment joint space narrowing with marginal osteophytes. Mild lateral femorotibial compartment joint space narrowing with bulky marginal osteophytes. Patellofemoral compartment moderate joint space narrowing with marginal osteophytes. Small joint effusion. Healed lateral tibial plateau fracture transfixed with a lateral sideplate and multiple interlocking screws without hardware failure or complication.   IMPRESSION: 1. Tricompartmental osteoarthritis of the left knee most severe in medial femorotibial compartment.  PATIENT SURVEYS:  FOTO ***  COGNITION:  Overall cognitive status: Within functional limits for tasks assessed     SENSATION:  Light touch: Appears intact   MUSCLE LENGTH: Hamstrings: Right *** deg; Left *** deg Thomas test: Right *** deg; Left *** deg  POSTURE:  ***  PALPATION: ***  LE AROM/PROM:  A/PROM Right 09/16/2021 Left 09/16/2021  Hip flexion    Hip extension    Hip abduction    Hip adduction  Hip internal rotation    Hip external rotation    Knee flexion    Knee extension    Ankle dorsiflexion     Ankle plantarflexion    Ankle inversion    Ankle eversion     (Blank rows = not tested)  LE MMT:  MMT Right 09/16/2021 Left 09/16/2021  Hip flexion    Hip extension    Hip abduction    Hip adduction    Hip internal rotation    Hip external rotation    Knee flexion    Knee extension    Ankle dorsiflexion    Ankle plantarflexion    Ankle inversion    Ankle eversion     (Blank rows = not tested)  LOWER EXTREMITY SPECIAL TESTS:  {LEspecialtests:26242}  FUNCTIONAL TESTS:  {Functional tests:24029}  GAIT: Distance walked: *** Assistive device utilized: {Assistive devices:23999} Level of assistance: {Levels of assistance:24026} Comments: ***    TODAY'S TREATMENT: ***   PATIENT EDUCATION:  Education details: *** Person educated: {Person educated:25204} Education method: {Education Method:25205} Education comprehension: {Education Comprehension:25206}   HOME EXERCISE PROGRAM: ***  ASSESSMENT:  CLINICAL IMPRESSION: Patient is a 61 y.o. female who was seen today for physical therapy evaluation and treatment for ***.    OBJECTIVE IMPAIRMENTS {opptimpairments:25111}.   ACTIVITY LIMITATIONS {activity limitations:25113}.   PERSONAL FACTORS {Personal factors:25162} are also affecting patient's functional outcome.    REHAB POTENTIAL: Good  CLINICAL DECISION MAKING: Stable/uncomplicated  EVALUATION COMPLEXITY: Low   GOALS: Goals reviewed with patient? Yes  SHORT TERM GOALS:  STG Name Target Date Goal status  1 Patient to demonstrate independence in HEP  Baseline:  {follow up:25551} {GOALSTATUS:25110}  2 *** Baseline:  {follow up:25551} {GOALSTATUS:25110}  3 *** Baseline: {follow up:25551} {GOALSTATUS:25110}  4 *** Baseline: {follow up:25551} {GOALSTATUS:25110}  5 *** Baseline: {follow up:25551} {GOALSTATUS:25110}  6 *** Baseline: {follow up:25551} {GOALSTATUS:25110}  7 *** Baseline: {follow up:25551} {GOALSTATUS:25110}   LONG TERM GOALS:    LTG Name Target Date Goal status  1 *** Baseline: {follow up:25551} {GOALSTATUS:25110}  2 *** Baseline: {follow up:25551} {GOALSTATUS:25110}  3 *** Baseline: {follow up:25551} {GOALSTATUS:25110}  4 *** Baseline: {follow up:25551} {GOALSTATUS:25110}  5 *** Baseline: {follow up:25551} {GOALSTATUS:25110}  6 *** Baseline: {follow up:25551} {GOALSTATUS:25110}  7 *** Baseline: {follow up:25551} {GOALSTATUS:25110}   PLAN: PT FREQUENCY: {rehab frequency:25116}  PT DURATION: {rehab duration:25117}  PLANNED INTERVENTIONS: {rehab planned interventions:25118::"Therapeutic exercises","Therapeutic activity","Neuro Muscular re-education","Balance training","Gait training","Patient/Family education","Joint mobilization"}  PLAN FOR NEXT SESSION: ***   Lanice Shirts, PT 09/16/2021, 8:18 AM

## 2021-09-16 NOTE — Therapy (Deleted)
OUTPATIENT PHYSICAL THERAPY THORACOLUMBAR EVALUATION   Patient Name: [name] [name] MRN: [id number] DOB:1960-10-03, 61 y.o., female Today's Date: 09/16/2021    Past Medical History:  Diagnosis Date   Asthma    Bloody stool    Dental decay    Gastritis, Helicobacter pylori 65/46/5035   Hypertension    Pneumonia 06/21/2015   Prepyloric ulcer 06/22/2015   Seasonal allergies    Shortness of breath dyspnea    with exertion   Past Surgical History:  Procedure Laterality Date   BARTHOLIN CYST MARSUPIALIZATION Left [place]   Repair of PFO   COLONOSCOPY N/A 2002   Performed secondary to mother's diagnosis of colon CA at age 52   ESOPHAGOGASTRODUODENOSCOPY (EGD) WITH PROPOFOL N/A 06/22/2015   Procedure: ESOPHAGOGASTRODUODENOSCOPY (EGD) WITH PROPOFOL;  Surgeon: Arta Silence, MD;  Location: [facility];  Service: Endoscopy;  Laterality: N/A;   FRACTURE SURGERY     NASAL SINUS SURGERY Bilateral 1984   OVARIAN CYST REMOVAL Left 1990   TUBAL LIGATION     UMBILICAL HERNIA REPAIR N/A 2005   Patient Active Problem List   Diagnosis Date Noted   Respiratory failure ([place]) 05/14/2021   Snoring 03/28/2021   Acute respiratory failure ([place]) 03/19/2021   Chronic left hip pain 03/12/2021   Chronic left shoulder pain 03/12/2021   Mild intermittent asthma without complication 46/56/8127   Lump of axilla, left 03/12/2021   Anxiety with depression 10/06/2017   Healthcare maintenance 10/06/2017   Osteoarthritis, multiple sites 10/06/2017   Alcohol use disorder 07/06/2015   Prepyloric ulcer 06/22/2015   Elevated blood pressure reading with diagnosis of hypertension 04/14/2015   Seasonal allergies 04/14/2015    PCP: Kerin Perna, NP  REFERRING PROVIDER: Kerin Perna, NP  REFERRING DIAG: [phone] (ICD-10-CM) - Left knee pain, unspecified chronicity   THERAPY DIAG:  No diagnosis found.  ONSET DATE: ***  SUBJECTIVE:                                                                                                                                                                                            SUBJECTIVE STATEMENT: *** PERTINENT HISTORY:  ***  PAIN:  Are you having pain? {yes/no:20286} NPRS scale: ***/10 Pain location: *** Pain orientation: {Pain Orientation:25161}  PAIN TYPE: {type:313116} Pain description: {PAIN DESCRIPTION:21022940}  Aggravating factors: *** Relieving factors: ***  PRECAUTIONS: {Therapy precautions:24002}  WEIGHT BEARING RESTRICTIONS {Yes ***/No:24003}  FALLS:  Has patient fallen in last 6 months? {yes/no:20286}, Number of falls: ***  LIVING ENVIRONMENT: Lives with: {[facility] lives with:25569::"lives with their family"} Lives in: {Lives in:25570} Stairs: {yes/no:20286}; {Stairs:24000} Has following  equipment at home: {Assistive devices:23999}  OCCUPATION: ***  PLOF: {PLOF:24004}  PATIENT GOALS ***   OBJECTIVE:   DIAGNOSTIC FINDINGS:  ***  PATIENT SURVEYS:  {rehab surveys:24030}  SCREENING FOR RED FLAGS: Bowel or bladder incontinence: {Yes/No:304960894} Spinal tumors: {Yes/No:304960894} Cauda equina syndrome: {Yes/No:304960894} Compression fracture: {Yes/No:304960894} Abdominal aneurysm: {Yes/No:304960894}  COGNITION:  Overall cognitive status: {cognition:24006}     SENSATION:  Light touch: {intact/deficits:24005}  Stereognosis: {intact/deficits:24005}  Hot/Cold: {intact/deficits:24005}  Proprioception: {intact/deficits:24005}  MUSCLE LENGTH: Hamstrings: Right *** deg; Left *** deg Thomas test: Right *** deg; Left *** deg  POSTURE:  ***  PALPATION: ***  LUMBARAROM/PROM  A/PROM A/PROM  09/16/2021  Flexion   Extension   Right lateral flexion   Left lateral flexion   Right rotation   Left rotation    (Blank rows = not tested)  LE AROM/PROM:  A/PROM Right 09/16/2021 Left 09/16/2021  Hip flexion    Hip extension    Hip abduction    Hip adduction    Hip internal  rotation    Hip external rotation    Knee flexion    Knee extension    Ankle dorsiflexion    Ankle plantarflexion    Ankle inversion    Ankle eversion     (Blank rows = not tested)  LE MMT:  MMT Right 09/16/2021 Left 09/16/2021  Hip flexion    Hip extension    Hip abduction    Hip adduction    Hip internal rotation    Hip external rotation    Knee flexion    Knee extension    Ankle dorsiflexion    Ankle plantarflexion    Ankle inversion    Ankle eversion     (Blank rows = not tested)  LUMBAR SPECIAL TESTS:  {lumbar special test:25242}  FUNCTIONAL TESTS:  {Functional tests:24029}  GAIT: Distance walked: *** Assistive device utilized: {Assistive devices:23999} Level of assistance: {Levels of assistance:24026} Comments: ***    TODAY'S TREATMENT  ***   PATIENT EDUCATION:  Education details: *** Person educated: {Person educated:25204} Education method: {Education Method:25205} Education comprehension: {Education Comprehension:25206}   HOME EXERCISE PROGRAM: ***  ASSESSMENT:  CLINICAL IMPRESSION: Patient is a *** y.o. *** who was seen today for physical therapy evaluation and treatment for ***.    OBJECTIVE IMPAIRMENTS {opptimpairments:25111}.   ACTIVITY LIMITATIONS {activity limitations:25113}.   PERSONAL FACTORS {Personal factors:25162} are also affecting patient's functional outcome.    REHAB POTENTIAL: {rehabpotential:25112}  CLINICAL DECISION MAKING: {clinical decision making:25114}  EVALUATION COMPLEXITY: {Evaluation complexity:25115}   GOALS: Goals reviewed with patient? {yes/no:20286}  SHORT TERM GOALS:  STG Name Target Date Goal status  1 *** Baseline:  {follow up:25551} {GOALSTATUS:25110}  2 *** Baseline:  {follow up:25551} {GOALSTATUS:25110}  3 *** Baseline: {follow up:25551} {GOALSTATUS:25110}  4 *** Baseline: {follow up:25551} {GOALSTATUS:25110}  5 *** Baseline: {follow up:25551} {GOALSTATUS:25110}  6 *** Baseline:  {follow up:25551} {GOALSTATUS:25110}  7 *** Baseline: {follow up:25551} {GOALSTATUS:25110}   LONG TERM GOALS:   LTG Name Target Date Goal status  1 *** Baseline: {follow up:25551} {GOALSTATUS:25110}  2 *** Baseline: {follow up:25551} {GOALSTATUS:25110}  3 *** Baseline: {follow up:25551} {GOALSTATUS:25110}  4 *** Baseline: {follow up:25551} {GOALSTATUS:25110}  5 *** Baseline: {follow up:25551} {GOALSTATUS:25110}  6 *** Baseline: {follow up:25551} {GOALSTATUS:25110}  7 *** Baseline: {follow up:25551} {GOALSTATUS:25110}   PLAN: PT FREQUENCY: {rehab frequency:25116}  PT DURATION: {rehab duration:25117}  PLANNED INTERVENTIONS: {rehab planned interventions:25118::"Therapeutic exercises","Therapeutic activity","Neuro Muscular re-education","Balance training","Gait training","Patient/Family education","Joint mobilization"}  PLAN FOR NEXT SESSION: ***   Lanice Shirts, PT 09/16/2021,  7:46 AM

## 2021-09-19 NOTE — Therapy (Signed)
OUTPATIENT PHYSICAL THERAPY LOWER EXTREMITY EVALUATION   Patient Name: [name] [name] MRN: [id number] DOB:[birth year], 61 y.o., female Today's Date: 09/20/2021   PT End of Session - 09/20/21 1020     Visit Number 1    Number of Visits 17    Date for PT Re-Evaluation 11/15/21    Authorization Type [employer] MCR    Authorization Time Period FOTO v6, v10, KX mod v15    Progress Note Due on Visit 10    PT Start Time 1000    PT Stop Time 1040    PT Time Calculation (min) 40 min    Activity Tolerance Patient tolerated treatment well    Behavior During Therapy WFL for tasks assessed/performed             Past Medical History:  Diagnosis Date   Asthma    Bloody stool    Dental decay    Gastritis, Helicobacter pylori 31/49/7026   Hypertension    Pneumonia 06/21/2015   Prepyloric ulcer 06/22/2015   Seasonal allergies    Shortness of breath dyspnea    with exertion   Past Surgical History:  Procedure Laterality Date   BARTHOLIN CYST MARSUPIALIZATION Left [place]   Repair of PFO   COLONOSCOPY N/A 2002   Performed secondary to mother's diagnosis of colon CA at age 3   ESOPHAGOGASTRODUODENOSCOPY (EGD) WITH PROPOFOL N/A 06/22/2015   Procedure: ESOPHAGOGASTRODUODENOSCOPY (EGD) WITH PROPOFOL;  Surgeon: Arta Silence, MD;  Location: [facility] ENDOSCOPY;  Service: Endoscopy;  Laterality: N/A;   FRACTURE SURGERY     NASAL SINUS SURGERY Bilateral 1984   OVARIAN CYST REMOVAL Left 1990   TUBAL LIGATION     UMBILICAL HERNIA REPAIR N/A 2005   Patient Active Problem List   Diagnosis Date Noted   Respiratory failure ([place]) 05/14/2021   Snoring 03/28/2021   Acute respiratory failure ([place]) 03/19/2021   Chronic left hip pain 03/12/2021   Chronic left shoulder pain 03/12/2021   Mild intermittent asthma without complication 37/85/8850   Lump of axilla, left 03/12/2021   Anxiety with depression 10/06/2017   Healthcare maintenance 10/06/2017   Osteoarthritis, multiple sites  10/06/2017   Alcohol use disorder 07/06/2015   Prepyloric ulcer 06/22/2015   Elevated blood pressure reading with diagnosis of hypertension 04/14/2015   Seasonal allergies 04/14/2015    PCP: Kerin Perna, NP  REFERRING PROVIDER: Kerin Perna, NP  REFERRING DIAG: [phone] (ICD-10-CM) - Left knee pain, unspecified chronicity  THERAPY DIAG:  Chronic pain of left knee  Difficulty in walking, not elsewhere classified  Muscle weakness (generalized)  ONSET DATE: 2005  SUBJECTIVE:   SUBJECTIVE STATEMENT: Pt reports primary complaint of Lt knee pain and stiffness which has progressively gotten worse since 2005 when she fell and broke her tibia. This was followed by ORIF surgery to repair her fx. She reports that her doctors told her at that point that she would likely develop arthritis in her leg later in life. She reports that her knee pain has gotten worse since 2017, leading her to apply for disability in 2018. Pt reports morning stiffness lasting about 30 minutes which is improved with movement. She also reports swelling, which is worse after being on her feet for prolonged periods. Additionally, she reports crunchy grinding with knee movement. Pt's current pain is 4/10. Worst pain is 10/10. Best pain is 4/10. Aggravating factors include prolonged standing >15 minutes, prolonged walking >15 minutes, sitting >15-20 minutes. Easing factors include Voltaren gel, elevation,  rest, and wearing her soft knee brace.  Pt denies any catching, locking, clicking, or popping. Pt denies unexplained weight change, nausea/ vomiting, N/T, saddle anesthesia, or unrelenting night pain.  PERTINENT HISTORY: Lt tibia fx and ORIF in 2005  PAIN:  Are you having pain? Yes NPRS scale: 4/10 Pain location: Lt anterior knee PAIN TYPE: aching, dull, and sharp Pain description: constant  Aggravating factors: prolonged standing >15 minutes, prolonged walking >15 minutes, sitting >15-20 minutes Relieving  factors: Voltaren gel, elevation, rest, and wearing her soft knee brace  PRECAUTIONS: None  WEIGHT BEARING RESTRICTIONS No  FALLS:  Has patient fallen in last 6 months? No, Number of falls: 0  LIVING ENVIRONMENT: Lives with: lives with their family Lives in: House/apartment Stairs: Yes; External: 2 steps; none Has following equipment at home: Single point cane and Walker - 4 wheeled  OCCUPATION: On disability  PLOF: [place] shopping, walking for exercise   OBJECTIVE:   DIAGNOSTIC FINDINGS: 07/30/2018: Left Knee DG: IMPRESSION: 1. Tricompartmental osteoarthritis of the left knee most severe in medial femorotibial compartment.  PATIENT SURVEYS:  FOTO 33%, predicted 54% in 15 visits  COGNITION:  Overall cognitive status: Within functional limits for tasks assessed     SENSATION:  Light touch: Appears intact  MUSCLE LENGTH: Hamstrings:  Thomas test:   POSTURE:  Forward posture, offloading Lt LE  PALPATION: TTP to medial/ lateral knee compartment, swelling noted about the patella  PASSIVE ACCESSORIES: Hypomobile Lt patellar mobilization in all planes  LE AROM:  AROM Right 09/20/2021 Left 09/20/2021  Knee flexion 120 116  Knee extension 0 0   (Blank rows = not tested)  LE MMT:  MMT Right 09/20/2021 Left 09/20/2021  Hip flexion    Hip extension    Hip abduction    Knee flexion 5/5 5/5  Knee extension 5/5 4+/5p!   (Blank rows = not tested)  LOWER EXTREMITY SPECIAL TESTS:  Knee special tests: Patellafemoral apprehension test: positive , Lateral pull sign: negative, Patellafemoral grind test: positive , and Step up/down test: positive  Hoffa's fat pad test: neg, Brush test (+)  FUNCTIONAL TESTS:  5 times sit to stand: 3 sit-to-stand in 12 seconds, unable to continue due to fatigue 6 minute walk test: Deferred due to SOB related to pt's asthma  Squat: Unable to assess    TODAY'S TREATMENT: 09/20/2021: Issued and demonstrated  HEP   PATIENT EDUCATION:  Education details: Pt educated on probable underlying pathophysiology behind her pain presentation, POC, prognosis, FOTO, and HEP Person educated: Patient Education method: Consulting civil engineer, Demonstration, and Handouts Education comprehension: verbalized understanding and returned demonstration   HOME EXERCISE PROGRAM:   ASSESSMENT:  CLINICAL IMPRESSION: Patient is a 61 y.o. F who was seen today for physical therapy evaluation and treatment for chronic Lt knee pain. Upon assessment, her primary impairments include weak and painful Lt knee extension MMT, Lt knee global swelling, poor endurance, and hypomobile global patellar passive accessories on Lt. Ruling up Lt knee osteoarthritis due to report of morning stiffness, global knee swelling, crepitus with knee flexion/ extension, and previous imaging suggestive of tricompartmental OA. Ruling down meniscal/ ligamentous involvement due to no report of popping, clicking, catching, or locking. About 25 minutes into the eval, the pt reports SOB related to her asthma after about 10 feet of walking for a 6MWT. She had left her inhaler at home, although her son offered to let her use his. She declined, and the rest of the assessment was limited to performing activities  in sitting. Her breathing returned to baseline and she leaves clinic with her son with no sxs of distress. She reports that she will bring her inhaler to her next visit. Further knee and hip assessment is warranted at her first treatment. In addition to her HEP, the pt was provided a walking program to begin to tolerance to address probable arthritic pain and endurance. She will benefit from skilled PT to address her primary impairments and return to her prior level of function with less limitation.   OBJECTIVE IMPAIRMENTS Abnormal gait, decreased activity tolerance, decreased balance, decreased endurance, decreased mobility, difficulty walking, decreased ROM, decreased  strength, hypomobility, impaired flexibility, improper body mechanics, postural dysfunction, and pain.   ACTIVITY LIMITATIONS cleaning, community activity, occupation, Medical sales representative, yard work, and shopping.   PERSONAL FACTORS Age and 1-2 comorbidities: asthma, HTN  are also affecting patient's functional outcome.    REHAB POTENTIAL: Fair Due to chronicity of sxs and decreased endurance  CLINICAL DECISION MAKING: Evolving/moderate complexity  EVALUATION COMPLEXITY: Moderate   GOALS: Goals reviewed with patient? No  SHORT TERM GOALS:  Pt will report understanding and adherence to her HEP in order to promote independence in the management of her primary impairments. Baseline: HEP provided at eval Target date: 10/18/2021 Goal status: INITIAL    LONG TERM GOALS:  Pt will achieve Lt knee extension MMT of 5/5 with 0-3/10 pain in order to progress her independent LE strengthening regimen with less limitation. Baseline: 4+/5 with 8/10 pain Target date: 11/15/2021 Goal status: INITIAL  2.  Pt will achieve a FOTO score of 54% in order to demonstrate improved functional ability as it relates to her knee pain. Baseline: 33% Target date: 11/15/2021 Goal status: INITIAL  3.  Pt will report ability to stand 30 minutes in order to return to washing dishes and cooking with less limitation. Baseline: Unable to stand >15 minutes due to knee pain Target date: 11/15/2021 Goal status: INITIAL  4.  Pt will achieve a 6MWT with gait speed of ___ in order to demonstrate safe community ambulation for grocery shopping. Baseline: Not performed due to asthma exacerbation; assess as able at first treatment session. Target date: 11/15/2021 Goal status: INITIAL  5.  Pt will achieve 5xSTS in 12 seconds or less in order to demonstrate decreased fall risk when transferring. Baseline: 3 sit-to-stand in 12 seconds, unable to complete due to fatigue Target date: 11/15/2021 Goal status: INITIAL   PLAN: PT  FREQUENCY: 2x/week  PT DURATION: 8 weeks  PLANNED INTERVENTIONS: Therapeutic exercises, Therapeutic activity, Neuromuscular re-education, Balance training, Gait training, Patient/Family education, Joint mobilization, Stair training, Aquatic Therapy, Dry Needling, Cryotherapy, Moist heat, Taping, Vasopneumatic device, and Manual therapy  PLAN FOR NEXT SESSION: Progress closed-chain knee/ hip strengthening; *assess 6MWT, squat, and hip strengthVanessa [name], PT, DPT 09/20/21 11:21 AM

## 2021-09-20 ENCOUNTER — Ambulatory Visit: Payer: Medicare Other | Attending: Primary Care

## 2021-09-20 ENCOUNTER — Other Ambulatory Visit: Payer: Self-pay

## 2021-09-20 DIAGNOSIS — M6281 Muscle weakness (generalized): Secondary | ICD-10-CM | POA: Insufficient documentation

## 2021-09-20 DIAGNOSIS — G8929 Other chronic pain: Secondary | ICD-10-CM | POA: Diagnosis present

## 2021-09-20 DIAGNOSIS — M25562 Pain in left knee: Secondary | ICD-10-CM | POA: Insufficient documentation

## 2021-09-20 DIAGNOSIS — R262 Difficulty in walking, not elsewhere classified: Secondary | ICD-10-CM | POA: Insufficient documentation

## 2021-09-20 NOTE — Patient Instructions (Signed)
Walking program: ? ? ?

## 2021-09-21 NOTE — Therapy (Incomplete)
?OUTPATIENT PHYSICAL THERAPY TREATMENT NOTE ? ? ?Patient Name: [name] [name] ?MRN: [id number] ?DOB:May 04, 1961, 61 y.o., female ?Today's Date: 09/21/2021 ? ?PCP: Kerin Perna, NP ?REFERRING PROVIDER: Kerin Perna, NP ? ? ? ?Past Medical History:  ?Diagnosis Date  ? Asthma   ? Bloody stool   ? Dental decay   ? Gastritis, Helicobacter pylori 40/98/1191  ? Hypertension   ? Pneumonia 06/21/2015  ? Prepyloric ulcer 06/22/2015  ? Seasonal allergies   ? Shortness of breath dyspnea   ? with exertion  ? ?Past Surgical History:  ?Procedure Laterality Date  ? BARTHOLIN CYST MARSUPIALIZATION Left 1997  ? [place]  ? Repair of PFO  ? COLONOSCOPY N/A 2002  ? Performed secondary to mother's diagnosis of colon CA at age 46  ? ESOPHAGOGASTRODUODENOSCOPY (EGD) WITH PROPOFOL N/A 06/22/2015  ? Procedure: ESOPHAGOGASTRODUODENOSCOPY (EGD) WITH PROPOFOL;  Surgeon: Arta Silence, MD;  Location: [facility] ENDOSCOPY;  Service: Endoscopy;  Laterality: N/A;  ? FRACTURE SURGERY    ? NASAL SINUS SURGERY Bilateral 1984  ? OVARIAN CYST REMOVAL Left 1990  ? TUBAL LIGATION    ? UMBILICAL HERNIA REPAIR N/A 2005  ? ?Patient Active Problem List  ? Diagnosis Date Noted  ? Respiratory failure ([place]) 05/14/2021  ? Snoring 03/28/2021  ? Acute respiratory failure ([place]) 03/19/2021  ? Chronic left hip pain 03/12/2021  ? Chronic left shoulder pain 03/12/2021  ? Mild intermittent asthma without complication 47/82/9562  ? Lump of axilla, left 03/12/2021  ? Anxiety with depression 10/06/2017  ? Healthcare maintenance 10/06/2017  ? Osteoarthritis, multiple sites 10/06/2017  ? Alcohol use disorder 07/06/2015  ? Prepyloric ulcer 06/22/2015  ? Elevated blood pressure reading with diagnosis of hypertension 04/14/2015  ? Seasonal allergies 04/14/2015  ? ? ?REFERRING DIAG: M25.562 (ICD-10-CM) - Left knee pain, unspecified chronicity ? ?THERAPY DIAG:  ?No diagnosis found. ? ?PERTINENT HISTORY: Lt tibia fx and ORIF in 2005 ? ? ? ?SUBJECTIVE: *** ? ?PAIN:   ?Are you having pain? Yes ?NPRS scale: 4/10 ?Pain location: Lt anterior knee ?PAIN TYPE: aching, dull, and sharp ?Pain description: constant  ?Aggravating factors: prolonged standing >15 minutes, prolonged walking >15 minutes, sitting >15-20 minutes ?Relieving factors: Voltaren gel, elevation, rest, and wearing her soft knee brace ? ? ? ? ?OBJECTIVE:  ? *Unless otherwise noted, objective information collected previously* ? ?DIAGNOSTIC FINDINGS: 07/30/2018: Left Knee DG: IMPRESSION: ?1. Tricompartmental osteoarthritis of the left knee most severe in ?medial femorotibial compartment. ?  ?PATIENT SURVEYS:  ?FOTO 33%, predicted 54% in 15 visits ?  ?COGNITION: ?         Overall cognitive status: Within functional limits for tasks assessed              ?          ?SENSATION: ?         Light touch: Appears intact ?  ?MUSCLE LENGTH: ?Hamstrings:  ?Thomas test:  ?  ?POSTURE:  ?Forward posture, offloading Lt LE ?  ?PALPATION: ?TTP to medial/ lateral knee compartment, swelling noted about the patella ?  ?PASSIVE ACCESSORIES: ?Hypomobile Lt patellar mobilization in all planes ?  ?LE AROM: ?  ?AROM Right ?09/20/2021 Left ?09/20/2021  ?Knee flexion 120 116  ?Knee extension 0 0  ? (Blank rows = not tested) ?  ?LE MMT: ?  ?MMT Right ?09/20/2021 Left ?09/20/2021  ?Hip flexion      ?Hip extension      ?Hip abduction      ?Knee flexion 5/5 5/5  ?  Knee extension 5/5 4+/5p!  ? (Blank rows = not tested) ?  ?LOWER EXTREMITY SPECIAL TESTS:  ?Knee special tests: Patellafemoral apprehension test: positive , Lateral pull sign: negative, Patellafemoral grind test: positive , and Step up/down test: positive  Hoffa's fat pad test: neg, Brush test (+) ?  ?FUNCTIONAL TESTS:  ?5 times sit to stand: 3 sit-to-stand in 12 seconds, unable to continue due to fatigue ?6 minute walk test: Deferred due to SOB related to pt's asthma  ?Squat: Unable to assess ?  ?  ?  ?TODAY'S TREATMENT: ?[place] [facility] PT Treatment:                                                DATE:  *** ?Therapeutic Exercise: ?*** ?Manual Therapy: ?*** ?Neuromuscular re-ed: ?*** ?Therapeutic Activity: ?*** ?Modalities: ?*** ?Self Care: ?*** ? ?  ?  ?PATIENT EDUCATION:  ?Education details: Pt educated on probable underlying pathophysiology behind her pain presentation, POC, prognosis, FOTO, and HEP ?Person educated: Patient ?Education method: Explanation, Demonstration, and Handouts ?Education comprehension: verbalized understanding and returned demonstration ?  ?  ?HOME EXERCISE PROGRAM: ? ?  ?ASSESSMENT: ?  ?*** ?  ?  ?OBJECTIVE IMPAIRMENTS Abnormal gait, decreased activity tolerance, decreased balance, decreased endurance, decreased mobility, difficulty walking, decreased ROM, decreased strength, hypomobility, impaired flexibility, improper body mechanics, postural dysfunction, and pain.  ?  ?ACTIVITY LIMITATIONS cleaning, community activity, occupation, laundry, yard work, and shopping.  ?  ?PERSONAL FACTORS Age and 1-2 comorbidities: asthma, HTN  are also affecting patient's functional outcome.  ?  ?  ?REHAB POTENTIAL: Fair Due to chronicity of sxs and decreased endurance ?  ?CLINICAL DECISION MAKING: Evolving/moderate complexity ?  ?EVALUATION COMPLEXITY: Moderate ?  ?  ?GOALS: ?Goals reviewed with patient? No ?  ?SHORT TERM GOALS: ?  ?Pt will report understanding and adherence to her HEP in order to promote independence in the management of her primary impairments. ?Baseline: HEP provided at eval ?Target date: 10/18/2021 ?Goal status: INITIAL ?  ?  ?  ?LONG TERM GOALS: ?  ?Pt will achieve Lt knee extension MMT of 5/5 with 0-3/10 pain in order to progress her independent LE strengthening regimen with less limitation. ?Baseline: 4+/5 with 8/10 pain ?Target date: 11/15/2021 ?Goal status: INITIAL ?  ?2.  Pt will achieve a FOTO score of 54% in order to demonstrate improved functional ability as it relates to her knee pain. ?Baseline: 33% ?Target date: 11/15/2021 ?Goal status: INITIAL ?  ?3.  Pt will report  ability to stand 30 minutes in order to return to washing dishes and cooking with less limitation. ?Baseline: Unable to stand >15 minutes due to knee pain ?Target date: 11/15/2021 ?Goal status: INITIAL ?  ?4.  Pt will achieve a 6MWT with gait speed of ___ in order to demonstrate safe community ambulation for grocery shopping. ?Baseline: Not performed due to asthma exacerbation; assess as able at first treatment session. ?Target date: 11/15/2021 ?Goal status: INITIAL ?  ?5.  Pt will achieve 5xSTS in 12 seconds or less in order to demonstrate decreased fall risk when transferring. ?Baseline: 3 sit-to-stand in 12 seconds, unable to complete due to fatigue ?Target date: 11/15/2021 ?Goal status: INITIAL ?  ?  ?PLAN: ?PT FREQUENCY: 2x/week ?  ?PT DURATION: 8 weeks ?  ?PLANNED INTERVENTIONS: Therapeutic exercises, Therapeutic activity, Neuromuscular re-education, Balance training, Gait training, Patient/Family education, Joint mobilization,  Stair training, Aquatic Therapy, Dry Needling, Cryotherapy, Moist heat, Taping, Vasopneumatic device, and Manual therapy ?  ?PLAN FOR NEXT SESSION: Progress closed-chain knee/ hip strengthening; *assess 6MWT, squat, and hip strength* ? ? ? ?Vanessa [place], PT, DPT ?09/21/21 12:34 PM ? ? ?  ? ?

## 2021-09-24 ENCOUNTER — Ambulatory Visit: Payer: Medicare Other

## 2021-09-26 ENCOUNTER — Ambulatory Visit: Payer: Medicare Other

## 2021-10-03 ENCOUNTER — Ambulatory Visit (INDEPENDENT_AMBULATORY_CARE_PROVIDER_SITE_OTHER): Payer: Medicare Other | Admitting: Primary Care

## 2021-10-22 ENCOUNTER — Ambulatory Visit (INDEPENDENT_AMBULATORY_CARE_PROVIDER_SITE_OTHER): Payer: Self-pay

## 2021-10-22 NOTE — Telephone Encounter (Signed)
Routed to PCP. Patient last seen by pulmonology in 9/22. She has cancelled or was a NO SHOW to subsequent scheduled appointments with their office. Please advise on request for nebulizer.  ?

## 2021-10-22 NOTE — Telephone Encounter (Signed)
Called patient voicemail full

## 2021-10-22 NOTE — Telephone Encounter (Signed)
?  Chief Complaint: Wheezing ?Symptoms: Wheezing ?Frequency: Since last night ?Pertinent Negatives: Patient denies  ?Disposition: '[]'$ ED /'[]'$ Urgent Care (no appt availability in office) / '[]'$ Appointment(In office/virtual)/ '[]'$  [facility]/ '[]'$ Home Care/ '[]'$ Refused Recommended Disposition /'[]'$ [facility]/ '[]'$  Follow-up with PCP ?Additional Notes: Pt is awaiting delivery of her breathing medications. She will increase her o2 to 3 liters in the meantime. Pt would like a nebulizer. She told me I was upsetting her and terminated call.  Pt will go to ED if needed. ? ? ? ?Reason for Disposition ? [1] Longstanding difficulty breathing (e.g., CHF, COPD, emphysema) AND [2] WORSE than normal ? ?Answer Assessment - Initial Assessment Questions ?1. RESPIRATORY STATUS: "Describe your breathing?" (e.g., wheezing, shortness of breath, unable to speak, severe coughing)  ?    wheezing ?2. ONSET: "When did this breathing problem begin?"  ?    Last night ?3. PATTERN "Does the difficult breathing come and go, or has it been constant since it started?"  ?    constant ?4. SEVERITY: "How bad is your breathing?" (e.g., mild, moderate, severe)  ?  - MILD: No SOB at rest, mild SOB with walking, speaks normally in sentences, can lie down, no retractions, pulse < 100.  ?  - MODERATE: SOB at rest, SOB with minimal exertion and prefers to sit, cannot lie down flat, speaks in phrases, mild retractions, audible wheezing, pulse 100-120.  ?  - SEVERE: Very SOB at rest, speaks in single words, struggling to breathe, sitting hunched forward, retractions, pulse > 120  ?    moderate ?5. RECURRENT SYMPTOM: "Have you had difficulty breathing before?" If Yes, ask: "When was the last time?" and "What happened that time?"  ?    Yes - october ?6. CARDIAC HISTORY: "Do you have any history of heart disease?" (e.g., heart attack, angina, bypass surgery, angioplasty)  ?    na ?7. LUNG HISTORY: "Do you have any history of lung disease?"  (e.g.,  pulmonary embolus, asthma, emphysema) ?    yes ?8. CAUSE: "What do you think is causing the breathing problem?"  ?    Asthma - allergies ?9. OTHER SYMPTOMS: "Do you have any other symptoms? (e.g., dizziness, runny nose, cough, chest pain, fever) ?    unknown ?10. O2 SATURATION MONITOR:  "Do you use an oxygen saturation monitor (pulse oximeter) at home?" If Yes, "What is your reading (oxygen level) today?" "What is your usual oxygen saturation reading?" (e.g., 95%) ?      no ?11. PREGNANCY: "Is there any chance you are pregnant?" "When was your last menstrual period?" ?      no ?12. TRAVEL: "Have you traveled out of the country in the last month?" (e.g., travel history, exposures) ?      no ? ?Protocols used: Breathing Difficulty-A-AH ? ?

## 2021-10-27 ENCOUNTER — Encounter (HOSPITAL_COMMUNITY): Payer: Self-pay | Admitting: Emergency Medicine

## 2021-10-27 ENCOUNTER — Inpatient Hospital Stay (HOSPITAL_COMMUNITY)
Admission: EM | Admit: 2021-10-27 | Discharge: 2021-11-05 | DRG: 190 | Disposition: A | Discharge status: Home / Self Care | Payer: Medicare Other | Attending: Student | Admitting: Student

## 2021-10-27 ENCOUNTER — Other Ambulatory Visit: Payer: Self-pay

## 2021-10-27 ENCOUNTER — Emergency Department (HOSPITAL_COMMUNITY): Payer: Medicare Other

## 2021-10-27 DIAGNOSIS — E875 Hyperkalemia: Secondary | ICD-10-CM | POA: Diagnosis present

## 2021-10-27 DIAGNOSIS — D75839 Thrombocytosis, unspecified: Secondary | ICD-10-CM | POA: Diagnosis present

## 2021-10-27 DIAGNOSIS — K635 Polyp of colon: Secondary | ICD-10-CM | POA: Diagnosis present

## 2021-10-27 DIAGNOSIS — I5043 Acute on chronic combined systolic (congestive) and diastolic (congestive) heart failure: Secondary | ICD-10-CM | POA: Diagnosis present

## 2021-10-27 DIAGNOSIS — I088 Other rheumatic multiple valve diseases: Secondary | ICD-10-CM | POA: Diagnosis not present

## 2021-10-27 DIAGNOSIS — I4891 Unspecified atrial fibrillation: Secondary | ICD-10-CM | POA: Diagnosis not present

## 2021-10-27 DIAGNOSIS — E878 Other disorders of electrolyte and fluid balance, not elsewhere classified: Secondary | ICD-10-CM | POA: Diagnosis not present

## 2021-10-27 DIAGNOSIS — F411 Generalized anxiety disorder: Secondary | ICD-10-CM | POA: Diagnosis present

## 2021-10-27 DIAGNOSIS — D649 Anemia, unspecified: Secondary | ICD-10-CM

## 2021-10-27 DIAGNOSIS — D120 Benign neoplasm of cecum: Secondary | ICD-10-CM | POA: Diagnosis present

## 2021-10-27 DIAGNOSIS — I10 Essential (primary) hypertension: Secondary | ICD-10-CM | POA: Diagnosis not present

## 2021-10-27 DIAGNOSIS — K449 Diaphragmatic hernia without obstruction or gangrene: Secondary | ICD-10-CM | POA: Diagnosis present

## 2021-10-27 DIAGNOSIS — I959 Hypotension, unspecified: Secondary | ICD-10-CM | POA: Diagnosis not present

## 2021-10-27 DIAGNOSIS — T380X5A Adverse effect of glucocorticoids and synthetic analogues, initial encounter: Secondary | ICD-10-CM | POA: Diagnosis present

## 2021-10-27 DIAGNOSIS — D509 Iron deficiency anemia, unspecified: Secondary | ICD-10-CM | POA: Diagnosis present

## 2021-10-27 DIAGNOSIS — Z9981 Dependence on supplemental oxygen: Secondary | ICD-10-CM

## 2021-10-27 DIAGNOSIS — Z79899 Other long term (current) drug therapy: Secondary | ICD-10-CM

## 2021-10-27 DIAGNOSIS — D638 Anemia in other chronic diseases classified elsewhere: Secondary | ICD-10-CM | POA: Diagnosis present

## 2021-10-27 DIAGNOSIS — R739 Hyperglycemia, unspecified: Secondary | ICD-10-CM | POA: Diagnosis not present

## 2021-10-27 DIAGNOSIS — I5033 Acute on chronic diastolic (congestive) heart failure: Secondary | ICD-10-CM | POA: Diagnosis present

## 2021-10-27 DIAGNOSIS — D72825 Bandemia: Secondary | ICD-10-CM | POA: Diagnosis not present

## 2021-10-27 DIAGNOSIS — E872 Acidosis, unspecified: Secondary | ICD-10-CM | POA: Diagnosis present

## 2021-10-27 DIAGNOSIS — Z881 Allergy status to other antibiotic agents status: Secondary | ICD-10-CM

## 2021-10-27 DIAGNOSIS — Z8701 Personal history of pneumonia (recurrent): Secondary | ICD-10-CM

## 2021-10-27 DIAGNOSIS — Z20822 Contact with and (suspected) exposure to covid-19: Secondary | ICD-10-CM | POA: Diagnosis present

## 2021-10-27 DIAGNOSIS — E871 Hypo-osmolality and hyponatremia: Secondary | ICD-10-CM | POA: Diagnosis present

## 2021-10-27 DIAGNOSIS — J9621 Acute and chronic respiratory failure with hypoxia: Secondary | ICD-10-CM | POA: Insufficient documentation

## 2021-10-27 DIAGNOSIS — K3189 Other diseases of stomach and duodenum: Secondary | ICD-10-CM | POA: Diagnosis not present

## 2021-10-27 DIAGNOSIS — F1010 Alcohol abuse, uncomplicated: Secondary | ICD-10-CM | POA: Diagnosis present

## 2021-10-27 DIAGNOSIS — Z8711 Personal history of peptic ulcer disease: Secondary | ICD-10-CM

## 2021-10-27 DIAGNOSIS — Z7951 Long term (current) use of inhaled steroids: Secondary | ICD-10-CM

## 2021-10-27 DIAGNOSIS — K648 Other hemorrhoids: Secondary | ICD-10-CM | POA: Diagnosis present

## 2021-10-27 DIAGNOSIS — I5032 Chronic diastolic (congestive) heart failure: Secondary | ICD-10-CM

## 2021-10-27 DIAGNOSIS — I480 Paroxysmal atrial fibrillation: Secondary | ICD-10-CM | POA: Diagnosis present

## 2021-10-27 DIAGNOSIS — F32A Depression, unspecified: Secondary | ICD-10-CM | POA: Diagnosis present

## 2021-10-27 DIAGNOSIS — J449 Chronic obstructive pulmonary disease, unspecified: Secondary | ICD-10-CM | POA: Diagnosis not present

## 2021-10-27 DIAGNOSIS — Z800 Family history of malignant neoplasm of digestive organs: Secondary | ICD-10-CM | POA: Diagnosis not present

## 2021-10-27 DIAGNOSIS — I509 Heart failure, unspecified: Secondary | ICD-10-CM | POA: Diagnosis not present

## 2021-10-27 DIAGNOSIS — D500 Iron deficiency anemia secondary to blood loss (chronic): Secondary | ICD-10-CM | POA: Diagnosis not present

## 2021-10-27 DIAGNOSIS — Z9851 Tubal ligation status: Secondary | ICD-10-CM

## 2021-10-27 DIAGNOSIS — I110 Hypertensive heart disease with heart failure: Secondary | ICD-10-CM | POA: Diagnosis present

## 2021-10-27 DIAGNOSIS — E876 Hypokalemia: Secondary | ICD-10-CM | POA: Diagnosis present

## 2021-10-27 DIAGNOSIS — I493 Ventricular premature depolarization: Secondary | ICD-10-CM | POA: Diagnosis present

## 2021-10-27 DIAGNOSIS — R0689 Other abnormalities of breathing: Secondary | ICD-10-CM | POA: Diagnosis not present

## 2021-10-27 DIAGNOSIS — J45901 Unspecified asthma with (acute) exacerbation: Secondary | ICD-10-CM | POA: Diagnosis present

## 2021-10-27 DIAGNOSIS — D501 Sideropenic dysphagia: Secondary | ICD-10-CM | POA: Diagnosis not present

## 2021-10-27 DIAGNOSIS — B3781 Candidal esophagitis: Secondary | ICD-10-CM | POA: Diagnosis present

## 2021-10-27 DIAGNOSIS — K229 Disease of esophagus, unspecified: Secondary | ICD-10-CM | POA: Diagnosis not present

## 2021-10-27 DIAGNOSIS — Z7901 Long term (current) use of anticoagulants: Secondary | ICD-10-CM

## 2021-10-27 DIAGNOSIS — J441 Chronic obstructive pulmonary disease with (acute) exacerbation: Principal | ICD-10-CM | POA: Diagnosis present

## 2021-10-27 DIAGNOSIS — F418 Other specified anxiety disorders: Secondary | ICD-10-CM | POA: Diagnosis present

## 2021-10-27 DIAGNOSIS — R55 Syncope and collapse: Secondary | ICD-10-CM | POA: Diagnosis not present

## 2021-10-27 DIAGNOSIS — E66813 Obesity, class 3: Secondary | ICD-10-CM

## 2021-10-27 DIAGNOSIS — E0781 Sick-euthyroid syndrome: Secondary | ICD-10-CM | POA: Diagnosis not present

## 2021-10-27 DIAGNOSIS — J4541 Moderate persistent asthma with (acute) exacerbation: Secondary | ICD-10-CM

## 2021-10-27 DIAGNOSIS — G8929 Other chronic pain: Secondary | ICD-10-CM | POA: Diagnosis present

## 2021-10-27 DIAGNOSIS — Z8249 Family history of ischemic heart disease and other diseases of the circulatory system: Secondary | ICD-10-CM

## 2021-10-27 DIAGNOSIS — R1084 Generalized abdominal pain: Secondary | ICD-10-CM | POA: Diagnosis not present

## 2021-10-27 HISTORY — DX: Chronic diastolic (congestive) heart failure: I50.32

## 2021-10-27 HISTORY — DX: Paroxysmal atrial fibrillation: I48.0

## 2021-10-27 LAB — CBC WITH DIFFERENTIAL/PLATELET
Abs Immature Granulocytes: 0.05 10*3/uL (ref 0.00–0.07)
Basophils Absolute: 0.1 10*3/uL (ref 0.0–0.1)
Basophils Relative: 1 %
Eosinophils Absolute: 0.5 10*3/uL (ref 0.0–0.5)
Eosinophils Relative: 4 %
HCT: 32.6 % — ABNORMAL LOW (ref 36.0–46.0)
Hemoglobin: 10.5 g/dL — ABNORMAL LOW (ref 12.0–15.0)
Immature Granulocytes: 0 %
Lymphocytes Relative: 21 %
Lymphs Abs: 2.8 10*3/uL (ref 0.7–4.0)
MCH: 26.7 pg (ref 26.0–34.0)
MCHC: 32.2 g/dL (ref 30.0–36.0)
MCV: 83 fL (ref 80.0–100.0)
Monocytes Absolute: 1.2 10*3/uL — ABNORMAL HIGH (ref 0.1–1.0)
Monocytes Relative: 9 %
Neutro Abs: 8.5 10*3/uL — ABNORMAL HIGH (ref 1.7–7.7)
Neutrophils Relative %: 65 %
Platelets: 410 10*3/uL — ABNORMAL HIGH (ref 150–400)
RBC: 3.93 MIL/uL (ref 3.87–5.11)
RDW: 17.7 % — ABNORMAL HIGH (ref 11.5–15.5)
WBC: 13.1 10*3/uL — ABNORMAL HIGH (ref 4.0–10.5)
nRBC: 0 % (ref 0.0–0.2)

## 2021-10-27 LAB — BLOOD GAS, VENOUS
Acid-Base Excess: 11.6 mmol/L — ABNORMAL HIGH (ref 0.0–2.0)
Bicarbonate: 37.4 mmol/L — ABNORMAL HIGH (ref 20.0–28.0)
O2 Saturation: 62.5 %
Patient temperature: 37
pCO2, Ven: 55 mmHg (ref 44–60)
pH, Ven: 7.44 — ABNORMAL HIGH (ref 7.25–7.43)
pO2, Ven: 37 mmHg (ref 32–45)

## 2021-10-27 LAB — BASIC METABOLIC PANEL
Anion gap: 11 (ref 5–15)
BUN: 8 mg/dL (ref 6–20)
CO2: 32 mmol/L (ref 22–32)
Calcium: 9.6 mg/dL (ref 8.9–10.3)
Chloride: 96 mmol/L — ABNORMAL LOW (ref 98–111)
Creatinine, Ser: 0.55 mg/dL (ref 0.44–1.00)
GFR, Estimated: >60 mL/min (ref >60)
Glucose, Bld: 137 mg/dL — ABNORMAL HIGH (ref 70–99)
Potassium: 2.8 mmol/L — ABNORMAL LOW (ref 3.5–5.1)
Sodium: 139 mmol/L (ref 135–145)

## 2021-10-27 LAB — MAGNESIUM: Magnesium: 1.6 mg/dL — ABNORMAL LOW (ref 1.7–2.4)

## 2021-10-27 LAB — RESP PANEL BY RT-PCR (FLU A&B, COVID) ARPGX2
Influenza A by PCR: NEGATIVE
Influenza B by PCR: NEGATIVE
SARS Coronavirus 2 by RT PCR: NEGATIVE

## 2021-10-27 LAB — TROPONIN I (HIGH SENSITIVITY): Troponin I (High Sensitivity): 14 ng/L (ref <18)

## 2021-10-27 LAB — BRAIN NATRIURETIC PEPTIDE: B Natriuretic Peptide: 173.6 pg/mL — ABNORMAL HIGH (ref 0.0–100.0)

## 2021-10-27 MED ORDER — FUROSEMIDE 40 MG PO TABS
40.0000 mg | ORAL_TABLET | Freq: Every day | ORAL | Status: DC
  Administered 2021-10-28: 40 mg via ORAL
  Filled 2021-10-27 (×2): qty 1

## 2021-10-27 MED ORDER — METOPROLOL TARTRATE 5 MG/5ML IV SOLN
5.0000 mg | INTRAVENOUS | Status: DC | PRN
  Administered 2021-10-29 (×2): 5 mg via INTRAVENOUS
  Filled 2021-10-27 (×3): qty 5

## 2021-10-27 MED ORDER — AMLODIPINE BESYLATE 10 MG PO TABS
10.0000 mg | ORAL_TABLET | Freq: Every day | ORAL | Status: DC
  Administered 2021-10-28: 10 mg via ORAL
  Filled 2021-10-27 (×2): qty 1

## 2021-10-27 MED ORDER — ORAL CARE MOUTH RINSE
15.0000 mL | Freq: Two times a day (BID) | OROMUCOSAL | Status: DC
  Administered 2021-10-28 – 2021-11-03 (×7): 15 mL via OROMUCOSAL

## 2021-10-27 MED ORDER — MAGNESIUM SULFATE 2 GM/50ML IV SOLN
2.0000 g | Freq: Once | INTRAVENOUS | Status: AC
  Administered 2021-10-27: 2 g via INTRAVENOUS
  Filled 2021-10-27: qty 50

## 2021-10-27 MED ORDER — METHYLPREDNISOLONE SODIUM SUCC 125 MG IJ SOLR
80.0000 mg | Freq: Two times a day (BID) | INTRAMUSCULAR | Status: DC
  Administered 2021-10-28 – 2021-10-30 (×5): 80 mg via INTRAVENOUS
  Filled 2021-10-27 (×5): qty 2

## 2021-10-27 MED ORDER — ALBUTEROL (5 MG/ML) CONTINUOUS INHALATION SOLN
10.0000 mg/h | INHALATION_SOLUTION | Freq: Once | RESPIRATORY_TRACT | Status: DC
  Filled 2021-10-27: qty 20

## 2021-10-27 MED ORDER — IRBESARTAN 75 MG PO TABS
75.0000 mg | ORAL_TABLET | Freq: Every day | ORAL | Status: DC
Start: 2021-10-28 — End: 2021-10-29
  Administered 2021-10-28: 75 mg via ORAL
  Filled 2021-10-27 (×2): qty 1

## 2021-10-27 MED ORDER — POTASSIUM CHLORIDE 10 MEQ/100ML IV SOLN
10.0000 meq | INTRAVENOUS | Status: AC
  Administered 2021-10-27: 10 meq via INTRAVENOUS
  Filled 2021-10-27 (×3): qty 100

## 2021-10-27 MED ORDER — ACETAMINOPHEN 325 MG PO TABS
650.0000 mg | ORAL_TABLET | Freq: Four times a day (QID) | ORAL | Status: DC | PRN
  Administered 2021-11-01 – 2021-11-03 (×2): 650 mg via ORAL
  Filled 2021-10-27 (×2): qty 2

## 2021-10-27 MED ORDER — ALBUTEROL SULFATE (2.5 MG/3ML) 0.083% IN NEBU
2.5000 mg | INHALATION_SOLUTION | Freq: Once | RESPIRATORY_TRACT | Status: AC
  Administered 2021-10-27: 2.5 mg via RESPIRATORY_TRACT
  Filled 2021-10-27: qty 3

## 2021-10-27 MED ORDER — LEVALBUTEROL HCL 1.25 MG/0.5ML IN NEBU
1.2500 mg | INHALATION_SOLUTION | Freq: Four times a day (QID) | RESPIRATORY_TRACT | Status: DC
Start: 2021-10-28 — End: 2021-10-30
  Administered 2021-10-27 – 2021-10-30 (×11): 1.25 mg via RESPIRATORY_TRACT
  Filled 2021-10-27 (×12): qty 0.5

## 2021-10-27 MED ORDER — METHYLPREDNISOLONE SODIUM SUCC 125 MG IJ SOLR
125.0000 mg | Freq: Once | INTRAMUSCULAR | Status: AC
  Administered 2021-10-27: 125 mg via INTRAVENOUS
  Filled 2021-10-27: qty 2

## 2021-10-27 MED ORDER — ACETAMINOPHEN 650 MG RE SUPP: 650.0000 mg | Freq: Four times a day (QID) | RECTAL | Status: DC | PRN

## 2021-10-27 MED ORDER — CHLORHEXIDINE GLUCONATE CLOTH 2 % EX PADS
6.0000 | MEDICATED_PAD | Freq: Every day | CUTANEOUS | Status: DC
  Administered 2021-10-27 – 2021-10-30 (×4): 6 via TOPICAL

## 2021-10-27 MED ORDER — CHLORHEXIDINE GLUCONATE 0.12 % MT SOLN
15.0000 mL | Freq: Two times a day (BID) | OROMUCOSAL | Status: DC
  Administered 2021-10-28 – 2021-11-05 (×15): 15 mL via OROMUCOSAL
  Filled 2021-10-27 (×14): qty 15

## 2021-10-27 MED ORDER — POTASSIUM CHLORIDE CRYS ER 20 MEQ PO TBCR: 20.0000 meq | EXTENDED_RELEASE_TABLET | Freq: Every day | ORAL | Status: DC

## 2021-10-27 MED ORDER — IPRATROPIUM BROMIDE 0.02 % IN SOLN
0.5000 mg | Freq: Four times a day (QID) | RESPIRATORY_TRACT | Status: DC
  Administered 2021-10-27 – 2021-10-29 (×6): 0.5 mg via RESPIRATORY_TRACT
  Filled 2021-10-27 (×6): qty 2.5

## 2021-10-27 MED ORDER — HYDROXYZINE HCL 25 MG PO TABS
25.0000 mg | ORAL_TABLET | Freq: Three times a day (TID) | ORAL | Status: DC | PRN
  Administered 2021-10-28 – 2021-11-04 (×10): 25 mg via ORAL
  Filled 2021-10-27 (×11): qty 1

## 2021-10-27 MED ORDER — SODIUM CHLORIDE 0.9 % IV SOLN
500.0000 mg | INTRAVENOUS | Status: DC
  Administered 2021-10-27: 500 mg via INTRAVENOUS
  Filled 2021-10-27: qty 5

## 2021-10-27 MED ORDER — POTASSIUM CHLORIDE 10 MEQ/100ML IV SOLN
10.0000 meq | INTRAVENOUS | Status: AC
  Administered 2021-10-27 (×2): 10 meq via INTRAVENOUS
  Filled 2021-10-27 (×2): qty 100

## 2021-10-27 MED ORDER — LEVALBUTEROL HCL 1.25 MG/0.5ML IN NEBU
1.2500 mg | INHALATION_SOLUTION | RESPIRATORY_TRACT | Status: DC | PRN
  Administered 2021-10-28: 1.25 mg via RESPIRATORY_TRACT

## 2021-10-27 NOTE — Assessment & Plan Note (Addendum)
Recent Labs  ?  10/27/21 ?1925 10/28/21 ?0020 10/30/21 ?0246 10/30/21 ?3014 10/31/21 ?8403 11/01/21 ?9795 11/02/21 ?3692 11/03/21 ?2300 11/04/21 ?9794 11/05/21 ?9971  ?HGB 10.5* 10.3* 9.0* 11.2* 10.7* 9.9* 9.9* 10.4* 10.0* 9.5*  ?H&H relatively stable.  Hemoccult negative.  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  Constantly uses ibuprofen.  Anemia panel with iron deficiency.  EGD and colonoscopy as above. ?-Had what looks like vasovagal episode while on commode about an hour after IV iron.  ?-Advised against NSAIDs. ? ? ?

## 2021-10-27 NOTE — ED Notes (Signed)
Admitting provider at bedside.

## 2021-10-27 NOTE — Assessment & Plan Note (Addendum)
TEE with LVEF of 25 to 30% with global and regional hypokinesis.  TTE with LVEF of 50 to 55% and G3-DD.  Grossly euvolemic. Diuresed with IV Lasix.  BNP down from 116-34.  ?-Torsemide 5 mg daily, Toprol-XL 50, Entresto 24/26, Farxiga 10 mg, Lipitor and Eliquis per card ?-Counseled on sodium and fluid restriction as well as daily weight ?-Counseled on alcohol cessation ?-Cardiology to arrange outpatient follow-up ?

## 2021-10-27 NOTE — Progress Notes (Signed)
Pt transported to ICU on BIPAP without complication.  

## 2021-10-27 NOTE — Assessment & Plan Note (Addendum)
S/p successful TEE cardioversion on 10/31/2021.  TEE with LVEF of 25 to 30% with global and regional hypokinesis.  TTE with LVEF of 50 to 55% and G3-DD.  TSH low but free T4 within normal. ?-Discharged on Toprol-XL 50 mg daily and Eliquis  ?-Counseled on alcohol cessation. ? ?

## 2021-10-27 NOTE — ED Provider Notes (Signed)
?[facility] DEPT ?Provider Note ? ? ?CSN: [id number] ?Arrival date & time: 10/27/21  3159 ? ?  ? ?History ? ?Chief Complaint  ?Patient presents with  ? Shortness of Breath  ? ? ?[name] [name] is a 61 y.o. female presenting from home in respiratory distress with difficulty breathing.  Patient reports she has been having some coughing and chest tightness for a few days but it significantly worsened today.  She arrives by EMS to give the patient a breathing treatment.  She wears 2 L nasal cannula at baseline at home. ? ?HPI ? ?  ? ?Home Medications ?Prior to Admission medications   ?Medication Sig Start Date End Date Taking? Authorizing Provider  ?albuterol (VENTOLIN HFA) 108 (90 Base) MCG/ACT inhaler INHALE 2 PUFFS INTO THE LUNGS EVERY 6 (SIX) HOURS AS NEEDED FOR WHEEZING OR SHORTNESS OF BREATH. 08/30/21   Kerin Perna, NP  ?amLODipine (NORVASC) 10 MG tablet Take 1 tablet (10 mg total) by mouth daily. 08/22/21   Kerin Perna, NP  ?Blood Pressure Monitor KIT 1 kit by Does not apply route 3 (three) times daily as needed. 09/01/19   Kerin Perna, NP  ?budesonide-formoterol (SYMBICORT) 160-4.5 MCG/ACT inhaler Inhale 2 puffs into the lungs 2 (two) times daily.    [provider]  ?cholecalciferol (VITAMIN D3) 25 MCG (1000 UNIT) tablet Take 1,000 Units by mouth daily.    [provider]  ?diclofenac Sodium (VOLTAREN) 1 % GEL Apply as needed twice a day for knee pain 08/29/21   Kerin Perna, NP  ?FLUoxetine (PROZAC) 20 MG capsule Take 1 capsule (20 mg total) by mouth daily. 03/11/21   Mayers, Cari S, PA-C  ?fluticasone furoate-vilanterol (BREO ELLIPTA) 200-25 MCG/INH AEPB Inhale 1 puff into the lungs daily. 03/28/21   Martyn Ehrich, NP  ?furosemide (LASIX) 40 MG tablet Take 1 tablet (40 mg total) by mouth daily. 03/25/21 03/25/22  Oswald Hillock, MD  ?hydrochlorothiazide (HYDRODIURIL) 25 MG tablet Take 1 tablet (25 mg total) by mouth daily. 08/22/21   Kerin Perna, NP  ?hydrOXYzine (ATARAX/VISTARIL) 25 MG tablet TAKE 1 TABLET (25 MG TOTAL) BY MOUTH EVERY 8 (EIGHT) HOURS AS NEEDED. ?Patient taking differently: Take 25 mg by mouth every 8 (eight) hours as needed for anxiety. 05/20/21   Kerin Perna, NP  ?loratadine (CLARITIN) 10 MG tablet Take 1 tablet (10 mg total) by mouth daily. ?Patient taking differently: Take 10 mg by mouth daily as needed for allergies. 12/14/19   Kerin Perna, NP  ?potassium chloride SA (KLOR-CON) 20 MEQ tablet Take 1 tablet (20 mEq total) by mouth daily. Take along with lasix 03/25/21   Oswald Hillock, MD  ?thiamine 100 MG tablet Take 1 tablet (100 mg total) by mouth daily. 03/26/21   Oswald Hillock, MD  ?traZODone (DESYREL) 50 MG tablet Take 1 tablet (50 mg total) by mouth at bedtime. 03/11/21   Mayers, Cari S, PA-C  ?valsartan (DIOVAN) 40 MG tablet Take 1 tablet (40 mg total) by mouth daily. 08/22/21   Kerin Perna, NP  ?   ? ?Allergies    ?Tetracyclines & related   ? ?Review of Systems   ?Review of Systems ? ?Physical Exam ?Updated Vital Signs ?BP (!) 153/71   Pulse (!) 57   Temp 98.4 ?F (36.9 ?C) (Oral)   Resp (!) 25   SpO2 100%  ?Physical Exam ?Constitutional:   ?   General: She is not in acute distress. ?  HENT:  ?   Head: Normocephalic and atraumatic.  ?Eyes:  ?   Conjunctiva/sclera: Conjunctivae normal.  ?   Pupils: Pupils are equal, round, and reactive to light.  ?Cardiovascular:  ?   Rate and Rhythm: Normal rate and regular rhythm.  ?Pulmonary:  ?   Effort: Pulmonary effort is normal. No respiratory distress.  ?   Comments: Tachypnea, audibly wheezing, speaking in short sentences, on 5L [place] ?Abdominal:  ?   General: There is no distension.  ?   Tenderness: There is no abdominal tenderness.  ?Skin: ?   General: Skin is warm and dry.  ?Neurological:  ?   General: No focal deficit present.  ?   Mental Status: She is alert. Mental status is at baseline.  ?Psychiatric:     ?   Mood and Affect: Mood normal.     ?   Behavior:  Behavior normal.  ? ? ?ED Results / Procedures / Treatments   ?Labs ?(all labs ordered are listed, but only abnormal results are displayed) ?Labs Reviewed  ?BASIC METABOLIC PANEL - Abnormal; Notable for the following components:  ?    Result Value  ? Potassium 2.8 (*)   ? Chloride 96 (*)   ? Glucose, Bld 137 (*)   ? All other components within normal limits  ?CBC WITH DIFFERENTIAL/PLATELET - Abnormal; Notable for the following components:  ? WBC 13.1 (*)   ? Hemoglobin 10.5 (*)   ? HCT 32.6 (*)   ? RDW 17.7 (*)   ? Platelets 410 (*)   ? Neutro Abs 8.5 (*)   ? Monocytes Absolute 1.2 (*)   ? All other components within normal limits  ?BRAIN NATRIURETIC PEPTIDE - Abnormal; Notable for the following components:  ? B Natriuretic Peptide 173.6 (*)   ? All other components within normal limits  ?BLOOD GAS, VENOUS - Abnormal; Notable for the following components:  ? pH, Ven 7.44 (*)   ? Bicarbonate 37.4 (*)   ? Acid-Base Excess 11.6 (*)   ? All other components within normal limits  ?RESP PANEL BY RT-PCR (FLU A&B, COVID) [id number]  ?TROPONIN I (HIGH SENSITIVITY)  ?TROPONIN I (HIGH SENSITIVITY)  ? ? ?EKG ?EKG Interpretation ? ?Date/Time:  Sunday October 27 2021 19:03:29 EDT ?Ventricular Rate:  127 ?PR Interval:  59 ?QRS Duration: 89 ?QT Interval:  308 ?QTC Calculation: 379 ?R Axis:   89 ?Text Interpretation: Interpretation Limited by significant motion artifact Confirmed by Octaviano Glow [phone]) on 10/27/2021 7:28:12 PM ? ?Radiology ?DG Chest Port 1 View ? ?Result Date: 10/27/2021 ?CLINICAL DATA:  Shortness of breath EXAM: PORTABLE CHEST 1 VIEW COMPARISON:  05/14/2021 FINDINGS: Cardiac shadow is stable. Lungs are hyperinflated but clear. No focal infiltrate or effusion is noted. No bony abnormality is seen. IMPRESSION: No acute abnormality noted. Electronically Signed   By: Inez Catalina M.D.   On: 10/27/2021 19:42   ? ?Procedures ?Marland KitchenCritical Care ?Performed by: Wyvonnia Dusky, MD ?Authorized by: Wyvonnia Dusky, MD  ? ?Critical  care provider statement:  ?  Critical care time (minutes):  45 ?  Critical care time was exclusive of:  Separately billable procedures and treating other patients ?  Critical care was necessary to treat or prevent imminent or life-threatening deterioration of the following conditions:  Respiratory failure ?  Critical care was time spent personally by me on the following activities:  Ordering and performing treatments and interventions, ordering and review of laboratory studies, ordering and review of radiographic studies, pulse oximetry,  review of old charts, examination of patient and evaluation of patient's response to treatment ?Comments:  ?   COPD exacerbation, bipap, reassessment  ? ? ?Medications Ordered in ED ?Medications  ?potassium chloride 10 mEq in 100 mL IVPB (10 mEq Intravenous New Bag/Given 10/27/21 2049)  ?magnesium sulfate IVPB 2 g 50 mL (0 g Intravenous Stopped 10/27/21 2026)  ?methylPREDNISolone sodium succinate (SOLU-MEDROL) 125 mg/2 mL injection 125 mg (125 mg Intravenous Given 10/27/21 1924)  ?albuterol (PROVENTIL) (2.5 MG/3ML) 0.083% nebulizer solution 2.5 mg (2.5 mg Nebulization Given 10/27/21 1926)  ? ? ?ED Course/ Medical Decision Making/ A&P ?Clinical Course as of 10/27/21 2119  ?Nancy Fetter Oct 27, 2021  ?1912 RT called for bipap [MT]  ?1916 ECG with significant motion artifact [MT]  ?1928 Patient is now on BiPAP, appears to be breathing more comfortably [MT]  ?2038 Patient reassessed and appears to be breathing much more comfortably now.  Respiratory rate 25 [MT]  ?2117 Admitted to hospitalist [MT]  ?  ?Clinical Course User Index ?[MT] Wyvonnia Dusky, MD  ? ?                        ?Medical Decision Making ?Amount and/or Complexity of Data Reviewed ?Labs: ordered. ?Radiology: ordered. ? ?Risk ?Prescription drug management. ?Decision regarding hospitalization. ? ? ?This patient presents to the ED with concern for shortness of breath. This involves an extensive number of treatment options, and is a  complaint that carries with it a high risk of complications and morbidity.  The differential diagnosis includes COPD/asthma vs CHF exacerbation vs PNA vs other ? ?Co-morbidities that complicate the patient eva

## 2021-10-27 NOTE — ED Notes (Signed)
RT called for pt placement on BiPap ?

## 2021-10-27 NOTE — ED Notes (Signed)
ED TO INPATIENT HANDOFF REPORT ? ?ED Nurse Name and Phone #: Mel Almond  ? ?S ?Name/Age/Gender ?[name] [name] ?61 y.o. ?female ?Room/Bed: WA11/WA11 ? ?Code Status ?  Code Status: Full Code ? ?Home/SNF/Other ?Home ?Patient oriented to: self, place, time, and situation ?Is this baseline? Yes  ? ?Triage Complete: Triage complete  ?Chief Complaint ?Acute exacerbation of chronic obstructive pulmonary disease (COPD) ([place]) [J44.1] ? ?Triage Note ?BIBA ?Per EMS: Pt coming from home w/ c/o SHOB. Hx asthma  ?'30mg'$  albuterol today  ?150/70 BP  ?100 HR  ?94% room air  ?Wheezes audible  ?Dyspnea w/ exertion   ? ?Allergies ?Allergies  ?Allergen Reactions  ? Tetracyclines & Related Itching  ? ? ?Level of Care/Admitting Diagnosis ?ED Disposition   ? ? ED Disposition  ?Admit  ? Condition  ?--  ? Comment  ?Hospital Area: [facility] [[id number]] ? Level of Care: Stepdown [14] ? Admit to SDU based on following criteria: Severe physiological/psychological symptoms:  Any diagnosis requiring assessment & intervention at least every 4 hours on an ongoing basis to obtain desired patient outcomes including stability and rehabilitation ? Admit to SDU based on following criteria: Respiratory Distress:  Frequent assessment and/or intervention to maintain adequate ventilation/respiration, pulmonary toilet, and respiratory treatment. ? May admit patient to [facility] or [facility] if equivalent level of care is available:: No ? Covid Evaluation: Asymptomatic - no recent exposure (last 10 days) testing not required ? Diagnosis: Acute exacerbation of chronic obstructive pulmonary disease (COPD) ([place]) [854627] ? Admitting Physician: Rhetta Mura [[id number]] ? Attending Physician: Rhetta Mura [[id number]] ? Estimated length of stay: past midnight tomorrow ? Certification:: I certify this patient will need inpatient services for at least 2 midnights ?  ?  ? ?  ? ? ?B ?Medical/Surgery History ?Past Medical History:  ?Diagnosis Date   ? Asthma   ? Bloody stool   ? Dental decay   ? Gastritis, Helicobacter pylori 71/69/6789  ? Hypertension   ? Pneumonia 06/21/2015  ? Prepyloric ulcer 06/22/2015  ? Seasonal allergies   ? Shortness of breath dyspnea   ? with exertion  ? ?Past Surgical History:  ?Procedure Laterality Date  ? BARTHOLIN CYST MARSUPIALIZATION Left 1997  ? [place]  ? Repair of PFO  ? COLONOSCOPY N/A 2002  ? Performed secondary to mother's diagnosis of colon CA at age 37  ? ESOPHAGOGASTRODUODENOSCOPY (EGD) WITH PROPOFOL N/A 06/22/2015  ? Procedure: ESOPHAGOGASTRODUODENOSCOPY (EGD) WITH PROPOFOL;  Surgeon: Arta Silence, MD;  Location: [facility] ENDOSCOPY;  Service: Endoscopy;  Laterality: N/A;  ? FRACTURE SURGERY    ? NASAL SINUS SURGERY Bilateral 1984  ? OVARIAN CYST REMOVAL Left 1990  ? TUBAL LIGATION    ? UMBILICAL HERNIA REPAIR N/A 2005  ?  ? ?A ?IV Location/Drains/Wounds ?Patient Lines/Drains/Airways Status   ? ? Active Line/Drains/Airways   ? ? Name Placement date Placement time Site Days  ? Peripheral IV 10/27/21 20 G 1" Right Antecubital 10/27/21  1918  Antecubital  less than 1  ? Peripheral IV 10/27/21 20 G 1" Left Hand 10/27/21  2026  Hand  less than 1  ? External Urinary Catheter 05/16/21  0441  --  164  ? External Urinary Catheter 10/27/21  2057  --  less than 1  ? ?  ?  ? ?  ? ? ?Intake/Output Last 24 hours ?No intake or output data in the 24 hours ending 10/27/21 2155 ? ?Labs/Imaging ?Results for orders placed or performed  during the hospital encounter of 10/27/21 (from the past 48 hour(s))  ?Resp Panel by RT-PCR (Flu A&B, Covid) Nasopharyngeal Swab     Status: None  ? Collection Time: 10/27/21  7:12 PM  ? Specimen: Nasopharyngeal Swab; Nasopharyngeal(NP) swabs in vial transport medium  ?Result Value Ref Range  ? SARS Coronavirus 2 by RT PCR NEGATIVE NEGATIVE  ?  Comment: (NOTE) ?SARS-CoV-2 target nucleic acids are NOT DETECTED. ? ?The SARS-CoV-2 RNA is generally detectable in upper respiratory ?specimens during  the acute phase of infection. The lowest ?concentration of SARS-CoV-2 viral copies this assay can detect is ?138 copies/mL. A negative result does not preclude SARS-Cov-2 ?infection and should not be used as the sole basis for treatment or ?other patient management decisions. A negative result may occur with  ?improper specimen collection/handling, submission of specimen other ?than nasopharyngeal swab, presence of viral mutation(s) within the ?areas targeted by this assay, and inadequate number of viral ?copies(<138 copies/mL). A negative result must be combined with ?clinical observations, patient history, and epidemiological ?information. The expected result is Negative. ? ?Fact Sheet for Patients:  ?EntrepreneurPulse.com.au ? ?Fact Sheet for Healthcare Providers:  ?IncredibleEmployment.be ? ?This test is no t yet approved or cleared by the [place] FDA and  ?has been authorized for detection and/or diagnosis of SARS-CoV-2 by ?FDA under an Emergency Use Authorization (EUA). This EUA will remain  ?in effect (meaning this test can be used) for the duration of the ?COVID-19 declaration under Section 564(b)(1) of the Act, 21 ?U.S.C.section 360bbb-3(b)(1), unless the authorization is terminated  ?or revoked sooner.  ? ? ?  ? Influenza A by PCR NEGATIVE NEGATIVE  ? Influenza B by PCR NEGATIVE NEGATIVE  ?  Comment: (NOTE) ?The Xpert Xpress SARS-CoV-2/FLU/RSV plus assay is intended as an aid ?in the diagnosis of influenza from Nasopharyngeal swab specimens and ?should not be used as a sole basis for treatment. Nasal washings and ?aspirates are unacceptable for Xpert Xpress SARS-CoV-2/FLU/RSV ?testing. ? ?Fact Sheet for Patients: ?EntrepreneurPulse.com.au ? ?Fact Sheet for Healthcare Providers: ?IncredibleEmployment.be ? ?This test is not yet approved or cleared by the [place] FDA and ?has been authorized for detection and/or diagnosis of  SARS-CoV-2 by ?FDA under an Emergency Use Authorization (EUA). This EUA will remain ?in effect (meaning this test can be used) for the duration of the ?COVID-19 declaration under Section 564(b)(1) of the Act, 21 U.S.C. ?section 360bbb-3(b)(1), unless the authorization is terminated or ?revoked. ? ?Performed at [facility], [address] ?[place], [place] [address] ?  ?Basic metabolic panel     Status: Abnormal  ? Collection Time: 10/27/21  7:25 PM  ?Result Value Ref Range  ? Sodium 139 135 - 145 mmol/L  ? Potassium 2.8 (L) 3.5 - 5.1 mmol/L  ? Chloride 96 (L) 98 - 111 mmol/L  ? CO2 32 22 - 32 mmol/L  ? Glucose, Bld 137 (H) 70 - 99 mg/dL  ?  Comment: Glucose reference range applies only to samples taken after fasting for at least 8 hours.  ? BUN 8 6 - 20 mg/dL  ? Creatinine, Ser 0.55 0.44 - 1.00 mg/dL  ? Calcium 9.6 8.9 - 10.3 mg/dL  ? GFR, Estimated >60 >60 mL/min  ?  Comment: (NOTE) ?Calculated using the CKD-EPI Creatinine Equation (2021) ?  ? Anion gap 11 5 - 15  ?  Comment: Performed at [facility], [place] [address] [place], [place] [address]  ?CBC with Differential     Status: Abnormal  ?  Collection Time: 10/27/21  7:25 PM  ?Result Value Ref Range  ? WBC 13.1 (H) 4.0 - 10.5 K/uL  ? RBC 3.93 3.87 - 5.11 MIL/uL  ? Hemoglobin 10.5 (L) 12.0 - 15.0 g/dL  ? HCT 32.6 (L) 36.0 - 46.0 %  ? MCV 83.0 80.0 - 100.0 fL  ? MCH 26.7 26.0 - 34.0 pg  ? MCHC 32.2 30.0 - 36.0 g/dL  ? RDW 17.7 (H) 11.5 - 15.5 %  ? Platelets 410 (H) 150 - 400 K/uL  ? nRBC 0.0 0.0 - 0.2 %  ? Neutrophils Relative % 65 %  ? Neutro Abs 8.5 (H) 1.7 - 7.7 K/uL  ? Lymphocytes Relative 21 %  ? Lymphs Abs 2.8 0.7 - 4.0 K/uL  ? Monocytes Relative 9 %  ? Monocytes Absolute 1.2 (H) 0.1 - 1.0 K/uL  ? Eosinophils Relative 4 %  ? Eosinophils Absolute 0.5 0.0 - 0.5 K/uL  ? Basophils Relative 1 %  ? Basophils Absolute 0.1 0.0 - 0.1 K/uL  ? Immature Granulocytes 0 %  ? Abs Immature Granulocytes 0.05 0.00 - 0.07 K/uL  ?  Polychromasia PRESENT   ?  Comment: Performed at [facility], [place] [address], [place], [place] [address]  ?Brain natriuretic peptide     Status: Abnormal  ? Collection Time: 10/27/21  7:25 PM  ?

## 2021-10-27 NOTE — Assessment & Plan Note (Addendum)
On multiple antihypertensive meds but not compliant.  Currently normotensive. ?-Cardiac meds per cardiology as above ?

## 2021-10-27 NOTE — Assessment & Plan Note (Addendum)
Seems to have resolved.  See respiratory failure for detail ? ?

## 2021-10-27 NOTE — H&P (Signed)
?History and Physical  ? ? ?PLEASE NOTE THAT [employer]AGON [employer] SOFTWARE WAS USED IN THE CONSTRUCTION OF THIS NOTE. ? ? ?[name] [name] WVP:[id number] DOB: July 12, 1961 DOA: 10/27/2021 ? ?PCP: Kerin Perna, NP  ?Patient coming from: home  ? ?I have personally briefly reviewed patient's old medical records in [place] ? ?Chief Complaint: sob ? ?HPI: [name] [name] is a 61 y.o. female with medical history significant for COPD, paroxysmal atrial fibrillation, essential hypertension, generalized anxiety disorder, chronic anemia with baseline hemoglobin 54-62, chronic diastolic heart failure, who is admitted to [facility] on 10/27/2021 with suspected acute COPD exacerbation after presenting from home to [facility] ED complaining of shortness of breath.  ? ?The patient reports 2 days of progressive shortness of breath associated with new onset nonproductive cough as well as wheezing.  Denies any associated orthopnea or PND.  1 week ago, she noted some mild edema in the bilateral lower extremities, which she states resolved after elevating the bilateral lower extremities, without subsequent return of this peripheral edema.  Not associate with any calf tenderness or new lower extremity erythema. Denies any recent trauma or travel. Not associate with any chest pain, diaphoresis, palpitations, nausea, vomiting, presyncope, or syncope.   ? ?Not associate any subjective fever, chills, rigors, or generalized myalgias.  No recent dysuria or gross hematuria.  Denies any recent abdominal pain, diarrhea, melena or hematochezia. ? ?The patient conveys that she carries a diagnosis of COPD, but also states that she does not will if she has been previously evaluated via pulmonary function testing.  She reports good compliance with her home [facility], noting no recent missed doses thereof.  She also conveys use of a prn albuterol inhaler, noting increasing frequency of use over the last 1 to 2 days, but without any significant  improvement in her respiratory status.  Denies any baseline supplemental oxygen requirements. ? ?She was hospitalized in October 2022 for similar presentation, noted to be in the basis of acute COPD exacerbation, temporarily requiring BiPAP, without intubation.  He is presentation and progressive shortness of breath over the last 2 days and similar to that which she was experiencing at the time of her October 2022 hospitalization. ? ?She also has a documented history of chronic diastolic heart failure, with most recent echocardiogram in August 2022, which was notable for LVEF 60 to 65%, no focal wall motion abnormalities, mild concentric LVH, grade 2 diastolic dysfunction, normal right ventricular systolic function, moderately dilated left atrium, mild mitral vegetation, mild aortic regurgitation.  She conveys no recent changes to her home diuretic regimen which consists of Lasix 40 mg p.o. daily.  She notes good compliance with her Lasix, stating no recent missed doses thereof. ? ?Cardiac history also notable for paroxysmal atrial fibrillation, including the presence of atrial fibrillation noted on EKG from aforementioned October 2022 hospitalization.  She is not currently on chronic anticoagulation.  Additionally, not currently on any AV nodal blocking regimen. ? ? ? ? ?ED Course:  ?Vital signs in the ED were notable for the following: Afebrile; heart rate 95-116; blood pressure 137/71 -  153/71; respiratory rate 22-27; initial oxygen saturation 94% on room air, with subsequent initiation of BiPAP, with ensuing O2 sats noted to be 99 to 100% on BiPAP with settings of 18/8 and 50% FiO2. ? ?Labs were notable for the following: VBG while on BiPAP with the above settings noted to demonstrate the following: 7.4/55.  BMP notable for the following: Sodium 139, potassium 2.8,  carbonate 32, creatinine 0.55.  BNP 174 compared to most recent prior value of 150 in October 2022.  High-sensitivity troponin I noted to be 14.   CBC notable for white blood cell count 13,000 with 65% neutrophils, hemoglobin 10.5 associated normocytic/normochromic findings.  COVID-19/influenza PCR results currently pending. ? ?Imaging and additional notable ED work-up: EKG was performed in the ED, however, interpretation such as limited by significant motion artifact.  We will repeat EKG for further evaluation.  Chest x-ray showed hyperinflation, without any evidence of acute cardiopulmonary process, including no evidence of infiltrate, edema, effusion, or pneumothorax. ? ?While in the ED, the following were administered: Albuterol nebulizer, Solu-Medrol 125 mg IV x1, magnesium sulfate 2 g IV x1, potassium chloride 20 mill colons IV over 2 hours x 1 dose. ? ?Subsequently, the patient was admitted for further evaluation management of suspected acute COPD exacerbation, with presenting labs also notable for hypokalemia. ? ? ? ?Review of Systems: As per HPI otherwise 10 point review of systems negative.  ? ?Past Medical History:  ?Diagnosis Date  ? Asthma   ? Bloody stool   ? Dental decay   ? Gastritis, Helicobacter pylori 67/06/4579  ? Hypertension   ? Pneumonia 06/21/2015  ? Prepyloric ulcer 06/22/2015  ? Seasonal allergies   ? Shortness of breath dyspnea   ? with exertion  ? ? ?Past Surgical History:  ?Procedure Laterality Date  ? BARTHOLIN CYST MARSUPIALIZATION Left 1997  ? [place]  ? Repair of PFO  ? COLONOSCOPY N/A 2002  ? Performed secondary to mother's diagnosis of colon CA at age 40  ? ESOPHAGOGASTRODUODENOSCOPY (EGD) WITH PROPOFOL N/A 06/22/2015  ? Procedure: ESOPHAGOGASTRODUODENOSCOPY (EGD) WITH PROPOFOL;  Surgeon: Arta Silence, MD;  Location: [facility] ENDOSCOPY;  Service: Endoscopy;  Laterality: N/A;  ? FRACTURE SURGERY    ? NASAL SINUS SURGERY Bilateral 1984  ? OVARIAN CYST REMOVAL Left 1990  ? TUBAL LIGATION    ? UMBILICAL HERNIA REPAIR N/A 2005  ? ? ?Social History: ? reports that she has never smoked. She has never used smokeless  tobacco. She reports that she does not currently use alcohol. She reports that she does not use drugs. ? ? ?Allergies  ?Allergen Reactions  ? Tetracyclines & Related Itching  ? ? ?Family History  ?Problem Relation Age of Onset  ? Colon cancer Mother 75  ? Hypertension Mother   ? Hypertension Father   ? Cerebral aneurysm Sister   ? Goiter Sister   ? Breast cancer Neg Hx   ? ? ?Family history reviewed and not pertinent  ? ? ?Prior to Admission medications   ?Medication Sig Start Date End Date Taking? Authorizing Provider  ?albuterol (VENTOLIN HFA) 108 (90 Base) MCG/ACT inhaler INHALE 2 PUFFS INTO THE LUNGS EVERY 6 (SIX) HOURS AS NEEDED FOR WHEEZING OR SHORTNESS OF BREATH. 08/30/21   Kerin Perna, NP  ?amLODipine (NORVASC) 10 MG tablet Take 1 tablet (10 mg total) by mouth daily. 08/22/21   Kerin Perna, NP  ?Blood Pressure Monitor KIT 1 kit by Does not apply route 3 (three) times daily as needed. 09/01/19   Kerin Perna, NP  ?budesonide-formoterol (SYMBICORT) 160-4.5 MCG/ACT inhaler Inhale 2 puffs into the lungs 2 (two) times daily.    [provider]  ?cholecalciferol (VITAMIN D3) 25 MCG (1000 UNIT) tablet Take 1,000 Units by mouth daily.    [provider]  ?diclofenac Sodium (VOLTAREN) 1 % GEL Apply as needed twice a day for knee pain 08/29/21  Kerin Perna, NP  ?FLUoxetine (PROZAC) 20 MG capsule Take 1 capsule (20 mg total) by mouth daily. 03/11/21   Mayers, Cari S, PA-C  ?fluticasone furoate-vilanterol (BREO ELLIPTA) 200-25 MCG/INH AEPB Inhale 1 puff into the lungs daily. 03/28/21   Martyn Ehrich, NP  ?furosemide (LASIX) 40 MG tablet Take 1 tablet (40 mg total) by mouth daily. 03/25/21 03/25/22  Oswald Hillock, MD  ?hydrochlorothiazide (HYDRODIURIL) 25 MG tablet Take 1 tablet (25 mg total) by mouth daily. 08/22/21   Kerin Perna, NP  ?hydrOXYzine (ATARAX/VISTARIL) 25 MG tablet TAKE 1 TABLET (25 MG TOTAL) BY MOUTH EVERY 8 (EIGHT) HOURS AS NEEDED. ?Patient taking  differently: Take 25 mg by mouth every 8 (eight) hours as needed for anxiety. 05/20/21   Kerin Perna, NP  ?loratadine (CLARITIN) 10 MG tablet Take 1 tablet (10 mg total) by mouth daily. ?Patient taking different

## 2021-10-27 NOTE — ED Triage Notes (Signed)
BIBA ?Per EMS: Pt coming from home w/ c/o SHOB. Hx asthma  ?'30mg'$  albuterol today  ?150/70 BP  ?100 HR  ?94% room air  ?Wheezes audible  ?Dyspnea w/ exertion  ?

## 2021-10-27 NOTE — Assessment & Plan Note (Addendum)
Resolved.  Continue monitoring. ? ?

## 2021-10-28 DIAGNOSIS — J441 Chronic obstructive pulmonary disease with (acute) exacerbation: Secondary | ICD-10-CM | POA: Diagnosis not present

## 2021-10-28 DIAGNOSIS — E876 Hypokalemia: Secondary | ICD-10-CM | POA: Diagnosis not present

## 2021-10-28 DIAGNOSIS — I5032 Chronic diastolic (congestive) heart failure: Secondary | ICD-10-CM | POA: Diagnosis not present

## 2021-10-28 DIAGNOSIS — I10 Essential (primary) hypertension: Secondary | ICD-10-CM | POA: Diagnosis not present

## 2021-10-28 LAB — CBC WITH DIFFERENTIAL/PLATELET
Abs Immature Granulocytes: 0.05 10*3/uL (ref 0.00–0.07)
Basophils Absolute: 0 10*3/uL (ref 0.0–0.1)
Basophils Relative: 0 %
Eosinophils Absolute: 0.1 10*3/uL (ref 0.0–0.5)
Eosinophils Relative: 1 %
HCT: 32.1 % — ABNORMAL LOW (ref 36.0–46.0)
Hemoglobin: 10.3 g/dL — ABNORMAL LOW (ref 12.0–15.0)
Immature Granulocytes: 0 %
Lymphocytes Relative: 5 %
Lymphs Abs: 0.7 10*3/uL (ref 0.7–4.0)
MCH: 26.6 pg (ref 26.0–34.0)
MCHC: 32.1 g/dL (ref 30.0–36.0)
MCV: 82.9 fL (ref 80.0–100.0)
Monocytes Absolute: 0.1 10*3/uL (ref 0.1–1.0)
Monocytes Relative: 1 %
Neutro Abs: 12.5 10*3/uL — ABNORMAL HIGH (ref 1.7–7.7)
Neutrophils Relative %: 93 %
Platelets: 325 10*3/uL (ref 150–400)
RBC: 3.87 MIL/uL (ref 3.87–5.11)
RDW: 18 % — ABNORMAL HIGH (ref 11.5–15.5)
WBC: 13.4 10*3/uL — ABNORMAL HIGH (ref 4.0–10.5)
nRBC: 0 % (ref 0.0–0.2)

## 2021-10-28 LAB — COMPREHENSIVE METABOLIC PANEL
ALT: 19 U/L (ref 0–44)
AST: 21 U/L (ref 15–41)
Albumin: 3.9 g/dL (ref 3.5–5.0)
Alkaline Phosphatase: 76 U/L (ref 38–126)
Anion gap: 13 (ref 5–15)
BUN: 9 mg/dL (ref 6–20)
CO2: 28 mmol/L (ref 22–32)
Calcium: 9.5 mg/dL (ref 8.9–10.3)
Chloride: 99 mmol/L (ref 98–111)
Creatinine, Ser: 0.59 mg/dL (ref 0.44–1.00)
GFR, Estimated: >60 mL/min (ref >60)
Glucose, Bld: 176 mg/dL — ABNORMAL HIGH (ref 70–99)
Potassium: 3.4 mmol/L — ABNORMAL LOW (ref 3.5–5.1)
Sodium: 140 mmol/L (ref 135–145)
Total Bilirubin: 0.8 mg/dL (ref 0.3–1.2)
Total Protein: 7.7 g/dL (ref 6.5–8.1)

## 2021-10-28 LAB — BLOOD GAS, VENOUS
Acid-Base Excess: 8.9 mmol/L — ABNORMAL HIGH (ref 0.0–2.0)
Bicarbonate: 34.5 mmol/L — ABNORMAL HIGH (ref 20.0–28.0)
O2 Saturation: 94.2 %
Patient temperature: 36.7
pCO2, Ven: 51 mmHg (ref 44–60)
pH, Ven: 7.43 (ref 7.25–7.43)
pO2, Ven: 62 mmHg — ABNORMAL HIGH (ref 32–45)

## 2021-10-28 LAB — TROPONIN I (HIGH SENSITIVITY): Troponin I (High Sensitivity): 14 ng/L (ref <18)

## 2021-10-28 LAB — MAGNESIUM: Magnesium: 2.3 mg/dL (ref 1.7–2.4)

## 2021-10-28 LAB — PROCALCITONIN: Procalcitonin: <0.1 ng/mL

## 2021-10-28 LAB — PHOSPHORUS: Phosphorus: 4.4 mg/dL (ref 2.5–4.6)

## 2021-10-28 LAB — MRSA NEXT GEN BY PCR, NASAL: MRSA by PCR Next Gen: NOT DETECTED

## 2021-10-28 MED ORDER — AZITHROMYCIN 250 MG PO TABS
500.0000 mg | ORAL_TABLET | Freq: Every day | ORAL | Status: DC
  Administered 2021-10-28 – 2021-10-31 (×4): 500 mg via ORAL
  Filled 2021-10-28 (×4): qty 2

## 2021-10-28 MED ORDER — LORAZEPAM 2 MG/ML IJ SOLN
0.5000 mg | Freq: Once | INTRAMUSCULAR | Status: AC
  Administered 2021-10-28: 0.5 mg via INTRAVENOUS
  Filled 2021-10-28: qty 1

## 2021-10-28 MED ORDER — MOMETASONE FURO-FORMOTEROL FUM 200-5 MCG/ACT IN AERO
2.0000 | INHALATION_SPRAY | Freq: Two times a day (BID) | RESPIRATORY_TRACT | Status: DC
  Administered 2021-10-28 – 2021-10-29 (×3): 2 via RESPIRATORY_TRACT
  Filled 2021-10-28: qty 8.8

## 2021-10-28 MED ORDER — ENOXAPARIN SODIUM 40 MG/0.4ML IJ SOSY
40.0000 mg | PREFILLED_SYRINGE | INTRAMUSCULAR | Status: DC
  Administered 2021-10-28: 40 mg via SUBCUTANEOUS
  Filled 2021-10-28: qty 0.4

## 2021-10-28 MED ORDER — POTASSIUM CHLORIDE CRYS ER 20 MEQ PO TBCR
40.0000 meq | EXTENDED_RELEASE_TABLET | Freq: Every day | ORAL | Status: DC
  Administered 2021-10-28 – 2021-10-30 (×3): 40 meq via ORAL
  Filled 2021-10-28 (×4): qty 2

## 2021-10-28 NOTE — Consult Note (Addendum)
CARDIOLOGY CONSULT NOTE  ?Patient ID: ?[name] [name] ?MRN: [id number] ?DOB/AGE: 09-14-1960 61 y.o. ? ?Admit date: 10/27/2021 ?Referring Physician: [facility] hospitalist ?Reason for Consultation:  Abnormal EKG ? ?HPI:  ? ?61 y.o. African-American female  with hypertension, asthma, HFpEF, ?paroxysmal A-fib, alcohol abuse, chronic anemia, admitted with acute on chronic hypoxic respiratory failure.  Cardiology consulted for management of ventricular ectopy. ? ?Patient lives at home, is on disability due to knee injury, physical activity limited due the same. She was admitted to [facility] hospital on 10/27/2021 with acute worsening of shortness of breath. This has improved with management of acute asthma exacerbation. She denies any chest pain, palpitations. EKG and telemetry showed multiple PAC/PVCs. She was hypokalemic up to 2.8 on admission, now improved to 3.4.  ? ?Patient drinks 2 40s in 4-5 days, knows she has a problem with alcohol. She is a non smoker. 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 No further details available.  ? ?There is noted history of PAF on her chart. Patient is unaware of this. She has never been on anticoagulation. I do not see any EKG or telemetry evidence of Afib.  ? ? ?Past Medical History:  ?Diagnosis Date  ? Asthma   ? Bloody stool   ? Chronic diastolic heart failure ([place])   ? Dental decay   ? Gastritis, Helicobacter pylori 75/17/0017  ? Hypertension   ? Paroxysmal atrial fibrillation ([facility])   ? Pneumonia 06/21/2015  ? Prepyloric ulcer 06/22/2015  ? Seasonal allergies   ? Shortness of breath dyspnea   ? with exertion  ?  ? ?Past Surgical History:  ?Procedure Laterality Date  ? BARTHOLIN CYST MARSUPIALIZATION Left 1997  ? [place]  ? Repair of PFO  ? COLONOSCOPY N/A 2002  ? Performed secondary to mother's diagnosis of colon CA at age 20  ? ESOPHAGOGASTRODUODENOSCOPY (EGD) WITH PROPOFOL N/A 06/22/2015  ? Procedure:  ESOPHAGOGASTRODUODENOSCOPY (EGD) WITH PROPOFOL;  Surgeon: Arta Silence, MD;  Location: [facility] ENDOSCOPY;  Service: Endoscopy;  Laterality: N/A;  ? FRACTURE SURGERY    ? NASAL SINUS SURGERY Bilateral 1984  ? OVARIAN CYST REMOVAL Left 1990  ? TUBAL LIGATION    ? UMBILICAL HERNIA REPAIR N/A 2005  ?  ? ? ?Family History  ?Problem Relation Age of Onset  ? Colon cancer Mother 5  ? Hypertension Mother   ? Hypertension Father   ? Cerebral aneurysm Sister   ? Goiter Sister   ? Breast cancer Neg Hx   ?  ? ?Social History: ?Social History  ? ?Socioeconomic History  ? Marital status: Divorced  ?  Spouse name: Not on file  ? Number of children: 4  ? Years of education: Not on file  ? Highest education level: Not on file  ?Occupational History  ? Occupation: Kitchen/nutrition at [employer]  ?  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  ?Tobacco Use  ? Smoking status: Never  ? Smokeless tobacco: Never  ?Substance and Sexual Activity  ? Alcohol use: Not Currently  ?  Comment: previously 3-4 beers per day  ? Drug use: No  ? Sexual activity: Yes  ?Other Topics Concern  ? Not on file  ?Social History Narrative  ? Born in [place], but grew up in [place]  ? Was homeless Sept. 2016, when initially established  ? Now has own apartment, lives alone  ? Brother and 2 sons live in town, improving  relationships.  ? States ended up homeless due to difficulty with living situation when children moved to [place] to live with her--she ended up leaving to get away from them and did not have a job.    ? Not ready to discuss details  ? ?Social Determinants of Health  ? ?Financial Resource Strain: Not on file  ?Food Insecurity: Not on file  ?Transportation Needs: Not on file  ?Physical Activity: Not on file  ?Stress: Not on file  ?Social Connections: Not on file  ?Intimate Partner Violence: Not on file  ?  ? ?Medications Prior to Admission  ?Medication Sig Dispense Refill Last Dose  ? albuterol (VENTOLIN HFA) 108 (90  Base) MCG/ACT inhaler INHALE 2 PUFFS INTO THE LUNGS EVERY 6 (SIX) HOURS AS NEEDED FOR WHEEZING OR SHORTNESS OF BREATH. 8.5 g 1   ? amLODipine (NORVASC) 10 MG tablet Take 1 tablet (10 mg total) by mouth daily. 90 tablet 1   ? Blood Pressure Monitor KIT 1 kit by Does not apply route 3 (three) times daily as needed. 1 kit 0   ? budesonide-formoterol (SYMBICORT) 160-4.5 MCG/ACT inhaler Inhale 2 puffs into the lungs 2 (two) times daily.     ? cholecalciferol (VITAMIN D3) 25 MCG (1000 UNIT) tablet Take 1,000 Units by mouth daily.     ? diclofenac Sodium (VOLTAREN) 1 % GEL Apply as needed twice a day for knee pain 400 g 0   ? FLUoxetine (PROZAC) 20 MG capsule Take 1 capsule (20 mg total) by mouth daily. 30 capsule 1   ? fluticasone furoate-vilanterol (BREO ELLIPTA) 200-25 MCG/INH AEPB Inhale 1 puff into the lungs daily. 60 each 11   ? furosemide (LASIX) 40 MG tablet Take 1 tablet (40 mg total) by mouth daily. 30 tablet 11   ? hydrochlorothiazide (HYDRODIURIL) 25 MG tablet Take 1 tablet (25 mg total) by mouth daily. 90 tablet 3   ? hydrOXYzine (ATARAX/VISTARIL) 25 MG tablet TAKE 1 TABLET (25 MG TOTAL) BY MOUTH EVERY 8 (EIGHT) HOURS AS NEEDED. (Patient taking differently: Take 25 mg by mouth every 8 (eight) hours as needed for anxiety.) 90 tablet 2   ? loratadine (CLARITIN) 10 MG tablet Take 1 tablet (10 mg total) by mouth daily. (Patient taking differently: Take 10 mg by mouth daily as needed for allergies.) 30 tablet 11   ? potassium chloride SA (KLOR-CON) 20 MEQ tablet Take 1 tablet (20 mEq total) by mouth daily. Take along with lasix 30 tablet 2   ? thiamine 100 MG tablet Take 1 tablet (100 mg total) by mouth daily. 30 tablet 0   ? traZODone (DESYREL) 50 MG tablet Take 1 tablet (50 mg total) by mouth at bedtime. 30 tablet 1   ? valsartan (DIOVAN) 40 MG tablet Take 1 tablet (40 mg total) by mouth daily. 90 tablet 1   ? ? ?Review of Systems  ?Cardiovascular:  Negative for chest pain, dyspnea on exertion, leg swelling,  palpitations and syncope.  ?Respiratory:  Positive for shortness of breath (Now improved).   ?  ? ?Physical Exam: ?Physical Exam ?Vitals and nursing note reviewed.  ?Constitutional:   ?   General: She is not in acute distress. ?Neck:  ?   Vascular: No JVD.  ?Cardiovascular:  ?   Rate and Rhythm: Normal rate and regular rhythm. FrequentExtrasystoles are present. ?   Heart sounds: Normal heart sounds. No murmur heard. ?Pulmonary:  ?   Effort: Pulmonary effort is normal.  ?   Breath sounds: Normal breath  sounds. No wheezing or rales.  ?Musculoskeletal:  ?   Right lower leg: No edema.  ?   Left lower leg: No edema.  ? ? ? ?  ?Imaging/tests reviewed and independently interpreted: ?Lab Results: ?CBC, BMP, HS trop, BNP ? ?Cardiac Studies: ? ?Telemetry 10/28/2021: ?Frequent PAC/PVC's ? ?EKG 10/28/2021: ?Sinus tachycardia with frequent PAC and PVCs ?Right axis deviation ?Nonspecific ST-T abnormality ? ?CXR 10/27/2021: ?No acute abnormality noted. ? ? ?Echocardiogram 03/19/2021: ? 1. Left ventricular ejection fraction, by estimation, is 60 to 65%. The  ?left ventricle has normal function. The left ventricle has no regional  ?wall motion abnormalities. There is moderate concentric left ventricular  ?hypertrophy. Left ventricular diastolic parameters are consistent with  ?Grade II diastolic dysfunction (pseudonormalization). Elevated left atrial pressure.  ? 2. Right ventricular systolic function is normal. The right ventricular  ?size is normal. There is mildly elevated pulmonary artery systolic  ?pressure.  ? 3. Left atrial size was moderately dilated.  ? 4. The mitral valve is normal in structure. Mild mitral valve  ?regurgitation. No evidence of mitral stenosis.  ? 5. The aortic valve is tricuspid. Aortic valve regurgitation is mild. No  ?aortic stenosis is present.  ? 6. The inferior vena cava is dilated in size with <50% respiratory  ?variability, suggesting right atrial pressure of 15 mmHg.  ? ? ? ?Assessment &  Recommendations: ? ? ?61 y.o. African-American female  with hypertension, asthma-pm home oxygen, ?h/o congenital heart surgery, HFpEF, ?paroxysmal A-fib, chronic anemia, admitted with acute on chronic hypoxic respiratory failure.  Cardiology

## 2021-10-28 NOTE — Progress Notes (Signed)
PHARMACIST - PHYSICIAN COMMUNICATION  CONCERNING: Antibiotic IV to Oral Route Change Policy  RECOMMENDATION: This patient is receiving azithromycin by the intravenous route.  Based on criteria approved by the Pharmacy and Therapeutics Committee, the antibiotic(s) is/are being converted to the equivalent oral dose form(s).   DESCRIPTION: These criteria include:  Patient being treated for a respiratory tract infection, urinary tract infection, cellulitis or clostridium difficile associated diarrhea if on metronidazole  The patient is not neutropenic and does not exhibit a GI malabsorption state  The patient is eating (either orally or via tube) and/or has been taking other orally administered medications for a least 24 hours  The patient is improving clinically and has a Tmax < 100.5  If you have questions about this conversion, please contact the Pharmacy Department  []  ( [phone] )  [place] []  ( [phone] )  [facility] []  ( [phone] )  [place] []  ( [phone] )  [facility] [x]  ( [phone] )  [facility]  

## 2021-10-28 NOTE — Progress Notes (Signed)
Patient unable to tolerate third run of potassium. Requested PO K but provider does not want patient removing BIPAP until morning. PIV K restarted but I am having to infuse it at 72m/hr because PT was still having burning even when rate was cut in half.  ? ?IV anxiety medication requested since patient can not take her normal PO med. Appears to be effective for now. Will continue to assess. ?

## 2021-10-28 NOTE — Progress Notes (Signed)
Pt placed back on bipap

## 2021-10-28 NOTE — Progress Notes (Addendum)
?PROGRESS NOTE ? ? ? ?[name] [name]  PNT:[id number] DOB: [birth year] DOA: 10/27/2021 ?PCP: Kerin Perna, NP  ? ?Brief Narrative:  ?61 year old female with history of COPD, chronic respiratory failure with hypoxia paroxysmal A-fib, essential hypertension, generalized anxiety disorder, chronic anemia, chronic diastolic heart failure presented with worsening shortness of breath.  On presentation, she was significantly hypoxic requiring BiPAP.  Potassium was 2.8, WBCs of 13.  Chest x-ray showed no infiltrates.  She was started on IV Solu-Medrol and nebs. ? ?Assessment & Plan: ?  ?Acute on chronic hypoxic respiratory failure ?COPD exacerbation ?-Chest x-ray negative for infiltrates ?-COVID-19/influenza negative ?-Wears oxygen via nasal cannula at 2 L/min required BiPAP on presentation.  Currently on 3 L oxygen via nasal cannula. ?-Continue Solu-Medrol 80 mg IV every 12 hours.  Continue nebs.  Add Dulera ? ?Paroxysmal A-fib ?Telemetry showing various rhythms including ectopy/PVCs/bigeminy ?-Currently intermittently tachycardic.  Not on anticoagulation.  Cardiology has been consulted/Dr. Terri Skains.  Follow recommendations ? ?Chronic diastolic heart failure ?-Currently compensated.  Echo on 03/19/2021 showed EF of 60 to 65% with grade 2 diastolic dysfunction.  Continue strict input and output.  Daily weights.  Fluid restriction.  Continue Lasix and irbesartan.  Follow cardiology recommendations. ? ?Leukocytosis ?-Possibly reactive.  Monitor ? ?Essential hypertension ?-Blood pressure on the higher side.  Continue amlodipine, Lasix, irbesartan.  Hydrochlorothiazide on hold. ? ?Anemia of chronic disease ?-From chronic illnesses.  Hemoglobin stable.  Monitor ? ?Thrombocytosis ?-Resolved ? ?Hypokalemia ?-Replace.  Repeat a.m. labs ? ?Morbid obesity ?-Outpatient follow-up ? ?DVT prophylaxis: Start Solu-Medrol ?Code Status: Full ?Family Communication: None at bedside ?Disposition Plan: ?Status is: Inpatient ?Remains inpatient  appropriate because: Of severity of illness ? ? ? ?Consultants: Cardiology ? ?Procedures: None ? ?Antimicrobials: None ? ? ?Subjective: ?Patient seen and examined at bedside.  Breathing is slightly better but still short of breath with exertion.  No overnight fever, nausea, vomiting or chest pain reported. ? ?Objective: ?Vitals:  ? 10/28/21 0810 10/28/21 0842 10/28/21 1029 10/28/21 1100  ?BP:   (!) 159/87   ?Pulse:      ?Resp:  (!) 25    ?Temp:    98.4 ?F (36.9 ?C)  ?TempSrc:    Oral  ?SpO2: 99% 96%    ?Weight:      ?Height:      ? ? ?Intake/Output Summary (Last 24 hours) at 10/28/2021 1139 ?Last data filed at 10/28/2021 0756 ?Gross per 24 hour  ?Intake 369.94 ml  ?Output 200 ml  ?Net 169.94 ml  ? ?Filed Weights  ? 10/27/21 2300 10/28/21 0500  ?Weight: 105.1 kg 105.1 kg  ? ? ?Examination: ? ?General exam: Appears calm and comfortable.  Currently on 3 L oxygen via nasal cannula ?Respiratory system: Bilateral decreased breath sounds at bases with some scattered crackles  ?cardiovascular system: S1 & S2 heard, Rate controlled ?Gastrointestinal system: Abdomen is morbidly obese, nondistended, soft and nontender. Normal bowel sounds heard. ?Extremities: No cyanosis, clubbing; trace lower extremity edema present  ?Central nervous system: Alert and oriented. No focal neurological deficits. Moving extremities ?Skin: No rashes, lesions or ulcers ?Psychiatry: Affect is mostly flat.  No signs of agitation. ? ? ?Data Reviewed: I have personally reviewed following labs and imaging studies ? ?CBC: ?Recent Labs  ?Lab 10/27/21 ?1925 10/28/21 ?0020  ?WBC 13.1* 13.4*  ?NEUTROABS 8.5* 12.5*  ?HGB 10.5* 10.3*  ?HCT 32.6* 32.1*  ?MCV 83.0 82.9  ?PLT 410* 325  ? ?Basic Metabolic Panel: ?Recent Labs  ?Lab 10/27/21 ?1925 10/27/21 ?2220 10/28/21 ?0020  ?  NA 139  --  140  ?K 2.8*  --  3.4*  ?CL 96*  --  99  ?CO2 32  --  28  ?GLUCOSE 137*  --  176*  ?BUN 8  --  9  ?CREATININE 0.55  --  0.59  ?CALCIUM 9.6  --  9.5  ?MG  --  1.6* 2.3  ?PHOS  --    --  4.4  ? ?GFR: ?Estimated Creatinine Clearance: 86.8 mL/min (by C-G formula based on SCr of 0.59 mg/dL). ?Liver Function Tests: ?Recent Labs  ?Lab 10/28/21 ?0020  ?AST 21  ?ALT 19  ?ALKPHOS 76  ?BILITOT 0.8  ?PROT 7.7  ?ALBUMIN 3.9  ? ?No results for input(s): LIPASE, AMYLASE in the last 168 hours. ?No results for input(s): AMMONIA in the last 168 hours. ?Coagulation Profile: ?No results for input(s): INR, PROTIME in the last 168 hours. ?Cardiac Enzymes: ?No results for input(s): CKTOTAL, CKMB, CKMBINDEX, TROPONINI in the last 168 hours. ?BNP (last 3 results) ?No results for input(s): PROBNP in the last 8760 hours. ?HbA1C: ?No results for input(s): HGBA1C in the last 72 hours. ?CBG: ?No results for input(s): GLUCAP in the last 168 hours. ?Lipid Profile: ?No results for input(s): CHOL, HDL, LDLCALC, TRIG, CHOLHDL, LDLDIRECT in the last 72 hours. ?Thyroid Function Tests: ?No results for input(s): TSH, T4TOTAL, FREET4, T3FREE, THYROIDAB in the last 72 hours. ?Anemia Panel: ?No results for input(s): VITAMINB12, FOLATE, FERRITIN, TIBC, IRON, RETICCTPCT in the last 72 hours. ?Sepsis Labs: ?Recent Labs  ?Lab 10/27/21 ?2218  ?PROCALCITON <0.10  ? ? ?Recent Results (from the past 240 hour(s))  ?Resp Panel by RT-PCR (Flu A&B, Covid) Nasopharyngeal Swab     Status: None  ? Collection Time: 10/27/21  7:12 PM  ? Specimen: Nasopharyngeal Swab; Nasopharyngeal(NP) swabs in vial transport medium  ?Result Value Ref Range Status  ? SARS Coronavirus 2 by RT PCR NEGATIVE NEGATIVE Final  ?  Comment: (NOTE) ?SARS-CoV-2 target nucleic acids are NOT DETECTED. ? ?The SARS-CoV-2 RNA is generally detectable in upper respiratory ?specimens during the acute phase of infection. The lowest ?concentration of SARS-CoV-2 viral copies this assay can detect is ?138 copies/mL. A negative result does not preclude SARS-Cov-2 ?infection and should not be used as the sole basis for treatment or ?other patient management decisions. A negative result may  occur with  ?improper specimen collection/handling, submission of specimen other ?than nasopharyngeal swab, presence of viral mutation(s) within the ?areas targeted by this assay, and inadequate number of viral ?copies(<138 copies/mL). A negative result must be combined with ?clinical observations, patient history, and epidemiological ?information. The expected result is Negative. ? ?Fact Sheet for Patients:  ?EntrepreneurPulse.com.au ? ?Fact Sheet for Healthcare Providers:  ?IncredibleEmployment.be ? ?This test is no t yet approved or cleared by the [place] FDA and  ?has been authorized for detection and/or diagnosis of SARS-CoV-2 by ?FDA under an Emergency Use Authorization (EUA). This EUA will remain  ?in effect (meaning this test can be used) for the duration of the ?COVID-19 declaration under Section 564(b)(1) of the Act, 21 ?U.S.C.section 360bbb-3(b)(1), unless the authorization is terminated  ?or revoked sooner.  ? ? ?  ? Influenza A by PCR NEGATIVE NEGATIVE Final  ? Influenza B by PCR NEGATIVE NEGATIVE Final  ?  Comment: (NOTE) ?The Xpert Xpress SARS-CoV-2/FLU/RSV plus assay is intended as an aid ?in the diagnosis of influenza from Nasopharyngeal swab specimens and ?should not be used as a sole basis for treatment. Nasal washings and ?aspirates are unacceptable  for Xpert Xpress SARS-CoV-2/FLU/RSV ?testing. ? ?Fact Sheet for Patients: ?EntrepreneurPulse.com.au ? ?Fact Sheet for Healthcare Providers: ?IncredibleEmployment.be ? ?This test is not yet approved or cleared by the [place] FDA and ?has been authorized for detection and/or diagnosis of SARS-CoV-2 by ?FDA under an Emergency Use Authorization (EUA). This EUA will remain ?in effect (meaning this test can be used) for the duration of the ?COVID-19 declaration under Section 564(b)(1) of the Act, 21 U.S.C. ?section 360bbb-3(b)(1), unless the authorization is terminated  or ?revoked. ? ?Performed at [facility], [address] ?[place], [place] [address] ?  ?MRSA Next Gen by PCR, Nasal     Status: None  ? Collection Time: 10/28/21  8:36 AM  ? Specimen: Nasal Muco

## 2021-10-29 ENCOUNTER — Inpatient Hospital Stay (HOSPITAL_COMMUNITY): Payer: Medicare Other

## 2021-10-29 ENCOUNTER — Telehealth: Payer: Self-pay | Admitting: Physician Assistant

## 2021-10-29 DIAGNOSIS — R0689 Other abnormalities of breathing: Secondary | ICD-10-CM

## 2021-10-29 DIAGNOSIS — J45901 Unspecified asthma with (acute) exacerbation: Secondary | ICD-10-CM

## 2021-10-29 DIAGNOSIS — I480 Paroxysmal atrial fibrillation: Secondary | ICD-10-CM | POA: Diagnosis not present

## 2021-10-29 DIAGNOSIS — J441 Chronic obstructive pulmonary disease with (acute) exacerbation: Secondary | ICD-10-CM | POA: Diagnosis not present

## 2021-10-29 DIAGNOSIS — I5032 Chronic diastolic (congestive) heart failure: Secondary | ICD-10-CM | POA: Diagnosis not present

## 2021-10-29 LAB — BASIC METABOLIC PANEL
Anion gap: 10 (ref 5–15)
BUN: 19 mg/dL (ref 6–20)
CO2: 27 mmol/L (ref 22–32)
Calcium: 9.6 mg/dL (ref 8.9–10.3)
Chloride: 100 mmol/L (ref 98–111)
Creatinine, Ser: 0.61 mg/dL (ref 0.44–1.00)
GFR, Estimated: >60 mL/min (ref >60)
Glucose, Bld: 203 mg/dL — ABNORMAL HIGH (ref 70–99)
Potassium: 4 mmol/L (ref 3.5–5.1)
Sodium: 137 mmol/L (ref 135–145)

## 2021-10-29 LAB — HEPARIN LEVEL (UNFRACTIONATED)
Heparin Unfractionated: 0.2 IU/mL — ABNORMAL LOW (ref 0.30–0.70)
Heparin Unfractionated: 0.26 IU/mL — ABNORMAL LOW (ref 0.30–0.70)

## 2021-10-29 LAB — MAGNESIUM: Magnesium: 2 mg/dL (ref 1.7–2.4)

## 2021-10-29 LAB — TSH: TSH: 0.339 u[IU]/mL — ABNORMAL LOW (ref 0.350–4.500)

## 2021-10-29 LAB — TROPONIN I (HIGH SENSITIVITY): Troponin I (High Sensitivity): 8 ng/L (ref <18)

## 2021-10-29 MED ORDER — NITROGLYCERIN 0.4 MG SL SUBL: 0.4000 mg | SUBLINGUAL_TABLET | SUBLINGUAL | Status: DC | PRN

## 2021-10-29 MED ORDER — SODIUM CHLORIDE (PF) 0.9 % IJ SOLN
INTRAMUSCULAR | Status: AC
Start: 2021-10-29 — End: 2021-10-29
  Filled 2021-10-29: qty 50

## 2021-10-29 MED ORDER — ARFORMOTEROL TARTRATE 15 MCG/2ML IN NEBU
15.0000 ug | INHALATION_SOLUTION | Freq: Two times a day (BID) | RESPIRATORY_TRACT | Status: DC
  Administered 2021-10-29 – 2021-11-05 (×14): 15 ug via RESPIRATORY_TRACT
  Filled 2021-10-29 (×14): qty 2

## 2021-10-29 MED ORDER — DILTIAZEM HCL-DEXTROSE 125-5 MG/125ML-% IV SOLN (PREMIX)
5.0000 mg/h | INTRAVENOUS | Status: DC
  Administered 2021-10-29: 5 mg/h via INTRAVENOUS
  Administered 2021-10-30 – 2021-10-31 (×4): 15 mg/h via INTRAVENOUS
  Filled 2021-10-29 (×5): qty 125

## 2021-10-29 MED ORDER — DIGOXIN 0.25 MG/ML IJ SOLN
0.2500 mg | Freq: Once | INTRAMUSCULAR | Status: AC
  Administered 2021-10-29: 0.25 mg via INTRAVENOUS
  Filled 2021-10-29: qty 2

## 2021-10-29 MED ORDER — SODIUM CHLORIDE (PF) 0.9 % IJ SOLN
INTRAMUSCULAR | Status: AC
Start: 2021-10-29 — End: 2021-10-30
  Filled 2021-10-29: qty 50

## 2021-10-29 MED ORDER — DILTIAZEM LOAD VIA INFUSION
10.0000 mg | Freq: Once | INTRAVENOUS | Status: AC
  Administered 2021-10-29: 10 mg via INTRAVENOUS
  Filled 2021-10-29: qty 10

## 2021-10-29 MED ORDER — LORAZEPAM 2 MG/ML IJ SOLN
0.5000 mg | Freq: Once | INTRAMUSCULAR | Status: AC
  Administered 2021-10-29: 0.5 mg via INTRAVENOUS
  Filled 2021-10-29: qty 1

## 2021-10-29 MED ORDER — DIGOXIN 0.25 MG/ML IJ SOLN
0.1250 mg | Freq: Once | INTRAMUSCULAR | Status: AC
  Administered 2021-10-29: 0.125 mg via INTRAVENOUS
  Filled 2021-10-29: qty 2

## 2021-10-29 MED ORDER — AMIODARONE HCL IN DEXTROSE 360-4.14 MG/200ML-% IV SOLN
30.0000 mg/h | INTRAVENOUS | Status: DC
  Administered 2021-10-29 – 2021-10-31 (×5): 30 mg/h via INTRAVENOUS
  Filled 2021-10-29 (×5): qty 200

## 2021-10-29 MED ORDER — AMIODARONE LOAD VIA INFUSION
150.0000 mg | Freq: Once | INTRAVENOUS | Status: AC
Start: 2021-10-29 — End: 2021-10-29
  Administered 2021-10-29: 150 mg via INTRAVENOUS
  Filled 2021-10-29: qty 83.34

## 2021-10-29 MED ORDER — AMIODARONE HCL IN DEXTROSE 360-4.14 MG/200ML-% IV SOLN
60.0000 mg/h | INTRAVENOUS | Status: AC
Start: 2021-10-29 — End: 2021-10-29
  Administered 2021-10-29 (×2): 60 mg/h via INTRAVENOUS
  Filled 2021-10-29: qty 200

## 2021-10-29 MED ORDER — HEPARIN BOLUS VIA INFUSION
4000.0000 [IU] | Freq: Once | INTRAVENOUS | Status: AC
  Administered 2021-10-29: 4000 [IU] via INTRAVENOUS
  Filled 2021-10-29: qty 4000

## 2021-10-29 MED ORDER — BUDESONIDE 0.5 MG/2ML IN SUSP
0.5000 mg | Freq: Two times a day (BID) | RESPIRATORY_TRACT | Status: DC
  Administered 2021-10-29 – 2021-11-05 (×14): 0.5 mg via RESPIRATORY_TRACT
  Filled 2021-10-29 (×12): qty 2

## 2021-10-29 MED ORDER — HEPARIN (PORCINE) 25000 UT/250ML-% IV SOLN
1500.0000 [IU]/h | INTRAVENOUS | Status: AC
  Administered 2021-10-29: 1250 [IU]/h via INTRAVENOUS
  Administered 2021-10-30: 1500 [IU]/h via INTRAVENOUS
  Administered 2021-10-30: 1400 [IU]/h via INTRAVENOUS
  Administered 2021-10-31 – 2021-11-01 (×2): 1500 [IU]/h via INTRAVENOUS
  Filled 2021-10-29 (×6): qty 250

## 2021-10-29 MED ORDER — IOHEXOL 350 MG/ML SOLN
100.0000 mL | Freq: Once | INTRAVENOUS | Status: AC | PRN
  Administered 2021-10-29: 80 mL via INTRAVENOUS

## 2021-10-29 MED ORDER — REVEFENACIN 175 MCG/3ML IN SOLN
175.0000 ug | Freq: Every day | RESPIRATORY_TRACT | Status: DC
  Administered 2021-10-29 – 2021-11-05 (×5): 175 ug via RESPIRATORY_TRACT
  Filled 2021-10-29 (×8): qty 3

## 2021-10-29 MED ORDER — LACTATED RINGERS IV BOLUS
250.0000 mL | Freq: Once | INTRAVENOUS | Status: AC
Start: 2021-10-29 — End: 2021-10-29
  Administered 2021-10-29: 250 mL via INTRAVENOUS

## 2021-10-29 NOTE — Progress Notes (Signed)
?PROGRESS NOTE ? ? ? ?[name] [name]  JSH:[id number] DOB: 1961/02/04 DOA: 10/27/2021 ?PCP: Kerin Perna, NP  ? ?Brief Narrative:  ?61 year old female with history of COPD, chronic respiratory failure with hypoxia paroxysmal A-fib, essential hypertension, generalized anxiety disorder, chronic anemia, chronic diastolic heart failure presented with worsening shortness of breath.  On presentation, she was significantly hypoxic requiring BiPAP.  Potassium was 2.8, WBCs of 13.  Chest x-ray showed no infiltrates.  She was started on IV Solu-Medrol and nebs.  Cardiology was consulted for abnormal telemetry rhythms. ? ?Assessment & Plan: ?  ?Acute on chronic hypoxic respiratory failure ?COPD exacerbation ?-Chest x-ray negative for infiltrates ?-COVID-19/influenza negative ?-Wears oxygen via nasal cannula at 2 L/min required BiPAP on presentation.   ?-Required BiPAP overnight night ?-Currently on 2 L oxygen via nasal cannula ?-Continue Solu-Medrol 80 mg IV every 12 hours.  Continue nebs and Dulera ? ?Paroxysmal A-fib with RVR ?Telemetry showing various rhythms including ectopy/PVCs/bigeminy ?-Cardiology had seen her on 10/28/2021 and added metoprolol.  Patient went into A-fib with RVR with hypotension overnight on 10/28/2021 and has been started on amiodarone drip.  Will ask cardiology to reevaluate the patient. ? ?Chronic diastolic heart failure ?-Currently compensated.  Echo on 03/19/2021 showed EF of 60 to 65% with grade 2 diastolic dysfunction.  Continue strict input and output.  Daily weights.  Fluid restriction.  Continue Lasix and irbesartan.  ? ?Leukocytosis ?-Possibly reactive.  Monitor ? ?Essential hypertension ?-Blood pressure on the lower side.  Hold amlodipine, Lasix and irbesartan as well today.  Hydrochlorothiazide on hold. ? ?Anemia of chronic disease ?-From chronic illnesses.  Hemoglobin stable.  Monitor ? ?Thrombocytosis ?-Resolved ? ?Hypokalemia ?-Improved. ? ?Morbid obesity ?-Outpatient follow-up ? ?DVT  prophylaxis: Lovenox  ?code Status: Full ?Family Communication: None at bedside ?Disposition Plan: ?Status is: Inpatient ?Remains inpatient appropriate because: Of severity of illness ? ? ? ?Consultants: Cardiology ? ?Procedures: None ? ?Antimicrobials: None ? ? ?Subjective: ?Patient seen and examined at bedside.  Still short of breath with minimal exertion but feels slightly better.  Denies any current chest pain, palpitations, nausea or vomiting. ?Objective: ?Vitals:  ? 10/29/21 0530 10/29/21 0545 10/29/21 0600 10/29/21 0604  ?BP: (!) 118/95 (!) 79/57  (!) 125/92  ?Pulse:      ?Resp: '19 20 19 17  '$ ?Temp:      ?TempSrc:      ?SpO2:      ?Weight:      ?Height:      ? ? ?Intake/Output Summary (Last 24 hours) at 10/29/2021 0749 ?Last data filed at 10/29/2021 0615 ?Gross per 24 hour  ?Intake 513.16 ml  ?Output 400 ml  ?Net 113.16 ml  ? ? ?Filed Weights  ? 10/27/21 2300 10/28/21 0500 10/29/21 0500  ?Weight: 105.1 kg 105.1 kg 107 kg  ? ? ?Examination: ? ?General exam: No distress.  On 2 L oxygen by nasal cannula.   ?Respiratory system: Decreased breath sounds at bases bilaterally with scattered crackles ? cardiovascular system: Intermittently tachycardic; S1-S2 heard  ?gastrointestinal system: Abdomen is morbidly obese, distended slightly; soft and nontender.  Bowel sounds are heard  ?extremities: Mild lower extremity edema present; no clubbing ?Central nervous system: Awake and alert.  No focal neurological deficits.  Moves extremities  ?skin: No obvious ecchymosis/lesions  ?psychiatry: Currently not agitated.  Mostly flat affect ? ? ?Data Reviewed: I have personally reviewed following labs and imaging studies ? ?CBC: ?Recent Labs  ?Lab 10/27/21 ?1925 10/28/21 ?0020  ?WBC 13.1* 13.4*  ?NEUTROABS 8.5* 12.5*  ?  HGB 10.5* 10.3*  ?HCT 32.6* 32.1*  ?MCV 83.0 82.9  ?PLT 410* 325  ? ? ?Basic Metabolic Panel: ?Recent Labs  ?Lab 10/27/21 ?1925 10/27/21 ?2220 10/28/21 ?0020 10/29/21 ?0226  ?NA 139  --  140 137  ?K 2.8*  --  3.4* 4.0   ?CL 96*  --  99 100  ?CO2 32  --  28 27  ?GLUCOSE 137*  --  176* 203*  ?BUN 8  --  9 19  ?CREATININE 0.55  --  0.59 0.61  ?CALCIUM 9.6  --  9.5 9.6  ?MG  --  1.6* 2.3 2.0  ?PHOS  --   --  4.4  --   ? ? ?GFR: ?Estimated Creatinine Clearance: 87.6 mL/min (by C-G formula based on SCr of 0.61 mg/dL). ?Liver Function Tests: ?Recent Labs  ?Lab 10/28/21 ?0020  ?AST 21  ?ALT 19  ?ALKPHOS 76  ?BILITOT 0.8  ?PROT 7.7  ?ALBUMIN 3.9  ? ? ?No results for input(s): LIPASE, AMYLASE in the last 168 hours. ?No results for input(s): AMMONIA in the last 168 hours. ?Coagulation Profile: ?No results for input(s): INR, PROTIME in the last 168 hours. ?Cardiac Enzymes: ?No results for input(s): CKTOTAL, CKMB, CKMBINDEX, TROPONINI in the last 168 hours. ?BNP (last 3 results) ?No results for input(s): PROBNP in the last 8760 hours. ?HbA1C: ?No results for input(s): HGBA1C in the last 72 hours. ?CBG: ?No results for input(s): GLUCAP in the last 168 hours. ?Lipid Profile: ?No results for input(s): CHOL, HDL, LDLCALC, TRIG, CHOLHDL, LDLDIRECT in the last 72 hours. ?Thyroid Function Tests: ?No results for input(s): TSH, T4TOTAL, FREET4, T3FREE, THYROIDAB in the last 72 hours. ?Anemia Panel: ?No results for input(s): VITAMINB12, FOLATE, FERRITIN, TIBC, IRON, RETICCTPCT in the last 72 hours. ?Sepsis Labs: ?Recent Labs  ?Lab 10/27/21 ?2218  ?PROCALCITON <0.10  ? ? ? ?Recent Results (from the past 240 hour(s))  ?Resp Panel by RT-PCR (Flu A&B, Covid) Nasopharyngeal Swab     Status: None  ? Collection Time: 10/27/21  7:12 PM  ? Specimen: Nasopharyngeal Swab; Nasopharyngeal(NP) swabs in vial transport medium  ?Result Value Ref Range Status  ? SARS Coronavirus 2 by RT PCR NEGATIVE NEGATIVE Final  ?  Comment: (NOTE) ?SARS-CoV-2 target nucleic acids are NOT DETECTED. ? ?The SARS-CoV-2 RNA is generally detectable in upper respiratory ?specimens during the acute phase of infection. The lowest ?concentration of SARS-CoV-2 viral copies this assay can detect  is ?138 copies/mL. A negative result does not preclude SARS-Cov-2 ?infection and should not be used as the sole basis for treatment or ?other patient management decisions. A negative result may occur with  ?improper specimen collection/handling, submission of specimen other ?than nasopharyngeal swab, presence of viral mutation(s) within the ?areas targeted by this assay, and inadequate number of viral ?copies(<138 copies/mL). A negative result must be combined with ?clinical observations, patient history, and epidemiological ?information. The expected result is Negative. ? ?Fact Sheet for Patients:  ?EntrepreneurPulse.com.au ? ?Fact Sheet for Healthcare Providers:  ?IncredibleEmployment.be ? ?This test is no t yet approved or cleared by the [place] FDA and  ?has been authorized for detection and/or diagnosis of SARS-CoV-2 by ?FDA under an Emergency Use Authorization (EUA). This EUA will remain  ?in effect (meaning this test can be used) for the duration of the ?COVID-19 declaration under Section 564(b)(1) of the Act, 21 ?U.S.C.section 360bbb-3(b)(1), unless the authorization is terminated  ?or revoked sooner.  ? ? ?  ? Influenza A by PCR NEGATIVE NEGATIVE Final  ?  Influenza B by PCR NEGATIVE NEGATIVE Final  ?  Comment: (NOTE) ?The Xpert Xpress SARS-CoV-2/FLU/RSV plus assay is intended as an aid ?in the diagnosis of influenza from Nasopharyngeal swab specimens and ?should not be used as a sole basis for treatment. Nasal washings and ?aspirates are unacceptable for Xpert Xpress SARS-CoV-2/FLU/RSV ?testing. ? ?Fact Sheet for Patients: ?EntrepreneurPulse.com.au ? ?Fact Sheet for Healthcare Providers: ?IncredibleEmployment.be ? ?This test is not yet approved or cleared by the [place] FDA and ?has been authorized for detection and/or diagnosis of SARS-CoV-2 by ?FDA under an Emergency Use Authorization (EUA). This EUA will remain ?in effect  (meaning this test can be used) for the duration of the ?COVID-19 declaration under Section 564(b)(1) of the Act, 21 U.S.C. ?section 360bbb-3(b)(1), unless the authorization is terminated or ?revoked. ? ?

## 2021-10-29 NOTE — Progress Notes (Signed)
Progress Note ? ?Patient Name: [name] [name] ?Date of Encounter: 10/29/2021 ? ?Attending physician: Aline August, MD ?Primary care provider: Kerin Perna, NP ?Primary Cardiologist: NA ? ?Subjective: ?[name] [name] is a 61 y.o. female who was seen and examined at bedside  ?Resting in bed comfortably. ?Asymptomatic. ?Currently in A-fib with RVR. ?Started on amiodarone overnight.  For reasons unknown IV heparin was not initiated. ?Has a soft blood pressures. ?Had morning breakfast ?Case discussed and reviewed with her nurse. ? ?Objective: ?Vital Signs in the last 24 hours: ?Temp:  [97.2 ?F (36.2 ?C)-98.8 ?F (37.1 ?C)] 97.5 ?F (36.4 ?C) (04/11 0800) ?Pulse Rate:  [36-144] 128 (04/11 0700) ?Resp:  [11-32] 21 (04/11 0815) ?BP: (79-168)/(57-116) 129/80 (04/11 0815) ?SpO2:  [80 %-100 %] 99 % (04/11 0815) ?Weight:  [107 kg] 107 kg (04/11 0500) ? ?Intake/Output: ? ?Intake/Output Summary (Last 24 hours) at 10/29/2021 1012 ?Last data filed at 10/29/2021 4132 ?Gross per 24 hour  ?Intake 873.16 ml  ?Output 200 ml  ?Net 673.16 ml  ?  ?Net IO Since Admission: 843.1 mL [10/29/21 1012] ? ?Weights:  ?Filed Weights  ? 10/27/21 2300 10/28/21 0500 10/29/21 0500  ?Weight: 105.1 kg 105.1 kg 107 kg  ? ? ?Telemetry: Personally reviewed.  A-fib with RVR ? ?Physical examination: ?PHYSICAL EXAM: ? ?  10/29/2021  ?  8:15 AM 10/29/2021  ?  8:00 AM 10/29/2021  ?  7:00 AM  ?Vitals with BMI  ?Systolic 440 102 725  ?Diastolic 80 97 92  ?Pulse   128  ? ? ?CONSTITUTIONAL: Age-appropriate female, hemodynamically stable, no acute distress.   ?SKIN: Skin is warm and dry. No rash noted. No cyanosis. No pallor. No jaundice ?HEAD: Normocephalic and atraumatic.  ?EYES: No scleral icterus ?MOUTH/THROAT: Moist oral membranes.  ?NECK: No JVD present. No thyromegaly noted. No carotid bruits  ?LYMPHATIC: No visible cervical adenopathy.  ?CHEST Normal respiratory effort. No intercostal retractions  ?LUNGS: Clear to auscultation bilaterally. ?CARDIOVASCULAR:  Tachycardic, irregularly irregular, variable S1-S2, no murmurs rubs or gallops appreciated secondary to tachycardia.  ?ABDOMINAL: Soft, nontender, nondistended, positive bowel sounds in all 4 quadrants, no apparent ascites.  ?EXTREMITIES: No pitting edema, warm to touch, 2+ bilateral DP and PT pulses ?HEMATOLOGIC: No significant bruising ?NEUROLOGIC: Oriented to person, place, and time. Nonfocal. Normal muscle tone.  ?PSYCHIATRIC: Normal mood and affect. Normal behavior. Cooperative ? ?Lab Results: ?Hematology ?Recent Labs  ?Lab 10/27/21 ?1925 10/28/21 ?0020  ?WBC 13.1* 13.4*  ?RBC 3.93 3.87  ?HGB 10.5* 10.3*  ?HCT 32.6* 32.1*  ?MCV 83.0 82.9  ?MCH 26.7 26.6  ?MCHC 32.2 32.1  ?RDW 17.7* 18.0*  ?PLT 410* 325  ? ? ?Chemistry ?Recent Labs  ?Lab 10/27/21 ?1925 10/28/21 ?0020 10/29/21 ?0226  ?NA 139 140 137  ?K 2.8* 3.4* 4.0  ?CL 96* 99 100  ?CO2 32 28 27  ?GLUCOSE 137* 176* 203*  ?BUN '8 9 19  '$ ?CREATININE 0.55 0.59 0.61  ?CALCIUM 9.6 9.5 9.6  ?PROT  --  7.7  --   ?ALBUMIN  --  3.9  --   ?AST  --  21  --   ?ALT  --  19  --   ?ALKPHOS  --  76  --   ?BILITOT  --  0.8  --   ?GFRNONAA >60 >60 >60  ?ANIONGAP '11 13 10  '$ ?  ? ?Cardiac Enzymes: ?Cardiac Panel (last 3 results) ?Recent Labs  ?  10/27/21 ?1925 10/28/21 ?0020  ?TROPONINIHS 14 14  ? ? ?BNP (last 3  results) ?Recent Labs  ?  03/22/21 ?0300 05/14/21 ?0654 10/27/21 ?1925  ?BNP 109.3* 150.6* 173.6*  ? ? ?ProBNP (last 3 results) ?No results for input(s): PROBNP in the last 8760 hours. ? ? ?DDimer No results for input(s): DDIMER in the last 168 hours.  ? ?Hemoglobin A1c:  ?Lab Results  ?Component Value Date  ? HGBA1C 6.0 (H) 05/15/2021  ? MPG 125.5 05/15/2021  ? ? ?TSH  ?Recent Labs  ?  03/12/21 ?0901  ?TSH CANCELED  ? ? ?Lipid Panel  ?   ?Component Value Date/Time  ? CHOL 181 08/22/2021 1143  ? TRIG 118 08/22/2021 1143  ? HDL 58 08/22/2021 1143  ? CHOLHDL 3.1 08/22/2021 1143  ? CHOLHDL 2.4 03/15/2010 0447  ? VLDL 12 03/15/2010 0447  ? LDLCALC 102 (H) 08/22/2021 1143   ? ? ?Imaging: ?DG Chest Port 1 View ? ?Result Date: 10/27/2021 ?CLINICAL DATA:  Shortness of breath EXAM: PORTABLE CHEST 1 VIEW COMPARISON:  05/14/2021 FINDINGS: Cardiac shadow is stable. Lungs are hyperinflated but clear. No focal infiltrate or effusion is noted. No bony abnormality is seen. IMPRESSION: No acute abnormality noted. Electronically Signed   By: Inez Catalina M.D.   On: 10/27/2021 19:42   ? ?CARDIAC DATABASE: ?EKG: ?10/27/2021: Sinus tachycardia, 116 bpm, frequent PVCs. ? ?10/29/2021: A-fib with RVR, 162 bpm. ? ?Echocardiogram: ?03/19/2021: LVEF 60-65%, moderate LVH, grade 2 diastolic dysfunction, elevated LAP, moderately dilated left atrium, mild MR, mild AR, estimated RAP 15 mmHg. ? ?Stress test: ?None ? ?Heart catheterization: ?None ? ?Scheduled Meds: ? amLODipine  10 mg Oral Daily  ? azithromycin  500 mg Oral QHS  ? chlorhexidine  15 mL Mouth Rinse BID  ? Chlorhexidine Gluconate Cloth  6 each Topical Daily  ? digoxin  0.25 mg Intravenous Once  ? Followed by  ? digoxin  0.125 mg Intravenous Once  ? Followed by  ? digoxin  0.125 mg Intravenous Once  ? furosemide  40 mg Oral Daily  ? ipratropium  0.5 mg Nebulization Q6H  ? irbesartan  75 mg Oral Daily  ? levalbuterol  1.25 mg Nebulization Q6H  ? mouth rinse  15 mL Mouth Rinse q12n4p  ? methylPREDNISolone (SOLU-MEDROL) injection  80 mg Intravenous Q12H  ? mometasone-formoterol  2 puff Inhalation BID  ? potassium chloride SA  40 mEq Oral Daily  ? ? ?Continuous Infusions: ? amiodarone 60 mg/hr (10/29/21 0809)  ? Followed by  ? amiodarone    ? heparin 1,250 Units/hr (10/29/21 1008)  ? ? ?PRN Meds: ?acetaminophen **OR** acetaminophen, hydrOXYzine, levalbuterol, metoprolol tartrate  ? ?IMPRESSION & RECOMMENDATIONS: ?[name] [name] is a 61 y.o. African-American female whose past medical history and cardiac risk factors include: Hypertension, asthma, HFpEF, paroxysmal atrial fibrillation, chronic anemia, COPD, alcohol abuse, postmenopausal female. ? ?Atrial  fibrillation with rapid ventricular rate: ?Onset: Late yesterday night early this morning ?Currently asymptomatic despite soft blood pressures. ?Started on amiodarone drip yesterday night. ?Ventricular rate remains elevated between 130-160 bpm along with soft blood pressures. ?Was started on Lopressor yesterday -nursing staff asked to hold Lopressor if her SBP is less than 100 mmHg or heart rate less than 60 bpm. ?Start digoxin.  250 mcg x 1 now, 125 mcg x2 6 hours apart.  ?No prior history of gastrointestinal or intracranial bleeding.  Start IV heparin drip for thromboembolic prophylaxis.  CHA2DS2-VASc SCORE is 3 which correlates to 3.2 % risk of stroke per year (history of HFpEF, HTN, gender). ?Despite her rapid ventricular rate patient remains asymptomatic.  Therefore the shared decision was to continue with up titration of medical therapy.  Of note, unable to undergo TEE guided cardioversion today as she had breakfast this morning and no availability in endoscopy.  She scheduled tentatively for TEE guided cardioversion tomorrow at noon. ? ?After careful review of history and examination, the risks, benefits of transesophageal echocardiogram, and alternatives have been explained to the patient. Complications include but not limited to esophageal perforation (rare), gastrointestinal bleeding (rare), cardiac arrhythmia which can include cardiac arrest (requiring cardiopulmonary resuscitation) and death (rare), pharyngeal irritation / discomfort with swallowing / hematoma, methemoglobinemia, bronchospasm, transient hypoxia, nonsustained ventricular tachycardia, transient atrial fibrillation, minimal hemoptysis, vomiting, hypotension, respiratory compromise, reaction to medications, unavoidable damage to teeth and gums, aspiration pneumonia  were reviewed with the patient.  Patient voices understands, provides verbal feedback, questions answered, and patient  wishes to proceed with the procedure. ? ?Risks, benefits,  and alternatives of direct current cardioversion reviewed with the and patient. Risk includes but not limited to: potential for post-cardioversion rhythms, life-threatening arrhythmias (ventricular tachycardia and fibril

## 2021-10-29 NOTE — Progress Notes (Addendum)
Pharmacy: [facility] ? ?Patient is a 61 y.o F currently on heparin drip for afib with RVR.  Plan for possible DCCV on 10/30/21. ? ?- first heparin level collected at 4:31 PM  is sub-therapeutic at 0.20. However, pt's RN reported that drip was off for ~30 min (1:30p to 2p) for chest CT.  ?- chest CT: no evidence of pulmonary embolism through the segmental pulmonary arterial level ? ? ?Goal of Therapy:  ?Heparin level 0.3-0.7 units/ml ?Monitor platelets by anticoagulation protocol: Yes ? ?Plan: ?- Will continue heparin drip at current rate of 1250 units/hr for now ?- recheck heparin level at 7:30p and adjust rate if needed ?- monitor for s/sx bleeding ? ?Dia Sitter, PharmD, BCPS ?10/29/2021 6:04 PM ? ?_______________________________ ? ?Adden: heparin level collected at ~7p is 0.26. Per pt's RN, no issues with IV line and no bleeding noted. ?- will increase rate to 1400 units/hr ?- check 6 hr heparin level ? ?Dia Sitter, PharmD, BCPS ?10/29/2021 7:55 PM ? ?

## 2021-10-29 NOTE — Progress Notes (Signed)
Per pt request. Please do not camera into room unless emergency.  ?

## 2021-10-29 NOTE — Plan of Care (Signed)
?  Problem: Education: ?Goal: Knowledge of General Education information will improve ?Description: Including pain rating scale, medication(s)/side effects and non-pharmacologic comfort measures ?Outcome: Progressing ?  ?Problem: Health Behavior/Discharge Planning: ?Goal: Ability to manage health-related needs will improve ?Outcome: Progressing ?  ?Problem: Cardiac: ?Goal: Ability to achieve and maintain adequate cardiopulmonary perfusion will improve ?Outcome: Progressing ?  ?

## 2021-10-29 NOTE — Progress Notes (Signed)
ANTICOAGULATION CONSULT NOTE - Initial Consult ? ?Pharmacy Consult for heparin ?Indication: atrial fibrillation ? ?Allergies  ?Allergen Reactions  ? Tetracyclines & Related Itching  ? ? ?Patient Measurements: ?Height: _0  (160 cm) ?Weight: 107 kg (235 lb 14.3 oz) ?IBW/kg (Calculated) : 52.4 ?Heparin Dosing Weight: 77.4 kg ? ?Vital Signs: ?Temp: 97.5 ?F (36.4 ?C) (04/11 0800) ?Temp Source: Oral (04/11 0800) ?BP: 129/80 (04/11 0815) ?Pulse Rate: 128 (04/11 0700) ? ?Labs: ?Recent Labs  ?  10/27/21 ?1925 10/28/21 ?0020 10/29/21 ?0226  ?HGB 10.5* 10.3*  --   ?HCT 32.6* 32.1*  --   ?PLT 410* 325  --   ?CREATININE 0.55 0.59 0.61  ?TROPONINIHS 14 14  --   ? ? ?Estimated Creatinine Clearance: 87.6 mL/min (by C-G formula based on SCr of 0.61 mg/dL). ? ? ?Medical History: ?Past Medical History:  ?Diagnosis Date  ? Asthma   ? Bloody stool   ? Chronic diastolic heart failure ([place])   ? Dental decay   ? Gastritis, Helicobacter pylori 71/69/6789  ? Hypertension   ? Paroxysmal atrial fibrillation ([facility])   ? Pneumonia 06/21/2015  ? Prepyloric ulcer 06/22/2015  ? Seasonal allergies   ? Shortness of breath dyspnea   ? with exertion  ? ? ?Medications:  ?Medications Prior to Admission  ?Medication Sig Dispense Refill Last Dose  ? OXYGEN Inhale 2 L/min into the lungs.     ? albuterol (VENTOLIN HFA) 108 (90 Base) MCG/ACT inhaler INHALE 2 PUFFS INTO THE LUNGS EVERY 6 (SIX) HOURS AS NEEDED FOR WHEEZING OR SHORTNESS OF BREATH. 8.5 g 1   ? amLODipine (NORVASC) 10 MG tablet Take 1 tablet (10 mg total) by mouth daily. 90 tablet 1   ? Blood Pressure Monitor KIT 1 kit by Does not apply route 3 (three) times daily as needed. 1 kit 0   ? budesonide-formoterol (SYMBICORT) 160-4.5 MCG/ACT inhaler Inhale 2 puffs into the lungs 2 (two) times daily.     ? cholecalciferol (VITAMIN D3) 25 MCG (1000 UNIT) tablet Take 1,000 Units by mouth daily.     ? diclofenac Sodium (VOLTAREN) 1 % GEL Apply as needed twice a day for knee pain 400 g 0   ? FLUoxetine  (PROZAC) 20 MG capsule Take 1 capsule (20 mg total) by mouth daily. 30 capsule 1   ? fluticasone furoate-vilanterol (BREO ELLIPTA) 200-25 MCG/INH AEPB Inhale 1 puff into the lungs daily. 60 each 11   ? furosemide (LASIX) 40 MG tablet Take 1 tablet (40 mg total) by mouth daily. 30 tablet 11   ? hydrochlorothiazide (HYDRODIURIL) 25 MG tablet Take 1 tablet (25 mg total) by mouth daily. 90 tablet 3   ? hydrOXYzine (ATARAX/VISTARIL) 25 MG tablet TAKE 1 TABLET (25 MG TOTAL) BY MOUTH EVERY 8 (EIGHT) HOURS AS NEEDED. (Patient taking differently: Take 25 mg by mouth every 8 (eight) hours as needed for anxiety.) 90 tablet 2   ? loratadine (CLARITIN) 10 MG tablet Take 1 tablet (10 mg total) by mouth daily. (Patient taking differently: Take 10 mg by mouth daily as needed for allergies.) 30 tablet 11   ? potassium chloride SA (KLOR-CON) 20 MEQ tablet Take 1 tablet (20 mEq total) by mouth daily. Take along with lasix 30 tablet 2   ? thiamine 100 MG tablet Take 1 tablet (100 mg total) by mouth daily. 30 tablet 0   ? traZODone (DESYREL) 50 MG tablet Take 1 tablet (50 mg total) by mouth at bedtime. 30 tablet 1   ? valsartan (DIOVAN)  40 MG tablet Take 1 tablet (40 mg total) by mouth daily. 90 tablet 1   ? ? ?Assessment: ?Pharmacy consulted to dose heparin for atrial fibrillation.  No anticoagulants PTA. ?Hg 10.3, PLT WNL.  No bleeding reported. ? ?Goal of Therapy:  ?Heparin level 0.3-0.7 units/ml ?Monitor platelets by anticoagulation protocol: Yes ?  ?Plan:  ?Heparin bolus 4000 units IV x 1 ?Heparin drip 1250 units/hr ?Check heparin level 6 hours after bolus ?Daily heparin level & CBC ? ? ?Eudelia Bunch, Pharm.D ?10/29/2021 9:38 AM ? ? ?Leodis Sias T ?10/29/2021,9:34 AM ? ? ?

## 2021-10-29 NOTE — Consult Note (Signed)
? ?NAME:  [name] [name], MRN:  [id number], DOB:  1961-02-05, LOS: 2 ?ADMISSION DATE:  10/27/2021, CONSULTATION DATE:  10/29/21 ?REFERRING MD:  Starla Link, CHIEF COMPLAINT:   shortness of breath ? ?History of Present Illness:  ?[name] [name] is a 61 y.o. F with PMH of asthma and likely OSA/OHS,  ETOH abuse, atrial fibrillation, HTN, GAD, HFpEF who presented to the ED with shortness of breath on 4/9.     She complained of two days of shortness of breath and wheezing with mild cough and peripheral edema without orthopnea.  She had no associated chest pain or leg pain.   She was hospitalized in 04/2021 for shortness of breath which was thought to be a possible COPD exacerbation and was treated with Bipap.   She followed up with [facility] [facility] and recommended a sleep study and PFT's, thought to likely have mild intermittent asthma, required 2L home O2.   When evaluated today, pt states that she does not want to talk, so history taken from [place].   Per notes she has never been a smoker or formally diagnosed with COPD. ? ? ?This admission, pt was started on Azithromycin, Solumedrol and xopanex. Her heart rhythm irregular with PVC's, but rate controlled and was admitted to the hospitalist service.  On 4/10 rhythm changed to Afib with RVR and she was started on Amiodarone.  On 4/11 she had some increased dyspnea and PCCM consulted.  ? ?Pertinent  Medical History  ? has a past medical history of Asthma, Bloody stool, Chronic diastolic heart failure ([place]), Dental decay, Gastritis, Helicobacter pylori (50/53/9767), Hypertension, Paroxysmal atrial fibrillation ([place]), Pneumonia (06/21/2015), Prepyloric ulcer (06/22/2015), Seasonal allergies, and Shortness of breath dyspnea. ? ? ?Significant Hospital Events: ?Including procedures, antibiotic start and stop dates in addition to other pertinent events   ?4/9 admitted with shortness of breath and PVC's ?4/10 Developed Afib with RVR, started amiodarone and then Digoxin along with heparin  gtt ?4/11 Episode of dyspnea, on 3L [place] O2 then Bipap, PCCM consult ? ?Significant tests: ? ?CTA chest: ?1. Examination for pulmonary embolism is somewhat limited by breath ?motion artifact, particularly in the lung bases. Within this ?limitation, no evidence of pulmonary embolism through the segmental ?pulmonary arterial level. ?2. Cardiomegaly. ?3. Enlargement of the main pulmonary artery, as can be seen in ?pulmonary hypertension. ?4. 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 Recommend dedicated CT ?angiogram of the abdomen and pelvis to further evaluate on a ?nonemergent basis, when clinically appropriate. ? ? ?Micro: ?4/10 MRSA negative ?4/10 Covid and Flu negative ? ? ? ?Abx: ?4/9 Azithromycin ? ?Interim History / Subjective:  ?At the time of assessment with tachypneic, but saturating well on [place], appeared upset and did not want to discuss anything ? ?Objective   ?Blood pressure 129/80, pulse (!) 128, temperature (!) 97.5 ?F (36.4 ?C), temperature source Oral, resp. rate (!) 21, height '5\' 3"'$  (1.6 m), weight 107 kg, SpO2 99 %. ?   ?   ? ?Intake/Output Summary (Last 24 hours) at 10/29/2021 1219 ?Last data filed at 10/29/2021 3419 ?Gross per 24 hour  ?Intake 873.16 ml  ?Output 200 ml  ?Net 673.16 ml  ? ?Filed Weights  ? 10/27/21 2300 10/28/21 0500 10/29/21 0500  ?Weight: 105.1 kg 105.1 kg 107 kg  ? ?General:  ill-appearing F, well nourished, uncomfortable appearing, but not in severe distress ?HEENT: MM pink/moist ?Neuro: awake,  states she does not want to talk, following commands ?CV: s1s2 tachycardic, irregular, no m/r/g ?PULM:  mild expiratory wheeze bilateral bases, tachypneic, no accessory muscle use on 3L ?GI: soft, non-tender ?Extremities: warm/dry, no edema  ?Skin: no rashes or lesions ? ? ?Resolved Hospital Problem list   ? ? ?Assessment & Plan:  ? ? ?Acute on Chronic Hypoxic  respiratory failure ?Likely baseline Asthma with undiagnosed OSA/OHS with mild flare and dyspnea worsened with Afib RVR ?-continue Azithromycin, solumedrol, start Pulmicort, Brovana, and Yupelri nebs ?-Bipap prn  ?-No PE on CTA chest ?-needs outpatient pulmonology follow up, will request through Epic ? ? ?Atrial Fibrillation with RVR ?-cardiology managing, on Digoxin and heparin, plan for TEE guided cardioversion 4/12 ? ? ? ?Issues Managed by primary team: ?Chronic ETOH abuse ?HTN ?Chronic Anemia  ?Hypokalemia ? ? ? ? ? ?Best Practice (right click and "Reselect all SmartList Selections" daily)  ? ?Per primary ? ?Labs   ?CBC: ?Recent Labs  ?Lab 10/27/21 ?1925 10/28/21 ?0020  ?WBC 13.1* 13.4*  ?NEUTROABS 8.5* 12.5*  ?HGB 10.5* 10.3*  ?HCT 32.6* 32.1*  ?MCV 83.0 82.9  ?PLT 410* 325  ? ? ?Basic Metabolic Panel: ?Recent Labs  ?Lab 10/27/21 ?1925 10/27/21 ?2220 10/28/21 ?0020 10/29/21 ?0226  ?NA 139  --  140 137  ?K 2.8*  --  3.4* 4.0  ?CL 96*  --  99 100  ?CO2 32  --  28 27  ?GLUCOSE 137*  --  176* 203*  ?BUN 8  --  9 19  ?CREATININE 0.55  --  0.59 0.61  ?CALCIUM 9.6  --  9.5 9.6  ?MG  --  1.6* 2.3 2.0  ?PHOS  --   --  4.4  --   ? ?GFR: ?Estimated Creatinine Clearance: 87.6 mL/min (by C-G formula based on SCr of 0.61 mg/dL). ?Recent Labs  ?Lab 10/27/21 ?1925 10/27/21 ?2218 10/28/21 ?0020  ?PROCALCITON  --  <0.10  --   ?WBC 13.1*  --  13.4*  ? ? ?Liver Function Tests: ?Recent Labs  ?Lab 10/28/21 ?0020  ?AST 21  ?ALT 19  ?ALKPHOS 76  ?BILITOT 0.8  ?PROT 7.7  ?ALBUMIN 3.9  ? ?No results for input(s): LIPASE, AMYLASE in the last 168 hours. ?No results for input(s): AMMONIA in the last 168 hours. ? ?ABG ?   ?Component Value Date/Time  ? PHART 7.510 (H) 05/15/2021 0544  ? PCO2ART 32.7 05/15/2021 0544  ? PO2ART 76.4 (L) 05/15/2021 0544  ? HCO3 34.5 (H) 10/28/2021 0020  ? TCO2 28 01/01/2011 1532  ? O2SAT 94.2 10/28/2021 0020  ?  ? ?Coagulation Profile: ?No results for input(s): INR, PROTIME in the last 168 hours. ? ?Cardiac  Enzymes: ?No results for input(s): CKTOTAL, CKMB, CKMBINDEX, TROPONINI in the last 168 hours. ? ?HbA1C: ?Hgb A1c MFr Bld  ?Date/Time Value Ref Range Status  ?05/15/2021 04:38 AM 6.0 (H) 4.8 - 5.6 % Final  ?  Comment:  ?  (NOTE) ?Pre diabetes:          5.7%-6.4% ? ?Diabetes:              >6.4% ? ?Glycemic control for   <7.0% ?adults with diabetes ?  ?03/19/2021 05:26 AM 5.8 (H) 4.8 - 5.6 % Final  ?  Comment:  ?  (NOTE) ?        Prediabetes: 5.7 - 6.4 ?        Diabetes: >6.4 ?        Glycemic control for adults with diabetes: <  7.0 ?  ? ? ?CBG: ?No results for input(s): GLUCAP in the last 168 hours. ? ?Review of Systems:   ?Pt refused conversation, unable to obtain ? ?Past Medical History:  ?She,  has a past medical history of Asthma, Bloody stool, Chronic diastolic heart failure ([place]), Dental decay, Gastritis, Helicobacter pylori (41/74/0814), Hypertension, Paroxysmal atrial fibrillation ([place]), Pneumonia (06/21/2015), Prepyloric ulcer (06/22/2015), Seasonal allergies, and Shortness of breath dyspnea.  ? ?Surgical History:  ? ?Past Surgical History:  ?Procedure Laterality Date  ? BARTHOLIN CYST MARSUPIALIZATION Left 1997  ? [place]  ? Repair of PFO  ? COLONOSCOPY N/A 2002  ? Performed secondary to mother's diagnosis of colon CA at age 78  ? ESOPHAGOGASTRODUODENOSCOPY (EGD) WITH PROPOFOL N/A 06/22/2015  ? Procedure: ESOPHAGOGASTRODUODENOSCOPY (EGD) WITH PROPOFOL;  Surgeon: Arta Silence, MD;  Location: [facility] ENDOSCOPY;  Service: Endoscopy;  Laterality: N/A;  ? FRACTURE SURGERY    ? NASAL SINUS SURGERY Bilateral 1984  ? OVARIAN CYST REMOVAL Left 1990  ? TUBAL LIGATION    ? UMBILICAL HERNIA REPAIR N/A 2005  ?  ? ?Social History:  ? reports that she has never smoked. She has never used smokeless tobacco. She reports that she does not currently use alcohol. She reports that she does not use drugs.  ? ?Family History:  ?Her family history includes Cerebral aneurysm in her sister; Colon cancer (age of onset: 14)  in her mother; Goiter in her sister; Hypertension in her father and mother. There is no history of Breast cancer.  ? ?Allergies ?Allergies  ?Allergen Reactions  ? Tetracyclines & Related Itching  ?  ? ?Home M

## 2021-10-29 NOTE — Progress Notes (Signed)
ON-CALL CARDIOLOGY ?10/29/21 ? ?Patient's name: [name] [name].   ?MRN: [id number].    ?DOB: [birth year] ?Primary care provider: Kerin Perna, NP. ? ?Interaction regarding this patient's care today: ?Spoke to her daughter Conchetta over the phone at approximately 6:30 PM. ? ?Updated her from cardiovascular standpoint as her mother has been in A-fib with rapid ventricular rate since approximately 1:30 AM 10/29/2021.  ? ?In the morning her systolic blood pressures were soft and therefore she was continued on amiodarone drip, loaded with IV digoxin, and antihypertensive medications held. ? ?This shared decision was to continue with parenteral medications to see if she was converted back to normal sinus rhythm as long as she remains hemodynamically stable.  However, if she remains in A-fib with RVR this shared decision with the patient during morning rounds was to proceed with transesophageal guided cardioversion. ? ?I discussed the risks, benefits and alternatives to TEE guided cardioversion as discussed during this morning's office visit.  Her daughter is agreeable with the current plan of care but would like an update in the morning. ? ?Spoke to the patient's nurse over the phone she remains in A-fib with RVR blood pressures under improved and remains asymptomatic.  This shared decision was to start Cardizem drip to help improve her ventricular rate and titrate the Cardizem drip per protocol. ? ?Impression: ?Atrial fibrillation with rapid ventricular rate ? ? ?Recommendations: ?Start Cardizem 10 mg IV push over 3 minutes followed by Cardizem drip. ?Continue amiodarone drip. ?Uptitrate Cardizem drip as tolerated based on blood pressures and hemodynamics. ? ?No charge. ? ?Rex Kras, DO, FACC ? ?Pager: [phone] ?Office: [phone] ? ?

## 2021-10-29 NOTE — Progress Notes (Addendum)
? ? ?  OVERNIGHT PROGRESS REPORT ? ?Notified by RN for Heart rate varying from 135-170 bpm. ?On arrival to bedside patient is in no obvious or stated distress and shows A-fib in the aforementioned range. ? ?12 lead to be obtained and a small IVF bolus (266m) with the next due lopressor was given with slight increase in BP but little to no affect on HR. ? ?Potassium returns at 4.0 mmol/L. ?Magnesium returns at 2.0 mg/dL. ? ? ?Amiodarone is started at this time. ? ?Update 0502 hrs: ?Patient remains asymptomatic. ?HR has slowed to the 110-135 bpm range with considerably less variation/ectopy. ?BP has improved greatly to 119 SBP w MAPs >65 ? ? ?JGershon CullMSNA MSN ACNPC-AG ?Acute Care Nurse Practitioner ?[facility] ?[place]? ? ? ?

## 2021-10-29 NOTE — Plan of Care (Signed)
Went into afib/RVR and was unresolved with Metoprolol doses, Ativan in case of anxiety, and 274m LR bolus. Therefore, Amiodarone IV bolus and infusion started. Also noted that patient's blood pressure seems to be lower when she lays on right side and normalizes when lying on back. Patient denied shortness of breath, palpitations, or pain throughout the night. ? ? ?Problem: Education: ?Goal: Knowledge of General Education information will improve ?Description: Including pain rating scale, medication(s)/side effects and non-pharmacologic comfort measures ?Outcome: Progressing ?  ?Problem: Health Behavior/Discharge Planning: ?Goal: Ability to manage health-related needs will improve ?Outcome: Progressing ?  ?Problem: Clinical Measurements: ?Goal: Ability to maintain clinical measurements within normal limits will improve ?Outcome: Progressing ?Goal: Will remain free from infection ?Outcome: Progressing ?Goal: Diagnostic test results will improve ?Outcome: Progressing ?Goal: Respiratory complications will improve ?Outcome: Progressing ?Goal: Cardiovascular complication will be avoided ?Outcome: Progressing ?  ?Problem: Activity: ?Goal: Risk for activity intolerance will decrease ?Outcome: Progressing ?  ?Problem: Nutrition: ?Goal: Adequate nutrition will be maintained ?Outcome: Progressing ?  ?Problem: Coping: ?Goal: Level of anxiety will decrease ?Outcome: Progressing ?  ?Problem: Elimination: ?Goal: Will not experience complications related to bowel motility ?Outcome: Progressing ?Goal: Will not experience complications related to urinary retention ?Outcome: Progressing ?  ?Problem: Pain Managment: ?Goal: General experience of comfort will improve ?Outcome: Progressing ?  ?Problem: Safety: ?Goal: Ability to remain free from injury will improve ?Outcome: Progressing ?  ?Problem: Skin Integrity: ?Goal: Risk for impaired skin integrity will decrease ?Outcome: Progressing ?  ?Problem: Education: ?Goal: Knowledge of  disease or condition will improve ?Outcome: Progressing ?Goal: Knowledge of the prescribed therapeutic regimen will improve ?Outcome: Progressing ?Goal: Individualized Educational Video(s) ?Outcome: Progressing ?  ?Problem: Activity: ?Goal: Ability to tolerate increased activity will improve ?Outcome: Progressing ?Goal: Will verbalize the importance of balancing activity with adequate rest periods ?Outcome: Progressing ?  ?Problem: Respiratory: ?Goal: Ability to maintain a clear airway will improve ?Outcome: Progressing ?Goal: Levels of oxygenation will improve ?Outcome: Progressing ?Goal: Ability to maintain adequate ventilation will improve ?Outcome: Progressing ?  ?

## 2021-10-29 NOTE — Plan of Care (Signed)

## 2021-10-29 NOTE — Progress Notes (Signed)
Pt placed on bipap for the night. °

## 2021-10-30 ENCOUNTER — Other Ambulatory Visit (HOSPITAL_COMMUNITY): Payer: Medicare Other

## 2021-10-30 DIAGNOSIS — F1010 Alcohol abuse, uncomplicated: Secondary | ICD-10-CM

## 2021-10-30 DIAGNOSIS — D500 Iron deficiency anemia secondary to blood loss (chronic): Secondary | ICD-10-CM

## 2021-10-30 DIAGNOSIS — F418 Other specified anxiety disorders: Secondary | ICD-10-CM

## 2021-10-30 DIAGNOSIS — I480 Paroxysmal atrial fibrillation: Secondary | ICD-10-CM | POA: Diagnosis not present

## 2021-10-30 DIAGNOSIS — E0781 Sick-euthyroid syndrome: Secondary | ICD-10-CM

## 2021-10-30 DIAGNOSIS — R55 Syncope and collapse: Secondary | ICD-10-CM | POA: Diagnosis not present

## 2021-10-30 DIAGNOSIS — R1084 Generalized abdominal pain: Secondary | ICD-10-CM

## 2021-10-30 DIAGNOSIS — J45901 Unspecified asthma with (acute) exacerbation: Secondary | ICD-10-CM | POA: Diagnosis not present

## 2021-10-30 DIAGNOSIS — R739 Hyperglycemia, unspecified: Secondary | ICD-10-CM

## 2021-10-30 DIAGNOSIS — J9621 Acute and chronic respiratory failure with hypoxia: Secondary | ICD-10-CM

## 2021-10-30 DIAGNOSIS — D649 Anemia, unspecified: Secondary | ICD-10-CM | POA: Diagnosis not present

## 2021-10-30 LAB — IRON AND TIBC
Iron: 17 ug/dL — ABNORMAL LOW (ref 28–170)
Saturation Ratios: 3 % — ABNORMAL LOW (ref 10.4–31.8)
TIBC: 549 ug/dL — ABNORMAL HIGH (ref 250–450)
UIBC: 532 ug/dL

## 2021-10-30 LAB — CBC
HCT: 28.8 % — ABNORMAL LOW (ref 36.0–46.0)
HCT: 35.7 % — ABNORMAL LOW (ref 36.0–46.0)
Hemoglobin: 9 g/dL — ABNORMAL LOW (ref 12.0–15.0)
Hemoglobin: 11.2 g/dL — ABNORMAL LOW (ref 12.0–15.0)
MCH: 25.8 pg — ABNORMAL LOW (ref 26.0–34.0)
MCH: 25.9 pg — ABNORMAL LOW (ref 26.0–34.0)
MCHC: 31.3 g/dL (ref 30.0–36.0)
MCHC: 31.4 g/dL (ref 30.0–36.0)
MCV: 82.5 fL (ref 80.0–100.0)
MCV: 82.6 fL (ref 80.0–100.0)
Platelets: 304 10*3/uL (ref 150–400)
Platelets: 449 10*3/uL — ABNORMAL HIGH (ref 150–400)
RBC: 3.49 MIL/uL — ABNORMAL LOW (ref 3.87–5.11)
RBC: 4.32 MIL/uL (ref 3.87–5.11)
RDW: 18.2 % — ABNORMAL HIGH (ref 11.5–15.5)
RDW: 18.3 % — ABNORMAL HIGH (ref 11.5–15.5)
WBC: 13.6 10*3/uL — ABNORMAL HIGH (ref 4.0–10.5)
WBC: 19 10*3/uL — ABNORMAL HIGH (ref 4.0–10.5)
nRBC: 0 % (ref 0.0–0.2)
nRBC: 0.3 % — ABNORMAL HIGH (ref 0.0–0.2)

## 2021-10-30 LAB — RETICULOCYTES
Immature Retic Fract: 30.5 % — ABNORMAL HIGH (ref 2.3–15.9)
RBC.: 3.51 MIL/uL — ABNORMAL LOW (ref 3.87–5.11)
Retic Count, Absolute: 100.4 10*3/uL (ref 19.0–186.0)
Retic Ct Pct: 2.9 % (ref 0.4–3.1)

## 2021-10-30 LAB — BASIC METABOLIC PANEL
Anion gap: 8 (ref 5–15)
BUN: 19 mg/dL (ref 6–20)
CO2: 27 mmol/L (ref 22–32)
Calcium: 9.1 mg/dL (ref 8.9–10.3)
Chloride: 101 mmol/L (ref 98–111)
Creatinine, Ser: 0.69 mg/dL (ref 0.44–1.00)
GFR, Estimated: >60 mL/min (ref >60)
Glucose, Bld: 234 mg/dL — ABNORMAL HIGH (ref 70–99)
Potassium: 4.5 mmol/L (ref 3.5–5.1)
Sodium: 136 mmol/L (ref 135–145)

## 2021-10-30 LAB — MAGNESIUM: Magnesium: 2 mg/dL (ref 1.7–2.4)

## 2021-10-30 LAB — LACTIC ACID, PLASMA
Lactic Acid, Venous: 4.9 mmol/L (ref 0.5–1.9)
Lactic Acid, Venous: 4.8 mmol/L (ref 0.5–1.9)

## 2021-10-30 LAB — HEMOGLOBIN A1C
Hgb A1c MFr Bld: 5.6 % (ref 4.8–5.6)
Mean Plasma Glucose: 114.02 mg/dL

## 2021-10-30 LAB — VITAMIN B12: Vitamin B-12: 386 pg/mL (ref 180–914)

## 2021-10-30 LAB — HEPARIN LEVEL (UNFRACTIONATED)
Heparin Unfractionated: 0.29 IU/mL — ABNORMAL LOW (ref 0.30–0.70)
Heparin Unfractionated: 0.46 IU/mL (ref 0.30–0.70)
Heparin Unfractionated: 0.49 IU/mL (ref 0.30–0.70)

## 2021-10-30 LAB — FOLATE: Folate: 9.8 ng/mL (ref >5.9)

## 2021-10-30 LAB — FERRITIN: Ferritin: 11 ng/mL (ref 11–307)

## 2021-10-30 LAB — OCCULT BLOOD X 1 CARD TO LAB, STOOL: Fecal Occult Bld: NEGATIVE

## 2021-10-30 LAB — T4, FREE: Free T4: 0.84 ng/dL (ref 0.61–1.12)

## 2021-10-30 MED ORDER — LORAZEPAM 1 MG PO TABS: 1.0000 mg | ORAL_TABLET | ORAL | Status: DC | PRN

## 2021-10-30 MED ORDER — LEVALBUTEROL HCL 1.25 MG/0.5ML IN NEBU
1.2500 mg | INHALATION_SOLUTION | Freq: Four times a day (QID) | RESPIRATORY_TRACT | Status: DC
  Administered 2021-10-31 (×2): 1.25 mg via RESPIRATORY_TRACT
  Filled 2021-10-30 (×2): qty 0.5

## 2021-10-30 MED ORDER — FOLIC ACID 1 MG PO TABS
1.0000 mg | ORAL_TABLET | Freq: Every day | ORAL | Status: DC
  Administered 2021-10-30 – 2021-11-02 (×4): 1 mg via ORAL
  Filled 2021-10-30 (×4): qty 1

## 2021-10-30 MED ORDER — ADULT MULTIVITAMIN W/MINERALS CH
1.0000 | ORAL_TABLET | Freq: Every day | ORAL | Status: DC
  Administered 2021-10-30 – 2021-11-02 (×4): 1 via ORAL
  Filled 2021-10-30 (×4): qty 1

## 2021-10-30 MED ORDER — LORAZEPAM 2 MG/ML IJ SOLN: 1.0000 mg | INTRAMUSCULAR | Status: DC | PRN

## 2021-10-30 MED ORDER — PANTOPRAZOLE SODIUM 40 MG IV SOLR
40.0000 mg | INTRAVENOUS | Status: DC
  Administered 2021-10-30: 40 mg via INTRAVENOUS
  Filled 2021-10-30: qty 10

## 2021-10-30 MED ORDER — DILTIAZEM HCL 30 MG PO TABS
30.0000 mg | ORAL_TABLET | Freq: Four times a day (QID) | ORAL | Status: DC
  Administered 2021-10-30 – 2021-10-31 (×5): 30 mg via ORAL
  Filled 2021-10-30 (×5): qty 1

## 2021-10-30 MED ORDER — HYDROMORPHONE HCL 1 MG/ML IJ SOLN
0.5000 mg | INTRAMUSCULAR | Status: DC | PRN
  Filled 2021-10-30: qty 0.5

## 2021-10-30 MED ORDER — DIPHENHYDRAMINE HCL 50 MG/ML IJ SOLN
25.0000 mg | Freq: Once | INTRAMUSCULAR | Status: AC
  Administered 2021-10-30: 25 mg via INTRAVENOUS
  Filled 2021-10-30: qty 1

## 2021-10-30 MED ORDER — THIAMINE HCL 100 MG/ML IJ SOLN
100.0000 mg | Freq: Every day | INTRAMUSCULAR | Status: DC
  Administered 2021-10-31: 100 mg via INTRAVENOUS
  Filled 2021-10-30: qty 2

## 2021-10-30 MED ORDER — LACTATED RINGERS IV BOLUS
500.0000 mL | Freq: Once | INTRAVENOUS | Status: AC
  Administered 2021-10-30: 500 mL via INTRAVENOUS

## 2021-10-30 MED ORDER — PREDNISONE 20 MG PO TABS
40.0000 mg | ORAL_TABLET | Freq: Every day | ORAL | Status: DC
  Administered 2021-10-31: 40 mg via ORAL
  Filled 2021-10-30: qty 2

## 2021-10-30 MED ORDER — SODIUM CHLORIDE 0.9 % IV SOLN
250.0000 mg | Freq: Every day | INTRAVENOUS | Status: DC
  Administered 2021-10-30: 250 mg via INTRAVENOUS
  Filled 2021-10-30: qty 20

## 2021-10-30 MED ORDER — PANTOPRAZOLE SODIUM 40 MG IV SOLR
40.0000 mg | Freq: Two times a day (BID) | INTRAVENOUS | Status: DC
  Administered 2021-10-30 – 2021-11-03 (×9): 40 mg via INTRAVENOUS
  Filled 2021-10-30 (×10): qty 10

## 2021-10-30 MED ORDER — THIAMINE HCL 100 MG PO TABS
100.0000 mg | ORAL_TABLET | Freq: Every day | ORAL | Status: DC
  Administered 2021-10-30 – 2021-11-02 (×3): 100 mg via ORAL
  Filled 2021-10-30 (×3): qty 1

## 2021-10-30 MED ORDER — METHYLPREDNISOLONE SODIUM SUCC 125 MG IJ SOLR
80.0000 mg | Freq: Once | INTRAMUSCULAR | Status: AC
  Administered 2021-10-30: 80 mg via INTRAVENOUS
  Filled 2021-10-30: qty 2

## 2021-10-30 NOTE — Assessment & Plan Note (Addendum)
Reports drinking about 2 of 40 ounce beers a day.  Denies withdrawal symptoms ?-Counseled on alcohol cessation. ? ?

## 2021-10-30 NOTE — Assessment & Plan Note (Addendum)
Likely from steroid.  A1c 5.6%.  Improved after discontinuing the steroid. ?

## 2021-10-30 NOTE — Assessment & Plan Note (Signed)
Body mass index is 41.71 kg/m?. ?Encourage lifestyle change to lose weight. ?

## 2021-10-30 NOTE — Progress Notes (Signed)
ANTICOAGULATION CONSULT NOTE - Follow Up Consult ? ?Pharmacy Consult for Heparin ?Indication: atrial fibrillation ? ?Allergies  ?Allergen Reactions  ? Tetracyclines & Related Itching  ? ? ?Patient Measurements: ?Height: '5\' 3"'$  (160 cm) ?Weight: 106.8 kg (235 lb 7.2 oz) ?IBW/kg (Calculated) : 52.4 ?Heparin Dosing Weight: 77.4 kg ? ?Vital Signs: ?Temp: 97.9 ?F (36.6 ?C) (04/12 0330) ?Temp Source: Axillary (04/12 0330) ?BP: 109/90 (04/12 0700) ?Pulse Rate: 112 (04/12 0700) ? ?Labs: ?Recent Labs  ?  10/27/21 ?1925 10/28/21 ?0020 10/29/21 ?0226 10/29/21 ?1031 10/29/21 ?1631 10/29/21 ?1910 10/30/21 ?0246  ?HGB 10.5* 10.3*  --   --   --   --  9.0*  ?HCT 32.6* 32.1*  --   --   --   --  28.8*  ?PLT 410* 325  --   --   --   --  304  ?HEPARINUNFRC  --   --   --   --  0.20* 0.26* 0.29*  ?CREATININE 0.55 0.59 0.61  --   --   --  0.69  ?TROPONINIHS 14 14  --  8  --   --   --   ? ? ?Estimated Creatinine Clearance: 87.6 mL/min (by C-G formula based on SCr of 0.69 mg/dL). ? ? ?Medications:  ?Scheduled:  ? arformoterol  15 mcg Nebulization BID  ? azithromycin  500 mg Oral QHS  ? budesonide (PULMICORT) nebulizer solution  0.5 mg Nebulization BID  ? chlorhexidine  15 mL Mouth Rinse BID  ? Chlorhexidine Gluconate Cloth  6 each Topical Daily  ? levalbuterol  1.25 mg Nebulization Q6H  ? mouth rinse  15 mL Mouth Rinse q12n4p  ? methylPREDNISolone (SOLU-MEDROL) injection  80 mg Intravenous Q12H  ? potassium chloride SA  40 mEq Oral Daily  ? revefenacin  175 mcg Nebulization Daily  ? ?Infusions:  ? amiodarone 30 mg/hr (10/30/21 0700)  ? diltiazem (CARDIZEM) infusion 15 mg/hr (10/30/21 0700)  ? heparin 1,500 Units/hr (10/30/21 0700)  ? ? ?Assessment: ?Pharmacy consulted to dose heparin for atrial fibrillation.  No anticoagulants PTA. ?  ?Today, 10/30/2021: ?Heparin level 0.46 on heparin 1500 units/hr ?CBC: Hgb 9, low and decreased from admit; pltc WNL ?No bleeding or infusion related issues reported by RN ? ?Goal of Therapy:  ?Heparin level  0.3-0.7 units/ml ?Monitor platelets by anticoagulation protocol: Yes ?  ?Plan:  ?Continue heparin IV infusion at 1500 units/hr ?Confirmatory Heparin level in 6 hours ?Daily heparin level and CBC ?F/u timing, plans for TEE guided cardioversion (delayed until 4/13).  ? ?Gretta Arab PharmD, BCPS ?Clinical Pharmacist ?[facility] [phone] ?10/30/2021 7:52 AM ? ? ?

## 2021-10-30 NOTE — Progress Notes (Addendum)
? ?NAME:  [name] [name], MRN:  [id number], DOB:  [birth year], LOS: 3 ?ADMISSION DATE:  10/27/2021, CONSULTATION DATE:  10/30/21 ?REFERRING MD:  Starla Link, CHIEF COMPLAINT:   shortness of breath ? ?History of Present Illness:  ?[name] [name] is a 61 y.o. F with PMH of asthma and likely OSA/OHS,  ETOH abuse, atrial fibrillation, HTN, GAD, HFpEF who presented to the ED with shortness of breath on 4/9.     She complained of two days of shortness of breath and wheezing with mild cough and peripheral edema without orthopnea.  She had no associated chest pain or leg pain.   She was hospitalized in 04/2021 for shortness of breath which was thought to be a possible COPD exacerbation and was treated with Bipap.   She followed up with [facility] and recommended a sleep study and PFT's, thought to likely have mild intermittent asthma, required 2L home O2.   When evaluated today, pt states that she does not want to talk, so history taken from [facility].   Per notes she has never been a smoker or formally diagnosed with COPD. ? ? ?This admission, pt was started on Azithromycin, Solumedrol and xopanex. Her heart rhythm irregular with PVC's, but rate controlled and was admitted to the hospitalist service.  On 4/10 rhythm changed to Afib with RVR and she was started on Amiodarone.  On 4/11 she had some increased dyspnea and PCCM consulted.  ? ?Pertinent  Medical History  ? has a past medical history of Asthma, Bloody stool, Chronic diastolic heart failure ([place]), Dental decay, Gastritis, Helicobacter pylori (58/52/7782), Hypertension, Paroxysmal atrial fibrillation ([place]), Pneumonia (06/21/2015), Prepyloric ulcer (06/22/2015), Seasonal allergies, and Shortness of breath dyspnea. ? ? ?Significant Hospital Events: ?Including procedures, antibiotic start and stop dates in addition to other pertinent events   ?4/9 admitted with shortness of breath and PVC's ?4/10 Developed Afib with RVR, started amiodarone and then Digoxin along with heparin  gtt ?4/11 Episode of dyspnea, on 3L [place] O2 then Bipap, PCCM consult ?4/12 stable from respiratory standpoint, Atrial fibrillation remains difficult to control ? ?Significant tests: ? ?CTA chest: ?1. Examination for pulmonary embolism is somewhat limited by breath ?motion artifact, particularly in the lung bases. Within this ?limitation, no evidence of pulmonary embolism through the segmental ?pulmonary arterial level. ?2. Cardiomegaly. ?3. Enlargement of the main pulmonary artery, as can be seen in ?pulmonary hypertension. ?4. 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 Recommend dedicated CT ?angiogram of the abdomen and pelvis to further evaluate on a ?nonemergent basis, when clinically appropriate. ? ? ?Micro: ?4/10 MRSA negative ?4/10 Covid and Flu negative ? ? ? ?Abx: ?4/9 Azithromycin ? ?Interim History / Subjective:  ?No complaints this morning,  wore bipap last night and is on [place], started on Cardizem for RVR  ? ? ?Objective   ?Blood pressure 109/90, pulse (!) 112, temperature 97.8 ?F (36.6 ?C), temperature source Axillary, resp. rate 17, height '5\' 3"'$  (1.6 m), weight 106.8 kg, SpO2 98 %. ?   ?FiO2 (%):  [40 %] 40 %  ? ?Intake/Output Summary (Last 24 hours) at 10/30/2021 0902 ?Last data filed at 10/30/2021 0700 ?Gross per 24 hour  ?Intake 1471.64 ml  ?Output --  ?Net 1471.64 ml  ? ? ?Filed Weights  ? 10/28/21 0500 10/29/21 0500 10/30/21 0417  ?Weight: 105.1 kg 107 kg 106.8 kg  ? ?General:  well-nourished F, awake  and in no distress ?HEENT: MM pink/moist, sclera anicteric ?Neuro: awake, calm, oriented, no tremors  ?CV: s1s2 tachycardic and irregular, no m/r/g ?PULM:  clear bilaterally without significant wheezing or rhonchi, no distress, on 2 L [place] O2 ?GI: soft, bsx4 active  ?Extremities: warm/dry, no edema  ?Skin: no rashes or lesions ? ? ?Resolved Hospital Problem list    ? ? ?Assessment & Plan:  ? ? ?Acute on Chronic Hypoxic respiratory failure ?Likely baseline Asthma with undiagnosed OSA/OHS with mild flare and dyspnea worsened with Afib RVR ?-stable from respiratory standpoint ?-continue Azithromycin, continue Pulmicort, Brovana, and Yupelri nebs ?-recommend stop solumedrol and start prolonged prednisone taper, '40mg'$  x 3d, '30mg'$  3 days and '20mg'$  3 days, '10mg'$  3 days ?-Bipap prn  ?-No PE on CTA chest ?-needs outpatient pulmonology follow up, requested through [facility].  PCCM will sign off, please re-consult for any pulmonary or critical care concerns ? ? ?Atrial Fibrillation with RVR ?SR then converted back to RVR overnight ?-cardiology managing, placed on Cardizem and Amiodarone and heparin, received Digoxin, likely TEE guided cardioversion  ? ? ?Issues Managed by primary team: ?Chronic ETOH abuse ?HTN ?Chronic Anemia  ?Hypokalemia ? ? ? ? ? ?Best Practice (right click and "Reselect all SmartList Selections" daily)  ? ?Per primary ? ?Labs   ?CBC: ?Recent Labs  ?Lab 10/27/21 ?1925 10/28/21 ?0020 10/30/21 ?0246  ?WBC 13.1* 13.4* 13.6*  ?NEUTROABS 8.5* 12.5*  --   ?HGB 10.5* 10.3* 9.0*  ?HCT 32.6* 32.1* 28.8*  ?MCV 83.0 82.9 82.5  ?PLT 410* 325 304  ? ? ? ?Basic Metabolic Panel: ?Recent Labs  ?Lab 10/27/21 ?1925 10/27/21 ?2220 10/28/21 ?0020 10/29/21 ?0226 10/30/21 ?0246  ?NA 139  --  140 137 136  ?K 2.8*  --  3.4* 4.0 4.5  ?CL 96*  --  99 100 101  ?CO2 32  --  '28 27 27  '$ ?GLUCOSE 137*  --  176* 203* 234*  ?BUN 8  --  '9 19 19  '$ ?CREATININE 0.55  --  0.59 0.61 0.69  ?CALCIUM 9.6  --  9.5 9.6 9.1  ?MG  --  1.6* 2.3 2.0 2.0  ?PHOS  --   --  4.4  --   --   ? ? ?GFR: ?Estimated Creatinine Clearance: 87.6 mL/min (by C-G formula based on SCr of 0.69 mg/dL). ?Recent Labs  ?Lab 10/27/21 ?1925 10/27/21 ?2218 10/28/21 ?0020 10/30/21 ?0246  ?PROCALCITON  --  <0.10  --   --   ?WBC 13.1*  --  13.4* 13.6*  ? ? ? ?Liver Function Tests: ?Recent Labs  ?Lab 10/28/21 ?0020  ?AST 21  ?ALT 19  ?ALKPHOS 76   ?BILITOT 0.8  ?PROT 7.7  ?ALBUMIN 3.9  ? ? ?No results for input(s): LIPASE, AMYLASE in the last 168 hours. ?No results for input(s): AMMONIA in the last 168 hours. ? ?ABG ?   ?Component Value Date/Time  ? PHART 7.510 (H) 05/15/2021 0544  ? PCO2ART 32.7 05/15/2021 0544  ? PO2ART 76.4 (L) 05/15/2021 0544  ? HCO3 34.5 (H) 10/28/2021 0020  ? TCO2 28 01/01/2011 1532  ? O2SAT 94.2 10/28/2021 0020  ? ?  ? ?Coagulation Profile: ?No results for input(s): INR, PROTIME in the last 168 hours. ? ?Cardiac Enzymes: ?No results for input(s): CKTOTAL, CKMB, CKMBINDEX, TROPONINI in the last 168 hours. ? ?HbA1C: ?Hgb A1c MFr Bld  ?Date/Time Value Ref Range Status  ?05/15/2021 04:38 AM 6.0 (H) 4.8 - 5.6 % Final  ?  Comment:  ?  (NOTE) ?  Pre diabetes:          5.7%-6.4% ? ?Diabetes:              >6.4% ? ?Glycemic control for   <7.0% ?adults with diabetes ?  ?03/19/2021 05:26 AM 5.8 (H) 4.8 - 5.6 % Final  ?  Comment:  ?  (NOTE) ?        Prediabetes: 5.7 - 6.4 ?        Diabetes: >6.4 ?        Glycemic control for adults with diabetes: <7.0 ?  ? ? ?CBG: ?No results for input(s): GLUCAP in the last 168 hours. ? ?Review of Systems:   ?Please see the history of present illness. All other systems reviewed and are negative  ? ? ?Past Medical History:  ?She,  has a past medical history of Asthma, Bloody stool, Chronic diastolic heart failure ([place]), Dental decay, Gastritis, Helicobacter pylori (48/27/0786), Hypertension, Paroxysmal atrial fibrillation ([place]), Pneumonia (06/21/2015), Prepyloric ulcer (06/22/2015), Seasonal allergies, and Shortness of breath dyspnea.  ? ?Surgical History:  ? ?Past Surgical History:  ?Procedure Laterality Date  ? BARTHOLIN CYST MARSUPIALIZATION Left 1997  ? [place]  ? Repair of PFO  ? COLONOSCOPY N/A 2002  ? Performed secondary to mother's diagnosis of colon CA at age 25  ? ESOPHAGOGASTRODUODENOSCOPY (EGD) WITH PROPOFOL N/A 06/22/2015  ? Procedure: ESOPHAGOGASTRODUODENOSCOPY (EGD) WITH PROPOFOL;   Surgeon: Arta Silence, MD;  Location: [facility] ENDOSCOPY;  Service: Endoscopy;  Laterality: N/A;  ? FRACTURE SURGERY    ? NASAL SINUS SURGERY Bilateral 1984  ? OVARIAN CYST REMOVAL Left 1990  ? TUBAL LIGATION    ? UMBILICAL HERNIA RE

## 2021-10-30 NOTE — Progress Notes (Signed)
Pt taken off bipap. ?

## 2021-10-30 NOTE — Assessment & Plan Note (Signed)
Low TSH but normal free T4.  Unreliable in acute setting but does not suggest hyperthyroidism. ?

## 2021-10-30 NOTE — Progress Notes (Signed)
ANTICOAGULATION CONSULT NOTE  ? ?Pharmacy Consult for heparin ?Indication: atrial fibrillation ? ?Allergies  ?Allergen Reactions  ? Tetracyclines & Related Itching  ? ? ?Patient Measurements: ?Height: '5\' 3"'  (160 cm) ?Weight: 107 kg (235 lb 14.3 oz) ?IBW/kg (Calculated) : 52.4 ?Heparin Dosing Weight: 77.4 kg ? ?Vital Signs: ?Temp: 97.9 ?F (36.6 ?C) (04/12 0330) ?Temp Source: Axillary (04/12 0330) ?BP: 113/84 (04/12 0315) ?Pulse Rate: 140 (04/12 0315) ? ?Labs: ?Recent Labs  ?  10/27/21 ?1925 10/28/21 ?0020 10/29/21 ?0226 10/29/21 ?1031 10/29/21 ?1631 10/29/21 ?1910 10/30/21 ?0246  ?HGB 10.5* 10.3*  --   --   --   --  9.0*  ?HCT 32.6* 32.1*  --   --   --   --  28.8*  ?PLT 410* 325  --   --   --   --  304  ?HEPARINUNFRC  --   --   --   --  0.20* 0.26* 0.29*  ?CREATININE 0.55 0.59 0.61  --   --   --  0.69  ?TROPONINIHS 14 14  --  8  --   --   --   ? ? ? ?Estimated Creatinine Clearance: 87.6 mL/min (by C-G formula based on SCr of 0.69 mg/dL). ? ? ?Medical History: ?Past Medical History:  ?Diagnosis Date  ? Asthma   ? Bloody stool   ? Chronic diastolic heart failure ([place])   ? Dental decay   ? Gastritis, Helicobacter pylori 54/65/0354  ? Hypertension   ? Paroxysmal atrial fibrillation ([facility])   ? Pneumonia 06/21/2015  ? Prepyloric ulcer 06/22/2015  ? Seasonal allergies   ? Shortness of breath dyspnea   ? with exertion  ? ? ?Medications:  ?Medications Prior to Admission  ?Medication Sig Dispense Refill Last Dose  ? amLODipine (NORVASC) 10 MG tablet Take 1 tablet (10 mg total) by mouth daily. 90 tablet 1 10/27/2021  ? cholecalciferol (VITAMIN D3) 25 MCG (1000 UNIT) tablet Take 1,000 Units by mouth daily.   10/27/2021  ? fluticasone furoate-vilanterol (BREO ELLIPTA) 200-25 MCG/INH AEPB Inhale 1 puff into the lungs daily. 60 each 11 10/27/2021  ? furosemide (LASIX) 40 MG tablet Take 1 tablet (40 mg total) by mouth daily. 30 tablet 11 10/27/2021  ? OXYGEN Inhale 2 L/min into the lungs continuous.   10/27/2021  ? potassium chloride SA  (KLOR-CON) 20 MEQ tablet Take 1 tablet (20 mEq total) by mouth daily. Take along with lasix 30 tablet 2 10/27/2021  ? thiamine 100 MG tablet Take 1 tablet (100 mg total) by mouth daily. 30 tablet 0 10/27/2021  ? albuterol (VENTOLIN HFA) 108 (90 Base) MCG/ACT inhaler INHALE 2 PUFFS INTO THE LUNGS EVERY 6 (SIX) HOURS AS NEEDED FOR WHEEZING OR SHORTNESS OF BREATH. (Patient not taking: Reported on 10/29/2021) 8.5 g 1 Not Taking  ? Blood Pressure Monitor KIT 1 kit by Does not apply route 3 (three) times daily as needed. 1 kit 0   ? budesonide-formoterol (SYMBICORT) 160-4.5 MCG/ACT inhaler Inhale 2 puffs into the lungs 2 (two) times daily as needed (shortness of breath/wheezing).     ? diclofenac Sodium (VOLTAREN) 1 % GEL Apply as needed twice a day for knee pain (Patient not taking: Reported on 10/29/2021) 400 g 0 Not Taking  ? FLUoxetine (PROZAC) 20 MG capsule Take 1 capsule (20 mg total) by mouth daily. (Patient not taking: Reported on 10/29/2021) 30 capsule 1 Not Taking  ? hydrochlorothiazide (HYDRODIURIL) 25 MG tablet Take 1 tablet (25 mg total) by mouth daily. (Patient not  taking: Reported on 10/29/2021) 90 tablet 3 Not Taking  ? hydrOXYzine (ATARAX/VISTARIL) 25 MG tablet TAKE 1 TABLET (25 MG TOTAL) BY MOUTH EVERY 8 (EIGHT) HOURS AS NEEDED. (Patient not taking: Reported on 10/29/2021) 90 tablet 2 Not Taking  ? loratadine (CLARITIN) 10 MG tablet Take 1 tablet (10 mg total) by mouth daily. (Patient not taking: Reported on 10/29/2021) 30 tablet 11 Not Taking  ? traZODone (DESYREL) 50 MG tablet Take 1 tablet (50 mg total) by mouth at bedtime. (Patient not taking: Reported on 10/29/2021) 30 tablet 1 Not Taking  ? valsartan (DIOVAN) 40 MG tablet Take 1 tablet (40 mg total) by mouth daily. (Patient not taking: Reported on 10/29/2021) 90 tablet 1 Not Taking  ? ? ?Assessment: ?Pharmacy consulted to dose heparin for atrial fibrillation.  No anticoagulants PTA. ?Hg 10.3, PLT WNL.  No bleeding reported. ? ?10/30/2021: ?Heparin level 0.29-  slightly below goal range on IV heparin 1400 units/hr ?CBC: Hg 9.0- low and slightly decreased from admit; pltc WNL ?No bleeding or infusion related issues reported by RN ?Plan for TEE guided cardioversion today ? ?Goal of Therapy:  ?Heparin level 0.3-0.7 units/ml ?Monitor platelets by anticoagulation protocol: Yes ?  ?Plan:  ?Increase Heparin drip to 1500 units/hr ?Check heparin level 6 hours after bolus ?Daily heparin level & CBC ? ?Netta Cedars PharmD ?10/30/2021,4:02 AM ? ? ?

## 2021-10-30 NOTE — Hospital Course (Addendum)
62 year old F with PMH of COPD, chronic hypoxic RF on 2 L, A-fib/PVCs, diastolic CHF, alcohol abuse, anxiety, chronic pain, anemia, gastric ulcer morbid obesity presenting with progressive shortness of breath and admitted for acute on chronic hypoxic respiratory failure due to COPD exacerbation and A-fib with RVR.  Initially required BiPAP for hypoxic respiratory failure and work of breathing.  Cardiology and pulmonology consulted.   ? ?Respiratory symptoms improved.  Pulmonology signed off.   ? ?Patient had successful TEE cardioversion for A-fib with RVR on 10/31/2021.  TTE with LVEF of 25 to 30% with global and regional hypokinesis.  TTE ordered.  Cardiology following. ? ?GI consulted for iron deficiency anemia.  However, Hemoccult was negative.  H&H remained stable.  [facility] consulted. ? ?EGD on 4/17 with esophageal plaques (biopsied), 2 cm hiatal hernia and erythematous mucosa in the antrum (biopsied) and normal duodenum (biopsied).  Colonoscopy with normal examined ileum, colon polyps and nonbleeding internal hemorrhoid. ? ?Anticoagulation resumed with p.o. Eliquis.  Cleared for discharge by cardiology for outpatient follow-up. ?

## 2021-10-30 NOTE — Progress Notes (Signed)
Progress Note ? ?Patient Name: [name] [name] ?Date of Encounter: 10/30/2021 ? ?Attending physician: Mercy Riding, MD ?Primary care provider: Kerin Perna, NP ? ?Subjective: ?[name] [name] is a 61 y.o. female who was seen and examined at bedside  ?No family present at bedside. ?Denies chest pain or shortness of breath. ?Continues to have some audible wheezing. ?Morning labs noted a drop in hemoglobin from 10.3 g/dL to now 9 g/dL.  This is comparison to August 2022 her hemoglobin used to be 13 g/dL. ?She has an extensive history of alcohol use.  When asked, she drinks basis patient states " I do not say."  Her last alcohol drink was 10/26/2021. ?Case discussed and reviewed with her nurse. ? ?Objective: ?Vital Signs in the last 24 hours: ?Temp:  [97.7 ?F (36.5 ?C)-98.4 ?F (36.9 ?C)] 97.7 ?F (36.5 ?C) (04/12 1157) ?Pulse Rate:  [25-164] 141 (04/12 1400) ?Resp:  [12-27] 19 (04/12 1400) ?BP: (99-162)/(52-117) 112/68 (04/12 1400) ?SpO2:  [91 %-100 %] 97 % (04/12 1400) ?FiO2 (%):  [40 %] 40 % (04/12 0000) ?Weight:  [106.8 kg] 106.8 kg (04/12 0417) ? ?Intake/Output: ? ?Intake/Output Summary (Last 24 hours) at 10/30/2021 1509 ?Last data filed at 10/30/2021 1100 ?Gross per 24 hour  ?Intake 1743.25 ml  ?Output 300 ml  ?Net 1443.25 ml  ?  ?Net IO Since Admission: 2,286.35 mL [10/30/21 1509] ? ?Weights:  ?Filed Weights  ? 10/28/21 0500 10/29/21 0500 10/30/21 0417  ?Weight: 105.1 kg 107 kg 106.8 kg  ? ? ?Telemetry: Personally reviewed.  Atrial fibrillation with improved ventricular rate. ? ?Physical examination: ?PHYSICAL EXAM: ? ?  10/30/2021  ?  2:00 PM 10/30/2021  ?  1:30 PM 10/30/2021  ?  1:00 PM  ?Vitals with BMI  ?Systolic 583 094 076  ?Diastolic 68 89 82  ?Pulse 141  65  ? ? ?General: Age-appropriate female, hemodynamically stable, no acute distress. ?Skin: Warm to touch, no rashes or jaundice. ?HEENT: Normocephalic, atraumatic, no scleral icterus, moist mucous membranes, no JVP or carotid bruit. ?Chest: Normal respiratory  and expiratory efforts, no intercostal retractions noted. ?Lungs: Expiratory wheezes noted in the upper lung fields posteriorly, no rales or rhonchi's.  Surgical scar over the left lateral chest wall is well-healed. ?Heart: Tachycardic, irregularly irregular, variable S1-S2, no murmurs rubs or gallops appreciated secondary to tachycardia. ?Abdomen: Soft, nontender, nondistended, positive bowel sounds in all 4 quadrants, ?Extremities: Nonpitting, warm to touch, 2+ DP and PT pulses. ? ? ?Lab Results: ?Hematology ?Recent Labs  ?Lab 10/27/21 ?1925 10/28/21 ?0020 10/30/21 ?0246 10/30/21 ?8088  ?WBC 13.1* 13.4* 13.6*  --   ?RBC 3.93 3.87 3.49* 3.51*  ?HGB 10.5* 10.3* 9.0*  --   ?HCT 32.6* 32.1* 28.8*  --   ?MCV 83.0 82.9 82.5  --   ?MCH 26.7 26.6 25.8*  --   ?MCHC 32.2 32.1 31.3  --   ?RDW 17.7* 18.0* 18.2*  --   ?PLT 410* 325 304  --   ? ? ?Chemistry ?Recent Labs  ?Lab 10/28/21 ?0020 10/29/21 ?0226 10/30/21 ?0246  ?NA 140 137 136  ?K 3.4* 4.0 4.5  ?CL 99 100 101  ?CO2 '28 27 27  '$ ?GLUCOSE 176* 203* 234*  ?BUN '9 19 19  '$ ?CREATININE 0.59 0.61 0.69  ?CALCIUM 9.5 9.6 9.1  ?PROT 7.7  --   --   ?ALBUMIN 3.9  --   --   ?AST 21  --   --   ?ALT 19  --   --   ?ALKPHOS  76  --   --   ?BILITOT 0.8  --   --   ?GFRNONAA >60 >60 >60  ?ANIONGAP '13 10 8  '$ ?  ? ?Cardiac Enzymes: ?Cardiac Panel (last 3 results) ?Recent Labs  ?  10/27/21 ?1925 10/28/21 ?0020 10/29/21 ?1031  ?TROPONINIHS '14 14 8  '$ ? ? ?BNP (last 3 results) ?Recent Labs  ?  03/22/21 ?0300 05/14/21 ?0654 10/27/21 ?1925  ?BNP 109.3* 150.6* 173.6*  ? ? ?ProBNP (last 3 results) ?No results for input(s): PROBNP in the last 8760 hours. ? ? ?DDimer No results for input(s): DDIMER in the last 168 hours.  ? ?Hemoglobin A1c:  ?Lab Results  ?Component Value Date  ? HGBA1C 5.6 10/30/2021  ? MPG 114.02 10/30/2021  ? ? ?TSH  ?Recent Labs  ?  03/12/21 ?0901 10/29/21 ?1047  ?TSH CANCELED 0.339*  ? ? ?Lipid Panel  ?   ?Component Value Date/Time  ? CHOL 181 08/22/2021 1143  ? TRIG 118 08/22/2021  1143  ? HDL 58 08/22/2021 1143  ? CHOLHDL 3.1 08/22/2021 1143  ? CHOLHDL 2.4 03/15/2010 0447  ? VLDL 12 03/15/2010 0447  ? LDLCALC 102 (H) 08/22/2021 1143  ? ? ?Imaging: ?CT Angio Chest Pulmonary Embolism (PE) W or WO Contrast ? ?Result Date: 10/29/2021 ?CLINICAL DATA:  Chest pain, shortness of breath, PE suspected EXAM: CT ANGIOGRAPHY CHEST WITH CONTRAST TECHNIQUE: Multidetector CT imaging of the chest was performed using the standard protocol during bolus administration of intravenous contrast. Multiplanar CT image reconstructions and MIPs were obtained to evaluate the vascular anatomy. RADIATION DOSE REDUCTION: This exam was performed according to the departmental dose-optimization program which includes automated exposure control, adjustment of the mA and/or kV according to patient size and/or use of iterative reconstruction technique. CONTRAST:  29m OMNIPAQUE IOHEXOL 350 MG/ML SOLN COMPARISON:  None. FINDINGS: Cardiovascular: Examination for pulmonary embolism is somewhat limited by breath motion artifact, particularly in the lung bases. Within this limitation, no evidence of pulmonary embolism through the segmental pulmonary arterial level. Cardiomegaly. No pericardial effusion. Enlargement of the main pulmonary artery, measuring up to 3.6 cm in caliber. Aortic atherosclerosis. Mediastinum/Nodes: No enlarged mediastinal, hilar, or axillary lymph nodes. Benign, coarsely calcified AP window nodes (series 8, image 97). Thyroid gland, trachea, and esophagus demonstrate no significant findings. Lungs/Pleura: Lungs are clear. No pleural effusion or pneumothorax. Incidental small pleural lipoma about the peripheral right upper lobe (series 8, image 161). Upper Abdomen: No acute abnormality. 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 Musculoskeletal: No  chest wall abnormality. No acute osseous findings. Review of the MIP images confirms the above findings. IMPRESSION: 1. Examination for pulmonary embolism is somewhat limited by breath motion artifact, particularly in the lung bases. Within this limitation, no evidence of pulmonary embolism through the segmental pulmonary arterial level. 2. Cardiomegaly. 3. Enlargement of the main pulmonary artery, as can be seen in pulmonary hypertension. 4. 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 Recommend dedicated CT angiogram of the abdomen and pelvis to further evaluate on a nonemergent basis, when clinically appropriate. Aortic Atherosclerosis (ICD10-I70.0). Electronically Signed   By: ADelanna AhmadiM.D.   On: 10/29/2021 13:52   ? ?CARDIAC DATABASE: ?EKG: ?10/27/2021: Sinus tachycardia, 116 bpm, frequent PVCs. ?  ?  10/29/2021: A-fib with RVR, 162 bpm. ?  ?Echocardiogram: ?03/19/2021: LVEF 60-65%, moderate LVH, grade 2 diastolic dysfunction, elevated LAP, moderately dilated left atrium, mild MR, mild AR, estimated RAP 15 mmHg. ?  ?Stress test: ?None ?  ?Heart catheterization: ?None ?  ? ?Scheduled Meds: ? arformoterol  15 mcg Nebulization BID  ? azithromycin  500 mg Oral QHS  ? budesonide (PULMICORT) nebulizer solution  0.5 mg Nebulization BID  ? chlorhexidine  15 mL Mouth Rinse BID  ? Chlorhexidine Gluconate Cloth  6 each Topical Daily  ? diltiazem  30 mg Oral Q6H  ? levalbuterol  1.25 mg Nebulization Q6H  ? mouth rinse  15 mL Mouth Rinse q12n4p  ? pantoprazole (PROTONIX) IV  40 mg Intravenous Q24H  ? potassium chloride SA  40 mEq Oral Daily  ? [START ON 10/31/2021] predniSONE  40 mg Oral Q breakfast  ? revefenacin  175 mcg Nebulization Daily  ? ? ?Continuous Infusions: ? amiodarone 30 mg/hr (10/30/21 0700)  ? diltiazem (CARDIZEM) infusion 15 mg/hr (10/30/21 0900)  ? ferric gluconate (FERRLECIT) IVPB  250 mg (10/30/21 1506)  ? heparin 1,500 Units/hr (10/30/21 0900)  ? ? ?PRN Meds: ?acetaminophen **OR** acetaminophen, hydrOXYzine, levalbuterol, metoprolol tartrate, nitroGLYCERIN  ? ?IMPRESSION & RECOMMEN

## 2021-10-30 NOTE — TOC Initial Note (Signed)
Transition of Care (TOC) - Initial/Assessment Note  ? ? ?Patient Details  ?Name: [name] [name] ?MRN: [id number] ?Date of Birth: [birth year] ? ?Transition of Care (TOC) CM/SW Contact:    ?Tawanna Cooler, RN ?Phone Number: ?10/30/2021, 10:53 AM ? ?Clinical Narrative:                 ? ?Reviewed chart.  In previous admissions, patient was set up with home O2 through [place].  Also received a bedside commode, RW, and wheelchair through [place].   ?Lives with family.   ?TOC following for discharge needs.  ? ? ?

## 2021-10-30 NOTE — Consult Note (Signed)
Referring Provider: Mercy Riding, MD ?Primary Care Physician:  Kerin Perna, NP ?Primary Gastroenterologist:  [facility], Dr. Paulita Fujita ? ?Reason for Consultation:  Iron deficiency anemia ? ?HPI: [name] [name] is a 61 y.o. female with medical history significant for COPD, paroxysmal A. fib, HTN, GAD, chronic anemia with baseline hemoglobin 96-28, chronic diastolic heart failure, who was admitted to [facility] on 10/27/2021 with suspected acute COPD exacerbation after presenting from home to [facility] ED complaining of shortness of breath. Patient initially required BiPAP for hypoxic respiratory failure and work of breathing. Currently on 2L oxygen per nasal cannula. ? ?GI is consulted due to slight drop in patient's hemoglobin, history of gastric ulcer and H. Pylori infection, and ongoing NSAID use with report of dark stool 2 weeks ago. Labs consistent with severe iron deficiency. ? ?Patient states she came to the ER for shortness of breath.  Reports history of gastric ulcer with no issues since 2016.  Patient states that she had a cold 2 weeks ago and had 1 episode of dark stools, which she had not seen since diagnosed with gastric ulcer in 2016.  Denies iron supplementation or Pepto bismol at home. She has had normal bowel movements in the last week and denies diarrhea, melena, or bright red bleeding.  States her bowel movements are irregular and takes a laxative if she has not had a BM in 2 or 3 days.  ? ?Patient has been taking NSAIDs at home, states she was unaware that this put her at increased risk of gastric ulcer.  Denies smoking but drinks alcohol daily, unwilling to specify exact amount with family present. ? ?Patient has heartburn about once a week, denies PPI use. Denies blood thinners at home but is currently on heparin drip due to A-fib.  She is on oxygen supplementation at home, 2 L per nasal cannula. ? ?Patient reports that her mother had colon cancer, diagnosed in her early 76s.  Patient's last  colonoscopy was 2002 and she has not had any form of screening since. ? ?EGD 06/22/2015 with Dr. Paulita Fujita for melena, anemia - Gastric ulcer, biopsies showed chronic H. pylori gastritis ? ?Last colonoscopy 2002, reportedly normal chart review.  Document unavailable. ? ?Past Medical History:  ?Diagnosis Date  ? Asthma   ? Bloody stool   ? Chronic diastolic heart failure ([place])   ? Dental decay   ? Gastritis, Helicobacter pylori 36/[id number]/9476  ? Hypertension   ? Paroxysmal atrial fibrillation (HCC)   ? Pneumonia 06/21/2015  ? Prepyloric ulcer 06/22/2015  ? Seasonal allergies   ? Shortness of breath dyspnea   ? with exertion  ? ? ?Past Surgical History:  ?Procedure Laterality Date  ? BARTHOLIN CYST MARSUPIALIZATION Left 1997  ? [place]  ? Repair of PFO  ? COLONOSCOPY N/A 2002  ? Performed secondary to mother's diagnosis of colon CA at age 73  ? ESOPHAGOGASTRODUODENOSCOPY (EGD) WITH PROPOFOL N/A 06/22/2015  ? Procedure: ESOPHAGOGASTRODUODENOSCOPY (EGD) WITH PROPOFOL;  Surgeon: Arta Silence, MD;  Location: [facility] ENDOSCOPY;  Service: Endoscopy;  Laterality: N/A;  ? FRACTURE SURGERY    ? NASAL SINUS SURGERY Bilateral 1984  ? OVARIAN CYST REMOVAL Left 1990  ? TUBAL LIGATION    ? UMBILICAL HERNIA REPAIR N/A 2005  ? ? ?Prior to Admission medications   ?Medication Sig Start Date End Date Taking? Authorizing Provider  ?amLODipine (NORVASC) 10 MG tablet Take 1 tablet (10 mg total) by mouth daily. 08/22/21  Yes Kerin Perna,  NP  ?cholecalciferol (VITAMIN D3) 25 MCG (1000 UNIT) tablet Take 1,000 Units by mouth daily.   Yes [provider]  ?fluticasone furoate-vilanterol (BREO ELLIPTA) 200-25 MCG/INH AEPB Inhale 1 puff into the lungs daily. 03/28/21  Yes Martyn Ehrich, NP  ?furosemide (LASIX) 40 MG tablet Take 1 tablet (40 mg total) by mouth daily. 03/25/21 03/25/22 Yes Oswald Hillock, MD  ?OXYGEN Inhale 2 L/min into the lungs continuous.   Yes [provider]  ?potassium chloride SA (KLOR-CON) 20 MEQ  tablet Take 1 tablet (20 mEq total) by mouth daily. Take along with lasix 03/25/21  Yes Oswald Hillock, MD  ?thiamine 100 MG tablet Take 1 tablet (100 mg total) by mouth daily. 03/26/21  Yes Oswald Hillock, MD  ?albuterol (VENTOLIN HFA) 108 (90 Base) MCG/ACT inhaler INHALE 2 PUFFS INTO THE LUNGS EVERY 6 (SIX) HOURS AS NEEDED FOR WHEEZING OR SHORTNESS OF BREATH. ?Patient not taking: Reported on 10/29/2021 08/30/21   Kerin Perna, NP  ?Blood Pressure Monitor KIT 1 kit by Does not apply route 3 (three) times daily as needed. 09/01/19   Kerin Perna, NP  ?budesonide-formoterol (SYMBICORT) 160-4.5 MCG/ACT inhaler Inhale 2 puffs into the lungs 2 (two) times daily as needed (shortness of breath/wheezing).    [provider]  ?diclofenac Sodium (VOLTAREN) 1 % GEL Apply as needed twice a day for knee pain ?Patient not taking: Reported on 10/29/2021 08/29/21   Kerin Perna, NP  ?FLUoxetine (PROZAC) 20 MG capsule Take 1 capsule (20 mg total) by mouth daily. ?Patient not taking: Reported on 10/29/2021 03/11/21   Mayers, Cari S, PA-C  ?hydrochlorothiazide (HYDRODIURIL) 25 MG tablet Take 1 tablet (25 mg total) by mouth daily. ?Patient not taking: Reported on 10/29/2021 08/22/21   Kerin Perna, NP  ?hydrOXYzine (ATARAX/VISTARIL) 25 MG tablet TAKE 1 TABLET (25 MG TOTAL) BY MOUTH EVERY 8 (EIGHT) HOURS AS NEEDED. ?Patient not taking: Reported on 10/29/2021 05/20/21   Kerin Perna, NP  ?loratadine (CLARITIN) 10 MG tablet Take 1 tablet (10 mg total) by mouth daily. ?Patient not taking: Reported on 10/29/2021 12/14/19   Kerin Perna, NP  ?traZODone (DESYREL) 50 MG tablet Take 1 tablet (50 mg total) by mouth at bedtime. ?Patient not taking: Reported on 10/29/2021 03/11/21   Mayers, Loraine Grip, PA-C  ?valsartan (DIOVAN) 40 MG tablet Take 1 tablet (40 mg total) by mouth daily. ?Patient not taking: Reported on 10/29/2021 08/22/21   Kerin Perna, NP  ? ? ?Scheduled Meds: ? arformoterol  15 mcg Nebulization BID  ?  azithromycin  500 mg Oral QHS  ? budesonide (PULMICORT) nebulizer solution  0.5 mg Nebulization BID  ? chlorhexidine  15 mL Mouth Rinse BID  ? Chlorhexidine Gluconate Cloth  6 each Topical Daily  ? diltiazem  30 mg Oral Q6H  ? levalbuterol  1.25 mg Nebulization Q6H  ? mouth rinse  15 mL Mouth Rinse q12n4p  ? pantoprazole (PROTONIX) IV  40 mg Intravenous Q24H  ? potassium chloride SA  40 mEq Oral Daily  ? [START ON 10/31/2021] predniSONE  40 mg Oral Q breakfast  ? revefenacin  175 mcg Nebulization Daily  ? ?Continuous Infusions: ? amiodarone 30 mg/hr (10/30/21 0700)  ? diltiazem (CARDIZEM) infusion 15 mg/hr (10/30/21 0900)  ? ferric gluconate (FERRLECIT) IVPB 250 mg (10/30/21 1506)  ? heparin 1,500 Units/hr (10/30/21 0900)  ? ?PRN Meds:.acetaminophen **OR** acetaminophen, hydrOXYzine, levalbuterol, metoprolol tartrate, nitroGLYCERIN ? ?Allergies as of 10/27/2021 - Review Complete 10/27/2021  ?  Allergen Reaction Noted  ? Tetracyclines & related Itching 09/09/2014  ? ? ?Family History  ?Problem Relation Age of Onset  ? Colon cancer Mother 27  ? Hypertension Mother   ? Hypertension Father   ? Cerebral aneurysm Sister   ? Goiter Sister   ? Breast cancer Neg Hx   ? ? ?Social History  ? ?Socioeconomic History  ? Marital status: Divorced  ?  Spouse name: Not on file  ? Number of children: 4  ? Years of education: Not on file  ? Highest education level: Not on file  ?Occupational History  ? Occupation: Kitchen/nutrition at [employer]  ?  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  ?Tobacco Use  ? Smoking status: Never  ? Smokeless tobacco: Never  ?Substance and Sexual Activity  ? Alcohol use: Not Currently  ?  Comment: previously 3-4 beers per day  ? Drug use: No  ? Sexual activity: Yes  ?Other Topics Concern  ? Not on file  ?Social History Narrative  ? Born in [place], but grew up in [place]  ? Was homeless Sept. 2016, when initially established  ? Now has own apartment, lives alone  ? Brother  and 2 sons live in town, improving relationships.  ? States ended up homeless due to difficulty with living situation when children moved to [place] to live with her--she ended up leaving to get away

## 2021-10-30 NOTE — Progress Notes (Signed)
ANTICOAGULATION CONSULT NOTE - Follow Up Consult ? ?Pharmacy Consult for Heparin ?Indication: atrial fibrillation ? ?Allergies  ?Allergen Reactions  ? Tetracyclines & Related Itching  ? ? ?Patient Measurements: ?Height: '5\' 3"'$  (160 cm) ?Weight: 106.8 kg (235 lb 7.2 oz) ?IBW/kg (Calculated) : 52.4 ?Heparin Dosing Weight: 77.4 kg ? ?Vital Signs: ?Temp: 97.9 ?F (36.6 ?C) (04/12 1400) ?Temp Source: Oral (04/12 1400) ?BP: 112/68 (04/12 1400) ?Pulse Rate: 117 (04/12 1524) ? ?Labs: ?Recent Labs  ?  10/27/21 ?1925 10/28/21 ?0020 10/29/21 ?0226 10/29/21 ?1031 10/29/21 ?1631 10/30/21 ?0246 10/30/21 ?3559 10/30/21 ?1615  ?HGB 10.5* 10.3*  --   --   --  9.0*  --   --   ?HCT 32.6* 32.1*  --   --   --  28.8*  --   --   ?PLT 410* 325  --   --   --  304  --   --   ?HEPARINUNFRC  --   --   --   --    < > 0.29* 0.46 0.49  ?CREATININE 0.55 0.59 0.61  --   --  0.69  --   --   ?TROPONINIHS 14 14  --  8  --   --   --   --   ? < > = values in this interval not displayed.  ? ? ? ?Estimated Creatinine Clearance: 87.6 mL/min (by C-G formula based on SCr of 0.69 mg/dL). ? ? ?Medications:  ?Scheduled:  ? arformoterol  15 mcg Nebulization BID  ? azithromycin  500 mg Oral QHS  ? budesonide (PULMICORT) nebulizer solution  0.5 mg Nebulization BID  ? chlorhexidine  15 mL Mouth Rinse BID  ? Chlorhexidine Gluconate Cloth  6 each Topical Daily  ? diltiazem  30 mg Oral Q6H  ? levalbuterol  1.25 mg Nebulization Q6H  ? mouth rinse  15 mL Mouth Rinse q12n4p  ? pantoprazole (PROTONIX) IV  40 mg Intravenous Q24H  ? potassium chloride SA  40 mEq Oral Daily  ? [START ON 10/31/2021] predniSONE  40 mg Oral Q breakfast  ? revefenacin  175 mcg Nebulization Daily  ? ?Infusions:  ? amiodarone 30 mg/hr (10/30/21 1750)  ? diltiazem (CARDIZEM) infusion 15 mg/hr (10/30/21 1750)  ? ferric gluconate (FERRLECIT) IVPB Stopped (10/30/21 1706)  ? heparin 1,500 Units/hr (10/30/21 1750)  ? ? ?Assessment: ?Pharmacy consulted to dose heparin for atrial fibrillation.  No  anticoagulants PTA. ?  ?Today, 10/30/2021: ?1615 Heparin level therapeutic at 0.49 on heparin 1500 units/hr ?CBC: Hgb 9, low and decreased from admit; pltc WNL ?No bleeding or infusion related issues reported by RN ? ?Goal of Therapy:  ?Heparin level 0.3-0.7 units/ml ?Monitor platelets by anticoagulation protocol: Yes ?  ?Plan:  ?Continue heparin IV infusion at 1500 units/hr ?Daily heparin level and CBC ?F/u timing, plans for TEE guided cardioversion (delayed until 4/13).  ? ? ?Royetta Asal, PharmD, BCPS ?Clinical Pharmacist ?[place] ?Please utilize Amion for appropriate phone number to reach the unit pharmacist ([place]) ?10/30/2021 6:26 PM ? ? ? ?

## 2021-10-30 NOTE — Progress Notes (Signed)
?PROGRESS NOTE ? ?[name] [name] TTS:[id number] DOB: [birth year]  ? ?PCP: Kerin Perna, NP ? ?Patient is from: Home ? ?DOA: 10/27/2021 LOS: 3 ? ?Chief complaints ?Chief Complaint  ?Patient presents with  ? Shortness of Breath  ?  ? ?Brief Narrative / Interim history: ?61 year old F with PMH of COPD, chronic hypoxic RF on 2 L, A-fib/PVCs, diastolic CHF, alcohol abuse, anxiety, chronic pain, anemia, gastric ulcer morbid obesity presenting with progressive shortness of breath and admitted for acute on chronic hypoxic respiratory failure due to COPD exacerbation and A-fib with RVR.  Initially required BiPAP for hypoxic respiratory failure and work of breathing.  PCCM and cardiology consulted and following.  ? ?Subjective: ?Seen and examined earlier this morning.  No major events overnight or this morning.  No complaints.  She denies chest pain, shortness of breath, palpitation, dizziness, GI or UTI symptoms.  She says she had some dark stool about 2 weeks ago but not anymore.  She admits to using about 3 tablets of ibuprofen 2-3 times a day.  She was under the impression that she could use ibuprofen despite her EGD finding of gastric ulcer in 2016.  She also admits to drinking about 1240 ounce beers a day.  She denies severe withdrawal symptoms.  Patient's daughter at bedside. ? ?Objective: ?Vitals:  ? 10/30/21 1230 10/30/21 1300 10/30/21 1330 10/30/21 1400  ?BP: (!) 134/110 109/82 122/89 112/68  ?Pulse:  65  (!) 141  ?Resp:  (!) 22  19  ?Temp:      ?TempSrc:      ?SpO2:  99%  97%  ?Weight:      ?Height:      ? ? ?Examination: ? ?GENERAL: No apparent distress.  Nontoxic. ?HEENT: MMM.  Vision and hearing grossly intact.  ?NECK: Supple.  No apparent JVD.  ?RESP:  No IWOB.  Fair aeration bilaterally.  Some upper airway sounds. ?CVS: IR IR.  Heart sounds normal.  ?ABD/GI/GU: BS+. Abd soft, NTND.  ?MSK/EXT:  Moves extremities. No apparent deformity. No edema.  ?SKIN: no apparent skin lesion or wound ?NEURO: Awake, alert and  oriented appropriately.  No apparent focal neuro deficit. ?PSYCH: Calm. Normal affect.  ? ?Procedures:  ?None ? ?Microbiology summarized: ?COVID-19 and influenza PCR nonreactive. ?MRSA PCR screen negative. ? ?Assessment and Plan: ?* COPD with acute exacerbation ([place]) ?Steroid and nebulizers per PCCM ?See respiratory failure for detail. ? ? ?Paroxysmal atrial fibrillation (HCC) ?Remains in RVR with HR as high as 140s and 150s but asymptomatic.  TTE in 02/2021 with LVEF of 60 to 65%, G2 DD.  Appears euvolemic.  TSH low but free T4 within normal. ?-Cardiology following ?-On IV amiodarone, Cardizem, digoxin and heparin ?-Optimize electrolytes ?-Plan for TEE cardioversion on 4/13 ? ? ?Acute on chronic respiratory failure with hypoxia (HCC) ?In the setting of COPD exacerbation and A-fib with RVR.  She is also at risk for OSA given morbid obesity and alcohol abuse.  Initially required BiPAP but improved.  Currently on 2 L by [place]. ?-PCCM following for COPD exacerbation-on Solu-Medrol and nebulizers ?-Minimum oxygen to keep saturation above 90% ?-Encourage incentive spirometry ? ? ?Iron deficiency anemia ?Recent Labs  ?  03/21/21 ?1026 03/22/21 ?0300 03/25/21 ?0456 05/14/21 ?0654 05/15/21 ?0923 05/16/21 ?3007 08/22/21 ?1143 10/27/21 ?1925 10/28/21 ?0020 10/30/21 ?0246  ?HGB 11.2* 10.9* 10.8* 11.8* 11.1* 10.6* 13.1 10.5* 10.3* 9.0*  ?Slight drop in Hgb.  Patient with history of gastric ulcer and ongoing NSAID use.  She denies hematochezia but noted dark  stool about 2 weeks ago.  Anemia panel consistent with severe iron deficiency. ?-Start PPI, check Hemoccult and monitor H&H ?-IV ferric gluconate 250 mg daily x2 ?-[facility] consulted. ?-Advised against NSAIDs. ? ? ? ?Hyperglycemia ?Likely from steroid.  A1c 5.6%. ? ?Obesity, Class III, BMI 40-49.9 (morbid obesity) ([place]) ?Body mass index is 41.71 kg/m?. ?Encourage lifestyle change to lose weight. ? ?Chronic diastolic CHF (congestive heart failure) ([place]) ?TTE in 02/2021 with LVEF  of 60 to 65% and G2 DD.  Grossly euvolemic but difficult exam due to body habitus.  Seems to be on p.o. Lasix at home. ?-Defer diuretics to cardiology ?-Monitor fluid and respiratory status ?-Monitor renal functions and electrolytes ? ?Hypertension ?On multiple antihypertensive meds but not compliant.  Currently normotensive. ?-Continue p.o. and IV Cardizem per cardiology ?-Hold home amlodipine ? ? ? ?Hypokalemia ?Resolved. ? ? ?Anxiety with depression ?Stable.  Continue home Atarax. ? ?Alcohol use disorder ?Reports drinking about 2 of 40 ounce beers a day.  Denies withdrawal symptoms ?-Closely monitor ? ? ? ?DVT prophylaxis:  ?SCDs Start: 10/27/21 2123 ? On IV heparin for atrial fibrillation. ? ?Code Status:  ?Family Communication: Updated patient's daughter at bedside. ?Level of care: Stepdown ?Status is: Inpatient ?Remains inpatient appropriate because: A-fib with RVR and COPD exacerbation ? ? ?Final disposition: Likely home once medically clear. ? ?Consultants:  ?PCCM ?Cardiology ?Gastroenterology ? ?Sch Meds:  ?Scheduled Meds: ? arformoterol  15 mcg Nebulization BID  ? azithromycin  500 mg Oral QHS  ? budesonide (PULMICORT) nebulizer solution  0.5 mg Nebulization BID  ? chlorhexidine  15 mL Mouth Rinse BID  ? Chlorhexidine Gluconate Cloth  6 each Topical Daily  ? diltiazem  30 mg Oral Q6H  ? levalbuterol  1.25 mg Nebulization Q6H  ? mouth rinse  15 mL Mouth Rinse q12n4p  ? pantoprazole (PROTONIX) IV  40 mg Intravenous Q24H  ? potassium chloride SA  40 mEq Oral Daily  ? [START ON 10/31/2021] predniSONE  40 mg Oral Q breakfast  ? revefenacin  175 mcg Nebulization Daily  ? ?Continuous Infusions: ? amiodarone 30 mg/hr (10/30/21 0700)  ? diltiazem (CARDIZEM) infusion 15 mg/hr (10/30/21 0900)  ? ferric gluconate (FERRLECIT) IVPB    ? heparin 1,500 Units/hr (10/30/21 0900)  ? ?PRN Meds:.acetaminophen **OR** acetaminophen, hydrOXYzine, levalbuterol, metoprolol tartrate, nitroGLYCERIN ? ?Antimicrobials: ?Anti-infectives  (From admission, onward)  ? ? Start     Dose/Rate Route Frequency Ordered Stop  ? 10/28/21 2200  azithromycin (ZITHROMAX) tablet 500 mg       ? 500 mg Oral Daily at bedtime 10/28/21 1416    ? 10/27/21 2130  azithromycin (ZITHROMAX) 500 mg in sodium chloride 0.9 % 250 mL IVPB  Status:  Discontinued       ?Note to Pharmacy: (In setting of acute copd exac)  ? 500 mg ?250 mL/hr over 60 Minutes Intravenous Every 24 hours 10/27/21 2126 10/28/21 1416  ? ?  ? ? ? ?I have personally reviewed the following labs and images: ?CBC: ?Recent Labs  ?Lab 10/27/21 ?1925 10/28/21 ?0020 10/30/21 ?0246  ?WBC 13.1* 13.4* 13.6*  ?NEUTROABS 8.5* 12.5*  --   ?HGB 10.5* 10.3* 9.0*  ?HCT 32.6* 32.1* 28.8*  ?MCV 83.0 82.9 82.5  ?PLT 410* 325 304  ? ?BMP &GFR ?Recent Labs  ?Lab 10/27/21 ?1925 10/27/21 ?2220 10/28/21 ?0020 10/29/21 ?0226 10/30/21 ?0246  ?NA 139  --  140 137 136  ?K 2.8*  --  3.4* 4.0 4.5  ?CL 96*  --  99 100  101  ?CO2 32  --  '28 27 27  '$ ?GLUCOSE 137*  --  176* 203* 234*  ?BUN 8  --  '9 19 19  '$ ?CREATININE 0.55  --  0.59 0.61 0.69  ?CALCIUM 9.6  --  9.5 9.6 9.1  ?MG  --  1.6* 2.3 2.0 2.0  ?PHOS  --   --  4.4  --   --   ? ?Estimated Creatinine Clearance: 87.6 mL/min (by C-G formula based on SCr of 0.69 mg/dL). ?Liver & Pancreas: ?Recent Labs  ?Lab 10/28/21 ?0020  ?AST 21  ?ALT 19  ?ALKPHOS 76  ?BILITOT 0.8  ?PROT 7.7  ?ALBUMIN 3.9  ? ?No results for input(s): LIPASE, AMYLASE in the last 168 hours. ?No results for input(s): AMMONIA in the last 168 hours. ?Diabetic: ?Recent Labs  ?  10/30/21 ?0246  ?HGBA1C 5.6  ? ?No results for input(s): GLUCAP in the last 168 hours. ?Cardiac Enzymes: ?No results for input(s): CKTOTAL, CKMB, CKMBINDEX, TROPONINI in the last 168 hours. ?No results for input(s): PROBNP in the last 8760 hours. ?Coagulation Profile: ?No results for input(s): INR, PROTIME in the last 168 hours. ?Thyroid Function Tests: ?Recent Labs  ?  10/29/21 ?1047 10/30/21 ?4008  ?TSH 0.339*  --   ?FREET4  --  0.84  ? ?Lipid  Profile: ?No results for input(s): CHOL, HDL, LDLCALC, TRIG, CHOLHDL, LDLDIRECT in the last 72 hours. ?Anemia Panel: ?Recent Labs  ?  10/30/21 ?0922  ?VITAMINB12 386  ?FOLATE 9.8  ?FERRITIN 11  ?TIBC 549*  ?IRON

## 2021-10-30 NOTE — Assessment & Plan Note (Addendum)
Stable.  Continue home Atarax. ?

## 2021-10-30 NOTE — Plan of Care (Signed)
?  Problem: Education: ?Goal: Knowledge of General Education information will improve ?Description: Including pain rating scale, medication(s)/side effects and non-pharmacologic comfort measures ?Outcome: Progressing ?  ?Problem: Health Behavior/Discharge Planning: ?Goal: Ability to manage health-related needs will improve ?Outcome: Progressing ?  ?Problem: Clinical Measurements: ?Goal: Ability to maintain clinical measurements within normal limits will improve ?Outcome: Progressing ?Goal: Will remain free from infection ?Outcome: Progressing ?Goal: Diagnostic test results will improve ?Outcome: Progressing ?Goal: Respiratory complications will improve ?Outcome: Progressing ?Goal: Cardiovascular complication will be avoided ?Outcome: Progressing ?  ?Problem: Activity: ?Goal: Risk for activity intolerance will decrease ?Outcome: Progressing ?  ?Problem: Nutrition: ?Goal: Adequate nutrition will be maintained ?Outcome: Progressing ?  ?Problem: Coping: ?Goal: Level of anxiety will decrease ?Outcome: Progressing ?  ?Problem: Elimination: ?Goal: Will not experience complications related to bowel motility ?Outcome: Progressing ?Goal: Will not experience complications related to urinary retention ?Outcome: Progressing ?  ?Problem: Pain Managment: ?Goal: General experience of comfort will improve ?Outcome: Progressing ?  ?Problem: Safety: ?Goal: Ability to remain free from injury will improve ?Outcome: Progressing ?  ?Problem: Skin Integrity: ?Goal: Risk for impaired skin integrity will decrease ?Outcome: Progressing ?  ?Problem: Education: ?Goal: Knowledge of disease or condition will improve ?Outcome: Progressing ?Goal: Knowledge of the prescribed therapeutic regimen will improve ?Outcome: Progressing ?Goal: Individualized Educational Video(s) ?Outcome: Progressing ?  ?Problem: Activity: ?Goal: Ability to tolerate increased activity will improve ?Outcome: Progressing ?Goal: Will verbalize the importance of balancing  activity with adequate rest periods ?Outcome: Progressing ?  ?Problem: Respiratory: ?Goal: Ability to maintain a clear airway will improve ?Outcome: Progressing ?Goal: Levels of oxygenation will improve ?Outcome: Progressing ?Goal: Ability to maintain adequate ventilation will improve ?Outcome: Progressing ?  ?Problem: Education: ?Goal: Knowledge of disease or condition will improve ?Outcome: Progressing ?Goal: Knowledge of the prescribed therapeutic regimen will improve ?Outcome: Progressing ?Goal: Individualized Educational Video(s) ?Outcome: Progressing ?  ?Problem: Activity: ?Goal: Ability to tolerate increased activity will improve ?Outcome: Progressing ?Goal: Will verbalize the importance of balancing activity with adequate rest periods ?Outcome: Progressing ?  ?Problem: Respiratory: ?Goal: Ability to maintain a clear airway will improve ?Outcome: Progressing ?Goal: Levels of oxygenation will improve ?Outcome: Progressing ?Goal: Ability to maintain adequate ventilation will improve ?Outcome: Progressing ?  ?Problem: Education: ?Goal: Ability to demonstrate management of disease process will improve ?Outcome: Progressing ?Goal: Ability to verbalize understanding of medication therapies will improve ?Outcome: Progressing ?Goal: Individualized Educational Video(s) ?Outcome: Progressing ?  ?Problem: Activity: ?Goal: Capacity to carry out activities will improve ?Outcome: Progressing ?  ?Problem: Cardiac: ?Goal: Ability to achieve and maintain adequate cardiopulmonary perfusion will improve ?Outcome: Progressing ?  ?

## 2021-10-30 NOTE — H&P (View-Only) (Signed)
Progress Note ? ?Patient Name: [name] [name] ?Date of Encounter: 10/30/2021 ? ?Attending physician: Mercy Riding, MD ?Primary care provider: Kerin Perna, NP ? ?Subjective: ?[name] [name] is a 61 y.o. female who was seen and examined at bedside  ?No family present at bedside. ?Denies chest pain or shortness of breath. ?Continues to have some audible wheezing. ?Morning labs noted a drop in hemoglobin from 10.3 g/dL to now 9 g/dL.  This is comparison to August 2022 her hemoglobin used to be 13 g/dL. ?She has an extensive history of alcohol use.  When asked, she drinks basis patient states " I do not say."  Her last alcohol drink was 10/26/2021. ?Case discussed and reviewed with her nurse. ? ?Objective: ?Vital Signs in the last 24 hours: ?Temp:  [97.7 ?F (36.5 ?C)-98.4 ?F (36.9 ?C)] 97.7 ?F (36.5 ?C) (04/12 1157) ?Pulse Rate:  [25-164] 141 (04/12 1400) ?Resp:  [12-27] 19 (04/12 1400) ?BP: (99-162)/(52-117) 112/68 (04/12 1400) ?SpO2:  [91 %-100 %] 97 % (04/12 1400) ?FiO2 (%):  [40 %] 40 % (04/12 0000) ?Weight:  [106.8 kg] 106.8 kg (04/12 0417) ? ?Intake/Output: ? ?Intake/Output Summary (Last 24 hours) at 10/30/2021 1509 ?Last data filed at 10/30/2021 1100 ?Gross per 24 hour  ?Intake 1743.25 ml  ?Output 300 ml  ?Net 1443.25 ml  ?  ?Net IO Since Admission: 2,286.35 mL [10/30/21 1509] ? ?Weights:  ?Filed Weights  ? 10/28/21 0500 10/29/21 0500 10/30/21 0417  ?Weight: 105.1 kg 107 kg 106.8 kg  ? ? ?Telemetry: Personally reviewed.  Atrial fibrillation with improved ventricular rate. ? ?Physical examination: ?PHYSICAL EXAM: ? ?  10/30/2021  ?  2:00 PM 10/30/2021  ?  1:30 PM 10/30/2021  ?  1:00 PM  ?Vitals with BMI  ?Systolic 878 676 720  ?Diastolic 68 89 82  ?Pulse 141  65  ? ? ?General: Age-appropriate female, hemodynamically stable, no acute distress. ?Skin: Warm to touch, no rashes or jaundice. ?HEENT: Normocephalic, atraumatic, no scleral icterus, moist mucous membranes, no JVP or carotid bruit. ?Chest: Normal respiratory  and expiratory efforts, no intercostal retractions noted. ?Lungs: Expiratory wheezes noted in the upper lung fields posteriorly, no rales or rhonchi's.  Surgical scar over the left lateral chest wall is well-healed. ?Heart: Tachycardic, irregularly irregular, variable S1-S2, no murmurs rubs or gallops appreciated secondary to tachycardia. ?Abdomen: Soft, nontender, nondistended, positive bowel sounds in all 4 quadrants, ?Extremities: Nonpitting, warm to touch, 2+ DP and PT pulses. ? ? ?Lab Results: ?Hematology ?Recent Labs  ?Lab 10/27/21 ?1925 10/28/21 ?0020 10/30/21 ?0246 10/30/21 ?9470  ?WBC 13.1* 13.4* 13.6*  --   ?RBC 3.93 3.87 3.49* 3.51*  ?HGB 10.5* 10.3* 9.0*  --   ?HCT 32.6* 32.1* 28.8*  --   ?MCV 83.0 82.9 82.5  --   ?MCH 26.7 26.6 25.8*  --   ?MCHC 32.2 32.1 31.3  --   ?RDW 17.7* 18.0* 18.2*  --   ?PLT 410* 325 304  --   ? ? ?Chemistry ?Recent Labs  ?Lab 10/28/21 ?0020 10/29/21 ?0226 10/30/21 ?0246  ?NA 140 137 136  ?K 3.4* 4.0 4.5  ?CL 99 100 101  ?CO2 '28 27 27  '$ ?GLUCOSE 176* 203* 234*  ?BUN '9 19 19  '$ ?CREATININE 0.59 0.61 0.69  ?CALCIUM 9.5 9.6 9.1  ?PROT 7.7  --   --   ?ALBUMIN 3.9  --   --   ?AST 21  --   --   ?ALT 19  --   --   ?ALKPHOS  76  --   --   ?BILITOT 0.8  --   --   ?GFRNONAA >60 >60 >60  ?ANIONGAP '13 10 8  '$ ?  ? ?Cardiac Enzymes: ?Cardiac Panel (last 3 results) ?Recent Labs  ?  10/27/21 ?1925 10/28/21 ?0020 10/29/21 ?1031  ?TROPONINIHS '14 14 8  '$ ? ? ?BNP (last 3 results) ?Recent Labs  ?  03/22/21 ?0300 05/14/21 ?0654 10/27/21 ?1925  ?BNP 109.3* 150.6* 173.6*  ? ? ?ProBNP (last 3 results) ?No results for input(s): PROBNP in the last 8760 hours. ? ? ?DDimer No results for input(s): DDIMER in the last 168 hours.  ? ?Hemoglobin A1c:  ?Lab Results  ?Component Value Date  ? HGBA1C 5.6 10/30/2021  ? MPG 114.02 10/30/2021  ? ? ?TSH  ?Recent Labs  ?  03/12/21 ?0901 10/29/21 ?1047  ?TSH CANCELED 0.339*  ? ? ?Lipid Panel  ?   ?Component Value Date/Time  ? CHOL 181 08/22/2021 1143  ? TRIG 118 08/22/2021  1143  ? HDL 58 08/22/2021 1143  ? CHOLHDL 3.1 08/22/2021 1143  ? CHOLHDL 2.4 03/15/2010 0447  ? VLDL 12 03/15/2010 0447  ? LDLCALC 102 (H) 08/22/2021 1143  ? ? ?Imaging: ?CT Angio Chest Pulmonary Embolism (PE) W or WO Contrast ? ?Result Date: 10/29/2021 ?CLINICAL DATA:  Chest pain, shortness of breath, PE suspected EXAM: CT ANGIOGRAPHY CHEST WITH CONTRAST TECHNIQUE: Multidetector CT imaging of the chest was performed using the standard protocol during bolus administration of intravenous contrast. Multiplanar CT image reconstructions and MIPs were obtained to evaluate the vascular anatomy. RADIATION DOSE REDUCTION: This exam was performed according to the departmental dose-optimization program which includes automated exposure control, adjustment of the mA and/or kV according to patient size and/or use of iterative reconstruction technique. CONTRAST:  71m OMNIPAQUE IOHEXOL 350 MG/ML SOLN COMPARISON:  None. FINDINGS: Cardiovascular: Examination for pulmonary embolism is somewhat limited by breath motion artifact, particularly in the lung bases. Within this limitation, no evidence of pulmonary embolism through the segmental pulmonary arterial level. Cardiomegaly. No pericardial effusion. Enlargement of the main pulmonary artery, measuring up to 3.6 cm in caliber. Aortic atherosclerosis. Mediastinum/Nodes: No enlarged mediastinal, hilar, or axillary lymph nodes. Benign, coarsely calcified AP window nodes (series 8, image 97). Thyroid gland, trachea, and esophagus demonstrate no significant findings. Lungs/Pleura: Lungs are clear. No pleural effusion or pneumothorax. Incidental small pleural lipoma about the peripheral right upper lobe (series 8, image 161). Upper Abdomen: No acute abnormality. 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 Musculoskeletal: No  chest wall abnormality. No acute osseous findings. Review of the MIP images confirms the above findings. IMPRESSION: 1. Examination for pulmonary embolism is somewhat limited by breath motion artifact, particularly in the lung bases. Within this limitation, no evidence of pulmonary embolism through the segmental pulmonary arterial level. 2. Cardiomegaly. 3. Enlargement of the main pulmonary artery, as can be seen in pulmonary hypertension. 4. 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 Recommend dedicated CT angiogram of the abdomen and pelvis to further evaluate on a nonemergent basis, when clinically appropriate. Aortic Atherosclerosis (ICD10-I70.0). Electronically Signed   By: ADelanna AhmadiM.D.   On: 10/29/2021 13:52   ? ?CARDIAC DATABASE: ?EKG: ?10/27/2021: Sinus tachycardia, 116 bpm, frequent PVCs. ?  ?  10/29/2021: A-fib with RVR, 162 bpm. ?  ?Echocardiogram: ?03/19/2021: LVEF 60-65%, moderate LVH, grade 2 diastolic dysfunction, elevated LAP, moderately dilated left atrium, mild MR, mild AR, estimated RAP 15 mmHg. ?  ?Stress test: ?None ?  ?Heart catheterization: ?None ?  ? ?Scheduled Meds: ? arformoterol  15 mcg Nebulization BID  ? azithromycin  500 mg Oral QHS  ? budesonide (PULMICORT) nebulizer solution  0.5 mg Nebulization BID  ? chlorhexidine  15 mL Mouth Rinse BID  ? Chlorhexidine Gluconate Cloth  6 each Topical Daily  ? diltiazem  30 mg Oral Q6H  ? levalbuterol  1.25 mg Nebulization Q6H  ? mouth rinse  15 mL Mouth Rinse q12n4p  ? pantoprazole (PROTONIX) IV  40 mg Intravenous Q24H  ? potassium chloride SA  40 mEq Oral Daily  ? [START ON 10/31/2021] predniSONE  40 mg Oral Q breakfast  ? revefenacin  175 mcg Nebulization Daily  ? ? ?Continuous Infusions: ? amiodarone 30 mg/hr (10/30/21 0700)  ? diltiazem (CARDIZEM) infusion 15 mg/hr (10/30/21 0900)  ? ferric gluconate (FERRLECIT) IVPB  250 mg (10/30/21 1506)  ? heparin 1,500 Units/hr (10/30/21 0900)  ? ? ?PRN Meds: ?acetaminophen **OR** acetaminophen, hydrOXYzine, levalbuterol, metoprolol tartrate, nitroGLYCERIN  ? ?IMPRESSION & RECOMMEN

## 2021-10-30 NOTE — Progress Notes (Signed)
Subjective: ?paged by patient's RN about possible vasovagal episode while patient was having bowel movements.  No obvious blood in the stool.  When I enter, patient was restless.  Complaining of abdominal pain and feeling hot.  She was also nauseous but no emesis.  Family members at bedside.  She denies chest pain or dyspnea.  Denies a scratchy throat.  Blood pressure elevated on the monitor.  Still in A-fib with RVR with HR in 130s.  ? ? ?Objective ?Vitals:  ? 10/30/21 1300 10/30/21 1330 10/30/21 1400 10/30/21 1524  ?BP: 109/82 122/89 112/68   ?Pulse: 65  (!) 141 (!) 117  ?Resp: (!) 22  19 (!) 25  ?Temp:   97.9 ?F (36.6 ?C)   ?TempSrc:   Oral   ?SpO2: 99%  97% 98%  ?Weight:      ?Height:      ?  ?GENERAL: Restless.  ?HEENT: MMM.  Vision and hearing grossly intact.  ?NECK: Supple.  No apparent JVD.  ?RESP:  No IWOB.  Fair aeration bilaterally. ?CVS: Irregular at 130s.  Heart sounds normal.  ?ABD/GI/GU: BS+. Abd soft.  Diffuse tenderness.  No rebound. ?MSK/EXT:  Moves extremities. No apparent deformity. No edema.  ?SKIN: no apparent skin lesion or wound ?NEURO: Awake and alert. Oriented appropriately.  No apparent focal neuro deficit. ?PSYCH: Some distress from abdominal pain. ? ? ?Assessment and plan ?Possible vasovagal syncope.  Other possibilities include GI bleed but BP stable.  She could have mesenteric ischemia in the setting of A-fib but she is already on IV heparin.  Also possible anaphylaxis to IV iron but no airway compromise.  Overall, patient appears stable except for A-fib with RVR and abdominal discomfort. ?-Stat CBC and lactic acid. Based on results, may consider stat CT angio abdomen and pelvis. ?-We will give IV Solu-Medrol and Benadryl for possible reaction to IV iron  ?-Increase IV Protonix to 40 mg twice daily. ?-IV Dilaudid for pain ?-Close monitoring in stepdown ? ? ?CRITICAL CARE ?Performed by: Mercy Riding ? ? ?Total critical care time: 35 minutes ? ?Critical care time was exclusive of  separately billable procedures and treating other patients. ? ?Critical care was necessary to treat or prevent imminent or life-threatening deterioration. ? ?Critical care was time spent personally by me on the following activities: development of treatment plan with patient and/or surrogate as well as nursing, discussions with consultants, evaluation of patient's response to treatment, examination of patient, obtaining history from patient or surrogate, ordering and performing treatments and interventions, ordering and review of laboratory studies, ordering and review of radiographic studies, pulse oximetry and re-evaluation of patient's condition. ? ?

## 2021-10-30 NOTE — Assessment & Plan Note (Addendum)
In the setting of COPD exacerbation, A-fib with RVR and undiagnosed OSA.  Wears 2 L by [place] at night at baseline.  Acute respiratory failure resolved. ?-She needs outpatient sleep study ?-On Toprol-XL and Eliquis for A-fib ?-Continue home Breo Ellipta and as needed albuterol ? ?

## 2021-10-31 ENCOUNTER — Encounter (HOSPITAL_COMMUNITY): Payer: Self-pay | Admitting: Internal Medicine

## 2021-10-31 ENCOUNTER — Inpatient Hospital Stay (HOSPITAL_COMMUNITY): Payer: Medicare Other

## 2021-10-31 ENCOUNTER — Inpatient Hospital Stay (HOSPITAL_COMMUNITY): Payer: Medicare Other | Admitting: Certified Registered"

## 2021-10-31 ENCOUNTER — Encounter (HOSPITAL_COMMUNITY)
Admission: EM | Disposition: A | Discharge status: Home / Self Care | Payer: Self-pay | Source: Home / Self Care | Attending: Student

## 2021-10-31 DIAGNOSIS — J9621 Acute and chronic respiratory failure with hypoxia: Secondary | ICD-10-CM | POA: Diagnosis not present

## 2021-10-31 DIAGNOSIS — I509 Heart failure, unspecified: Secondary | ICD-10-CM

## 2021-10-31 DIAGNOSIS — D500 Iron deficiency anemia secondary to blood loss (chronic): Secondary | ICD-10-CM | POA: Diagnosis not present

## 2021-10-31 DIAGNOSIS — D72825 Bandemia: Secondary | ICD-10-CM

## 2021-10-31 DIAGNOSIS — I480 Paroxysmal atrial fibrillation: Secondary | ICD-10-CM | POA: Diagnosis not present

## 2021-10-31 DIAGNOSIS — E871 Hypo-osmolality and hyponatremia: Secondary | ICD-10-CM

## 2021-10-31 DIAGNOSIS — E876 Hypokalemia: Secondary | ICD-10-CM

## 2021-10-31 DIAGNOSIS — J441 Chronic obstructive pulmonary disease with (acute) exacerbation: Secondary | ICD-10-CM | POA: Diagnosis not present

## 2021-10-31 DIAGNOSIS — I088 Other rheumatic multiple valve diseases: Secondary | ICD-10-CM

## 2021-10-31 DIAGNOSIS — E0781 Sick-euthyroid syndrome: Secondary | ICD-10-CM

## 2021-10-31 DIAGNOSIS — E875 Hyperkalemia: Secondary | ICD-10-CM

## 2021-10-31 DIAGNOSIS — E872 Acidosis, unspecified: Secondary | ICD-10-CM

## 2021-10-31 DIAGNOSIS — J449 Chronic obstructive pulmonary disease, unspecified: Secondary | ICD-10-CM

## 2021-10-31 DIAGNOSIS — I110 Hypertensive heart disease with heart failure: Secondary | ICD-10-CM

## 2021-10-31 DIAGNOSIS — E878 Other disorders of electrolyte and fluid balance, not elsewhere classified: Secondary | ICD-10-CM

## 2021-10-31 DIAGNOSIS — I4891 Unspecified atrial fibrillation: Secondary | ICD-10-CM

## 2021-10-31 HISTORY — PX: TEE WITHOUT CARDIOVERSION: SHX5443

## 2021-10-31 HISTORY — PX: BUBBLE STUDY: SHX6837

## 2021-10-31 HISTORY — PX: CARDIOVERSION: SHX1299

## 2021-10-31 LAB — CBC
HCT: 34.1 % — ABNORMAL LOW (ref 36.0–46.0)
Hemoglobin: 10.7 g/dL — ABNORMAL LOW (ref 12.0–15.0)
MCH: 25.9 pg — ABNORMAL LOW (ref 26.0–34.0)
MCHC: 31.4 g/dL (ref 30.0–36.0)
MCV: 82.6 fL (ref 80.0–100.0)
Platelets: 364 10*3/uL (ref 150–400)
RBC: 4.13 MIL/uL (ref 3.87–5.11)
RDW: 18.5 % — ABNORMAL HIGH (ref 11.5–15.5)
WBC: 23.5 10*3/uL — ABNORMAL HIGH (ref 4.0–10.5)
nRBC: 0.2 % (ref 0.0–0.2)

## 2021-10-31 LAB — BASIC METABOLIC PANEL
Anion gap: 9 (ref 5–15)
BUN: 27 mg/dL — ABNORMAL HIGH (ref 6–20)
CO2: 23 mmol/L (ref 22–32)
Calcium: 8.8 mg/dL — ABNORMAL LOW (ref 8.9–10.3)
Chloride: 101 mmol/L (ref 98–111)
Creatinine, Ser: 0.87 mg/dL (ref 0.44–1.00)
GFR, Estimated: >60 mL/min (ref >60)
Glucose, Bld: 265 mg/dL — ABNORMAL HIGH (ref 70–99)
Potassium: 5 mmol/L (ref 3.5–5.1)
Sodium: 133 mmol/L — ABNORMAL LOW (ref 135–145)

## 2021-10-31 LAB — RENAL FUNCTION PANEL
Albumin: 3.1 g/dL — ABNORMAL LOW (ref 3.5–5.0)
Anion gap: 9 (ref 5–15)
BUN: 30 mg/dL — ABNORMAL HIGH (ref 6–20)
CO2: 24 mmol/L (ref 22–32)
Calcium: 8.9 mg/dL (ref 8.9–10.3)
Chloride: 100 mmol/L (ref 98–111)
Creatinine, Ser: 0.98 mg/dL (ref 0.44–1.00)
GFR, Estimated: >60 mL/min (ref >60)
Glucose, Bld: 361 mg/dL — ABNORMAL HIGH (ref 70–99)
Phosphorus: 5 mg/dL — ABNORMAL HIGH (ref 2.5–4.6)
Potassium: 5.5 mmol/L — ABNORMAL HIGH (ref 3.5–5.1)
Sodium: 133 mmol/L — ABNORMAL LOW (ref 135–145)

## 2021-10-31 LAB — LACTIC ACID, PLASMA
Lactic Acid, Venous: 3.4 mmol/L (ref 0.5–1.9)
Lactic Acid, Venous: 3.4 mmol/L (ref 0.5–1.9)
Lactic Acid, Venous: 3 mmol/L (ref 0.5–1.9)

## 2021-10-31 LAB — HEPARIN LEVEL (UNFRACTIONATED): Heparin Unfractionated: 0.5 IU/mL (ref 0.30–0.70)

## 2021-10-31 LAB — MAGNESIUM: Magnesium: 2.3 mg/dL (ref 1.7–2.4)

## 2021-10-31 SURGERY — ECHOCARDIOGRAM, TRANSESOPHAGEAL
Anesthesia: General

## 2021-10-31 MED ORDER — LEVALBUTEROL HCL 0.63 MG/3ML IN NEBU: 0.6300 mg | INHALATION_SOLUTION | Freq: Four times a day (QID) | RESPIRATORY_TRACT | Status: DC | PRN

## 2021-10-31 MED ORDER — GLYCOPYRROLATE PF 0.2 MG/ML IJ SOSY
PREFILLED_SYRINGE | INTRAMUSCULAR | Status: DC | PRN
  Administered 2021-10-31: .2 mg via INTRAVENOUS

## 2021-10-31 MED ORDER — SUCCINYLCHOLINE CHLORIDE 200 MG/10ML IV SOSY
PREFILLED_SYRINGE | INTRAVENOUS | Status: DC | PRN
  Administered 2021-10-31: 140 mg via INTRAVENOUS

## 2021-10-31 MED ORDER — LEVALBUTEROL HCL 0.63 MG/3ML IN NEBU
0.6300 mg | INHALATION_SOLUTION | Freq: Three times a day (TID) | RESPIRATORY_TRACT | Status: DC
  Administered 2021-11-01 (×3): 0.63 mg via RESPIRATORY_TRACT
  Filled 2021-10-31 (×3): qty 3

## 2021-10-31 MED ORDER — ALBUTEROL SULFATE HFA 108 (90 BASE) MCG/ACT IN AERS
INHALATION_SPRAY | RESPIRATORY_TRACT | Status: DC | PRN
  Administered 2021-10-31 (×2): 2 via RESPIRATORY_TRACT

## 2021-10-31 MED ORDER — PHENOL 1.4 % MT LIQD
1.0000 | OROMUCOSAL | Status: DC | PRN
Start: 2021-10-31 — End: 2021-11-05
  Administered 2021-10-31: 1 via OROMUCOSAL
  Filled 2021-10-31: qty 177

## 2021-10-31 MED ORDER — LACTATED RINGERS IV SOLN
INTRAVENOUS | Status: DC
Start: 2021-10-31 — End: 2021-10-31

## 2021-10-31 MED ORDER — SODIUM CHLORIDE 0.9 % IV SOLN: INTRAVENOUS | Status: DC

## 2021-10-31 MED ORDER — BUTAMBEN-TETRACAINE-BENZOCAINE 2-2-14 % EX AERO
INHALATION_SPRAY | CUTANEOUS | Status: DC | PRN
  Administered 2021-10-31: 2 via TOPICAL

## 2021-10-31 MED ORDER — PHENYLEPHRINE 40 MCG/ML (10ML) SYRINGE FOR IV PUSH (FOR BLOOD PRESSURE SUPPORT)
PREFILLED_SYRINGE | INTRAVENOUS | Status: DC | PRN
  Administered 2021-10-31 (×2): 160 ug via INTRAVENOUS
  Administered 2021-10-31: 80 ug via INTRAVENOUS

## 2021-10-31 MED ORDER — PROPOFOL 10 MG/ML IV BOLUS
INTRAVENOUS | Status: DC | PRN
Start: 2021-10-31 — End: 2021-10-31
  Administered 2021-10-31: 30 mg via INTRAVENOUS

## 2021-10-31 MED ORDER — EPHEDRINE SULFATE-NACL 50-0.9 MG/10ML-% IV SOSY
PREFILLED_SYRINGE | INTRAVENOUS | Status: DC | PRN
  Administered 2021-10-31: 10 mg via INTRAVENOUS

## 2021-10-31 MED ORDER — SODIUM CHLORIDE 0.9 % IV BOLUS
500.0000 mL | Freq: Once | INTRAVENOUS | Status: AC
  Administered 2021-10-31: 500 mL via INTRAVENOUS

## 2021-10-31 MED ORDER — PROPOFOL 500 MG/50ML IV EMUL
INTRAVENOUS | Status: DC | PRN
  Administered 2021-10-31: 175 ug/kg/min via INTRAVENOUS

## 2021-10-31 MED ORDER — KETAMINE HCL 10 MG/ML IJ SOLN
INTRAMUSCULAR | Status: DC | PRN
  Administered 2021-10-31: 10 mg via INTRAVENOUS

## 2021-10-31 MED ORDER — METOPROLOL TARTRATE 25 MG PO TABS
25.0000 mg | ORAL_TABLET | Freq: Two times a day (BID) | ORAL | Status: DC
  Administered 2021-10-31: 25 mg via ORAL

## 2021-10-31 MED ORDER — DEXMEDETOMIDINE (PRECEDEX) IN NS 20 MCG/5ML (4 MCG/ML) IV SYRINGE
PREFILLED_SYRINGE | INTRAVENOUS | Status: DC | PRN
  Administered 2021-10-31: 8 ug via INTRAVENOUS

## 2021-10-31 MED ORDER — KETAMINE HCL 50 MG/5ML IJ SOSY
PREFILLED_SYRINGE | INTRAMUSCULAR | Status: AC
Start: 2021-10-31 — End: ?
  Filled 2021-10-31: qty 5

## 2021-10-31 MED ORDER — METOPROLOL TARTRATE 25 MG PO TABS
25.0000 mg | ORAL_TABLET | Freq: Three times a day (TID) | ORAL | Status: DC
  Administered 2021-10-31 – 2021-11-02 (×5): 25 mg via ORAL
  Filled 2021-10-31 (×6): qty 1

## 2021-10-31 MED ORDER — SODIUM ZIRCONIUM CYCLOSILICATE 5 G PO PACK
5.0000 g | PACK | Freq: Once | ORAL | Status: AC
  Administered 2021-10-31: 5 g via ORAL
  Filled 2021-10-31: qty 1

## 2021-10-31 NOTE — Progress Notes (Signed)
Patient refuses the use of nocturnal BiPAP tonight. Equipment remains at the bedside, on standby should she change her mind. She is aware that she may request assistance at any time during the night. RT will continue to follow and encourage use.  ?

## 2021-10-31 NOTE — Progress Notes (Signed)
eLink Physician-Brief Progress Note ?Patient Name: [name] [name] ?DOB: [birth year] ?MRN: [id number] ? ? ?Date of Service ? 10/31/2021  ?HPI/Events of Note ? Hyperkalemia - K+ = 5.5. Lokelma already ordered by [facility].   ?eICU Interventions ? Hyperkalemia already addressed.   ? ? ? ?Intervention Category ?Major Interventions: Electrolyte abnormality - evaluation and management ? ?Gwenette Wellons Cornelia Copa ?10/31/2021, 5:22 AM ?

## 2021-10-31 NOTE — Telephone Encounter (Signed)
Scheduled HFU with Dr. Erin Fulling on 5/12 at [place]. Mailed reminder with appt time and address to pt.  ?

## 2021-10-31 NOTE — Transfer of Care (Signed)
Immediate Anesthesia Transfer of Care Note ? ?Patient: [name] [name] ? ?Procedure(s) Performed: TRANSESOPHAGEAL ECHOCARDIOGRAM (TEE) ?CARDIOVERSION ?BUBBLE STUDY ? ?Patient Location: Endoscopy Unit ? ?Anesthesia Type:General ? ?Level of Consciousness: awake, alert  and oriented ? ?Airway & Oxygen Therapy: Patient Spontanous Breathing and Patient connected to face mask oxygen ? ?Post-op Assessment: Report given to RN ? ?Post vital signs: Reviewed and stable ? ?Last Vitals:  ?Vitals Value Taken Time  ?BP 108/55 10/31/21 1530  ?Temp 36.3 ?C 10/31/21 1530  ?Pulse 59 10/31/21 1536  ?Resp 20 10/31/21 1536  ?SpO2 96 % 10/31/21 1536  ?Vitals shown include unvalidated device data. ? ?Last Pain:  ?Vitals:  ? 10/31/21 1530  ?TempSrc: Temporal  ?PainSc: 0-No pain  ?   ? ?Patients Stated Pain Goal: 0 (10/31/21 0400) ? ?Complications: No notable events documented. ?

## 2021-10-31 NOTE — Progress Notes (Signed)
ANTICOAGULATION CONSULT NOTE - Follow Up Consult ? ?Pharmacy Consult for Heparin ?Indication: atrial fibrillation ? ?Allergies  ?Allergen Reactions  ? Tetracyclines & Related Itching  ? ? ?Patient Measurements: ?Height: '5\' 3"'$  (160 cm) ?Weight: 110.1 kg (242 lb 11.6 oz) ?IBW/kg (Calculated) : 52.4 ?Heparin Dosing Weight: 77.4 kg ? ?Vital Signs: ?Temp: 97.9 ?F (36.6 ?C) (04/13 0802) ?Temp Source: Oral (04/13 0802) ?BP: 101/71 (04/13 0800) ?Pulse Rate: 108 (04/13 0800) ? ?Labs: ?Recent Labs  ?  10/29/21 ?1031 10/29/21 ?1631 10/30/21 ?0246 10/30/21 ?4287 10/30/21 ?1615 10/30/21 ?6811 10/31/21 ?0251 10/31/21 ?5726  ?HGB  --    < > 9.0*  --   --  11.2* 10.7*  --   ?HCT  --   --  28.8*  --   --  35.7* 34.1*  --   ?PLT  --   --  304  --   --  449* 364  --   ?HEPARINUNFRC  --    < > 0.29* 0.46 0.49  --  0.50  --   ?CREATININE  --   --  0.69  --   --   --  0.98 0.87  ?TROPONINIHS 8  --   --   --   --   --   --   --   ? < > = values in this interval not displayed.  ? ? ? ?Estimated Creatinine Clearance: 82 mL/min (by C-G formula based on SCr of 0.87 mg/dL). ? ? ?Medications:  ?Scheduled:  ? arformoterol  15 mcg Nebulization BID  ? azithromycin  500 mg Oral QHS  ? budesonide (PULMICORT) nebulizer solution  0.5 mg Nebulization BID  ? chlorhexidine  15 mL Mouth Rinse BID  ? Chlorhexidine Gluconate Cloth  6 each Topical Daily  ? diltiazem  30 mg Oral O0B  ? folic acid  1 mg Oral Daily  ? levalbuterol  1.25 mg Nebulization Q6H WA  ? mouth rinse  15 mL Mouth Rinse q12n4p  ? multivitamin with minerals  1 tablet Oral Daily  ? pantoprazole (PROTONIX) IV  40 mg Intravenous Q12H  ? potassium chloride SA  40 mEq Oral Daily  ? revefenacin  175 mcg Nebulization Daily  ? thiamine  100 mg Oral Daily  ? Or  ? thiamine  100 mg Intravenous Daily  ? ?Infusions:  ? amiodarone 30 mg/hr (10/31/21 0825)  ? diltiazem (CARDIZEM) infusion 15 mg/hr (10/31/21 0850)  ? heparin 1,500 Units/hr (10/31/21 0700)  ? sodium chloride    ? ? ?Assessment: ?Pharmacy  consulted to dose heparin for atrial fibrillation.  No anticoagulants PTA. ?  ?Today, 10/31/2021: ?-Heparin level therapeutic at 0.50 on heparin 1500 units/hr ?-CBC: Hgb 10.7, slightly decreased from yesterday afternoon ?-No bleeding or infusion related issues noted ? ?Goal of Therapy:  ?Heparin level 0.3-0.7 units/ml ?Monitor platelets by anticoagulation protocol: Yes ?  ?Plan:  ?-Continue heparin IV infusion at 1500 units/hr ?-Daily heparin level and CBC ?-Plan for TEE guided cardioversion, possibly today  ?-F/u transition to PO anticoagulation as appropriate and if cleared by team ? ? ?Tawnya Crook, PharmD, BCPS ?Clinical Pharmacist ?10/31/2021 9:39 AM ? ? ? ? ?

## 2021-10-31 NOTE — Anesthesia Preprocedure Evaluation (Addendum)
Anesthesia Evaluation  ?Patient identified by MRN, date of birth, ID band ?Patient awake ? ? ? ?Reviewed: ?Allergy & Precautions, NPO status , Patient's Chart, lab work & pertinent test results ? ?Airway ?Mallampati: III ? ?TM Distance: >3 FB ?Neck ROM: Full ? ? ? Dental ? ?(+) Dental Advisory Given, Teeth Intact ?  ?Pulmonary ?shortness of breath and with exertion, asthma , COPD (oxygen at home 2LPM [place] ),  COPD inhaler and oxygen dependent,  ?Likely OSA, has not had sleep study yet ?Has been on oxygen at home for about 1 year now  ?  ?Pulmonary exam normal ?breath sounds clear to auscultation ? ? ? ? ? ? Cardiovascular ?hypertension, Pt. on medications ?pulmonary hypertension (mild pHTN)+CHF (grade 2 diastolic dysfunction)  ?Normal cardiovascular exam+ dysrhythmias (afib with RVR, rate in the 140s) Atrial Fibrillation  ?Rhythm:Regular Rate:Normal ? ?Echo 02/2021 ??1. Left ventricular ejection fraction, by estimation, is 60 to 65%. The  ?left ventricle has normal function. The left ventricle has no regional  ?wall motion abnormalities. There is moderate concentric left ventricular  ?hypertrophy. Left ventricular  ?diastolic parameters are consistent with Grade II diastolic dysfunction  ?(pseudonormalization). Elevated left atrial pressure.  ??2. Right ventricular systolic function is normal. The right ventricular  ?size is normal. There is mildly elevated pulmonary artery systolic  ?pressure.  ??3. Left atrial size was moderately dilated.  ??4. The mitral valve is normal in structure. Mild mitral valve  ?regurgitation. No evidence of mitral stenosis.  ??5. The aortic valve is tricuspid. Aortic valve regurgitation is mild. No  ?aortic stenosis is present.  ??6. The inferior vena cava is dilated in size with <50% respiratory  ?variability, suggesting right atrial pressure of 15 mmHg.  ?  ?Neuro/Psych ?PSYCHIATRIC DISORDERS Anxiety Depression negative neurological ROS ?   ?  GI/Hepatic ?PUD, (+)  ?  ? substance abuse ? alcohol use, 1 forty ounce per day, never had withdrawal  ?  ?Endo/Other  ?Morbid obesityBMI 43 ? Renal/GU ?negative Renal ROS  ?negative genitourinary ?  ?Musculoskeletal ? ?(+) Arthritis , Osteoarthritis,   ? Abdominal ?(+) + obese,   ?Peds ?negative pediatric ROS ?(+)  Hematology ?negative hematology ROS ?(+)   ?Anesthesia Other Findings ? ? Reproductive/Obstetrics ?negative OB ROS ? ?  ? ? ? ? ? ? ? ? ? ? ? ? ? ?  ?  ? ? ? ? ? ? ?Anesthesia Physical ?Anesthesia Plan ? ?ASA: 4 ? ?Anesthesia Plan: MAC  ? ?Post-op Pain Management:   ? ?Induction:  ? ?PONV Risk Score and Plan: 2 and Propofol infusion and TIVA ? ?Airway Management Planned: Natural Airway and Simple Face Mask ? ?Additional Equipment: None ? ?Intra-op Plan:  ? ?Post-operative Plan:  ? ?Informed Consent: I have reviewed the patients History and Physical, chart, labs and discussed the procedure including the risks, benefits and alternatives for the proposed anesthesia with the patient or authorized representative who has indicated his/her understanding and acceptance.  ? ? ? ?Dental advisory given ? ?Plan Discussed with: CRNA ? ?Anesthesia Plan Comments:   ? ? ? ? ?Anesthesia Quick Evaluation ? ?

## 2021-10-31 NOTE — Assessment & Plan Note (Deleted)
Corrects to normal for hyperglycemia. ?

## 2021-10-31 NOTE — Interval H&P Note (Signed)
History and Physical Interval Note: ? ?10/31/2021 ?2:12 PM ? ?[name] [name]  has presented today for surgery, with the diagnosis of Afib RVR.  The various methods of treatment have been discussed with the patient and family. After consideration of risks, benefits and other options for treatment, the patient has consented to  Procedure(s): ?TRANSESOPHAGEAL ECHOCARDIOGRAM (TEE) (N/A) ?CARDIOVERSION (N/A) as a surgical intervention.  The patient's history has been reviewed, patient examined, no change in status, stable for surgery.  I have reviewed the patient's chart and labs.  Questions were answered to the patient's satisfaction.   ? ?After careful review of history and examination, the risks, benefits of transesophageal echocardiogram, and alternatives have been explained to the patient and daughter. Complications include but not limited to esophageal perforation (rare), gastrointestinal bleeding (rare), cardiac arrhythmia which can include cardiac arrest and death (rare), pharyngeal irritation / discomfort with swallowing / hematoma, methemoglobinemia, bronchospasm, transient hypoxia, nonsustained ventricular tachycardia, transient atrial fibrillation, minimal hemoptysis, vomiting, hypotension, respiratory compromise, reaction to medications, unavoidable damage to teeth and gums, aspiration pneumonia  were reviewed with the patient and daughter.  Patient voices understanding, provides verbal feedback, questions answered, and patient and daughter wishes to proceed with the procedure. ? ?Risks, benefits, and alternatives of direct current cardioversion reviewed with the and patient and daughter. Risk includes but not limited to: potential for post-cardioversion rhythms, life-threatening arrhythmias (ventricular tachycardia and fibrillation, profound bradycardia, cardiac arrest needing cardiopulmonary resuscitation), myocardial damage, acute pulmonary edema, skin burns, transient hypotension. Benefits include  restoration of sinus rhythm. Alternatives to treatment were discussed, questions were answered, patient voices understanding and provides verbal feedback.  Patient and daughter  willing to proceed.  ? ?Labs reviewed: potassium levels have improved and lactic acid improving. Elevated WBC counts likely due to steroid use.  ? ?Daughter can be reached at [phone] ? ?Alesa Echevarria Bendena, DO, FACC ? ?Pager: [phone] ?Office: [phone] ? ? ?

## 2021-10-31 NOTE — Progress Notes (Signed)
Per Dr. Terri Skains to give patient metoprolol (due at 1745) with a HR at 61 and BP in 130's. Also ok for patient to have a cardiac diet as long as the patient is able to swallow well. This nurse assessed the patient's swallowing with water and the patient tolerated well.  ?

## 2021-10-31 NOTE — CV Procedure (Signed)
Transesophageal echocardiogram (TEE) : Preliminary report ?10/31/21 ? ?Sedation: See anesthesia records.  ? ?TEE was performed without complications ?  ?LV: 25-30% with global and regional hypokinesis.  ?RV: Normal structure with reduced function.  ?LA: Dilated.  Spontaneous echo contrast was no present.  No thrombus present. ?Left atrial appendage: Spontaneous echo contrast was not present.  No thrombus present. ?Inter atrial septum is intact without defect. Double contrast study negative for atrial level shunting. ?RA: Grossly normal. ?  ?MV: mild to moderate regurgitation, no stenosis, no vegetation noted.  ?TV: mild regurgitation, no stenosis, no vegetation noted. ?AV: no regurgitation, no stenosis, no vegetation noted.   ?PV: mild regurgitation, no stenosis, no vegetation noted. ?  ?Thoracic and ascending aorta: mild plaque but not well visualized (patient coughing).  ?  ?Final report forthcoming. ? ?Rex Kras, DO, FACC ? ?Pager: [phone] ?Office: [phone] ? ?Direct current cardioversion: ?Indications:  Atrial Fibrillation ? ?Procedure Details: ? ?Consent: Risks of procedure as well as the alternatives and risks of each were explained to the (patient/caregiver).  Consent for procedure obtained. ? ?Time Out: Verified patient identification, verified procedure, site/side was marked, verified correct patient position, special equipment/implants available, medications/allergies/relevent history reviewed, required imaging and test results available. PERFORMED. ? ?Patient placed on cardiac monitor, pulse oximetry, supplemental oxygen as necessary.  ?Sedation given:  see anesthesia records (electively intubated prior to TEE/DCCV) ?Pacer pads placed anterior and posterior chest. ? ?Cardioverted 1 time(s).  ?Cardioversion with synchronized biphasic 200J shock. ? ?Evaluation: ?Findings: Post procedure EKG shows:  ordered.  ?Complications: None but electively intubated prior to TEE/DCCV to protect her airways.   ?Patient did tolerate procedure well. ?Will update family.  ?D/C Cardizem due to low LVEF ?Add lopressor '25mg'$  po tid w/ holding parameter.  ?Echo tomorrow.  ? ?[name] [name] ?10/31/2021, 3:10 PM ? ?

## 2021-10-31 NOTE — Progress Notes (Signed)
[facility] Progress Note ? ?[name] [name] 61 y.o. 06-27-1961 ? ?CC:  Iron deficiency anemia ? ? ?Subjective: ?Patient resting in bed this morning. TEE and cardioversion planned for this afternoon. Denies abdominal pain, nausea, vomiting. Had normal BM last night without evidence of overt GI bleeding. Did have vasovagal episode during bowel movement. On 2L oxygen per nasal cannula. ? ?ROS : Review of Systems  ?Constitutional:  Negative for chills and fever.  ?Gastrointestinal:  Negative for abdominal pain, blood in stool, constipation, diarrhea, heartburn, melena, nausea and vomiting.   ? ? ?Objective: ?Vital signs in last 24 hours: ?Vitals:  ? 10/31/21 1132 10/31/21 1321  ?BP: 105/69 109/70  ?Pulse: (!) 145 (!) 141  ?Resp: (!) 27 15  ?Temp: 97.9 ?F (36.6 ?C) (!) 97.1 ?F (36.2 ?C)  ?SpO2: 96% 100%  ? ? ?Physical Exam: ? ?General:  Alert, cooperative, no distress, appears stated age  ?Head:  Normocephalic, without obvious abnormality, atraumatic  ?Eyes:  Anicteric sclera, EOM's intact  ?Lungs:   Clear to auscultation bilaterally, respirations unlabored  ?Heart:  Irregular, tachycardic, normal heart sounds  ?Abdomen:   Soft, non-tender, bowel sounds active all four quadrants,  no masses,   ?Extremities: Extremities normal, atraumatic, no  edema  ?Pulses: 2+ and symmetric  ? ? ?Lab Results: ?Recent Labs  ?  10/30/21 ?0246 10/31/21 ?5993 10/31/21 ?0906  ?NA 136 133* 133*  ?K 4.5 5.5* 5.0  ?CL 101 100 101  ?CO2 '27 24 23  '$ ?GLUCOSE 234* 361* 265*  ?BUN 19 30* 27*  ?CREATININE 0.69 0.98 0.87  ?CALCIUM 9.1 8.9 8.8*  ?MG 2.0 2.3  --   ?PHOS  --  5.0*  --   ? ?Recent Labs  ?  10/31/21 ?0251  ?ALBUMIN 3.1*  ? ?Recent Labs  ?  10/30/21 ?5701 10/31/21 ?0251  ?WBC 19.0* 23.5*  ?HGB 11.2* 10.7*  ?HCT 35.7* 34.1*  ?MCV 82.6 82.6  ?PLT 449* 364  ? ?No results for input(s): LABPROT, INR in the last 72 hours. ? ? ? ?Assessment ?Iron deficiency anemia ?- Hgb 10.5 on admission with drop to 9.0 and report of dark stools 2 weeks  ago. ?- Hgb 10.7 today 4/13 ?- MCV 82.6, iron 17, ferritin 11 ?- BUN 27, Cr 0.87, GFR >60 ?- History of gastric ulcer and H. Pylori gastritis as seen on EGD 2016 ?- On IV heparin for A. fib ?  ?Paroxysmal A. Fib ?Acute COPD exacerbation ?Chronic diastolic CHF ?Alcohol use disorder ? ? ?Plan: ?Hgb 10.7 today. Currently no abdominal pain, nausea, vomiting, heartburn. Bowel movement last night with no evidence of overt bleeding. No plan for endoscopic procedures at this time in light of stable hemoglobin and patient's comorbidities.  ?Continue Protonix. ?Continue supportive care.  ?[facility] will follow.  ? ?Angelique Holm PA-C ?10/31/2021, 1:27 PM ? ?Contact #  [phone]  ?

## 2021-10-31 NOTE — Progress Notes (Signed)
?  Echocardiogram ?Echocardiogram Transesophageal has been performed. ? ?Roseanna Rainbow R ?10/31/2021, 3:10 PM ?

## 2021-10-31 NOTE — Anesthesia Procedure Notes (Addendum)
Procedure Name: Intubation ?Date/Time: 10/31/2021 2:39 PM ?Performed by: Pervis Hocking, DO ?Pre-anesthesia Checklist: Patient identified, Emergency Drugs available, Suction available and Patient being monitored ?Patient Re-evaluated:Patient Re-evaluated prior to induction ?Oxygen Delivery Method: Circle System Utilized ?Preoxygenation: Pre-oxygenation with 100% oxygen ?Induction Type: IV induction and Rapid sequence ?Ventilation: Mask ventilation without difficulty, Oral airway inserted - appropriate to patient size and Two handed mask ventilation required ?Laryngoscope Size: McGraph and 4 ?Grade View: Grade I ?Tube type: Oral ?Tube size: 7.0 mm ?Number of attempts: 1 ?Airway Equipment and Method: Stylet and Oral airway ?Placement Confirmation: ETT inserted through vocal cords under direct vision, positive ETCO2 and breath sounds checked- equal and bilateral ?Secured at: 22 cm ?Tube secured with: Tape ?Dental Injury: Teeth and Oropharynx as per pre-operative assessment  ?Difficulty Due To: Difficulty was anticipated and Difficult Airway- due to limited oral opening ?Future Recommendations: Recommend- induction with short-acting agent, and alternative techniques readily available ?Comments: See intraop note- difficult to sedate d/t copious secretions, morbid obesity, OSA. Difficult mask ventilation requiring 2 providers and oral airway. Difficult intubation w/ glide due to small mouth opening, redundant tissue, copious secretions. Recommend RSI in the future and glidescope intubation.  ? ? ? ? ?

## 2021-10-31 NOTE — Progress Notes (Signed)
?PROGRESS NOTE ? ?[name] [name] LPF:[id number] DOB: Jun 23, 1961  ? ?PCP: Kerin Perna, NP ? ?Patient is from: Home ? ?DOA: 10/27/2021 LOS: 4 ? ?Chief complaints ?Chief Complaint  ?Patient presents with  ? Shortness of Breath  ?  ? ?Brief Narrative / Interim history: ?61 year old F with PMH of COPD, chronic hypoxic RF on 2 L, A-fib/PVCs, diastolic CHF, alcohol abuse, anxiety, chronic pain, anemia, gastric ulcer morbid obesity presenting with progressive shortness of breath and admitted for acute on chronic hypoxic respiratory failure due to COPD exacerbation and A-fib with RVR.  Initially required BiPAP for hypoxic respiratory failure and work of breathing.  Respiratory symptoms improved.  Remained in A-fib with RVR.  Pulmonology and cardiology following.  Cardiology planning TEE with cardioversion.  GI consulted for iron deficiency anemia.  ? ?Subjective: ?Seen and examined earlier this morning.  No major events overnight or this morning.  She remains in A-fib with RVR with HR as high as 140s.  She is not symptomatic from this.  She denies lightheadedness, shortness of breath or chest pain.  Abdominal pain resolved.  She denies nausea or vomiting.  She had a small bowel movement.  Denies melena or hematochezia. ? ?Objective: ?Vitals:  ? 10/31/21 0802 10/31/21 1132 10/31/21 1200 10/31/21 1321  ?BP:  105/69 121/82 109/70  ?Pulse:  (!) 145 (!) 35 (!) 141  ?Resp:  (!) '27 14 15  '$ ?Temp: 97.9 ?F (36.6 ?C) 97.9 ?F (36.6 ?C)  (!) 97.1 ?F (36.2 ?C)  ?TempSrc: Oral Oral  Tympanic  ?SpO2:  96% 94% 100%  ?Weight:    108.9 kg  ?Height:    '5\' 3"'$  (1.6 m)  ? ? ?Examination: ? ?GENERAL: No apparent distress.  Nontoxic. ?HEENT: MMM.  Vision and hearing grossly intact.  ?NECK: Supple.  No apparent JVD.  ?RESP:  No IWOB.  Fair aeration bilaterally. ?CVS: IR IR. Heart sounds normal.  ?ABD/GI/GU: BS+. Abd soft, NTND.  ?MSK/EXT:  Moves extremities. No apparent deformity. No edema.  ?SKIN: no apparent skin lesion or wound ?NEURO: Awake  and alert. Oriented appropriately.  No apparent focal neuro deficit. ?PSYCH: Calm. Normal affect.  ? ?Procedures:  ?None ? ?Microbiology summarized: ?COVID-19 and influenza PCR nonreactive. ?MRSA PCR screen negative. ? ?Assessment and Plan: ?* COPD with acute exacerbation ([place]) ?Seems to have resolved.  See respiratory failure for detail ? ? ?Paroxysmal atrial fibrillation (HCC) ?Remains in RVR with HR as high as 140s and 150s but asymptomatic.  TTE in 02/2021 with LVEF of 60 to 65%, G2 DD.  Appears euvolemic.  TSH low but free T4 within normal. ?-Cardiology following ?-On IV amiodarone, Cardizem, digoxin and heparin ?-Optimize electrolytes ?-Plan for TEE cardioversion today. ? ? ?Acute on chronic respiratory failure with hypoxia (HCC) ?In the setting of COPD exacerbation and A-fib with RVR.  She is also at risk for OSA given morbid obesity and alcohol abuse.  Initially required BiPAP but improved.  Currently on 2 L by [facility] but saturating in upper 90s to 100.  Some documented desaturation to upper 80s overnight.  Improved. ?-PCCM following ?-Discontinue systemic steroid ?-Minimum oxygen to keep saturation above 90% ?-Encourage incentive spirometry ? ? ?Lactic acidosis ?Unclear source of this at this point.  She has leukocytosis likely from steroid versus infection.  She had an episode of abdominal pain and tenderness yesterday that has resolved.  She is at risk for mesenteric ischemia from A-fib.  Blood pressure stable.  Improved with LR bolus ?-Continue trending. ? ?Bandemia ?No clear  source of infection.  Suspect demargination from steroid. ?-Discontinue steroid now COPD exacerbation has resolved ?-Recheck in the morning ? ?Iron deficiency anemia ?Recent Labs  ?  03/25/21 ?0456 05/14/21 ?0654 05/15/21 ?0438 05/16/21 ?0657 08/22/21 ?1143 10/27/21 ?1925 10/28/21 ?0020 10/30/21 ?0246 10/30/21 ?6433 10/31/21 ?0251  ?HGB 10.8* 11.8* 11.1* 10.6* 13.1 10.5* 10.3* 9.0* 11.2* 10.7*  ?Patient with history of gastric ulcer and  ongoing NSAID use.  She denies hematochezia but noted dark stool about 2 weeks ago.  Anemia panel consistent with severe iron deficiency.  Hemoccult negative.  H&H relatively stable. ?-Continue PPI while in-house ?-Received IV ferric gluconate 250 mg x 1 on 4/12.  She had some near syncopal episode an hour after IV iron although it was thought to be vasovagal since it happened while she was on commode ?-Appreciate help by GI. ?-Advised against NSAIDs. ? ? ? ?Euthyroid sick syndrome ?Low TSH but normal free T4.  Unreliable in acute setting but does not suggest hyperthyroidism. ? ?Hyperglycemia ?Likely from steroid.  A1c 5.6%.  Discontinued steroid. ? ?Obesity, Class III, BMI 40-49.9 (morbid obesity) ([place]) ?Body mass index is 41.71 kg/m?. ?Encourage lifestyle change to lose weight. ? ?Chronic diastolic CHF (congestive heart failure) ([place]) ?TTE in 02/2021 with LVEF of 60 to 65% and G2 DD.  Grossly euvolemic but difficult exam due to body habitus.  Seems to be on p.o. Lasix at home. ?-Defer diuretics to cardiology ?-Monitor fluid and respiratory status ?-Monitor renal functions and electrolytes ? ?Hypertension ?On multiple antihypertensive meds but not compliant.  Currently normotensive. ?-Continue p.o. and IV Cardizem per cardiology ?-Hold home amlodipine ? ? ? ?Hyponatremia, hypokalemia, hyperkalemia and hyperphosphatemia ?Hypokalemia resolved.  K5.5.  P5.0.  Hyponatremia corrects to normal for hyperglycemia. ?-Received Lokelma 5 g  ?-Recheck electrolytes in the morning ? ? ?Anxiety with depression ?Stable.  Continue home Atarax. ? ?Alcohol use disorder ?Reports drinking about 2 of 40 ounce beers a day.  Denies withdrawal symptoms ?-Continue CIWA monitoring with as needed Ativan ?-Continue multivitamin, thiamine and folic acid ? ? ? ? ?DVT prophylaxis:  ?SCDs Start: 10/27/21 2123 ? On IV heparin for atrial fibrillation. ? ?Code Status:  ?Family Communication: Updated patient's daughter at bedside. ?Level of care:  Stepdown ?Status is: Inpatient ?Remains inpatient appropriate because: A-fib with RVR ? ? ?Final disposition: Likely home once medically clear. ? ?Consultants:  ?PCCM ?Cardiology ?Gastroenterology ? ?Sch Meds:  ?Scheduled Meds: ? [MAR Hold] arformoterol  15 mcg Nebulization BID  ? [MAR Hold] azithromycin  500 mg Oral QHS  ? [MAR Hold] budesonide (PULMICORT) nebulizer solution  0.5 mg Nebulization BID  ? [MAR Hold] chlorhexidine  15 mL Mouth Rinse BID  ? [MAR Hold] Chlorhexidine Gluconate Cloth  6 each Topical Daily  ? [MAR Hold] diltiazem  30 mg Oral Q6H  ? [MAR Hold] folic acid  1 mg Oral Daily  ? [MAR Hold] levalbuterol  1.25 mg Nebulization Q6H WA  ? [MAR Hold] mouth rinse  15 mL Mouth Rinse q12n4p  ? [MAR Hold] multivitamin with minerals  1 tablet Oral Daily  ? [MAR Hold] pantoprazole (PROTONIX) IV  40 mg Intravenous Q12H  ? [MAR Hold] potassium chloride SA  40 mEq Oral Daily  ? [MAR Hold] revefenacin  175 mcg Nebulization Daily  ? [MAR Hold] thiamine  100 mg Oral Daily  ? Or  ? [MAR Hold] thiamine  100 mg Intravenous Daily  ? ?Continuous Infusions: ? sodium chloride    ? sodium chloride    ? amiodarone 30  mg/hr (10/31/21 0825)  ? diltiazem (CARDIZEM) infusion 15 mg/hr (10/31/21 0850)  ? heparin 1,500 Units/hr (10/31/21 1231)  ? lactated ringers 10 mL/hr at 10/31/21 1319  ? ?PRN Meds:.[MAR Hold] acetaminophen **OR** [MAR Hold] acetaminophen, [MAR Hold]  HYDROmorphone (DILAUDID) injection, [MAR Hold] hydrOXYzine, [MAR Hold] levalbuterol, [MAR Hold] LORazepam **OR** [MAR Hold] LORazepam, [MAR Hold] metoprolol tartrate, [MAR Hold] nitroGLYCERIN ? ?Antimicrobials: ?Anti-infectives (From admission, onward)  ? ? Start     Dose/Rate Route Frequency Ordered Stop  ? 10/28/21 2200  [MAR Hold]  azithromycin (ZITHROMAX) tablet 500 mg        (MAR Hold since Thu 10/31/2021 at 1314.Hold Reason: Transfer to a Procedural area)  ? 500 mg Oral Daily at bedtime 10/28/21 1416    ? 10/27/21 2130  azithromycin (ZITHROMAX) 500 mg in  sodium chloride 0.9 % 250 mL IVPB  Status:  Discontinued       ?Note to Pharmacy: (In setting of acute copd exac)  ? 500 mg ?250 mL/hr over 60 Minutes Intravenous Every 24 hours 10/27/21 2126 10/28/21 1416  ? ?

## 2021-10-31 NOTE — Assessment & Plan Note (Addendum)
Likely from poor formation in the setting of A-fib with RVR.  Blood pressure stable.  Resolved. ?

## 2021-10-31 NOTE — Assessment & Plan Note (Addendum)
Likely demargination from steroid.  Improved after discontinuing steroid. ?

## 2021-10-31 NOTE — Anesthesia Postprocedure Evaluation (Signed)
Anesthesia Post Note ? ?Patient: MIRELLA GUEYE ? ?Procedure(s) Performed: TRANSESOPHAGEAL ECHOCARDIOGRAM (TEE) ?CARDIOVERSION ?BUBBLE STUDY ? ?  ? ?Patient location during evaluation: PACU ?Anesthesia Type: General ?Level of consciousness: awake and alert, oriented and patient cooperative ?Pain management: pain level controlled ?Vital Signs Assessment: post-procedure vital signs reviewed and stable ?Respiratory status: spontaneous breathing, nonlabored ventilation and respiratory function stable ?Cardiovascular status: blood pressure returned to baseline and stable ?Postop Assessment: no apparent nausea or vomiting ?Anesthetic complications: no ?Comments: Intubated emergently after failed sedation for TEE/CV- see intraop notes ? ? ?No notable events documented. ? ?Last Vitals:  ?Vitals:  ? 10/31/21 1200 10/31/21 1321  ?BP: 121/82 109/70  ?Pulse: (!) 35 (!) 141  ?Resp: 14 15  ?Temp:  (!) 36.2 ?C  ?SpO2: 94% 100%  ?  ?Last Pain:  ?Vitals:  ? 10/31/21 1321  ?TempSrc: Tympanic  ?PainSc: 0-No pain  ? ? ?  ?  ?  ?  ?  ?  ? ?Jarome Matin Kem Hensen ? ? ? ? ?

## 2021-11-01 ENCOUNTER — Inpatient Hospital Stay (HOSPITAL_COMMUNITY): Payer: Medicare Other

## 2021-11-01 ENCOUNTER — Encounter (HOSPITAL_COMMUNITY): Payer: Self-pay | Admitting: Cardiology

## 2021-11-01 DIAGNOSIS — J9621 Acute and chronic respiratory failure with hypoxia: Secondary | ICD-10-CM | POA: Diagnosis not present

## 2021-11-01 DIAGNOSIS — D500 Iron deficiency anemia secondary to blood loss (chronic): Secondary | ICD-10-CM | POA: Diagnosis not present

## 2021-11-01 DIAGNOSIS — I5043 Acute on chronic combined systolic (congestive) and diastolic (congestive) heart failure: Secondary | ICD-10-CM

## 2021-11-01 DIAGNOSIS — I480 Paroxysmal atrial fibrillation: Secondary | ICD-10-CM | POA: Diagnosis not present

## 2021-11-01 DIAGNOSIS — J441 Chronic obstructive pulmonary disease with (acute) exacerbation: Secondary | ICD-10-CM | POA: Diagnosis not present

## 2021-11-01 LAB — CBC
HCT: 30.9 % — ABNORMAL LOW (ref 36.0–46.0)
Hemoglobin: 9.9 g/dL — ABNORMAL LOW (ref 12.0–15.0)
MCH: 26.3 pg (ref 26.0–34.0)
MCHC: 32 g/dL (ref 30.0–36.0)
MCV: 82.2 fL (ref 80.0–100.0)
Platelets: 282 10*3/uL (ref 150–400)
RBC: 3.76 MIL/uL — ABNORMAL LOW (ref 3.87–5.11)
RDW: 18.4 % — ABNORMAL HIGH (ref 11.5–15.5)
WBC: 18.4 10*3/uL — ABNORMAL HIGH (ref 4.0–10.5)
nRBC: 0.3 % — ABNORMAL HIGH (ref 0.0–0.2)

## 2021-11-01 LAB — COMPREHENSIVE METABOLIC PANEL
ALT: 23 U/L (ref 0–44)
AST: 13 U/L — ABNORMAL LOW (ref 15–41)
Albumin: 3.2 g/dL — ABNORMAL LOW (ref 3.5–5.0)
Alkaline Phosphatase: 42 U/L (ref 38–126)
Anion gap: 9 (ref 5–15)
BUN: 22 mg/dL — ABNORMAL HIGH (ref 6–20)
CO2: 26 mmol/L (ref 22–32)
Calcium: 8.8 mg/dL — ABNORMAL LOW (ref 8.9–10.3)
Chloride: 103 mmol/L (ref 98–111)
Creatinine, Ser: 0.74 mg/dL (ref 0.44–1.00)
GFR, Estimated: >60 mL/min (ref >60)
Glucose, Bld: 176 mg/dL — ABNORMAL HIGH (ref 70–99)
Potassium: 4.9 mmol/L (ref 3.5–5.1)
Sodium: 138 mmol/L (ref 135–145)
Total Bilirubin: 0.2 mg/dL — ABNORMAL LOW (ref 0.3–1.2)
Total Protein: 6 g/dL — ABNORMAL LOW (ref 6.5–8.1)

## 2021-11-01 LAB — MAGNESIUM: Magnesium: 2.3 mg/dL (ref 1.7–2.4)

## 2021-11-01 LAB — PHOSPHORUS: Phosphorus: 5.5 mg/dL — ABNORMAL HIGH (ref 2.5–4.6)

## 2021-11-01 LAB — LACTIC ACID, PLASMA: Lactic Acid, Venous: 1.9 mmol/L (ref 0.5–1.9)

## 2021-11-01 LAB — BRAIN NATRIURETIC PEPTIDE: B Natriuretic Peptide: 105.9 pg/mL — ABNORMAL HIGH (ref 0.0–100.0)

## 2021-11-01 LAB — HEPARIN LEVEL (UNFRACTIONATED): Heparin Unfractionated: 0.66 IU/mL (ref 0.30–0.70)

## 2021-11-01 MED ORDER — PERFLUTREN LIPID MICROSPHERE
1.0000 mL | INTRAVENOUS | Status: AC | PRN
  Administered 2021-11-01: 2 mL via INTRAVENOUS
  Filled 2021-11-01: qty 10

## 2021-11-01 MED ORDER — FUROSEMIDE 10 MG/ML IJ SOLN
20.0000 mg | Freq: Every day | INTRAMUSCULAR | Status: DC
  Administered 2021-11-01 – 2021-11-05 (×4): 20 mg via INTRAVENOUS
  Filled 2021-11-01 (×4): qty 2

## 2021-11-01 MED ORDER — ATORVASTATIN CALCIUM 20 MG PO TABS
20.0000 mg | ORAL_TABLET | Freq: Every day | ORAL | Status: DC
  Administered 2021-11-01 – 2021-11-04 (×4): 20 mg via ORAL
  Filled 2021-11-01 (×4): qty 1

## 2021-11-01 MED ORDER — LEVALBUTEROL HCL 0.63 MG/3ML IN NEBU
0.6300 mg | INHALATION_SOLUTION | Freq: Two times a day (BID) | RESPIRATORY_TRACT | Status: DC
  Administered 2021-11-02 – 2021-11-03 (×4): 0.63 mg via RESPIRATORY_TRACT
  Filled 2021-11-01 (×5): qty 3

## 2021-11-01 MED ORDER — APIXABAN 5 MG PO TABS
5.0000 mg | ORAL_TABLET | Freq: Two times a day (BID) | ORAL | Status: DC
  Administered 2021-11-01 – 2021-11-02 (×2): 5 mg via ORAL
  Filled 2021-11-01 (×2): qty 1

## 2021-11-01 MED ORDER — SACUBITRIL-VALSARTAN 24-26 MG PO TABS
1.0000 | ORAL_TABLET | Freq: Two times a day (BID) | ORAL | Status: DC
  Administered 2021-11-01 – 2021-11-05 (×8): 1 via ORAL
  Filled 2021-11-01 (×9): qty 1

## 2021-11-01 MED ORDER — LIP MEDEX EX OINT
1.0000 "application " | TOPICAL_OINTMENT | CUTANEOUS | Status: DC | PRN
  Filled 2021-11-01: qty 7

## 2021-11-01 NOTE — Progress Notes (Incomplete)
Echocardiogram not complete, [name] is eating breakfast. Will re-attempt at another time today. ?Echocardiogram attempted, [name] undergoing personal care.  ? ?[name] [name] ?

## 2021-11-01 NOTE — Progress Notes (Signed)
Progress Note ? ?Patient Name: [name] [name] ?Date of Encounter: 11/01/2021 ? ?Attending physician: Mercy Riding, MD ?Primary care provider: Kerin Perna, NP ? ?Subjective: ?DENNISE RAABE is a 61 y.o. female who was seen and examined at bedside  ?Denies chest pain, shortness of breath, lower extremity swelling, orthopnea, paroxysmal nocturnal dyspnea ?Eating breakfast. ?Remains in sinus rhythm. ?Does not endorse evidence of bleeding or difficulty swallowing. ?Case discussed and reviewed with her nurse. ? ?Objective: ?Vital Signs in the last 24 hours: ?Temp:  [97.1 ?F (36.2 ?C)-97.9 ?F (36.6 ?C)] 97.3 ?F (36.3 ?C) (04/14 0740) ?Pulse Rate:  [25-146] 68 (04/14 0700) ?Resp:  [13-41] 28 (04/14 0700) ?BP: (80-139)/(53-89) 113/70 (04/14 0700) ?SpO2:  [93 %-100 %] 98 % (04/14 0755) ?Weight:  [108.9 kg-110.8 kg] 110.8 kg (04/14 0500) ? ?Intake/Output: ? ?Intake/Output Summary (Last 24 hours) at 11/01/2021 0905 ?Last data filed at 11/01/2021 0700 ?Gross per 24 hour  ?Intake 1367.33 ml  ?Output 200 ml  ?Net 1167.33 ml  ?  ?Net IO Since Admission: 5,141.09 mL [11/01/21 0905] ? ?Weights:  ?Filed Weights  ? 10/31/21 0500 10/31/21 1321 11/01/21 0500  ?Weight: 110.1 kg 108.9 kg 110.8 kg  ? ? ?Telemetry: Personally reviewed.  Atrial fibrillation with improved ventricular rate. ? ?Physical examination: ?PHYSICAL EXAM: ? ?  11/01/2021  ?  7:00 AM 11/01/2021  ?  6:00 AM 11/01/2021  ?  5:00 AM  ?Vitals with BMI  ?Weight   244 lbs 4 oz  ?BMI   43.28  ?Systolic 294 765 465  ?Diastolic 70 68 58  ?Pulse 68 69 67  ? ? ?General: Age-appropriate female, hemodynamically stable, no acute distress. ?Skin: Warm to touch, no rashes or jaundice. ?HEENT: Normocephalic, atraumatic, no scleral icterus, moist mucous membranes, no JVP or carotid bruit. ?Chest: Normal respiratory and expiratory efforts, no intercostal retractions noted. ?Lungs: Expiratory wheezes noted.  Surgical scar over the left lateral chest wall is well-healed. ?Heart: Regular,  positive K3-T4, soft holosystolic murmur heard at the apex, no gallops or rubs.   ?Abdomen: Soft, nontender, nondistended, positive bowel sounds in all 4 quadrants, ?Extremities: Nonpitting, warm to touch, 2+ DP and PT pulses. ? ? ?Lab Results: ?Hematology ?Recent Labs  ?Lab 10/30/21 ?6568 10/31/21 ?0251 11/01/21 ?1275  ?WBC 19.0* 23.5* 18.4*  ?RBC 4.32 4.13 3.76*  ?HGB 11.2* 10.7* 9.9*  ?HCT 35.7* 34.1* 30.9*  ?MCV 82.6 82.6 82.2  ?MCH 25.9* 25.9* 26.3  ?MCHC 31.4 31.4 32.0  ?RDW 18.3* 18.5* 18.4*  ?PLT 449* 364 282  ? ? ?Chemistry ?Recent Labs  ?Lab 10/28/21 ?0020 10/29/21 ?1700 10/31/21 ?1749 10/31/21 ?4496 11/01/21 ?0254  ?NA 140   < > 133* 133* 138  ?K 3.4*   < > 5.5* 5.0 4.9  ?CL 99   < > 100 101 103  ?CO2 28   < > '24 23 26  '$ ?GLUCOSE 176*   < > 361* 265* 176*  ?BUN 9   < > 30* 27* 22*  ?CREATININE 0.59   < > 0.98 0.87 0.74  ?CALCIUM 9.5   < > 8.9 8.8* 8.8*  ?PROT 7.7  --   --   --  6.0*  ?ALBUMIN 3.9  --  3.1*  --  3.2*  ?AST 21  --   --   --  13*  ?ALT 19  --   --   --  23  ?ALKPHOS 76  --   --   --  42  ?BILITOT 0.8  --   --   --  0.2*  ?GFRNONAA >60   < > >60 >60 >60  ?ANIONGAP 13   < > '9 9 9  '$ ? < > = values in this interval not displayed.  ?  ? ?Cardiac Enzymes: ?Cardiac Panel (last 3 results) ?Recent Labs  ?  10/29/21 ?1031  ?TROPONINIHS 8  ? ? ?BNP (last 3 results) ?Recent Labs  ?  05/14/21 ?0654 10/27/21 ?1925 11/01/21 ?0254  ?BNP 150.6* 173.6* 105.9*  ? ? ?ProBNP (last 3 results) ?No results for input(s): PROBNP in the last 8760 hours. ? ? ?DDimer No results for input(s): DDIMER in the last 168 hours.  ? ?Hemoglobin A1c:  ?Lab Results  ?Component Value Date  ? HGBA1C 5.6 10/30/2021  ? MPG 114.02 10/30/2021  ? ? ?TSH  ?Recent Labs  ?  03/12/21 ?0901 10/29/21 ?1047  ?TSH CANCELED 0.339*  ? ? ?Lipid Panel  ?   ?Component Value Date/Time  ? CHOL 181 08/22/2021 1143  ? TRIG 118 08/22/2021 1143  ? HDL 58 08/22/2021 1143  ? CHOLHDL 3.1 08/22/2021 1143  ? CHOLHDL 2.4 03/15/2010 0447  ? VLDL 12 03/15/2010 0447   ? LDLCALC 102 (H) 08/22/2021 1143  ? ? ?Imaging: ?No results found. ? ?CARDIAC DATABASE: ?EKG: ?10/27/2021: Sinus tachycardia, 116 bpm, frequent PVCs. ?  ?10/29/2021: A-fib with RVR, 162 bpm. ? ?11/01/2021: Normal sinus rhythm, 64 bpm, nonspecific ST-T changes. ?  ?Echocardiogram: ?03/19/2021: LVEF 60-65%, moderate LVH, grade 2 diastolic dysfunction, elevated LAP, moderately dilated left atrium, mild MR, mild AR, estimated RAP 15 mmHg. ?  ?Stress test: ?None ?  ?Heart catheterization: ?None ?  ? ?Scheduled Meds: ? arformoterol  15 mcg Nebulization BID  ? budesonide (PULMICORT) nebulizer solution  0.5 mg Nebulization BID  ? chlorhexidine  15 mL Mouth Rinse BID  ? Chlorhexidine Gluconate Cloth  6 each Topical Daily  ? folic acid  1 mg Oral Daily  ? levalbuterol  0.63 mg Nebulization TID  ? mouth rinse  15 mL Mouth Rinse q12n4p  ? metoprolol tartrate  25 mg Oral TID  ? multivitamin with minerals  1 tablet Oral Daily  ? pantoprazole (PROTONIX) IV  40 mg Intravenous Q12H  ? revefenacin  175 mcg Nebulization Daily  ? thiamine  100 mg Oral Daily  ? Or  ? thiamine  100 mg Intravenous Daily  ? ? ?Continuous Infusions: ? amiodarone Stopped (10/31/21 1800)  ? heparin 1,500 Units/hr (11/01/21 0700)  ? ? ?PRN Meds: ?acetaminophen **OR** acetaminophen, HYDROmorphone (DILAUDID) injection, hydrOXYzine, levalbuterol, lip balm, LORazepam **OR** LORazepam, metoprolol tartrate, nitroGLYCERIN, phenol  ? ?IMPRESSION & RECOMMENDATIONS: ?TWILIA YAKLIN is a 61 y.o. African-American female whose past medical history and cardiac risk factors include: Hypertension, asthma, HFpEF, paroxysmal atrial fibrillation, chronic anemia, asthma, alcohol abuse, postmenopausal female. ? ?Paroxysmal atrial fibrillation ?Status post TEE guided cardioversion 10/31/2021 200 J x 1. ?Rate control: Metoprolol. ?Rhythm control: N/A. ?Thromboembolic prophylaxis: IV heparin. ?Recommend transitioning IV heparin to oral anticoagulant. ?We will hold off on oral amiodarone  for now given her pulmonary condition.  She remains in NSR.  ? ?Long-term oral anticoagulation: ?Indication: Paroxysmal atrial fibrillation status post TEE guided cardioversion on 10/31/2021. ?CHA2DS2-VASc SCORE is greater than 2 therefore recommend anticoagulation. ?Patient is educated on importance of reducing her alcohol intake which will predispose her to bleeding complications in the future. ?Continue addressing her underlying iron deficiency defer to primary team. ?Risks, benefits, and alternatives to oral anticoagulation discussed with the patient and daughter over the phone during prior discussions. ? ?Cardiomyopathy: ?Recently  TEE performed on 10/31/2021 patient's LVEF was moderate/severely reduced differential diagnosis includes tachycardic induced cardiomyopathy, alcoholic cardiomyopathy, ischemia cannot be ruled out. ?Noted to have a low TSH but normal free T4.  Primary team does not feel that this is hyperthyroidism more likely euthyroid sick syndrome. ?Since she remains in normal sinus rhythm would like to repeat an echocardiogram today to reevaluate LVEF, regional wall motion abnormalities, and valvular heart disease. ?If the patient is noted to have reduced LVEF or regionality would recommend ischemic work-up prior to discharge. ? ?Congestive heart failure: ?Known history of HFpEF -would like to repeat an echo to reevaluate systolic dysfunction. ?Start Entresto 24/26 mg p.o. twice daily. ?Start Lasix 20 mg IVP daily, positive fluid balance. ?If renal function is stable will add [place] tomorrow.  ?Monitor BUN and creatinine. ?Transition Lopressor to Toprol-XL prior to discharge. ?Start Lipitor 20 mg p.o. nightly.  Most recent LDL 102 mg/dL. ?Hemoglobin A1c within acceptable range. ?Recommend discontinuation of Norvasc, home medications. ?Address underlying iron deficiency anemia ? ?Iron deficiency anemia: ?Hemoccult negative. ?Iron studies reviewed. ?History of gastric ulcer and ongoing NSAID  use. ?Currently being addressed by primary team. ? ?Patient's questions and concerns were addressed to her satisfaction. She voices understanding of the instructions provided during this encounter.  ? ?This note

## 2021-11-01 NOTE — Progress Notes (Signed)
SATURATION QUALIFICATIONS: (This note is used to comply with regulatory documentation for home oxygen) ? ?Patient Saturations on Room Air at Rest = 96% ? ?Patient Saturations on Room Air while Ambulating = 93% ? ?Patient Saturations on 0 Liters of oxygen while Ambulating ? ?[name] [name] ?11/01/2021 11:42 AM ? ?

## 2021-11-01 NOTE — Discharge Instructions (Signed)

## 2021-11-01 NOTE — Progress Notes (Signed)
ANTICOAGULATION CONSULT NOTE  ? ?Pharmacy Consult for heparin ?Indication: new onset atrial fibrillation with RVR ? ?Allergies  ?Allergen Reactions  ? Tetracyclines & Related Itching  ? ? ?Patient Measurements: ?Height: '5\' 3"'$  (160 cm) ?Weight: 110.8 kg (244 lb 4.3 oz) ?IBW/kg (Calculated) : 52.4 ?Heparin Dosing Weight: 78 kg ? ?Vital Signs: ?Temp: 97.7 ?F (36.5 ?C) (04/14 0346) ?Temp Source: Axillary (04/14 0346) ?BP: 125/68 (04/14 0600) ?Pulse Rate: 69 (04/14 0600) ? ?Labs: ?Recent Labs  ?  10/29/21 ?1031 10/29/21 ?1631 10/30/21 ?1615 10/30/21 ?0865 10/31/21 ?7846 10/31/21 ?9629 11/01/21 ?5284 11/01/21 ?1324  ?HGB  --    < >  --  11.2* 10.7*  --   --  9.9*  ?HCT  --    < >  --  35.7* 34.1*  --   --  30.9*  ?PLT  --    < >  --  449* 364  --   --  282  ?HEPARINUNFRC  --    < > 0.49  --  0.50  --   --  0.66  ?CREATININE  --    < >  --   --  0.98 0.87 0.74  --   ?TROPONINIHS 8  --   --   --   --   --   --   --   ? < > = values in this interval not displayed.  ? ? ?Estimated Creatinine Clearance: 89.5 mL/min (by C-G formula based on SCr of 0.74 mg/dL). ? ? ?Assessment: ?Patient is a 61 y.o F with hx COPD, PAF and HF presented to the ED on 10/27/21 with c/o SOB.  She went into afib with RVR on 10/29/21 and underwent TEE/DCCV on 10/31/21.  She's currently in heparin drip for afib. ? ?Today, 11/01/2021: ?- heparin level is therapeutic at 0.66 ?- hgb but somewhat stable, plts 282K ?- no bleeding documented ?- in NSR ? ?Goal of Therapy:  ?Heparin level 0.3-0.7 units/ml ?Monitor platelets by anticoagulation protocol: Yes ?  ?Plan:  ?- continue heparin drip at 1500 units/hr ?- daily heparin level ?- monitor for s/sx bleeding ? ?Dia Sitter P ?11/01/2021,7:34 AM ? ? ?

## 2021-11-01 NOTE — Progress Notes (Signed)
?PROGRESS NOTE ? ?[name] [name] ZOX:[id number] DOB: [birth year]  ? ?PCP: Kerin Perna, NP ? ?Patient is from: Home ? ?DOA: 10/27/2021 LOS: 5 ? ?Chief complaints ?Chief Complaint  ?Patient presents with  ? Shortness of Breath  ?  ? ?Brief Narrative / Interim history: ?61 year old F with PMH of COPD, chronic hypoxic RF on 2 L, A-fib/PVCs, diastolic CHF, alcohol abuse, anxiety, chronic pain, anemia, gastric ulcer morbid obesity presenting with progressive shortness of breath and admitted for acute on chronic hypoxic respiratory failure due to COPD exacerbation and A-fib with RVR.  Initially required BiPAP for hypoxic respiratory failure and work of breathing.  Cardiology and pulmonology consulted.   ? ?Respiratory symptoms improved.  Pulmonology signed off.   ? ?Patient had successful TEE cardioversion for A-fib with RVR on 10/31/2021.  TTE with LVEF of 25 to 30% with global and regional hypokinesis.  TTE ordered.  Cardiology following. ? ?GI consulted for iron deficiency anemia.  However, Hemoccult was negative.  H&H remained stable.  [facility] following.  Planning endoscopy and colonoscopy.  ? ?Subjective: ?Seen and examined earlier this morning.  No major events overnight or this morning.  No complaints.  She is a sleepy but wakes to voice.  Fully oriented.  No complaints.  She denies chest pain, dyspnea, palpitation, dizziness, GI or UTI symptoms. ? ?Objective: ?Vitals:  ? 11/01/21 1200 11/01/21 1300 11/01/21 1400 11/01/21 1418  ?BP: 128/70 131/72 132/73 121/81  ?Pulse: 67 66 71 73  ?Resp: '18 18 17 16  '$ ?Temp:    97.9 ?F (36.6 ?C)  ?TempSrc:    Oral  ?SpO2: 92% 95% 98% 97%  ?Weight:    110.9 kg  ?Height:      ? ? ?Examination: ? ?GENERAL: No apparent distress.  Nontoxic. ?HEENT: MMM.  Vision and hearing grossly intact.  ?NECK: Supple.  No apparent JVD.  ?RESP:  No IWOB.  Fair aeration bilaterally. ?CVS:  RRR. Heart sounds normal.  ?ABD/GI/GU: BS+. Abd soft, NTND.  ?MSK/EXT:  Moves extremities. No apparent deformity.  No edema.  ?SKIN: no apparent skin lesion or wound ?NEURO: Sleepy but wakes to voice.  Oriented x4.  No apparent focal neuro deficit. ?PSYCH: Calm. Normal affect.  ? ?Procedures:  ?4/13-successful TEE cardioversion ? ?Microbiology summarized: ?COVID-19 and influenza PCR nonreactive. ?MRSA PCR screen negative. ? ?Assessment and Plan: ?* COPD with acute exacerbation ([place]) ?Seems to have resolved.  See respiratory failure for detail ? ? ?Iron deficiency anemia ?Recent Labs  ?  05/14/21 ?0654 05/15/21 ?0438 05/16/21 ?0657 08/22/21 ?1143 10/27/21 ?1925 10/28/21 ?0020 10/30/21 ?0246 10/30/21 ?8119 10/31/21 ?0251 11/01/21 ?1478  ?HGB 11.8* 11.1* 10.6* 13.1 10.5* 10.3* 9.0* 11.2* 10.7* 9.9*  ?H&H relatively stable.  Hemoccult negative.  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  Constantly uses ibuprofen.  Anemia panel with iron deficiency.  ?-Continue PPI-changed to daily ?-Received IV ferric gluconate 250 mg x 1 on 4/12.  She had what looks like near syncope an hour after IV iron although it was thought to be vasovagal since it happened while she was on commode ?-Advised against NSAIDs. ?-Monitor intermittently. ?-[facility] following-seems to be planning EGD and colonoscopy. ? ? ? ?Acute on chronic combined CHF ?TEE with LVEF of 25 to 30% with global and regional hypokinesis.  TTE in 02/2021 with LVEF of 60 to 65%, G2-DD.  Grossly euvolemic but difficult exam due to body habitus.  BNP in the low 100s.  Seems to be on p.o. Lasix at home. ?-Cardiology managing-started IV Lasix,  Entresto, Lipitor and metoprolol. ?-Monitor fluid and respiratory status ?-Monitor renal functions and electrolytes ?-Follow TTE ? ?Paroxysmal atrial fibrillation (HCC) ?S/p successful TEE cardioversion on 10/31/2021.  TEE with LVEF of 25 to 30% with global and regional hypokinesis.  TTE in 02/2021 with LVEF of 60 to 65%, G2-DD.  TSH low but free T4 within normal. ?-On low-dose metoprolol for rate control. ?-On heparin for anticoagulation. ?-Optimize electrolytes ?-Follow  TTE ? ? ?Acute on chronic respiratory failure with hypoxia (HCC) ?In the setting of COPD exacerbation, A-fib with RVR and undiagnosed OSA.  She reports using 2 L at night.  She says she has not had a sleep study.  Respiratory failure seems to have resolved. ?-Wean off oxygen while awake.  Can use home 2 L at night ?-Minimum oxygen to keep saturation above 90% ?-A-fib per cardiology. ? ? ?Lactic acidosis ?Likely from poor formation in the setting of A-fib with RVR.  Blood pressure stable.  Resolved. ? ?Bandemia ?No clear source of infection.  Suspect demargination from steroid.  Improving after stopping steroid. ?-Recheck in the morning ? ?Euthyroid sick syndrome ?Low TSH but normal free T4.  Unreliable in acute setting but does not suggest hyperthyroidism. ? ?Hyperglycemia ?Likely from steroid.  A1c 5.6%.  Discontinued steroid. ? ?Obesity, Class III, BMI 40-49.9 (morbid obesity) ([place]) ?Body mass index is 41.71 kg/m?. ?Encourage lifestyle change to lose weight. ? ?Hypertension ?On multiple antihypertensive meds but not compliant.  Currently normotensive. ?-Cardiac meds per cardiology as above ? ?Hyponatremia, hypokalemia, hyperkalemia and hyperphosphatemia ?Resolved except hyperphosphatemia to 5.5.  ?-Continue monitoring ? ? ?Anxiety with depression ?Stable.  Continue home Atarax. ? ?Alcohol use disorder ?Reports drinking about 2 of 40 ounce beers a day.  Denies withdrawal symptoms ?-Continue CIWA monitoring with as needed Ativan ?-Continue multivitamin, thiamine and folic acid ? ? ? ? ?DVT prophylaxis:  ?SCDs Start: 10/27/21 2123 ?On IV heparin for atrial fibrillation. ? ?Code Status:  ?Family Communication: None at bedside today. ?Level of care: Telemetry ?Status is: Inpatient ?Remains inpatient appropriate because: A-fib with RVR, acute combined CHF and evaluation for iron deficiency anemia ? ? ?Final disposition: Likely home once medically clear. ? ?Consultants:  ?PCCM ?Cardiology ?Gastroenterology ? ?Sch Meds:   ?Scheduled Meds: ? arformoterol  15 mcg Nebulization BID  ? atorvastatin  20 mg Oral QHS  ? budesonide (PULMICORT) nebulizer solution  0.5 mg Nebulization BID  ? chlorhexidine  15 mL Mouth Rinse BID  ? Chlorhexidine Gluconate Cloth  6 each Topical Daily  ? folic acid  1 mg Oral Daily  ? furosemide  20 mg Intravenous Daily  ? levalbuterol  0.63 mg Nebulization TID  ? mouth rinse  15 mL Mouth Rinse q12n4p  ? metoprolol tartrate  25 mg Oral TID  ? multivitamin with minerals  1 tablet Oral Daily  ? pantoprazole (PROTONIX) IV  40 mg Intravenous Q12H  ? revefenacin  175 mcg Nebulization Daily  ? sacubitril-valsartan  1 tablet Oral BID  ? thiamine  100 mg Oral Daily  ? Or  ? thiamine  100 mg Intravenous Daily  ? ?Continuous Infusions: ? heparin 1,500 Units/hr (11/01/21 0700)  ? ?PRN Meds:.acetaminophen **OR** acetaminophen, HYDROmorphone (DILAUDID) injection, hydrOXYzine, levalbuterol, lip balm, LORazepam **OR** LORazepam, metoprolol tartrate, nitroGLYCERIN, perflutren lipid microspheres (DEFINITY) IV suspension, phenol ? ?Antimicrobials: ?Anti-infectives (From admission, onward)  ? ? Start     Dose/Rate Route Frequency Ordered Stop  ? 10/28/21 2200  azithromycin (ZITHROMAX) tablet 500 mg  Status:  Discontinued       ?  500 mg Oral Daily at bedtime 10/28/21 1416 11/01/21 0748  ? 10/27/21 2130  azithromycin (ZITHROMAX) 500 mg in sodium chloride 0.9 % 250 mL IVPB  Status:  Discontinued       ?Note to Pharmacy: (In setting of acute copd exac)  ? 500 mg ?250 mL/hr over 60 Minutes Intravenous Every 24 hours 10/27/21 2126 10/28/21 1416  ? ?  ? ? ? ?I have personally reviewed the following labs and images: ?CBC: ?Recent Labs  ?Lab 10/27/21 ?1925 10/28/21 ?0020 10/30/21 ?0246 10/30/21 ?0263 10/31/21 ?0251 11/01/21 ?7858  ?WBC 13.1* 13.4* 13.6* 19.0* 23.5* 18.4*  ?NEUTROABS 8.5* 12.5*  --   --   --   --   ?HGB 10.5* 10.3* 9.0* 11.2* 10.7* 9.9*  ?HCT 32.6* 32.1* 28.8* 35.7* 34.1* 30.9*  ?MCV 83.0 82.9 82.5 82.6 82.6 82.2  ?PLT 410*  325 304 449* 364 282  ? ?BMP &GFR ?Recent Labs  ?Lab 10/28/21 ?0020 10/29/21 ?8502 10/30/21 ?0246 10/31/21 ?7741 10/31/21 ?2878 11/01/21 ?0254  ?NA 140 137 136 133* 133* 138  ?K 3.4* 4.0 4.5 5.5* 5.0

## 2021-11-01 NOTE — Progress Notes (Signed)
[facility] Progress Note ? ?[name] [name] 61 y.o. 03-May-1961 ? ?CC:  Iron deficiency anemia ? ? ?Subjective: ?Patient resting in bed this morning.  Had cardioversion yesterday, plan for TEE again today.  Denies abdominal pain, nausea, vomiting.  Denies hematochezia or melena.  Denies hematemesis. ? ?ROS : Review of Systems  ?Constitutional:  Negative for chills and fever.  ?Gastrointestinal:  Negative for abdominal pain, blood in stool, constipation, diarrhea, heartburn, melena, nausea and vomiting.   ? ? ?Objective: ?Vital signs in last 24 hours: ?Vitals:  ? 11/01/21 1140 11/01/21 1200  ?BP:  128/70  ?Pulse:  67  ?Resp:  18  ?Temp: 98.2 ?F (36.8 ?C)   ?SpO2:  92%  ? ? ?Physical Exam: ? ?General:  Alert, cooperative, no distress, appears stated age  ?Head:  Normocephalic, without obvious abnormality, atraumatic  ?Eyes:  Anicteric sclera, EOM's intact  ?Lungs:   Clear to auscultation bilaterally, respirations unlabored  ?Heart:  Regular rate and rhythm, S1, S2 normal  ?Abdomen:   Soft, non-tender, bowel sounds active all four quadrants,  no masses,   ?Extremities: Extremities normal, atraumatic, no  edema  ?Pulses: 2+ and symmetric  ? ? ?Lab Results: ?Recent Labs  ?  10/31/21 ?6967 10/31/21 ?0906 11/01/21 ?8938  ?NA 133* 133* 138  ?K 5.5* 5.0 4.9  ?CL 100 101 103  ?CO2 '24 23 26  '$ ?GLUCOSE 361* 265* 176*  ?BUN 30* 27* 22*  ?CREATININE 0.98 0.87 0.74  ?CALCIUM 8.9 8.8* 8.8*  ?MG 2.3  --  2.3  ?PHOS 5.0*  --  5.5*  ? ?Recent Labs  ?  10/31/21 ?0251 11/01/21 ?0254  ?AST  --  13*  ?ALT  --  23  ?ALKPHOS  --  42  ?BILITOT  --  0.2*  ?PROT  --  6.0*  ?ALBUMIN 3.1* 3.2*  ? ?Recent Labs  ?  10/31/21 ?0251 11/01/21 ?0639  ?WBC 23.5* 18.4*  ?HGB 10.7* 9.9*  ?HCT 34.1* 30.9*  ?MCV 82.6 82.2  ?PLT 364 282  ? ?No results for input(s): LABPROT, INR in the last 72 hours. ? ? ? ?Assessment ?Iron deficiency anemia ?- Hgb 10.5 on admission with drop to 9.0 and report of dark stools 2 weeks ago. ?- Hgb 9.9 today 4/14 ?- MCV  82.2, iron 17, ferritin 11 ?- BUN 22, Cr 0.74, GFR >60 ?- History of gastric ulcer and H. Pylori gastritis as seen on EGD 2016 ?- On IV heparin for A. fib ?  ?Paroxysmal A. Fib ?Acute COPD exacerbation ?Chronic diastolic CHF ?Alcohol use disorder ? ? ?Plan: ?Continue treatment of COPD and A. Fib. ?Will continue to follow and will consider endoscopy/colonoscopy evaluation when cardiopulmonary issues stabilize. ?Continue Protonix. ?Continue supportive care. ?[facility] will follow. ? ?Angelique Holm PA-C ?11/01/2021, 12:50 PM ? ?Contact #  [phone]  ?

## 2021-11-01 NOTE — Progress Notes (Addendum)
Bipap dreamstation machine all set up but pt refused to wear it tonight. She said she doesn't wear anything at home.RN aware.  ?

## 2021-11-01 NOTE — Progress Notes (Signed)
ANTICOAGULATION CONSULT NOTE  ? ?Pharmacy Consult for apixaban ?Indication: atrial fibrillation  ? ?Allergies  ?Allergen Reactions  ? Tetracyclines & Related Itching  ? ? ?Patient Measurements: ?Height: '5\' 3"'$  (160 cm) ?Weight: 110.9 kg (244 lb 8 oz) ?IBW/kg (Calculated) : 52.4 ?Heparin Dosing Weight: 78 kg ? ?Vital Signs: ?Temp: 97.9 ?F (36.6 ?C) (04/14 1418) ?Temp Source: Oral (04/14 1418) ?BP: 131/84 (04/14 1705) ?Pulse Rate: 72 (04/14 1705) ? ?Labs: ?Recent Labs  ?  10/30/21 ?1615 10/30/21 ?3785 10/31/21 ?8850 10/31/21 ?2774 11/01/21 ?1287 11/01/21 ?8676  ?HGB  --  11.2* 10.7*  --   --  9.9*  ?HCT  --  35.7* 34.1*  --   --  30.9*  ?PLT  --  449* 364  --   --  282  ?HEPARINUNFRC 0.49  --  0.50  --   --  0.66  ?CREATININE  --   --  0.98 0.87 0.74  --   ? ? ? ?Estimated Creatinine Clearance: 89.5 mL/min (by C-G formula based on SCr of 0.74 mg/dL). ? ? ?Assessment: ?Patient is a 61 y.o F with hx COPD, PAF and HF presented to the ED on 10/27/21 with c/o SOB.  She went into afib with RVR on 10/29/21 and underwent TEE/DCCV on 10/31/21.  She's currently on heparin drip, and pharmacy is consulted to transition to apixaban.   ? ?Today, 11/01/2021: ?- heparin level is therapeutic at 0.66 ?- hgb but somewhat stable, plts 282K ?- no bleeding documented ? ?Goal of Therapy:  ?Monitor platelets by anticoagulation protocol: Yes ?  ?Plan:  ?- D/C heparin ?- Start apixaban 5 mg PO BID  ?- Pharmacy to provide education/coupon prior to discharge ?- Monitor for s/sx bleeding ? ? ?Gretta Arab PharmD, BCPS ?Clinical Pharmacist ?[facility] main pharmacy [phone] ?11/01/2021 6:56 PM ? ? ? ?

## 2021-11-02 DIAGNOSIS — R739 Hyperglycemia, unspecified: Secondary | ICD-10-CM | POA: Diagnosis not present

## 2021-11-02 DIAGNOSIS — J441 Chronic obstructive pulmonary disease with (acute) exacerbation: Secondary | ICD-10-CM | POA: Diagnosis not present

## 2021-11-02 DIAGNOSIS — E875 Hyperkalemia: Secondary | ICD-10-CM | POA: Diagnosis not present

## 2021-11-02 DIAGNOSIS — I5043 Acute on chronic combined systolic (congestive) and diastolic (congestive) heart failure: Secondary | ICD-10-CM | POA: Diagnosis not present

## 2021-11-02 LAB — APTT: aPTT: 24 seconds (ref 24–36)

## 2021-11-02 LAB — RENAL FUNCTION PANEL
Albumin: 3 g/dL — ABNORMAL LOW (ref 3.5–5.0)
Anion gap: 7 (ref 5–15)
BUN: 16 mg/dL (ref 6–20)
CO2: 28 mmol/L (ref 22–32)
Calcium: 8.1 mg/dL — ABNORMAL LOW (ref 8.9–10.3)
Chloride: 104 mmol/L (ref 98–111)
Creatinine, Ser: 0.59 mg/dL (ref 0.44–1.00)
GFR, Estimated: >60 mL/min (ref >60)
Glucose, Bld: 114 mg/dL — ABNORMAL HIGH (ref 70–99)
Phosphorus: 4.3 mg/dL (ref 2.5–4.6)
Potassium: 3.8 mmol/L (ref 3.5–5.1)
Sodium: 139 mmol/L (ref 135–145)

## 2021-11-02 LAB — CBC
HCT: 31.7 % — ABNORMAL LOW (ref 36.0–46.0)
Hemoglobin: 9.9 g/dL — ABNORMAL LOW (ref 12.0–15.0)
MCH: 25.9 pg — ABNORMAL LOW (ref 26.0–34.0)
MCHC: 31.2 g/dL (ref 30.0–36.0)
MCV: 83 fL (ref 80.0–100.0)
Platelets: 264 10*3/uL (ref 150–400)
RBC: 3.82 MIL/uL — ABNORMAL LOW (ref 3.87–5.11)
RDW: 18.6 % — ABNORMAL HIGH (ref 11.5–15.5)
WBC: 11.6 10*3/uL — ABNORMAL HIGH (ref 4.0–10.5)
nRBC: 0.2 % (ref 0.0–0.2)

## 2021-11-02 LAB — ECHOCARDIOGRAM COMPLETE
Area-P 1/2: 4.57 cm2
Calc EF: 51.3 %
Height: 63 in
P 1/2 time: 496 msec
S' Lateral: 4 cm
Single Plane A2C EF: 46.9 %
Single Plane A4C EF: 55.5 %
Weight: 3908.31 oz

## 2021-11-02 LAB — MAGNESIUM: Magnesium: 2.2 mg/dL (ref 1.7–2.4)

## 2021-11-02 LAB — ECHO TEE: Single Plane A4C EF: 26.6 %

## 2021-11-02 LAB — HEPARIN LEVEL (UNFRACTIONATED): Heparin Unfractionated: >1.1 IU/mL — ABNORMAL HIGH (ref 0.30–0.70)

## 2021-11-02 MED ORDER — POLYETHYLENE GLYCOL 3350 17 G PO PACK: 17.0000 g | PACK | Freq: Two times a day (BID) | ORAL | Status: DC | PRN

## 2021-11-02 MED ORDER — SODIUM CHLORIDE 0.9 % IV SOLN: INTRAVENOUS | Status: DC

## 2021-11-02 MED ORDER — HEPARIN (PORCINE) 25000 UT/250ML-% IV SOLN
1650.0000 [IU]/h | INTRAVENOUS | Status: AC
  Administered 2021-11-02: 1500 [IU]/h via INTRAVENOUS
  Administered 2021-11-03 – 2021-11-04 (×2): 1650 [IU]/h via INTRAVENOUS
  Filled 2021-11-02 (×4): qty 250

## 2021-11-02 MED ORDER — METOPROLOL SUCCINATE ER 50 MG PO TB24
50.0000 mg | ORAL_TABLET | Freq: Every day | ORAL | Status: DC
  Administered 2021-11-02 – 2021-11-05 (×4): 50 mg via ORAL
  Filled 2021-11-02 (×4): qty 1

## 2021-11-02 MED ORDER — PEG 3350-KCL-NA BICARB-NACL 420 G PO SOLR
4000.0000 mL | Freq: Once | ORAL | Status: AC
  Administered 2021-11-03: 4000 mL via ORAL

## 2021-11-02 MED ORDER — HEPARIN BOLUS VIA INFUSION: 1500.0000 [IU] | Freq: Once | INTRAVENOUS | Status: DC

## 2021-11-02 NOTE — Progress Notes (Signed)
?PROGRESS NOTE ? ?[name] [name] PHX:[id number] DOB: 1960/07/25  ? ?PCP: Kerin Perna, NP ? ?Patient is from: Home ? ?DOA: 10/27/2021 LOS: 6 ? ?Chief complaints ?Chief Complaint  ?Patient presents with  ? Shortness of Breath  ?  ? ?Brief Narrative / Interim history: ?61 year old F with PMH of COPD, chronic hypoxic RF on 2 L, A-fib/PVCs, diastolic CHF, alcohol abuse, anxiety, chronic pain, anemia, gastric ulcer morbid obesity presenting with progressive shortness of breath and admitted for acute on chronic hypoxic respiratory failure due to COPD exacerbation and A-fib with RVR.  Initially required BiPAP for hypoxic respiratory failure and work of breathing.  Cardiology and pulmonology consulted.   ? ?Respiratory symptoms improved.  Pulmonology signed off.   ? ?Patient had successful TEE cardioversion for A-fib with RVR on 10/31/2021.  TTE with LVEF of 25 to 30% with global and regional hypokinesis.  TTE ordered.  Cardiology following. ? ?GI consulted for iron deficiency anemia.  However, Hemoccult was negative.  H&H remained stable.  [facility] following.  Planning endoscopy and colonoscopy.  ? ?Subjective: ?Seen and examined earlier this morning.  No major events overnight of this morning.  No complaints.  She denies chest pain, dyspnea, dizziness, palpitation, GI or UTI symptoms. ? ?Objective: ?Vitals:  ? 11/02/21 0554 11/02/21 0555 11/02/21 0900 11/02/21 1206  ?BP:   118/70 100/64  ?Pulse:   65 69  ?Resp:   (!) 21 16  ?Temp:   (!) 97.5 ?F (36.4 ?C) 98.1 ?F (36.7 ?C)  ?TempSrc:   Oral Oral  ?SpO2: 98% 99% 100% 91%  ?Weight:      ?Height:      ? ? ?Examination: ? ?GENERAL: No apparent distress.  Nontoxic. ?HEENT: MMM.  Vision and hearing grossly intact.  ?NECK: Supple.  No apparent JVD.  ?RESP:  No IWOB.  Fair aeration bilaterally. ?CVS:  RRR. Heart sounds normal.  ?ABD/GI/GU: BS+. Abd soft, NTND.  ?MSK/EXT:  Moves extremities. No apparent deformity. No edema.  ?SKIN: no apparent skin lesion or wound ?NEURO: Awake  and alert. Oriented appropriately.  No apparent focal neuro deficit. ?PSYCH: Calm. Normal affect.  ? ?Procedures:  ?4/13-successful TEE cardioversion ? ?Microbiology summarized: ?COVID-19 and influenza PCR nonreactive. ?MRSA PCR screen negative. ? ?Assessment and Plan: ?* COPD with acute exacerbation ([place]) ?Seems to have resolved.  See respiratory failure for detail ? ? ?Iron deficiency anemia ?Recent Labs  ?  05/15/21 ?0438 05/16/21 ?0657 08/22/21 ?1143 10/27/21 ?1925 10/28/21 ?0020 10/30/21 ?0246 10/30/21 ?0165 10/31/21 ?0251 11/01/21 ?5374 11/02/21 ?0357  ?HGB 11.1* 10.6* 13.1 10.5* 10.3* 9.0* 11.2* 10.7* 9.9* 9.9*  ?H&H relatively stable.  Hemoccult negative.  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  Constantly uses ibuprofen.  Anemia panel with iron deficiency.  ?-Continue PPI-changed to daily ?-Received IV ferric gluconate 250 mg x 1 on 4/12.  She had what looks like near syncope an hour after IV iron although it was thought to be vasovagal since it happened while she was on commode ?-Advised against NSAIDs. ?-Monitor intermittently. ?-[facility] following-likely EGD and colonoscopy on Monday. ? ? ? ?Acute on chronic combined CHF ?TEE with LVEF of 25 to 30% with global and regional hypokinesis.  TTE with LVEF of 50 to 55% and G3-DD.  Grossly euvolemic.  BNP in the low 100s.  Seems to be on p.o. Lasix at home. ?-Cardiology managing-started IV Lasix, Entresto, Lipitor and Toprol-XL ?-Monitor fluid and respiratory status ?-Monitor renal functions and electrolytes ? ?Paroxysmal atrial fibrillation (HCC) ?S/p successful TEE cardioversion  on 10/31/2021.  TEE with LVEF of 25 to 30% with global and regional hypokinesis.  TTE with LVEF of 50 to 55% and G3-DD.  TSH low but free T4 within normal. ?-Cardiology managing ?-On Toprol-XL 50 mg daily per cardiology. ?-On heparin for anticoagulation until she has EGD and colonoscopy. ?-Optimize electrolytes ?-She needs sleep study and CPAP ? ? ?Acute on chronic respiratory failure with hypoxia  (HCC) ?In the setting of COPD exacerbation, A-fib with RVR and undiagnosed OSA.  She reports using 2 L at night.  She says she has not had a sleep study.  Respiratory failure seems to have resolved. ?-Wean off oxygen while awake.  Can use home 2 L at night ?-Minimum oxygen to keep saturation above 90% ?-A-fib per cardiology. ? ? ?Lactic acidosis ?Likely from poor formation in the setting of A-fib with RVR.  Blood pressure stable.  Resolved. ? ?Bandemia ?Likely demargination from steroid.  Improved after discontinuing steroid. ? ?Euthyroid sick syndrome ?Low TSH but normal free T4.  Unreliable in acute setting but does not suggest hyperthyroidism. ? ?Hyperglycemia ?Likely from steroid.  A1c 5.6%.  Improved after discontinuing the steroid. ? ?Obesity, Class III, BMI 40-49.9 (morbid obesity) ([place]) ?Body mass index is 41.71 kg/m?. ?Encourage lifestyle change to lose weight. ? ?Hypertension ?On multiple antihypertensive meds but not compliant.  Currently normotensive. ?-Cardiac meds per cardiology as above ? ?Hyponatremia, hypokalemia, hyperkalemia and hyperphosphatemia ?Resolved.  Continue monitoring. ? ? ?Anxiety with depression ?Stable.  Continue home Atarax. ? ?Alcohol use disorder ?Reports drinking about 2 of 40 ounce beers a day.  Denies withdrawal symptoms ?-Discontinue CIWA. ? ? ? ? ?DVT prophylaxis:  ?SCDs Start: 10/27/21 2123 ?On IV heparin for atrial fibrillation. ? ?Code Status:  ?Family Communication: None at bedside today. ?Level of care: Telemetry ?Status is: Inpatient ?Remains inpatient appropriate because: A-fib with RVR, acute combined CHF and evaluation for iron deficiency anemia with endoscopy and colonoscopy ? ? ?Final disposition: Likely home once medically clear. ? ?Consultants:  ?PCCM ?Cardiology ?Gastroenterology ? ?Sch Meds:  ?Scheduled Meds: ? arformoterol  15 mcg Nebulization BID  ? atorvastatin  20 mg Oral QHS  ? budesonide (PULMICORT) nebulizer solution  0.5 mg Nebulization BID  ?  chlorhexidine  15 mL Mouth Rinse BID  ? furosemide  20 mg Intravenous Daily  ? levalbuterol  0.63 mg Nebulization BID  ? mouth rinse  15 mL Mouth Rinse q12n4p  ? metoprolol succinate  50 mg Oral Daily  ? pantoprazole (PROTONIX) IV  40 mg Intravenous Q12H  ? [START ON 11/03/2021] polyethylene glycol-electrolytes  4,000 mL Oral Once  ? revefenacin  175 mcg Nebulization Daily  ? sacubitril-valsartan  1 tablet Oral BID  ? ?Continuous Infusions: ? sodium chloride    ? sodium chloride    ? heparin    ? ?PRN Meds:.acetaminophen **OR** acetaminophen, HYDROmorphone (DILAUDID) injection, hydrOXYzine, levalbuterol, lip balm, nitroGLYCERIN, phenol, polyethylene glycol ? ?Antimicrobials: ?Anti-infectives (From admission, onward)  ? ? Start     Dose/Rate Route Frequency Ordered Stop  ? 10/28/21 2200  azithromycin (ZITHROMAX) tablet 500 mg  Status:  Discontinued       ? 500 mg Oral Daily at bedtime 10/28/21 1416 11/01/21 0748  ? 10/27/21 2130  azithromycin (ZITHROMAX) 500 mg in sodium chloride 0.9 % 250 mL IVPB  Status:  Discontinued       ?Note to Pharmacy: (In setting of acute copd exac)  ? 500 mg ?250 mL/hr over 60 Minutes Intravenous Every 24 hours 10/27/21 2126 10/28/21 1416  ? ?  ? ? ? ?  I have personally reviewed the following labs and images: ?CBC: ?Recent Labs  ?Lab 10/27/21 ?1925 10/28/21 ?0020 10/30/21 ?0246 10/30/21 ?4239 10/31/21 ?0251 11/01/21 ?5320 11/02/21 ?0357  ?WBC 13.1* 13.4* 13.6* 19.0* 23.5* 18.4* 11.6*  ?NEUTROABS 8.5* 12.5*  --   --   --   --   --   ?HGB 10.5* 10.3* 9.0* 11.2* 10.7* 9.9* 9.9*  ?HCT 32.6* 32.1* 28.8* 35.7* 34.1* 30.9* 31.7*  ?MCV 83.0 82.9 82.5 82.6 82.6 82.2 83.0  ?PLT 410* 325 304 449* 364 282 264  ? ?BMP &GFR ?Recent Labs  ?Lab 10/28/21 ?0020 10/29/21 ?2334 10/30/21 ?0246 10/31/21 ?3568 10/31/21 ?6168 11/01/21 ?3729 11/02/21 ?0211 11/02/21 ?1552  ?NA 140 137 136 133* 133* 138  --  139  ?K 3.4* 4.0 4.5 5.5* 5.0 4.9  --  3.8  ?CL 99 100 101 100 101 103  --  104  ?CO2 '28 27 27 24 23 26  '$ --  28   ?GLUCOSE 176* 203* 234* 361* 265* 176*  --  114*  ?BUN '9 19 19 '$ 30* 27* 22*  --  16  ?CREATININE 0.59 0.61 0.69 0.98 0.87 0.74  --  0.59  ?CALCIUM 9.5 9.6 9.1 8.9 8.8* 8.8*  --  8.1*  ?MG 2.3 2.0 2.0 2.3  --  2.3 2

## 2021-11-02 NOTE — Progress Notes (Addendum)
[name] ?11:50 AM ? ?Subjective: ?Patient doing much better in her hospital computer chart was reviewed and the patient was seen and examined and case discussed with her daughter and my partner Dr. Paulita Fujita and she has no GI complaints and has not seen any blood ? ?Objective: ?Vital signs stable afebrile no acute distress abdomen is soft nontender no new labs ? ?Assessment: ?Iron deficiency patient overdue for colonoscopy ? ?Plan: ?We will plan colonoscopy and endoscopy on Monday by Dr. Deno Etienne and the procedures were discussed including the risks with the patient and her daughter and will keep on clear liquids for the rest of today and begin prep in the morning with further work-up and plans pending those findings on Monday and will change her Eliquis back to heparin however if cardiology does not want [place] to proceed please let me know and we are happy to proceed as an outpatient when okay by them and if we are not going to proceed happy to cancel orders and allow her to eat ? ?[facility] ? ?office [phone] ?After 5PM or if no answer call [phone]  ?

## 2021-11-02 NOTE — Progress Notes (Signed)
Pt refused to wear bipap again tonight. Machine standby.  ?

## 2021-11-02 NOTE — Progress Notes (Addendum)
ANTICOAGULATION CONSULT NOTE  ? ?Pharmacy Consult for apixaban >> heparin drip ?Indication: atrial fibrillation  ? ?Allergies  ?Allergen Reactions  ? Tetracyclines & Related Itching  ? ? ?Patient Measurements: ?Height: '5\' 3"'$  (160 cm) ?Weight: 109.6 kg (241 lb 9.6 oz) ?IBW/kg (Calculated) : 52.4 ?Heparin Dosing Weight: 78 kg ? ?Vital Signs: ?Temp: 98.1 ?F (36.7 ?C) (04/15 1206) ?Temp Source: Oral (04/15 1206) ?BP: 100/64 (04/15 1206) ?Pulse Rate: 69 (04/15 1206) ? ?Labs: ?Recent Labs  ?  10/30/21 ?1615 10/30/21 ?2353 10/31/21 ?6144 10/31/21 ?3154 11/01/21 ?0086 11/01/21 ?7619 11/02/21 ?5093 11/02/21 ?2671  ?HGB  --    < > 10.7*  --   --  9.9* 9.9*  --   ?HCT  --    < > 34.1*  --   --  30.9* 31.7*  --   ?PLT  --    < > 364  --   --  282 264  --   ?HEPARINUNFRC 0.49  --  0.50  --   --  0.66  --   --   ?CREATININE  --    < > 0.98 0.87 0.74  --   --  0.59  ? < > = values in this interval not displayed.  ? ? ? ?Estimated Creatinine Clearance: 88.9 mL/min (by C-G formula based on SCr of 0.59 mg/dL). ? ? ?Assessment: ?Patient is a 61 y.o F with hx COPD, PAF and HF presented to the ED on 10/27/21 with c/o SOB.  She went into afib with RVR on 10/29/21 and underwent TEE/DCCV on 10/31/21.  She was transitioned to Eliquis on 4/14.  Pharmacy has been consulted to transition Eliquis back to heparin drip in anticipation for endoscopy and colonoscopy on Monday, 4/17. ? ?Today, 11/02/2021: ?- Last dose of Eliquis 4/15 '@1009'$  ?- Previously therapeutic on heparin at 1500 units/hr, will resume heparin drip at this rate this evening. Defer bolus since recent Eliquis administration. ?- CBC stable this morning ? ?Goal of Therapy:  ?Monitor platelets by anticoagulation protocol: Yes ?  ?Plan:  ?- Discontinue Eliquis ?- Resume heparin infusion at 1500 units/hr this evening at 2200  ?- Follow-up heparin level and aPTT now, although anticipate will need to dose heparin infusion off aPTTs given recent Eliquis dose ?- Check aPTT 6 hours from start of  heparin infusion ?- Daily CBC ?- F/u resume Eliquis after procedures on 4/17 ? ?HOLD heparin infusion on 4/17 @ 0700 per GI - plan for procedure ~1300  ? ?Dimple Nanas, PharmD ?11/02/2021 12:33 PM ? ?

## 2021-11-02 NOTE — Progress Notes (Signed)
?   11/02/21 1013  ?Provider Notification  ?Provider Name/Title Wendee Beavers, MD  ?Date Provider Notified 11/02/21  ?Time Provider Notified 1014  ?Method of Notification Page  ?Notification Reason Other (Comment) ?(7 beat run Vtach)  ?Provider response No new orders  ?Date of Provider Response 11/02/21  ?Time of Provider Response 1027  ? ?Patient assessed: asymptomatic, no change in status.  Cardiology DO also made aware. ? ?Angie Fava, RN  ?

## 2021-11-02 NOTE — Progress Notes (Signed)
Progress Note ? ?Patient Name: [name] [name] ?Date of Encounter: 11/02/2021 ? ?Attending physician: Mercy Riding, MD ?Primary care provider: Kerin Perna, NP ? ?Subjective: ?[name] is a 61 y.o. female who was seen and examined at bedside  ?Patient resting in bed comfortably. ?Denies any chest pain, shortness of breath, orthopnea, paroxysmal nocturnal dyspnea or lower extremity swelling. ?Remains in normal sinus rhythm. ?Case discussed and reviewed with her nurse. ? ?Objective: ?Vital Signs in the last 24 hours: ?Temp:  [97.5 ?F (36.4 ?C)-98.7 ?F (37.1 ?C)] 98.1 ?F (36.7 ?C) (04/15 1206) ?Pulse Rate:  [65-78] 69 (04/15 1206) ?Resp:  [16-22] 16 (04/15 1206) ?BP: (100-135)/(61-84) 100/64 (04/15 1206) ?SpO2:  [91 %-100 %] 91 % (04/15 1206) ?Weight:  [109.6 kg-110.9 kg] 109.6 kg (04/15 0500) ? ?Intake/Output: ? ?Intake/Output Summary (Last 24 hours) at 11/02/2021 1348 ?Last data filed at 11/02/2021 7628 ?Gross per 24 hour  ?Intake 358 ml  ?Output 900 ml  ?Net -542 ml  ?  ?Net IO Since Admission: 4,564.09 mL [11/02/21 1348] ? ?Weights:  ?Filed Weights  ? 11/01/21 0500 11/01/21 1418 11/02/21 0500  ?Weight: 110.8 kg 110.9 kg 109.6 kg  ? ? ?Telemetry: Personally reviewed.  Predominately sinus rhythm with rare PVCs and 7beat NSVT (this morning while napping). ? ?Physical examination: ?PHYSICAL EXAM: ? ?  11/02/2021  ? 12:06 PM 11/02/2021  ?  9:00 AM 11/02/2021  ?  5:06 AM  ?Vitals with BMI  ?Systolic 315 176 160  ?Diastolic 64 70 61  ?Pulse 69 65 66  ? ?General: Age-appropriate, hemodynamically stable, no acute distress. ?Skin: Warm to touch, no rashes or jaundice. ?HEENT: Normocephalic, atraumatic, no scleral icterus, moist mucous membranes, no JVP or carotid bruit. ?Chest: Equal rise and fall of chest cavity, no intercostal retractions. ?Lungs: Clear to auscultation bilaterally.  Surgical scar noted over the left lateral chest wall. ?Heart: Regular, positive S1-S2, no murmurs rubs or gallops appreciated. ?Abdomen:  Obese, soft, nontender, nondistended, positive bowel sounds in all 4 quadrants. ?Extremity: No pitting edema: 2+ DP and PT pulses, warm to touch. ? ? ?Lab Results: ?Hematology ?Recent Labs  ?Lab 10/31/21 ?0251 11/01/21 ?7371 11/02/21 ?0626  ?WBC 23.5* 18.4* 11.6*  ?RBC 4.13 3.76* 3.82*  ?HGB 10.7* 9.9* 9.9*  ?HCT 34.1* 30.9* 31.7*  ?MCV 82.6 82.2 83.0  ?MCH 25.9* 26.3 25.9*  ?MCHC 31.4 32.0 31.2  ?RDW 18.5* 18.4* 18.6*  ?PLT 364 282 264  ? ? ?Chemistry ?Recent Labs  ?Lab 10/28/21 ?0020 10/29/21 ?9485 10/31/21 ?4627 10/31/21 ?0350 11/01/21 ?0938 11/02/21 ?1829  ?NA 140   < > 133* 133* 138 139  ?K 3.4*   < > 5.5* 5.0 4.9 3.8  ?CL 99   < > 100 101 103 104  ?CO2 28   < > '24 23 26 28  '$ ?GLUCOSE 176*   < > 361* 265* 176* 114*  ?BUN 9   < > 30* 27* 22* 16  ?CREATININE 0.59   < > 0.98 0.87 0.74 0.59  ?CALCIUM 9.5   < > 8.9 8.8* 8.8* 8.1*  ?PROT 7.7  --   --   --  6.0*  --   ?ALBUMIN 3.9  --  3.1*  --  3.2* 3.0*  ?AST 21  --   --   --  13*  --   ?ALT 19  --   --   --  23  --   ?ALKPHOS 76  --   --   --  42  --   ?  BILITOT 0.8  --   --   --  0.2*  --   ?GFRNONAA >60   < > >60 >60 >60 >60  ?ANIONGAP 13   < > '9 9 9 7  '$ ? < > = values in this interval not displayed.  ?  ? ?Cardiac Enzymes: ?Cardiac Panel (last 3 results) ?No results for input(s): CKTOTAL, CKMB, TROPONINIHS, RELINDX in the last 72 hours. ? ?BNP (last 3 results) ?Recent Labs  ?  05/14/21 ?0654 10/27/21 ?1925 11/01/21 ?0254  ?BNP 150.6* 173.6* 105.9*  ? ? ?ProBNP (last 3 results) ?No results for input(s): PROBNP in the last 8760 hours. ? ? ?DDimer No results for input(s): DDIMER in the last 168 hours.  ? ?Hemoglobin A1c:  ?Lab Results  ?Component Value Date  ? HGBA1C 5.6 10/30/2021  ? MPG 114.02 10/30/2021  ? ? ?TSH  ?Recent Labs  ?  03/12/21 ?0901 10/29/21 ?1047  ?TSH CANCELED 0.339*  ? ? ?Lipid Panel  ?   ?Component Value Date/Time  ? CHOL 181 08/22/2021 1143  ? TRIG 118 08/22/2021 1143  ? HDL 58 08/22/2021 1143  ? CHOLHDL 3.1 08/22/2021 1143  ? CHOLHDL 2.4  03/15/2010 0447  ? VLDL 12 03/15/2010 0447  ? LDLCALC 102 (H) 08/22/2021 1143  ? ? ?Imaging: ?ECHOCARDIOGRAM COMPLETE ? ?Result Date: 11/02/2021 ?   ECHOCARDIOGRAM REPORT   Patient Name:   [name] Date of Exam: 11/01/2021 Medical Rec #:  [id number]     Height:       63.0 in Accession #:    [id number]    Weight:       244.3 lb Date of Birth:  March 23, 1961      BSA:          2.105 m? Patient Age:    32 years      BP:           128/70 mmHg Patient Gender: F             HR:           66 bpm. Exam Location:  Inpatient Procedure: 2D Echo, Cardiac Doppler, Color Doppler and Intracardiac            Opacification Agent Indications:    Atrial Fibrillation I48.91  History:        Patient has prior history of Echocardiogram examinations, most                 recent 03/19/2021. CHF, Arrythmias:Atrial Fibrillation,                 Signs/Symptoms:Shortness of Breath; Risk Factors:Hypertension.  Sonographer:    Darlina Sicilian RDCS Referring Phys: [id number] [place]  1. Left ventricular ejection fraction, by estimation, is 50 to 55%. The left ventricle has low normal function. The left ventricle has no regional wall motion abnormalities. There is mild left ventricular hypertrophy. Left ventricular diastolic parameters are consistent with Grade III diastolic dysfunction (restrictive). Elevated left atrial pressure.  2. Right ventricular systolic function is normal. The right ventricular size is normal. There is mildly elevated pulmonary artery systolic pressure.  3. Left atrial size was mildly dilated.  4. The mitral valve is grossly normal. Mild mitral valve regurgitation. No evidence of mitral stenosis.  5. The aortic valve is grossly normal. Aortic valve regurgitation is mild. No aortic stenosis is present.  6. The inferior vena cava is normal in size with greater than 50% respiratory variability, suggesting right atrial pressure of 3  mmHg. FINDINGS  Left Ventricle: Left ventricular ejection fraction, by estimation, is  50 to 55%. The left ventricle has low normal function. The left ventricle has no regional wall motion abnormalities. Definity contrast agent was given IV to delineate the left ventricular endocardial borders. The left ventricular internal cavity size was normal in size. There is mild left ventricular hypertrophy. Left ventricular diastolic parameters are consistent with Grade III diastolic dysfunction (restrictive). Elevated left atrial pressure. The E/e' is 12. Right Ventricle: The right ventricular size is normal. No increase in right ventricular wall thickness. Right ventricular systolic function is normal. There is mildly elevated pulmonary artery systolic pressure. The tricuspid regurgitant velocity is 3.05  m/s, and with an assumed right atrial pressure of 3 mmHg, the estimated right ventricular systolic pressure is 71.6 mmHg. Left Atrium: Left atrial size was mildly dilated. Right Atrium: Right atrial size was normal in size. Pericardium: There is no evidence of pericardial effusion. Mitral Valve: The mitral valve is grossly normal. Mild mitral valve regurgitation. No evidence of mitral valve stenosis. Tricuspid Valve: The tricuspid valve is normal in structure. Tricuspid valve regurgitation is mild . No evidence of tricuspid stenosis. Aortic Valve: The aortic valve is grossly normal. Aortic valve regurgitation is mild. Aortic regurgitation PHT measures 496 msec. No aortic stenosis is present. Pulmonic Valve: The pulmonic valve was not well visualized. Pulmonic valve regurgitation is mild. No evidence of pulmonic stenosis. Aorta: The aortic root and ascending aorta are structurally normal, with no evidence of dilitation. Venous: The inferior vena cava is normal in size with greater than 50% respiratory variability, suggesting right atrial pressure of 3 mmHg. IAS/Shunts: The interatrial septum was not well visualized.  LEFT VENTRICLE PLAX 2D LVIDd:         5.30 cm      Diastology LVIDs:         4.00 cm      LV  e' medial:    5.78 cm/s LV PW:         0.90 cm      LV E/e' medial:  20.8 LV IVS:        1.10 cm      LV e' lateral:   6.68 cm/s LVOT diam:     2.10 cm      LV E/e' lateral: 18.0 LV SV:         58 LV SV Inde

## 2021-11-03 DIAGNOSIS — J441 Chronic obstructive pulmonary disease with (acute) exacerbation: Secondary | ICD-10-CM | POA: Diagnosis not present

## 2021-11-03 DIAGNOSIS — I5043 Acute on chronic combined systolic (congestive) and diastolic (congestive) heart failure: Secondary | ICD-10-CM | POA: Diagnosis not present

## 2021-11-03 DIAGNOSIS — E875 Hyperkalemia: Secondary | ICD-10-CM | POA: Diagnosis not present

## 2021-11-03 DIAGNOSIS — R739 Hyperglycemia, unspecified: Secondary | ICD-10-CM | POA: Diagnosis not present

## 2021-11-03 LAB — APTT
aPTT: 56 seconds — ABNORMAL HIGH (ref 24–36)
aPTT: 88 seconds — ABNORMAL HIGH (ref 24–36)
aPTT: 97 seconds — ABNORMAL HIGH (ref 24–36)

## 2021-11-03 LAB — RENAL FUNCTION PANEL
Albumin: 3 g/dL — ABNORMAL LOW (ref 3.5–5.0)
Anion gap: 6 (ref 5–15)
BUN: 9 mg/dL (ref 6–20)
CO2: 34 mmol/L — ABNORMAL HIGH (ref 22–32)
Calcium: 8.5 mg/dL — ABNORMAL LOW (ref 8.9–10.3)
Chloride: 101 mmol/L (ref 98–111)
Creatinine, Ser: 0.57 mg/dL (ref 0.44–1.00)
GFR, Estimated: >60 mL/min (ref >60)
Glucose, Bld: 106 mg/dL — ABNORMAL HIGH (ref 70–99)
Phosphorus: 4.1 mg/dL (ref 2.5–4.6)
Potassium: 3.8 mmol/L (ref 3.5–5.1)
Sodium: 141 mmol/L (ref 135–145)

## 2021-11-03 LAB — CBC
HCT: 32.3 % — ABNORMAL LOW (ref 36.0–46.0)
Hemoglobin: 10.4 g/dL — ABNORMAL LOW (ref 12.0–15.0)
MCH: 26.5 pg (ref 26.0–34.0)
MCHC: 32.2 g/dL (ref 30.0–36.0)
MCV: 82.2 fL (ref 80.0–100.0)
Platelets: 290 10*3/uL (ref 150–400)
RBC: 3.93 MIL/uL (ref 3.87–5.11)
RDW: 18.8 % — ABNORMAL HIGH (ref 11.5–15.5)
WBC: 11.6 10*3/uL — ABNORMAL HIGH (ref 4.0–10.5)
nRBC: 0 % (ref 0.0–0.2)

## 2021-11-03 LAB — HEPARIN LEVEL (UNFRACTIONATED): Heparin Unfractionated: >1.1 IU/mL — ABNORMAL HIGH (ref 0.30–0.70)

## 2021-11-03 LAB — MAGNESIUM: Magnesium: 2 mg/dL (ref 1.7–2.4)

## 2021-11-03 MED ORDER — POTASSIUM CHLORIDE CRYS ER 20 MEQ PO TBCR
40.0000 meq | EXTENDED_RELEASE_TABLET | Freq: Once | ORAL | Status: AC
Start: 2021-11-03 — End: 2021-11-03
  Administered 2021-11-03: 40 meq via ORAL
  Filled 2021-11-03: qty 2

## 2021-11-03 NOTE — Progress Notes (Signed)
[name] ?9:48 AM ? ?Subjective: ?Patient without any new complaints and we rediscussed her procedures tomorrow and discussed her case with her nurse ? ?Objective: ?Vital signs stable afebrile no acute distress patient not reexamined today Labs stable ? ?Assessment: ?Iron deficiency ? ?Plan: ?Okay to proceed with endoscopy and colonoscopy tomorrow and please turn off heparin drip at 7 AM as I had discussed with the pharmacy team ? ?[facility] E ? ?office [phone] ?After 5PM or if no answer call [phone]  ?

## 2021-11-03 NOTE — H&P (View-Only) (Signed)
[name] ?9:48 AM ? ?Subjective: ?Patient without any new complaints and we rediscussed her procedures tomorrow and discussed her case with her nurse ? ?Objective: ?Vital signs stable afebrile no acute distress patient not reexamined today Labs stable ? ?Assessment: ?Iron deficiency ? ?Plan: ?Okay to proceed with endoscopy and colonoscopy tomorrow and please turn off heparin drip at 7 AM as I had discussed with the pharmacy team ? ?[facility] E ? ?office [phone] ?After 5PM or if no answer call [phone]  ?

## 2021-11-03 NOTE — Plan of Care (Signed)
  Problem: Elimination: Goal: Will not experience complications related to bowel motility Outcome: Progressing Goal: Will not experience complications related to urinary retention Outcome: Progressing   

## 2021-11-03 NOTE — Progress Notes (Signed)
ANTICOAGULATION CONSULT NOTE  ? ?Pharmacy Consult for apixaban >> heparin drip ?Indication: atrial fibrillation  ? ?Allergies  ?Allergen Reactions  ? Sodium Ferric Gluconate [Ferrous Gluconate] Other (See Comments)  ?  Patient had near syncope with abdominal pain and diaphoresis about an hour after IV ferric gluconate although it was while she was on commode having bowel movements  ? Tetracyclines & Related Itching  ? ? ?Patient Measurements: ?Height: '5\' 3"'$  (160 cm) ?Weight: 109.6 kg (241 lb 9.6 oz) ?IBW/kg (Calculated) : 52.4 ?Heparin Dosing Weight: 78 kg ? ?Vital Signs: ?Temp: 98.7 ?F (37.1 ?C) (04/16 0300) ?Temp Source: Oral (04/16 0300) ?BP: 126/74 (04/16 0300) ?Pulse Rate: 62 (04/16 0300) ? ?Labs: ?Recent Labs  ?  10/31/21 ?0906 11/01/21 ?0254 11/01/21 ?0639 11/01/21 ?1975 11/02/21 ?0357 11/02/21 ?0749 11/02/21 ?1251 11/03/21 ?0338  ?HGB  --   --  9.9*   < > 9.9*  --   --  10.4*  ?HCT  --   --  30.9*  --  31.7*  --   --  32.3*  ?PLT  --   --  282  --  264  --   --  290  ?APTT  --   --   --   --   --   --  24 56*  ?HEPARINUNFRC  --   --  0.66  --   --   --  >1.10* >1.10*  ?CREATININE 0.87 0.74  --   --   --  0.59  --   --   ? < > = values in this interval not displayed.  ? ? ? ?Estimated Creatinine Clearance: 88.9 mL/min (by C-G formula based on SCr of 0.59 mg/dL). ? ? ?Assessment: ?Patient is a 61 y.o F with hx COPD, PAF and HF presented to the ED on 10/27/21 with c/o SOB.  She went into afib with RVR on 10/29/21 and underwent TEE/DCCV on 10/31/21.  She was transitioned to Eliquis on 4/14.  Pharmacy has been consulted to transition Eliquis back to heparin drip in anticipation for endoscopy and colonoscopy on Monday, 4/17.  Last dose of Eliquis 4/15 '@1009'$  ? ?Today, 11/03/2021: ?- APTT  56 sec- subtherapeutic on IV heparin 1500 units/hr ?- Heparin level >1.1 - elevated as anticipated due to recent Eliquis doses.  Will monitor heparin therapy using aptt levels until these 2 levels correlate ?- CBC: Hg 10.4- low but  slightly improved from previous days, pltc WNL ?- No bleeding or infusion related concerns reported by nursing ? ?Goal of Therapy:  ?Heparin level 0.3-0.7 ?APTT 66-102s ?Monitor platelets by anticoagulation protocol: Yes ?  ?Plan:  ?- Slightly increase heparin infusion to 1650 units/hr  ?- Check aPTT 6 hours from rate change ?- Daily CBC & heparin level ?- F/u resume Eliquis after procedures on 4/17 ? ?HOLD heparin infusion on 4/17 @ 0700 per GI - plan for procedure ~1300  ? ?Netta Cedars, PharmD ?11/03/2021 5:39 AM ? ?

## 2021-11-03 NOTE — Progress Notes (Signed)
ANTICOAGULATION CONSULT NOTE  ? ?Pharmacy Consult for apixaban >> heparin drip ?Indication: atrial fibrillation  ? ?Allergies  ?Allergen Reactions  ? Sodium Ferric Gluconate [Ferrous Gluconate] Other (See Comments)  ?  Patient had near syncope with abdominal pain and diaphoresis about an hour after IV ferric gluconate although it was while she was on commode having bowel movements  ? Tetracyclines & Related Itching  ? ? ?Patient Measurements: ?Height: '5\' 3"'$  (160 cm) ?Weight: 109.6 kg (241 lb 9.6 oz) ?IBW/kg (Calculated) : 52.4 ?Heparin Dosing Weight: 78 kg ? ?Vital Signs: ?Temp: 97.6 ?F (36.4 ?C) (04/16 1932) ?Temp Source: Oral (04/16 1932) ?BP: 136/72 (04/16 1932) ?Pulse Rate: 70 (04/16 1932) ? ?Labs: ?Recent Labs  ?  11/01/21 ?0254 11/01/21 ?0639 11/01/21 ?0639 11/02/21 ?0357 11/02/21 ?0749 11/02/21 ?1251 11/02/21 ?1251 11/03/21 ?0338 11/03/21 ?1411 11/03/21 ?2007  ?HGB  --  9.9*   < > 9.9*  --   --   --  10.4*  --   --   ?HCT  --  30.9*  --  31.7*  --   --   --  32.3*  --   --   ?PLT  --  282  --  264  --   --   --  290  --   --   ?APTT  --   --   --   --   --  24   < > 56* 88* 97*  ?HEPARINUNFRC  --  0.66  --   --   --  >1.10*  --  >1.10*  --   --   ?CREATININE 0.74  --   --   --  0.59  --   --  0.57  --   --   ? < > = values in this interval not displayed.  ? ? ? ?Estimated Creatinine Clearance: 88.9 mL/min (by C-G formula based on SCr of 0.57 mg/dL). ? ? ?Assessment: ?Patient is a 61 y.o F with hx COPD, PAF and HF presented to the ED on 10/27/21 with c/o SOB.  She went into afib with RVR on 10/29/21 and underwent TEE/DCCV on 10/31/21.  She was transitioned to Eliquis on 4/14.  Pharmacy has been consulted to transition Eliquis back to heparin drip in anticipation for endoscopy and colonoscopy on Monday, 4/17.  Last dose of Eliquis 4/15 '@1009'$  ? ?Today, 11/03/2021: ?- APTT  97 sec- therapeutic on IV heparin 1650 units/hr ?- Heparin level >1.1 - elevated as anticipated due to recent Eliquis doses.  Will monitor heparin  therapy using aptt levels until these 2 levels correlate ?- CBC: Hg 10.4- low but slightly improved from previous days, pltc WNL ?- No bleeding or infusion related concerns reported by nursing ? ?Goal of Therapy:  ?Heparin level 0.3-0.7 ?APTT 66-102s ?Monitor platelets by anticoagulation protocol: Yes ?  ?Plan:  ?- Continue heparin infusion at 1650 units/hr  ?- Daily CBC, heparin level, and aPTT ?- F/u resume Eliquis after procedures on 4/17 ? ?HOLD heparin infusion on 4/17 @ 0700 per GI - plan for procedure ~1300  ? ?Dimple Nanas, PharmD ?11/03/2021 8:32 PM ? ? ?

## 2021-11-03 NOTE — Progress Notes (Signed)
?PROGRESS NOTE ? ?[name] [name] DXA:[id number] DOB: 01-Sep-1960  ? ?PCP: Kerin Perna, NP ? ?Patient is from: Home ? ?DOA: 10/27/2021 LOS: 7 ? ?Chief complaints ?Chief Complaint  ?Patient presents with  ? Shortness of Breath  ?  ? ?Brief Narrative / Interim history: ?61 year old F with PMH of COPD, chronic hypoxic RF on 2 L, A-fib/PVCs, diastolic CHF, alcohol abuse, anxiety, chronic pain, anemia, gastric ulcer morbid obesity presenting with progressive shortness of breath and admitted for acute on chronic hypoxic respiratory failure due to COPD exacerbation and A-fib with RVR.  Initially required BiPAP for hypoxic respiratory failure and work of breathing.  Cardiology and pulmonology consulted.   ? ?Respiratory symptoms improved.  Pulmonology signed off.   ? ?Patient had successful TEE cardioversion for A-fib with RVR on 10/31/2021.  TTE with LVEF of 25 to 30% with global and regional hypokinesis.  TTE ordered.  Cardiology following. ? ?GI consulted for iron deficiency anemia.  However, Hemoccult was negative.  H&H remained stable.  [facility] following.  Plan for endoscopy and colonoscopy on 4/17.  ? ?Subjective: ?Seen and examined earlier this morning.  No major events overnight of this morning.  She was sitting on bedside chair.  No complaints.  She denies pain, shortness of breath, palpitation or dizziness. ? ?Objective: ?Vitals:  ? 11/03/21 0300 11/03/21 0753 11/03/21 1000 11/03/21 1001  ?BP: 126/74   113/85  ?Pulse: 62   67  ?Resp: 16   16  ?Temp: 98.7 ?F (37.1 ?C)   97.8 ?F (36.6 ?C)  ?TempSrc: Oral   Oral  ?SpO2: 100% 94% 97% 99%  ?Weight:      ?Height:      ? ? ?Examination: ? ?GENERAL: No apparent distress.  Nontoxic. ?HEENT: MMM.  Vision and hearing grossly intact.  ?NECK: Supple.  No apparent JVD.  ?RESP:  No IWOB.  Fair aeration bilaterally. ?CVS:  RRR. Heart sounds normal.  ?ABD/GI/GU: BS+. Abd soft, NTND.  ?MSK/EXT:  Moves extremities. No apparent deformity. No edema.  ?SKIN: no apparent skin lesion or  wound ?NEURO: Awake and alert. Oriented appropriately.  No apparent focal neuro deficit. ?PSYCH: Calm. Normal affect.  ? ?Procedures:  ?4/13-successful TEE cardioversion ? ?Microbiology summarized: ?COVID-19 and influenza PCR nonreactive. ?MRSA PCR screen negative. ? ?Assessment and Plan: ?* COPD with acute exacerbation ([place]) ?Seems to have resolved.  See respiratory failure for detail ? ? ?Iron deficiency anemia ?Recent Labs  ?  05/16/21 ?0657 08/22/21 ?1143 10/27/21 ?1925 10/28/21 ?0020 10/30/21 ?0246 10/30/21 ?2094 10/31/21 ?7096 11/01/21 ?2836 11/02/21 ?6294 11/03/21 ?7654  ?HGB 10.6* 13.1 10.5* 10.3* 9.0* 11.2* 10.7* 9.9* 9.9* 10.4*  ?H&H relatively stable.  Hemoccult negative.  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  Constantly uses ibuprofen.  Anemia panel with iron deficiency.  ?-Continue PPI-changed to daily ?-Had what looks like vasovagal episode while on commode about an hour after IV iron.  ?-Advised against NSAIDs. ?-Monitor intermittently. ?-[facility] following-likely EGD and colonoscopy on Monday. ? ? ? ?Acute on chronic combined CHF ?TEE with LVEF of 25 to 30% with global and regional hypokinesis.  TTE with LVEF of 50 to 55% and G3-DD.  Grossly euvolemic.  BNP in the low 100s.  Seems to be on p.o. Lasix at home.  Started on IV Lasix.  About 2.5 L UOP/24 hours.  Still net +3.1 L. ?-Cardiology managing-started IV Lasix, Entresto, Lipitor and Toprol-XL ?-Monitor fluid and respiratory status ?-Monitor renal functions and electrolytes ? ?Paroxysmal atrial fibrillation (HCC) ?S/p successful TEE cardioversion  on 10/31/2021.  TEE with LVEF of 25 to 30% with global and regional hypokinesis.  TTE with LVEF of 50 to 55% and G3-DD.  TSH low but free T4 within normal. ?-Cardiology managing ?-On Toprol-XL 50 mg daily per cardiology. ?-On heparin for anticoagulation until she has EGD and colonoscopy. ?-Optimize electrolytes ?-She needs sleep study and CPAP ? ? ?Acute on chronic respiratory failure with hypoxia (HCC) ?In the setting  of COPD exacerbation, A-fib with RVR and undiagnosed OSA.  She reports using 2 L at night.  She says she has not had a sleep study.  Respiratory failure resolved. ?-Encourage trying CPAP and instead of oxygen at night ?-She needs outpatient sleep study ?-A-fib per cardiology. ? ? ?Lactic acidosis ?Likely from poor formation in the setting of A-fib with RVR.  Blood pressure stable.  Resolved. ? ?Bandemia ?Likely demargination from steroid.  Improved after discontinuing steroid. ? ?Euthyroid sick syndrome ?Low TSH but normal free T4.  Unreliable in acute setting but does not suggest hyperthyroidism. ? ?Hyperglycemia ?Likely from steroid.  A1c 5.6%.  Improved after discontinuing the steroid. ? ?Obesity, Class III, BMI 40-49.9 (morbid obesity) ([place]) ?Body mass index is 41.71 kg/m?. ?Encourage lifestyle change to lose weight. ? ?Hypertension ?On multiple antihypertensive meds but not compliant.  Currently normotensive. ?-Cardiac meds per cardiology as above ? ?Hyponatremia, hypokalemia, hyperkalemia and hyperphosphatemia ?Resolved.  Continue monitoring. ? ? ?Anxiety with depression ?Stable.  Continue home Atarax. ? ?Alcohol use disorder ?Reports drinking about 2 of 40 ounce beers a day.  Denies withdrawal symptoms ?-Discontinued CIWA. ? ? ? ? ?DVT prophylaxis:  ?SCDs Start: 10/27/21 2123 ?On IV heparin for atrial fibrillation. ? ?Code Status:  ?Family Communication: None at bedside today. ?Level of care: Telemetry ?Status is: Inpatient ?Remains inpatient appropriate because: evaluation for iron deficiency anemia with endoscopy and colonoscopy ? ? ?Final disposition: Likely home once medically clear. ? ?Consultants:  ?PCCM ?Cardiology ?Gastroenterology ? ?Sch Meds:  ?Scheduled Meds: ? arformoterol  15 mcg Nebulization BID  ? atorvastatin  20 mg Oral QHS  ? budesonide (PULMICORT) nebulizer solution  0.5 mg Nebulization BID  ? chlorhexidine  15 mL Mouth Rinse BID  ? furosemide  20 mg Intravenous Daily  ? levalbuterol  0.63  mg Nebulization BID  ? mouth rinse  15 mL Mouth Rinse q12n4p  ? metoprolol succinate  50 mg Oral Daily  ? pantoprazole (PROTONIX) IV  40 mg Intravenous Q12H  ? polyethylene glycol-electrolytes  4,000 mL Oral Once  ? revefenacin  175 mcg Nebulization Daily  ? sacubitril-valsartan  1 tablet Oral BID  ? ?Continuous Infusions: ? sodium chloride    ? sodium chloride    ? heparin 1,650 Units/hr (11/03/21 5993)  ? ?PRN Meds:.acetaminophen **OR** acetaminophen, HYDROmorphone (DILAUDID) injection, hydrOXYzine, levalbuterol, lip balm, nitroGLYCERIN, phenol, polyethylene glycol ? ?Antimicrobials: ?Anti-infectives (From admission, onward)  ? ? Start     Dose/Rate Route Frequency Ordered Stop  ? 10/28/21 2200  azithromycin (ZITHROMAX) tablet 500 mg  Status:  Discontinued       ? 500 mg Oral Daily at bedtime 10/28/21 1416 11/01/21 0748  ? 10/27/21 2130  azithromycin (ZITHROMAX) 500 mg in sodium chloride 0.9 % 250 mL IVPB  Status:  Discontinued       ?Note to Pharmacy: (In setting of acute copd exac)  ? 500 mg ?250 mL/hr over 60 Minutes Intravenous Every 24 hours 10/27/21 2126 10/28/21 1416  ? ?  ? ? ? ?I have personally reviewed the following labs and images: ?CBC: ?  Recent Labs  ?Lab 10/27/21 ?1925 10/28/21 ?0020 10/30/21 ?0246 10/30/21 ?6219 10/31/21 ?4712 11/01/21 ?5271 11/02/21 ?2929 11/03/21 ?0903  ?WBC 13.1* 13.4*   < > 19.0* 23.5* 18.4* 11.6* 11.6*  ?NEUTROABS 8.5* 12.5*  --   --   --   --   --   --   ?HGB 10.5* 10.3*   < > 11.2* 10.7* 9.9* 9.9* 10.4*  ?HCT 32.6* 32.1*   < > 35.7* 34.1* 30.9* 31.7* 32.3*  ?MCV 83.0 82.9   < > 82.6 82.6 82.2 83.0 82.2  ?PLT 410* 325   < > 449* 364 282 264 290  ? < > = values in this interval not displayed.  ? ?BMP &GFR ?Recent Labs  ?Lab 10/28/21 ?0020 10/29/21 ?0149 10/30/21 ?0246 10/31/21 ?9692 10/31/21 ?4932 11/01/21 ?4199 11/02/21 ?1444 11/02/21 ?5848 11/03/21 ?3507  ?NA 140   < > 136 133* 133* 138  --  139 141  ?K 3.4*   < > 4.5 5.5* 5.0 4.9  --  3.8 3.8  ?CL 99   < > 101 100 101 103   --  104 101  ?CO2 28   < > '27 24 23 26  '$ --  28 34*  ?GLUCOSE 176*   < > 234* 361* 265* 176*  --  114* 106*  ?BUN 9   < > 19 30* 27* 22*  --  16 9  ?CREATININE 0.59   < > 0.69 0.98 0.87 0.74  --  0.59

## 2021-11-03 NOTE — Progress Notes (Signed)
ANTICOAGULATION CONSULT NOTE  ? ?Pharmacy Consult for apixaban >> heparin drip ?Indication: atrial fibrillation  ? ?Allergies  ?Allergen Reactions  ? Sodium Ferric Gluconate [Ferrous Gluconate] Other (See Comments)  ?  Patient had near syncope with abdominal pain and diaphoresis about an hour after IV ferric gluconate although it was while she was on commode having bowel movements  ? Tetracyclines & Related Itching  ? ? ?Patient Measurements: ?Height: '5\' 3"'$  (160 cm) ?Weight: 109.6 kg (241 lb 9.6 oz) ?IBW/kg (Calculated) : 52.4 ?Heparin Dosing Weight: 78 kg ? ?Vital Signs: ?Temp: 98.2 ?F (36.8 ?C) (04/16 1240) ?Temp Source: Oral (04/16 1240) ?BP: 108/75 (04/16 1240) ?Pulse Rate: 64 (04/16 1240) ? ?Labs: ?Recent Labs  ?  11/01/21 ?0254 11/01/21 ?0639 11/01/21 ?0639 11/02/21 ?0357 11/02/21 ?0749 11/02/21 ?1251 11/03/21 ?0338 11/03/21 ?1411  ?HGB  --  9.9*   < > 9.9*  --   --  10.4*  --   ?HCT  --  30.9*  --  31.7*  --   --  32.3*  --   ?PLT  --  282  --  264  --   --  290  --   ?APTT  --   --   --   --   --  24 56* 88*  ?HEPARINUNFRC  --  0.66  --   --   --  >1.10* >1.10*  --   ?CREATININE 0.74  --   --   --  0.59  --  0.57  --   ? < > = values in this interval not displayed.  ? ? ? ?Estimated Creatinine Clearance: 88.9 mL/min (by C-G formula based on SCr of 0.57 mg/dL). ? ? ?Assessment: ?Patient is a 61 y.o F with hx COPD, PAF and HF presented to the ED on 10/27/21 with c/o SOB.  She went into afib with RVR on 10/29/21 and underwent TEE/DCCV on 10/31/21.  She was transitioned to Eliquis on 4/14.  Pharmacy has been consulted to transition Eliquis back to heparin drip in anticipation for endoscopy and colonoscopy on Monday, 4/17.  Last dose of Eliquis 4/15 '@1009'$  ? ?Today, 11/03/2021: ?- APTT  88 sec- therapeutic on IV heparin 1650 units/hr ?- Heparin level >1.1 - elevated as anticipated due to recent Eliquis doses.  Will monitor heparin therapy using aptt levels until these 2 levels correlate ?- CBC: Hg 10.4- low but  slightly improved from previous days, pltc WNL ?- No bleeding or infusion related concerns reported by nursing ? ?Goal of Therapy:  ?Heparin level 0.3-0.7 ?APTT 66-102s ?Monitor platelets by anticoagulation protocol: Yes ?  ?Plan:  ?- Continue heparin infusion at 1650 units/hr  ?- Check aPTT in 6 hours to confirm therapeutic ?- Daily CBC & heparin level ?- F/u resume Eliquis after procedures on 4/17 ? ?HOLD heparin infusion on 4/17 @ 0700 per GI - plan for procedure ~1300  ? ?Peggyann Juba, PharmD, BCPS ?Pharmacy: [phone] ?11/03/2021 3:08 PM ? ?

## 2021-11-04 ENCOUNTER — Encounter (HOSPITAL_COMMUNITY): Payer: Self-pay | Admitting: Internal Medicine

## 2021-11-04 ENCOUNTER — Inpatient Hospital Stay (HOSPITAL_COMMUNITY): Payer: Medicare Other | Admitting: Anesthesiology

## 2021-11-04 ENCOUNTER — Encounter (HOSPITAL_COMMUNITY)
Admission: EM | Disposition: A | Discharge status: Home / Self Care | Payer: Self-pay | Source: Home / Self Care | Attending: Student

## 2021-11-04 DIAGNOSIS — B3781 Candidal esophagitis: Secondary | ICD-10-CM

## 2021-11-04 DIAGNOSIS — Z800 Family history of malignant neoplasm of digestive organs: Secondary | ICD-10-CM

## 2021-11-04 DIAGNOSIS — K229 Disease of esophagus, unspecified: Secondary | ICD-10-CM

## 2021-11-04 DIAGNOSIS — K3189 Other diseases of stomach and duodenum: Secondary | ICD-10-CM

## 2021-11-04 DIAGNOSIS — K449 Diaphragmatic hernia without obstruction or gangrene: Secondary | ICD-10-CM

## 2021-11-04 HISTORY — PX: ESOPHAGOGASTRODUODENOSCOPY (EGD) WITH PROPOFOL: SHX5813

## 2021-11-04 HISTORY — PX: POLYPECTOMY: SHX5525

## 2021-11-04 HISTORY — PX: COLONOSCOPY WITH PROPOFOL: SHX5780

## 2021-11-04 HISTORY — PX: BIOPSY: SHX5522

## 2021-11-04 LAB — CBC
HCT: 32.3 % — ABNORMAL LOW (ref 36.0–46.0)
Hemoglobin: 10 g/dL — ABNORMAL LOW (ref 12.0–15.0)
MCH: 25.9 pg — ABNORMAL LOW (ref 26.0–34.0)
MCHC: 31 g/dL (ref 30.0–36.0)
MCV: 83.7 fL (ref 80.0–100.0)
Platelets: 278 10*3/uL (ref 150–400)
RBC: 3.86 MIL/uL — ABNORMAL LOW (ref 3.87–5.11)
RDW: 19.7 % — ABNORMAL HIGH (ref 11.5–15.5)
WBC: 11.9 10*3/uL — ABNORMAL HIGH (ref 4.0–10.5)
nRBC: 0 % (ref 0.0–0.2)

## 2021-11-04 LAB — RENAL FUNCTION PANEL
Albumin: 3.2 g/dL — ABNORMAL LOW (ref 3.5–5.0)
Anion gap: 9 (ref 5–15)
BUN: 6 mg/dL (ref 6–20)
CO2: 30 mmol/L (ref 22–32)
Calcium: 8.6 mg/dL — ABNORMAL LOW (ref 8.9–10.3)
Chloride: 104 mmol/L (ref 98–111)
Creatinine, Ser: 0.58 mg/dL (ref 0.44–1.00)
GFR, Estimated: >60 mL/min (ref >60)
Glucose, Bld: 123 mg/dL — ABNORMAL HIGH (ref 70–99)
Phosphorus: 4.4 mg/dL (ref 2.5–4.6)
Potassium: 4.1 mmol/L (ref 3.5–5.1)
Sodium: 143 mmol/L (ref 135–145)

## 2021-11-04 LAB — APTT: aPTT: 94 seconds — ABNORMAL HIGH (ref 24–36)

## 2021-11-04 LAB — BRAIN NATRIURETIC PEPTIDE: B Natriuretic Peptide: 37.2 pg/mL (ref 0.0–100.0)

## 2021-11-04 LAB — HEPARIN LEVEL (UNFRACTIONATED): Heparin Unfractionated: 0.63 IU/mL (ref 0.30–0.70)

## 2021-11-04 LAB — MAGNESIUM: Magnesium: 1.8 mg/dL (ref 1.7–2.4)

## 2021-11-04 SURGERY — ESOPHAGOGASTRODUODENOSCOPY (EGD) WITH PROPOFOL
Anesthesia: Monitor Anesthesia Care

## 2021-11-04 MED ORDER — PROPOFOL 1000 MG/100ML IV EMUL
INTRAVENOUS | Status: AC
  Filled 2021-11-04: qty 100

## 2021-11-04 MED ORDER — PROPOFOL 500 MG/50ML IV EMUL
INTRAVENOUS | Status: DC | PRN
  Administered 2021-11-04: 145 ug/kg/min via INTRAVENOUS

## 2021-11-04 MED ORDER — LACTATED RINGERS IV SOLN: INTRAVENOUS | Status: DC

## 2021-11-04 MED ORDER — MAGNESIUM SULFATE IN D5W 1-5 GM/100ML-% IV SOLN
1.0000 g | Freq: Once | INTRAVENOUS | Status: AC
  Administered 2021-11-04: 1 g via INTRAVENOUS
  Filled 2021-11-04: qty 100

## 2021-11-04 MED ORDER — FLUCONAZOLE IN SODIUM CHLORIDE 400-0.9 MG/200ML-% IV SOLN
400.0000 mg | Freq: Once | INTRAVENOUS | Status: AC
  Administered 2021-11-04: 400 mg via INTRAVENOUS
  Filled 2021-11-04: qty 200

## 2021-11-04 MED ORDER — ALUM & MAG HYDROXIDE-SIMETH 200-200-20 MG/5ML PO SUSP
30.0000 mL | ORAL | Status: DC | PRN
  Administered 2021-11-04: 30 mL via ORAL
  Filled 2021-11-04: qty 30

## 2021-11-04 MED ORDER — APIXABAN 5 MG PO TABS
5.0000 mg | ORAL_TABLET | Freq: Two times a day (BID) | ORAL | Status: DC
Start: 2021-11-04 — End: 2021-11-05
  Administered 2021-11-04 – 2021-11-05 (×2): 5 mg via ORAL
  Filled 2021-11-04 (×2): qty 1

## 2021-11-04 MED ORDER — PROPOFOL 500 MG/50ML IV EMUL
INTRAVENOUS | Status: DC | PRN
  Administered 2021-11-04: 40 mg via INTRAVENOUS

## 2021-11-04 MED ORDER — PANTOPRAZOLE SODIUM 40 MG IV SOLR
40.0000 mg | INTRAVENOUS | Status: DC
  Administered 2021-11-05: 40 mg via INTRAVENOUS
  Filled 2021-11-04: qty 10

## 2021-11-04 MED ORDER — ONDANSETRON HCL 4 MG/2ML IJ SOLN
INTRAMUSCULAR | Status: DC | PRN
Start: 2021-11-04 — End: 2021-11-04
  Administered 2021-11-04: 4 mg via INTRAVENOUS

## 2021-11-04 MED ORDER — FLUCONAZOLE 100 MG PO TABS
200.0000 mg | ORAL_TABLET | Freq: Every day | ORAL | Status: DC
  Administered 2021-11-05: 200 mg via ORAL
  Filled 2021-11-04: qty 2

## 2021-11-04 SURGICAL SUPPLY — 25 items

## 2021-11-04 NOTE — Progress Notes (Signed)
ANTICOAGULATION CONSULT NOTE  ? ?Pharmacy Consult for apixaban >> heparin drip ?Indication: atrial fibrillation  ? ?Allergies  ?Allergen Reactions  ? Sodium Ferric Gluconate [Ferrous Gluconate] Other (See Comments)  ?  Patient had near syncope with abdominal pain and diaphoresis about an hour after IV ferric gluconate although it was while she was on commode having bowel movements  ? Tetracyclines & Related Itching  ? ? ?Patient Measurements: ?Height: '5\' 3"'$  (160 cm) ?Weight: 109.6 kg (241 lb 9.6 oz) ?IBW/kg (Calculated) : 52.4 ?Heparin Dosing Weight: 78 kg ? ?Vital Signs: ?Temp: 98.2 ?F (36.8 ?C) (04/17 0349) ?Temp Source: Oral (04/17 0349) ?BP: 105/67 (04/17 0349) ?Pulse Rate: 73 (04/17 0349) ? ?Labs: ?Recent Labs  ?  11/02/21 ?0357 11/02/21 ?0357 11/02/21 ?0749 11/02/21 ?1251 11/03/21 ?0338 11/03/21 ?1411 11/03/21 ?2007 11/04/21 ?0336  ?HGB 9.9*  --   --   --  10.4*  --   --  10.0*  ?HCT 31.7*  --   --   --  32.3*  --   --  32.3*  ?PLT 264  --   --   --  290  --   --  278  ?APTT  --    < >  --  24 56* 88* 97* 94*  ?HEPARINUNFRC  --   --   --  >1.10* >1.10*  --   --  0.63  ?CREATININE  --   --  0.59  --  0.57  --   --  0.58  ? < > = values in this interval not displayed.  ? ? ? ?Estimated Creatinine Clearance: 88.9 mL/min (by C-G formula based on SCr of 0.58 mg/dL). ? ? ?Assessment: ?Patient is a 61 y.o F with hx COPD, PAF and HF presented to the ED on 10/27/21 with c/o SOB.  She went into afib with RVR on 10/29/21 and underwent TEE/DCCV on 10/31/21.  She was transitioned to Eliquis on 4/14.  Pharmacy has been consulted to transition Eliquis back to heparin drip in anticipation for endoscopy and colonoscopy on Monday, 4/17.  Last dose of Eliquis 4/15 '@1009'$  ? ?Today, 11/04/2021: ?- APTT  94 sec- therapeutic on IV heparin 1650 units/hr ?- Heparin level 0.63, therapeutic.  Heparin levels and aPTT  are now correlating  ?- CBC: Hg 10.0- low but slightly improved from previous days, pltc WNL ?- No bleeding or infusion related  concerns reported by nursing ? ?Goal of Therapy:  ?Heparin level 0.3-0.7 ?APTT 66-102s ?Monitor platelets by anticoagulation protocol: Yes ?  ?Plan:  ?- Confirmed  heparin infusion was stopped this AM  ?- Daily CBC, heparin level, and aPTT ?- F/u resume Eliquis after procedures on 4/17 ? ?HOLD heparin infusion on 4/17 @ 0700 per GI - plan for procedure ~1300  ? ? ?Royetta Asal, PharmD, BCPS ?11/04/2021 7:56 AM ? ? ?

## 2021-11-04 NOTE — Progress Notes (Signed)
?PROGRESS NOTE ? ?[name] [name] TKW:[id number] DOB: 04/03/1961  ? ?PCP: Kerin Perna, NP ? ?Patient is from: Home ? ?DOA: 10/27/2021 LOS: 8 ? ?Chief complaints ?Chief Complaint  ?Patient presents with  ? Shortness of Breath  ?  ? ?Brief Narrative / Interim history: ?61 year old F with PMH of COPD, chronic hypoxic RF on 2 L, A-fib/PVCs, diastolic CHF, alcohol abuse, anxiety, chronic pain, anemia, gastric ulcer morbid obesity presenting with progressive shortness of breath and admitted for acute on chronic hypoxic respiratory failure due to COPD exacerbation and A-fib with RVR.  Initially required BiPAP for hypoxic respiratory failure and work of breathing.  Cardiology and pulmonology consulted.   ? ?Respiratory symptoms improved.  Pulmonology signed off.   ? ?Patient had successful TEE cardioversion for A-fib with RVR on 10/31/2021.  TTE with LVEF of 25 to 30% with global and regional hypokinesis.  TTE ordered.  Cardiology following. ? ?GI consulted for iron deficiency anemia.  However, Hemoccult was negative.  H&H remained stable.  [facility] consulted. ? ?EGD on 4/17 with esophageal plaques (biopsied), 2 cm hiatal hernia and erythematous mucosa in the antrum (biopsied) and normal duodenum (biopsied).  Colonoscopy with normal examined ileum, colon polyps and nonbleeding internal hemorrhoid. ? ? ?  ? ?Subjective: ?Seen and examined earlier this morning before she went for EGD and colonoscopy..  No major events overnight of this morning.  She has a sleep last night from bowel prep.  Feels tired today.  No other complaints.  She denies chest pain, dyspnea, palpitation or GI bleed. ? ?Objective: ?Vitals:  ? 11/04/21 1201 11/04/21 1400 11/04/21 1410 11/04/21 1420  ?BP: 138/77 124/74 129/74 135/77  ?Pulse: 85 99 93 89  ?Resp: 16 (!) 25 (!) 27 16  ?Temp: (!) 97.5 ?F (36.4 ?C) 98.5 ?F (36.9 ?C)    ?TempSrc: Temporal Temporal    ?SpO2: 96% 99% 91% 94%  ?Weight: 109.6 kg     ?Height: '5\' 3"'$  (1.6 m)      ? ? ?Examination: ? ?GENERAL: No apparent distress.  Nontoxic. ?HEENT: MMM.  Vision and hearing grossly intact.  ?NECK: Supple.  No apparent JVD.  ?RESP:  No IWOB.  Fair aeration bilaterally. ?CVS:  RRR. Heart sounds normal.  ?ABD/GI/GU: BS+. Abd soft, NTND.  ?MSK/EXT:  Moves extremities. No apparent deformity. No edema.  ?SKIN: no apparent skin lesion or wound ?NEURO: Awake and alert. Oriented appropriately.  No apparent focal neuro deficit. ?PSYCH: Calm. Normal affect.  ? ?Procedures:  ?4/13-successful TEE cardioversion ? ?Microbiology summarized: ?COVID-19 and influenza PCR nonreactive. ?MRSA PCR screen negative. ? ?Assessment and Plan: ?* COPD with acute exacerbation ([place]) ?Seems to have resolved.  See respiratory failure for detail ? ? ?Iron deficiency anemia ?Recent Labs  ?  05/16/21 ?0657 08/22/21 ?1143 10/27/21 ?1925 10/28/21 ?0020 10/30/21 ?0246 10/30/21 ?9242 10/31/21 ?6834 11/01/21 ?1962 11/02/21 ?2297 11/03/21 ?9892  ?HGB 10.6* 13.1 10.5* 10.3* 9.0* 11.2* 10.7* 9.9* 9.9* 10.4*  ?H&H relatively stable.  Hemoccult negative.  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  Constantly uses ibuprofen.  Anemia panel with iron deficiency.  EGD and colonoscopy as above. ?-Continue PPI-changed to daily ?-Had what looks like vasovagal episode while on commode about an hour after IV iron.  ?-Advised against NSAIDs. ?-Monitor intermittently. ? ? ? ?Acute on chronic combined CHF ?TEE with LVEF of 25 to 30% with global and regional hypokinesis.  TTE with LVEF of 50 to 55% and G3-DD.  Grossly euvolemic.  BNP in the low 100s.  Seems to  be on p.o. Lasix at home.  Started on IV Lasix.  About 1 L UOP but missed voids while on bowel prep.  Still net +3.2 L. ?-Cardiology managing-started IV Lasix, Entresto, Lipitor and Toprol-XL ?-Monitor fluid and respiratory status ?-Monitor renal functions and electrolytes ? ?Paroxysmal atrial fibrillation (HCC) ?S/p successful TEE cardioversion on 10/31/2021.  TEE with LVEF of 25 to 30% with global and regional  hypokinesis.  TTE with LVEF of 50 to 55% and G3-DD.  TSH low but free T4 within normal. ?-Cardiology managing ?-On Toprol-XL 50 mg daily per cardiology. ?-Anticoagulation resumed with p.o. Eliquis. ?-Optimize electrolytes ?-She needs sleep study and CPAP ? ? ?Acute on chronic respiratory failure with hypoxia (HCC) ?In the setting of COPD exacerbation, A-fib with RVR and undiagnosed OSA.  She reports using 2 L at night.  She says she has not had a sleep study.  Respiratory failure resolved. ?-Encourage trying CPAP and instead of oxygen at night ?-She needs outpatient sleep study ?-A-fib per cardiology. ? ? ?Esophageal candidiasis ([place]) ?Asymptomatic.  IV Diflucan 400 mg x 1 followed by p.o. Diflucan 200 mg daily for a total of 2 weeks ?Follow-up biopsy from EGD ? ?Lactic acidosis ?Likely from poor formation in the setting of A-fib with RVR.  Blood pressure stable.  Resolved. ? ?Bandemia ?Likely demargination from steroid.  Improved after discontinuing steroid. ? ?Euthyroid sick syndrome ?Low TSH but normal free T4.  Unreliable in acute setting but does not suggest hyperthyroidism. ? ?Hyperglycemia ?Likely from steroid.  A1c 5.6%.  Improved after discontinuing the steroid. ? ?Obesity, Class III, BMI 40-49.9 (morbid obesity) ([place]) ?Body mass index is 41.71 kg/m?. ?Encourage lifestyle change to lose weight. ? ?Hypertension ?On multiple antihypertensive meds but not compliant.  Currently normotensive. ?-Cardiac meds per cardiology as above ? ?Hyponatremia, hypokalemia, hyperkalemia and hyperphosphatemia ?Resolved.  Continue monitoring. ? ? ?Anxiety with depression ?Stable.  Continue home Atarax. ? ?Alcohol use disorder ?Reports drinking about 2 of 40 ounce beers a day.  Denies withdrawal symptoms ?-Discontinued CIWA. ? ? ? ? ?DVT prophylaxis:  ?SCDs Start: 10/27/21 2123 ?apixaban (ELIQUIS) tablet 5 mg  ? ?Code Status:  ?Family Communication: None at bedside today. ?Level of care: Telemetry ?Status is: Inpatient ?Remains  inpatient appropriate because: A-fib and iron deficiency anemia ? ? ?Final disposition: Likely home on 4/18. ? ?Consultants:  ?PCCM-signed off ?Cardiology ?Gastroenterology-signed off ? ?Sch Meds:  ?Scheduled Meds: ? apixaban  5 mg Oral BID  ? arformoterol  15 mcg Nebulization BID  ? atorvastatin  20 mg Oral QHS  ? budesonide (PULMICORT) nebulizer solution  0.5 mg Nebulization BID  ? chlorhexidine  15 mL Mouth Rinse BID  ? [START ON 11/05/2021] fluconazole  200 mg Oral Daily  ? furosemide  20 mg Intravenous Daily  ? mouth rinse  15 mL Mouth Rinse q12n4p  ? metoprolol succinate  50 mg Oral Daily  ? [START ON 11/05/2021] pantoprazole (PROTONIX) IV  40 mg Intravenous Q24H  ? revefenacin  175 mcg Nebulization Daily  ? sacubitril-valsartan  1 tablet Oral BID  ? ?Continuous Infusions: ? fluconazole (DIFLUCAN) IV    ? magnesium sulfate bolus IVPB    ? ?PRN Meds:.acetaminophen **OR** acetaminophen, HYDROmorphone (DILAUDID) injection, hydrOXYzine, levalbuterol, lip balm, nitroGLYCERIN, phenol, polyethylene glycol ? ?Antimicrobials: ?Anti-infectives (From admission, onward)  ? ? Start     Dose/Rate Route Frequency Ordered Stop  ? 11/05/21 1000  fluconazole (DIFLUCAN) tablet 200 mg       ?See Hyperspace for full Linked Orders Report.  ?  200 mg Oral Daily 11/04/21 1647 11/18/21 0959  ? 11/04/21 1745  fluconazole (DIFLUCAN) IVPB 400 mg       ?See Hyperspace for full Linked Orders Report.  ? 400 mg ?100 mL/hr over 120 Minutes Intravenous  Once 11/04/21 1647    ? 10/28/21 2200  azithromycin (ZITHROMAX) tablet 500 mg  Status:  Discontinued       ? 500 mg Oral Daily at bedtime 10/28/21 1416 11/01/21 0748  ? 10/27/21 2130  azithromycin (ZITHROMAX) 500 mg in sodium chloride 0.9 % 250 mL IVPB  Status:  Discontinued       ?Note to Pharmacy: (In setting of acute copd exac)  ? 500 mg ?250 mL/hr over 60 Minutes Intravenous Every 24 hours 10/27/21 2126 10/28/21 1416  ? ?  ? ? ? ?I have personally reviewed the following labs and  images: ?CBC: ?Recent Labs  ?Lab 10/31/21 ?0251 11/01/21 ?1610 11/02/21 ?9604 11/03/21 ?5409 11/04/21 ?8119  ?WBC 23.5* 18.4* 11.6* 11.6* 11.9*  ?HGB 10.7* 9.9* 9.9* 10.4* 10.0*  ?HCT 34.1* 30.9* 31.7* 32.3* 32.3*  ?MCV 82.6 82.2 83.0 82.

## 2021-11-04 NOTE — Op Note (Signed)
[facility] ?Patient Name: [name] [name] ?Procedure Date: 11/04/2021 ?MRN: [id number] ?Attending MD: Ronnette Juniper , MD ?Date of Birth: 28-Dec-1960 ?CSN: [id number] ?Age: 61 ?Admit Type: Inpatient ?Procedure:                Upper GI endoscopy ?Indications:              Unexplained iron deficiency anemia ?Providers:                Ronnette Juniper, MD, Jaci Carrel, RN, Janie Billups,  ?                          Technician, [employer], CRNA ?Referring MD:             [facility] ?Medicines:                Monitored Anesthesia Care ?Complications:            No immediate complications. Estimated blood loss:  ?                          Minimal. ?Estimated Blood Loss:     Estimated blood loss was minimal. ?Procedure:                Pre-Anesthesia Assessment: ?                          - Prior to the procedure, a History and Physical  ?                          was performed, and patient medications and  ?                          allergies were reviewed. The patient's tolerance of  ?                          previous anesthesia was also reviewed. The risks  ?                          and benefits of the procedure and the sedation  ?                          options and risks were discussed with the patient.  ?                          All questions were answered, and informed consent  ?                          was obtained. Prior Anticoagulants: The patient has  ?                          taken no previous anticoagulant or antiplatelet  ?                          agents. ASA Grade Assessment: III - A patient with  ?  severe systemic disease. After reviewing the risks  ?                          and benefits, the patient was deemed in  ?                          satisfactory condition to undergo the procedure. ?                          After obtaining informed consent, the endoscope was  ?                          passed under direct vision. Throughout the  ?                           procedure, the patient's blood pressure, pulse, and  ?                          oxygen saturations were monitored continuously. The  ?                          [id number]-[id number] ([id number]) Olympus endoscope was introduced  ?                          through the mouth, and advanced to the second part  ?                          of duodenum. The upper GI endoscopy was  ?                          accomplished without difficulty. The patient  ?                          tolerated the procedure well. ?Scope In: ?Scope Out: ?Findings: ?     Patchy, white plaques were found in the entire esophagus. Biopsies were  ?     taken with a cold forceps for histology. ?     A 2 cm hiatal hernia was present. ?     Localized moderately erythematous mucosa without bleeding was found in  ?     the gastric antrum. Biopsies were taken with a cold forceps for  ?     Helicobacter pylori testing. ?     The cardia and gastric fundus were normal on retroflexion. ?     The examined duodenum was normal. Biopsies for histology were taken with  ?     a cold forceps for evaluation of celiac disease. ?Impression:               - Esophageal plaques were found, suspicious for  ?                          candidiasis. Biopsied. ?                          - 2 cm hiatal hernia. ?                          -  Erythematous mucosa in the antrum. Biopsied. ?                          - Normal examined duodenum. Biopsied. ?Moderate Sedation: ?     Patient did not receive moderate sedation for this procedure, but  ?     instead received monitored anesthesia care. ?Recommendation:           - Resume regular diet. ?                          - Continue present medications. ?                          - Await pathology results. ?Procedure Code(s):        --- Professional --- ?                          [phone], Esophagogastroduodenoscopy, flexible,  ?                          transoral; with biopsy, single or multiple ?Diagnosis Code(s):        --- Professional --- ?                           K22.9, Disease of esophagus, unspecified ?                          K44.9, Diaphragmatic hernia without obstruction or  ?                          gangrene ?                          K31.89, Other diseases of stomach and duodenum ?                          D50.9, Iron deficiency anemia, unspecified ?CPT copyright 2019 [employer]. All rights reserved. ?The codes documented in this report are preliminary and upon coder review may  ?be revised to meet current compliance requirements. ?Ronnette Juniper, MD ?11/04/2021 1:54:37 PM ?This report has been signed electronically. ?Number of Addenda: 0 ?

## 2021-11-04 NOTE — Interval H&P Note (Signed)
History and Physical Interval Note: ?60/female with anemia, family history of colon cancer in her mother in his 79s, last colonoscopy was in 2002, for an EGD and colonoscopy today. ? ?11/04/2021 ?1:17 PM ? ?DESTINA MANTEI  has presented today for EGD and colonoscopy, with the diagnosis of Anemia.  The various methods of treatment have been discussed with the patient and family. After consideration of risks, benefits and other options for treatment, the patient has consented to  Procedure(s): ?ESOPHAGOGASTRODUODENOSCOPY (EGD) WITH PROPOFOL (N/A) ?COLONOSCOPY WITH PROPOFOL (N/A) as a surgical intervention.  The patient's history has been reviewed, patient examined, no change in status, stable for surgery.  I have reviewed the patient's chart and labs.  Questions were answered to the patient's satisfaction.   ? ? ?Ronnette Juniper ? ? ?

## 2021-11-04 NOTE — Assessment & Plan Note (Addendum)
Asymptomatic.  IV Diflucan 400 mg x 1 followed by p.o. Diflucan 200 mg daily for a total of 2 weeks ?Follow-up biopsy from EGD ?

## 2021-11-04 NOTE — Anesthesia Preprocedure Evaluation (Addendum)
Anesthesia Evaluation  ?Patient identified by MRN, date of birth, ID band ?Patient awake ? ? ? ?Reviewed: ?Allergy & Precautions, NPO status , Patient's Chart, lab work & pertinent test results ? ?Airway ?Mallampati: II ? ?TM Distance: >3 FB ?Neck ROM: Full ? ? ? Dental ? ?(+) Dental Advisory Given, Teeth Intact ?  ?Pulmonary ?shortness of breath and with exertion, asthma , COPD (oxygen at home 2LPM [place] ),  COPD inhaler and oxygen dependent,  ?Likely OSA, has not had sleep study yet ?Has been on oxygen at home for about 1 year now  ?  ?Pulmonary exam normal ?breath sounds clear to auscultation ? ? ? ? ? ? Cardiovascular ?hypertension, Pt. on medications ?pulmonary hypertension (mild pHTN)+CHF (grade 2 diastolic dysfunction)  ?Normal cardiovascular exam+ dysrhythmias (afib with RVR, rate in the 140s) Atrial Fibrillation + Valvular Problems/Murmurs AI and MR  ?Rhythm:Regular Rate:Normal ? ?Echo (TTE) 11/01/2021 ?1. Left ventricular ejection fraction, by estimation, is 50 to 55%. The left ventricle has low normal function. The left ventricle has no regional wall motion abnormalities. There is mild left ventricular hypertrophy. Left ventricular diastolic parameters are consistent with Grade III diastolic dysfunction (restrictive). Elevated left atrial pressure.  ??2. Right ventricular systolic function is normal. The right ventricular size is normal. There is mildly elevated pulmonary artery systolic pressure.  ??3. Left atrial size was mildly dilated.  ??4. The mitral valve is grossly normal. Mild mitral valve regurgitation. No evidence of mitral stenosis.  ??5. The aortic valve is grossly normal. Aortic valve regurgitation is mild. No aortic stenosis is present.  ??6. The inferior vena cava is normal in size with greater than 50% respiratory variability, suggesting right atrial pressure of 3 mmHg.  ? ? ?Echo (TEE with cardioversion) 10/31/2021 ??1. Left ventricular ejection fraction, by  estimation, is 25 to 30%. The left ventricle has severely decreased function. The left ventricle demonstrates regional wall motion abnormalities (see scoring diagram/findings for description). The left ventricular internal cavity size was mildly dilated. There is mild left ventricular hypertrophy.  ??2. Right ventricular systolic function is low normal. The right ventricular size is normal.  ??3. Left atrial size was mildly dilated. No left atrial/left atrial  ?appendage thrombus was detected. The LAA emptying velocity was 39 cm/s.  ??4. The mitral valve is grossly normal. Mild to moderate mitral valve regurgitation. No evidence of mitral stenosis.  ??5. Tricuspid valve regurgitation is mild to moderate.  ??6. The aortic valve is normal in structure. Aortic valve regurgitation is trivial. No aortic stenosis is present.  ??7. There is mild (Grade II) plaque involving the descending aorta and ascending aorta.  ??8. Agitated saline contrast bubble study was negative, with no evidence of any interatrial shunt.  ??9. Rhythm strip during this exam demostrated Afib with RVR.  ? ? ?Echo 02/2021 ??1. Left ventricular ejection fraction, by estimation, is 60 to 65%. The left ventricle has normal function. The left ventricle has no regional wall motion abnormalities. There is moderate concentric left ventricular hypertrophy. Left ventricular diastolic parameters are consistent with Grade II diastolic dysfunction (pseudonormalization). Elevated left atrial pressure.  ??2. Right ventricular systolic function is normal. The right ventricular size is normal. There is mildly elevated pulmonary artery systolic pressure.  ??3. Left atrial size was moderately dilated.  ??4. The mitral valve is normal in structure. Mild mitral valve  ?regurgitation. No evidence of mitral stenosis.  ??5. The aortic valve is tricuspid. Aortic valve regurgitation is mild. No aortic stenosis is present.  ??6. The inferior  vena cava is dilated in size with  <50% respiratory variability, suggesting right atrial pressure of 15 mmHg.  ?  ?Neuro/Psych ?PSYCHIATRIC DISORDERS Anxiety Depression negative neurological ROS ?   ? GI/Hepatic ?PUD, (+)  ?  ? substance abuse ? alcohol use, 1 forty ounce per day, never had withdrawal  ?  ?Endo/Other  ?Morbid obesityBMI 43 ? Renal/GU ?negative Renal ROS  ?negative genitourinary ?  ?Musculoskeletal ? ?(+) Arthritis , Osteoarthritis,   ? Abdominal ?(+) + obese,   ?Peds ?negative pediatric ROS ?(+)  Hematology ? ?(+) Blood dyscrasia, anemia ,   ?Anesthesia Other Findings ? ? Reproductive/Obstetrics ?negative OB ROS ? ?  ? ? ? ? ? ? ? ? ? ? ? ? ? ?  ?  ? ? ? ? ? ?Anesthesia Physical ? ?Anesthesia Plan ? ?ASA: 4 ? ?Anesthesia Plan: MAC  ? ?Post-op Pain Management: Minimal or no pain anticipated  ? ?Induction:  ? ?PONV Risk Score and Plan: 2 and Propofol infusion and TIVA ? ?Airway Management Planned: Natural Airway and Simple Face Mask ? ?Additional Equipment: None ? ?Intra-op Plan:  ? ?Post-operative Plan:  ? ?Informed Consent: I have reviewed the patients History and Physical, chart, labs and discussed the procedure including the risks, benefits and alternatives for the proposed anesthesia with the patient or authorized representative who has indicated his/her understanding and acceptance.  ? ? ? ?Dental advisory given ? ?Plan Discussed with: CRNA ? ?Anesthesia Plan Comments:   ? ? ? ? ? ?Anesthesia Quick Evaluation ? ?

## 2021-11-04 NOTE — Op Note (Signed)
[facility] ?Patient Name: [name] [name] ?Procedure Date: 11/04/2021 ?MRN: [id number] ?Attending MD: Ronnette Juniper , MD ?Date of Birth: Jan 19, 1961 ?CSN: [id number] ?Age: 61 ?Admit Type: Inpatient ?Procedure:                Colonoscopy ?Indications:              Last colonoscopy: 2002, Unexplained iron deficiency  ?                          anemia, Family history of colon cancer in a  ?                          first-degree relative before age 54 years ?Providers:                Ronnette Juniper, MD, Jaci Carrel, RN, Janie Billups,  ?                          Technician, [employer], CRNA ?Referring MD:             [facility] ?Medicines:                Monitored Anesthesia Care ?Complications:            No immediate complications. Estimated blood loss:  ?                          Minimal. ?Estimated Blood Loss:     Estimated blood loss was minimal. ?Procedure:                Pre-Anesthesia Assessment: ?                          - Prior to the procedure, a History and Physical  ?                          was performed, and patient medications and  ?                          allergies were reviewed. The patient's tolerance of  ?                          previous anesthesia was also reviewed. The risks  ?                          and benefits of the procedure and the sedation  ?                          options and risks were discussed with the patient.  ?                          All questions were answered, and informed consent  ?                          was obtained. Prior Anticoagulants: The patient has  ?  taken no previous anticoagulant or antiplatelet  ?                          agents. ASA Grade Assessment: III - A patient with  ?                          severe systemic disease. After reviewing the risks  ?                          and benefits, the patient was deemed in  ?                          satisfactory condition to undergo the procedure. ?                           - Prior to the procedure, a History and Physical  ?                          was performed, and patient medications and  ?                          allergies were reviewed. The patient's tolerance of  ?                          previous anesthesia was also reviewed. The risks  ?                          and benefits of the procedure and the sedation  ?                          options and risks were discussed with the patient.  ?                          All questions were answered, and informed consent  ?                          was obtained. Prior Anticoagulants: The patient has  ?                          taken no previous anticoagulant or antiplatelet  ?                          agents. ASA Grade Assessment: III - A patient with  ?                          severe systemic disease. After reviewing the risks  ?                          and benefits, the patient was deemed in  ?                          satisfactory condition to undergo the procedure. ?  After obtaining informed consent, the colonoscope  ?                          was passed under direct vision. Throughout the  ?                          procedure, the patient's blood pressure, pulse, and  ?                          oxygen saturations were monitored continuously. The  ?                          [id number]-HQ190L ([id number]) Olympus colonoscope was  ?                          introduced through the anus and advanced to the the  ?                          terminal ileum. The colonoscopy was performed  ?                          without difficulty. The patient tolerated the  ?                          procedure well. The quality of the bowel  ?                          preparation was good. ?Scope In: 1:37:58 PM ?Scope Out: 1:50:15 PM ?Scope Withdrawal Time: 0 hours 9 minutes 26 seconds  ?Total Procedure Duration: 0 hours 12 minutes 17 seconds  ?Findings: ?     The perianal and digital rectal examinations were normal. ?     The  terminal ileum appeared normal. ?     Three sessile polyps were found in the hepatic flexure and cecum. The  ?     polyps were 2 to 3 mm in size. These polyps were removed with a cold  ?     biopsy forceps. Resection and retrieval were complete. ?     Non-bleeding internal hemorrhoids were found during retroflexion. The  ?     hemorrhoids were small. ?     The exam was otherwise without abnormality. ?Impression:               - The examined portion of the ileum was normal. ?                          - Three 2 to 3 mm polyps at the hepatic flexure and  ?                          in the cecum, removed with a cold biopsy forceps.  ?                          Resected and retrieved. ?                          - Non-bleeding internal hemorrhoids. ?                          -  The examination was otherwise normal. ?Moderate Sedation: ?     Patient did not receive moderate sedation for this procedure, but  ?     instead received monitored anesthesia care. ?Recommendation:           - Resume regular diet. ?                          - Continue present medications. ?                          - Await pathology results. ?                          - Repeat colonoscopy for surveillance based on  ?                          pathology results. ?Procedure Code(s):        --- Professional --- ?                          [phone], Colonoscopy, flexible; with biopsy, single  ?                          or multiple ?Diagnosis Code(s):        --- Professional --- ?                          L37.3, Other hemorrhoids ?                          K63.5, Polyp of colon ?                          D50.9, Iron deficiency anemia, unspecified ?                          Z80.0, Family history of malignant neoplasm of  ?                          digestive organs ?CPT copyright 2019 American Medical Association. All rights reserved. ?The codes documented in this report are preliminary and upon coder review may  ?be revised to meet current compliance  requirements. ?Ronnette Juniper, MD ?11/04/2021 1:57:01 PM ?This report has been signed electronically. ?Number of Addenda: 0 ?

## 2021-11-04 NOTE — Anesthesia Postprocedure Evaluation (Signed)
Anesthesia Post Note ? ?Patient: [name] [name] ? ?Procedure(s) Performed: ESOPHAGOGASTRODUODENOSCOPY (EGD) WITH PROPOFOL ?COLONOSCOPY WITH PROPOFOL ?BIOPSY ?POLYPECTOMY ? ?  ? ?Patient location during evaluation: Endoscopy ?Anesthesia Type: MAC ?Level of consciousness: awake and alert ?Pain management: pain level controlled ?Vital Signs Assessment: post-procedure vital signs reviewed and stable ?Respiratory status: spontaneous breathing ?Cardiovascular status: stable ?Anesthetic complications: no ? ? ?No notable events documented. ? ?Last Vitals:  ?Vitals:  ? 11/04/21 1410 11/04/21 1420  ?BP: 129/74 135/77  ?Pulse: 93 89  ?Resp: (!) 27 16  ?Temp:    ?SpO2: 91% 94%  ?  ?Last Pain:  ?Vitals:  ? 11/04/21 1410  ?TempSrc:   ?PainSc: 0-No pain  ? ? ?  ?  ?  ?  ?  ?  ? ?Nolon Nations ? ? ? ? ?

## 2021-11-04 NOTE — Progress Notes (Signed)
Talk to Pt about trying to use bipap tonight, she seemed like she would try it but,in the end felt like she just could not tolerate it. Asked to just use 02 like she does at home. ?

## 2021-11-04 NOTE — Plan of Care (Signed)
  Problem: Clinical Measurements: Goal: Diagnostic test results will improve Outcome: Progressing Goal: Respiratory complications will improve Outcome: Progressing   Problem: Activity: Goal: Risk for activity intolerance will decrease Outcome: Progressing   Problem: Safety: Goal: Ability to remain free from injury will improve Outcome: Progressing   

## 2021-11-04 NOTE — Transfer of Care (Signed)
Immediate Anesthesia Transfer of Care Note ? ?Patient: [name] [name] ? ?Procedure(s) Performed: Procedure(s): ?ESOPHAGOGASTRODUODENOSCOPY (EGD) WITH PROPOFOL (N/A) ?COLONOSCOPY WITH PROPOFOL (N/A) ?BIOPSY ?POLYPECTOMY ? ?Patient Location: PACU ? ?Anesthesia Type:MAC ? ?Level of Consciousness:  sedated, patient cooperative and responds to stimulation ? ?Airway & Oxygen Therapy:Patient Spontanous Breathing and Patient connected to face mask oxgen ? ?Post-op Assessment:  Report given to PACU RN and Post -op Vital signs reviewed and stable ? ?Post vital signs:  Reviewed and stable ? ?Last Vitals:  ?Vitals:  ? 11/04/21 1201 11/04/21 1400  ?BP: 138/77 124/74  ?Pulse: 85 99  ?Resp: 16 (!) 25  ?Temp: (!) 36.4 ?C   ?SpO2: 96% 100%  ? ? ?Complications: No apparent anesthesia complications ? ?

## 2021-11-05 ENCOUNTER — Encounter (HOSPITAL_COMMUNITY): Payer: Self-pay | Admitting: Gastroenterology

## 2021-11-05 ENCOUNTER — Other Ambulatory Visit (HOSPITAL_COMMUNITY): Payer: Self-pay

## 2021-11-05 DIAGNOSIS — I4891 Unspecified atrial fibrillation: Secondary | ICD-10-CM

## 2021-11-05 DIAGNOSIS — D501 Sideropenic dysphagia: Secondary | ICD-10-CM

## 2021-11-05 DIAGNOSIS — J4541 Moderate persistent asthma with (acute) exacerbation: Secondary | ICD-10-CM

## 2021-11-05 LAB — RENAL FUNCTION PANEL
Albumin: 3.1 g/dL — ABNORMAL LOW (ref 3.5–5.0)
Anion gap: 7 (ref 5–15)
BUN: 7 mg/dL (ref 6–20)
CO2: 26 mmol/L (ref 22–32)
Calcium: 8.6 mg/dL — ABNORMAL LOW (ref 8.9–10.3)
Chloride: 106 mmol/L (ref 98–111)
Creatinine, Ser: 0.62 mg/dL (ref 0.44–1.00)
GFR, Estimated: >60 mL/min (ref >60)
Glucose, Bld: 114 mg/dL — ABNORMAL HIGH (ref 70–99)
Phosphorus: 4.6 mg/dL (ref 2.5–4.6)
Potassium: 4 mmol/L (ref 3.5–5.1)
Sodium: 139 mmol/L (ref 135–145)

## 2021-11-05 LAB — CBC
HCT: 30.4 % — ABNORMAL LOW (ref 36.0–46.0)
Hemoglobin: 9.5 g/dL — ABNORMAL LOW (ref 12.0–15.0)
MCH: 26.4 pg (ref 26.0–34.0)
MCHC: 31.3 g/dL (ref 30.0–36.0)
MCV: 84.4 fL (ref 80.0–100.0)
Platelets: 248 10*3/uL (ref 150–400)
RBC: 3.6 MIL/uL — ABNORMAL LOW (ref 3.87–5.11)
RDW: 20 % — ABNORMAL HIGH (ref 11.5–15.5)
WBC: 16.1 10*3/uL — ABNORMAL HIGH (ref 4.0–10.5)
nRBC: 0 % (ref 0.0–0.2)

## 2021-11-05 LAB — MAGNESIUM: Magnesium: 2.3 mg/dL (ref 1.7–2.4)

## 2021-11-05 MED ORDER — PANTOPRAZOLE SODIUM 40 MG PO TBEC
40.0000 mg | DELAYED_RELEASE_TABLET | Freq: Every day | ORAL | 0 refills | Status: DC
  Filled 2021-11-05: qty 30, 30d supply, fill #0

## 2021-11-05 MED ORDER — SACUBITRIL-VALSARTAN 24-26 MG PO TABS
1.0000 | ORAL_TABLET | Freq: Two times a day (BID) | ORAL | 1 refills | Status: DC
Start: 2021-11-05 — End: 2021-11-19
  Filled 2021-11-05: qty 10, 5d supply, fill #0
  Filled 2021-11-05: qty 50, 25d supply, fill #0

## 2021-11-05 MED ORDER — DAPAGLIFLOZIN PROPANEDIOL 10 MG PO TABS
10.0000 mg | ORAL_TABLET | Freq: Every day | ORAL | 1 refills | Status: DC
Start: 2021-11-05 — End: 2021-11-20
  Filled 2021-11-05: qty 30, 30d supply, fill #0

## 2021-11-05 MED ORDER — ATORVASTATIN CALCIUM 20 MG PO TABS
20.0000 mg | ORAL_TABLET | Freq: Every day | ORAL | 1 refills | Status: DC
Start: 2021-11-05 — End: 2021-11-20
  Filled 2021-11-05: qty 90, 90d supply, fill #0

## 2021-11-05 MED ORDER — FLUCONAZOLE 200 MG PO TABS
200.0000 mg | ORAL_TABLET | Freq: Every day | ORAL | 0 refills | Status: DC
  Filled 2021-11-05: qty 12, 12d supply, fill #0

## 2021-11-05 MED ORDER — DAPAGLIFLOZIN PROPANEDIOL 10 MG PO TABS
10.0000 mg | ORAL_TABLET | Freq: Every day | ORAL | Status: DC
  Administered 2021-11-05: 10 mg via ORAL
  Filled 2021-11-05: qty 1

## 2021-11-05 MED ORDER — METOPROLOL SUCCINATE ER 50 MG PO TB24
50.0000 mg | ORAL_TABLET | Freq: Every day | ORAL | 1 refills | Status: DC
  Filled 2021-11-05: qty 90, 90d supply, fill #0

## 2021-11-05 MED ORDER — TORSEMIDE 10 MG PO TABS
5.0000 mg | ORAL_TABLET | Freq: Every day | ORAL | 1 refills | Status: DC
  Filled 2021-11-05: qty 15, 30d supply, fill #0

## 2021-11-05 MED ORDER — ALBUTEROL SULFATE HFA 108 (90 BASE) MCG/ACT IN AERS
2.0000 | INHALATION_SPRAY | Freq: Four times a day (QID) | RESPIRATORY_TRACT | 1 refills | Status: DC | PRN
  Filled 2021-11-05: qty 8.5, 25d supply, fill #0

## 2021-11-05 MED ORDER — APIXABAN 5 MG PO TABS
5.0000 mg | ORAL_TABLET | Freq: Two times a day (BID) | ORAL | 1 refills | Status: DC
  Filled 2021-11-05: qty 60, 30d supply, fill #0

## 2021-11-05 NOTE — Discharge Summary (Signed)
? ?Physician Discharge Summary  ?[name] [name]:[id number] DOB: [birth year] DOA: 10/27/2021 ? ?PCP: Kerin Perna, NP ? ?Admit date: 10/27/2021 ?Discharge date: 11/05/2021 ?Admitted From: Home ?Disposition: Home ?Recommendations for Outpatient Follow-up:  ?Follow ups as below. ?Cardiology to arrange outpatient follow-up ?Please obtain CBC/BMP/Mag at follow up ?Please follow up on the following pending results: None ? ?Home Health: Not indicated ?Equipment/Devices: Not indicated ? ?Discharge Condition: Stable ?CODE STATUS: Full code ? Follow-up Information   ? ? [name] Mormon, MD Follow up on 11/18/2021.   ?Specialties: Cardiology, Radiology ?Why: 9:45 AM ?Contact information: ?[address] ?[address] ?[place] [address] ?[phone] ? ? ?  ?  ? ? Kerin Perna, NP. Schedule an appointment as soon as possible for a visit in 1 week(s).   ?Specialty: Internal Medicine ?Contact information: ?[address] ?[place] [address] ?[phone] ? ? ?  ?  ? ?  ?  ? ?  ? ? ?Hospital course ?61 year old F with PMH of COPD, chronic hypoxic RF on 2 L, A-fib/PVCs, diastolic CHF, alcohol abuse, anxiety, chronic pain, anemia, gastric ulcer morbid obesity presenting with progressive shortness of breath and admitted for acute on chronic hypoxic respiratory failure due to COPD exacerbation and A-fib with RVR.  Initially required BiPAP for hypoxic respiratory failure and work of breathing.  Cardiology and pulmonology consulted.   ? ?Respiratory symptoms improved.  Pulmonology signed off.   ? ?Patient had successful TEE cardioversion for A-fib with RVR on 10/31/2021.  TTE with LVEF of 25 to 30% with global and regional hypokinesis.  TTE ordered.  Cardiology following. ? ?GI consulted for iron deficiency anemia.  However, Hemoccult was negative.  H&H remained stable.  [facility] consulted. ? ?EGD on 4/17 with esophageal plaques (biopsied), 2 cm hiatal hernia and erythematous mucosa in the antrum (biopsied) and  normal duodenum (biopsied).  Colonoscopy with normal examined ileum, colon polyps and nonbleeding internal hemorrhoid. ? ?Anticoagulation resumed with p.o. Eliquis.  Cleared for discharge by cardiology for outpatient follow-up.  ? ?See individual problem list below for more on hospital course. ? ?Problems addressed during this hospitalization ?Problem  ?Copd With Acute Exacerbation (Hcc)  ?Paroxysmal Atrial Fibrillation (Hcc)  ?Acute on chronic combined CHF  ?Iron Deficiency Anemia  ?Acute On Chronic Respiratory Failure With Hypoxia (Hcc)  ?Esophageal Candidiasis (Hcc)  ?Bandemia  ?Hyponatremia  ?Obesity, Class III, Bmi 40-49.9 (Morbid Obesity) (Hcc)  ?Euthyroid Sick Syndrome  ?Hypertension  ?Anxiety With Depression  ?Alcohol use disorder  ?Hypokalemia (Resolved)  ?Hyperphosphatemia (Resolved)  ?Hyperkalemia (Resolved)  ?Lactic Acidosis (Resolved)  ?Hyperglycemia (Resolved)  ?Hyponatremia, hypokalemia, hyperkalemia and hyperphosphatemia (Resolved)  ?  ?Assessment and Plan: ?* COPD with acute exacerbation ([place]) ?Seems to have resolved.  See respiratory failure for detail ? ? ?Iron deficiency anemia ?Recent Labs  ?  10/27/21 ?1925 10/28/21 ?0020 10/30/21 ?0246 10/30/21 ?3662 10/31/21 ?9476 11/01/21 ?5465 11/02/21 ?0354 11/03/21 ?6568 11/04/21 ?1275 11/05/21 ?1700  ?HGB 10.5* 10.3* 9.0* 11.2* 10.7* 9.9* 9.9* 10.4* 10.0* 9.5*  ?H&H relatively stable.  Hemoccult negative.  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  Constantly uses ibuprofen.  Anemia panel with iron deficiency.  EGD and colonoscopy as above. ?-Had what looks like vasovagal episode while on commode about an hour after IV iron.  ?-Advised against NSAIDs. ? ? ? ?Acute on chronic combined CHF ?TEE with LVEF of 25 to 30% with global and regional hypokinesis.  TTE with LVEF of 50 to 55% and G3-DD.  Grossly euvolemic. Diuresed with IV Lasix.  BNP down from 116-34.  ?-Torsemide 5 mg daily, Toprol-XL 50, Entresto 24/26, Farxiga 10 mg, Lipitor and Eliquis per card ?-Counseled on  sodium and fluid restriction as well as daily weight ?-Counseled on alcohol cessation ?-Cardiology to arrange outpatient follow-up ? ?Paroxysmal atrial fibrillation (HCC) ?S/p successful TEE cardioversion on 10/31/2021.  TEE with LVEF of 25 to 30% with global and regional hypokinesis.  TTE with LVEF of 50 to 55% and G3-DD.  TSH low but free T4 within normal. ?-Discharged on Toprol-XL 50 mg daily and Eliquis  ?-Counseled on alcohol cessation. ? ? ?Acute on chronic respiratory failure with hypoxia (HCC) ?In the setting of COPD exacerbation, A-fib with RVR and undiagnosed OSA.  Wears 2 L by [place] at night at baseline.  Acute respiratory failure resolved. ?-She needs outpatient sleep study ?-On Toprol-XL and Eliquis for A-fib ?-Continue home Breo Ellipta and as needed albuterol ? ? ?Esophageal candidiasis ([place]) ?Asymptomatic.  IV Diflucan 400 mg x 1 followed by p.o. Diflucan 200 mg daily for a total of 2 weeks ?Follow-up biopsy from EGD ? ?Bandemia ?Likely demargination from steroid.  Improved after discontinuing steroid. ? ?Euthyroid sick syndrome ?Low TSH but normal free T4.  Unreliable in acute setting but does not suggest hyperthyroidism. ? ?Obesity, Class III, BMI 40-49.9 (morbid obesity) ([place]) ?Body mass index is 41.71 kg/m?. ?Encourage lifestyle change to lose weight. ? ?Hypertension ?On multiple antihypertensive meds but not compliant.  Currently normotensive. ?-Cardiac meds per cardiology as above ? ?Anxiety with depression ?Stable.  Continue home Atarax. ? ?Alcohol use disorder ?Reports drinking about 2 of 40 ounce beers a day.  Denies withdrawal symptoms ?-Counseled on alcohol cessation. ? ? ?Lactic acidosis-resolved as of 11/05/2021 ?Likely from poor formation in the setting of A-fib with RVR.  Blood pressure stable.  Resolved. ? ?Hyperglycemia-resolved as of 11/05/2021 ?Likely from steroid.  A1c 5.6%.  Improved after discontinuing the steroid. ? ?Hyponatremia, hypokalemia, hyperkalemia and  hyperphosphatemia-resolved as of 11/05/2021 ?Resolved.  Continue monitoring. ? ? ? ? ? ?  ?  ?  ?  ? ?  ? ?Vital signs ?Vitals:  ? 11/05/21 0725 11/05/21 0739 11/05/21 0800  ?BP:     ?Pulse:     ?Temp:     ?Resp: 14  15  ?Height:     ?Weight:     ?SpO2: 99% 99%   ?TempSrc:     ?BMI (Calculated):     ?  ? ?Discharge exam ? ?GENERAL: No apparent distress.  Nontoxic. ?HEENT: MMM.  Vision and hearing grossly intact.  ?NECK: Supple.  No apparent JVD.  ?RESP:  No IWOB.  Fair aeration bilaterally. ?CVS:  RRR. Heart sounds normal.  ?ABD/GI/GU: BS+. Abd soft, NTND.  ?MSK/EXT:  Moves extremities. No apparent deformity. No edema.  ?SKIN: no apparent skin lesion or wound ?NEURO: Awake and alert. Oriented appropriately.  No apparent focal neuro deficit. ?PSYCH: Calm. Normal affect.  ? ?Discharge Instructions ?Discharge Instructions   ? ? (HEART FAILURE PATIENTS) Call MD:  Anytime you have any of the following symptoms: 1) 3 pound weight gain in 24 hours or 5 pounds in 1 week 2) shortness of breath, with or without a dry hacking cough 3) swelling in the hands, feet or stomach 4) if you have to sleep on extra pillows at night in order to breathe.   Complete by: As directed ?  ? Call MD for:  difficulty breathing, headache or visual disturbances   Complete by: As directed ?  ? Call MD for:  extreme fatigue   Complete by: As directed ?  ? Call MD for:  persistant dizziness or light-headedness   Complete by: As directed ?  ? Diet - low sodium heart healthy   Complete by: As directed ?  ? Discharge instructions   Complete by: As directed ?  ? It has been a pleasure taking care of you! ? ?You were hospitalized due to shortness of breath in the setting of COPD, atrial fibrillation and heart failure.  Your symptoms improved with treatment.  You have been started on new medications during this hospitalization.  It is very important that you take your medications as prescribed.  Please review your new medication list and the directions on  your medications before you take them.  We strongly recommend you avoid any over-the-counter pain medication other than plain Tylenol.  ? ?In addition to taking your medications as prescribed, we also recommend you avoid alcohol

## 2021-11-05 NOTE — Plan of Care (Signed)
?  Problem: Education: ?Goal: Knowledge of General Education information will improve ?Description: Including pain rating scale, medication(s)/side effects and non-pharmacologic comfort measures ?Outcome: Progressing ?  ?Problem: Nutrition: ?Goal: Adequate nutrition will be maintained ?Outcome: Progressing ?  ?Problem: Coping: ?Goal: Level of anxiety will decrease ?Outcome: Progressing ?  ?Problem: Elimination: ?Goal: Will not experience complications related to bowel motility ?Outcome: Progressing ?Goal: Will not experience complications related to urinary retention ?Outcome: Progressing ?  ?Problem: Pain Managment: ?Goal: General experience of comfort will improve ?Outcome: Progressing ?  ?Problem: Education: ?Goal: Knowledge of disease or condition will improve ?Outcome: Progressing ?Goal: Knowledge of the prescribed therapeutic regimen will improve ?Outcome: Progressing ?Goal: Individualized Educational Video(s) ?Outcome: Progressing ?  ?

## 2021-11-05 NOTE — Progress Notes (Signed)
[facility] Progress Note ? ?[name] [name] 61 y.o. [birth year] ? ?CC:   Iron deficiency anemia ?  ? ?Subjective: ?Seen sitting in bed comfortably.  Patient eating eggs and pancakes for breakfast, tolerating well.  Denies any bowel movement since her procedures.  Notes she has having some eructation but notes continued abdominal discomfort suspects is gas pains.  ? ? ?ROS : Review of Systems  ?Gastrointestinal:  Positive for abdominal pain. Negative for blood in stool, constipation, diarrhea, heartburn, melena, nausea and vomiting.  ?Genitourinary:  Negative for dysuria and urgency.  ? ? ? ?Objective: ?Vital signs in last 24 hours: ?Vitals:  ? 11/05/21 0739 11/05/21 0800  ?BP:    ?Pulse:    ?Resp:  15  ?Temp:    ?SpO2: 99%   ? ? ?Physical Exam: ? ?General:  Alert, cooperative, no distress, appears stated age  ?Head:  Normocephalic, without obvious abnormality, atraumatic  ?Eyes:  Anicteric sclera, EOM's intact  ?Lungs:   Clear to auscultation bilaterally, respirations unlabored  ?Heart:  Regular rate and rhythm, S1, S2 normal  ?Abdomen:   Soft, non-tender, hypreactive bowel sounds active all four quadrants,  no masses,   ?Extremities: Extremities normal, atraumatic, no  edema  ?Pulses: 2+ and symmetric  ? ? ?Lab Results: ?Recent Labs  ?  11/04/21 ?0336 11/05/21 ?0329  ?NA 143 139  ?K 4.1 4.0  ?CL 104 106  ?CO2 30 26  ?GLUCOSE 123* 114*  ?BUN 6 7  ?CREATININE 0.58 0.62  ?CALCIUM 8.6* 8.6*  ?MG 1.8 2.3  ?PHOS 4.4 4.6  ? ?Recent Labs  ?  11/04/21 ?0336 11/05/21 ?0329  ?ALBUMIN 3.2* 3.1*  ? ?Recent Labs  ?  11/04/21 ?0336 11/05/21 ?0329  ?WBC 11.9* 16.1*  ?HGB 10.0* 9.5*  ?HCT 32.3* 30.4*  ?MCV 83.7 84.4  ?PLT 278 248  ? ?No results for input(s): LABPROT, INR in the last 72 hours. ? ? ? ?Assessment ?Iron deficiency anemia ?- Hgb 10.5 on admission with drop to 9.0 and report of dark stools 2 weeks ago. ?- Hgb 9.5 today 4/18 ?- MCV 84.4, iron 17, ferritin 11 ? ?- History of gastric ulcer and H. Pylori gastritis as  seen on EGD 2016 ? ?EGD 11/04/2021 ?Esophageal plaques suspicious for candidiasis, awaiting pathology ?2 cm hiatal hernia ?Erythematous mucosa in the antrum, awaiting pathology ?Normal duodenum, awaiting pathology ? ?Colonoscopy 11/04/2021 ?Normal ileum ?3 polyps in the hepatic flexure, awaiting biopsy ?Nonbleeding internal hemorrhoids ?Otherwise normal exam ?  ?Paroxysmal A. Fib ?Acute COPD exacerbation ?Chronic diastolic CHF ?Alcohol use disorder ? ? ?Plan: ?- Continue fluconazole for possible candidiasis seen on on EGD ?- recommend simethicone or gasx for control of gas outpatient.  ?- Continue supportive care ?- Continue to monitor hemoglobin and transfuse if below 7 ?- [facility] will sign off ? ?Charlott Rakes PA-C ?11/05/2021, 9:19 AM ? ?Contact #  [phone]  ?

## 2021-11-05 NOTE — Plan of Care (Signed)
?  Problem: Education: ?Goal: Knowledge of General Education information will improve ?Description: Including pain rating scale, medication(s)/side effects and non-pharmacologic comfort measures ?Outcome: Adequate for Discharge ?  ?Problem: Health Behavior/Discharge Planning: ?Goal: Ability to manage health-related needs will improve ?Outcome: Adequate for Discharge ?  ?Problem: Clinical Measurements: ?Goal: Ability to maintain clinical measurements within normal limits will improve ?Outcome: Adequate for Discharge ?Goal: Will remain free from infection ?Outcome: Adequate for Discharge ?Goal: Diagnostic test results will improve ?Outcome: Adequate for Discharge ?Goal: Respiratory complications will improve ?Outcome: Adequate for Discharge ?Goal: Cardiovascular complication will be avoided ?Outcome: Adequate for Discharge ?  ?Problem: Activity: ?Goal: Risk for activity intolerance will decrease ?Outcome: Adequate for Discharge ?  ?Problem: Nutrition: ?Goal: Adequate nutrition will be maintained ?Outcome: Adequate for Discharge ?  ?Problem: Coping: ?Goal: Level of anxiety will decrease ?Outcome: Adequate for Discharge ?  ?Problem: Elimination: ?Goal: Will not experience complications related to bowel motility ?Outcome: Adequate for Discharge ?Goal: Will not experience complications related to urinary retention ?Outcome: Adequate for Discharge ?  ?Problem: Pain Managment: ?Goal: General experience of comfort will improve ?Outcome: Adequate for Discharge ?  ?Problem: Safety: ?Goal: Ability to remain free from injury will improve ?Outcome: Adequate for Discharge ?  ?Problem: Skin Integrity: ?Goal: Risk for impaired skin integrity will decrease ?Outcome: Adequate for Discharge ?  ?Problem: Education: ?Goal: Knowledge of disease or condition will improve ?Outcome: Adequate for Discharge ?Goal: Knowledge of the prescribed therapeutic regimen will improve ?Outcome: Adequate for Discharge ?Goal: Individualized Educational  Video(s) ?Outcome: Adequate for Discharge ?  ?Problem: Activity: ?Goal: Ability to tolerate increased activity will improve ?Outcome: Adequate for Discharge ?Goal: Will verbalize the importance of balancing activity with adequate rest periods ?Outcome: Adequate for Discharge ?  ?Problem: Respiratory: ?Goal: Ability to maintain a clear airway will improve ?Outcome: Adequate for Discharge ?Goal: Levels of oxygenation will improve ?Outcome: Adequate for Discharge ?Goal: Ability to maintain adequate ventilation will improve ?Outcome: Adequate for Discharge ?  ?Problem: Education: ?Goal: Knowledge of disease or condition will improve ?Outcome: Adequate for Discharge ?Goal: Knowledge of the prescribed therapeutic regimen will improve ?Outcome: Adequate for Discharge ?Goal: Individualized Educational Video(s) ?Outcome: Adequate for Discharge ?  ?Problem: Activity: ?Goal: Ability to tolerate increased activity will improve ?Outcome: Adequate for Discharge ?Goal: Will verbalize the importance of balancing activity with adequate rest periods ?Outcome: Adequate for Discharge ?  ?Problem: Respiratory: ?Goal: Ability to maintain a clear airway will improve ?Outcome: Adequate for Discharge ?Goal: Levels of oxygenation will improve ?Outcome: Adequate for Discharge ?Goal: Ability to maintain adequate ventilation will improve ?Outcome: Adequate for Discharge ?  ?Problem: Education: ?Goal: Ability to demonstrate management of disease process will improve ?Outcome: Adequate for Discharge ?Goal: Ability to verbalize understanding of medication therapies will improve ?Outcome: Adequate for Discharge ?Goal: Individualized Educational Video(s) ?Outcome: Adequate for Discharge ?  ?Problem: Activity: ?Goal: Capacity to carry out activities will improve ?Outcome: Adequate for Discharge ?  ?Problem: Cardiac: ?Goal: Ability to achieve and maintain adequate cardiopulmonary perfusion will improve ?Outcome: Adequate for Discharge ?  ?

## 2021-11-05 NOTE — Progress Notes (Signed)
Discharge paperwork given to patient and this RN went over the details with patient. Patient states she has no questions. She verbalized that she understood there were two new heart failure medications ordered. Now waiting for [facility] pharmacy to drop off prescriptions before patient can be taken to discharge lobby. ?

## 2021-11-06 ENCOUNTER — Telehealth: Payer: Self-pay

## 2021-11-06 LAB — SURGICAL PATHOLOGY

## 2021-11-06 NOTE — Progress Notes (Signed)
Pathology from EGD shows Candida esophagitis. ? ?No evidence of H. pylori, no celiac. ?Biopsy from colon unremarkable, repeat colonoscopy in 5 years. ? ?We will start patient on fluconazole 100 mg p.o. daily for 7 days. ?

## 2021-11-06 NOTE — Telephone Encounter (Signed)
Transition Care Management Follow-up Telephone Call ?Date of discharge and from where: 11/05/2021, [facility] ?How have you been since you were released from the hospital? She said she is okay ?Any questions or concerns? No ? ?Items Reviewed: ?Did the pt receive and understand the discharge instructions provided? Yes  ?Medications obtained and verified? Yes  - she said that she has all of her medications and did not have any questions about her med regime.  ?Other? Yes  ?Any new allergies since your discharge? No  ?Dietary orders reviewed? Yes - she is trying to adhere to a heart healthy diet.  ?Do you have support at home?  Lives alone ? ?Home Care and Equipment/Supplies: ?Were home health services ordered? no ?If so, what is the name of the agency? N/a  ?Has the agency set up a time to come to the patient's home? not applicable ?Were any new equipment or medical supplies ordered?  No ?What is the name of the medical supply agency? N/a ?Were you able to get the supplies/equipment? not applicable ?Do you have any questions related to the use of the equipment or supplies? No ? ?She has O2 that she uses @ 2L when sleeping and as needed during the day.  ?She also has a BP monitor but is is broken and she said she will be purchasing a new one.  ? ?Functional Questionnaire: (I = Independent and D = Dependent) ?ADLs: ambulates with cane.  Independent with personal care, medication management ? ? ?Follow up appointments reviewed: ? ?PCP Hospital f/u appt confirmed?  She said she has a lot of appointments coming up and she will call when she is ready to see PCP.   ?[facility] f/u appt confirmed? Yes  Scheduled to see cardiology - 5/1/20323, pulmonary - 11/29/2021.  ?Are transportation arrangements needed? No  ?If their condition worsens, is the pt aware to call PCP or go to the Emergency Dept.? Yes ?Was the patient provided with contact information for the PCP's office or ED? Yes ?Was to pt encouraged to  call back with questions or concerns? Yes ? ?

## 2021-11-12 ENCOUNTER — Telehealth (HOSPITAL_BASED_OUTPATIENT_CLINIC_OR_DEPARTMENT_OTHER): Payer: Self-pay

## 2021-11-12 ENCOUNTER — Other Ambulatory Visit (HOSPITAL_BASED_OUTPATIENT_CLINIC_OR_DEPARTMENT_OTHER): Payer: Self-pay

## 2021-11-12 NOTE — Telephone Encounter (Signed)
Transitions of Care Pharmacy  ? ?Call attempted for a pharmacy transitions of care follow-up.  ? ?Call attempt #1. Will follow-up in 2-3 days.  ?  ? ?Darcus Austin, PharmD ?Clinical Pharmacist ?[facility] ?11/12/2021 11:16 AM ? ?

## 2021-11-13 ENCOUNTER — Other Ambulatory Visit (HOSPITAL_COMMUNITY): Payer: Self-pay

## 2021-11-18 ENCOUNTER — Ambulatory Visit: Payer: Medicare Other | Admitting: Cardiology

## 2021-11-19 ENCOUNTER — Ambulatory Visit: Payer: Medicare Other | Admitting: Cardiology

## 2021-11-19 ENCOUNTER — Encounter: Payer: Self-pay | Admitting: Cardiology

## 2021-11-19 VITALS — BP 150/88 | HR 79 | Temp 97.9°F | Resp 16 | Ht 63.0 in | Wt 231.2 lb

## 2021-11-19 DIAGNOSIS — I4729 Other ventricular tachycardia: Secondary | ICD-10-CM

## 2021-11-19 DIAGNOSIS — Z7901 Long term (current) use of anticoagulants: Secondary | ICD-10-CM

## 2021-11-19 DIAGNOSIS — I491 Atrial premature depolarization: Secondary | ICD-10-CM

## 2021-11-19 DIAGNOSIS — E782 Mixed hyperlipidemia: Secondary | ICD-10-CM

## 2021-11-19 DIAGNOSIS — I5033 Acute on chronic diastolic (congestive) heart failure: Secondary | ICD-10-CM

## 2021-11-19 DIAGNOSIS — I480 Paroxysmal atrial fibrillation: Secondary | ICD-10-CM

## 2021-11-19 MED ORDER — ENTRESTO 49-51 MG PO TABS: 1.0000 | ORAL_TABLET | Freq: Two times a day (BID) | ORAL | 0 refills | Status: AC

## 2021-11-19 NOTE — Progress Notes (Signed)
? ?ID:  [name] [name], DOB 1960-10-04, MRN [id number] ? ?PCP:  Kerin Perna, NP  ?Cardiologist:  Rex Kras, DO, [facility] (established care October 28, 2021) ? ?Chief Complaint  ?Patient presents with  ? New Patient (Initial Visit)  ? Hospitalization Follow-up  ? Atrial Fibrillation  ? Congestive Heart Failure  ? ? ?HPI  ?[name] [name] is a 61 y.o. African-American female whose past medical history and cardiovascular risk factors include: Paroxysmal atrial fibrillation status post direct-current cardioversion, Hypertension, asthma, HFpEF, paroxysmal atrial fibrillation, chronic anemia, COPD, alcohol abuse, postmenopausal female. ? ?During her recent hospitalization patient was evaluated for atrial fibrillation with rapid ventricular rate and underwent direct-current cardioversion to normal sinus rhythm.  The initial echocardiogram while she was in A-fib and noted reduced LVEF but surface echocardiogram which was performed in sinus rhythm noted preserved LVEF with grade 3 diastolic dysfunction and symptomatically consistent with acute HFpEF.  Her medications were uptitrated during hospitalization and she also underwent anemic work-up which included endoscopies as well.  She now presents for outpatient follow-up given her recent hospitalization ? ?Since discharge patient states that she is doing well and feels " renewed."  She is compliant with her medications; however, when I reviewed the medication bottles she has been out of her Delene Loll for about 1 week.  Her blood pressures are not well controlled and I suspect likely secondary to running out of Entresto.  She is currently on Farxiga and torsemide.  And she is also on AV nodal blocking agents and Eliquis for thromboembolic prophylaxis. ? ?She is also reduced the amount of alcohol intake since discharge and does not endorse evidence of bleeding. ? ?She was noted to have NSVT nocturnally while she was hospitalized.  She follows up with pulmonary medicine on Nov 29, 2021 and I have encouraged her to discuss sleep study as well. ? ?ALLERGIES: ?Allergies  ?Allergen Reactions  ? Sodium Ferric Gluconate [Ferrous Gluconate] Other (See Comments)  ?  Patient had near syncope with abdominal pain and diaphoresis about an hour after IV ferric gluconate although it was while she was on commode having bowel movements  ? Tetracyclines & Related Itching  ? ? ?MEDICATION LIST PRIOR TO VISIT: ?Current Meds  ?Medication Sig  ? albuterol (VENTOLIN HFA) 108 (90 Base) MCG/ACT inhaler Inhale 2 puffs into the lungs every 6 (six) hours as needed for wheezing or shortness of breath.  ? apixaban (ELIQUIS) 5 MG TABS tablet Take 1 tablet (5 mg total) by mouth 2 (two) times daily.  ? atorvastatin (LIPITOR) 20 MG tablet Take 1 tablet (20 mg total) by mouth at bedtime.  ? Blood Pressure Monitor KIT 1 kit by Does not apply route 3 (three) times daily as needed.  ? cholecalciferol (VITAMIN D3) 25 MCG (1000 UNIT) tablet Take 1,000 Units by mouth daily.  ? dapagliflozin propanediol (FARXIGA) 10 MG TABS tablet Take 1 tablet (10 mg total) by mouth daily.  ? fluconazole (DIFLUCAN) 200 MG tablet Take 1 tablet (200 mg total) by mouth daily.  ? fluticasone furoate-vilanterol (BREO ELLIPTA) 200-25 MCG/INH AEPB Inhale 1 puff into the lungs daily.  ? metoprolol succinate (TOPROL-XL) 50 MG 24 hr tablet Take 1 tablet (50 mg total) by mouth daily. Take with or immediately following a meal.  ? OXYGEN Inhale 2 L/min into the lungs continuous.  ? pantoprazole (PROTONIX) 40 MG tablet Take 1 tablet (40 mg total) by mouth daily.  ? sacubitril-valsartan (ENTRESTO) 49-51 MG Take 1 tablet by mouth 2 (two)  times daily.  ? thiamine 100 MG tablet Take 1 tablet (100 mg total) by mouth daily.  ? torsemide (DEMADEX) 10 MG tablet Take 1/2 tablet by mouth daily.  ? traZODone (DESYREL) 50 MG tablet Take 1 tablet (50 mg total) by mouth at bedtime.  ? [DISCONTINUED] sacubitril-valsartan (ENTRESTO) 24-26 MG Take 1 tablet by mouth 2 (two) times  daily.  ?  ? ?PAST MEDICAL HISTORY: ?Past Medical History:  ?Diagnosis Date  ? Asthma   ? Bloody stool   ? CHF (congestive heart failure) ([place])   ? Chronic diastolic heart failure ([place])   ? Dental decay   ? Gastritis, Helicobacter pylori 01/11/7627  ? Hypertension   ? Paroxysmal atrial fibrillation (HCC)   ? Pneumonia 06/21/2015  ? Prepyloric ulcer 06/22/2015  ? Seasonal allergies   ? Shortness of breath dyspnea   ? with exertion  ? ? ?PAST SURGICAL HISTORY: ?Past Surgical History:  ?Procedure Laterality Date  ? BARTHOLIN CYST MARSUPIALIZATION Left 1997  ? BIOPSY  11/04/2021  ? Procedure: BIOPSY;  Surgeon: Ronnette Juniper, MD;  Location: [facility];  Service: Gastroenterology;;  ? BUBBLE STUDY  10/31/2021  ? Procedure: BUBBLE STUDY;  Surgeon: Rex Kras, DO;  Location: [facility];  Service: Cardiovascular;;  ? [place]  ? Repair of PFO  ? CARDIOVERSION N/A 10/31/2021  ? Procedure: CARDIOVERSION;  Surgeon: Rex Kras, DO;  Location: [facility];  Service: Cardiovascular;  Laterality: N/A;  ? COLONOSCOPY N/A 2002  ? Performed secondary to mother's diagnosis of colon CA at age 31  ? COLONOSCOPY WITH PROPOFOL N/A 11/04/2021  ? Procedure: COLONOSCOPY WITH PROPOFOL;  Surgeon: Ronnette Juniper, MD;  Location: [facility];  Service: Gastroenterology;  Laterality: N/A;  ? ESOPHAGOGASTRODUODENOSCOPY (EGD) WITH PROPOFOL N/A 06/22/2015  ? Procedure: ESOPHAGOGASTRODUODENOSCOPY (EGD) WITH PROPOFOL;  Surgeon: Arta Silence, MD;  Location[facility];  Service: Endoscopy;  Laterality: N/A;  ? ESOPHAGOGASTRODUODENOSCOPY (EGD) WITH PROPOFOL N/A 11/04/2021  ? Procedure: ESOPHAGOGASTRODUODENOSCOPY (EGD) WITH PROPOFOL;  Surgeon: Ronnette Juniper, MD;  Location: [facility];  Service: Gastroenterology;  Laterality: N/A;  ? FRACTURE SURGERY    ? NASAL SINUS SURGERY Bilateral 1984  ? OVARIAN CYST REMOVAL Left 1990  ? POLYPECTOMY  11/04/2021  ? Procedure: POLYPECTOMY;  Surgeon: Ronnette Juniper, MD;  Location: [facility];  Service:  Gastroenterology;;  ? TEE WITHOUT CARDIOVERSION N/A 10/31/2021  ? Procedure: TRANSESOPHAGEAL ECHOCARDIOGRAM (TEE);  Surgeon: Rex Kras, DO;  Location: [facility];  Service: Cardiovascular;  Laterality: N/A;  ? TUBAL LIGATION    ? UMBILICAL HERNIA REPAIR N/A 2005  ? ? ?FAMILY HISTORY: ?The patient family history includes Cerebral aneurysm in her sister; Colon cancer (age of onset: 50) in her mother; Goiter in her sister; Hypertension in her father and mother. ? ?SOCIAL HISTORY:  ?The patient  reports that she has never smoked. She has never used smokeless tobacco. She reports that she does not currently use alcohol. She reports that she does not use drugs. ? ?REVIEW OF SYSTEMS: ?Review of Systems  ?Cardiovascular:  Positive for dyspnea on exertion. Negative for chest pain, leg swelling, near-syncope, palpitations, paroxysmal nocturnal dyspnea and syncope.  ?Respiratory:  Positive for shortness of breath.   ?Hematologic/Lymphatic: Negative for bleeding problem.  ? ?PHYSICAL EXAM: ? ?  11/19/2021  ? 11:48 AM 11/19/2021  ? 11:47 AM 11/05/2021  ?  9:00 AM  ?Vitals with BMI  ?Height  _0    ?Weight  231 lbs 3 oz   ?BMI  40.97   ?Systolic 315 176  301  ?Diastolic 88 98 57  ?Pulse 79 92 48  ? ? ?CONSTITUTIONAL: Age-appropriate, hemodynamically stable, well-developed and well-nourished. No acute distress.  ?SKIN: Skin is warm and dry. No rash noted. No cyanosis. No pallor. No jaundice ?HEAD: Normocephalic and atraumatic.  ?EYES: No scleral icterus ?MOUTH/THROAT: Moist oral membranes.  ?NECK: No JVD present. No thyromegaly noted. No carotid bruits  ?LYMPHATIC: No visible cervical adenopathy.  ?CHEST Normal respiratory effort. No intercostal retractions  ?LUNGS: Clear to auscultation bilaterally.  No stridor. No wheezes. No rales.  ?CARDIOVASCULAR: Regular rate and rhythm, positive S1-S2, no murmurs rubs or gallops appreciated. ?ABDOMINAL: Obese, soft, nontender, nondistended, positive bowel sounds in all 4 quadrants, no  apparent ascites.  ?EXTREMITIES: No peripheral edema, warm to touch, 2+ bilateral DP and PT pulses ?HEMATOLOGIC: No significant bruising ?NEUROLOGIC: Oriented to person, place, and time. Nonfocal. Normal mu

## 2021-11-20 ENCOUNTER — Other Ambulatory Visit: Payer: Self-pay

## 2021-11-20 ENCOUNTER — Telehealth: Payer: Self-pay

## 2021-11-20 ENCOUNTER — Other Ambulatory Visit: Payer: Self-pay | Admitting: Physician Assistant

## 2021-11-20 DIAGNOSIS — F418 Other specified anxiety disorders: Secondary | ICD-10-CM

## 2021-11-20 MED ORDER — DAPAGLIFLOZIN PROPANEDIOL 10 MG PO TABS: 10.0000 mg | ORAL_TABLET | Freq: Every day | ORAL | 1 refills | Status: DC

## 2021-11-20 MED ORDER — TORSEMIDE 10 MG PO TABS: 5.0000 mg | ORAL_TABLET | Freq: Every day | ORAL | 1 refills | Status: DC

## 2021-11-20 MED ORDER — APIXABAN 5 MG PO TABS: 5.0000 mg | ORAL_TABLET | Freq: Two times a day (BID) | ORAL | 1 refills | Status: DC

## 2021-11-20 MED ORDER — PANTOPRAZOLE SODIUM 40 MG PO TBEC: 40.0000 mg | DELAYED_RELEASE_TABLET | Freq: Every day | ORAL | 1 refills | Status: DC

## 2021-11-20 MED ORDER — ATORVASTATIN CALCIUM 20 MG PO TABS: 20.0000 mg | ORAL_TABLET | Freq: Every day | ORAL | 1 refills | Status: DC

## 2021-11-20 MED ORDER — METOPROLOL SUCCINATE ER 50 MG PO TB24: 50.0000 mg | ORAL_TABLET | Freq: Every day | ORAL | 1 refills | Status: DC

## 2021-11-25 ENCOUNTER — Telehealth: Payer: Self-pay

## 2021-11-25 ENCOUNTER — Other Ambulatory Visit: Payer: Self-pay

## 2021-11-25 NOTE — Telephone Encounter (Signed)
Pharmacy Transitions of Care Follow-up Telephone Call ? ?Date of discharge: 11/05/21  ?Discharge Diagnosis: COPD/Afib ? ?How have you been since you were released from the hospital? Patient did not want to discuss her medications.  ? ?Medication changes made at discharge: ?START taking these medications ? ?START taking these medications  ?atorvastatin 20 MG tablet ?Commonly known as: LIPITOR ?Take 1 tablet (20 mg total) by mouth at bedtime.  ?Eliquis 5 MG Tabs tablet ?Generic drug: apixaban ?Take 1 tablet (5 mg total) by mouth 2 (two) times daily.  ?Entresto 24-26 MG ?Generic drug: sacubitril-valsartan ?Take 1 tablet by mouth 2 (two) times daily.  ?Farxiga 10 MG Tabs tablet ?Generic drug: dapagliflozin propanediol ?Take 1 tablet (10 mg total) by mouth daily.  ?fluconazole 200 MG tablet ?Commonly known as: DIFLUCAN ?Take 1 tablet (200 mg total) by mouth daily.  ?metoprolol succinate 50 MG 24 hr tablet ?Commonly known as: TOPROL-XL ?Take 1 tablet (50 mg total) by mouth daily. Take with or immediately following a meal.  ?pantoprazole 40 MG tablet ?Commonly known as: Protonix ?Take 1 tablet (40 mg total) by mouth daily.  ?torsemide 10 MG tablet ?Commonly known as: DEMADEX ?Take 1/2 tablet by mouth daily.  ? ?CONTINUE taking these medications ? ?CONTINUE taking these medications  ?albuterol 108 (90 Base) MCG/ACT inhaler ?Commonly known as: VENTOLIN HFA ?Inhale 2 puffs into the lungs every 6 (six) hours as needed for wheezing or shortness of breath.  ?Blood Pressure Monitor Kit ?1 kit by Does not apply route 3 (three) times daily as needed.  ?cholecalciferol 25 MCG (1000 UNIT) tablet ?Commonly known as: VITAMIN D3  ?diclofenac Sodium 1 % Gel ?Commonly known as: VOLTAREN ?Apply as needed twice a day for knee pain  ?FLUoxetine 20 MG capsule ?Commonly known as: PROZAC ?Take 1 capsule (20 mg total) by mouth daily.  ?fluticasone furoate-vilanterol 200-25 MCG/INH Aepb ?Commonly known as: Breo Ellipta ?Inhale 1 puff into the  lungs daily.  ?hydrOXYzine 25 MG tablet ?Commonly known as: ATARAX ?TAKE 1 TABLET (25 MG TOTAL) BY MOUTH EVERY 8 (EIGHT) HOURS AS NEEDED.  ?loratadine 10 MG tablet ?Commonly known as: CLARITIN ?Take 1 tablet (10 mg total) by mouth daily.  ?OXYGEN  ?thiamine 100 MG tablet ?Take 1 tablet (100 mg total) by mouth daily.  ?traZODone 50 MG tablet ?Commonly known as: DESYREL ?Take 1 tablet (50 mg total) by mouth at bedtime.  ? ?STOP taking these medications ? ?STOP taking these medications  ?amLODipine 10 MG tablet ?Commonly known as: NORVASC  ?budesonide-formoterol 160-4.5 MCG/ACT inhaler ?Commonly known as: SYMBICORT  ?furosemide 40 MG tablet ?Commonly known as: Lasix  ?hydrochlorothiazide 25 MG tablet ?Commonly known as: HYDRODIURIL  ?potassium chloride SA 20 MEQ tablet ?Commonly known as: KLOR-CON M  ?valsartan 40 MG tablet ?Commonly known as: DIOVAN  ? ? ?Medication changes verified by the patient? no  ?  ? ?Medication Accessibility: ? ?Home Pharmacy: [facility]  ? ?Was the patient provided with refills on discharged medications? yes  ? ?Have all prescriptions been transferred from [facility] to home pharmacy? N/a  ? ?Is the patient able to afford medications? 0 ?Notable copays: 0 ?Eligible patient assistance: no ?  ? ?Medication Review: ?APIXABAN (ELIQUIS)  ?Apixaban 5 mg BID   ?- Discussed importance of taking medication around the same time everyday  ?- Reviewed potential DDIs with patient  ?- Advised patient of medications to avoid (NSAIDs, ASA)  ?- Educated that Tylenol (acetaminophen) will be the preferred analgesic to prevent risk of bleeding  ?-  Emphasized importance of monitoring for signs and symptoms of bleeding (abnormal bruising, prolonged bleeding, nose bleeds, bleeding from gums, discolored urine, black tarry stools)  ?- Advised patient to alert all providers of anticoagulation therapy prior to starting a new medication or having a procedure  ? ?Follow-up Appointments: ?Date Visit Type Length Department    ? 11/19/2021 11:00 AM OFFICE VISIT 30 min [facility] [[id number]]  ?Patient Instructions:   ?Please arrive 15 minutes prior to your appointment. This will allow [place] to verify and update your medical record and ensure a full appointment for you within the time allotted.  ?      ? 11/29/2021  9:00 AM HOSPITAL FU 30 30 min [facility] [[id number]]  ? ? ?If their condition worsens, is the pt aware to call PCP or go to the Emergency Dept.? no ? ?Final Patient Assessment: ?Patient did not want to discuss her medications.  ? ?Darcus Austin, PharmD ?Clinical Pharmacist ?[facility] ?11/25/2021 2:31 PM ? ?

## 2021-11-29 ENCOUNTER — Ambulatory Visit (INDEPENDENT_AMBULATORY_CARE_PROVIDER_SITE_OTHER): Payer: Medicare Other | Admitting: Pulmonary Disease

## 2021-11-29 ENCOUNTER — Encounter: Payer: Self-pay | Admitting: Pulmonary Disease

## 2021-11-29 VITALS — BP 112/64 | HR 66 | Temp 97.8°F

## 2021-11-29 DIAGNOSIS — G4734 Idiopathic sleep related nonobstructive alveolar hypoventilation: Secondary | ICD-10-CM | POA: Diagnosis not present

## 2021-11-29 DIAGNOSIS — I5022 Chronic systolic (congestive) heart failure: Secondary | ICD-10-CM | POA: Diagnosis not present

## 2021-11-29 DIAGNOSIS — J4520 Mild intermittent asthma, uncomplicated: Secondary | ICD-10-CM

## 2021-11-29 DIAGNOSIS — I480 Paroxysmal atrial fibrillation: Secondary | ICD-10-CM | POA: Diagnosis not present

## 2021-11-29 NOTE — Progress Notes (Signed)
? ?Synopsis: Referred in May 2023 for hospital follow up of COPD exacerbation ? ?Subjective:  ? ?PATIENT ID: [name] [name] GENDER: female DOB: 1961/04/18, MRN: [id number] ? ? ?HPI ? ?Chief Complaint  ?Patient presents with  ? Hospital followup   ?  Breathing has improved back to baseline. She is using o2 with sleep 2lpm. She is using her albuterol inhaler about 4 x per wk on average.   ? ? ?[name] [name] is a 61 year old woman, never smoker with history of asthma, alcohol abuse, atrial fibrillation, hypertension and HFpEF who was admitted at [facility] 4/9 to 4/18 for asthma exacerbation and atrial fibrillation with RVR. She returns to pulmonary clinic for follow up.  ? ?She was treated with steroid taper and ICS/LAMA/LABA nebulizer treatments while in the hospital. She had TEE cardiversion for the a fib with RVR on 10/31/21 and new LVEF of 25-30%. She had EGD on 4/17 esophageal plaques (biopsied), 2 cm hiatal hernia and erythematous mucosa in the antrum (biopsied) and normal duodenum (biopsied).  Colonoscopy with normal examined ileum, colon polyps and nonbleeding internal hemorrhoid. ? ?She was discharged with Breo ellipta 200 inhaler 1 puff every other day. She is using albuterol inhaler about 4 times per week. She is using oxygen 2L at night.  ? ?She has stress test on Monday with cardiology. ? ?She reports doing well since her hospitalization. She is rarely drinking alcohol now. She denies cough, wheezing or dyspnea. She denies night time awakenings.  ? ?Past Medical History:  ?Diagnosis Date  ? Asthma   ? Bloody stool   ? CHF (congestive heart failure) ([place])   ? Chronic diastolic heart failure ([place])   ? Dental decay   ? Gastritis, Helicobacter pylori 19/14/7829  ? Hypertension   ? Paroxysmal atrial fibrillation ([facility])   ? Pneumonia 06/21/2015  ? Prepyloric ulcer 06/22/2015  ? Seasonal allergies   ? Shortness of breath dyspnea   ? with exertion  ?  ? ?Family History  ?Problem Relation Age of Onset  ? Colon cancer Mother 72   ? Hypertension Mother   ? Hypertension Father   ? Cerebral aneurysm Sister   ? Goiter Sister   ? Breast cancer Neg Hx   ?  ? ?Social History  ? ?Socioeconomic History  ? Marital status: Divorced  ?  Spouse name: Not on file  ? Number of children: 4  ? Years of education: Not on file  ? Highest education level: Not on file  ?Occupational History  ? Occupation: Kitchen/nutrition at [employer]  ?  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  ?Tobacco Use  ? Smoking status: Never  ? Smokeless tobacco: Never  ?Vaping Use  ? Vaping Use: Never used  ?Substance and Sexual Activity  ? Alcohol use: Not Currently  ?  Comment: previously 3-4 beers per day  ? Drug use: No  ? Sexual activity: Yes  ?Other Topics Concern  ? Not on file  ?Social History Narrative  ? Born in [place], but grew up in [place]  ? Was homeless Sept. 2016, when initially established  ? Now has own apartment, lives alone  ? Brother and 2 sons live in town, improving relationships.  ? States ended up homeless due to difficulty with living situation when children moved to [place] to live with her--she ended up leaving to get away from them and did not have a job.    ? Not ready to discuss details  ? ?Social Determinants of Health  ? ?  Financial Resource Strain: Not on file  ?Food Insecurity: Not on file  ?Transportation Needs: Not on file  ?Physical Activity: Not on file  ?Stress: Not on file  ?Social Connections: Not on file  ?Intimate Partner Violence: Not on file  ?  ? ?Allergies  ?Allergen Reactions  ? Sodium Ferric Gluconate [Ferrous Gluconate] Other (See Comments)  ?  Patient had near syncope with abdominal pain and diaphoresis about an hour after IV ferric gluconate although it was while she was on commode having bowel movements  ? Tetracyclines & Related Itching  ?  ? ?Outpatient Medications Prior to Visit  ?Medication Sig Dispense Refill  ? albuterol (VENTOLIN HFA) 108 (90 Base) MCG/ACT inhaler Inhale 2 puffs into the  lungs every 6 (six) hours as needed for wheezing or shortness of breath. 8.5 g 1  ? apixaban (ELIQUIS) 5 MG TABS tablet Take 1 tablet (5 mg total) by mouth 2 (two) times daily. 180 tablet 1  ? atorvastatin (LIPITOR) 20 MG tablet Take 1 tablet (20 mg total) by mouth at bedtime. 90 tablet 1  ? Blood Pressure Monitor KIT 1 kit by Does not apply route 3 (three) times daily as needed. 1 kit 0  ? cholecalciferol (VITAMIN D3) 25 MCG (1000 UNIT) tablet Take 1,000 Units by mouth daily.    ? dapagliflozin propanediol (FARXIGA) 10 MG TABS tablet Take 1 tablet (10 mg total) by mouth daily. 90 tablet 1  ? fluticasone furoate-vilanterol (BREO ELLIPTA) 200-25 MCG/INH AEPB Inhale 1 puff into the lungs daily. 60 each 11  ? metoprolol succinate (TOPROL-XL) 50 MG 24 hr tablet Take 1 tablet (50 mg total) by mouth daily. Take with or immediately following a meal. 90 tablet 1  ? OXYGEN Inhale 2 L/min into the lungs continuous.    ? pantoprazole (PROTONIX) 40 MG tablet Take 1 tablet (40 mg total) by mouth daily. 90 tablet 1  ? sacubitril-valsartan (ENTRESTO) 49-51 MG Take 1 tablet by mouth 2 (two) times daily. 180 tablet 0  ? thiamine 100 MG tablet Take 1 tablet (100 mg total) by mouth daily. 30 tablet 0  ? torsemide (DEMADEX) 10 MG tablet Take 1/2 tablet by mouth daily. 45 tablet 1  ? traZODone (DESYREL) 50 MG tablet Take 1 tablet (50 mg total) by mouth at bedtime. 30 tablet 1  ? fluconazole (DIFLUCAN) 200 MG tablet Take 1 tablet (200 mg total) by mouth daily. 12 tablet 0  ? hydrochlorothiazide (HYDRODIURIL) 25 MG tablet Take 25 mg by mouth daily.    ? ?No facility-administered medications prior to visit.  ? ? ?Review of Systems  ?Constitutional:  Negative for chills, fever, malaise/fatigue and weight loss.  ?HENT:  Negative for congestion, sinus pain and sore throat.   ?Eyes: Negative.   ?Respiratory:  Negative for cough, hemoptysis, sputum production, shortness of breath and wheezing.   ?Cardiovascular:  Negative for chest pain,  palpitations, orthopnea, claudication and leg swelling.  ?Gastrointestinal:  Negative for abdominal pain, heartburn, nausea and vomiting.  ?Genitourinary: Negative.   ?Musculoskeletal:  Negative for joint pain and myalgias.  ?Skin:  Negative for rash.  ?Neurological:  Negative for weakness.  ?Endo/Heme/Allergies: Negative.   ?Psychiatric/Behavioral: Negative.    ? ? ? ?Objective:  ? ?Vitals:  ? 11/29/21 0859  ?BP: 112/64  ?Pulse: 66  ?Temp: 97.8 ?F (36.6 ?C)  ?TempSrc: Oral  ?SpO2: 99%  ? ?Physical Exam ?Constitutional:   ?   General: She is not in acute distress. ?   Appearance: She is obese.  She is not ill-appearing.  ?HENT:  ?   Head: Normocephalic and atraumatic.  ?Eyes:  ?   General: No scleral icterus. ?   Conjunctiva/sclera: Conjunctivae normal.  ?   Pupils: Pupils are equal, round, and reactive to light.  ?Cardiovascular:  ?   Rate and Rhythm: Normal rate and regular rhythm.  ?   Pulses: Normal pulses.  ?   Heart sounds: Normal heart sounds. No murmur heard. ?Pulmonary:  ?   Effort: Pulmonary effort is normal.  ?   Breath sounds: Normal breath sounds. No wheezing, rhonchi or rales.  ?Abdominal:  ?   General: Bowel sounds are normal.  ?   Palpations: Abdomen is soft.  ?Musculoskeletal:  ?   Right lower leg: No edema.  ?   Left lower leg: No edema.  ?Lymphadenopathy:  ?   Cervical: No cervical adenopathy.  ?Skin: ?   General: Skin is warm and dry.  ?Neurological:  ?   General: No focal deficit present.  ?   Mental Status: She is alert.  ?Psychiatric:     ?   Mood and Affect: Mood normal.     ?   Behavior: Behavior normal.     ?   Thought Content: Thought content normal.     ?   Judgment: Judgment normal.  ? ? ?CBC ?   ?Component Value Date/Time  ? WBC 16.1 (H) 11/05/2021 0329  ? RBC 3.60 (L) 11/05/2021 0329  ? HGB 9.5 (L) 11/05/2021 0329  ? HGB 13.1 08/22/2021 1143  ? HCT 30.4 (L) 11/05/2021 0329  ? HCT 39.8 08/22/2021 1143  ? PLT 248 11/05/2021 0329  ? PLT 285 08/22/2021 1143  ? MCV 84.4 11/05/2021 0329  ?  MCV 87 08/22/2021 1143  ? MCH 26.4 11/05/2021 0329  ? MCHC 31.3 11/05/2021 0329  ? RDW 20.0 (H) 11/05/2021 0329  ? RDW 14.9 08/22/2021 1143  ? LYMPHSABS 0.7 10/28/2021 0020  ? LYMPHSABS 2.1 08/22/2021 1143  ? M

## 2021-11-29 NOTE — Patient Instructions (Signed)
Continue breo ellipta 1 puff daily for your asthma ?- rinse mouth out after each use ? ?Use albuterol inhaler 1 puff every 4-6 hours as needed ? ?We will schedule you for in lab sleep study ? ?Follow up in 3-4 months with pulmonary function tests ?

## 2021-12-02 ENCOUNTER — Other Ambulatory Visit: Payer: Medicare Other

## 2021-12-09 ENCOUNTER — Other Ambulatory Visit (INDEPENDENT_AMBULATORY_CARE_PROVIDER_SITE_OTHER): Payer: Self-pay | Admitting: Primary Care

## 2021-12-09 DIAGNOSIS — J4541 Moderate persistent asthma with (acute) exacerbation: Secondary | ICD-10-CM

## 2021-12-20 ENCOUNTER — Encounter (INDEPENDENT_AMBULATORY_CARE_PROVIDER_SITE_OTHER): Payer: Self-pay | Admitting: Primary Care

## 2021-12-20 ENCOUNTER — Ambulatory Visit (INDEPENDENT_AMBULATORY_CARE_PROVIDER_SITE_OTHER): Payer: Medicare Other | Admitting: Primary Care

## 2021-12-20 VITALS — BP 155/97 | HR 60 | Temp 98.0°F | Ht 63.0 in | Wt 233.0 lb

## 2021-12-20 DIAGNOSIS — Z09 Encounter for follow-up examination after completed treatment for conditions other than malignant neoplasm: Secondary | ICD-10-CM | POA: Diagnosis not present

## 2021-12-20 NOTE — Progress Notes (Signed)
[facility]   Subjective:  [name] [name] is a 61 y.o. female presents for hospital follow up . Admit date to the hospital was 10/27/21, patient was discharged from the hospital on 11/05/21, patient was admitted for:  Alcohol use disorder,Anxiety with depression,Acute on chronic respiratory failure with hypoxia ([place]), COPD with acute exacerbation (HCC),Paroxysmal atrial fibrillation (HCC),Hypertension, Acute on chronic combined CHF, Iron deficiency anemia, Obesity, Class III, BMI 40-49.9 (morbid obesity) ([place]), Euthyroid sick syndrome, Bandemia, Hyponatremia, and Esophageal candidiasis .  She also seen cardiology and pulmonology for hospital follow-up.Patient has No headache, No chest pain, No abdominal pain - No Nausea, No new weakness tingling or numbness, No Cough - shortness of breath with exertion.    Past Medical History:  Diagnosis Date   Asthma    Bloody stool    CHF (congestive heart failure) (HCC)    Chronic diastolic heart failure (HCC)    Dental decay    Gastritis, Helicobacter pylori 40/98/1191   Hypertension    Paroxysmal atrial fibrillation (HCC)    Pneumonia 06/21/2015   Prepyloric ulcer 06/22/2015   Seasonal allergies    Shortness of breath dyspnea    with exertion     Allergies  Allergen Reactions   Sodium Ferric Gluconate [Ferrous Gluconate] Other (See Comments)    Patient had near syncope with abdominal pain and diaphoresis about an hour after IV ferric gluconate although it was while she was on commode having bowel movements   Tetracyclines & Related Itching      Current Outpatient Medications on File Prior to Visit  Medication Sig Dispense Refill   albuterol (VENTOLIN HFA) 108 (90 Base) MCG/ACT inhaler Inhale 2 puffs into the lungs every 6 (six) hours as needed for wheezing or shortness of breath. 8.5 g 1   apixaban (ELIQUIS) 5 MG TABS tablet Take 1 tablet (5 mg total) by mouth 2 (two) times daily. 180 tablet 1   atorvastatin (LIPITOR) 20  MG tablet Take 1 tablet (20 mg total) by mouth at bedtime. 90 tablet 1   Blood Pressure Monitor KIT 1 kit by Does not apply route 3 (three) times daily as needed. 1 kit 0   cholecalciferol (VITAMIN D3) 25 MCG (1000 UNIT) tablet Take 1,000 Units by mouth daily.     dapagliflozin propanediol (FARXIGA) 10 MG TABS tablet Take 1 tablet (10 mg total) by mouth daily. 90 tablet 1   fluticasone furoate-vilanterol (BREO ELLIPTA) 200-25 MCG/INH AEPB Inhale 1 puff into the lungs daily. 60 each 11   metoprolol succinate (TOPROL-XL) 50 MG 24 hr tablet Take 1 tablet (50 mg total) by mouth daily. Take with or immediately following a meal. 90 tablet 1   OXYGEN Inhale 2 L/min into the lungs continuous.     pantoprazole (PROTONIX) 40 MG tablet Take 1 tablet (40 mg total) by mouth daily. 90 tablet 1   sacubitril-valsartan (ENTRESTO) 49-51 MG Take 1 tablet by mouth 2 (two) times daily. 180 tablet 0   SYMBICORT 160-4.5 MCG/ACT inhaler Inhale 2 puffs into the lungs 2 (two) times daily.     thiamine 100 MG tablet Take 1 tablet (100 mg total) by mouth daily. 30 tablet 0   torsemide (DEMADEX) 10 MG tablet Take 1/2 tablet by mouth daily. 45 tablet 1   traZODone (DESYREL) 50 MG tablet Take 1 tablet (50 mg total) by mouth at bedtime. 30 tablet 1   No current facility-administered medications on file prior to visit.     Review of System: Comprehensive  ROS Pertinent positive and negative noted in HPI  .  Objective:  BP (!) 156/89   Pulse 68   Temp 98 F (36.7 C) (Oral)   Ht '5\' 3"'  (1.6 m)   Wt 233 lb (105.7 kg)   SpO2 94%   BMI 41.27 kg/m   Filed Weights   12/20/21 0936  Weight: 233 lb (105.7 kg)    Physical Exam: General Appearance: Well nourished, morbid obese in no apparent distress. Eyes: PERRLA, EOMs, conjunctiva no swelling or erythema Sinuses: No Frontal/maxillary tenderness ENT/Mouth: Ext aud canals clear,  Hearing normal.  Neck: Supple, thyroid normal.  Respiratory: Respiratory effort normal, BS  equal bilaterally without rales, rhonchi, wheezing or stridor.  Cardio: RRR with no MRGs. Brisk peripheral pulses without edema.  Abdomen: Soft, + BS.  Non tender, no guarding, rebound, hernias, masses. Lymphatics: Non tender without lymphadenopathy.  Musculoskeletal: Full ROM, 5/5 strength, normal gait.  Skin: Warm, dry without rashes, lesions, ecchymosis.  Neuro: Cranial nerves intact. Normal muscle tone, no cerebellar symptoms. Sensation intact.  Psych: Awake and oriented X 3, normal affect, Insight and Judgment appropriate.    Assessment:  [name] [name] was seen today for hospitalization follow-up.  Diagnoses and all orders for this visit:  Hospital discharge follow-up Follow-Ups retrieved from d/c  Follow up with [name], MD (Cardiology) on 11/18/2021; 9:45 AM Schedule an appointment with Kerin Perna, NP (Internal Medicine) in 1 week (11/12/2021)    This note has been created with Dragon speech recognition software and smart phrase technology. Any transcriptional errors are unintentional.   Kerin Perna, NP 12/20/2021, 9:46 AM

## 2021-12-31 ENCOUNTER — Ambulatory Visit: Payer: Medicare Other | Admitting: Cardiology

## 2022-01-01 ENCOUNTER — Ambulatory Visit (HOSPITAL_BASED_OUTPATIENT_CLINIC_OR_DEPARTMENT_OTHER): Payer: Medicare Other | Attending: Pulmonary Disease | Admitting: Pulmonary Disease

## 2022-01-01 DIAGNOSIS — J4520 Mild intermittent asthma, uncomplicated: Secondary | ICD-10-CM | POA: Insufficient documentation

## 2022-01-01 DIAGNOSIS — G4733 Obstructive sleep apnea (adult) (pediatric): Secondary | ICD-10-CM | POA: Insufficient documentation

## 2022-01-01 DIAGNOSIS — G4734 Idiopathic sleep related nonobstructive alveolar hypoventilation: Secondary | ICD-10-CM | POA: Diagnosis present

## 2022-01-01 DIAGNOSIS — J449 Chronic obstructive pulmonary disease, unspecified: Secondary | ICD-10-CM | POA: Diagnosis not present

## 2022-01-01 DIAGNOSIS — F32A Depression, unspecified: Secondary | ICD-10-CM | POA: Insufficient documentation

## 2022-01-01 DIAGNOSIS — I480 Paroxysmal atrial fibrillation: Secondary | ICD-10-CM | POA: Insufficient documentation

## 2022-01-01 DIAGNOSIS — I10 Essential (primary) hypertension: Secondary | ICD-10-CM | POA: Insufficient documentation

## 2022-01-01 DIAGNOSIS — R0683 Snoring: Secondary | ICD-10-CM | POA: Insufficient documentation

## 2022-01-01 DIAGNOSIS — I5022 Chronic systolic (congestive) heart failure: Secondary | ICD-10-CM | POA: Insufficient documentation

## 2022-01-02 DIAGNOSIS — G4734 Idiopathic sleep related nonobstructive alveolar hypoventilation: Secondary | ICD-10-CM

## 2022-01-02 NOTE — Procedures (Signed)
     Patient Name: [name] [name], [name] [name] Date: 01/01/2022 Gender: Female D.O.B: 12-17-1960 Age (years): 60 Referring Provider: Freda Jackson Height (inches): 46 Interpreting Physician: Chesley Mires MD, ABSM Weight (lbs): 231 RPSGT: Jorge Ny BMI: 39 MRN: [id number] Neck Size: 16.50  CLINICAL INFORMATION Sleep Study Type: NPSG  Indication for sleep study: Congestive Heart Failure, COPD, Depression, Hypertension, Morbid Obesity, Snoring  Epworth Sleepiness Score: 10  SLEEP STUDY TECHNIQUE As per the AASM Manual for the Scoring of Sleep and Associated Events v2.3 (April 2016) with a hypopnea requiring 4% desaturations.  The channels recorded and monitored were frontal, central and occipital EEG, electrooculogram (EOG), submentalis EMG (chin), nasal and oral airflow, thoracic and abdominal wall motion, anterior tibialis EMG, snore microphone, electrocardiogram, and pulse oximetry.  MEDICATIONS Medications self-administered by patient taken the night of the study : ATORVASTATIN  SLEEP ARCHITECTURE The study was initiated at 10:29:01 PM and ended at 4:45:22 AM.  Sleep onset time was 68.6 minutes and the sleep efficiency was 44.0%. The total sleep time was 165.5 minutes.  Stage REM latency was 187.5 minutes.  The patient spent 29.61% of the night in stage N1 sleep, 64.05% in stage N2 sleep, 0.00% in stage N3 and 6.3% in REM. He had difficulty with sleep initiation and sleep maintenance due to respiratory events.  Alpha intrusion was absent.  Supine sleep was 0.00%.  RESPIRATORY PARAMETERS The overall apnea/hypopnea index (AHI) was 63.8 per hour. There were 0 total apneas, including 0 obstructive, 0 central and 0 mixed apneas. There were 176 hypopneas and 26 RERAs.  The AHI during Stage REM sleep was 85.7 per hour.  AHI while supine was N/A per hour.  The mean oxygen saturation was 86.76%. The minimum SpO2 during sleep was 68.00%.  Soft snoring was noted during  this study.  CARDIAC DATA The 2 lead EKG demonstrated sinus rhythm. The mean heart rate was 93.70 beats per minute. Other EKG findings include: PVCs.  LEG MOVEMENT DATA The total PLMS were 0 with a resulting PLMS index of 0.00. Associated arousal with leg movement index was 0.0 .  IMPRESSIONS - Severe obstructive sleep apnea with an AHI of 63.8 and SpO2 low of 68%.  DIAGNOSIS - Obstructive Sleep Apnea (G47.33) - Nocturnal Hypoxemia (G47.36)  RECOMMENDATIONS - Given the severity of his sleep apnea and oxygen desaturation he should be referred back to the sleep lab for a CPAP titration study.  Alternative options include auto CPAP set up with pressure range 5 to 20 cm H2O, oral appliance, or surgical assessment. - Avoid alcohol, sedatives and other CNS depressants that may worsen sleep apnea and disrupt normal sleep architecture. - Sleep hygiene should be reviewed to assess factors that may improve sleep quality. - Weight management and regular exercise should be initiated or continued if appropriate.  [Electronically signed] 01/02/2022 02:27 PM  Chesley Mires MD, ABSM Diplomate, American Board of Sleep Medicine NPI: [id number]  [place] PH: [phone]   FX: [phone] [place]

## 2022-01-07 ENCOUNTER — Telehealth: Payer: Self-pay | Admitting: Pulmonary Disease

## 2022-01-07 NOTE — Telephone Encounter (Signed)
Please let patient know she has severe sleep apnea. Given the severity of sleep apnea and oxygen desaturation she should be referred back to the sleep lab for a CPAP titration study.  Place order if she is ok with moving forward with titration study.  Thanks, Wille Glaser

## 2022-01-08 NOTE — Telephone Encounter (Signed)
Called and spoke with patient, provided results/recommendations per Dr. Erin Fulling.  She stated that she did not want to go back to the sleep lab for the CPAP titration study and wants to just remain on her oxygen at night.  Advised that it is dangerous not to treat severe sleep apnea and can cause heart and other problems.  She was not happy to hear that over the phone.  I advised her that we generally have patients follow up in the office to go over the results/recommendations with the provider and I would be glad to schedule her an OV eith Dr. Erin Fulling to discuss this with her and answer any of her questions and explain the risks of not treating sleep apnea.  Scheduled her to see Dr. Erin Fulling at 4 pm on 6/22, advised to arrive by 3:45 pm for check in.  She verbalized understanding.  Nothing further needed.

## 2022-01-09 ENCOUNTER — Ambulatory Visit: Payer: Medicare Other | Admitting: Pulmonary Disease

## 2022-01-09 NOTE — Progress Notes (Deleted)
Synopsis: Referred in May 2023 for hospital follow up of COPD exacerbation  Subjective:   PATIENT ID: [name] [name] GENDER: female DOB: March 29, 1961, MRN: [id number]   HPI  No chief complaint on file.   [name] [name] is a 61 year old woman, never smoker with history of asthma, alcohol abuse, atrial fibrillation, hypertension and HFpEF who returns to pulmonary clinic for follow up.     OV 11/29/21 She was treated with steroid taper and ICS/LAMA/LABA nebulizer treatments while in the hospital. She had TEE cardiversion for the a fib with RVR on 10/31/21 and new LVEF of 25-30%. She had EGD on 4/17 esophageal plaques (biopsied), 2 cm hiatal hernia and erythematous mucosa in the antrum (biopsied) and normal duodenum (biopsied).  Colonoscopy with normal examined ileum, colon polyps and nonbleeding internal hemorrhoid.  She was discharged with Breo ellipta 200 inhaler 1 puff every other day. She is using albuterol inhaler about 4 times per week. She is using oxygen 2L at night.   She has stress test on Monday with cardiology.  She reports doing well since her hospitalization. She is rarely drinking alcohol now. She denies cough, wheezing or dyspnea. She denies night time awakenings.   Past Medical History:  Diagnosis Date   Asthma    Bloody stool    CHF (congestive heart failure) (HCC)    Chronic diastolic heart failure (HCC)    Dental decay    Gastritis, Helicobacter pylori 84/13/2440   Hypertension    Paroxysmal atrial fibrillation (HCC)    Pneumonia 06/21/2015   Prepyloric ulcer 06/22/2015   Seasonal allergies    Shortness of breath dyspnea    with exertion     Family History  Problem Relation Age of Onset   Colon cancer Mother 72   Hypertension Mother    Hypertension Father    Cerebral aneurysm Sister    Goiter Sister    Breast cancer Neg Hx      Social History   Socioeconomic History   Marital status: Divorced    Spouse name: Not on file   Number of children: 4    Years of education: Not on file   Highest education level: Not on file  Occupational History   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  Tobacco Use   Smoking status: Never   Smokeless tobacco: Never  Vaping Use   Vaping Use: Never used  Substance and Sexual Activity   Alcohol use: Not Currently    Comment: previously 3-4 beers per day   Drug use: No   Sexual activity: Yes  Other Topics Concern   Not on file  Social History Narrative   Born in [place], but grew up in [place]   Was homeless Sept. 2016, when initially established   Now has own apartment, lives alone   Brother and 2 sons live in town, improving relationships.   States ended up homeless due to difficulty with living situation when children moved to [place] to live with her--she ended up leaving to get away from them and did not have a job.     Not ready to discuss details   Social Determinants of Health   Financial Resource Strain: Not on file  Food Insecurity: Not on file  Transportation Needs: Not on file  Physical Activity: Not on file  Stress: Not on file  Social Connections: Not on file  Intimate Partner Violence: Not on file     Allergies  Allergen Reactions   Sodium Ferric Gluconate [Ferrous Gluconate] Other (See Comments)    Patient had near syncope with abdominal pain and diaphoresis about an hour after IV ferric gluconate although it was while she was on commode having bowel movements   Tetracyclines & Related Itching     Outpatient Medications Prior to Visit  Medication Sig Dispense Refill   albuterol (VENTOLIN HFA) 108 (90 Base) MCG/ACT inhaler Inhale 2 puffs into the lungs every 6 (six) hours as needed for wheezing or shortness of breath. 8.5 g 1   apixaban (ELIQUIS) 5 MG TABS tablet Take 1 tablet (5 mg total) by mouth 2 (two) times daily. 180 tablet 1   atorvastatin (LIPITOR) 20 MG tablet Take 1 tablet (20 mg  total) by mouth at bedtime. 90 tablet 1   Blood Pressure Monitor KIT 1 kit by Does not apply route 3 (three) times daily as needed. 1 kit 0   cholecalciferol (VITAMIN D3) 25 MCG (1000 UNIT) tablet Take 1,000 Units by mouth daily.     dapagliflozin propanediol (FARXIGA) 10 MG TABS tablet Take 1 tablet (10 mg total) by mouth daily. 90 tablet 1   fluticasone furoate-vilanterol (BREO ELLIPTA) 200-25 MCG/INH AEPB Inhale 1 puff into the lungs daily. 60 each 11   metoprolol succinate (TOPROL-XL) 50 MG 24 hr tablet Take 1 tablet (50 mg total) by mouth daily. Take with or immediately following a meal. 90 tablet 1   OXYGEN Inhale 2 L/min into the lungs continuous.     pantoprazole (PROTONIX) 40 MG tablet Take 1 tablet (40 mg total) by mouth daily. 90 tablet 1   sacubitril-valsartan (ENTRESTO) 49-51 MG Take 1 tablet by mouth 2 (two) times daily. 180 tablet 0   SYMBICORT 160-4.5 MCG/ACT inhaler Inhale 2 puffs into the lungs 2 (two) times daily.     thiamine 100 MG tablet Take 1 tablet (100 mg total) by mouth daily. 30 tablet 0   torsemide (DEMADEX) 10 MG tablet Take 1/2 tablet by mouth daily. 45 tablet 1   traZODone (DESYREL) 50 MG tablet Take 1 tablet (50 mg total) by mouth at bedtime. 30 tablet 1   No facility-administered medications prior to visit.    Review of Systems  Constitutional:  Negative for chills, fever, malaise/fatigue and weight loss.  HENT:  Negative for congestion, sinus pain and sore throat.   Eyes: Negative.   Respiratory:  Negative for cough, hemoptysis, sputum production, shortness of breath and wheezing.   Cardiovascular:  Negative for chest pain, palpitations, orthopnea, claudication and leg swelling.  Gastrointestinal:  Negative for abdominal pain, heartburn, nausea and vomiting.  Genitourinary: Negative.   Musculoskeletal:  Negative for joint pain and myalgias.  Skin:  Negative for rash.  Neurological:  Negative for weakness.  Endo/Heme/Allergies: Negative.    Psychiatric/Behavioral: Negative.        Objective:   There were no vitals filed for this visit.  Physical Exam Constitutional:      General: She is not in acute distress.    Appearance: She is obese. She is not ill-appearing.  HENT:     Head: Normocephalic and atraumatic.  Eyes:     General: No scleral icterus.    Conjunctiva/sclera: Conjunctivae normal.     Pupils: Pupils are equal, round, and reactive to light.  Cardiovascular:     Rate and Rhythm: Normal rate and regular rhythm.     Pulses: Normal pulses.     Heart sounds: Normal heart sounds. No murmur heard. Pulmonary:  Effort: Pulmonary effort is normal.     Breath sounds: Normal breath sounds. No wheezing, rhonchi or rales.  Abdominal:     General: Bowel sounds are normal.     Palpations: Abdomen is soft.  Musculoskeletal:     Right lower leg: No edema.     Left lower leg: No edema.  Lymphadenopathy:     Cervical: No cervical adenopathy.  Skin:    General: Skin is warm and dry.  Neurological:     General: No focal deficit present.     Mental Status: She is alert.  Psychiatric:        Mood and Affect: Mood normal.        Behavior: Behavior normal.        Thought Content: Thought content normal.        Judgment: Judgment normal.     CBC    Component Value Date/Time   WBC 16.1 (H) 11/05/2021 0329   RBC 3.60 (L) 11/05/2021 0329   HGB 9.5 (L) 11/05/2021 0329   HGB 13.1 08/22/2021 1143   HCT 30.4 (L) 11/05/2021 0329   HCT 39.8 08/22/2021 1143   PLT 248 11/05/2021 0329   PLT 285 08/22/2021 1143   MCV 84.4 11/05/2021 0329   MCV 87 08/22/2021 1143   MCH 26.4 11/05/2021 0329   MCHC 31.3 11/05/2021 0329   RDW 20.0 (H) 11/05/2021 0329   RDW 14.9 08/22/2021 1143   LYMPHSABS 0.7 10/28/2021 0020   LYMPHSABS 2.1 08/22/2021 1143   MONOABS 0.1 10/28/2021 0020   EOSABS 0.1 10/28/2021 0020   EOSABS 0.1 08/22/2021 1143   BASOSABS 0.0 10/28/2021 0020   BASOSABS 0.0 08/22/2021 1143      Latest Ref Rng &  Units 11/05/2021    3:29 AM 11/04/2021    3:36 AM 11/03/2021    3:38 AM  BMP  Glucose 70 - 99 mg/dL 114  123  106   BUN 6 - 20 mg/dL '7  6  9   ' Creatinine 0.44 - 1.00 mg/dL 0.62  0.58  0.57   Sodium 135 - 145 mmol/L 139  143  141   Potassium 3.5 - 5.1 mmol/L 4.0  4.1  3.8   Chloride 98 - 111 mmol/L 106  104  101   CO2 22 - 32 mmol/L 26  30  34   Calcium 8.9 - 10.3 mg/dL 8.6  8.6  8.5    Chest imaging: CTA Chest 10/29/21 1. Examination for pulmonary embolism is somewhat limited by breath motion artifact, particularly in the lung bases. Within this limitation, no evidence of pulmonary embolism through the segmental pulmonary arterial level. 2. Cardiomegaly. 3. Enlargement of the main pulmonary artery, as can be seen in pulmonary hypertension. 4. 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 Recommend dedicated CT angiogram of the abdomen and pelvis to further evaluate on a nonemergent basis, when clinically appropriate.  PFT:     No data to display           Labs:  Path:  Echo 11/01/21: LVEF 50-55%. Grade III diastolic dysfunction. RV systolic function is normal. LA size mildly dilated.   Heart Catheterization:   Assessment & Plan:   No diagnosis found.  Discussion: [name] [name] is a 61 year old woman, never smoker with history of asthma, alcohol abuse, atrial fibrillation, hypertension and HFpEF who was admitted at [facility] 4/9  to 4/18 for asthma exacerbation and atrial fibrillation with RVR. She returns to pulmonary clinic for follow up.  She is to continue breo ellipta 252mg 1 puff daily for her asthma. She is to use albuterol inhaler as needed. If she is doing well without exacerbation or high use of albuterol we can decrease her breo dose at next visit.   We will check pulmonary function tests at next visit.   Given her nocturnal hypoxemia,  systolic heart failure, atrial fibrillation and obesity we will check an in lab sleep study for concern of sleep disordered breathing.   I have encouraged her to continue to limit her alcohol use due to effects on her heart failure and possible sleep apnea.   Follow up in 3-4 months.  JFreda Jackson MD [facility] Office: [phone]  Current Outpatient Medications:    albuterol (VENTOLIN HFA) 108 (90 Base) MCG/ACT inhaler, Inhale 2 puffs into the lungs every 6 (six) hours as needed for wheezing or shortness of breath., Disp: 8.5 g, Rfl: 1   apixaban (ELIQUIS) 5 MG TABS tablet, Take 1 tablet (5 mg total) by mouth 2 (two) times daily., Disp: 180 tablet, Rfl: 1   atorvastatin (LIPITOR) 20 MG tablet, Take 1 tablet (20 mg total) by mouth at bedtime., Disp: 90 tablet, Rfl: 1   Blood Pressure Monitor KIT, 1 kit by Does not apply route 3 (three) times daily as needed., Disp: 1 kit, Rfl: 0   cholecalciferol (VITAMIN D3) 25 MCG (1000 UNIT) tablet, Take 1,000 Units by mouth daily., Disp: , Rfl:    dapagliflozin propanediol (FARXIGA) 10 MG TABS tablet, Take 1 tablet (10 mg total) by mouth daily., Disp: 90 tablet, Rfl: 1   fluticasone furoate-vilanterol (BREO ELLIPTA) 200-25 MCG/INH AEPB, Inhale 1 puff into the lungs daily., Disp: 60 each, Rfl: 11   metoprolol succinate (TOPROL-XL) 50 MG 24 hr tablet, Take 1 tablet (50 mg total) by mouth daily. Take with or immediately following a meal., Disp: 90 tablet, Rfl: 1   OXYGEN, Inhale 2 L/min into the lungs continuous., Disp: , Rfl:    pantoprazole (PROTONIX) 40 MG tablet, Take 1 tablet (40 mg total) by mouth daily., Disp: 90 tablet, Rfl: 1   sacubitril-valsartan (ENTRESTO) 49-51 MG, Take 1 tablet by mouth 2 (two) times daily., Disp: 180 tablet, Rfl: 0   SYMBICORT 160-4.5 MCG/ACT inhaler, Inhale 2 puffs into the lungs 2 (two) times daily., Disp: , Rfl:    thiamine 100 MG tablet, Take 1 tablet (100 mg total) by mouth daily., Disp: 30  tablet, Rfl: 0   torsemide (DEMADEX) 10 MG tablet, Take 1/2 tablet by mouth daily., Disp: 45 tablet, Rfl: 1   traZODone (DESYREL) 50 MG tablet, Take 1 tablet (50 mg total) by mouth at bedtime., Disp: 30 tablet, Rfl: 1

## 2022-02-12 ENCOUNTER — Other Ambulatory Visit (INDEPENDENT_AMBULATORY_CARE_PROVIDER_SITE_OTHER): Payer: Self-pay | Admitting: Primary Care

## 2022-02-12 NOTE — Telephone Encounter (Signed)
Routed request to patients pulmonologist.

## 2022-03-14 ENCOUNTER — Other Ambulatory Visit: Payer: Self-pay | Admitting: Cardiology

## 2022-03-17 ENCOUNTER — Ambulatory Visit: Payer: Medicare Other | Admitting: Pulmonary Disease

## 2022-03-17 LAB — COMPREHENSIVE METABOLIC PANEL
Albumin: 3.9 (ref 3.5–5.0)
Calcium: 9 (ref 8.7–10.7)
Globulin: 3.2
eGFR: 72

## 2022-03-17 LAB — BASIC METABOLIC PANEL
BUN: 9 (ref 4–21)
CO2: 34 — AB (ref 13–22)
Chloride: 92 — AB (ref 99–108)
Creatinine: 0.9 (ref 0.5–1.1)
Potassium: 3.2 mEq/L — AB (ref 3.5–5.1)
Sodium: 140 (ref 137–147)

## 2022-03-17 LAB — CBC AND DIFFERENTIAL
HCT: 38 (ref 36–46)
Hemoglobin: 12.2 (ref 12.0–16.0)
Neutrophils Absolute: 6826
Platelets: 330 10*3/uL (ref 150–400)
WBC: 9.6

## 2022-03-17 LAB — TSH: TSH: 1.6 (ref 0.41–5.90)

## 2022-03-17 LAB — LIPID PANEL
Cholesterol: 213 — AB (ref 0–200)
HDL: 67 (ref 35–70)
LDL Cholesterol: 120
LDl/HDL Ratio: 3.2
Triglycerides: 138 (ref 40–160)

## 2022-03-17 LAB — IRON,TIBC AND FERRITIN PANEL: Ferritin: 11

## 2022-03-17 LAB — HEPATIC FUNCTION PANEL
ALT: 9 U/L (ref 7–35)
AST: 13 (ref 13–35)
Alkaline Phosphatase: 96 (ref 25–125)

## 2022-03-17 LAB — CBC: RBC: 4.53 (ref 3.87–5.11)

## 2022-03-17 NOTE — Progress Notes (Deleted)
Synopsis: Referred in May 2023 for hospital follow up of COPD exacerbation  Subjective:   PATIENT ID: [name] [name] GENDER: female DOB: 05-08-1961, MRN: [id number]  HPI  No chief complaint on file.  [name] [name] is a 61 year old woman, never smoker with history of asthma, alcohol abuse, atrial fibrillation, hypertension and HFpEF who was admitted at [facility] 4/9 to 4/18 for asthma exacerbation and atrial fibrillation with RVR. She returns to pulmonary clinic for follow up.   Sleep study 6/14 showed severe obstructive sleep apnea with AHI 63/hr and SpO2 low of 68%.  PFTs today show  OV 11/29/21 She was treated with steroid taper and ICS/LAMA/LABA nebulizer treatments while in the hospital. She had TEE cardiversion for the a fib with RVR on 10/31/21 and new LVEF of 25-30%. She had EGD on 4/17 esophageal plaques (biopsied), 2 cm hiatal hernia and erythematous mucosa in the antrum (biopsied) and normal duodenum (biopsied).  Colonoscopy with normal examined ileum, colon polyps and nonbleeding internal hemorrhoid.  She was discharged with Breo ellipta 200 inhaler 1 puff every other day. She is using albuterol inhaler about 4 times per week. She is using oxygen 2L at night.   She has stress test on Monday with cardiology.  She reports doing well since her hospitalization. She is rarely drinking alcohol now. She denies cough, wheezing or dyspnea. She denies night time awakenings.   Past Medical History:  Diagnosis Date   Asthma    Bloody stool    CHF (congestive heart failure) (HCC)    Chronic diastolic heart failure (HCC)    Dental decay    Gastritis, Helicobacter pylori 75/91/6384   Hypertension    Paroxysmal atrial fibrillation (HCC)    Pneumonia 06/21/2015   Prepyloric ulcer 06/22/2015   Seasonal allergies    Shortness of breath dyspnea    with exertion     Family History  Problem Relation Age of Onset   Colon cancer Mother 7   Hypertension Mother    Hypertension Father     Cerebral aneurysm Sister    Goiter Sister    Breast cancer Neg Hx      Social History   Socioeconomic History   Marital status: Divorced    Spouse name: Not on file   Number of children: 4   Years of education: Not on file   Highest education level: Not on file  Occupational History   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  Tobacco Use   Smoking status: Never   Smokeless tobacco: Never  Vaping Use   Vaping Use: Never used  Substance and Sexual Activity   Alcohol use: Not Currently    Comment: previously 3-4 beers per day   Drug use: No   Sexual activity: Yes  Other Topics Concern   Not on file  Social History Narrative   Born in [place], but grew up in [place]   Was homeless Sept. 2016, when initially established   Now has own apartment, lives alone   Brother and 2 sons live in town, improving relationships.   States ended up homeless due to difficulty with living situation when children moved to [place] to live with her--she ended up leaving to get away from them and did not have a job.     Not ready to discuss details   Social Determinants of Health   Financial Resource Strain: Not on file  Food Insecurity: Not on file  Transportation  Needs: Not on file  Physical Activity: Not on file  Stress: Not on file  Social Connections: Not on file  Intimate Partner Violence: Not on file     Allergies  Allergen Reactions   Sodium Ferric Gluconate [Ferrous Gluconate] Other (See Comments)    Patient had near syncope with abdominal pain and diaphoresis about an hour after IV ferric gluconate although it was while she was on commode having bowel movements   Tetracyclines & Related Itching     Outpatient Medications Prior to Visit  Medication Sig Dispense Refill   albuterol (VENTOLIN HFA) 108 (90 Base) MCG/ACT inhaler Inhale 2 puffs into the lungs every 6 (six) hours as needed for wheezing or  shortness of breath. 8.5 g 1   atorvastatin (LIPITOR) 20 MG tablet Take 1 tablet (20 mg total) by mouth at bedtime. 90 tablet 1   Blood Pressure Monitor KIT 1 kit by Does not apply route 3 (three) times daily as needed. 1 kit 0   cholecalciferol (VITAMIN D3) 25 MCG (1000 UNIT) tablet Take 1,000 Units by mouth daily.     ELIQUIS 5 MG TABS tablet TAKE 1 TABLET (5 MG TOTAL) BY MOUTH 2 (TWO) TIMES DAILY. 180 tablet 1   FARXIGA 10 MG TABS tablet TAKE 1 TABLET (10 MG TOTAL) BY MOUTH DAILY. 90 tablet 1   metoprolol succinate (TOPROL-XL) 50 MG 24 hr tablet Take 1 tablet (50 mg total) by mouth daily. Take with or immediately following a meal. 90 tablet 1   OXYGEN Inhale 2 L/min into the lungs continuous.     pantoprazole (PROTONIX) 40 MG tablet TAKE 1 TABLET (40 MG TOTAL) BY MOUTH DAILY FOR ACID REFLUX 90 tablet 1   SYMBICORT 160-4.5 MCG/ACT inhaler INHALE 2 PUFFS INTO THE LUNGS 2 (TWO) TIMES DAILY. 10.2 g 3   thiamine 100 MG tablet Take 1 tablet (100 mg total) by mouth daily. 30 tablet 0   torsemide (DEMADEX) 10 MG tablet TAKE 1/2 TABLET (5 MG TOTAL) BY MOUTH DAILY. 45 tablet 1   traZODone (DESYREL) 50 MG tablet Take 1 tablet (50 mg total) by mouth at bedtime. 30 tablet 1   No facility-administered medications prior to visit.   Review of Systems  Constitutional:  Negative for chills, fever, malaise/fatigue and weight loss.  HENT:  Negative for congestion, sinus pain and sore throat.   Eyes: Negative.   Respiratory:  Negative for cough, hemoptysis, sputum production, shortness of breath and wheezing.   Cardiovascular:  Negative for chest pain, palpitations, orthopnea, claudication and leg swelling.  Gastrointestinal:  Negative for abdominal pain, heartburn, nausea and vomiting.  Genitourinary: Negative.   Musculoskeletal:  Negative for joint pain and myalgias.  Skin:  Negative for rash.  Neurological:  Negative for weakness.  Endo/Heme/Allergies: Negative.   Psychiatric/Behavioral: Negative.      Objective:   There were no vitals filed for this visit.  Physical Exam Constitutional:      General: She is not in acute distress.    Appearance: She is obese. She is not ill-appearing.  HENT:     Head: Normocephalic and atraumatic.  Eyes:     General: No scleral icterus.    Conjunctiva/sclera: Conjunctivae normal.     Pupils: Pupils are equal, round, and reactive to light.  Cardiovascular:     Rate and Rhythm: Normal rate and regular rhythm.     Pulses: Normal pulses.     Heart sounds: Normal heart sounds. No murmur heard. Pulmonary:     Effort:  Pulmonary effort is normal.     Breath sounds: Normal breath sounds. No wheezing, rhonchi or rales.  Abdominal:     General: Bowel sounds are normal.     Palpations: Abdomen is soft.  Musculoskeletal:     Right lower leg: No edema.     Left lower leg: No edema.  Lymphadenopathy:     Cervical: No cervical adenopathy.  Skin:    General: Skin is warm and dry.  Neurological:     General: No focal deficit present.     Mental Status: She is alert.  Psychiatric:        Mood and Affect: Mood normal.        Behavior: Behavior normal.        Thought Content: Thought content normal.        Judgment: Judgment normal.    CBC    Component Value Date/Time   WBC 16.1 (H) 11/05/2021 0329   RBC 3.60 (L) 11/05/2021 0329   HGB 9.5 (L) 11/05/2021 0329   HGB 13.1 08/22/2021 1143   HCT 30.4 (L) 11/05/2021 0329   HCT 39.8 08/22/2021 1143   PLT 248 11/05/2021 0329   PLT 285 08/22/2021 1143   MCV 84.4 11/05/2021 0329   MCV 87 08/22/2021 1143   MCH 26.4 11/05/2021 0329   MCHC 31.3 11/05/2021 0329   RDW 20.0 (H) 11/05/2021 0329   RDW 14.9 08/22/2021 1143   LYMPHSABS 0.7 10/28/2021 0020   LYMPHSABS 2.1 08/22/2021 1143   MONOABS 0.1 10/28/2021 0020   EOSABS 0.1 10/28/2021 0020   EOSABS 0.1 08/22/2021 1143   BASOSABS 0.0 10/28/2021 0020   BASOSABS 0.0 08/22/2021 1143      Latest Ref Rng & Units 11/05/2021    3:29 AM 11/04/2021    3:36  AM 11/03/2021    3:38 AM  BMP  Glucose 70 - 99 mg/dL 114  123  106   BUN 6 - 20 mg/dL _0 Creatinine 0.44 - 1.00 mg/dL 0.62  0.58  0.57   Sodium 135 - 145 mmol/L 139  143  141   Potassium 3.5 - 5.1 mmol/L 4.0  4.1  3.8   Chloride 98 - 111 mmol/L 106  104  101   CO2 22 - 32 mmol/L 26  30  34   Calcium 8.9 - 10.3 mg/dL 8.6  8.6  8.5    Chest imaging: CTA Chest 10/29/21 1. Examination for pulmonary embolism is somewhat limited by breath motion artifact, particularly in the lung bases. Within this limitation, no evidence of pulmonary embolism through the segmental pulmonary arterial level. 2. Cardiomegaly. 3. Enlargement of the main pulmonary artery, as can be seen in pulmonary hypertension. 4. 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 Recommend dedicated CT angiogram of the abdomen and pelvis to further evaluate on a nonemergent basis, when clinically appropriate.  PFT:     No data to display          Labs:  Path:  Echo 11/01/21: LVEF 50-55%. Grade III diastolic dysfunction. RV systolic function is normal. LA size mildly dilated.   Heart Catheterization:  Sleep Study 01/01/22 - Severe obstructive sleep apnea with AHI 63.8 and SpO2 low of 68%  Assessment & Plan:   No diagnosis found.  Discussion: [name] [name] is a 61 year old woman, never smoker with history of asthma,  alcohol abuse, atrial fibrillation, hypertension and HFpEF who was admitted at [facility] 4/9 to 4/18 for asthma exacerbation and atrial fibrillation with RVR. She returns to pulmonary clinic for follow up.  She is to continue breo ellipta 274mg 1 puff daily for her asthma. She is to use albuterol inhaler as needed. If she is doing well without exacerbation or high use of albuterol we can decrease her breo dose at next visit.   We will check pulmonary function tests at next  visit.   Given her nocturnal hypoxemia, systolic heart failure, atrial fibrillation and obesity we will check an in lab sleep study for concern of sleep disordered breathing.   I have encouraged her to continue to limit her alcohol use due to effects on her heart failure and possible sleep apnea.   Follow up in 3-4 months.  JFreda Jackson MD [facility] Office: [phone]  Current Outpatient Medications:    albuterol (VENTOLIN HFA) 108 (90 Base) MCG/ACT inhaler, Inhale 2 puffs into the lungs every 6 (six) hours as needed for wheezing or shortness of breath., Disp: 8.5 g, Rfl: 1   atorvastatin (LIPITOR) 20 MG tablet, Take 1 tablet (20 mg total) by mouth at bedtime., Disp: 90 tablet, Rfl: 1   Blood Pressure Monitor KIT, 1 kit by Does not apply route 3 (three) times daily as needed., Disp: 1 kit, Rfl: 0   cholecalciferol (VITAMIN D3) 25 MCG (1000 UNIT) tablet, Take 1,000 Units by mouth daily., Disp: , Rfl:    ELIQUIS 5 MG TABS tablet, TAKE 1 TABLET (5 MG TOTAL) BY MOUTH 2 (TWO) TIMES DAILY., Disp: 180 tablet, Rfl: 1   FARXIGA 10 MG TABS tablet, TAKE 1 TABLET (10 MG TOTAL) BY MOUTH DAILY., Disp: 90 tablet, Rfl: 1   metoprolol succinate (TOPROL-XL) 50 MG 24 hr tablet, Take 1 tablet (50 mg total) by mouth daily. Take with or immediately following a meal., Disp: 90 tablet, Rfl: 1   OXYGEN, Inhale 2 L/min into the lungs continuous., Disp: , Rfl:    pantoprazole (PROTONIX) 40 MG tablet, TAKE 1 TABLET (40 MG TOTAL) BY MOUTH DAILY FOR ACID REFLUX, Disp: 90 tablet, Rfl: 1   SYMBICORT 160-4.5 MCG/ACT inhaler, INHALE 2 PUFFS INTO THE LUNGS 2 (TWO) TIMES DAILY., Disp: 10.2 g, Rfl: 3   thiamine 100 MG tablet, Take 1 tablet (100 mg total) by mouth daily., Disp: 30 tablet, Rfl: 0   torsemide (DEMADEX) 10 MG tablet, TAKE 1/2 TABLET (5 MG TOTAL) BY MOUTH DAILY., Disp: 45 tablet, Rfl: 1   traZODone (DESYREL) 50 MG tablet, Take 1 tablet (50 mg total) by mouth at bedtime., Disp: 30  tablet, Rfl: 1

## 2022-03-26 ENCOUNTER — Other Ambulatory Visit: Payer: Self-pay | Admitting: Primary Care

## 2022-03-31 ENCOUNTER — Encounter (HOSPITAL_COMMUNITY): Payer: Self-pay

## 2022-03-31 ENCOUNTER — Emergency Department (HOSPITAL_COMMUNITY): Payer: Medicare Other

## 2022-03-31 ENCOUNTER — Inpatient Hospital Stay (HOSPITAL_COMMUNITY)
Admission: EM | Admit: 2022-03-31 | Discharge: 2022-04-03 | DRG: 291 | Disposition: A | Discharge status: Home Health | Payer: Medicare Other | Attending: Internal Medicine | Admitting: Internal Medicine

## 2022-03-31 DIAGNOSIS — G8929 Other chronic pain: Secondary | ICD-10-CM | POA: Diagnosis present

## 2022-03-31 DIAGNOSIS — E785 Hyperlipidemia, unspecified: Secondary | ICD-10-CM | POA: Diagnosis present

## 2022-03-31 DIAGNOSIS — K219 Gastro-esophageal reflux disease without esophagitis: Secondary | ICD-10-CM | POA: Diagnosis present

## 2022-03-31 DIAGNOSIS — Z7901 Long term (current) use of anticoagulants: Secondary | ICD-10-CM

## 2022-03-31 DIAGNOSIS — I110 Hypertensive heart disease with heart failure: Principal | ICD-10-CM | POA: Diagnosis present

## 2022-03-31 DIAGNOSIS — J9621 Acute and chronic respiratory failure with hypoxia: Secondary | ICD-10-CM | POA: Diagnosis not present

## 2022-03-31 DIAGNOSIS — F419 Anxiety disorder, unspecified: Secondary | ICD-10-CM | POA: Diagnosis present

## 2022-03-31 DIAGNOSIS — Z881 Allergy status to other antibiotic agents status: Secondary | ICD-10-CM

## 2022-03-31 DIAGNOSIS — J441 Chronic obstructive pulmonary disease with (acute) exacerbation: Secondary | ICD-10-CM | POA: Diagnosis present

## 2022-03-31 DIAGNOSIS — Z800 Family history of malignant neoplasm of digestive organs: Secondary | ICD-10-CM

## 2022-03-31 DIAGNOSIS — Z8249 Family history of ischemic heart disease and other diseases of the circulatory system: Secondary | ICD-10-CM

## 2022-03-31 DIAGNOSIS — R0602 Shortness of breath: Secondary | ICD-10-CM | POA: Diagnosis not present

## 2022-03-31 DIAGNOSIS — Z7984 Long term (current) use of oral hypoglycemic drugs: Secondary | ICD-10-CM

## 2022-03-31 DIAGNOSIS — J9622 Acute and chronic respiratory failure with hypercapnia: Secondary | ICD-10-CM | POA: Diagnosis present

## 2022-03-31 DIAGNOSIS — Z6841 Body Mass Index (BMI) 40.0 and over, adult: Secondary | ICD-10-CM

## 2022-03-31 DIAGNOSIS — Z7951 Long term (current) use of inhaled steroids: Secondary | ICD-10-CM

## 2022-03-31 DIAGNOSIS — Z79899 Other long term (current) drug therapy: Secondary | ICD-10-CM

## 2022-03-31 DIAGNOSIS — J4520 Mild intermittent asthma, uncomplicated: Secondary | ICD-10-CM | POA: Diagnosis present

## 2022-03-31 DIAGNOSIS — Z888 Allergy status to other drugs, medicaments and biological substances status: Secondary | ICD-10-CM

## 2022-03-31 DIAGNOSIS — E662 Morbid (severe) obesity with alveolar hypoventilation: Secondary | ICD-10-CM | POA: Diagnosis present

## 2022-03-31 DIAGNOSIS — I480 Paroxysmal atrial fibrillation: Secondary | ICD-10-CM | POA: Diagnosis present

## 2022-03-31 DIAGNOSIS — F1010 Alcohol abuse, uncomplicated: Secondary | ICD-10-CM | POA: Diagnosis present

## 2022-03-31 DIAGNOSIS — Z9981 Dependence on supplemental oxygen: Secondary | ICD-10-CM

## 2022-03-31 DIAGNOSIS — I5033 Acute on chronic diastolic (congestive) heart failure: Secondary | ICD-10-CM | POA: Diagnosis present

## 2022-03-31 DIAGNOSIS — E876 Hypokalemia: Secondary | ICD-10-CM | POA: Diagnosis present

## 2022-03-31 LAB — CBC WITH DIFFERENTIAL/PLATELET
Abs Immature Granulocytes: 0.07 10*3/uL (ref 0.00–0.07)
Basophils Absolute: 0 10*3/uL (ref 0.0–0.1)
Basophils Relative: 0 %
Eosinophils Absolute: 0 10*3/uL (ref 0.0–0.5)
Eosinophils Relative: 0 %
HCT: 30.9 % — ABNORMAL LOW (ref 36.0–46.0)
Hemoglobin: 9.6 g/dL — ABNORMAL LOW (ref 12.0–15.0)
Immature Granulocytes: 1 %
Lymphocytes Relative: 14 %
Lymphs Abs: 1.5 10*3/uL (ref 0.7–4.0)
MCH: 27.2 pg (ref 26.0–34.0)
MCHC: 31.1 g/dL (ref 30.0–36.0)
MCV: 87.5 fL (ref 80.0–100.0)
Monocytes Absolute: 0.9 10*3/uL (ref 0.1–1.0)
Monocytes Relative: 8 %
Neutro Abs: 8.5 10*3/uL — ABNORMAL HIGH (ref 1.7–7.7)
Neutrophils Relative %: 77 %
Platelets: 234 10*3/uL (ref 150–400)
RBC: 3.53 MIL/uL — ABNORMAL LOW (ref 3.87–5.11)
RDW: 19.6 % — ABNORMAL HIGH (ref 11.5–15.5)
WBC: 11 10*3/uL — ABNORMAL HIGH (ref 4.0–10.5)
nRBC: 0 % (ref 0.0–0.2)

## 2022-03-31 LAB — LACTIC ACID, PLASMA
Lactic Acid, Venous: 1.1 mmol/L (ref 0.5–1.9)
Lactic Acid, Venous: 1.5 mmol/L (ref 0.5–1.9)

## 2022-03-31 LAB — COMPREHENSIVE METABOLIC PANEL
ALT: 24 U/L (ref 0–44)
AST: 24 U/L (ref 15–41)
Albumin: 3.3 g/dL — ABNORMAL LOW (ref 3.5–5.0)
Alkaline Phosphatase: 98 U/L (ref 38–126)
Anion gap: 10 (ref 5–15)
BUN: 8 mg/dL (ref 8–23)
CO2: 35 mmol/L — ABNORMAL HIGH (ref 22–32)
Calcium: 8.8 mg/dL — ABNORMAL LOW (ref 8.9–10.3)
Chloride: 93 mmol/L — ABNORMAL LOW (ref 98–111)
Creatinine, Ser: 0.55 mg/dL (ref 0.44–1.00)
GFR, Estimated: >60 mL/min (ref >60)
Glucose, Bld: 88 mg/dL (ref 70–99)
Potassium: 3.3 mmol/L — ABNORMAL LOW (ref 3.5–5.1)
Sodium: 138 mmol/L (ref 135–145)
Total Bilirubin: 1.1 mg/dL (ref 0.3–1.2)
Total Protein: 7 g/dL (ref 6.5–8.1)

## 2022-03-31 LAB — BLOOD GAS, VENOUS
Acid-Base Excess: 11.5 mmol/L — ABNORMAL HIGH (ref 0.0–2.0)
Bicarbonate: 39 mmol/L — ABNORMAL HIGH (ref 20.0–28.0)
O2 Saturation: 97.7 %
Patient temperature: 37
pCO2, Ven: 63 mmHg — ABNORMAL HIGH (ref 44–60)
pH, Ven: 7.4 (ref 7.25–7.43)
pO2, Ven: 84 mmHg — ABNORMAL HIGH (ref 32–45)

## 2022-03-31 LAB — BRAIN NATRIURETIC PEPTIDE: B Natriuretic Peptide: 388.1 pg/mL — ABNORMAL HIGH (ref 0.0–100.0)

## 2022-03-31 MED ORDER — IPRATROPIUM BROMIDE 0.02 % IN SOLN
0.5000 mg | Freq: Four times a day (QID) | RESPIRATORY_TRACT | Status: DC
  Administered 2022-03-31: 0.5 mg via RESPIRATORY_TRACT
  Filled 2022-03-31: qty 2.5

## 2022-03-31 MED ORDER — FUROSEMIDE 10 MG/ML IJ SOLN
60.0000 mg | Freq: Once | INTRAMUSCULAR | Status: AC
  Administered 2022-03-31: 60 mg via INTRAVENOUS
  Filled 2022-03-31: qty 8

## 2022-03-31 MED ORDER — DAPAGLIFLOZIN PROPANEDIOL 10 MG PO TABS
10.0000 mg | ORAL_TABLET | Freq: Every day | ORAL | Status: DC
  Administered 2022-04-01 – 2022-04-03 (×3): 10 mg via ORAL
  Filled 2022-03-31 (×4): qty 1

## 2022-03-31 MED ORDER — ACETAMINOPHEN 500 MG PO TABS
1000.0000 mg | ORAL_TABLET | Freq: Four times a day (QID) | ORAL | Status: DC | PRN
  Administered 2022-03-31 – 2022-04-01 (×3): 1000 mg via ORAL
  Filled 2022-03-31 (×3): qty 2

## 2022-03-31 MED ORDER — MOMETASONE FURO-FORMOTEROL FUM 200-5 MCG/ACT IN AERO
2.0000 | INHALATION_SPRAY | Freq: Two times a day (BID) | RESPIRATORY_TRACT | Status: DC
  Administered 2022-03-31 – 2022-04-03 (×6): 2 via RESPIRATORY_TRACT
  Filled 2022-03-31: qty 8.8

## 2022-03-31 MED ORDER — GUAIFENESIN ER 600 MG PO TB12
600.0000 mg | ORAL_TABLET | Freq: Two times a day (BID) | ORAL | Status: DC
  Administered 2022-03-31 – 2022-04-03 (×7): 600 mg via ORAL
  Filled 2022-03-31 (×7): qty 1

## 2022-03-31 MED ORDER — POTASSIUM CHLORIDE 20 MEQ PO PACK: 40.0000 meq | PACK | Freq: Two times a day (BID) | ORAL | Status: DC

## 2022-03-31 MED ORDER — METHYLPREDNISOLONE SODIUM SUCC 125 MG IJ SOLR
60.0000 mg | Freq: Once | INTRAMUSCULAR | Status: AC
  Administered 2022-03-31: 60 mg via INTRAVENOUS
  Filled 2022-03-31: qty 2

## 2022-03-31 MED ORDER — PREDNISONE 20 MG PO TABS
40.0000 mg | ORAL_TABLET | Freq: Every day | ORAL | Status: DC
  Administered 2022-04-01 – 2022-04-03 (×3): 40 mg via ORAL
  Filled 2022-03-31 (×3): qty 2

## 2022-03-31 MED ORDER — FUROSEMIDE 10 MG/ML IJ SOLN
40.0000 mg | Freq: Two times a day (BID) | INTRAMUSCULAR | Status: DC
  Administered 2022-03-31 – 2022-04-01 (×2): 40 mg via INTRAVENOUS
  Filled 2022-03-31 (×2): qty 4

## 2022-03-31 MED ORDER — MORPHINE SULFATE (PF) 2 MG/ML IV SOLN: 2.0000 mg | INTRAVENOUS | Status: DC | PRN

## 2022-03-31 MED ORDER — ATORVASTATIN CALCIUM 20 MG PO TABS
20.0000 mg | ORAL_TABLET | Freq: Every day | ORAL | Status: DC
  Administered 2022-03-31 – 2022-04-02 (×3): 20 mg via ORAL
  Filled 2022-03-31 (×2): qty 2
  Filled 2022-03-31: qty 1

## 2022-03-31 MED ORDER — PHENOL 1.4 % MT LIQD
1.0000 | OROMUCOSAL | Status: DC | PRN
  Filled 2022-03-31: qty 177

## 2022-03-31 MED ORDER — POLYETHYLENE GLYCOL 3350 17 G PO PACK: 17.0000 g | PACK | Freq: Every day | ORAL | Status: DC | PRN

## 2022-03-31 MED ORDER — POLYETHYLENE GLYCOL 3350 17 G PO PACK
17.0000 g | PACK | Freq: Every day | ORAL | Status: DC
  Administered 2022-04-02 – 2022-04-03 (×2): 17 g via ORAL
  Filled 2022-03-31 (×2): qty 1

## 2022-03-31 MED ORDER — APIXABAN 5 MG PO TABS
5.0000 mg | ORAL_TABLET | Freq: Two times a day (BID) | ORAL | Status: DC
  Administered 2022-03-31 – 2022-04-03 (×6): 5 mg via ORAL
  Filled 2022-03-31 (×6): qty 1

## 2022-03-31 MED ORDER — METOPROLOL SUCCINATE ER 50 MG PO TB24
50.0000 mg | ORAL_TABLET | Freq: Every day | ORAL | Status: DC
  Administered 2022-03-31 – 2022-04-03 (×4): 50 mg via ORAL
  Filled 2022-03-31 (×4): qty 1

## 2022-03-31 MED ORDER — PANTOPRAZOLE SODIUM 40 MG PO TBEC
40.0000 mg | DELAYED_RELEASE_TABLET | Freq: Every day | ORAL | Status: DC
  Administered 2022-03-31 – 2022-04-03 (×4): 40 mg via ORAL
  Filled 2022-03-31 (×4): qty 1

## 2022-03-31 MED ORDER — VITAMIN D 25 MCG (1000 UNIT) PO TABS
1000.0000 [IU] | ORAL_TABLET | Freq: Every day | ORAL | Status: DC
  Administered 2022-03-31 – 2022-04-03 (×4): 1000 [IU] via ORAL
  Filled 2022-03-31 (×4): qty 1

## 2022-03-31 MED ORDER — POTASSIUM CHLORIDE 20 MEQ PO PACK
40.0000 meq | PACK | Freq: Once | ORAL | Status: AC
  Administered 2022-03-31: 40 meq via ORAL
  Filled 2022-03-31: qty 2

## 2022-03-31 MED ORDER — TRAZODONE HCL 50 MG PO TABS
50.0000 mg | ORAL_TABLET | Freq: Every day | ORAL | Status: DC
  Administered 2022-03-31 – 2022-04-02 (×3): 50 mg via ORAL
  Filled 2022-03-31 (×3): qty 1

## 2022-03-31 MED ORDER — ALBUTEROL SULFATE HFA 108 (90 BASE) MCG/ACT IN AERS: 2.0000 | INHALATION_SPRAY | RESPIRATORY_TRACT | Status: DC | PRN

## 2022-03-31 MED ORDER — IPRATROPIUM-ALBUTEROL 0.5-2.5 (3) MG/3ML IN SOLN
3.0000 mL | RESPIRATORY_TRACT | Status: AC
  Administered 2022-03-31 (×3): 3 mL via RESPIRATORY_TRACT
  Filled 2022-03-31 (×3): qty 3

## 2022-03-31 MED ORDER — THIAMINE HCL 100 MG PO TABS
100.0000 mg | ORAL_TABLET | Freq: Every day | ORAL | Status: DC
  Administered 2022-04-01 – 2022-04-03 (×3): 100 mg via ORAL
  Filled 2022-03-31 (×6): qty 1

## 2022-03-31 NOTE — H&P (Signed)
History and Physical    [name] [name] AOZ:[id number] DOB: 06/01/1961 DOA: 03/31/2022  PCP: Kerin Perna, NP   Patient coming from: Home  Chief Complaint: SOB  HPI: [name] [name] is a 61 y.o. female with medical history significant of COPD/asthma, chronic hypoxic respiratory failure on 2 L of oxygen at night, sleep apnea, paroxysmal A-fib on anticoagulation, diastolic CHF, hyperlipidemia who presented from home with complaints of progressive shortness of breath.  Patient started feeling short of breath since last Thursday and progressively got worse. She lives alone.She is a nonsmoker. She was using 2 L of oxygen at home at night only but she has been continuously requiring it.  Patient has been feeling very weak, exhausted, also coughing . Today she missed her appointment with her PCP.  She has been using her inhalers more frequently than normal.  She is short of breath when she lies flat. Patient seen and examined at the bedside this afternoon.  During my evaluation she was not in severe respiratory distress.  She was on 3 L oxygen per minute. No history of fever, chills, chest pain, nausea, vomiting, abdominal pain, dysuria, hematochezia, melena or loss of consciousness. Patient recently had a sleep study and was found to be positive for sleep apnea and was recommended to wear CPAP .  ED Course: Found to be in A-fib on presentation.  She was wheezing so she was given steroids.  Also found to be volume overloaded with chest x-ray with features of CHF.  Given a dose of Lasix 60 mg IV once.  Blood pressure remains on the higher side on presentation, she was tachypneic.  Lab work showed potassium of 3.3.  Review of Systems: As per HPI otherwise 10 point review of systems negative.    Past Medical History:  Diagnosis Date   Asthma    Bloody stool    CHF (congestive heart failure) (HCC)    Chronic diastolic heart failure (HCC)    Dental decay    Gastritis, Helicobacter pylori 96/29/5284    Hypertension    Paroxysmal atrial fibrillation ([place])    Pneumonia 06/21/2015   Prepyloric ulcer 06/22/2015   Seasonal allergies    Shortness of breath dyspnea    with exertion    Past Surgical History:  Procedure Laterality Date   BARTHOLIN CYST MARSUPIALIZATION Left 1997   BIOPSY  11/04/2021   Procedure: BIOPSY;  Surgeon: Ronnette Juniper, MD;  Location: [facility];  Service: Gastroenterology;;   BUBBLE STUDY  10/31/2021   Procedure: BUBBLE STUDY;  Surgeon: Rex Kras, DO;  Location: [place];  Service: Cardiovascular;;   CARDIAC SURGERY N/A 1963   Repair of PFO   CARDIOVERSION N/A 10/31/2021   Procedure: CARDIOVERSION;  Surgeon: Rex Kras, DO;  Location: [place];  Service: Cardiovascular;  Laterality: N/A;   COLONOSCOPY N/A 2002   Performed secondary to mother's diagnosis of colon CA at age 72   COLONOSCOPY WITH PROPOFOL N/A 11/04/2021   Procedure: COLONOSCOPY WITH PROPOFOL;  Surgeon: Ronnette Juniper, MD;  Location: [facility];  Service: Gastroenterology;  Laterality: N/A;   ESOPHAGOGASTRODUODENOSCOPY (EGD) WITH PROPOFOL N/A 06/22/2015   Procedure: ESOPHAGOGASTRODUODENOSCOPY (EGD) WITH PROPOFOL;  Surgeon: Arta Silence, MD;  Location[facility];  Service: Endoscopy;  Laterality: N/A;   ESOPHAGOGASTRODUODENOSCOPY (EGD) WITH PROPOFOL N/A 11/04/2021   Procedure: ESOPHAGOGASTRODUODENOSCOPY (EGD) WITH PROPOFOL;  Surgeon: Ronnette Juniper, MD;  Location: [facility];  Service: Gastroenterology;  Laterality: N/A;   FRACTURE SURGERY     NASAL SINUS SURGERY Bilateral 1984  OVARIAN CYST REMOVAL Left 1990   POLYPECTOMY  11/04/2021   Procedure: POLYPECTOMY;  Surgeon: Ronnette Juniper, MD;  Location: [facility];  Service: Gastroenterology;;   TEE WITHOUT CARDIOVERSION N/A 10/31/2021   Procedure: TRANSESOPHAGEAL ECHOCARDIOGRAM (TEE);  Surgeon: Rex Kras, DO;  Location: [facility];  Service: Cardiovascular;  Laterality: N/A;   TUBAL LIGATION     UMBILICAL HERNIA REPAIR N/A 2005      reports that she has never smoked. She has never used smokeless tobacco. She reports that she does not currently use alcohol. She reports that she does not use drugs.  Allergies  Allergen Reactions   Sodium Ferric Gluconate [Ferrous Gluconate] Other (See Comments)    Patient had near syncope with abdominal pain and diaphoresis about an hour after IV ferric gluconate although it was while she was on commode having bowel movements   Tetracyclines & Related Itching    Family History  Problem Relation Age of Onset   Colon cancer Mother 55   Hypertension Mother    Hypertension Father    Cerebral aneurysm Sister    Goiter Sister    Breast cancer Neg Hx      Prior to Admission medications   Medication Sig Start Date End Date Taking? Authorizing Provider  albuterol (VENTOLIN HFA) 108 (90 Base) MCG/ACT inhaler Inhale 2 puffs into the lungs every 6 (six) hours as needed for wheezing or shortness of breath. 11/05/21   Mercy Riding, MD  atorvastatin (LIPITOR) 20 MG tablet Take 1 tablet (20 mg total) by mouth at bedtime. 11/20/21   Tolia, Sunit, DO  Blood Pressure Monitor KIT 1 kit by Does not apply route 3 (three) times daily as needed. 09/01/19   Kerin Perna, NP  cholecalciferol (VITAMIN D3) 25 MCG (1000 UNIT) tablet Take 1,000 Units by mouth daily.    [provider]  ELIQUIS 5 MG TABS tablet TAKE 1 TABLET (5 MG TOTAL) BY MOUTH 2 (TWO) TIMES DAILY. 03/14/22   Tolia, Sunit, DO  FARXIGA 10 MG TABS tablet TAKE 1 TABLET (10 MG TOTAL) BY MOUTH DAILY. 03/14/22   Tolia, Sunit, DO  metoprolol succinate (TOPROL-XL) 50 MG 24 hr tablet Take 1 tablet (50 mg total) by mouth daily. Take with or immediately following a meal. 11/20/21   Tolia, Sunit, DO  OXYGEN Inhale 2 L/min into the lungs continuous.    [provider]  pantoprazole (PROTONIX) 40 MG tablet TAKE 1 TABLET (40 MG TOTAL) BY MOUTH DAILY FOR ACID REFLUX 03/14/22   Tolia, Sunit, DO  SYMBICORT 160-4.5 MCG/ACT inhaler INHALE 2 PUFFS  INTO THE LUNGS 2 (TWO) TIMES DAILY. 02/12/22   Freddi Starr, MD  thiamine 100 MG tablet Take 1 tablet (100 mg total) by mouth daily. 03/26/21   Oswald Hillock, MD  torsemide (DEMADEX) 10 MG tablet TAKE 1/2 TABLET (5 MG TOTAL) BY MOUTH DAILY. 03/14/22   Tolia, Sunit, DO  traZODone (DESYREL) 50 MG tablet Take 1 tablet (50 mg total) by mouth at bedtime. 03/11/21   Mayers, Loraine Grip, PA-C    Physical Exam: Vitals:   03/31/22 1145 03/31/22 1147 03/31/22 1245 03/31/22 1345  BP: (!) 177/90  (!) 177/104 (!) 161/93  Pulse: (!) 106  (!) 109 (!) 109  Resp: (!) 24  20 (!) 27  Temp: 98.3 F (36.8 C)     TempSrc: Oral     SpO2: 100%  100% 100%  Weight:  106.6 kg    Height:  _0  (1.6 m)  Constitutional: NAD, calm, comfortable, morbidly obese Vitals:   03/31/22 1145 03/31/22 1147 03/31/22 1245 03/31/22 1345  BP: (!) 177/90  (!) 177/104 (!) 161/93  Pulse: (!) 106  (!) 109 (!) 109  Resp: (!) 24  20 (!) 27  Temp: 98.3 F (36.8 C)     TempSrc: Oral     SpO2: 100%  100% 100%  Weight:  106.6 kg    Height:  $Remove'5\' 3"'[id number]$  (1.6 m)     Eyes: PERRL, lids and conjunctivae normal ENMT: Mucous membranes are moist.  Neck: normal, supple, no masses, no thyromegaly Respiratory: Diminished air sounds bilaterally but no clear wheezing or crackles .Tachypnea, No accessory muscle use.  Cardiovascular: Irregularly irregular rhythm, no murmurs / rubs / gallops. Trace lower extremity edema.  Abdomen: no tenderness, no masses palpated. No hepatosplenomegaly. Bowel sounds positive.  Musculoskeletal: no clubbing / cyanosis. No joint deformity upper and lower extremities.  Skin: no rashes, lesions, ulcers. No induration Neurologic: CN 2-12 grossly intact.  Strength 5/5 in all 4.  Psychiatric: Normal judgment and insight. Alert and oriented x 3. Normal mood.   Foley Catheter:None  Labs on Admission: I have personally reviewed following labs and imaging studies  CBC: Recent Labs  Lab 03/31/22 1239  WBC 11.0*   NEUTROABS 8.5*  HGB 9.6*  HCT 30.9*  MCV 87.5  PLT 016   Basic Metabolic Panel: Recent Labs  Lab 03/31/22 1239  NA 138  K 3.3*  CL 93*  CO2 35*  GLUCOSE 88  BUN 8  CREATININE 0.55  CALCIUM 8.8*   GFR: Estimated Creatinine Clearance: 86.4 mL/min (by C-G formula based on SCr of 0.55 mg/dL). Liver Function Tests: Recent Labs  Lab 03/31/22 1239  AST 24  ALT 24  ALKPHOS 98  BILITOT 1.1  PROT 7.0  ALBUMIN 3.3*   No results for input(s): "LIPASE", "AMYLASE" in the last 168 hours. No results for input(s): "AMMONIA" in the last 168 hours. Coagulation Profile: No results for input(s): "INR", "PROTIME" in the last 168 hours. Cardiac Enzymes: No results for input(s): "CKTOTAL", "CKMB", "CKMBINDEX", "TROPONINI" in the last 168 hours. BNP (last 3 results) No results for input(s): "PROBNP" in the last 8760 hours. HbA1C: No results for input(s): "HGBA1C" in the last 72 hours. CBG: No results for input(s): "GLUCAP" in the last 168 hours. Lipid Profile: No results for input(s): "CHOL", "HDL", "LDLCALC", "TRIG", "CHOLHDL", "LDLDIRECT" in the last 72 hours. Thyroid Function Tests: No results for input(s): "TSH", "T4TOTAL", "FREET4", "T3FREE", "THYROIDAB" in the last 72 hours. Anemia Panel: No results for input(s): "VITAMINB12", "FOLATE", "FERRITIN", "TIBC", "IRON", "RETICCTPCT" in the last 72 hours. Urine analysis:    Component Value Date/Time   COLORURINE STRAW (A) 05/14/2021 1033   APPEARANCEUR CLEAR 05/14/2021 1033   LABSPEC 1.009 05/14/2021 1033   PHURINE 6.0 05/14/2021 [place] 05/14/2021 1033   HGBUR NEGATIVE 05/14/2021 1033   [place] 05/14/2021 [place] 05/14/2021 1033   PROTEINUR NEGATIVE 05/14/2021 1033   UROBILINOGEN 0.2 04/07/2011 1405   NITRITE NEGATIVE 05/14/2021 1033   LEUKOCYTESUR NEGATIVE 05/14/2021 1033    Radiological Exams on Admission: DG Chest 2 View  Result Date: 03/31/2022 CLINICAL DATA:   Shortness of breath.  Hypoxia. EXAM: CHEST - 2 VIEW COMPARISON:  10/27/2021 FINDINGS: There is mild cardiac enlargement. Pulmonary vascular congestion is new from the previous exam. There is blunting of the costophrenic angles concerning for small pleural effusions. No airspace consolidation. Visualized osseous structures are notable for thoracic  degenerative disc disease. IMPRESSION: Suspect congestive heart failure. Electronically Signed   By: Kerby Moors M.D.   On: 03/31/2022 12:31     Assessment/Plan Principal Problem:   Acute on chronic respiratory failure with hypoxia (HCC) Active Problems:   Paroxysmal atrial fibrillation (HCC)   Acute on chronic diastolic CHF (congestive heart failure) (HCC)   COPD exacerbation (HCC)  Acute on chronic respiratory failure with hypoxia and hypercarbia: On 2 L of oxygen at home at night only.  Multifactorial etiology including CHF, COPD  and also OSA / OHS. Patient requiring more oxygen and using throughout the day and night. We will wean the oxygen.  Acute COPD exacerbation/mild intermittent asthma: Presented with wheezing.  She is a non-smoker.  Given a dose of Solu-Medrol in the ED.  Started on oral steroid.  She follows with Dr. Erin Fulling.  On Breo Ellipta at home for asthma.  Acute on chronic diastolic CHF: Last echo was on 4/23 which showed EF of 50 to 76%, grade 3 diastolic dysfunction.  Follows with cardiology.  Takes torsemide at home.  Also on  farxiga. Found to be volume overloaded on presentation, will check BNP.  Chest x-ray showed features of CHF.  Given a dose of Lasix 60 mg once.  Continue Lasix 40 mg twice daily IV for now.  Monitor input/output, daily weight  Paroxysmal A-fib: On A-fib on presentation.  Usually in normal sinus rhythm.  Takes metoprolol for rate control and Eliquis for anticoagulation.  [facility].  Status post cardioversion for A-fib with RVR on 10/31/2021.  Monitor on telemetry.  Continue metoprolol and Eliquis for  now.  Heart rate in the range of low 100s.  If she does not slow down with metoprolol, might need to start on Cardizem drip.  Hopefully,she will convert to normal sinus rhythm with improvement in the respiratory status  Hyperlipidemia: Continue Lipitor  OSA: Recently diagnosed with sleep apnea after sleep study.  Does not  wan to use CPAP  History of iron-deficiency anemia/normocytic anemia: Currently hemoglobin stable.  Hypertension: Mildly hypertensive in the emergency department.  Continue metoprolol for now.  Hypokalemia: Supplemented with potassium  History of alcohol use: Significantly cut down, says  that she only drinks socially now  Morbid obesity: BMI of 41.6 .  Discussed the importance of healthy diet, regular physical activity, weight loss        Severity of Illness: The appropriate patient status for this patient is OBSERVATION.   DVT prophylaxis: Eliquis Code Status: Full Family Communication: None at bedside Consults called: None     Shelly Coss MD [facility]  03/31/2022, 2:04 PM

## 2022-03-31 NOTE — ED Provider Notes (Signed)
[facility] DEPT Provider Note   CSN: [id number] Arrival date & time: 03/31/22  1130     History  Chief Complaint  Patient presents with   Shortness of Breath    [name] [name] is a 60 y.o. female with  COPD, chronic hypoxic RF on 2 L at night, paroxysmal A-fib/PVCs on eliquis, diastolic CHF, alcohol abuse, anxiety, chronic pain, anemia, gastric ulcer, morbid obesity  presents with SOB.   H/o both asthma and COPD. Worsening SOB going on since 9/7 or 9/8. Called ambulance on 9/9, came and gave breathing treatment x 2 and patient felt improved so did not incline to be brought to ED. Has been wearing oxygen all day and all night. Had doctor's appointment today, feeling exhausted, coughing, couldn't go more than a few steps she felt so generally weak. Denies coughing anything up. Has been using inhalers more frequently than normal. +orthopnea. Denies fevers/chills, CP, nausea/vomiting, abdominal pain, leg swelling, recent surgery/hospitalization. Patient has h/o afib and is on blood thinners; was cardioverted during last admission with subsequent improved EF. Last year patient was started on oxygen at night; doesn't normally wear it during the day. Had sleep study last year and was told to wear CPAP but patient doesn't want to.    Shortness of Breath      Home Medications Prior to Admission medications   Medication Sig Start Date End Date Taking? Authorizing Provider  albuterol (VENTOLIN HFA) 108 (90 Base) MCG/ACT inhaler Inhale 2 puffs into the lungs every 6 (six) hours as needed for wheezing or shortness of breath. 11/05/21   Mercy Riding, MD  atorvastatin (LIPITOR) 20 MG tablet Take 1 tablet (20 mg total) by mouth at bedtime. 11/20/21   Tolia, Sunit, DO  azithromycin (ZITHROMAX) 250 MG tablet Take by mouth. 03/29/22   [provider]  Blood Pressure Monitor KIT 1 kit by Does not apply route 3 (three) times daily as needed. 09/01/19   Kerin Perna,  NP  BREO ELLIPTA 200-25 MCG/ACT AEPB Inhale 1 puff into the lungs daily. 03/26/22   [provider]  cholecalciferol (VITAMIN D3) 25 MCG (1000 UNIT) tablet Take 1,000 Units by mouth daily.    [provider]  ELIQUIS 5 MG TABS tablet TAKE 1 TABLET (5 MG TOTAL) BY MOUTH 2 (TWO) TIMES DAILY. 03/14/22   Tolia, Sunit, DO  FARXIGA 10 MG TABS tablet TAKE 1 TABLET (10 MG TOTAL) BY MOUTH DAILY. 03/14/22   Tolia, Sunit, DO  hydrochlorothiazide (HYDRODIURIL) 25 MG tablet Take 25 mg by mouth daily. 01/11/22   [provider]  metoprolol succinate (TOPROL-XL) 50 MG 24 hr tablet Take 1 tablet (50 mg total) by mouth daily. Take with or immediately following a meal. 11/20/21   Tolia, Sunit, DO  OXYGEN Inhale 2 L/min into the lungs continuous.    [provider]  pantoprazole (PROTONIX) 40 MG tablet TAKE 1 TABLET (40 MG TOTAL) BY MOUTH DAILY FOR ACID REFLUX 03/14/22   Tolia, Sunit, DO  SYMBICORT 160-4.5 MCG/ACT inhaler INHALE 2 PUFFS INTO THE LUNGS 2 (TWO) TIMES DAILY. 02/12/22   Freddi Starr, MD  thiamine 100 MG tablet Take 1 tablet (100 mg total) by mouth daily. 03/26/21   Oswald Hillock, MD  torsemide (DEMADEX) 10 MG tablet TAKE 1/2 TABLET (5 MG TOTAL) BY MOUTH DAILY. 03/14/22   Tolia, Sunit, DO  traZODone (DESYREL) 50 MG tablet Take 1 tablet (50 mg total) by mouth at bedtime. 03/11/21   Mayers, Johnette Abraham  S, PA-C      Allergies    Sodium ferric gluconate [ferrous gluconate] and Tetracyclines & related    Review of Systems   Review of Systems  Respiratory:  Positive for shortness of breath.   Review of systems negative for f/c.  A 10 point review of systems was performed and is negative unless otherwise reported in HPI.   Physical Exam Updated Vital Signs BP (!) 159/90 (BP Location: Left Arm)   Pulse (!) 118   Temp 98.5 F (36.9 C) (Oral)   Resp 20   Ht '5\' 3"'  (1.6 m)   Wt 106.6 kg   SpO2 100%   BMI 41.63 kg/m  Physical Exam General: Obese appearing female, lying in bed.   HEENT: PERRLA, Sclera anicteric, MMM, trachea midline. Cardiology: Irregularly irregular rhythm, no murmurs/rubs/gallops. BL radial and DP pulses equal bilaterally.  Resp: Increased respiratory effort and slightly tachypneic but not in respiratory distress. CTAB, no wheezes, rhonchi. Bibasilar crackles. Abd: Soft, non-tender, non-distended. No rebound tenderness or guarding.  GU: Deferred. MSK: Trace LE edema. No signs of trauma. Extremities without deformity or TTP. No cyanosis or clubbing. Skin: warm, dry. No rashes or lesions. Neuro: A&Ox4, CNs II-XII grossly intact. MAEs. Sensation grossly intact.  Psych: Normal mood and affect.   ED Results / Procedures / Treatments   Labs (all labs ordered are listed, but only abnormal results are displayed) Labs Reviewed  CBC WITH DIFFERENTIAL/PLATELET - Abnormal; Notable for the following components:      Result Value   WBC 11.0 (*)    RBC 3.53 (*)    Hemoglobin 9.6 (*)    HCT 30.9 (*)    RDW 19.6 (*)    Neutro Abs 8.5 (*)    All other components within normal limits  COMPREHENSIVE METABOLIC PANEL - Abnormal; Notable for the following components:   Potassium 3.3 (*)    Chloride 93 (*)    CO2 35 (*)    Calcium 8.8 (*)    Albumin 3.3 (*)    All other components within normal limits  BLOOD GAS, VENOUS - Abnormal; Notable for the following components:   pCO2, Ven 63 (*)    pO2, Ven 84 (*)    Bicarbonate 39.0 (*)    Acid-Base Excess 11.5 (*)    All other components within normal limits  BRAIN NATRIURETIC PEPTIDE - Abnormal; Notable for the following components:   B Natriuretic Peptide 388.1 (*)    All other components within normal limits  LACTIC ACID, PLASMA  LACTIC ACID, PLASMA    EKG EKG Interpretation  Date/Time:  Monday March 31 2022 11:49:05 EDT Ventricular Rate:  112 PR Interval:    QRS Duration: 109 QT Interval:  359 QTC Calculation: 445 R Axis:   87 Text Interpretation: Atrial fibrillation Multiple premature  complexes, vent & supraven LAE, consider biatrial enlargement Borderline right axis deviation Minimal ST depression, lateral leads Confirmed by Cindee Lame [phone]) on 03/31/2022 3:42:41 PM  Radiology DG Chest 2 View  Result Date: 03/31/2022 CLINICAL DATA:  Shortness of breath.  Hypoxia. EXAM: CHEST - 2 VIEW COMPARISON:  10/27/2021 FINDINGS: There is mild cardiac enlargement. Pulmonary vascular congestion is new from the previous exam. There is blunting of the costophrenic angles concerning for small pleural effusions. No airspace consolidation. Visualized osseous structures are notable for thoracic degenerative disc disease. IMPRESSION: Suspect congestive heart failure. Electronically Signed   By: Kerby Moors M.D.   On: 03/31/2022 12:31    Procedures .Critical Care  Performed by: Audley Hose, MD Authorized by: Audley Hose, MD   Critical care provider statement:    Critical care time (minutes):  30   Critical care was necessary to treat or prevent imminent or life-threatening deterioration of the following conditions:  Respiratory failure   Critical care was time spent personally by me on the following activities:  Development of treatment plan with patient or surrogate, discussions with consultants, evaluation of patient's response to treatment, examination of patient, ordering and review of laboratory studies, ordering and review of radiographic studies, ordering and performing treatments and interventions, pulse oximetry, re-evaluation of patient's condition, review of old charts and obtaining history from patient or surrogate   Care discussed with: admitting provider       Medications Ordered in ED Medications  acetaminophen (TYLENOL) tablet 1,000 mg (1,000 mg Oral Given 03/31/22 1404)  polyethylene glycol (MIRALAX / GLYCOLAX) packet 17 g (0 g Oral Duplicate 4/33/29 5188)  atorvastatin (LIPITOR) tablet 20 mg (has no administration in time range)  metoprolol succinate  (TOPROL-XL) 24 hr tablet 50 mg (has no administration in time range)  traZODone (DESYREL) tablet 50 mg (has no administration in time range)  dapagliflozin propanediol (FARXIGA) tablet 10 mg (has no administration in time range)  pantoprazole (PROTONIX) EC tablet 40 mg (has no administration in time range)  apixaban (ELIQUIS) tablet 5 mg (has no administration in time range)  thiamine (VITAMIN B1) tablet 100 mg (has no administration in time range)  cholecalciferol (VITAMIN D3) 25 MCG (1000 UNIT) tablet 1,000 Units (has no administration in time range)  mometasone-formoterol (DULERA) 200-5 MCG/ACT inhaler 2 puff (has no administration in time range)  morphine (PF) 2 MG/ML injection 2 mg (has no administration in time range)  polyethylene glycol (MIRALAX / GLYCOLAX) packet 17 g (has no administration in time range)  guaiFENesin (MUCINEX) 12 hr tablet 600 mg (has no administration in time range)  furosemide (LASIX) injection 40 mg (has no administration in time range)  ipratropium (ATROVENT) nebulizer solution 0.5 mg (0.5 mg Nebulization Not Given 03/31/22 1433)  predniSONE (DELTASONE) tablet 40 mg (has no administration in time range)  furosemide (LASIX) injection 60 mg (60 mg Intravenous Given 03/31/22 1354)  ipratropium-albuterol (DUONEB) 0.5-2.5 (3) MG/3ML nebulizer solution 3 mL (3 mLs Nebulization Given 03/31/22 1410)  methylPREDNISolone sodium succinate (SOLU-MEDROL) 125 mg/2 mL injection 60 mg (60 mg Intravenous Given 03/31/22 1354)  potassium chloride (KLOR-CON) packet 40 mEq (40 mEq Oral Given 03/31/22 1404)    ED Course/ Medical Decision Making/ A&P                          Medical Decision Making Amount and/or Complexity of Data Reviewed Labs: ordered. Radiology: ordered. Decision-making details documented in ED Course.  Risk Prescription drug management. Decision regarding hospitalization.   Patient with dyspnea and with new O2 requirement of 3L (supposed to be on 2L at night  but not during the day), but no acute respiratory distress. EKG/monitor demonstrate afib without signs of ischemia. Patient w/ h/o COPD/asthma and HF, consider COPD/asthma exacerbation vs HF exacerbation as possible etiologies of her dyspnea. Also consider pneumonia, pleural effusions, PTX. Will obtain VBG to evaluate for hypercarbic resp failure. Pt w/ no f/c, significant wheezing, lowering PNA and COPD exacerbation on the differential. Orthopnea points to CHF. Consider PE in this patient but she takes anticoagulation and has no CP, no asymmetric LEE, do not believe it is the most likely diagnosis at this  time. Patient maintaining sats on 3L, do not believe she requires NPPV at this time but will eval w/ VBG. Had been cardioverted at previous admission for similar dyspnea at the time but patient is mostly rate controlled, however could consider Afib as a possible contributor to HF symptoms.   Labs demonstrate pCO2 63, bicarb 39, K 3.3, CO2 35, WBC 11.0, Hgb 9.6, pro-BNP 388. Hypercarbic but not drastically so, CXR demonstrates cardiac enlargement and pulmonary vascular congestion w/ small BL pleural effusions pointing to congestive heart failure. Patient is given lasix 60 mg IV, 3 duonebs, 60 mg methylprednisolone IV to treat for CHF and/or COPD exacerbation. Consulted to and admitted to medicine for further w/u and management.  I have personally reviewed and interpreted all labs and imaging.   Clinical Course as of 03/31/22 1545  Mon Mar 31, 2022  1332 DG Chest 2 View FINDINGS: There is mild cardiac enlargement. Pulmonary vascular congestion is new from the previous exam. There is blunting of the costophrenic angles concerning for small pleural effusions. No airspace consolidation. Visualized osseous structures are notable for thoracic degenerative disc disease.  IMPRESSION: Suspect congestive heart failure.   [HN]    Clinical Course User Index [HN] Audley Hose, MD    Dispo:  admit         Final Clinical Impression(s) / ED Diagnoses Final diagnoses:  Acute on chronic diastolic congestive heart failure ([place])    Rx / DC Orders ED Discharge Orders     None        This note was created using dictation software, which may contain spelling or grammatical errors.    Audley Hose, MD 04/04/22 [phone]

## 2022-03-31 NOTE — Progress Notes (Signed)
   03/31/22 1506  Assess: MEWS Score  Temp 98.5 F (36.9 C)  BP (!) 159/90  MAP (mmHg) 107  Pulse Rate (!) 118  Resp 20  Level of Consciousness Alert  SpO2 100 %  O2 Device Nasal Cannula  O2 Flow Rate (L/min) 3 L/min  Assess: MEWS Score  MEWS Temp 0  MEWS Systolic 0  MEWS Pulse 2  MEWS RR 0  MEWS LOC 0  MEWS Score 2  MEWS Score Color Yellow  Assess: if the MEWS score is Yellow or Red  Were vital signs taken at a resting state? Yes  Focused Assessment Change from prior assessment (see assessment flowsheet)  Does the patient meet 2 or more of the SIRS criteria? Yes  Does the patient have a confirmed or suspected source of infection? Yes  Provider and Rapid Response Notified?  (MD notified)  MEWS guidelines implemented *See Row Information* Yes  Treat  MEWS Interventions Escalated (See documentation below)  Pain Scale 0-10  Pain Score 0  Take Vital Signs  Increase Vital Sign Frequency  Yellow: Q 2hr X 2 then Q 4hr X 2, if remains yellow, continue Q 4hrs  Escalate  MEWS: Escalate Yellow: discuss with charge nurse/RN and consider discussing with provider and RRT  Notify: Charge Nurse/RN  Name of Charge Nurse/RN Notified Brien Mates, RN  Date Charge Nurse/RN Notified 03/31/22  Time Charge Nurse/RN Notified 1527  Notify: Provider  Provider Name/Title Shelly Coss, MD  Date Provider Notified 03/31/22  Time Provider Notified 1527  Method of Notification  (secure chat)  Notification Reason Change in status  Provider response No new orders  Date of Provider Response 03/31/22  Time of Provider Response 1528  Assess: SIRS CRITERIA  SIRS Temperature  0  SIRS Pulse 1  SIRS Respirations  0  SIRS WBC 1  SIRS Score Sum  2

## 2022-03-31 NOTE — ED Triage Notes (Signed)
Pt sent by PCP for hypoxia 88% RA/acute asthma exacerbation. Per PCP tested negative for covid 9/7, on azythrimycin and continuous o2 since 9/8. Has been using rescue inhalers with no relief. EMS gave 2 breathing tx on weekend. PCP gave 1 treatment. Exertional SHOB. 20ga LAC.  ST 110 hx afib 100 3L 

## 2022-04-01 ENCOUNTER — Other Ambulatory Visit: Payer: Self-pay

## 2022-04-01 DIAGNOSIS — Z6841 Body Mass Index (BMI) 40.0 and over, adult: Secondary | ICD-10-CM | POA: Diagnosis not present

## 2022-04-01 DIAGNOSIS — R0602 Shortness of breath: Secondary | ICD-10-CM | POA: Diagnosis present

## 2022-04-01 DIAGNOSIS — J9622 Acute and chronic respiratory failure with hypercapnia: Secondary | ICD-10-CM | POA: Diagnosis present

## 2022-04-01 DIAGNOSIS — F1010 Alcohol abuse, uncomplicated: Secondary | ICD-10-CM | POA: Diagnosis present

## 2022-04-01 DIAGNOSIS — Z79899 Other long term (current) drug therapy: Secondary | ICD-10-CM | POA: Diagnosis not present

## 2022-04-01 DIAGNOSIS — J441 Chronic obstructive pulmonary disease with (acute) exacerbation: Secondary | ICD-10-CM | POA: Diagnosis present

## 2022-04-01 DIAGNOSIS — J4520 Mild intermittent asthma, uncomplicated: Secondary | ICD-10-CM | POA: Diagnosis present

## 2022-04-01 DIAGNOSIS — E785 Hyperlipidemia, unspecified: Secondary | ICD-10-CM | POA: Diagnosis present

## 2022-04-01 DIAGNOSIS — Z7951 Long term (current) use of inhaled steroids: Secondary | ICD-10-CM | POA: Diagnosis not present

## 2022-04-01 DIAGNOSIS — Z7901 Long term (current) use of anticoagulants: Secondary | ICD-10-CM | POA: Diagnosis not present

## 2022-04-01 DIAGNOSIS — K219 Gastro-esophageal reflux disease without esophagitis: Secondary | ICD-10-CM | POA: Diagnosis present

## 2022-04-01 DIAGNOSIS — Z800 Family history of malignant neoplasm of digestive organs: Secondary | ICD-10-CM | POA: Diagnosis not present

## 2022-04-01 DIAGNOSIS — Z8249 Family history of ischemic heart disease and other diseases of the circulatory system: Secondary | ICD-10-CM | POA: Diagnosis not present

## 2022-04-01 DIAGNOSIS — G8929 Other chronic pain: Secondary | ICD-10-CM | POA: Diagnosis present

## 2022-04-01 DIAGNOSIS — J9621 Acute and chronic respiratory failure with hypoxia: Secondary | ICD-10-CM | POA: Diagnosis present

## 2022-04-01 DIAGNOSIS — Z881 Allergy status to other antibiotic agents status: Secondary | ICD-10-CM | POA: Diagnosis not present

## 2022-04-01 DIAGNOSIS — Z7984 Long term (current) use of oral hypoglycemic drugs: Secondary | ICD-10-CM | POA: Diagnosis not present

## 2022-04-01 DIAGNOSIS — I5033 Acute on chronic diastolic (congestive) heart failure: Secondary | ICD-10-CM | POA: Diagnosis present

## 2022-04-01 DIAGNOSIS — I110 Hypertensive heart disease with heart failure: Secondary | ICD-10-CM | POA: Diagnosis present

## 2022-04-01 DIAGNOSIS — F419 Anxiety disorder, unspecified: Secondary | ICD-10-CM | POA: Diagnosis present

## 2022-04-01 DIAGNOSIS — E662 Morbid (severe) obesity with alveolar hypoventilation: Secondary | ICD-10-CM | POA: Diagnosis present

## 2022-04-01 DIAGNOSIS — Z888 Allergy status to other drugs, medicaments and biological substances status: Secondary | ICD-10-CM | POA: Diagnosis not present

## 2022-04-01 DIAGNOSIS — I480 Paroxysmal atrial fibrillation: Secondary | ICD-10-CM | POA: Diagnosis present

## 2022-04-01 DIAGNOSIS — E876 Hypokalemia: Secondary | ICD-10-CM | POA: Diagnosis present

## 2022-04-01 DIAGNOSIS — Z9981 Dependence on supplemental oxygen: Secondary | ICD-10-CM | POA: Diagnosis not present

## 2022-04-01 LAB — BASIC METABOLIC PANEL
Anion gap: 11 (ref 5–15)
BUN: 13 mg/dL (ref 8–23)
CO2: 33 mmol/L — ABNORMAL HIGH (ref 22–32)
Calcium: 9.5 mg/dL (ref 8.9–10.3)
Chloride: 95 mmol/L — ABNORMAL LOW (ref 98–111)
Creatinine, Ser: 0.59 mg/dL (ref 0.44–1.00)
GFR, Estimated: >60 mL/min (ref >60)
Glucose, Bld: 158 mg/dL — ABNORMAL HIGH (ref 70–99)
Potassium: 4 mmol/L (ref 3.5–5.1)
Sodium: 139 mmol/L (ref 135–145)

## 2022-04-01 LAB — CBC
HCT: 32.6 % — ABNORMAL LOW (ref 36.0–46.0)
Hemoglobin: 10.2 g/dL — ABNORMAL LOW (ref 12.0–15.0)
MCH: 27.4 pg (ref 26.0–34.0)
MCHC: 31.3 g/dL (ref 30.0–36.0)
MCV: 87.6 fL (ref 80.0–100.0)
Platelets: 271 10*3/uL (ref 150–400)
RBC: 3.72 MIL/uL — ABNORMAL LOW (ref 3.87–5.11)
RDW: 18.6 % — ABNORMAL HIGH (ref 11.5–15.5)
WBC: 7.8 10*3/uL (ref 4.0–10.5)
nRBC: 0 % (ref 0.0–0.2)

## 2022-04-01 MED ORDER — IPRATROPIUM-ALBUTEROL 0.5-2.5 (3) MG/3ML IN SOLN
3.0000 mL | Freq: Four times a day (QID) | RESPIRATORY_TRACT | Status: DC
  Administered 2022-04-01 – 2022-04-03 (×10): 3 mL via RESPIRATORY_TRACT
  Filled 2022-04-01 (×10): qty 3

## 2022-04-01 MED ORDER — FUROSEMIDE 10 MG/ML IJ SOLN
40.0000 mg | Freq: Two times a day (BID) | INTRAMUSCULAR | Status: DC
  Administered 2022-04-01 – 2022-04-03 (×4): 40 mg via INTRAVENOUS
  Filled 2022-04-01 (×4): qty 4

## 2022-04-01 NOTE — Progress Notes (Signed)
Mobility Specialist Cancellation/Refusal Note:    04/01/22 1032  Mobility  Activity Refused mobility     Reason for Cancellation/Refusal: Pt declined mobility at this time. Pt sleeping at this time, will return after lunch.  Will check back as schedule permits.       [facility]

## 2022-04-01 NOTE — Progress Notes (Signed)
PROGRESS NOTE  [name]  ZDG:[id number] DOB: 1960/12/20 DOA: 03/31/2022 PCP: Kerin Perna, NP   Brief Narrative: [name] [name] is a 61 y.o. female with medical history significant of COPD/asthma, chronic hypoxic respiratory failure on 2 L of oxygen at night, sleep apnea, paroxysmal A-fib on anticoagulation, diastolic CHF, hyperlipidemia who presented from home with complaints of progressive shortness of breath.  She was using 2 L of oxygen at home at night only but she has been continuously requiring it.  Patient has been feeling very weak, exhausted, also coughing .Found to be in A-fib on presentation.  She was wheezing so she was given steroids.  Also found to be volume overloaded with chest x-ray with features of CHF.  Given a dose of Lasix 60 mg IV once.  Patient was admitted for the management of acute on chronic hypoxic/hypercarbic respiratory failure secondary to COPD/CHF exacerbation  Assessment & Plan:  Principal Problem:   Acute on chronic respiratory failure with hypoxia (HCC) Active Problems:   Paroxysmal atrial fibrillation (HCC)   Acute on chronic diastolic CHF (congestive heart failure) (HCC)   COPD exacerbation (HCC)   Acute on chronic respiratory failure with hypoxia and hypercapnia (HCC)   Acute on chronic respiratory failure with hypoxia and hypercarbia: On 2 L of oxygen at home at night only.  Multifactorial etiology including CHF, COPD  and also OSA / OHS. Patient requiring more oxygen and using throughout the day and night.  Respiratory status has improved. We will wean the oxygen.   Acute COPD exacerbation/mild intermittent asthma: Presented with wheezing.  She is a non-smoker.  Given a dose of Solu-Medrol in the ED.  Started on oral steroid.  She follows with Dr. Erin Fulling.  On Breo Ellipta at home for asthma.   Acute on chronic diastolic CHF: Last echo was on 4/23 which showed EF of 50 to 95%, grade 3 diastolic dysfunction.  Follows with cardiology.  Takes  torsemide at home.  Also on  farxiga. Found to be volume overloaded on presentation,elevated BNP.  Chest x-ray showed features of CHF.  Given a dose of Lasix 60 mg once.  Continue Lasix 40 mg twice daily IV for now.  Monitor input/output, daily weight.  Can be transitioned to oral Lasix or torsemide tomorrow depending upon volume status   Paroxysmal A-fib: On A-fib on presentation.  Usually in normal sinus rhythm.  Takes metoprolol for rate control and Eliquis for anticoagulation.  [facility].  Status post cardioversion for A-fib with RVR on 10/31/2021.  Monitor on telemetry.  Continue metoprolol and Eliquis for now.  This morning her heart rate is well controlled in 70-80s   Hyperlipidemia: Continue Lipitor   OSA: Recently diagnosed with sleep apnea after sleep study.  Does not  wan to use CPAP   History of iron-deficiency anemia/normocytic anemia: Currently hemoglobin stable.   Hypertension: Mildly hypertensive in the emergency department.  Continue metoprolol for now.   Hypokalemia: Supplemented with potassium   History of alcohol use: Significantly cut down, says  that she only drinks socially now   Morbid obesity: BMI of 41.6 .  Discussed the importance of healthy diet, regular physical activity, weight loss  Weakness/deconditioning: PT/OT consulted            DVT prophylaxis: apixaban (ELIQUIS) tablet 5 mg     Code Status: Full Code  Family Communication: None at bedside  Patient status:Inpatient  Patient is from :Home  Anticipated discharge JO:ACZY  Estimated DC date:1-2 days  Consultants: None  Procedures:None  Antimicrobials:  Anti-infectives (From admission, onward)    None       Subjective:  Patient seen and examined at the bedside today.  She feels better today.  She was still on 2 L of oxygen.  Denies any worsening shortness of breath or cough.  Bilateral lower extremity edema have improved.  Possible revealed mild bilateral expiratory  wheezes  Objective: Vitals:   04/01/22 0400 04/01/22 0939 04/01/22 0940 04/01/22 0941  BP:      Pulse:      Resp: 15     Temp:      TempSrc:      SpO2:  98% 98% 96%  Weight:      Height:        Intake/Output Summary (Last 24 hours) at 04/01/2022 1308 Last data filed at 04/01/2022 1224 Gross per 24 hour  Intake 720 ml  Output 2850 ml  Net -2130 ml   Filed Weights   03/31/22 1147  Weight: 106.6 kg    Examination:  General exam: Overall comfortable, not in distress,obese HEENT: PERRL Respiratory system: Bilateral expiratory wheezing Cardiovascular system: S1 & S2 heard, RRR.  Gastrointestinal system: Abdomen is nondistended, soft and nontender. Central nervous system: Alert and oriented Extremities: Trace lowe extremity edema, no clubbing ,no cyanosis Skin: No rashes, no ulcers,no icterus     Data Reviewed: I have personally reviewed following labs and imaging studies  CBC: Recent Labs  Lab 03/31/22 1239 04/01/22 0345  WBC 11.0* 7.8  NEUTROABS 8.5*  --   HGB 9.6* 10.2*  HCT 30.9* 32.6*  MCV 87.5 87.6  PLT 234 287   Basic Metabolic Panel: Recent Labs  Lab 03/31/22 1239 04/01/22 0345  NA 138 139  K 3.3* 4.0  CL 93* 95*  CO2 35* 33*  GLUCOSE 88 158*  BUN 8 13  CREATININE 0.55 0.59  CALCIUM 8.8* 9.5     No results found for this or any previous visit (from the past 240 hour(s)).   Radiology Studies: DG Chest 2 View  Result Date: 03/31/2022 CLINICAL DATA:  Shortness of breath.  Hypoxia. EXAM: CHEST - 2 VIEW COMPARISON:  10/27/2021 FINDINGS: There is mild cardiac enlargement. Pulmonary vascular congestion is new from the previous exam. There is blunting of the costophrenic angles concerning for small pleural effusions. No airspace consolidation. Visualized osseous structures are notable for thoracic degenerative disc disease. IMPRESSION: Suspect congestive heart failure. Electronically Signed   By: Kerby Moors M.D.   On: 03/31/2022 12:31     Scheduled Meds:  apixaban  5 mg Oral BID   atorvastatin  20 mg Oral QHS   cholecalciferol  1,000 Units Oral Daily   dapagliflozin propanediol  10 mg Oral Daily   furosemide  40 mg Intravenous Q12H   guaiFENesin  600 mg Oral BID   ipratropium-albuterol  3 mL Nebulization Q6H   metoprolol succinate  50 mg Oral Daily   mometasone-formoterol  2 puff Inhalation BID   pantoprazole  40 mg Oral Daily   polyethylene glycol  17 g Oral Daily   predniSONE  40 mg Oral Q breakfast   thiamine  100 mg Oral Daily   traZODone  50 mg Oral QHS   Continuous Infusions:   LOS: 0 days   Shelly Coss, MD [facility] P9/06/2022, 1:08 PM

## 2022-04-01 NOTE — Progress Notes (Signed)
Mobility Specialist - Progress Note   04/01/22 1442  Mobility  HOB Elevated/Bed Position Self regulated  Activity Ambulated with assistance in hallway  Range of Motion/Exercises Active  Level of Assistance Standby assist, set-up cues, supervision of patient - no hands on  Assistive Device Front wheel walker  Distance Ambulated (ft) 200 ft  Activity Response Tolerated well  Transport method Ambulatory  $Mobility charge 1 Mobility   Pt received in recliner and agreeable to mobility. Pt took one standing rest break during ambulation. Pt SOB towards EOS. Pt to recliner after session with all needs met.      During mobility: 90% SpO2 Post-mobility: 91% SPO2  Danelle Berry Mobility Specialist  [facility] Mobility Specialist

## 2022-04-01 NOTE — Progress Notes (Signed)
Patient weaned to room air, SpO2 99 while sitting in chair.  Angie Fava, RN

## 2022-04-01 NOTE — Progress Notes (Signed)
PT Cancellation Note  Patient Details Name: [name] [name] MRN: [id number] DOB: 26-Mar-1961   Cancelled Treatment:    Reason Eval/Treat Not Completed: Patient at procedure or test/unavailable (pt meeting with respiratory therapist at present. Noted pt ambulated 200' with mobility specialist today, with SaO2 90% on room air. Will follow.)  Philomena Doheny PT 04/01/2022  Acute Rehabilitation Services  Office [phone]

## 2022-04-01 NOTE — Plan of Care (Signed)
Discussed with patient plan of care for the evening, pain management and wearing pure wick at bedtime with some teach back displayed.  Problem: Education: Goal: Knowledge of General Education information will improve Description: Including pain rating scale, medication(s)/side effects and non-pharmacologic comfort measures Outcome: Progressing   Problem: Health Behavior/Discharge Planning: Goal: Ability to manage health-related needs will improve Outcome: Progressing

## 2022-04-01 NOTE — Progress Notes (Signed)
Pt expressing concern over being able to perform ADLs at home, and whether she has proper assistive devices. For example, says she gets too out of breath to prepare food for herself, and that her shower chair does not fit her shower.  MD notified.  Angie Fava, RN

## 2022-04-02 DIAGNOSIS — J9621 Acute and chronic respiratory failure with hypoxia: Secondary | ICD-10-CM | POA: Diagnosis not present

## 2022-04-02 LAB — BASIC METABOLIC PANEL
Anion gap: 12 (ref 5–15)
BUN: 18 mg/dL (ref 8–23)
CO2: 32 mmol/L (ref 22–32)
Calcium: 9.3 mg/dL (ref 8.9–10.3)
Chloride: 94 mmol/L — ABNORMAL LOW (ref 98–111)
Creatinine, Ser: 0.57 mg/dL (ref 0.44–1.00)
GFR, Estimated: >60 mL/min (ref >60)
Glucose, Bld: 139 mg/dL — ABNORMAL HIGH (ref 70–99)
Potassium: 3.3 mmol/L — ABNORMAL LOW (ref 3.5–5.1)
Sodium: 138 mmol/L (ref 135–145)

## 2022-04-02 MED ORDER — AMLODIPINE BESYLATE 10 MG PO TABS
5.0000 mg | ORAL_TABLET | Freq: Every day | ORAL | Status: DC
  Administered 2022-04-02 – 2022-04-03 (×2): 5 mg via ORAL
  Filled 2022-04-02 (×2): qty 1

## 2022-04-02 MED ORDER — HYDRALAZINE HCL 20 MG/ML IJ SOLN: 5.0000 mg | INTRAMUSCULAR | Status: DC | PRN

## 2022-04-02 NOTE — Evaluation (Signed)
Physical Therapy Evaluation Patient Details Name: [name] [name] MRN: [id number] DOB: 05/02/1961 Today's Date: 04/02/2022  History of Present Illness  [name] [name] is a 61 y.o. 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 patietn admitted with acute on chronic respiratory failure with hypoxia secondary to COPD exacerbation  Clinical Impression  Pt admitted with above diagnosis.  Pt is  motivated to work with PT. Worked on amb with SPC this date rather than RW. Pt reports feeling more steady today overall, continues to fatigue easily requiring multiple standing rests during amb. Pt verbalizing energy conservation techniques applicable to home environment. Recommend HHPT and RW   Pt currently with functional limitations due to the deficits listed below (see PT Problem List). Pt will benefit from skilled PT to increase their independence and safety with mobility to allow discharge to the venue listed below.          Recommendations for follow up therapy are one component of a multi-disciplinary discharge planning process, led by the attending physician.  Recommendations may be updated based on patient status, additional functional criteria and insurance authorization.  Follow Up Recommendations Home health PT      Assistance Recommended at Discharge Frequent or constant Supervision/Assistance  Patient can return home with the following  Assist for transportation;Help with stairs or ramp for entrance    Equipment Recommendations Rolling walker (2 wheels)  Recommendations for Other Services       Functional Status Assessment Patient has had a recent decline in their functional status and demonstrates the ability to make significant improvements in function in a reasonable and predictable amount of time.      Precautions / Restrictions Restrictions Weight Bearing Restrictions: No      Mobility  Bed Mobility               General bed mobility comments: in recliner    Transfers Overall transfer level: Needs assistance Equipment used: Straight cane Transfers: Sit to/from Stand             General transfer comment: supervision to monitor O2 levels    Ambulation/Gait Ambulation/Gait assistance: Supervision, Min guard Gait Distance (Feet): 360 Feet Assistive device: Straight cane Gait Pattern/deviations: Step-through pattern, Decreased stride length, Wide base of support       General Gait Details: initially unsteady, stability improved with distance, compensates with wide BOS. 3 standing rests, wall lean d/t DOE. recovered within 5-10 seconds and able to continue. SpO2 in 90s  Stairs            Wheelchair Mobility    Modified Rankin (Stroke Patients Only)       Balance Overall balance assessment: Mild deficits observed, not formally tested                                           Pertinent Vitals/Pain Pain Assessment Pain Assessment: No/denies pain    Home Living Family/patient expects to be discharged to:: Private residence Living Arrangements: Alone Available Help at Discharge: Family Type of Home: Apartment Home Access: Level entry       Home Layout: One level Home Equipment: Cane - single point      Prior Function Prior Level of Function : Independent/Modified Independent  Mobility Comments: SPC and O2 at night   Does not drive. instacarts groceries       Hand Dominance        Extremity/Trunk Assessment   Upper Extremity Assessment Upper Extremity Assessment: Defer to OT evaluation;Overall [facility] for tasks assessed    Lower Extremity Assessment Lower Extremity Assessment: Overall WFL for tasks assessed       Communication   Communication: No difficulties  Cognition Arousal/Alertness:  Awake/alert Behavior During Therapy: WFL for tasks assessed/performed Overall Cognitive Status: Within Functional Limits for tasks assessed                                          General Comments      Exercises     Assessment/Plan    PT Assessment Patient needs continued PT services  PT Problem List Decreased mobility;Cardiopulmonary status limiting activity;Decreased balance;Decreased knowledge of use of DME       PT Treatment Interventions DME instruction;Therapeutic exercise;Gait training;Functional mobility training;Therapeutic activities;Patient/family education    PT Goals (Current goals can be found in the Care Plan section)  Acute Rehab PT Goals Patient Stated Goal: to get stronger PT Goal Formulation: With patient Time For Goal Achievement: 04/16/22 Potential to Achieve Goals: Good    Frequency Min 3X/week     Co-evaluation               AM-PAC PT "6 Clicks" Mobility  Outcome Measure Help needed turning from your back to your side while in a flat bed without using bedrails?: None Help needed moving from lying on your back to sitting on the side of a flat bed without using bedrails?: None Help needed moving to and from a bed to a chair (including a wheelchair)?: None Help needed standing up from a chair using your arms (e.g., wheelchair or bedside chair)?: None Help needed to walk in hospital room?: A Little Help needed climbing 3-5 steps with a railing? : A Little 6 Click Score: 22    End of Session Equipment Utilized During Treatment: Gait belt Activity Tolerance: Patient tolerated treatment well Patient left: in chair;with call bell/phone within reach;with chair alarm set   PT Visit Diagnosis: Other abnormalities of gait and mobility (R26.89);Difficulty in walking, not elsewhere classified (R26.2)    Time: 8588-5027 PT Time Calculation (min) (ACUTE ONLY): 19 min   Charges:   PT Evaluation $PT Eval Low Complexity: [place], PT  Acute Rehab Dept [facility]) [phone]  [facility] Weekend Pager [facility] only)  [phone]  04/02/2022   [facility] 04/02/2022, 5:22 PM

## 2022-04-02 NOTE — Progress Notes (Signed)
PROGRESS NOTE  [name] [name] MRN:[id number] DOB: 08/03/1960 DOA: 03/31/2022 PCP: Edwards, Michelle P, NP   LOS: 1 day   Brief Narrative / Interim history: [name] [name] is a 61 y.o. female with medical history significant of COPD/asthma, chronic hypoxic respiratory failure on 2 L of oxygen at night, sleep apnea, paroxysmal A-fib on anticoagulation, diastolic CHF, hyperlipidemia who presented from home with complaints of progressive shortness of breath.  She was using 2 L of oxygen at home at night only but she has been continuously requiring it.  Patient has been feeling very weak, exhausted, also coughing .Found to be in A-fib on presentation.  She was wheezing so she was given steroids.  Also found to be volume overloaded with chest x-ray with features of CHF.  Given a dose of Lasix 60 mg IV once.  Patient was admitted for the management of acute on chronic hypoxic/hypercarbic respiratory failure secondary to COPD/CHF exacerbation  Significant events: 9/11-admission to the hospital  Significant imaging / results / micro data: Chest x-ray 9/11-suspect CHF  Subjective / 24h Interval events: Doing well, feeling better.  Less dyspnea  Assesement and Plan: Principal Problem:   Acute on chronic respiratory failure with hypoxia (HCC) Active Problems:   Paroxysmal atrial fibrillation (HCC)   Acute on chronic diastolic CHF (congestive heart failure) (HCC)   COPD exacerbation (HCC)   Acute on chronic respiratory failure with hypoxia and hypercapnia (HCC)   Principal problem Acute on chronic hypoxic respiratory failure-on 2 L at home, multifactorial now including CHF, COPD, underlying OSA.  To Covid oxygen this morning and she is feeling well.  Has not walked yet.  Active problems COPD exacerbation-continue steroids, nebulizers.  Wheezing has resolved this morning.  Acute on chronic diastolic CHF-most recent 2D echo earlier this year which showed EF of 50-55%, grade 3 diastolic dysfunction.   Has been placed on IV Lasix, continue.  PAF-continue metoprolol as well as Eliquis.  Follows with cardiology.  Status post cardioversion in April 2023  HLD-continue Lipitor  OSA-recently diagnosed, per prior notes does not want to use CPAP  Iron deficiency anemia-hemoglobin stable  HTN-continue home medications  Hypokalemia-supplement and monitor  Prior EtOH use-drinks socially  Obesity, class III-BMI 41    Scheduled Meds:  apixaban  5 mg Oral BID   atorvastatin  20 mg Oral QHS   cholecalciferol  1,000 Units Oral Daily   dapagliflozin propanediol  10 mg Oral Daily   furosemide  40 mg Intravenous Q12H   guaiFENesin  600 mg Oral BID   ipratropium-albuterol  3 mL Nebulization Q6H   metoprolol succinate  50 mg Oral Daily   mometasone-formoterol  2 puff Inhalation BID   pantoprazole  40 mg Oral Daily   polyethylene glycol  17 g Oral Daily   predniSONE  40 mg Oral Q breakfast   thiamine  100 mg Oral Daily   traZODone  50 mg Oral QHS   Continuous Infusions: PRN Meds:.acetaminophen, morphine injection, phenol, polyethylene glycol  Current Outpatient Medications  Medication Instructions   albuterol (VENTOLIN HFA) 108 (90 Base) MCG/ACT inhaler 2 puffs, Inhalation, Every 6 hours PRN   atorvastatin (LIPITOR) 20 mg, Oral, Daily at bedtime   azithromycin (ZITHROMAX) 250 MG tablet Take by mouth.   Blood Pressure Monitor KIT 1 kit, Does not apply, 3 times daily PRN   BREO ELLIPTA 200-25 MCG/ACT AEPB 1 puff, Inhalation, Daily   BREO ELLIPTA 200-25 MCG/ACT AEPB 1 puff, Inhalation, Daily   cholecalciferol (VITAMIN D3) 1,000 Units,   Oral, Daily   Eliquis 5 mg, Oral, 2 times daily   Farxiga 10 mg, Oral, Daily   hydrochlorothiazide (HYDRODIURIL) 25 mg, Oral, Daily   metoprolol succinate (TOPROL-XL) 50 mg, Oral, Daily, Take with or immediately following a meal.   OXYGEN 2 L/min, Inhalation, Continuous   pantoprazole (PROTONIX) 40 MG tablet TAKE 1 TABLET (40 MG TOTAL) BY MOUTH DAILY FOR  ACID REFLUX   SYMBICORT 160-4.5 MCG/ACT inhaler 2 puffs, Inhalation, 2 times daily   thiamine (VITAMIN B1) 100 mg, Oral, Daily   torsemide (DEMADEX) 10 MG tablet TAKE 1/2 TABLET (5 MG TOTAL) BY MOUTH DAILY.   traZODone (DESYREL) 50 mg, Oral, Daily at bedtime    Diet Orders (From admission, onward)     Start     Ordered   03/31/22 1428  Diet Heart Room service appropriate? Yes; Fluid consistency: Thin  Diet effective now       Question Answer Comment  Room service appropriate? Yes   Fluid consistency: Thin      03/31/22 1428            DVT prophylaxis:  apixaban (ELIQUIS) tablet 5 mg   Lab Results  Component Value Date   PLT 271 04/01/2022      Code Status: Full Code  Family Communication: No family at bedside  Status is: Inpatient Remains inpatient appropriate because: Continue diuresis, possibly home 24 hours   Level of care: Progressive  Consultants:  None  Objective: Vitals:   04/02/22 0615 04/02/22 0837 04/02/22 0839 04/02/22 1109  BP: 131/85   (!) 143/87  Pulse: 91   82  Resp: 17 20    Temp: 98.4 F (36.9 C)     TempSrc: Oral     SpO2: 91% 92% 93%   Weight:      Height:        Intake/Output Summary (Last 24 hours) at 04/02/2022 1332 Last data filed at 04/02/2022 0900 Gross per 24 hour  Intake 1080 ml  Output 1800 ml  Net -720 ml   Wt Readings from Last 3 Encounters:  03/31/22 106.6 kg  01/01/22 104.8 kg  12/20/21 105.7 kg    Examination:  Constitutional: NAD Eyes: no scleral icterus ENMT: Mucous membranes are moist.  Neck: normal, supple Respiratory: clear to auscultation bilaterally, no wheezing, no crackles.  Cardiovascular: Regular rate and rhythm, no murmurs / rubs / gallops.  Trace edema Abdomen: non distended, no tenderness. Bowel sounds positive.  Musculoskeletal: no clubbing / cyanosis.  Skin: no rashes Neurologic: non focal   Data Reviewed: I have independently reviewed following labs and imaging studies   CBC Recent  Labs  Lab 03/31/22 1239 04/01/22 0345  WBC 11.0* 7.8  HGB 9.6* 10.2*  HCT 30.9* 32.6*  PLT 234 271  MCV 87.5 87.6  MCH 27.2 27.4  MCHC 31.1 31.3  RDW 19.6* 18.6*  LYMPHSABS 1.5  --   MONOABS 0.9  --   EOSABS 0.0  --   BASOSABS 0.0  --     Recent Labs  Lab 03/31/22 1239 03/31/22 1245 03/31/22 1530 04/01/22 0345 04/02/22 0347  NA 138  --   --  139 138  K 3.3*  --   --  4.0 3.3*  CL 93*  --   --  95* 94*  CO2 35*  --   --  33* 32  GLUCOSE 88  --   --  158* 139*  BUN 8  --   --  13 18  CREATININE 0.55  --   --  0.59 0.57  CALCIUM 8.8*  --   --  9.5 9.3  AST 24  --   --   --   --   ALT 24  --   --   --   --   ALKPHOS 98  --   --   --   --   BILITOT 1.1  --   --   --   --   ALBUMIN 3.3*  --   --   --   --   LATICACIDVEN 1.1  --  1.5  --   --   BNP  --  388.1*  --   --   --     ------------------------------------------------------------------------------------------------------------------ No results for input(s): "CHOL", "HDL", "LDLCALC", "TRIG", "CHOLHDL", "LDLDIRECT" in the last 72 hours.  Lab Results  Component Value Date   HGBA1C 5.6 10/30/2021   ------------------------------------------------------------------------------------------------------------------ No results for input(s): "TSH", "T4TOTAL", "T3FREE", "THYROIDAB" in the last 72 hours.  Invalid input(s): "FREET3"  Cardiac Enzymes No results for input(s): "CKMB", "TROPONINI", "MYOGLOBIN" in the last 168 hours.  Invalid input(s): "CK" ------------------------------------------------------------------------------------------------------------------    Component Value Date/Time   BNP 388.1 (H) 03/31/2022 1245    CBG: No results for input(s): "GLUCAP" in the last 168 hours.  No results found for this or any previous visit (from the past 240 hour(s)).   Radiology Studies: No results found.   Costin Gherghe, MD, PhD [facility]  Between 7 am - 7 pm I am available, please contact me  via Amion (for emergencies) or Securechat (non urgent messages)  Between 7 pm - 7 am I am not available, please contact night coverage MD/APP via Amion  

## 2022-04-02 NOTE — Progress Notes (Signed)
Mobility Specialist - Progress Note   04/02/22 1120  Mobility  HOB Elevated/Bed Position Self regulated  Activity Ambulated with assistance in hallway  Range of Motion/Exercises Active  Level of Assistance Standby assist, set-up cues, supervision of patient - no hands on  Assistive Device Front wheel walker  Distance Ambulated (ft) 350 ft  Activity Response Tolerated well  Transport method Ambulatory  $Mobility charge 1 Mobility   Pt received in hallway and agreeable to mobility. Was surprised by the distance she walked w/ out feeling fatigued.  Pt to recliner after session with all needs met.    [facility]

## 2022-04-02 NOTE — Plan of Care (Signed)
  Problem: Education: Goal: Knowledge of General Education information will improve Description: Including pain rating scale, medication(s)/side effects and non-pharmacologic comfort measures Outcome: Progressing   Problem: Clinical Measurements: Goal: Will remain free from infection Outcome: Progressing Goal: Diagnostic test results will improve Outcome: Progressing Goal: Respiratory complications will improve Outcome: Progressing Goal: Cardiovascular complication will be avoided Outcome: Progressing   Problem: Nutrition: Goal: Adequate nutrition will be maintained Outcome: Progressing   Problem: Elimination: Goal: Will not experience complications related to bowel motility Outcome: Progressing Goal: Will not experience complications related to urinary retention Outcome: Progressing

## 2022-04-02 NOTE — Evaluation (Signed)
Occupational Therapy Evaluation Patient Details Name: [name] [name] MRN: [id number] DOB: 11/04/1960 Today's Date: 04/02/2022   History of Present Illness [name] [name] is a 61 y.o. 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 patietn admitted with acute on chronic respiratory failure with hypoxia secondary to COPD exacerbation   Clinical Impression   [name] is a 61 year old female with above medical history. Prior to hospitalization patient completed all ADLs at moderate independence. Today patient presents with supervision for ambulation with walker and ADL tasks to monitor o2 sats. O2 sat dropped to 88% on RA but recovered to 96%. With ambulation in hall on RA o2 sat 98%. Overall patient is near baseline for ADLs, and does not require further acute OT services. Patient discussed being SOB when attending to IADLs, specifically cooking, and inquiring about proper bathroom set up to increase independence with bathing tasks at home. Therapist recommends [place] OT at discharge to maximize safety, home environment and functional independence.       Recommendations for follow up therapy are one component of a multi-disciplinary discharge planning process, led by the attending physician.  Recommendations may be updated based on patient status, additional functional criteria and insurance authorization.   Follow Up Recommendations  Home health OT    Assistance Recommended at Discharge Set up Supervision/Assistance  Patient can return home with the following Assistance with cooking/housework;Assist for transportation    Functional Status Assessment  Patient has had a recent decline in their functional status and demonstrates the ability to make significant improvements in function in a reasonable and  predictable amount of time.  Equipment Recommendations  None recommended by OT    Recommendations for Other Services       Precautions / Restrictions Precautions Precautions: None Restrictions Weight Bearing Restrictions: No      Mobility Bed Mobility Overal bed mobility: Independent                  Transfers Overall transfer level: Needs assistance                 General transfer comment: supervision to monitor o2 levels      Balance Overall balance assessment: Modified Independent (used 2 wheel walker)                                         ADL either performed or assessed with clinical judgement   ADL Overall ADL's : Needs assistance/impaired Eating/Feeding: Independent   Grooming: Independent   Upper Body Bathing: Supervision/ safety;Sitting   Lower Body Bathing: Supervison/ safety;Sit to/from stand   Upper Body Dressing : Sitting;Supervision/safety   Lower Body Dressing: Sit to/from stand;Supervision/safety   Toilet Transfer: Modified Independent;BSC/3in1   Toileting- Clothing Manipulation and Hygiene: Modified independent;Sit to/from stand       Functional mobility during ADLs: Supervision/safety;Rolling walker (2 wheels)       Vision   Vision Assessment?: No apparent visual deficits     Perception     Praxis      Pertinent Vitals/Pain Pain Assessment Pain Assessment: Faces Faces Pain Scale: No hurt     Hand Dominance     Extremity/Trunk Assessment Upper Extremity Assessment Upper Extremity Assessment: Overall WFL for tasks assessed  Lower Extremity Assessment Lower Extremity Assessment: Defer to PT evaluation   Cervical / Trunk Assessment Cervical / Trunk Assessment: Normal   Communication Communication Communication: No difficulties   Cognition Arousal/Alertness: Awake/alert Behavior During Therapy: WFL for tasks assessed/performed Overall Cognitive Status: Within Functional Limits for  tasks assessed                                       General Comments       Exercises     Shoulder Instructions      Home Living Family/patient expects to be discharged to:: Private residence Living Arrangements: Alone Available Help at Discharge: Family Type of Home: Apartment Home Access: Level entry     Home Layout: One level     Bathroom Shower/Tub: Teacher, early years/pre: Standard     Home Equipment: Cane - single point          Prior Functioning/Environment Prior Level of Function : Independent/Modified Independent             Mobility Comments: SPC and O2.  Does not drive.          OT Problem List: Decreased activity tolerance;Cardiopulmonary status limiting activity      OT Treatment/Interventions:      OT Goals(Current goals can be found in the care plan section) Acute Rehab OT Goals OT Goal Formulation: All assessment and education complete, DC therapy  OT Frequency:      Co-evaluation              AM-PAC OT "6 Clicks" Daily Activity     Outcome Measure Help from another [name] eating meals?: A Little Help from another [name] taking care of personal grooming?: A Little Help from another [name] toileting, which includes using toliet, bedpan, or urinal?: None Help from another [name] bathing (including washing, rinsing, drying)?: A Little Help from another [name] to put on and taking off regular upper body clothing?: A Little Help from another [name] to put on and taking off regular lower body clothing?: A Little 6 Click Score: 13   End of Session Equipment Utilized During Treatment: Rolling walker (2 wheels)  Activity Tolerance: Patient tolerated treatment well Patient left: in chair;with call bell/phone within reach;with chair alarm set  OT Visit Diagnosis: Muscle weakness (generalized) (M62.81)                Time: 0165-5374 OT Time Calculation (min): 26 min Charges:  OT General Charges $OT Visit:  1 Visit OT Evaluation $OT Eval Low Complexity: 1 Low  Charlann Lange, OTS Acute rehab services  Charlann Lange 04/02/2022, 12:23 PM

## 2022-04-03 LAB — BASIC METABOLIC PANEL
Anion gap: 7 (ref 5–15)
BUN: 20 mg/dL (ref 8–23)
CO2: 35 mmol/L — ABNORMAL HIGH (ref 22–32)
Calcium: 8.9 mg/dL (ref 8.9–10.3)
Chloride: 98 mmol/L (ref 98–111)
Creatinine, Ser: 0.74 mg/dL (ref 0.44–1.00)
GFR, Estimated: >60 mL/min (ref >60)
Glucose, Bld: 126 mg/dL — ABNORMAL HIGH (ref 70–99)
Potassium: 3.2 mmol/L — ABNORMAL LOW (ref 3.5–5.1)
Sodium: 140 mmol/L (ref 135–145)

## 2022-04-03 LAB — MAGNESIUM: Magnesium: 1.8 mg/dL (ref 1.7–2.4)

## 2022-04-03 MED ORDER — POTASSIUM CHLORIDE CRYS ER 20 MEQ PO TBCR
40.0000 meq | EXTENDED_RELEASE_TABLET | ORAL | Status: AC
  Administered 2022-04-03 (×2): 40 meq via ORAL
  Filled 2022-04-03 (×2): qty 2

## 2022-04-03 MED ORDER — PREDNISONE 20 MG PO TABS: ORAL_TABLET | ORAL | 0 refills | Status: AC

## 2022-04-03 MED ORDER — POTASSIUM CHLORIDE CRYS ER 20 MEQ PO TBCR: 20.0000 meq | EXTENDED_RELEASE_TABLET | Freq: Every day | ORAL | 0 refills | Status: DC

## 2022-04-03 NOTE — Plan of Care (Signed)
  Problem: Clinical Measurements: Goal: Respiratory complications will improve Outcome: Adequate for Discharge   Problem: Activity: Goal: Risk for activity intolerance will decrease Outcome: Adequate for Discharge   Problem: Nutrition: Goal: Adequate nutrition will be maintained Outcome: Adequate for Discharge   Problem: Elimination: Goal: Will not experience complications related to bowel motility Outcome: Adequate for Discharge   Problem: Pain Managment: Goal: General experience of comfort will improve Outcome: Adequate for Discharge

## 2022-04-03 NOTE — TOC Transition Note (Addendum)
Transition of Care [facility]) - CM/SW Discharge Note   Patient Details  Name: NACOLE FLUHR MRN: [id number] Date of Birth: October 14, 1960  [facility] CM/SW Contact:  Roseanne Kaufman, RN Phone Number: 04/03/2022, 9:09 AM   Clinical Narrative:   Spoke with patient who request a call back at 9:30am to coordinate her HHPT services.  TOC will continue to follow.  - 10:07am Patient reports she has home O2 with [employer]. She has a portable oxygen at bedside however it is empty. This RNCM contacted [employer] for portable oxygen delivery prior to discharge. Patient reports she does not have a bedside commode or rolling walker, however she does have wheelchair. Per chart review [employer] provided patient with the followig DME: bedside commode, rolling walker, wheelchair. Patient reports depending on time of discharge her friend will transport her at discharge.  - 10:17am Caryl Pina with [facility] will have home oxygen delivered to bedside. This RNCM awaiting [name] agencies response for accepting patient for HHPT.  TOC will continue follow, awaiting call from [place] and [place].  - 10:48am: Cory with [facility] has accepted patient for HHPT. Per Andee Poles with [employer] patient received a rolling walker 05/16/21 and insurance will not pay for another until 05/17/2026. Attempted to notify patient. Awaiting portable oxygen to be delivered.   TOC will continue to follow.  Final next level of care: [place]     Patient Goals and CMS Choice Patient states their goals for this hospitalization and ongoing recovery are:: return home      Discharge Placement                       Discharge Plan and Services In-house Referral: NA Discharge Planning Services: CM Consult            DME Arranged: N/A DME Agency: NA       HH Arranged: PT          Social Determinants of Health (SDOH) Interventions     Readmission Risk Interventions     No data to display

## 2022-04-03 NOTE — Discharge Summary (Signed)
Physician Discharge Summary  [name] [name] XIP:[id number] DOB: 1961-04-15 DOA: 03/31/2022  PCP: Kerin Perna, NP  Admit date: 03/31/2022 Discharge date: 04/03/2022  Admitted From: home Disposition:  home  Recommendations for Outpatient Follow-up:  Follow up with PCP in 1-2 weeks Please obtain BMP/CBC in one week  Home Health: PT Equipment/Devices: walker, home O2  Discharge Condition: stable CODE STATUS: Full code  HPI: Per admitting MD, [name] [name] is a 61 y.o. female with medical history significant of COPD/asthma, chronic hypoxic respiratory failure on 2 L of oxygen at night, sleep apnea, paroxysmal A-fib on anticoagulation, diastolic CHF, hyperlipidemia who presented from home with complaints of progressive shortness of breath.  Patient started feeling short of breath since last Thursday and progressively got worse. She lives alone.She is a nonsmoker. She was using 2 L of oxygen at home at night only but she has been continuously requiring it.  Patient has been feeling very weak, exhausted, also coughing . Today she missed her appointment with her PCP.  She has been using her inhalers more frequently than normal.  She is short of breath when she lies flat. Patient seen and examined at the bedside this afternoon.  During my evaluation she was not in severe respiratory distress.  She was on 3 L oxygen per minute. No history of fever, chills, chest pain, nausea, vomiting, abdominal pain, dysuria, hematochezia, melena or loss of consciousness. Patient recently had a sleep study and was found to be positive for sleep apnea and was recommended to wear CPAP .    Hospital Course / Discharge diagnoses: Principal Problem:   Acute on chronic respiratory failure with hypoxia (HCC) Active Problems:   Paroxysmal atrial fibrillation (HCC)   Acute on chronic diastolic CHF (congestive heart failure) (HCC)   COPD exacerbation (HCC)   Acute on chronic respiratory failure with hypoxia and  hypercapnia (HCC)   Principal problem Acute on chronic hypoxic respiratory failure-on 2 L at home mainly at night, multifactorial now including CHF, COPD exacerbation, underlying OSA.  Her hypoxia resolved, she was able to be weaned off to room air, able to work with PT, wheezing has resolved and she will be discharged home in stable condition.  She will have a quick prednisone taper.   Active problems COPD exacerbation-continue steroids, nebulizers.  Wheezing has resolved, prednisone taper on discharge as above.  She is to resume her home Breo Ellipta and albuterol.  She is no longer taking Symbicort per patient.  Acute on chronic diastolic CHF-most recent 2D echo earlier this year which showed EF of 67-34%, grade 3 diastolic dysfunction.  Responded well to IV Lasix, she is currently euvolemic, resume home torsemide upon discharge  PAF-continue metoprolol as well as Eliquis.  Follows with cardiology.  Status post cardioversion in April 2023 HLD-continue Lipitor OSA-recently diagnosed, per prior notes does not want to use CPAP Iron deficiency anemia-hemoglobin stable HTN-continue home medications Hypokalemia-supplemented adequately prior to discharge.  We will continue potassium for 3 additional days.  She was advised to follow-up blood work next week  Prior EtOH use-drinks socially Obesity, class III-BMI 41, she would benefit from weight loss  Sepsis ruled out   Discharge Instructions   Allergies as of 04/03/2022       Reactions   Sodium Ferric Gluconate [ferrous Gluconate] Other (See Comments)   Patient had near syncope with abdominal pain and diaphoresis about an hour after IV ferric gluconate although it was while she was on commode having bowel movements  Tetracyclines & Related Itching        Medication List     STOP taking these medications    azithromycin 250 MG tablet Commonly known as: ZITHROMAX   Symbicort 160-4.5 MCG/ACT inhaler Generic drug:  budesonide-formoterol       TAKE these medications    albuterol 108 (90 Base) MCG/ACT inhaler Commonly known as: VENTOLIN HFA Inhale 2 puffs into the lungs every 6 (six) hours as needed for wheezing or shortness of breath.   atorvastatin 20 MG tablet Commonly known as: LIPITOR Take 1 tablet (20 mg total) by mouth at bedtime.   Blood Pressure Monitor Kit 1 kit by Does not apply route 3 (three) times daily as needed.   Breo Ellipta 200-25 MCG/ACT Aepb Generic drug: fluticasone furoate-vilanterol Inhale 1 puff into the lungs daily.   cholecalciferol 25 MCG (1000 UNIT) tablet Commonly known as: VITAMIN D3 Take 1,000 Units by mouth daily.   Eliquis 5 MG Tabs tablet Generic drug: apixaban TAKE 1 TABLET (5 MG TOTAL) BY MOUTH 2 (TWO) TIMES DAILY.   Farxiga 10 MG Tabs tablet Generic drug: dapagliflozin propanediol TAKE 1 TABLET (10 MG TOTAL) BY MOUTH DAILY.   hydrochlorothiazide 25 MG tablet Commonly known as: HYDRODIURIL Take 25 mg by mouth daily.   metoprolol succinate 50 MG 24 hr tablet Commonly known as: TOPROL-XL Take 1 tablet (50 mg total) by mouth daily. Take with or immediately following a meal.   OXYGEN Inhale 2 L/min into the lungs continuous.   pantoprazole 40 MG tablet Commonly known as: PROTONIX TAKE 1 TABLET (40 MG TOTAL) BY MOUTH DAILY FOR ACID REFLUX What changed: See the new instructions.   potassium chloride SA 20 MEQ tablet Commonly known as: KLOR-CON M Take 1 tablet (20 mEq total) by mouth daily for 3 days.   predniSONE 20 MG tablet Commonly known as: DELTASONE Take 2 tablets (40 mg total) by mouth daily with breakfast for 2 days, THEN 1.5 tablets (30 mg total) daily with breakfast for 2 days, THEN 1 tablet (20 mg total) daily with breakfast for 2 days, THEN 0.5 tablets (10 mg total) daily with breakfast for 2 days. Start taking on: April 03, 2022   thiamine 100 MG tablet Commonly known as: VITAMIN B1 Take 1 tablet (100 mg total) by mouth  daily.   torsemide 10 MG tablet Commonly known as: DEMADEX TAKE 1/2 TABLET (5 MG TOTAL) BY MOUTH DAILY. What changed: See the new instructions.   traZODone 50 MG tablet Commonly known as: DESYREL Take 1 tablet (50 mg total) by mouth at bedtime.               Durable Medical Equipment  (From admission, onward)           Start     Ordered   04/02/22 1725  For home use only DME Walker rolling  Once       Question Answer Comment  Walker: With [facility]   Patient needs a walker to treat with the following condition Difficulty in walking, not elsewhere classified      04/02/22 1725            Follow-up Information     Kerin Perna, NP Follow up in 1 week(s).   Specialty: Internal Medicine Why: for repeat bloodwork Contact information: [address] [place] [address] [phone]                 Consultations: none  Procedures/Studies:  DG Chest 2  View  Result Date: 03/31/2022 CLINICAL DATA:  Shortness of breath.  Hypoxia. EXAM: CHEST - 2 VIEW COMPARISON:  10/27/2021 FINDINGS: There is mild cardiac enlargement. Pulmonary vascular congestion is new from the previous exam. There is blunting of the costophrenic angles concerning for small pleural effusions. No airspace consolidation. Visualized osseous structures are notable for thoracic degenerative disc disease. IMPRESSION: Suspect congestive heart failure. Electronically Signed   By: Kerby Moors M.D.   On: 03/31/2022 12:31     Subjective: - no chest pain, shortness of breath, no abdominal pain, nausea or vomiting.   Discharge Exam: BP (!) 164/88 (BP Location: Right Wrist)   Pulse 89   Temp 97.8 F (36.6 C) (Oral)   Resp 16   Ht '5\' 3"'  (1.6 m)   Wt 106.6 kg   SpO2 94%   BMI 41.63 kg/m   General: Pt is alert, awake, not in acute distress Cardiovascular: RRR, S1/S2 +, no rubs, no gallops Respiratory: CTA bilaterally, no wheezing, no rhonchi Abdominal: Soft, NT, ND,  bowel sounds + Extremities: no edema, no cyanosis    The results of significant diagnostics from this hospitalization (including imaging, microbiology, ancillary and laboratory) are listed below for reference.     Microbiology: No results found for this or any previous visit (from the past 240 hour(s)).   Labs: Basic Metabolic Panel: Recent Labs  Lab 03/31/22 1239 04/01/22 0345 04/02/22 0347 04/03/22 0413  NA 138 139 138 140  K 3.3* 4.0 3.3* 3.2*  CL 93* 95* 94* 98  CO2 35* 33* 32 35*  GLUCOSE 88 158* 139* 126*  BUN '8 13 18 20  ' CREATININE 0.55 0.59 0.57 0.74  CALCIUM 8.8* 9.5 9.3 8.9  MG  --   --   --  1.8   Liver Function Tests: Recent Labs  Lab 03/31/22 1239  AST 24  ALT 24  ALKPHOS 98  BILITOT 1.1  PROT 7.0  ALBUMIN 3.3*   CBC: Recent Labs  Lab 03/31/22 1239 04/01/22 0345  WBC 11.0* 7.8  NEUTROABS 8.5*  --   HGB 9.6* 10.2*  HCT 30.9* 32.6*  MCV 87.5 87.6  PLT 234 271   CBG: No results for input(s): "GLUCAP" in the last 168 hours. Hgb A1c No results for input(s): "HGBA1C" in the last 72 hours. Lipid Profile No results for input(s): "CHOL", "HDL", "LDLCALC", "TRIG", "CHOLHDL", "LDLDIRECT" in the last 72 hours. Thyroid function studies No results for input(s): "TSH", "T4TOTAL", "T3FREE", "THYROIDAB" in the last 72 hours.  Invalid input(s): "FREET3" Urinalysis    Component Value Date/Time   COLORURINE STRAW (A) 05/14/2021 [place] 05/14/2021 1033   LABSPEC 1.009 05/14/2021 1033   PHURINE 6.0 05/14/2021 [place] 05/14/2021 1033   [place] 05/14/2021 1033   [name] 05/14/2021 1033   KETONESUR NEGATIVE 05/14/2021 1033   PROTEINUR NEGATIVE 05/14/2021 1033   UROBILINOGEN 0.2 04/07/2011 1405   NITRITE NEGATIVE 05/14/2021 1033   LEUKOCYTESUR NEGATIVE 05/14/2021 1033    FURTHER DISCHARGE INSTRUCTIONS:   Get Medicines reviewed and adjusted: Please take all your medications with you for your  next visit with your Primary MD   Laboratory/radiological data: Please request your Primary MD to go over all hospital tests and procedure/radiological results at the follow up, please ask your Primary MD to get all Hospital records sent to his/her office.   In some cases, they will be blood work, cultures and biopsy results pending at the time of your discharge. Please request  that your primary care M.D. goes through all the records of your hospital data and follows up on these results.   Also Note the following: If you experience worsening of your admission symptoms, develop shortness of breath, life threatening emergency, suicidal or homicidal thoughts you must seek medical attention immediately by calling 911 or calling your MD immediately  if symptoms less severe.   You must read complete instructions/literature along with all the possible adverse reactions/side effects for all the Medicines you take and that have been prescribed to you. Take any new Medicines after you have completely understood and accpet all the possible adverse reactions/side effects.    Do not drive when taking Pain medications or sleeping medications (Benzodaizepines)   Do not take more than prescribed Pain, Sleep and Anxiety Medications. It is not advisable to combine anxiety,sleep and pain medications without talking with your primary care practitioner   Special Instructions: If you have smoked or chewed Tobacco  in the last 2 yrs please stop smoking, stop any regular Alcohol  and or any Recreational drug use.   Wear Seat belts while driving.   Please note: You were cared for by a hospitalist during your hospital stay. Once you are discharged, your primary care physician will handle any further medical issues. Please note that NO REFILLS for any discharge medications will be authorized once you are discharged, as it is imperative that you return to your primary care physician (or establish a relationship with a  primary care physician if you do not have one) for your post hospital discharge needs so that they can reassess your need for medications and monitor your lab values.  Time coordinating discharge: 40 minutes  SIGNED:  Marzetta Board, MD, PhD 04/03/2022, 8:14 AM

## 2022-04-03 NOTE — TOC Transition Note (Signed)
Transition of Care [facility]) - CM/SW Discharge Note   Patient Details  Name: [name] [name] MRN: [id number] Date of Birth: 1960-10-01  [facility] CM/SW Contact:  Roseanne Kaufman, RN Phone Number: 04/03/2022, 11:47 AM   Clinical Narrative:   Spoke with patient at bedside to advise she received a rolling walker 05/16/21 per [employer]. Patient will need pay out of pocket if she wants another. Patient chose not to get a new rolling walker while in the hospital. Patient has received her home portable oxygen from [place] at bedside. RN notified. [employer] is calling her ride for pick up.  No additional TOC needs.     Final next level of care: Home w Home Health Services Barriers to Discharge: No Barriers Identified   Patient Goals and CMS Choice Patient states their goals for this hospitalization and ongoing recovery are:: return home      Discharge Placement  Home with HHPT              Patient to be transferred to facility by: friend      Discharge Plan and Services In-house Referral: NA Discharge Planning Services: CM Consult            DME Arranged:  (Patient is not eligible for another rolling walker as she received on 05/16/2021. Patient will have to pay out of pocket or use the one she has.) DME Agency: [employer] Date DME Agency Contacted: 04/03/22   Representative spoke with at DME Agency: [place]: PT [place]: NA        Social Determinants of Health ([place]) Interventions     Readmission Risk Interventions     No data to display

## 2022-04-03 NOTE — Progress Notes (Signed)
Mobility Specialist Cancellation/Refusal Note:    04/03/22 1018  Mobility  Activity Refused mobility     Reason for Cancellation/Refusal: Pt declined mobility at this time. Pt preparing to get a shower. Will check back as schedule permits.      [facility]

## 2022-04-07 ENCOUNTER — Telehealth: Payer: Self-pay

## 2022-04-07 NOTE — Telephone Encounter (Signed)
Transition Care Management Follow-up Telephone Call Date of discharge and from where: 04/03/2022, [facility] Juluis Mire, NP at [facility]  is listed as her PCP; but the patient said she has a new PCP and is no longer going to [facility]. Ms Oletta Lamas, NP removed as PCP in [place].

## 2022-04-22 ENCOUNTER — Ambulatory Visit: Payer: Medicare Other | Admitting: Primary Care

## 2022-05-06 ENCOUNTER — Encounter: Payer: Self-pay | Admitting: Primary Care

## 2022-05-06 ENCOUNTER — Ambulatory Visit (INDEPENDENT_AMBULATORY_CARE_PROVIDER_SITE_OTHER): Payer: Medicare Other | Admitting: Primary Care

## 2022-05-06 VITALS — BP 110/70 | HR 65 | Ht 63.0 in | Wt 225.6 lb

## 2022-05-06 DIAGNOSIS — J4520 Mild intermittent asthma, uncomplicated: Secondary | ICD-10-CM

## 2022-05-06 DIAGNOSIS — J9621 Acute and chronic respiratory failure with hypoxia: Secondary | ICD-10-CM

## 2022-05-06 DIAGNOSIS — J9622 Acute and chronic respiratory failure with hypercapnia: Secondary | ICD-10-CM

## 2022-05-06 DIAGNOSIS — G4730 Sleep apnea, unspecified: Secondary | ICD-10-CM

## 2022-05-06 DIAGNOSIS — R0683 Snoring: Secondary | ICD-10-CM | POA: Diagnosis not present

## 2022-05-06 DIAGNOSIS — J441 Chronic obstructive pulmonary disease with (acute) exacerbation: Secondary | ICD-10-CM | POA: Diagnosis not present

## 2022-05-06 DIAGNOSIS — G4734 Idiopathic sleep related nonobstructive alveolar hypoventilation: Secondary | ICD-10-CM

## 2022-05-06 NOTE — Assessment & Plan Note (Signed)
-   Patient had in-lab sleep study on 01/01/2022 that showed severe OSA, AHI 63.8 and SpO2 low of 68r. recommending patient be started on CPAP due to severity of OSA and underlying cardiac history.  Patient is open to starting treatment.  We will place an order for patient to be started on auto CPAP 5 to 20 cm H2O with mask of choice.  Advised patient aim to wear CPAP every night for minimum of 4 to 6 hours or longer.  Encouraged weight loss efforts.  Advised against driving if experiencing excessive daytime sleepiness or fatigue.  Follow-up in 31 to 90 days after starting CPAP for compliance check.

## 2022-05-06 NOTE — Assessment & Plan Note (Signed)
-   Continue nocturnal oxygen until patient receives CPAP - We will check overnight oximetry once patient has been started on CPAP therapy to ensure she does not need supplement nocturnal oxygen

## 2022-05-06 NOTE — Assessment & Plan Note (Signed)
Resolved

## 2022-05-06 NOTE — Patient Instructions (Addendum)
Sleep study in June showed severe obstructive sleep apnea, recommend you be started on CPAP  Recommendations: - Continue to wear oxygen at night until you receive CPAP  - Once you receive CPAP aim to wear every night for minimum 4 to 6 hours or longer (call me when you get CPAP and we will order overnight oximetry test to ensure you do not need oxygen at night) - Work on weight loss efforts if able - Do not drive experiencing excessive daytime sleepiness fatigue - Focus on side sleeping position or elevate head of bed at night while sleeping  Orders: - New CPAP auto 5-20cm h20   Follow-up: - 2 months with Beth NP    CPAP and BIPAP Information CPAP and BIPAP are methods that use air pressure to keep your airways open and to help you breathe well. CPAP and BIPAP use different amounts of pressure. Your health care provider will tell you whether CPAP or BIPAP would be more helpful for you. CPAP stands for "continuous positive airway pressure." With CPAP, the amount of pressure stays the same while you breathe in (inhale) and out (exhale). BIPAP stands for "bi-level positive airway pressure." With BIPAP, the amount of pressure will be higher when you inhale and lower when you exhale. This allows you to take larger breaths. CPAP or BIPAP may be used in the hospital, or your health care provider may want you to use it at home. You may need to have a sleep study before your health care provider can order a machine for you to use at home. What are the advantages? CPAP or BIPAP can be helpful if you have: Sleep apnea. Chronic obstructive pulmonary disease (COPD). Heart failure. Medical conditions that cause muscle weakness, including muscular dystrophy or amyotrophic lateral sclerosis (ALS). Other problems that cause breathing to be shallow, weak, abnormal, or difficult. CPAP and BIPAP are most commonly used for obstructive sleep apnea (OSA) to keep the airways from collapsing when the muscles  relax during sleep. What are the risks? Generally, this is a safe treatment. However, problems may occur, including: Irritated skin or skin sores if the mask does not fit properly. Dry or stuffy nose or nosebleeds. Dry mouth. Feeling gassy or bloated. Sinus or lung infection if the equipment is not cleaned properly. When should CPAP or BIPAP be used? In most cases, the mask only needs to be worn during sleep. Generally, the mask needs to be worn throughout the night and during any daytime naps. People with certain medical conditions may also need to wear the mask at other times, such as when they are awake. Follow instructions from your health care provider about when to use the machine. What happens during CPAP or BIPAP?  Both CPAP and BIPAP are provided by a small machine with a flexible plastic tube that attaches to a plastic mask that you wear. Air is blown through the mask into your nose or mouth. The amount of pressure that is used to blow the air can be adjusted on the machine. Your health care provider will set the pressure setting and help you find the best mask for you. Tips for using the mask Because the mask needs to be snug, some people feel trapped or closed-in (claustrophobic) when first using the mask. If you feel this way, you may need to get used to the mask. One way to do this is to hold the mask loosely over your nose or mouth and then gradually apply the mask more snugly.  You can also gradually increase the amount of time that you use the mask. Masks are available in various types and sizes. If your mask does not fit well, talk with your health care provider about getting a different one. Some common types of masks include: Full face masks, which fit over the mouth and nose. Nasal masks, which fit over the nose. Nasal pillow or prong masks, which fit into the nostrils. If you are using a mask that fits over your nose and you tend to breathe through your mouth, a chin strap may  be applied to help keep your mouth closed. Use a skin barrier to protect your skin as told by your health care provider. Some CPAP and BIPAP machines have alarms that may sound if the mask comes off or develops a leak. If you have trouble with the mask, it is very important that you talk with your health care provider about finding a way to make the mask easier to tolerate. Do not stop using the mask. There could be a negative impact on your health if you stop using the mask. Tips for using the machine Place your CPAP or BIPAP machine on a secure table or stand near an electrical outlet. Know where the on/off switch is on the machine. Follow instructions from your health care provider about how to set the pressure on your machine and when you should use it. Do not eat or drink while the CPAP or BIPAP machine is on. Food or fluids could get pushed into your lungs by the pressure of the CPAP or BIPAP. For home use, CPAP and BIPAP machines can be rented or purchased through home health care companies. Many different brands of machines are available. Renting a machine before purchasing may help you find out which particular machine works well for you. Your health insurance company may also decide which machine you may get. Keep the CPAP or BIPAP machine and attachments clean. Ask your health care provider for specific instructions. Check the humidifier if you have a dry stuffy nose or nosebleeds. Make sure it is working correctly. Follow these instructions at home: Take over-the-counter and prescription medicines only as told by your health care provider. Ask if you can take sinus medicine if your sinuses are blocked. Do not use any products that contain nicotine or tobacco. These products include cigarettes, chewing tobacco, and vaping devices, such as e-cigarettes. If you need help quitting, ask your health care provider. Keep all follow-up visits. This is important. Contact a health care provider  if: You have redness or pressure sores on your head, face, mouth, or nose from the mask or head gear. You have trouble using the CPAP or BIPAP machine. You cannot tolerate wearing the CPAP or BIPAP mask. Someone tells you that you snore even when wearing your CPAP or BIPAP. Get help right away if: You have trouble breathing. You feel confused. Summary CPAP and BIPAP are methods that use air pressure to keep your airways open and to help you breathe well. If you have trouble with the mask, it is very important that you talk with your health care provider about finding a way to make the mask easier to tolerate. Do not stop using the mask. There could be a negative impact to your health if you stop using the mask. Follow instructions from your health care provider about when to use the machine. This information is not intended to replace advice given to you by your health care provider. Make  sure you discuss any questions you have with your health care provider. Document Revised: 02/13/2021 Document Reviewed: 06/15/2020 Elsevier Patient Education  [place].

## 2022-05-06 NOTE — Assessment & Plan Note (Deleted)
-   Continue BREO 256mg one puff daily

## 2022-05-06 NOTE — Progress Notes (Signed)
_0  ID: [name] [name], female    DOB: 09-16-1960, 61 y.o.   MRN: [id number]  Chief Complaint  Patient presents with   Follow-up    CPAP compliance     Referring provider: No ref. provider found  HPI: 61 year old female, never smoked.  Past medical history significant for mild intermittent asthma, allergies, acute respiratory failure, elevated blood pressure readings, osteoarthritis, alcohol use disorder, anxiety/depression.   Previous [facility] pulmonary encounter: 03/28/2021 Patient presents today for hospital follow-up.  She was admitted from 03/18/2021-03/25/2021 for acute on chronic hypercarbic respiratory failure, acute on chronic diastolic heart failure.  Patient was consulted by Dr. Loanne Drilling with PCCM inpatient.  Patient presented to emergency room shortness of breath/wheezing.  COVID test was negative.  Oxygen saturation was 89% on room air.  She received albuterol nebulized treatments and IV Solu-Medrol.  It was recommended that she restart her Symbicort for mild intermittent asthma.  BiPAP at bedtime, patient likely has undiagnosed obesity hypoventilation syndrome and possibly OSA.  She was discharged on 2 L of oxygen and prednisone taper.  Need outpatient sleep study.  She is doing well today. She has had not episodes of shortness of breath, wheezing or chest tightness. She is using Breo 24mg daily as prescribed. Prior to hospitalization she was not sleeping well. She is currently taking trazodone prescribed buy her PCP which has been helping.   Sleep questionnaire Symptoms- Snoring, restless sleep Prior sleep study- None Bedtime-9-10pm Out of bed in morning- 6am Nocturnal awakenings- once  Epworth- 7   May 12th 2023- Dr. DErin [name] BLonia [name] a 61year old woman, never smoker with history of asthma, alcohol abuse, atrial fibrillation, hypertension and HFpEF [name] exacerbation and atrial fibrillation with RVR. She returns to pulmonary  clinic for follow up.   She was treated with steroid taper and ICS/LAMA/LABA nebulizer treatments while in the hospital. She had TEE cardiversion for the a fib with RVR on 10/31/21 and new LVEF of 25-30%. She had EGD on 4/17 esophageal plaques (biopsied), 2 cm hiatal hernia and erythematous mucosa in the antrum (biopsied) and normal duodenum (biopsied).  Colonoscopy with normal examined ileum, colon polyps and nonbleeding internal hemorrhoid.  She was discharged with Breo ellipta 200 inhaler 1 puff every other day. She is using albuterol inhaler about 4 times per week. She is using oxygen 2L at night.   She has stress test on Monday with cardiology.  She reports doing well since her hospitalization. She is rarely drinking alcohol now. She denies cough, wheezing or dyspnea. She denies night time awakenings.   05/06/2022- Interim hx  Patient presents today for overdue follow-up. Hx asthma, afib, HF. Admitted in September for acute on chronic diastolic heart failure, COPD exacerbation and acute on chronic respiratory failure. Discharged on prednisone taper. Maintained on Breo 2036m and Torsemide. She is doing well, feeling better. She is here to go over sleep study results from 01/01/22 that showed severe OSA, AHI 63/hour. We discussed treatment options, recommending she be started on CPAP d/t severity of OSA and cardiac history. She is open to starting treatment.    Allergies  Allergen Reactions   Sodium Ferric Gluconate [Ferrous Gluconate] Other (See Comments)    Patient had near syncope with abdominal pain and diaphoresis about an hour after IV ferric gluconate although it was while she was on commode having bowel movements   Tetracyclines & Related Itching    Immunization History  Administered Date(s)  Administered   PPD Test 03/12/2015   Tdap 03/27/2015    Past Medical History:  Diagnosis Date   Asthma    Bloody stool    CHF (congestive heart failure) (HCC)    Chronic diastolic heart  failure ([place])    Dental decay    Gastritis, Helicobacter pylori 62/69/4854   Hypertension    Paroxysmal atrial fibrillation ([place])    Pneumonia 06/21/2015   Prepyloric ulcer 06/22/2015   Seasonal allergies    Shortness of breath dyspnea    with exertion    Tobacco History: Social History   Tobacco Use  Smoking Status Never  Smokeless Tobacco Never   Counseling given: Not Answered   Outpatient Medications Prior to Visit  Medication Sig Dispense Refill   albuterol (VENTOLIN HFA) 108 (90 Base) MCG/ACT inhaler Inhale 2 puffs into the lungs every 6 (six) hours as needed for wheezing or shortness of breath. 8.5 g 1   BREO ELLIPTA 200-25 MCG/ACT AEPB Inhale 1 puff into the lungs daily.     OXYGEN Inhale 2 L/min into the lungs continuous.     atorvastatin (LIPITOR) 20 MG tablet Take 1 tablet (20 mg total) by mouth at bedtime. 90 tablet 1   Blood Pressure Monitor KIT 1 kit by Does not apply route 3 (three) times daily as needed. 1 kit 0   cholecalciferol (VITAMIN D3) 25 MCG (1000 UNIT) tablet Take 1,000 Units by mouth daily.     ELIQUIS 5 MG TABS tablet TAKE 1 TABLET (5 MG TOTAL) BY MOUTH 2 (TWO) TIMES DAILY. 180 tablet 1   FARXIGA 10 MG TABS tablet TAKE 1 TABLET (10 MG TOTAL) BY MOUTH DAILY. 90 tablet 1   hydrochlorothiazide (HYDRODIURIL) 25 MG tablet Take 25 mg by mouth daily.     metoprolol succinate (TOPROL-XL) 50 MG 24 hr tablet Take 1 tablet (50 mg total) by mouth daily. Take with or immediately following a meal. 90 tablet 1   pantoprazole (PROTONIX) 40 MG tablet TAKE 1 TABLET (40 MG TOTAL) BY MOUTH DAILY FOR ACID REFLUX (Patient taking differently: Take 40 mg by mouth daily.) 90 tablet 1   potassium chloride SA (KLOR-CON M) 20 MEQ tablet Take 1 tablet (20 mEq total) by mouth daily for 3 days. 3 tablet 0   thiamine 100 MG tablet Take 1 tablet (100 mg total) by mouth daily. 30 tablet 0   torsemide (DEMADEX) 10 MG tablet TAKE 1/2 TABLET (5 MG TOTAL) BY MOUTH DAILY. (Patient taking  differently: Take 10 mg by mouth daily.) 45 tablet 1   traZODone (DESYREL) 50 MG tablet Take 1 tablet (50 mg total) by mouth at bedtime. 30 tablet 1   No facility-administered medications prior to visit.    Review of Systems  Review of Systems  Constitutional: Negative.   HENT: Negative.    Respiratory:  Negative for cough, shortness of breath and wheezing.   Cardiovascular: Negative.   Psychiatric/Behavioral:  Positive for sleep disturbance.     Physical Exam  BP 110/70 (BP Location: Left Arm)   Pulse 65   Ht _0  (1.6 m)   Wt 225 lb 9.6 oz (102.3 kg)   SpO2 96%   BMI 39.96 kg/m  Physical Exam Constitutional:      Appearance: Normal appearance. She is obese.     Comments: Well appearing  HENT:     Head: Normocephalic and atraumatic.  Cardiovascular:     Rate and Rhythm: Normal rate and regular rhythm.  Pulmonary:  Effort: Pulmonary effort is normal.     Breath sounds: Normal breath sounds. No wheezing, rhonchi or rales.  Skin:    General: Skin is warm and dry.  Neurological:     General: No focal deficit present.     Mental Status: She is alert and oriented to person, place, and time. Mental status is at baseline.  Psychiatric:        Mood and Affect: Mood normal.        Thought Content: Thought content normal.        Judgment: Judgment normal.      Lab Results:  CBC    Component Value Date/Time   WBC 7.8 04/01/2022 0345   RBC 3.72 (L) 04/01/2022 0345   HGB 10.2 (L) 04/01/2022 0345   HGB 13.1 08/22/2021 1143   HCT 32.6 (L) 04/01/2022 0345   HCT 39.8 08/22/2021 1143   PLT 271 04/01/2022 0345   PLT 285 08/22/2021 1143   MCV 87.6 04/01/2022 0345   MCV 87 08/22/2021 1143   MCH 27.4 04/01/2022 0345   MCHC 31.3 04/01/2022 0345   RDW 18.6 (H) 04/01/2022 0345   RDW 14.9 08/22/2021 1143   LYMPHSABS 1.5 03/31/2022 1239   LYMPHSABS 2.1 08/22/2021 1143   MONOABS 0.9 03/31/2022 1239   EOSABS 0.0 03/31/2022 1239   EOSABS 0.1 08/22/2021 1143   BASOSABS  0.0 03/31/2022 1239   BASOSABS 0.0 08/22/2021 1143    BMET    Component Value Date/Time   NA 140 04/03/2022 0413   NA CANCELED 03/12/2021 0901   K 3.2 (L) 04/03/2022 0413   CL 98 04/03/2022 0413   CO2 35 (H) 04/03/2022 0413   GLUCOSE 126 (H) 04/03/2022 0413   BUN 20 04/03/2022 0413   BUN CANCELED 03/12/2021 0901   CREATININE 0.74 04/03/2022 0413   CALCIUM 8.9 04/03/2022 0413   GFRNONAA >60 04/03/2022 0413   GFRAA >60 10/28/2019 1227    BNP    Component Value Date/Time   BNP 388.1 (H) 03/31/2022 1245    ProBNP    Component Value Date/Time   PROBNP 59.0 03/14/2010 0710    Imaging: No results found.   Assessment & Plan:   COPD with acute exacerbation ([place]) - Resolved   Acute on chronic respiratory failure with hypoxia and hypercapnia (HCC) - Resolved; O2 96% room air   Mild intermittent asthma without complication - Continue BREO 290mg one puff daily   Severe sleep apnea - Patient had in-lab sleep study on 01/01/2022 that showed severe OSA, AHI 63.8 and SpO2 low of 68r. recommending patient be started on CPAP due to severity of OSA and underlying cardiac history.  Patient is open to starting treatment.  We will place an order for patient to be started on auto CPAP 5 to 20 cm H2O with mask of choice.  Advised patient aim to wear CPAP every night for minimum of 4 to 6 hours or longer.  Encouraged weight loss efforts.  Advised against driving if experiencing excessive daytime sleepiness or fatigue.  Follow-up in 31 to 90 days after starting CPAP for compliance check.  Nocturnal hypoxia - Continue nocturnal oxygen until patient receives CPAP - We will check overnight oximetry once patient has been started on CPAP therapy to ensure she does not need supplement nocturnal oxygen    EMartyn Ehrich NP 05/06/2022

## 2022-05-06 NOTE — Assessment & Plan Note (Signed)
-   Resolved; O2 96% room air

## 2022-05-06 NOTE — Assessment & Plan Note (Signed)
-   Continue BREO 223mg one puff daily

## 2022-05-12 ENCOUNTER — Ambulatory Visit: Payer: Self-pay | Admitting: Student

## 2022-05-27 ENCOUNTER — Other Ambulatory Visit: Payer: Self-pay | Admitting: Cardiology

## 2022-05-29 ENCOUNTER — Other Ambulatory Visit: Payer: Self-pay | Admitting: Family Medicine

## 2022-05-29 DIAGNOSIS — Z1231 Encounter for screening mammogram for malignant neoplasm of breast: Secondary | ICD-10-CM

## 2022-06-04 ENCOUNTER — Other Ambulatory Visit (HOSPITAL_COMMUNITY): Payer: Self-pay

## 2022-06-13 ENCOUNTER — Other Ambulatory Visit (HOSPITAL_COMMUNITY): Payer: Self-pay

## 2022-06-30 ENCOUNTER — Other Ambulatory Visit: Payer: Medicare Other

## 2022-07-01 ENCOUNTER — Other Ambulatory Visit: Payer: Medicare Other

## 2022-07-07 ENCOUNTER — Ambulatory Visit: Payer: Medicare Other | Admitting: Primary Care

## 2022-07-07 NOTE — Progress Notes (Deleted)
_0  ID: [name] [name], female    DOB: 1961-01-10, 61 y.o.   MRN: [id number]  No chief complaint on file.   Referring provider: Loura Pardon, MD  HPI:  61 year old female, never smoked.  Past medical history significant for mild intermittent asthma, allergies, acute respiratory failure, elevated blood pressure readings, osteoarthritis, alcohol use disorder, anxiety/depression.   Previous [facility] pulmonary encounter: 03/28/2021 Patient presents today for hospital follow-up.  She was admitted from 03/18/2021-03/25/2021 for acute on chronic hypercarbic respiratory failure, acute on chronic diastolic heart failure.  Patient was consulted by Dr. Loanne Drilling with PCCM inpatient.  Patient presented to emergency room shortness of breath/wheezing.  COVID test was negative.  Oxygen saturation was 89% on room air.  She received albuterol nebulized treatments and IV Solu-Medrol.  It was recommended that she restart her Symbicort for mild intermittent asthma.  BiPAP at bedtime, patient likely has undiagnosed obesity hypoventilation syndrome and possibly OSA.  She was discharged on 2 L of oxygen and prednisone taper.  Need outpatient sleep study.  She is doing well today. She has had not episodes of shortness of breath, wheezing or chest tightness. She is using Breo 242mg daily as prescribed. Prior to hospitalization she was not sleeping well. She is currently taking trazodone prescribed buy her PCP which has been helping.   Sleep questionnaire Symptoms- Snoring, restless sleep Prior sleep study- None Bedtime-9-10pm Out of bed in morning- 6am Nocturnal awakenings- once  Epworth- 7   May 12th 2023- [name] BAiris Barbeeis a 61year old woman, never smoker with history of asthma, alcohol abuse, atrial fibrillation, hypertension and HFpEF [facility] asthma exacerbation and atrial fibrillation with RVR. She returns to pulmonary clinic for follow up.   She was treated with  steroid taper and ICS/LAMA/LABA nebulizer treatments while in the hospital. She had TEE cardiversion for the a fib with RVR on 10/31/21 and new LVEF of 25-30%. She had EGD on 4/17 esophageal plaques (biopsied), 2 cm hiatal hernia and erythematous mucosa in the antrum (biopsied) and normal duodenum (biopsied).  Colonoscopy with normal examined ileum, colon polyps and nonbleeding internal hemorrhoid.  She was discharged with Breo ellipta 200 inhaler 1 puff every other day. She is using albuterol inhaler about 4 times per week. She is using oxygen 2L at night.   She has stress test on Monday with cardiology.  She reports doing well since her hospitalization. She is rarely drinking alcohol now. She denies cough, wheezing or dyspnea. She denies night time awakenings.   05/06/2022 Patient presents today for overdue follow-up. Hx asthma, afib, HF. Admitted in September for acute on chronic diastolic heart failure, COPD exacerbation and acute on chronic respiratory failure. Discharged on prednisone taper. Maintained on Breo 2044m and Torsemide. She is doing well, feeling better. She is here to go over sleep study results from 01/01/22 that showed severe OSA, AHI 63/hour. We discussed treatment options, recommending she be started on CPAP d/t severity of OSA and cardiac history. She is open to starting treatment.   07/07/2022- Interim hx  Patient presents today for sleep apnea follow-up. Hx severe OSA.  Patient has in lab sleep study on 01/01/2022 that showed severe obstructive sleep apnea, AHI 63.8 an hour with SpO2 low 68%.  She was started on auto CPAP 5-20cm h20 back in October.            Allergies  Allergen Reactions   Sodium Ferric Gluconate [Ferrous Gluconate] Other (See  Comments)    Patient had near syncope with abdominal pain and diaphoresis about an hour after IV ferric gluconate although it was while she was on commode having bowel movements   Tetracyclines & Related Itching     Immunization History  Administered Date(s) Administered   PPD Test 03/12/2015   Tdap 03/27/2015    Past Medical History:  Diagnosis Date   Asthma    Bloody stool    CHF (congestive heart failure) (HCC)    Chronic diastolic heart failure (HCC)    Dental decay    Gastritis, Helicobacter pylori 31/49/7026   Hypertension    Paroxysmal atrial fibrillation (HCC)    Pneumonia 06/21/2015   Prepyloric ulcer 06/22/2015   Seasonal allergies    Shortness of breath dyspnea    with exertion    Tobacco History: Social History   Tobacco Use  Smoking Status Never  Smokeless Tobacco Never   Counseling given: Not Answered   Outpatient Medications Prior to Visit  Medication Sig Dispense Refill   albuterol (VENTOLIN HFA) 108 (90 Base) MCG/ACT inhaler Inhale 2 puffs into the lungs every 6 (six) hours as needed for wheezing or shortness of breath. 8.5 g 1   atorvastatin (LIPITOR) 20 MG tablet TAKE 1 TABLET (20 MG TOTAL) BY MOUTH AT BEDTIME FOR CHOLESTEROL 30 tablet 0   Blood Pressure Monitor KIT 1 kit by Does not apply route 3 (three) times daily as needed. 1 kit 0   BREO ELLIPTA 200-25 MCG/ACT AEPB Inhale 1 puff into the lungs daily.     cholecalciferol (VITAMIN D3) 25 MCG (1000 UNIT) tablet Take 1,000 Units by mouth daily.     ELIQUIS 5 MG TABS tablet TAKE 1 TABLET (5 MG TOTAL) BY MOUTH 2 (TWO) TIMES DAILY. 180 tablet 1   FARXIGA 10 MG TABS tablet TAKE 1 TABLET (10 MG TOTAL) BY MOUTH DAILY. 90 tablet 1   hydrochlorothiazide (HYDRODIURIL) 25 MG tablet Take 25 mg by mouth daily.     metoprolol succinate (TOPROL-XL) 50 MG 24 hr tablet Take 1 tablet (50 mg total) by mouth daily. Take with or immediately following a meal. 90 tablet 1   OXYGEN Inhale 2 L/min into the lungs continuous.     pantoprazole (PROTONIX) 40 MG tablet TAKE 1 TABLET (40 MG TOTAL) BY MOUTH DAILY FOR ACID REFLUX (Patient taking differently: Take 40 mg by mouth daily.) 90 tablet 1   potassium chloride SA (KLOR-CON M) 20  MEQ tablet Take 1 tablet (20 mEq total) by mouth daily for 3 days. 3 tablet 0   thiamine 100 MG tablet Take 1 tablet (100 mg total) by mouth daily. 30 tablet 0   torsemide (DEMADEX) 10 MG tablet TAKE 1/2 TABLET (5 MG TOTAL) BY MOUTH DAILY. (Patient taking differently: Take 10 mg by mouth daily.) 45 tablet 1   traZODone (DESYREL) 50 MG tablet Take 1 tablet (50 mg total) by mouth at bedtime. 30 tablet 1   No facility-administered medications prior to visit.      Review of Systems  Review of Systems   Physical Exam  There were no vitals taken for this visit. Physical Exam   Lab Results:  CBC    Component Value Date/Time   WBC 7.8 04/01/2022 0345   RBC 3.72 (L) 04/01/2022 0345   HGB 10.2 (L) 04/01/2022 0345   HGB 13.1 08/22/2021 1143   HCT 32.6 (L) 04/01/2022 0345   HCT 39.8 08/22/2021 1143   PLT 271 04/01/2022 0345   PLT 285 08/22/2021  1143   MCV 87.6 04/01/2022 0345   MCV 87 08/22/2021 1143   MCH 27.4 04/01/2022 0345   MCHC 31.3 04/01/2022 0345   RDW 18.6 (H) 04/01/2022 0345   RDW 14.9 08/22/2021 1143   LYMPHSABS 1.5 03/31/2022 1239   LYMPHSABS 2.1 08/22/2021 1143   MONOABS 0.9 03/31/2022 1239   EOSABS 0.0 03/31/2022 1239   EOSABS 0.1 08/22/2021 1143   BASOSABS 0.0 03/31/2022 1239   BASOSABS 0.0 08/22/2021 1143    BMET    Component Value Date/Time   NA 140 04/03/2022 0413   NA CANCELED 03/12/2021 0901   K 3.2 (L) 04/03/2022 0413   CL 98 04/03/2022 0413   CO2 35 (H) 04/03/2022 0413   GLUCOSE 126 (H) 04/03/2022 0413   BUN 20 04/03/2022 0413   BUN CANCELED 03/12/2021 0901   CREATININE 0.74 04/03/2022 0413   CALCIUM 8.9 04/03/2022 0413   GFRNONAA >60 04/03/2022 0413   GFRAA >60 10/28/2019 1227    BNP    Component Value Date/Time   BNP 388.1 (H) 03/31/2022 1245    ProBNP    Component Value Date/Time   PROBNP 59.0 03/14/2010 0710    Imaging: No results found.   Assessment & Plan:   No problem-specific Assessment & Plan notes found for this  encounter.     Martyn Ehrich, NP 07/07/2022

## 2022-07-31 ENCOUNTER — Ambulatory Visit
Admission: RE | Admit: 2022-07-31 | Discharge: 2022-07-31 | Disposition: A | Discharge status: Home / Self Care | Payer: Medicare Other | Source: Ambulatory Visit | Attending: Family Medicine | Admitting: Family Medicine

## 2022-07-31 DIAGNOSIS — Z1231 Encounter for screening mammogram for malignant neoplasm of breast: Secondary | ICD-10-CM

## 2022-08-12 ENCOUNTER — Other Ambulatory Visit (INDEPENDENT_AMBULATORY_CARE_PROVIDER_SITE_OTHER): Payer: Self-pay | Admitting: Primary Care

## 2022-08-12 DIAGNOSIS — J4541 Moderate persistent asthma with (acute) exacerbation: Secondary | ICD-10-CM

## 2022-08-20 LAB — CBC AND DIFFERENTIAL
HCT: 38 (ref 36–46)
Hemoglobin: 12.7 (ref 12.0–16.0)
Neutrophils Absolute: 6814
Platelets: 257 10*3/uL (ref 150–400)
WBC: 10.2

## 2022-08-20 LAB — BASIC METABOLIC PANEL
BUN: 7 (ref 4–21)
CO2: 28 — AB (ref 13–22)
Chloride: 104 (ref 99–108)
Creatinine: 0.6 (ref 0.5–1.1)
Potassium: 3.5 mEq/L (ref 3.5–5.1)
Sodium: 140 (ref 137–147)

## 2022-08-20 LAB — COMPREHENSIVE METABOLIC PANEL
Albumin: 4 (ref 3.5–5.0)
Calcium: 9.2 (ref 8.7–10.7)
Globulin: 2.5
eGFR: 102

## 2022-08-20 LAB — HM HEPATITIS C SCREENING LAB: HM Hepatitis Screen: NEGATIVE

## 2022-08-20 LAB — LIPID PANEL
Cholesterol: 136 (ref 0–200)
HDL: 54 (ref 35–70)
LDL Cholesterol: 69
LDl/HDL Ratio: 2.5
Triglycerides: 54 (ref 40–160)

## 2022-08-20 LAB — HEPATIC FUNCTION PANEL
ALT: 15 U/L (ref 7–35)
AST: 12 — AB (ref 13–35)
Alkaline Phosphatase: 57 (ref 25–125)
Bilirubin, Total: 0.7

## 2022-08-20 LAB — IRON,TIBC AND FERRITIN PANEL
%SAT: 15
Ferritin: 52
Iron: 48

## 2022-08-20 LAB — HM HIV SCREENING LAB: HM HIV Screening: NEGATIVE

## 2022-08-20 LAB — CBC: RBC: 4.25 (ref 3.87–5.11)

## 2022-08-20 LAB — TSH: TSH: 0.47 (ref 0.41–5.90)

## 2022-09-10 ENCOUNTER — Other Ambulatory Visit (INDEPENDENT_AMBULATORY_CARE_PROVIDER_SITE_OTHER): Payer: Self-pay | Admitting: Primary Care

## 2022-09-10 DIAGNOSIS — J4541 Moderate persistent asthma with (acute) exacerbation: Secondary | ICD-10-CM

## 2022-09-24 ENCOUNTER — Other Ambulatory Visit: Payer: Self-pay | Admitting: Cardiology

## 2022-10-04 ENCOUNTER — Encounter (HOSPITAL_COMMUNITY): Payer: Self-pay

## 2022-10-04 ENCOUNTER — Emergency Department (HOSPITAL_COMMUNITY): Payer: 59

## 2022-10-04 ENCOUNTER — Emergency Department (HOSPITAL_COMMUNITY)
Admission: EM | Admit: 2022-10-04 | Discharge: 2022-10-04 | Disposition: A | Discharge status: Home / Self Care | Payer: 59 | Attending: Emergency Medicine | Admitting: Emergency Medicine

## 2022-10-04 ENCOUNTER — Other Ambulatory Visit: Payer: Self-pay

## 2022-10-04 DIAGNOSIS — J45909 Unspecified asthma, uncomplicated: Secondary | ICD-10-CM | POA: Insufficient documentation

## 2022-10-04 DIAGNOSIS — I509 Heart failure, unspecified: Secondary | ICD-10-CM | POA: Diagnosis not present

## 2022-10-04 DIAGNOSIS — I10 Essential (primary) hypertension: Secondary | ICD-10-CM | POA: Diagnosis not present

## 2022-10-04 DIAGNOSIS — K5900 Constipation, unspecified: Secondary | ICD-10-CM

## 2022-10-04 DIAGNOSIS — E876 Hypokalemia: Secondary | ICD-10-CM | POA: Diagnosis not present

## 2022-10-04 DIAGNOSIS — J449 Chronic obstructive pulmonary disease, unspecified: Secondary | ICD-10-CM | POA: Insufficient documentation

## 2022-10-04 DIAGNOSIS — R1084 Generalized abdominal pain: Secondary | ICD-10-CM | POA: Diagnosis present

## 2022-10-04 LAB — CBC WITH DIFFERENTIAL/PLATELET
Abs Immature Granulocytes: 0.02 10*3/uL (ref 0.00–0.07)
Basophils Absolute: 0 10*3/uL (ref 0.0–0.1)
Basophils Relative: 0 %
Eosinophils Absolute: 0 10*3/uL (ref 0.0–0.5)
Eosinophils Relative: 0 %
HCT: 40.9 % (ref 36.0–46.0)
Hemoglobin: 13.2 g/dL (ref 12.0–15.0)
Immature Granulocytes: 0 %
Lymphocytes Relative: 16 %
Lymphs Abs: 1.8 10*3/uL (ref 0.7–4.0)
MCH: 28.6 pg (ref 26.0–34.0)
MCHC: 32.3 g/dL (ref 30.0–36.0)
MCV: 88.7 fL (ref 80.0–100.0)
Monocytes Absolute: 0.9 10*3/uL (ref 0.1–1.0)
Monocytes Relative: 8 %
Neutro Abs: 8.2 10*3/uL — ABNORMAL HIGH (ref 1.7–7.7)
Neutrophils Relative %: 76 %
Platelets: 213 10*3/uL (ref 150–400)
RBC: 4.61 MIL/uL (ref 3.87–5.11)
RDW: 13.2 % (ref 11.5–15.5)
WBC: 11 10*3/uL — ABNORMAL HIGH (ref 4.0–10.5)
nRBC: 0 % (ref 0.0–0.2)

## 2022-10-04 LAB — COMPREHENSIVE METABOLIC PANEL
ALT: 13 U/L (ref 0–44)
AST: 17 U/L (ref 15–41)
Albumin: 4 g/dL (ref 3.5–5.0)
Alkaline Phosphatase: 69 U/L (ref 38–126)
Anion gap: 8 (ref 5–15)
BUN: 6 mg/dL — ABNORMAL LOW (ref 8–23)
CO2: 26 mmol/L (ref 22–32)
Calcium: 8.9 mg/dL (ref 8.9–10.3)
Chloride: 103 mmol/L (ref 98–111)
Creatinine, Ser: 0.57 mg/dL (ref 0.44–1.00)
GFR, Estimated: >60 mL/min (ref >60)
Glucose, Bld: 93 mg/dL (ref 70–99)
Potassium: 3.2 mmol/L — ABNORMAL LOW (ref 3.5–5.1)
Sodium: 137 mmol/L (ref 135–145)
Total Bilirubin: 1.3 mg/dL — ABNORMAL HIGH (ref 0.3–1.2)
Total Protein: 7.4 g/dL (ref 6.5–8.1)

## 2022-10-04 LAB — URINALYSIS, ROUTINE W REFLEX MICROSCOPIC
Bilirubin Urine: NEGATIVE
Glucose, UA: >=500 mg/dL — AB
Hgb urine dipstick: NEGATIVE
Ketones, ur: 5 mg/dL — AB
Nitrite: NEGATIVE
Protein, ur: NEGATIVE mg/dL
Specific Gravity, Urine: 1.011 (ref 1.005–1.030)
pH: 8 (ref 5.0–8.0)

## 2022-10-04 LAB — LIPASE, BLOOD: Lipase: 23 U/L (ref 11–51)

## 2022-10-04 MED ORDER — POTASSIUM CHLORIDE CRYS ER 20 MEQ PO TBCR
40.0000 meq | EXTENDED_RELEASE_TABLET | Freq: Once | ORAL | Status: AC
  Administered 2022-10-04: 40 meq via ORAL
  Filled 2022-10-04: qty 2

## 2022-10-04 MED ORDER — HYDROMORPHONE HCL 1 MG/ML IJ SOLN
0.5000 mg | Freq: Once | INTRAMUSCULAR | Status: AC
  Administered 2022-10-04: 0.5 mg via INTRAVENOUS
  Filled 2022-10-04: qty 1

## 2022-10-04 MED ORDER — SORBITOL 70 % SOLN
960.0000 mL | TOPICAL_OIL | Freq: Once | ORAL | Status: AC
  Administered 2022-10-04: 960 mL via RECTAL
  Filled 2022-10-04: qty 240

## 2022-10-04 MED ORDER — SODIUM CHLORIDE 0.9 % IV BOLUS: 1000.0000 mL | Freq: Once | INTRAVENOUS | Status: DC

## 2022-10-04 MED ORDER — ACETAMINOPHEN 325 MG PO TABS: 650.0000 mg | ORAL_TABLET | Freq: Four times a day (QID) | ORAL | 0 refills | Status: DC | PRN

## 2022-10-04 MED ORDER — ONDANSETRON HCL 4 MG/2ML IJ SOLN
4.0000 mg | Freq: Once | INTRAMUSCULAR | Status: AC
  Administered 2022-10-04: 4 mg via INTRAVENOUS
  Filled 2022-10-04: qty 2

## 2022-10-04 MED ORDER — SODIUM CHLORIDE 0.9 % IV BOLUS
500.0000 mL | Freq: Once | INTRAVENOUS | Status: AC
  Administered 2022-10-04: 500 mL via INTRAVENOUS

## 2022-10-04 MED ORDER — OXYCODONE HCL 5 MG PO TABS: 5.0000 mg | ORAL_TABLET | Freq: Four times a day (QID) | ORAL | 0 refills | Status: DC | PRN

## 2022-10-04 MED ORDER — IOHEXOL 300 MG/ML  SOLN
100.0000 mL | Freq: Once | INTRAMUSCULAR | Status: AC | PRN
  Administered 2022-10-04: 100 mL via INTRAVENOUS

## 2022-10-04 NOTE — ED Notes (Signed)
Patient transported to CT 

## 2022-10-04 NOTE — Discharge Instructions (Addendum)
Please follow up with your primary care doctor within 2-3 days. For constipation we also recommend a diet high in fiber (beans, fruits, vegetables, whole grains). Take Colace 100-200 mg up to three times per day. You may take along with Senokot 1-2 tabs, ingest with full glass of water.  You may also take MiraLAX 1-2 capfuls 1-2 times a day until stools become soft and then slowly decrease the amount of MiraLAX used.  Please increase fibers in your diet. You may also take Milk of Magnesia 30 mL as needed for constipation, you may repeat in 2 hours again if no bowl movement.   It was a pleasure caring for you today in the emergency department.  Please return to the emergency department for any worsening or worrisome symptoms.

## 2022-10-04 NOTE — ED Provider Notes (Signed)
[place] EMERGENCY DEPARTMENT AT [facility] Provider Note  CSN: [id number] Arrival date & time: 10/04/22 1317  Chief Complaint(s) Abdominal Pain and Constipation  HPI [name] [name] is a 62 y.o. female with past medical history as below, significant for H. pylori gastritis, hypertension, PAF, obesity, COPD, diastolic heart failure, alcohol abuse who presents to the ED with complaint of abdominal pain right lower quadrant.  Patient reports she is recently recovered from shingles to her right side; rash has resolved.  On Thursday evening she began to have right lower quad abdominal pain, no nausea or vomiting.  Pain is sharp and stabbing.  Radiates to her right flank.  Does not appear to be in same location as her recent shingles rash.  Has not had bowel movement approximately 1.5 weeks.  Usually has 2 bowel movements weekly.  She did try taking mag citrate just prior to arrival to the emergency department which did not produce a bowel movement.  No changes to her urination.  No abnormal vaginal bleeding or discharge.  No chest pain or dyspnea.  No recent medication or diet changes.  No recent travel.  No trauma.  No extremity weakness.  Past Medical History Past Medical History:  Diagnosis Date   Asthma    Bloody stool    CHF (congestive heart failure) (HCC)    Chronic diastolic heart failure (HCC)    Dental decay    Gastritis, Helicobacter pylori [id number]   Hypertension    Paroxysmal atrial fibrillation ([name])    Pneumonia 06/21/2015   Prepyloric ulcer 06/22/2015   Seasonal allergies    Shortness of breath dyspnea    with exertion   Patient Active Problem List   Diagnosis Date Noted   Severe sleep apnea 05/06/2022   Acute on chronic respiratory failure with hypoxia and hypercapnia (HCC) 03/31/2022   Nocturnal hypoxia 01/01/2022   Esophageal candidiasis ([place]) 11/04/2021   Bandemia 10/31/2021   Hyponatremia 10/31/2021   Obesity, Class III, BMI 40-49.9 (morbid obesity)  ([place]) 10/30/2021   Euthyroid sick syndrome 10/30/2021   COPD with acute exacerbation ([place]) 10/27/2021   Paroxysmal atrial fibrillation ([place]) 10/27/2021   Hypertension    Acute on chronic diastolic CHF (congestive heart failure) (HCC)    Iron deficiency anemia    Acute on chronic respiratory failure with hypoxia ([place]) 05/14/2021   Snoring 03/28/2021   Acute respiratory failure ([place]) 03/19/2021   Chronic left hip pain 03/12/2021   Chronic left shoulder pain 03/12/2021   Mild intermittent asthma without complication [id number]   Lump of axilla, left 03/12/2021   Anxiety with depression 10/06/2017   Healthcare maintenance 10/06/2017   Osteoarthritis, multiple sites 10/06/2017   Alcohol use disorder 07/06/2015   Prepyloric ulcer 06/22/2015   Elevated blood pressure reading with diagnosis of hypertension 04/14/2015   Seasonal allergies 04/14/2015   Home Medication(s) Prior to Admission medications   Medication Sig Start Date End Date Taking? Authorizing Provider  acetaminophen (TYLENOL) 325 MG tablet Take 2 tablets (650 mg total) by mouth every 6 (six) hours as needed. 10/04/22  Yes Wynona Dove A, DO  oxyCODONE (ROXICODONE) 5 MG immediate release tablet Take 1 tablet (5 mg total) by mouth every 6 (six) hours as needed for severe pain. 10/04/22  Yes Wynona Dove A, DO  albuterol (VENTOLIN HFA) 108 (90 Base) MCG/ACT inhaler INHALE 2 PUFFS INTO THE LUNGS EVERY 6 (SIX) HOURS AS NEEDED FOR WHEEZING OR SHORTNESS OF BREATH. 09/10/22   Kerin Perna, NP  atorvastatin (LIPITOR) 20  MG tablet TAKE 1 TABLET (20 MG TOTAL) BY MOUTH AT BEDTIME FOR CHOLESTEROL 05/27/22   Tolia, Sunit, DO  Blood Pressure Monitor KIT 1 kit by Does not apply route 3 (three) times daily as needed. 09/01/19   Kerin Perna, NP  BREO ELLIPTA 200-25 MCG/ACT AEPB Inhale 1 puff into the lungs daily. 03/26/22   [provider]  cholecalciferol (VITAMIN D3) 25 MCG (1000 UNIT) tablet Take 1,000 Units by mouth daily.     [provider]  ELIQUIS 5 MG TABS tablet TAKE 1 TABLET (5 MG TOTAL) BY MOUTH 2 (TWO) TIMES DAILY. 09/24/22   Tolia, Sunit, DO  FARXIGA 10 MG TABS tablet TAKE 1 TABLET (10 MG TOTAL) BY MOUTH DAILY. 03/14/22   Tolia, Sunit, DO  hydrochlorothiazide (HYDRODIURIL) 25 MG tablet Take 25 mg by mouth daily. 01/11/22   [provider]  metoprolol succinate (TOPROL-XL) 50 MG 24 hr tablet Take 1 tablet (50 mg total) by mouth daily. Take with or immediately following a meal. 11/20/21   Tolia, Sunit, DO  OXYGEN Inhale 2 L/min into the lungs continuous.    [provider]  pantoprazole (PROTONIX) 40 MG tablet TAKE 1 TABLET (40 MG TOTAL) BY MOUTH DAILY FOR ACID REFLUX 09/24/22   Tolia, Sunit, DO  potassium chloride SA (KLOR-CON M) 20 MEQ tablet Take 1 tablet (20 mEq total) by mouth daily for 3 days. 04/03/22 04/06/22  Caren Griffins, MD  thiamine 100 MG tablet Take 1 tablet (100 mg total) by mouth daily. 03/26/21   Oswald Hillock, MD  torsemide (DEMADEX) 10 MG tablet TAKE 1/2 TABLET (5 MG TOTAL) BY MOUTH DAILY. Patient taking differently: Take 10 mg by mouth daily. 03/14/22   Tolia, Sunit, DO  traZODone (DESYREL) 50 MG tablet Take 1 tablet (50 mg total) by mouth at bedtime. 03/11/21   Mayers, Loraine Grip, PA-C                                                                                                                                    Past Surgical History Past Surgical History:  Procedure Laterality Date   BARTHOLIN CYST MARSUPIALIZATION Left 1997   BIOPSY  11/04/2021   Procedure: BIOPSY;  Surgeon: Ronnette Juniper, MD;  Location: [facility];  Service: Gastroenterology;;   BUBBLE STUDY  10/31/2021   Procedure: BUBBLE STUDY;  Surgeon: Rex Kras, DO;  Location: [place];  Service: Cardiovascular;;   [place]   Repair of PFO   CARDIOVERSION N/A 10/31/2021   Procedure: CARDIOVERSION;  Surgeon: Rex Kras, DO;  Location: [place];  Service: Cardiovascular;  Laterality: N/A;    COLONOSCOPY N/A 2002   Performed secondary to mother's diagnosis of colon CA at age 27   COLONOSCOPY WITH PROPOFOL N/A 11/04/2021   Procedure: COLONOSCOPY WITH PROPOFOL;  Surgeon: Ronnette Juniper, MD;  Location: [facility];  Service: Gastroenterology;  Laterality: N/A;   ESOPHAGOGASTRODUODENOSCOPY (EGD) WITH  PROPOFOL N/A 06/22/2015   Procedure: ESOPHAGOGASTRODUODENOSCOPY (EGD) WITH PROPOFOL;  Surgeon: Arta Silence, MD;  Location[facility] ENDOSCOPY;  Service: Endoscopy;  Laterality: N/A;   ESOPHAGOGASTRODUODENOSCOPY (EGD) WITH PROPOFOL N/A 11/04/2021   Procedure: ESOPHAGOGASTRODUODENOSCOPY (EGD) WITH PROPOFOL;  Surgeon: Ronnette Juniper, MD;  Location: [facility];  Service: Gastroenterology;  Laterality: N/A;   FRACTURE SURGERY     NASAL SINUS SURGERY Bilateral 1984   OVARIAN CYST REMOVAL Left 1990   POLYPECTOMY  11/04/2021   Procedure: POLYPECTOMY;  Surgeon: Ronnette Juniper, MD;  Location: [facility];  Service: Gastroenterology;;   TEE WITHOUT CARDIOVERSION N/A 10/31/2021   Procedure: TRANSESOPHAGEAL ECHOCARDIOGRAM (TEE);  Surgeon: Rex Kras, DO;  Location: [facility]OS[facility];  Service: Cardiovascular;  Laterality: N/A;   TUBAL LIGATION     UMBILICAL HERNIA REPAIR N/A 2005   Family History Family History  Problem Relation Age of Onset   Colon cancer Mother 64   Hypertension Mother    Hypertension Father    Cerebral aneurysm Sister    Goiter Sister    Breast cancer Neg Hx     Social History Social History   Tobacco Use   Smoking status: Never   Smokeless tobacco: Never  Vaping Use   Vaping Use: Never used  Substance Use Topics   Alcohol use: Not Currently    Comment: previously 3-4 beers per day   Drug use: No   Allergies Sodium ferric gluconate [ferrous gluconate] and Tetracyclines & related  Review of Systems Review of Systems  Constitutional:  Negative for activity change and fever.  HENT:  Negative for facial swelling and trouble swallowing.   Eyes:  Negative for discharge and  redness.  Respiratory:  Negative for cough and shortness of breath.   Cardiovascular:  Negative for chest pain and palpitations.  Gastrointestinal:  Positive for abdominal pain and constipation. Negative for nausea.  Genitourinary:  Negative for dysuria and flank pain.  Musculoskeletal:  Negative for back pain and gait problem.  Skin:  Negative for pallor and rash.  Neurological:  Negative for syncope and headaches.    Physical Exam Vital Signs  I have reviewed the triage vital signs BP 135/75   Pulse 73   Temp 98.7 F (37.1 C) (Oral)   Resp 18   SpO2 94%  Physical Exam Vitals and nursing note reviewed.  Constitutional:      General: She is not in acute distress.    Appearance: Normal appearance. She is obese.  HENT:     Head: Normocephalic and atraumatic.     Right Ear: External ear normal.     Left Ear: External ear normal.     Nose: Nose normal.     Mouth/Throat:     Mouth: Mucous membranes are moist.  Eyes:     General: No scleral icterus.       Right eye: No discharge.        Left eye: No discharge.  Cardiovascular:     Rate and Rhythm: Normal rate and regular rhythm.     Pulses: Normal pulses.     Heart sounds: Normal heart sounds.  Pulmonary:     Effort: Pulmonary effort is normal. No respiratory distress.     Breath sounds: Normal breath sounds.  Abdominal:     General: Abdomen is flat.     Tenderness: There is abdominal tenderness in the right lower quadrant. There is no guarding or rebound. Negative signs include Murphy's sign.  Musculoskeletal:     Right lower leg: No edema.  Left lower leg: No edema.  Skin:    General: Skin is warm and dry.     Capillary Refill: Capillary refill takes less than 2 seconds.  Neurological:     Mental Status: She is alert and oriented to person, place, and time.     GCS: GCS eye subscore is 4. GCS verbal subscore is 5. GCS motor subscore is 6.  Psychiatric:        Mood and Affect: Mood normal.        Behavior:  Behavior normal.     ED Results and Treatments Labs (all labs ordered are listed, but only abnormal results are displayed) Labs Reviewed  COMPREHENSIVE METABOLIC PANEL - Abnormal; Notable for the following components:      Result Value   Potassium 3.2 (*)    BUN 6 (*)    Total Bilirubin 1.3 (*)    All other components within normal limits  CBC WITH DIFFERENTIAL/PLATELET - Abnormal; Notable for the following components:   WBC 11.0 (*)    Neutro Abs 8.2 (*)    All other components within normal limits  URINALYSIS, ROUTINE W REFLEX MICROSCOPIC - Abnormal; Notable for the following components:   Color, Urine STRAW (*)    Glucose, UA >=500 (*)    Ketones, ur 5 (*)    Leukocytes,Ua TRACE (*)    Bacteria, UA RARE (*)    All other components within normal limits  LIPASE, BLOOD                                                                                                                          Radiology CT ABDOMEN PELVIS W CONTRAST  Result Date: 10/04/2022 CLINICAL DATA:  Severe right-sided pain. Patient had shingles recently. EXAM: CT ABDOMEN AND PELVIS WITH CONTRAST TECHNIQUE: Multidetector CT imaging of the abdomen and pelvis was performed using the standard protocol following bolus administration of intravenous contrast. RADIATION DOSE REDUCTION: This exam was performed according to the departmental dose-optimization program which includes automated exposure control, adjustment of the mA and/or kV according to patient size and/or use of iterative reconstruction technique. CONTRAST:  172mL OMNIPAQUE IOHEXOL 300 MG/ML  SOLN COMPARISON:  Abdominal radiograph dated 05/14/2021 and CT abdomen pelvis dated 04/02/2009. FINDINGS: Lower chest: Mild-to-moderate bibasilar atelectasis. The heart is enlarged. Hepatobiliary: No focal liver abnormality is seen. No gallstones, gallbladder wall thickening, or biliary dilatation. Pancreas: Unremarkable. No pancreatic ductal dilatation or surrounding  inflammatory changes. Spleen: Normal in size without focal abnormality. Adrenals/Urinary Tract: Adrenal glands are unremarkable. Chronic deformity/scarring of the inferior pole the right kidney is unchanged since 04/02/2009. There is a 2 mm nonobstructing right renal calculus. No renal calculi or focal lesion is seen on the left. There is no hydronephrosis on either side. Bladder is unremarkable. Stomach/Bowel: Stomach is within normal limits. The ascending and transverse colon are distended with gas, stool, and liquid. No significant bowel wall thickening or inflammatory changes. The appendix appears normal. There is colonic diverticulosis without  evidence of diverticulitis. No evidence of high-grade bowel obstruction. Vascular/Lymphatic: Aortic atherosclerosis. No enlarged abdominal or pelvic lymph nodes. Reproductive: Uterus and bilateral adnexa are unremarkable. Other: There is a small fat containing ventral abdominal wall hernia superior to the umbilicus. No free intraperitoneal fluid. Musculoskeletal: Degenerative changes are seen in the spine. IMPRESSION: 1. The ascending and transverse colon are distended with gas, stool, and liquid is favored to reflect some degree of ileus. Given the presence of fluid within the colon, an infectious colitis is a consideration. No evidence of high-grade bowel obstruction. Aortic Atherosclerosis (ICD10-I70.0). Electronically Signed   By: Zerita Boers M.D.   On: 10/04/2022 16:10    Pertinent labs & imaging results that were available during my care of the patient were reviewed by me and considered in my medical decision making (see MDM for details).  Medications Ordered in ED Medications  iohexol (OMNIPAQUE) 300 MG/ML solution 100 mL (100 mLs Intravenous Contrast Given 10/04/22 1503)  HYDROmorphone (DILAUDID) injection 0.5 mg (0.5 mg Intravenous Given 10/04/22 1538)  ondansetron (ZOFRAN) injection 4 mg (4 mg Intravenous Given 10/04/22 1538)  sodium chloride 0.9 % bolus  500 mL (0 mLs Intravenous Stopped 10/04/22 1631)  sorbitol, milk of mag, mineral oil, glycerin (SMOG) enema (960 mLs Rectal Given 10/04/22 1707)  HYDROmorphone (DILAUDID) injection 0.5 mg (0.5 mg Intravenous Given 10/04/22 1842)  potassium chloride SA (KLOR-CON M) CR tablet 40 mEq (40 mEq Oral Given 10/04/22 1843)                                                                                                                                     Procedures Procedures  (including critical care time)  Medical Decision Making / ED Course    Medical Decision Making:    SONAM KARAFFA is a 62 y.o. female with past medical history as below, significant for H. pylori gastritis, hypertension, PAF, obesity, COPD, diastolic heart failure, alcohol abuse who presents to the ED with complaint of abdominal pain right lower quadrant.. The complaint involves an extensive differential diagnosis and also carries with it a high risk of complications and morbidity.  Serious etiology was considered. Ddx includes but is not limited to: Differential diagnosis includes but is not exclusive to ectopic pregnancy, ovarian cyst, ovarian torsion, acute appendicitis, urinary tract infection, endometriosis, bowel obstruction, hernia, colitis, renal colic, gastroenteritis, volvulus etc.   Complete initial physical exam performed, notably the patient  was pain to right lower quadrant, HDS.  Abdomen is not peritoneal.    Reviewed and confirmed nursing documentation for past medical history, family history, social history.  Vital signs reviewed.    Clinical Course as of 10/04/22 1856  Sat Oct 04, 2022  1619 CT reviewed, concern for ileus, ?infectious colitis, no high degree obstruction or perforation. Ordered smog enema, pt took mag citrate around 1300 [SG]  1839 Potassium(!): 3.2 Replaced orally [SG]  1839 Patient had large bowel movement  following smog enema, pain greatly improved.  Requesting discharge [SG]    Clinical  Course User Index [SG] Jeanell Sparrow, DO   CT reviewed, shows ileus, I have low suspicion for infectious colitis.  Patient with large bowel movement following enema, significant improvement to her pain.  No nausea or vomiting. Tolerating PO   The patient improved significantly and was discharged in stable condition. Detailed discussions were had with the patient regarding current findings, and need for close f/u with PCP or on call doctor. The patient has been instructed to return immediately if the symptoms worsen in any way for re-evaluation. Patient verbalized understanding and is in agreement with current care plan. All questions answered prior to discharge.     Additional history obtained: -Additional history obtained from na -External records from outside source obtained and reviewed including: Chart review including previous notes, labs, imaging, consultation notes including echo, home medications, primary care documentation, prior admission, prior ED visits, home medications  Lab Tests: -I ordered, reviewed, and interpreted labs.   The pertinent results include:   Labs Reviewed  COMPREHENSIVE METABOLIC PANEL - Abnormal; Notable for the following components:      Result Value   Potassium 3.2 (*)    BUN 6 (*)    Total Bilirubin 1.3 (*)    All other components within normal limits  CBC WITH DIFFERENTIAL/PLATELET - Abnormal; Notable for the following components:   WBC 11.0 (*)    Neutro Abs 8.2 (*)    All other components within normal limits  URINALYSIS, ROUTINE W REFLEX MICROSCOPIC - Abnormal; Notable for the following components:   Color, Urine STRAW (*)    Glucose, UA >=500 (*)    Ketones, ur 5 (*)    Leukocytes,Ua TRACE (*)    Bacteria, UA RARE (*)    All other components within normal limits  LIPASE, BLOOD    Notable for as above  EKG   EKG Interpretation  Date/Time:    Ventricular Rate:    PR Interval:    QRS Duration:   QT Interval:    QTC Calculation:   R  Axis:     Text Interpretation:           Imaging Studies ordered: I ordered imaging studies including CT AP I independently visualized the following imaging with scope of interpretation limited to determining acute life threatening conditions related to emergency care: ileus, constipation  I independently visualized and interpreted imaging. I agree with the radiologist interpretation   Medicines ordered and prescription drug management: Meds ordered this encounter  Medications   iohexol (OMNIPAQUE) 300 MG/ML solution 100 mL   HYDROmorphone (DILAUDID) injection 0.5 mg   ondansetron (ZOFRAN) injection 4 mg   DISCONTD: sodium chloride 0.9 % bolus 1,000 mL   sodium chloride 0.9 % bolus 500 mL   sorbitol, milk of mag, mineral oil, glycerin (SMOG) enema   HYDROmorphone (DILAUDID) injection 0.5 mg   potassium chloride SA (KLOR-CON M) CR tablet 40 mEq   oxyCODONE (ROXICODONE) 5 MG immediate release tablet    Sig: Take 1 tablet (5 mg total) by mouth every 6 (six) hours as needed for severe pain.    Dispense:  5 tablet    Refill:  0   acetaminophen (TYLENOL) 325 MG tablet    Sig: Take 2 tablets (650 mg total) by mouth every 6 (six) hours as needed.    Dispense:  36 tablet    Refill:  0    -I have reviewed the  patients home medicines and have made adjustments as needed   Consultations Obtained: na   Cardiac Monitoring: The patient was maintained on a cardiac monitor.  I personally viewed and interpreted the cardiac monitored which showed an underlying rhythm of: NSR  Social Determinants of Health:  Diagnosis or treatment significantly limited by social determinants of health: obesity and alcohol use - prior   Reevaluation: After the interventions noted above, I reevaluated the patient and found that they have resolved  Co morbidities that complicate the patient evaluation  Past Medical History:  Diagnosis Date   Asthma    Bloody stool    CHF (congestive heart failure)  (HCC)    Chronic diastolic heart failure ([place])    Dental decay    Gastritis, Helicobacter pylori [id number]   Hypertension    Paroxysmal atrial fibrillation ([place])    Pneumonia 06/21/2015   Prepyloric ulcer 06/22/2015   Seasonal allergies    Shortness of breath dyspnea    with exertion      Dispostion: Disposition decision including need for hospitalization was considered, and patient discharged from emergency department.    Final Clinical Impression(s) / ED Diagnoses Final diagnoses:  Constipation, unspecified constipation type  Generalized abdominal pain  Hypokalemia     This chart was dictated using voice recognition software.  Despite best efforts to proofread,  errors can occur which can change the documentation meaning.    Jeanell Sparrow, DO 10/04/22 [phone]

## 2022-10-04 NOTE — ED Triage Notes (Signed)
BIBA from home for right side abdominal pain and constipation. Pt recently had shingles and was taking tramadol for pain. Unsure of when she had last bowel movement.

## 2022-10-05 ENCOUNTER — Emergency Department (HOSPITAL_COMMUNITY): Payer: 59

## 2022-10-05 ENCOUNTER — Emergency Department (HOSPITAL_COMMUNITY)
Admission: EM | Admit: 2022-10-05 | Discharge: 2022-10-05 | Disposition: A | Discharge status: Home / Self Care | Payer: 59 | Attending: Emergency Medicine | Admitting: Emergency Medicine

## 2022-10-05 ENCOUNTER — Other Ambulatory Visit: Payer: Self-pay

## 2022-10-05 ENCOUNTER — Encounter (HOSPITAL_COMMUNITY): Payer: Self-pay | Admitting: Emergency Medicine

## 2022-10-05 DIAGNOSIS — R109 Unspecified abdominal pain: Secondary | ICD-10-CM | POA: Diagnosis present

## 2022-10-05 DIAGNOSIS — E876 Hypokalemia: Secondary | ICD-10-CM | POA: Diagnosis not present

## 2022-10-05 DIAGNOSIS — Z7901 Long term (current) use of anticoagulants: Secondary | ICD-10-CM | POA: Insufficient documentation

## 2022-10-05 DIAGNOSIS — I509 Heart failure, unspecified: Secondary | ICD-10-CM | POA: Diagnosis not present

## 2022-10-05 DIAGNOSIS — J449 Chronic obstructive pulmonary disease, unspecified: Secondary | ICD-10-CM | POA: Diagnosis not present

## 2022-10-05 DIAGNOSIS — R1084 Generalized abdominal pain: Secondary | ICD-10-CM | POA: Insufficient documentation

## 2022-10-05 DIAGNOSIS — J45909 Unspecified asthma, uncomplicated: Secondary | ICD-10-CM | POA: Diagnosis not present

## 2022-10-05 LAB — CBC WITH DIFFERENTIAL/PLATELET
Abs Immature Granulocytes: 0.02 10*3/uL (ref 0.00–0.07)
Basophils Absolute: 0 10*3/uL (ref 0.0–0.1)
Basophils Relative: 0 %
Eosinophils Absolute: 0.1 10*3/uL (ref 0.0–0.5)
Eosinophils Relative: 1 %
HCT: 38.5 % (ref 36.0–46.0)
Hemoglobin: 12.6 g/dL (ref 12.0–15.0)
Immature Granulocytes: 0 %
Lymphocytes Relative: 18 %
Lymphs Abs: 1.7 10*3/uL (ref 0.7–4.0)
MCH: 28.8 pg (ref 26.0–34.0)
MCHC: 32.7 g/dL (ref 30.0–36.0)
MCV: 88.1 fL (ref 80.0–100.0)
Monocytes Absolute: 0.9 10*3/uL (ref 0.1–1.0)
Monocytes Relative: 9 %
Neutro Abs: 7.1 10*3/uL (ref 1.7–7.7)
Neutrophils Relative %: 72 %
Platelets: 204 10*3/uL (ref 150–400)
RBC: 4.37 MIL/uL (ref 3.87–5.11)
RDW: 13.2 % (ref 11.5–15.5)
WBC: 9.9 10*3/uL (ref 4.0–10.5)
nRBC: 0 % (ref 0.0–0.2)

## 2022-10-05 LAB — COMPREHENSIVE METABOLIC PANEL
ALT: 12 U/L (ref 0–44)
AST: 17 U/L (ref 15–41)
Albumin: 3.9 g/dL (ref 3.5–5.0)
Alkaline Phosphatase: 63 U/L (ref 38–126)
Anion gap: 11 (ref 5–15)
BUN: 9 mg/dL (ref 8–23)
CO2: 27 mmol/L (ref 22–32)
Calcium: 9.3 mg/dL (ref 8.9–10.3)
Chloride: 99 mmol/L (ref 98–111)
Creatinine, Ser: 0.75 mg/dL (ref 0.44–1.00)
GFR, Estimated: >60 mL/min (ref >60)
Glucose, Bld: 112 mg/dL — ABNORMAL HIGH (ref 70–99)
Potassium: 3.2 mmol/L — ABNORMAL LOW (ref 3.5–5.1)
Sodium: 137 mmol/L (ref 135–145)
Total Bilirubin: 1.3 mg/dL — ABNORMAL HIGH (ref 0.3–1.2)
Total Protein: 7.3 g/dL (ref 6.5–8.1)

## 2022-10-05 LAB — LIPASE, BLOOD: Lipase: 22 U/L (ref 11–51)

## 2022-10-05 MED ORDER — HYDROMORPHONE HCL 1 MG/ML IJ SOLN
1.0000 mg | Freq: Once | INTRAMUSCULAR | Status: AC
  Administered 2022-10-05: 1 mg via INTRAVENOUS
  Filled 2022-10-05: qty 1

## 2022-10-05 MED ORDER — LABETALOL HCL 5 MG/ML IV SOLN
20.0000 mg | Freq: Once | INTRAVENOUS | Status: AC
  Administered 2022-10-05: 20 mg via INTRAVENOUS
  Filled 2022-10-05: qty 4

## 2022-10-05 MED ORDER — POTASSIUM CHLORIDE CRYS ER 20 MEQ PO TBCR: 20.0000 meq | EXTENDED_RELEASE_TABLET | Freq: Every day | ORAL | 0 refills | Status: DC

## 2022-10-05 MED ORDER — DROPERIDOL 2.5 MG/ML IJ SOLN
1.2500 mg | Freq: Once | INTRAMUSCULAR | Status: AC
  Administered 2022-10-05: 1.25 mg via INTRAVENOUS
  Filled 2022-10-05: qty 2

## 2022-10-05 MED ORDER — LACTATED RINGERS IV BOLUS
500.0000 mL | Freq: Once | INTRAVENOUS | Status: AC
  Administered 2022-10-05: 500 mL via INTRAVENOUS

## 2022-10-05 MED ORDER — POTASSIUM CHLORIDE CRYS ER 20 MEQ PO TBCR
40.0000 meq | EXTENDED_RELEASE_TABLET | Freq: Once | ORAL | Status: AC
  Administered 2022-10-05: 40 meq via ORAL
  Filled 2022-10-05: qty 2

## 2022-10-05 MED ORDER — IOHEXOL 300 MG/ML  SOLN: 100.0000 mL | Freq: Once | INTRAMUSCULAR | Status: DC | PRN

## 2022-10-05 NOTE — ED Triage Notes (Signed)
Patient brought in by EMS from home with c/o back pain and ABD pain. Per patient she was seen yesterday for bowel obstruction. Pain 10/10. 122/78 80 100% RA

## 2022-10-05 NOTE — Discharge Instructions (Addendum)
You have been seen today for your complaint of abdominal pain. Your lab work showed low potassium but was otherwise reassuring. Your imaging was declined today against my advice. Your discharge medications include potassium. Take this as prescribed. Home care instructions are as follows:  You may continue to use laxatives to have a bowel movement until you see GI. You may also use Fleet enemas. These are over the counter. Follow directions on the packaging. You should also increase your fiber intake. Follow up with: Dr. Alessandra Bevels. He is a GI doctor. Call tomorrow to schedule an appointment for a follow up visit. You should also follow up with your primary care provider regarding your blood pressure Please seek immediate medical care if you develop any of the following symptoms: Your pain does not go away as soon as your health care provider told you to expect. You cannot stop vomiting. Your pain is only in areas of the abdomen, such as the right side or the left lower portion of the abdomen. Pain on the right side could be caused by appendicitis. You have bloody or black stools, or stools that look like tar. You have severe pain, cramping, or bloating in your abdomen. You have signs of dehydration, such as: Dark urine, very little urine, or no urine. Cracked lips. Dry mouth. Sunken eyes. Sleepiness. Weakness. You have trouble breathing or chest pain. At this time there does not appear to be the presence of an emergent medical condition, however there is always the potential for conditions to change. Please read and follow the below instructions.  Do not take your medicine if  develop an itchy rash, swelling in your mouth or lips, or difficulty breathing; call 911 and seek immediate emergency medical attention if this occurs.  You may review your lab tests and imaging results in their entirety on your MyChart account.  Please discuss all results of fully with your primary care provider and  other specialist at your follow-up visit.  Note: Portions of this text may have been transcribed using voice recognition software. Every effort was made to ensure accuracy; however, inadvertent computerized transcription errors may still be present.

## 2022-10-05 NOTE — ED Provider Notes (Signed)
[place] [facility] Provider Note   CSN: [id number] Arrival date & time: 10/05/22  1837     History  Chief Complaint  Patient presents with   Abdominal Pain   Back Pain    [name] [name] is a 62 y.o. female. With a history of anxiety, depression, asthma, COPD, a fib, anemia, CHF, H pylori who presents to the ED via EMS for evaluation of abdominal pain and back pain. Patient was seen yesterday for similar and had an abdominal pain workup that showed ileus and possible infectious enteritis. An enema was performed and she reported improvement in her symptoms. She continued to have bowel movements through the night. States her pain remained constant despite bowel movements. Returns to the ED due to constant pain. Localizes it to bilateral flanks. Pain was not improved from oxycodone at home. Denies chest pain. Reports shortness of breath at baseline. Denies urinary symptoms. No pelvic pain. Reports that prior to yesterday, she had not had a bowel movement in 1.5 weeks and typically has a bowel movement every 3 to 4 days. She took laxatives at home today without success.  HPI     Home Medications Prior to Admission medications   Medication Sig Start Date End Date Taking? Authorizing Provider  potassium chloride SA (KLOR-CON M) 20 MEQ tablet Take 1 tablet (20 mEq total) by mouth daily for 10 days. 10/05/22 10/15/22 Yes Keyosha Tiedt, [place] Folk, PA-C  acetaminophen (TYLENOL) 325 MG tablet Take 2 tablets (650 mg total) by mouth every 6 (six) hours as needed. 10/04/22   Jeanell Sparrow, DO  albuterol (VENTOLIN HFA) 108 (90 Base) MCG/ACT inhaler INHALE 2 PUFFS INTO THE LUNGS EVERY 6 (SIX) HOURS AS NEEDED FOR WHEEZING OR SHORTNESS OF BREATH. 09/10/22   Kerin Perna, NP  atorvastatin (LIPITOR) 20 MG tablet TAKE 1 TABLET (20 MG TOTAL) BY MOUTH AT BEDTIME FOR CHOLESTEROL 05/27/22   Tolia, Sunit, DO  Blood Pressure Monitor KIT 1 kit by Does not apply route 3 (three)  times daily as needed. 09/01/19   Kerin Perna, NP  BREO ELLIPTA 200-25 MCG/ACT AEPB Inhale 1 puff into the lungs daily. 03/26/22   [provider]  cholecalciferol (VITAMIN D3) 25 MCG (1000 UNIT) tablet Take 1,000 Units by mouth daily.    [provider]  ELIQUIS 5 MG TABS tablet TAKE 1 TABLET (5 MG TOTAL) BY MOUTH 2 (TWO) TIMES DAILY. 09/24/22   Tolia, Sunit, DO  FARXIGA 10 MG TABS tablet TAKE 1 TABLET (10 MG TOTAL) BY MOUTH DAILY. 03/14/22   Tolia, Sunit, DO  hydrochlorothiazide (HYDRODIURIL) 25 MG tablet Take 25 mg by mouth daily. 01/11/22   [provider]  metoprolol succinate (TOPROL-XL) 50 MG 24 hr tablet Take 1 tablet (50 mg total) by mouth daily. Take with or immediately following a meal. 11/20/21   Tolia, Sunit, DO  oxyCODONE (ROXICODONE) 5 MG immediate release tablet Take 1 tablet (5 mg total) by mouth every 6 (six) hours as needed for severe pain. 10/04/22   Wynona Dove A, DO  OXYGEN Inhale 2 L/min into the lungs continuous.    [provider]  pantoprazole (PROTONIX) 40 MG tablet TAKE 1 TABLET (40 MG TOTAL) BY MOUTH DAILY FOR ACID REFLUX 09/24/22   Tolia, Sunit, DO  potassium chloride SA (KLOR-CON M) 20 MEQ tablet Take 1 tablet (20 mEq total) by mouth daily for 3 days. 04/03/22 04/06/22  Caren Griffins, MD  thiamine 100 MG tablet Take 1  tablet (100 mg total) by mouth daily. 03/26/21   Oswald Hillock, MD  torsemide (DEMADEX) 10 MG tablet TAKE 1/2 TABLET (5 MG TOTAL) BY MOUTH DAILY. Patient taking differently: Take 10 mg by mouth daily. 03/14/22   Tolia, Sunit, DO  traZODone (DESYREL) 50 MG tablet Take 1 tablet (50 mg total) by mouth at bedtime. 03/11/21   Mayers, Cari S, PA-C      Allergies    Sodium ferric gluconate [ferrous gluconate] and Tetracyclines & related    Review of Systems   Review of Systems  Gastrointestinal:  Positive for abdominal pain.  All other systems reviewed and are negative.   Physical Exam Updated Vital Signs BP (!) 148/81  (BP Location: Left Arm)   Pulse 85   Temp 97.8 F (36.6 C) (Oral)   Resp 18   Ht 5\' 3"  (1.6 m)   Wt 102.3 kg   SpO2 100%   BMI 39.95 kg/m  Physical Exam Vitals and nursing note reviewed.  Constitutional:      General: She is in acute distress.     Appearance: She is well-developed. She is obese. She is not ill-appearing, toxic-appearing or diaphoretic.  HENT:     Head: Normocephalic and atraumatic.  Eyes:     Conjunctiva/sclera: Conjunctivae normal.  Cardiovascular:     Rate and Rhythm: Normal rate and regular rhythm.     Heart sounds: No murmur heard. Pulmonary:     Effort: Pulmonary effort is normal. No respiratory distress.     Breath sounds: Normal breath sounds.  Abdominal:     Palpations: Abdomen is soft.     Tenderness: There is abdominal tenderness in the right upper quadrant, right lower quadrant, left upper quadrant and left lower quadrant. There is no guarding.  Musculoskeletal:        General: No swelling.     Cervical back: Neck supple.  Skin:    General: Skin is warm and dry.     Capillary Refill: Capillary refill takes less than 2 seconds.  Neurological:     Mental Status: She is alert.  Psychiatric:        Mood and Affect: Mood normal.     ED Results / Procedures / Treatments   Labs (all labs ordered are listed, but only abnormal results are displayed) Labs Reviewed  COMPREHENSIVE METABOLIC PANEL - Abnormal; Notable for the following components:      Result Value   Potassium 3.2 (*)    Glucose, Bld 112 (*)    Total Bilirubin 1.3 (*)    All other components within normal limits  CBC WITH DIFFERENTIAL/PLATELET  LIPASE, BLOOD    EKG None  Radiology CT ABDOMEN PELVIS W CONTRAST  Result Date: 10/04/2022 CLINICAL DATA:  Severe right-sided pain. Patient had shingles recently. EXAM: CT ABDOMEN AND PELVIS WITH CONTRAST TECHNIQUE: Multidetector CT imaging of the abdomen and pelvis was performed using the standard protocol following bolus  administration of intravenous contrast. RADIATION DOSE REDUCTION: This exam was performed according to the departmental dose-optimization program which includes automated exposure control, adjustment of the mA and/or kV according to patient size and/or use of iterative reconstruction technique. CONTRAST:  162mL OMNIPAQUE IOHEXOL 300 MG/ML  SOLN COMPARISON:  Abdominal radiograph dated 05/14/2021 and CT abdomen pelvis dated 04/02/2009. FINDINGS: Lower chest: Mild-to-moderate bibasilar atelectasis. The heart is enlarged. Hepatobiliary: No focal liver abnormality is seen. No gallstones, gallbladder wall thickening, or biliary dilatation. Pancreas: Unremarkable. No pancreatic ductal dilatation or surrounding inflammatory changes. Spleen: Normal in  size without focal abnormality. Adrenals/Urinary Tract: Adrenal glands are unremarkable. Chronic deformity/scarring of the inferior pole the right kidney is unchanged since 04/02/2009. There is a 2 mm nonobstructing right renal calculus. No renal calculi or focal lesion is seen on the left. There is no hydronephrosis on either side. Bladder is unremarkable. Stomach/Bowel: Stomach is within normal limits. The ascending and transverse colon are distended with gas, stool, and liquid. No significant bowel wall thickening or inflammatory changes. The appendix appears normal. There is colonic diverticulosis without evidence of diverticulitis. No evidence of high-grade bowel obstruction. Vascular/Lymphatic: Aortic atherosclerosis. No enlarged abdominal or pelvic lymph nodes. Reproductive: Uterus and bilateral adnexa are unremarkable. Other: There is a small fat containing ventral abdominal wall hernia superior to the umbilicus. No free intraperitoneal fluid. Musculoskeletal: Degenerative changes are seen in the spine. IMPRESSION: 1. The ascending and transverse colon are distended with gas, stool, and liquid is favored to reflect some degree of ileus. Given the presence of fluid  within the colon, an infectious colitis is a consideration. No evidence of high-grade bowel obstruction. Aortic Atherosclerosis (ICD10-I70.0). Electronically Signed   By: Zerita Boers M.D.   On: 10/04/2022 16:10    Procedures Procedures    Medications Ordered in ED Medications  iohexol (OMNIPAQUE) 300 MG/ML solution 100 mL (has no administration in time range)  droperidol (INAPSINE) 2.5 MG/ML injection 1.25 mg (1.25 mg Intravenous Given 10/05/22 1934)  HYDROmorphone (DILAUDID) injection 1 mg (1 mg Intravenous Given 10/05/22 1933)  labetalol (NORMODYNE) injection 20 mg (20 mg Intravenous Given 10/05/22 2053)  lactated ringers bolus 500 mL (0 mLs Intravenous Stopped 10/05/22 2124)  potassium chloride SA (KLOR-CON M) CR tablet 40 mEq (40 mEq Oral Given 10/05/22 2130)    ED Course/ Medical Decision Making/ A&P                             Medical Decision Making Amount and/or Complexity of Data Reviewed Labs: ordered. Radiology: ordered.  Risk Prescription drug management.  This patient presents to the ED for concern of abdominal pain, this involves an extensive number of treatment options, and is a complaint that carries with it a high risk of complications and morbidity. The differential diagnosis for generalized abdominal pain includes, but is not limited to AAA, gastroenteritis, appendicitis, Bowel obstruction, Bowel perforation. Gastroparesis, DKA, Hernia, Inflammatory bowel disease, mesenteric ischemia, pancreatitis, peritonitis SBP, volvulus.  Co morbidities that complicate the patient evaluation   anxiety, depression, asthma, COPD, a fib, anemia, CHF, H pylori  My initial workup includes abdominal pain labs, pain control  Additional history obtained from: Nursing notes from this visit. Previous records within EMR system  ED visit yesterday for same EMS provides a portion of the history  I ordered, reviewed and interpreted labs which include: CBC, CMP, Lipase. Hypokalemia of  3.2 hyperglycemia of 112. Labs otherwise reassuring  I ordered imaging studies including CT abdomen pelvis I independently visualized and interpreted imaging which showed patient declined I agree with the radiologist interpretation  Afebrile, hypertensive but otherwise hemodynamically stable. She is on oxygen at baseline. 62 year old female presenting to the ED for evaluation of abdominal pain. Presented yesterday for same. States the pain has not changed. On exam, she has some TTP in bilateral upper and lower quadrants. No periumbilical, suprapubic or epigastric tenderness. Lab workup overall reassuring. Patient declined CT due to having one yesterday. Her pain was significant improved on reevaluation. Low suspicion for bowel obstruction due  to passing gas today and having bowel movements last night. Low suspicion for infectious etiology due to lack of fever and leukocytosis. Appendicitis/perforation/diverticulitis was ruled out on CT yesterday. CT was ordered to evaluate for change from yesterday, however patient declined. Reasoning for repeat imaging was discussed with patient and she continued to decline. She elected to be discharged against my advice for advanced evaluation. Risks were discussed. She was encouraged to continue taking laxatives in order to have a bowel movement. She was also encouraged to increase dietary fiber and use enemas as needed. She was given contact information for GI and encouraged to follow up. She was also encouraged to see her primary care provider regarding her poorly controlled hypertension. She was given a prescription for potassium supplementation. She was given strict return precautions. Stable at discharge.  At this time the patient appears stable for discharge with appropriate outpatient follow up. Diagnosis was discussed with patient who verbalizes understanding of care plan and is agreeable to discharge. I have discussed return precautions with patient who  verbalizes understanding. Patient encouraged to follow-up with their PCP within 1 week and GI as soon as possible. All questions answered.  Patient's case discussed with Dr. Ashok Cordia who agrees with plan to discharge with follow-up.   Note: Portions of this report may have been transcribed using voice recognition software. Every effort was made to ensure accuracy; however, inadvertent computerized transcription errors may still be present.         Final Clinical Impression(s) / ED Diagnoses Final diagnoses:  Generalized abdominal pain  Hypokalemia    Rx / DC Orders ED Discharge Orders          Ordered    potassium chloride SA (KLOR-CON M) 20 MEQ tablet  Daily        10/05/22 2141              Nehemiah Massed 10/05/22 2155    Lajean Saver, MD 10/05/22 2221

## 2022-11-13 ENCOUNTER — Ambulatory Visit: Payer: 59

## 2022-11-13 DIAGNOSIS — I5033 Acute on chronic diastolic (congestive) heart failure: Secondary | ICD-10-CM

## 2022-11-13 DIAGNOSIS — I480 Paroxysmal atrial fibrillation: Secondary | ICD-10-CM

## 2022-11-21 ENCOUNTER — Encounter: Payer: Self-pay | Admitting: Cardiology

## 2022-11-21 ENCOUNTER — Ambulatory Visit: Payer: 59 | Admitting: Cardiology

## 2022-11-21 VITALS — BP 161/85 | HR 56 | Resp 17 | Ht 63.0 in | Wt 200.6 lb

## 2022-11-21 DIAGNOSIS — I5032 Chronic diastolic (congestive) heart failure: Secondary | ICD-10-CM

## 2022-11-21 DIAGNOSIS — Z7901 Long term (current) use of anticoagulants: Secondary | ICD-10-CM

## 2022-11-21 DIAGNOSIS — Z01810 Encounter for preprocedural cardiovascular examination: Secondary | ICD-10-CM

## 2022-11-21 DIAGNOSIS — E782 Mixed hyperlipidemia: Secondary | ICD-10-CM

## 2022-11-21 DIAGNOSIS — I480 Paroxysmal atrial fibrillation: Secondary | ICD-10-CM

## 2022-11-21 MED ORDER — ISOSORB DINITRATE-HYDRALAZINE 20-37.5 MG PO TABS: 1.0000 | ORAL_TABLET | Freq: Three times a day (TID) | ORAL | 0 refills | Status: AC

## 2022-11-21 NOTE — Progress Notes (Signed)
ID:  [name] [name], DOB Apr 05, 1961, MRN [id number]  PCP:  Sharmon Revere, MD  Cardiologist:  Tessa Lerner, DO, [facility] (established care October 28, 2021)  Date: 11/21/22 Last Office Visit: Nov 19, 2021  Chief Complaint  Patient presents with   Congestive Heart Failure   Atrial Fibrillation   Follow-up    1 year    HPI  [name] [name] is a 62 y.o. African-American female whose past medical history and cardiovascular risk factors include: Paroxysmal atrial fibrillation status post direct-current cardioversion, Hypertension, asthma, heart failure with improved EF / HFpEF,  sleep apnea not on CPAP, COPD, history of alcohol abuse, postmenopausal female.  Patient is being followed by the practice for paroxysmal atrial fibrillation and HFpEF.  Last seen in the office in May 2023 during which time her heart failure medications were refilled/uptitrated.  She was recommended to come back in 6 weeks and has failed to follow-up.  In the past, she was hospitalized for A-fib with RVR underwent direct-current cardioversion to normal sinus rhythm.  Post cardioversion her LVEF had improved from 25-30% to 60 to 65% with grade 2 diastolic dysfunction and elevated left atrial pressure.  In addition, during her hospitalization she was noted to have NSVT nocturnally was recommended to have sleep study as outpatient.  Patient states that she has been diagnosed with sleep apnea but currently not on device therapy and states " I don't think I have it."  Patient has not been following up regularly for up titration of GDMT presents today for preoperative risk stratification for upcoming right hip replacement.  The date of surgery is to be determined and she will be having it with Dr. Linna Caprice.  She denies anginal chest pain or heart failure symptoms.  She uses oxygen on an as-needed basis.  As noted above she is currently not on device therapy for underlying sleep apnea.  When she checks her blood pressure at her  discretion.  Patient is a nondiabetic, no significant CKD, no prior history of CVA/TIA.  Patient is no longer an alcoholic and has remained sober since last visit.  ALLERGIES: Allergies  Allergen Reactions   Sodium Ferric Gluconate [Ferrous Gluconate] Other (See Comments)    Patient had near syncope with abdominal pain and diaphoresis about an hour after IV ferric gluconate although it was while she was on commode having bowel movements   Tetracyclines & Related Itching    MEDICATION LIST PRIOR TO VISIT: Current Meds  Medication Sig   acetaminophen (TYLENOL) 325 MG tablet Take 2 tablets (650 mg total) by mouth every 6 (six) hours as needed.   albuterol (VENTOLIN HFA) 108 (90 Base) MCG/ACT inhaler INHALE 2 PUFFS INTO THE LUNGS EVERY 6 (SIX) HOURS AS NEEDED FOR WHEEZING OR SHORTNESS OF BREATH.   atorvastatin (LIPITOR) 20 MG tablet TAKE 1 TABLET (20 MG TOTAL) BY MOUTH AT BEDTIME FOR CHOLESTEROL   Blood Pressure Monitor KIT 1 kit by Does not apply route 3 (three) times daily as needed.   BREO ELLIPTA 200-25 MCG/ACT AEPB Inhale 1 puff into the lungs daily.   ELIQUIS 5 MG TABS tablet TAKE 1 TABLET (5 MG TOTAL) BY MOUTH 2 (TWO) TIMES DAILY.   FARXIGA 10 MG TABS tablet TAKE 1 TABLET (10 MG TOTAL) BY MOUTH DAILY.   hydrochlorothiazide (HYDRODIURIL) 25 MG tablet Take 25 mg by mouth daily.   isosorbide-hydrALAZINE (BIDIL) 20-37.5 MG tablet Take 1 tablet by mouth 3 (three) times daily.   metoprolol succinate (TOPROL-XL) 50 MG 24 hr  tablet Take 1 tablet (50 mg total) by mouth daily. Take with or immediately following a meal.   OXYGEN Inhale 2 L/min into the lungs continuous.   pantoprazole (PROTONIX) 40 MG tablet TAKE 1 TABLET (40 MG TOTAL) BY MOUTH DAILY FOR ACID REFLUX   sacubitril-valsartan (ENTRESTO) 49-51 MG Take 1 tablet by mouth 2 (two) times daily.   thiamine 100 MG tablet Take 1 tablet (100 mg total) by mouth daily.   torsemide (DEMADEX) 10 MG tablet TAKE 1/2 TABLET (5 MG TOTAL) BY MOUTH  DAILY. (Patient taking differently: Take 10 mg by mouth daily.)   traZODone (DESYREL) 50 MG tablet Take 1 tablet (50 mg total) by mouth at bedtime.     PAST MEDICAL HISTORY: Past Medical History:  Diagnosis Date   Asthma    Bloody stool    CHF (congestive heart failure) (HCC)    Chronic diastolic heart failure (HCC)    Dental decay    Gastritis, Helicobacter pylori 06/22/2015   Hypertension    Paroxysmal atrial fibrillation (HCC)    Pneumonia 06/21/2015   Prepyloric ulcer 06/22/2015   Seasonal allergies    Shortness of breath dyspnea    with exertion    PAST SURGICAL HISTORY: Past Surgical History:  Procedure Laterality Date   BARTHOLIN CYST MARSUPIALIZATION Left 1997   BIOPSY  11/04/2021   Procedure: BIOPSY;  Surgeon: Kerin Salen, MD;  Location: [facility];  Service: Gastroenterology;;   BUBBLE STUDY  10/31/2021   Procedure: BUBBLE STUDY;  Surgeon: Tessa Lerner, DO;  Location: [facility];  Service: Cardiovascular;;   CARDIAC SURGERY N/A 1963   Repair of PFO   CARDIOVERSION N/A 10/31/2021   Procedure: CARDIOVERSION;  Surgeon: Tessa Lerner, DO;  Location: [facility];  Service: Cardiovascular;  Laterality: N/A;   COLONOSCOPY N/A 2002   Performed secondary to mother's diagnosis of colon CA at age 92   COLONOSCOPY WITH PROPOFOL N/A 11/04/2021   Procedure: COLONOSCOPY WITH PROPOFOL;  Surgeon: Kerin Salen, MD;  Location: [facility];  Service: Gastroenterology;  Laterality: N/A;   ESOPHAGOGASTRODUODENOSCOPY (EGD) WITH PROPOFOL N/A 06/22/2015   Procedure: ESOPHAGOGASTRODUODENOSCOPY (EGD) WITH PROPOFOL;  Surgeon: Willis Modena, MD;  Location: [facility] ENDOSCOPY;  Service: Endoscopy;  Laterality: N/A;   ESOPHAGOGASTRODUODENOSCOPY (EGD) WITH PROPOFOL N/A 11/04/2021   Procedure: ESOPHAGOGASTRODUODENOSCOPY (EGD) WITH PROPOFOL;  Surgeon: Kerin Salen, MD;  Location: [facility];  Service: Gastroenterology;  Laterality: N/A;   FRACTURE SURGERY     NASAL SINUS SURGERY Bilateral 1984    OVARIAN CYST REMOVAL Left 1990   POLYPECTOMY  11/04/2021   Procedure: POLYPECTOMY;  Surgeon: Kerin Salen, MD;  Location: [facility];  Service: Gastroenterology;;   TEE WITHOUT CARDIOVERSION N/A 10/31/2021   Procedure: TRANSESOPHAGEAL ECHOCARDIOGRAM (TEE);  Surgeon: Tessa Lerner, DO;  Location: [facility];  Service: Cardiovascular;  Laterality: N/A;   TUBAL LIGATION     UMBILICAL HERNIA REPAIR N/A 2005    FAMILY HISTORY: The patient family history includes Cerebral aneurysm in her sister; Colon cancer (age of onset: 27) in her mother; Goiter in her sister; Hypertension in her father and mother.  SOCIAL HISTORY:  The patient  reports that she has never smoked. She has never used smokeless tobacco. She reports that she does not currently use alcohol. She reports that she does not use drugs.  REVIEW OF SYSTEMS: Review of Systems  Cardiovascular:  Positive for dyspnea on exertion. Negative for chest pain, leg swelling, near-syncope, palpitations, paroxysmal nocturnal dyspnea and syncope.  Respiratory:  Positive for shortness of breath.  Hematologic/Lymphatic: Negative for bleeding problem.    PHYSICAL EXAM:    11/21/2022    9:37 AM 10/05/2022    9:50 PM 10/05/2022    8:52 PM  Vitals with BMI  Height 5\' 3"     Weight 200 lbs 10 oz    BMI 35.54    Systolic 161 148 161  Diastolic 85 81 100  Pulse 56 85 86    CONSTITUTIONAL: Age-appropriate, hemodynamically stable, well-developed and well-nourished. No acute distress.  SKIN: Skin is warm and dry. No rash noted. No cyanosis. No pallor. No jaundice HEAD: Normocephalic and atraumatic.  EYES: No scleral icterus MOUTH/THROAT: Moist oral membranes.  NECK: No JVD present. No thyromegaly noted. No carotid bruits  LYMPHATIC: No visible cervical adenopathy.  CHEST Normal respiratory effort. No intercostal retractions  LUNGS: Clear to auscultation bilaterally.  No stridor. No wheezes. No rales.  CARDIOVASCULAR: Regular rate and rhythm,  positive S1-S2, no murmurs rubs or gallops appreciated. ABDOMINAL: Obese, soft, nontender, nondistended, positive bowel sounds in all 4 quadrants, no apparent ascites.  EXTREMITIES: No peripheral edema, warm to touch, 2+ bilateral DP and PT pulses HEMATOLOGIC: No significant bruising NEUROLOGIC: Oriented to person, place, and time. Nonfocal. Normal muscle tone.  PSYCHIATRIC: Normal mood and affect. Normal behavior. Cooperative  CARDIAC DATABASE: EKG: Nov 21, 2022: Sinus bradycardia, 55 bpm, LAE, nonspecific ST-T changes.   Echocardiogram: 03/19/2021: LVEF 60-65%, moderate LVH, grade 2 diastolic dysfunction, elevated LAP, moderately dilated left atrium, mild MR, mild AR, estimated RAP 15 mmHg.   10/31/2020 TEE + DDCV: LVEF 25-30%, severely reduced, mild LVH, regional wall motion abnormality, RV function low normal, size is normal, no left atrial appendage thrombi, mild to moderate MR, mild plaque in the descending/ascending aorta.  Bubble study negative.  EKG during visit TEE A-fib with RVR.  Underwent direct-current cardioversion 200 J synchronized x1 restored NSR.   11/01/2020:  1. Left ventricular ejection fraction, by estimation, is 50 to 55%. The  left ventricle has low normal function. The left ventricle has no regional  wall motion abnormalities. There is mild left ventricular hypertrophy.  Left ventricular diastolic  parameters are consistent with Grade III diastolic dysfunction  (restrictive). Elevated left atrial pressure.   2. Right ventricular systolic function is normal. The right ventricular  size is normal. There is mildly elevated pulmonary artery systolic  pressure.   3. Left atrial size was mildly dilated.   4. The mitral valve is grossly normal. Mild mitral valve regurgitation.  No evidence of mitral stenosis.   5. The aortic valve is grossly normal. Aortic valve regurgitation is  mild. No aortic stenosis is present.   6. The inferior vena cava is normal in size with  greater than 50%  respiratory variability, suggesting right atrial pressure of 3 mmHg. 7. While in NSR.    Stress Testing: Regadenoson Nuclear stress test 11/13/2022: Normal myocardial perfusion with mild apical thinning. Overall LV systolic function is normal without regional wall motion abnormalities. Stress LV EF: 59%.  Non-diagnostic ECG stress. The heart rate response was consistent with Regadenoson.  No previous exam available for comparison. Low risk.   Heart Catheterization: None  LABORATORY DATA:    Latest Ref Rng & Units 10/05/2022    7:37 PM 10/04/2022    1:55 PM 04/01/2022    3:45 AM  CBC  WBC 4.0 - 10.5 K/uL 9.9  11.0  7.8   Hemoglobin 12.0 - 15.0 g/dL 09.6  04.5  40.9   Hematocrit 36.0 - 46.0 % 38.5  40.9  32.6   Platelets 150 - 400 K/uL 204  213  271        Latest Ref Rng & Units 10/05/2022    7:37 PM 10/04/2022    1:55 PM 04/03/2022    4:13 AM  CMP  Glucose 70 - 99 mg/dL 161  93  096   BUN 8 - 23 mg/dL 9  6  20    Creatinine 0.44 - 1.00 mg/dL 0.45  4.09  8.11   Sodium 135 - 145 mmol/L 137  137  140   Potassium 3.5 - 5.1 mmol/L 3.2  3.2  3.2   Chloride 98 - 111 mmol/L 99  103  98   CO2 22 - 32 mmol/L 27  26  35   Calcium 8.9 - 10.3 mg/dL 9.3  8.9  8.9   Total Protein 6.5 - 8.1 g/dL 7.3  7.4    Total Bilirubin 0.3 - 1.2 mg/dL 1.3  1.3    Alkaline Phos 38 - 126 U/L 63  69    AST 15 - 41 U/L 17  17    ALT 0 - 44 U/L 12  13      Lipid Panel  Lab Results  Component Value Date   CHOL 181 08/22/2021   HDL 58 08/22/2021   LDLCALC 102 (H) 08/22/2021   TRIG 118 08/22/2021   CHOLHDL 3.1 08/22/2021     No components found for: "NTPROBNP" No results for input(s): "PROBNP" in the last 8760 hours. No results for input(s): "TSH" in the last 8760 hours.   BMP Recent Labs    04/03/22 0413 10/04/22 1355 10/05/22 1937  NA 140 137 137  K 3.2* 3.2* 3.2*  CL 98 103 99  CO2 35* 26 27  GLUCOSE 126* 93 112*  BUN 20 6* 9  CREATININE 0.74 0.57 0.75  CALCIUM  8.9 8.9 9.3  GFRNONAA >60 >60 >60    HEMOGLOBIN A1C Lab Results  Component Value Date   HGBA1C 5.6 10/30/2021   MPG 114.02 10/30/2021    IMPRESSION:    ICD-10-CM   1. Preop cardiovascular exam  Z01.810     2. Paroxysmal atrial fibrillation (HCC)  I48.0 EKG 12-Lead    3. Long term (current) use of anticoagulants  Z79.01     4. Heart failure with improved ejection fraction (HFimpEF) (HCC)  I50.32     5. Chronic heart failure with preserved ejection fraction (HFpEF) (HCC)  I50.32 isosorbide-hydrALAZINE (BIDIL) 20-37.5 MG tablet    6. Mixed hyperlipidemia  E78.2     7. Class 2 severe obesity due to excess calories with serious comorbidity and body mass index (BMI) of 35.0 to 35.9 in adult [facility])  E66.01    Z68.35        RECOMMENDATIONS: JAILA INGARGIOLA is a 62 y.o. African-American female whose past medical history and cardiac risk factors include: Paroxysmal atrial fibrillation status post direct-current cardioversion, Hypertension, asthma, heart failure with improved EF / HFpEF,  sleep apnea not on CPAP, COPD, history of alcohol abuse, postmenopausal female.  Preop cardiovascular exam Patient is being considered for a right total hip arthroplasty. Date of surgery is pending. Under the care of Dr. Samson Frederic EKG today illustrates sinus bradycardia with nonspecific ST-T changes. LVEF has improved status post cardioversion. Most recent nuclear stress test low risk. She denies any anginal chest pain and her overall heart failure symptoms are well-controlled. Recommend better blood pressure management prior to surgery. Most recent labs from 10/05/2022 independently reviewed-noted  to be hypokalemic.  She will have repeat labs with PCP.  Will defer electrolyte replacement/management to PCP. From cardiovascular standpoint patient is considered acceptable risk for upcoming noncardiac surgery.  Final decision to proceed with surgery will be based on recommendations provided by  orthopedic surgeon and the patient's wishes.  I have deferred her orthopedic questions to her surgeon at today's visit. Prior to surgery I have asked her to hold Eliquis for 2 days and [place] for 3 days.  Paroxysmal atrial fibrillation (HCC) Rate control: Metoprolol. Rhythm control: N/A. Thromboembolic prophylaxis: Eliquis Status post TEE guided cardioversion October 31 2021-200 J x 1 Was on amiodarone in the past close been discontinued due to underlying pulmonary conditions. Monitor for now.  Long term (current) use of anticoagulants Does not endorse evidence of bleeding. Most recent hemoglobin from March 2024 reviewed. Risks, benefits, and alternatives to anticoagulation discussed Patient has also stopped the consumption of alcohol since last office visit.  Heart failure with improved ejection fraction (HFimpEF) (HCC) Chronic heart failure with preserved ejection fraction (HFpEF) (HCC) LVEF has improved status post cardioversion.   However, based on last echo she has grade 2 diastolic dysfunction and elevated left atrial pressures. Given her HFpEF recommend up titration of medical therapy but she is unaware of exactly what doses of medications she is currently taking.  Medication reconciliation attempted but may not be accurate. Given her HFpEF and uncontrolled hypertension and upcoming surgery we will start her on BiDil.  Medication profile discussed. Postsurgery when she is back to baseline would like to further uptitrate her medical therapy to optimize her HFpEF, patient is agreeable. Reemphasized the importance of addressing her sleep apnea.  Patient stated the rate limiting factor is likely the mouthpiece that she was provided.  I have asked her to reach out to her sleep provider to discuss her other options to improve compliance.  FINAL MEDICATION LIST END OF ENCOUNTER: Meds ordered this encounter  Medications   isosorbide-hydrALAZINE (BIDIL) 20-37.5 MG tablet    Sig: Take 1  tablet by mouth 3 (three) times daily.    Dispense:  90 tablet    Refill:  0    Medications Discontinued During This Encounter  Medication Reason   oxyCODONE (ROXICODONE) 5 MG immediate release tablet    potassium chloride SA (KLOR-CON M) 20 MEQ tablet    potassium chloride SA (KLOR-CON M) 20 MEQ tablet      Current Outpatient Medications:    acetaminophen (TYLENOL) 325 MG tablet, Take 2 tablets (650 mg total) by mouth every 6 (six) hours as needed., Disp: 36 tablet, Rfl: 0   albuterol (VENTOLIN HFA) 108 (90 Base) MCG/ACT inhaler, INHALE 2 PUFFS INTO THE LUNGS EVERY 6 (SIX) HOURS AS NEEDED FOR WHEEZING OR SHORTNESS OF BREATH., Disp: 8.5 g, Rfl: 1   atorvastatin (LIPITOR) 20 MG tablet, TAKE 1 TABLET (20 MG TOTAL) BY MOUTH AT BEDTIME FOR CHOLESTEROL, Disp: 30 tablet, Rfl: 0   Blood Pressure Monitor KIT, 1 kit by Does not apply route 3 (three) times daily as needed., Disp: 1 kit, Rfl: 0   BREO ELLIPTA 200-25 MCG/ACT AEPB, Inhale 1 puff into the lungs daily., Disp: , Rfl:    ELIQUIS 5 MG TABS tablet, TAKE 1 TABLET (5 MG TOTAL) BY MOUTH 2 (TWO) TIMES DAILY., Disp: 180 tablet, Rfl: 1   FARXIGA 10 MG TABS tablet, TAKE 1 TABLET (10 MG TOTAL) BY MOUTH DAILY., Disp: 90 tablet, Rfl: 1   hydrochlorothiazide (HYDRODIURIL) 25 MG tablet, Take 25 mg by  mouth daily., Disp: , Rfl:    isosorbide-hydrALAZINE (BIDIL) 20-37.5 MG tablet, Take 1 tablet by mouth 3 (three) times daily., Disp: 90 tablet, Rfl: 0   metoprolol succinate (TOPROL-XL) 50 MG 24 hr tablet, Take 1 tablet (50 mg total) by mouth daily. Take with or immediately following a meal., Disp: 90 tablet, Rfl: 1   OXYGEN, Inhale 2 L/min into the lungs continuous., Disp: , Rfl:    pantoprazole (PROTONIX) 40 MG tablet, TAKE 1 TABLET (40 MG TOTAL) BY MOUTH DAILY FOR ACID REFLUX, Disp: 90 tablet, Rfl: 1   sacubitril-valsartan (ENTRESTO) 49-51 MG, Take 1 tablet by mouth 2 (two) times daily., Disp: , Rfl:    thiamine 100 MG tablet, Take 1 tablet (100 mg total)  by mouth daily., Disp: 30 tablet, Rfl: 0   torsemide (DEMADEX) 10 MG tablet, TAKE 1/2 TABLET (5 MG TOTAL) BY MOUTH DAILY. (Patient taking differently: Take 10 mg by mouth daily.), Disp: 45 tablet, Rfl: 1   traZODone (DESYREL) 50 MG tablet, Take 1 tablet (50 mg total) by mouth at bedtime., Disp: 30 tablet, Rfl: 1   cholecalciferol (VITAMIN D3) 25 MCG (1000 UNIT) tablet, Take 1,000 Units by mouth daily. (Patient not taking: Reported on 11/21/2022), Disp: , Rfl:   Orders Placed This Encounter  Procedures   EKG 12-Lead    Patient Instructions  Hold Farxiga 3 days before before surgery.  Hold Eliquis 2 days before surgery.   --Continue cardiac medications as reconciled in final medication list. --Return in about 3 months (around 02/21/2023) for Follow up, heart failure management.. Or sooner if needed. --Continue follow-up with your primary care physician regarding the management of your other chronic comorbid conditions.  Patient's questions and concerns were addressed to her satisfaction. She voices understanding of the instructions provided during this encounter.   This note was created using a voice recognition software as a result there may be grammatical errors inadvertently enclosed that do not reflect the nature of this encounter. Every attempt is made to correct such errors.  Tessa Lerner, [place], [facility]  Pager:  [phone] Office: [phone]

## 2022-11-21 NOTE — Patient Instructions (Addendum)
Hold Farxiga 3 days before before surgery.  Hold Eliquis 2 days before surgery.

## 2022-11-24 ENCOUNTER — Other Ambulatory Visit: Payer: Self-pay | Admitting: Cardiology

## 2022-12-08 LAB — COMPREHENSIVE METABOLIC PANEL
Albumin: 4.5 (ref 3.5–5.0)
Calcium: 9.8 (ref 8.7–10.7)
eGFR: 100

## 2022-12-08 LAB — BASIC METABOLIC PANEL
BUN: 11 (ref 4–21)
CO2: 26 — AB (ref 13–22)
Chloride: 103 (ref 99–108)
Creatinine: 0.7 (ref 0.5–1.1)
Potassium: 3.9 mEq/L (ref 3.5–5.1)
Sodium: 142 (ref 137–147)

## 2022-12-10 ENCOUNTER — Telehealth: Payer: Self-pay

## 2022-12-10 NOTE — Telephone Encounter (Signed)
Called and informed patient of surgical clearance information. 

## 2022-12-18 ENCOUNTER — Emergency Department (HOSPITAL_COMMUNITY): Payer: 59

## 2022-12-18 ENCOUNTER — Other Ambulatory Visit: Payer: Self-pay

## 2022-12-18 ENCOUNTER — Emergency Department (HOSPITAL_COMMUNITY)
Admission: EM | Admit: 2022-12-18 | Discharge: 2022-12-19 | Disposition: A | Discharge status: Home / Self Care | Payer: 59 | Attending: Emergency Medicine | Admitting: Emergency Medicine

## 2022-12-18 ENCOUNTER — Encounter (HOSPITAL_COMMUNITY): Payer: Self-pay | Admitting: Emergency Medicine

## 2022-12-18 DIAGNOSIS — D649 Anemia, unspecified: Secondary | ICD-10-CM | POA: Diagnosis not present

## 2022-12-18 DIAGNOSIS — E876 Hypokalemia: Secondary | ICD-10-CM | POA: Insufficient documentation

## 2022-12-18 DIAGNOSIS — Z7901 Long term (current) use of anticoagulants: Secondary | ICD-10-CM | POA: Insufficient documentation

## 2022-12-18 DIAGNOSIS — G5622 Lesion of ulnar nerve, left upper limb: Secondary | ICD-10-CM | POA: Diagnosis not present

## 2022-12-18 DIAGNOSIS — R202 Paresthesia of skin: Secondary | ICD-10-CM | POA: Diagnosis present

## 2022-12-18 LAB — BASIC METABOLIC PANEL
Anion gap: 12 (ref 5–15)
BUN: 11 mg/dL (ref 8–23)
CO2: 23 mmol/L (ref 22–32)
Calcium: 9.2 mg/dL (ref 8.9–10.3)
Chloride: 102 mmol/L (ref 98–111)
Creatinine, Ser: 0.79 mg/dL (ref 0.44–1.00)
GFR, Estimated: >60 mL/min (ref >60)
Glucose, Bld: 97 mg/dL (ref 70–99)
Potassium: 3 mmol/L — ABNORMAL LOW (ref 3.5–5.1)
Sodium: 137 mmol/L (ref 135–145)

## 2022-12-18 LAB — CBC
HCT: 34.7 % — ABNORMAL LOW (ref 36.0–46.0)
Hemoglobin: 11.5 g/dL — ABNORMAL LOW (ref 12.0–15.0)
MCH: 28.4 pg (ref 26.0–34.0)
MCHC: 33.1 g/dL (ref 30.0–36.0)
MCV: 85.7 fL (ref 80.0–100.0)
Platelets: 227 10*3/uL (ref 150–400)
RBC: 4.05 MIL/uL (ref 3.87–5.11)
RDW: 14.4 % (ref 11.5–15.5)
WBC: 6.4 10*3/uL (ref 4.0–10.5)
nRBC: 0 % (ref 0.0–0.2)

## 2022-12-18 LAB — TROPONIN I (HIGH SENSITIVITY)
Troponin I (High Sensitivity): 9 ng/L (ref <18)
Troponin I (High Sensitivity): 9 ng/L (ref <18)

## 2022-12-18 MED ORDER — ACETAMINOPHEN 325 MG PO TABS
650.0000 mg | ORAL_TABLET | Freq: Once | ORAL | Status: AC
  Administered 2022-12-18: 650 mg via ORAL
  Filled 2022-12-18: qty 2

## 2022-12-18 MED ORDER — KETOROLAC TROMETHAMINE 30 MG/ML IJ SOLN
15.0000 mg | Freq: Once | INTRAMUSCULAR | Status: AC
  Administered 2022-12-19: 15 mg via INTRAVENOUS
  Filled 2022-12-18: qty 1

## 2022-12-18 NOTE — ED Provider Triage Note (Signed)
Emergency Medicine Provider Triage Evaluation Note  [name] [name] , a 62 y.o. female  was evaluated in triage.  Pt complains of chest pain, paresthesias in left upper extremity.  She also states for the past several days she had headache that slightly worsened today.  Not sudden onset.  Denies history of CVA.  Endorses cardiac history.  Given aspirin en route with EMS.  Review of Systems  Positive: As above Negative: As above  Physical Exam  BP (!) 159/94 (BP Location: Right Arm)   Pulse 62   Temp 97.7 F (36.5 C) (Oral)   Resp 19   Ht 5\' 3"  (1.6 m)   Wt 90.7 kg   SpO2 100%   BMI 35.43 kg/m  Gen:   Awake, no distress   Resp:  Normal effort  MSK:   Moves extremities without difficulty  Other:  No focal neurological deficits on exam.  Medical Decision Making  Medically screening exam initiated at 9:39 PM.  Appropriate orders placed.  KJERSTI DIEBOLD was informed that the remainder of the evaluation will be completed by another provider, this initial triage assessment does not replace that evaluation, and the importance of remaining in the ED until their evaluation is complete.     Marita [place], PA-C 12/18/22 2140

## 2022-12-18 NOTE — ED Triage Notes (Addendum)
Pt arrives via [facility]. Was driving this afternoon when she had a sudden onset of centralized chest pressure along with numbness to left hand while driving. Given 324 ASA and 0.4 SL NTG in route. Pt endorses some improvement of s/s.   EMS reported BP 200/110. Triage BP 159/94 after NTG. Pt sts feeling like she's going to pass out.

## 2022-12-18 NOTE — ED Notes (Signed)
Pt cleared by  PA Karie Mainland for lobby. No additional orders. Pt explained she is awaiting results, room assignment, and to see an emergency department provider. Pt verbalized understanding with no additional questions. Wheelchair to lobby by Charity fundraiser.

## 2022-12-19 DIAGNOSIS — G5622 Lesion of ulnar nerve, left upper limb: Secondary | ICD-10-CM | POA: Diagnosis not present

## 2022-12-19 MED ORDER — POTASSIUM CHLORIDE CRYS ER 20 MEQ PO TBCR
40.0000 meq | EXTENDED_RELEASE_TABLET | Freq: Once | ORAL | Status: AC
  Administered 2022-12-19: 40 meq via ORAL
  Filled 2022-12-19: qty 2

## 2022-12-19 NOTE — ED Provider Notes (Signed)
[place] EMERGENCY DEPARTMENT AT [facility] Provider Note   CSN: [id number] Arrival date & time: 12/18/22  2032     History  Chief Complaint  Patient presents with   Chest Pain    [name] [name] is a 62 y.o. female.  The history is provided by the patient.  Chest Pain Pain location:  Substernal area Pain quality: dull   Pain radiates to:  Does not radiate Pain severity:  Moderate Onset quality:  Gradual Duration: several days. Timing:  Constant Chronicity:  New Context: at rest   Relieved by:  Nothing Worsened by:  Nothing Ineffective treatments:  None tried Associated symptoms: no altered mental status, no anxiety, no cough, no diaphoresis, no dizziness, no fever, no nausea, no palpitations, no shortness of breath, no vomiting and no weakness   Risk factors: not female   No exertional symptoms.  No leg pain no travel.  Also paresthesias on the ulnar digits of the left hand.  No n/v/d.       Home Medications Prior to Admission medications   Medication Sig Start Date End Date Taking? Authorizing Provider  acetaminophen (TYLENOL) 325 MG tablet Take 2 tablets (650 mg total) by mouth every 6 (six) hours as needed. 10/04/22   Sloan Leiter, DO  albuterol (VENTOLIN HFA) 108 (90 Base) MCG/ACT inhaler INHALE 2 PUFFS INTO THE LUNGS EVERY 6 (SIX) HOURS AS NEEDED FOR WHEEZING OR SHORTNESS OF BREATH. 09/10/22   Grayce Sessions, NP  atorvastatin (LIPITOR) 20 MG tablet TAKE 1 TABLET (20 MG TOTAL) BY MOUTH AT BEDTIME FOR CHOLESTEROL 11/24/22   Tolia, Sunit, DO  Blood Pressure Monitor KIT 1 kit by Does not apply route 3 (three) times daily as needed. 09/01/19   Grayce Sessions, NP  BREO ELLIPTA 200-25 MCG/ACT AEPB Inhale 1 puff into the lungs daily. 03/26/22   [provider]  cholecalciferol (VITAMIN D3) 25 MCG (1000 UNIT) tablet Take 1,000 Units by mouth daily. Patient not taking: Reported on 11/21/2022    [provider]  ELIQUIS 5 MG TABS tablet TAKE 1  TABLET (5 MG TOTAL) BY MOUTH 2 (TWO) TIMES DAILY. 09/24/22   Tolia, Sunit, DO  FARXIGA 10 MG TABS tablet TAKE 1 TABLET (10 MG TOTAL) BY MOUTH DAILY. 03/14/22   Tolia, Sunit, DO  hydrochlorothiazide (HYDRODIURIL) 25 MG tablet Take 25 mg by mouth daily. 01/11/22   [provider]  isosorbide-hydrALAZINE (BIDIL) 20-37.5 MG tablet Take 1 tablet by mouth 3 (three) times daily. 11/21/22 12/21/22  Tolia, Sunit, DO  metoprolol succinate (TOPROL-XL) 50 MG 24 hr tablet Take 1 tablet (50 mg total) by mouth daily. Take with or immediately following a meal. 11/20/21   Tolia, Sunit, DO  OXYGEN Inhale 2 L/min into the lungs continuous.    [provider]  pantoprazole (PROTONIX) 40 MG tablet TAKE 1 TABLET (40 MG TOTAL) BY MOUTH DAILY FOR ACID REFLUX 09/24/22   Tolia, Sunit, DO  sacubitril-valsartan (ENTRESTO) 49-51 MG Take 1 tablet by mouth 2 (two) times daily.    [provider]  thiamine 100 MG tablet Take 1 tablet (100 mg total) by mouth daily. 03/26/21   Meredeth Ide, MD  torsemide (DEMADEX) 10 MG tablet TAKE 1/2 TABLET (5 MG TOTAL) BY MOUTH DAILY. Patient taking differently: Take 10 mg by mouth daily. 03/14/22   Tolia, Sunit, DO  traZODone (DESYREL) 50 MG tablet Take 1 tablet (50 mg total) by mouth at bedtime. 03/11/21   Mayers, Kasandra Knudsen, PA-C  Allergies    Sodium ferric gluconate [ferrous gluconate] and Tetracyclines & related    Review of Systems   Review of Systems  Constitutional:  Negative for diaphoresis and fever.  HENT:  Negative for facial swelling.   Eyes:  Negative for redness.  Respiratory:  Negative for cough, shortness of breath, wheezing and stridor.   Cardiovascular:  Positive for chest pain. Negative for palpitations.  Gastrointestinal:  Negative for nausea and vomiting.  Neurological:  Negative for dizziness, facial asymmetry and weakness.  All other systems reviewed and are negative.   Physical Exam Updated Vital Signs BP (!) 148/81   Pulse 63   Temp 97.7 F  (36.5 C) (Oral)   Resp 14   Ht 5\' 3"  (1.6 m)   Wt 90.7 kg   SpO2 100%   BMI 35.43 kg/m  Physical Exam Vitals and nursing note reviewed.  Constitutional:      General: She is not in acute distress.    Appearance: Normal appearance. She is well-developed.  HENT:     Head: Normocephalic and atraumatic.     Nose: Nose normal.  Eyes:     Pupils: Pupils are equal, round, and reactive to light.  Cardiovascular:     Rate and Rhythm: Normal rate and regular rhythm.     Pulses: Normal pulses.     Heart sounds: Normal heart sounds.  Pulmonary:     Effort: Pulmonary effort is normal. No respiratory distress.     Breath sounds: Normal breath sounds.  Abdominal:     General: Bowel sounds are normal. There is no distension.     Palpations: Abdomen is soft.     Tenderness: There is no abdominal tenderness. There is no guarding or rebound.  Genitourinary:    Vagina: No vaginal discharge.  Musculoskeletal:        General: Normal range of motion.     Cervical back: Normal range of motion and neck supple.  Skin:    General: Skin is warm and dry.     Capillary Refill: Capillary refill takes less than 2 seconds.     Findings: No erythema or rash.  Neurological:     General: No focal deficit present.     Mental Status: She is alert and oriented to person, place, and time.     Deep Tendon Reflexes: Reflexes normal.  Psychiatric:        Mood and Affect: Mood normal.     ED Results / Procedures / Treatments   Labs (all labs ordered are listed, but only abnormal results are displayed) Results for orders placed or performed during the hospital encounter of 12/18/22  Basic metabolic panel  Result Value Ref Range   Sodium 137 135 - 145 mmol/L   Potassium 3.0 (L) 3.5 - 5.1 mmol/L   Chloride 102 98 - 111 mmol/L   CO2 23 22 - 32 mmol/L   Glucose, Bld 97 70 - 99 mg/dL   BUN 11 8 - 23 mg/dL   Creatinine, Ser 9.60 0.44 - 1.00 mg/dL   Calcium 9.2 8.9 - 45.4 mg/dL   GFR, Estimated >09 >81  mL/min   Anion gap 12 5 - 15  CBC  Result Value Ref Range   WBC 6.4 4.0 - 10.5 K/uL   RBC 4.05 3.87 - 5.11 MIL/uL   Hemoglobin 11.5 (L) 12.0 - 15.0 g/dL   HCT 19.1 (L) 47.8 - 29.5 %   MCV 85.7 80.0 - 100.0 fL   MCH 28.4 26.0 -  34.0 pg   MCHC 33.1 30.0 - 36.0 g/dL   RDW 14.7 82.9 - 56.2 %   Platelets 227 150 - 400 K/uL   nRBC 0.0 0.0 - 0.2 %  Troponin I (High Sensitivity)  Result Value Ref Range   Troponin I (High Sensitivity) 9 <18 ng/L  Troponin I (High Sensitivity)  Result Value Ref Range   Troponin I (High Sensitivity) 9 <18 ng/L   DG Chest 2 View  Result Date: 12/18/2022 CLINICAL DATA:  Chest pain EXAM: CHEST - 2 VIEW COMPARISON:  03/31/2022, CT 10/29/2021 FINDINGS: No acute airspace disease or pleural effusion. Borderline cardiac enlargement. No pneumothorax. IMPRESSION: No active cardiopulmonary disease. Electronically Signed   By: Jasmine Pang M.D.   On: 12/18/2022 21:31    EKG EKG Interpretation  Date/Time:  Thursday Dec 18 2022 20:55:25 EDT Ventricular Rate:  80 PR Interval:  140 QRS Duration: 96 QT Interval:  410 QTC Calculation: 472 R Axis:   98 Text Interpretation: Normal sinus rhythm Confirmed by Nicanor Alcon, Tassie Pollett ([id number]) on 12/18/2022 11:15:03 PM  Radiology DG Chest 2 View  Result Date: 12/18/2022 CLINICAL DATA:  Chest pain EXAM: CHEST - 2 VIEW COMPARISON:  03/31/2022, CT 10/29/2021 FINDINGS: No acute airspace disease or pleural effusion. Borderline cardiac enlargement. No pneumothorax. IMPRESSION: No active cardiopulmonary disease. Electronically Signed   By: Jasmine Pang M.D.   On: 12/18/2022 21:31    Procedures Procedures    Medications Ordered in ED Medications  potassium chloride SA (KLOR-CON M) CR tablet 40 mEq (has no administration in time range)  acetaminophen (TYLENOL) tablet 650 mg (650 mg Oral Given 12/18/22 2303)  ketorolac (TORADOL) 30 MG/ML injection 15 mg (15 mg Intravenous Given 12/19/22 0008)    ED Course/ Medical Decision Making/ A&P                              Medical Decision Making Patient and chest pain and ulnar digit paresthesias and frontal HA  Amount and/or Complexity of Data Reviewed External Data Reviewed: notes.    Details: Previous notes reviewed  Labs: ordered.    Details: Normal white count 6.4, hemoglobin low 11.5, normal platelets 227. 2 negative troponins 9/9. Normal sodium 137, low potassium 3, normal creatinine .75 Radiology: ordered and independent interpretation performed.    Details: Normal by me  ECG/medicine tests: ordered and independent interpretation performed. Decision-making details documented in ED Course.  Risk OTC drugs. Prescription drug management. Risk Details: Patient with ulnar paresthesias, I instructed patient how to hold hand and to ice and elevate arm.  Follow up with PMD.  Ruled out for MI in the ED.  BP improved in the ED without intervention.  Well appearing stable for discharge.      Final Clinical Impression(s) / ED Diagnoses Final diagnoses:  Ulnar neuropathy at elbow of left upper extremity  Hypokalemia   Return for intractable cough, coughing up blood, fevers > 100.4 unrelieved by medication, shortness of breath, intractable vomiting, chest pain, shortness of breath, weakness, numbness, changes in speech, facial asymmetry, abdominal pain, passing out, Inability to tolerate liquids or food, cough, altered mental status or any concerns. No signs of systemic illness or infection. The patient is nontoxic-appearing on exam and vital signs are within normal limits.  I have reviewed the triage vital signs and the nursing notes. Pertinent labs & imaging results that were available during my care of the patient were reviewed by me and considered in my  medical decision making (see chart for details). After history, exam, and medical workup I feel the patient has been appropriately medically screened and is safe for discharge home. Pertinent diagnoses were discussed with the  patient. Patient was given return precautions.  Rx / DC Orders ED Discharge Orders     None         Dianah Pruett, MD 12/19/22 1610

## 2023-01-10 ENCOUNTER — Encounter: Payer: Self-pay | Admitting: *Deleted

## 2023-01-13 ENCOUNTER — Encounter: Payer: Self-pay | Admitting: Nurse Practitioner

## 2023-01-14 ENCOUNTER — Encounter: Payer: Self-pay | Admitting: Nurse Practitioner

## 2023-01-14 ENCOUNTER — Ambulatory Visit (INDEPENDENT_AMBULATORY_CARE_PROVIDER_SITE_OTHER): Payer: 59 | Admitting: Nurse Practitioner

## 2023-01-14 VITALS — BP 164/110 | HR 89 | Temp 97.1°F | Resp 18 | Ht 63.0 in | Wt 193.0 lb

## 2023-01-14 DIAGNOSIS — F419 Anxiety disorder, unspecified: Secondary | ICD-10-CM

## 2023-01-14 DIAGNOSIS — G4733 Obstructive sleep apnea (adult) (pediatric): Secondary | ICD-10-CM

## 2023-01-14 DIAGNOSIS — F1021 Alcohol dependence, in remission: Secondary | ICD-10-CM | POA: Diagnosis not present

## 2023-01-14 DIAGNOSIS — F330 Major depressive disorder, recurrent, mild: Secondary | ICD-10-CM

## 2023-01-14 DIAGNOSIS — I480 Paroxysmal atrial fibrillation: Secondary | ICD-10-CM | POA: Diagnosis not present

## 2023-01-14 DIAGNOSIS — I5032 Chronic diastolic (congestive) heart failure: Secondary | ICD-10-CM

## 2023-01-14 DIAGNOSIS — E785 Hyperlipidemia, unspecified: Secondary | ICD-10-CM

## 2023-01-14 DIAGNOSIS — M159 Polyosteoarthritis, unspecified: Secondary | ICD-10-CM

## 2023-01-14 DIAGNOSIS — I10 Essential (primary) hypertension: Secondary | ICD-10-CM

## 2023-01-14 MED ORDER — METOPROLOL SUCCINATE ER 25 MG PO TB24: 25.0000 mg | ORAL_TABLET | Freq: Every day | ORAL | 0 refills | Status: DC

## 2023-01-14 MED ORDER — ATORVASTATIN CALCIUM 20 MG PO TABS: ORAL_TABLET | ORAL | 1 refills | Status: DC

## 2023-01-14 MED ORDER — ENTRESTO 24-26 MG PO TABS: 1.0000 | ORAL_TABLET | Freq: Two times a day (BID) | ORAL | 0 refills | Status: DC

## 2023-01-14 MED ORDER — APIXABAN 5 MG PO TABS: 5.0000 mg | ORAL_TABLET | Freq: Two times a day (BID) | ORAL | 1 refills | Status: DC

## 2023-01-14 NOTE — Progress Notes (Signed)
Careteam: Patient Care Team: [name], NP as PCP - General (Geriatric Medicine)  PLACE OF SERVICE:  [facility]  Advanced Directive information Does Patient Have a Medical Advance Directive?: Yes, Type of Advance Directive: Living will, Does patient want to make changes to medical advance directive?: No - Patient declined  Allergies  Allergen Reactions   Sodium Ferric Gluconate [Ferrous Gluconate] Other (See Comments)    Patient had near syncope with abdominal pain and diaphoresis about an hour after IV ferric gluconate although it was while she was on commode having bowel movements   Tetracyclines & Related Itching    Chief Complaint  Patient presents with   Establish Care    Patient is new and here to establish care. Patient has medication bottles at time of initial appointment.     HPI: Patient is a 62 y.o. female to establish care.  She was previously being seen at [facility]. She was on a lot of medication but stopped them all and she has never felt better since.  Wanted a fresh start.  September 2023 stopped drinking ETOH, she would drink multiple drinks a day. Has a better with relationship with children now that she has stopped drinking ETOH.   A fib- she has stopped her eliquis, has not been taking in 1 month.   OA- in hips, knees and shoulder, using tylenol but does not help.  Plans to have hip replacement.   COPD- uses albuterol as needed, once in the last month, during pollen season may use more. Still using breo 1 puff twice- only supposed to be taken daily  Hyperlipidemia- previously on lipitor.   CHF- was previously on hydrochlorothiazide, metoprolol and farxiga  Entresto on her list but she does not feel like she has been on that.   Insomna and anxiety- on trazodone 50 mg daily at bedtime.   S Review of Systems:  Review of Systems  Constitutional:  Negative for chills, fever and weight loss.  HENT:  Negative for tinnitus.    Respiratory:  Negative for cough, sputum production and shortness of breath.   Cardiovascular:  Positive for palpitations. Negative for chest pain and leg swelling.  Gastrointestinal:  Negative for abdominal pain, constipation, diarrhea and heartburn.  Genitourinary:  Negative for dysuria, frequency and urgency.  Musculoskeletal:  Positive for joint pain. Negative for back pain, falls and myalgias.  Skin: Negative.   Neurological:  Negative for dizziness and headaches.  Psychiatric/Behavioral:  Negative for depression and memory loss. The patient does not have insomnia.     Past Medical History:  Diagnosis Date   Asthma    Bloody stool    CHF (congestive heart failure) (HCC)    Chronic diastolic heart failure (HCC)    Dental decay    Gastritis, Helicobacter pylori 06/22/2015   Hypertension    Paroxysmal atrial fibrillation (HCC)    Pneumonia 06/21/2015   Prepyloric ulcer 06/22/2015   Seasonal allergies    Shortness of breath dyspnea    with exertion   Past Surgical History:  Procedure Laterality Date   BARTHOLIN CYST MARSUPIALIZATION Left 1997   BIOPSY  11/04/2021   Procedure: BIOPSY;  Surgeon: Kerin Salen, MD;  Location: [facility];  Service: Gastroenterology;;   Thressa Sheller STUDY  10/31/2021   Procedure: BUBBLE STUDY;  Surgeon: Tessa Lerner, DO;  Location: [facility];  Service: Cardiovascular;;   CARDIAC SURGERY N/A 1963   Repair of PFO   CARDIOVERSION N/A 10/31/2021   Procedure: CARDIOVERSION;  Surgeon: Tessa Lerner, DO;  Location: [facility];  Service: Cardiovascular;  Laterality: N/A;   COLONOSCOPY N/A 2002   Performed secondary to mother's diagnosis of colon CA at age 56   COLONOSCOPY WITH PROPOFOL N/A 11/04/2021   Procedure: COLONOSCOPY WITH PROPOFOL;  Surgeon: Kerin Salen, MD;  Location: [facility];  Service: Gastroenterology;  Laterality: N/A;   ESOPHAGOGASTRODUODENOSCOPY (EGD) WITH PROPOFOL N/A 06/22/2015   Procedure: ESOPHAGOGASTRODUODENOSCOPY (EGD) WITH PROPOFOL;   Surgeon: Willis Modena, MD;  Location: [facility];  Service: Endoscopy;  Laterality: N/A;   ESOPHAGOGASTRODUODENOSCOPY (EGD) WITH PROPOFOL N/A 11/04/2021   Procedure: ESOPHAGOGASTRODUODENOSCOPY (EGD) WITH PROPOFOL;  Surgeon: Kerin Salen, MD;  Location: [facility];  Service: Gastroenterology;  Laterality: N/A;   FRACTURE SURGERY     NASAL SINUS SURGERY Bilateral 1984   OVARIAN CYST REMOVAL Left 1990   POLYPECTOMY  11/04/2021   Procedure: POLYPECTOMY;  Surgeon: Kerin Salen, MD;  Location: [facility];  Service: Gastroenterology;;   TEE WITHOUT CARDIOVERSION N/A 10/31/2021   Procedure: TRANSESOPHAGEAL ECHOCARDIOGRAM (TEE);  Surgeon: Tessa Lerner, DO;  Location: [facility];  Service: Cardiovascular;  Laterality: N/A;   TUBAL LIGATION     UMBILICAL HERNIA REPAIR N/A 2005   Social History:   reports that she has never smoked. She has never used smokeless tobacco. She reports that she does not currently use alcohol. She reports that she does not use drugs.  Family History  Problem Relation Age of Onset   Colon cancer Mother 57   Hypertension Mother    Hypertension Father    Cerebral aneurysm Sister    Goiter Sister    Breast cancer Neg Hx     Medications: Patient's Medications  New Prescriptions   No medications on file  Previous Medications   ACETAMINOPHEN (TYLENOL) 325 MG TABLET    Take 2 tablets (650 mg total) by mouth every 6 (six) hours as needed.   ALBUTEROL (VENTOLIN HFA) 108 (90 BASE) MCG/ACT INHALER    INHALE 2 PUFFS INTO THE LUNGS EVERY 6 (SIX) HOURS AS NEEDED FOR WHEEZING OR SHORTNESS OF BREATH.   ATORVASTATIN (LIPITOR) 20 MG TABLET    TAKE 1 TABLET (20 MG TOTAL) BY MOUTH AT BEDTIME FOR CHOLESTEROL   BLOOD PRESSURE MONITOR KIT    1 kit by Does not apply route 3 (three) times daily as needed.   BREO ELLIPTA 200-25 MCG/ACT AEPB    Inhale 1 puff into the lungs daily.   CHOLECALCIFEROL (VITAMIN D3) 25 MCG (1000 UNIT) TABLET    Take 1,000 Units by mouth daily.   ELIQUIS 5 MG  TABS TABLET    TAKE 1 TABLET (5 MG TOTAL) BY MOUTH 2 (TWO) TIMES DAILY.   FARXIGA 10 MG TABS TABLET    TAKE 1 TABLET (10 MG TOTAL) BY MOUTH DAILY.   HYDROCHLOROTHIAZIDE (HYDRODIURIL) 25 MG TABLET    Take 25 mg by mouth daily.   METOPROLOL SUCCINATE (TOPROL-XL) 50 MG 24 HR TABLET    Take 1 tablet (50 mg total) by mouth daily. Take with or immediately following a meal.   OXYGEN    Inhale 2 L/min into the lungs continuous.   PANTOPRAZOLE (PROTONIX) 40 MG TABLET    TAKE 1 TABLET (40 MG TOTAL) BY MOUTH DAILY FOR ACID REFLUX   SACUBITRIL-VALSARTAN (ENTRESTO) 49-51 MG    Take 1 tablet by mouth 2 (two) times daily.   THIAMINE 100 MG TABLET    Take 1 tablet (100 mg total) by mouth daily.   TORSEMIDE (DEMADEX) 10 MG TABLET  TAKE 1/2 TABLET (5 MG TOTAL) BY MOUTH DAILY.   TRAZODONE (DESYREL) 50 MG TABLET    Take 1 tablet (50 mg total) by mouth at bedtime.  Modified Medications   No medications on file  Discontinued Medications   No medications on file    Physical Exam:  Vitals:   01/14/23 1451 01/14/23 1452  BP: (!) 160/100 (!) 164/110  Pulse: 89   Resp: 18   Temp: (!) 97.1 F (36.2 C)   SpO2: 97%   Weight: 193 lb (87.5 kg)   Height: 5\' 3"  (1.6 m)    Body mass index is 34.19 kg/m. Wt Readings from Last 3 Encounters:  01/14/23 193 lb (87.5 kg)  12/18/22 200 lb (90.7 kg)  11/21/22 200 lb 9.6 oz (91 kg)    Physical Exam Constitutional:      General: She is not in acute distress.    Appearance: She is well-developed. She is not diaphoretic.  HENT:     Head: Normocephalic and atraumatic.     Mouth/Throat:     Pharynx: No oropharyngeal exudate.  Eyes:     Conjunctiva/sclera: Conjunctivae normal.     Pupils: Pupils are equal, round, and reactive to light.  Cardiovascular:     Rate and Rhythm: Normal rate and regular rhythm. Extrasystoles are present.    Heart sounds: Normal heart sounds.  Pulmonary:     Effort: Pulmonary effort is normal.     Breath sounds: Normal breath sounds.   Abdominal:     General: Bowel sounds are normal.     Palpations: Abdomen is soft.  Musculoskeletal:     Cervical back: Normal range of motion and neck supple.     Right lower leg: No edema.     Left lower leg: No edema.  Skin:    General: Skin is warm and dry.  Neurological:     Mental Status: She is alert.  Psychiatric:        Mood and Affect: Mood normal.     Labs reviewed: Basic Metabolic Panel: Recent Labs    04/03/22 0413 10/04/22 1355 10/05/22 1937 12/18/22 2102  NA 140 137 137 137  K 3.2* 3.2* 3.2* 3.0*  CL 98 103 99 102  CO2 35* 26 27 23   GLUCOSE 126* 93 112* 97  BUN 20 6* 9 11  CREATININE 0.74 0.57 0.75 0.79  CALCIUM 8.9 8.9 9.3 9.2  MG 1.8  --   --   --    Liver Function Tests: Recent Labs    03/31/22 1239 10/04/22 1355 10/05/22 1937  AST 24 17 17   ALT 24 13 12   ALKPHOS 98 69 63  BILITOT 1.1 1.3* 1.3*  PROT 7.0 7.4 7.3  ALBUMIN 3.3* 4.0 3.9   Recent Labs    10/04/22 1355 10/05/22 1937  LIPASE 23 22   No results for input(s): "AMMONIA" in the last 8760 hours. CBC: Recent Labs    03/31/22 1239 04/01/22 0345 10/04/22 1355 10/05/22 1937 12/18/22 2102  WBC 11.0*   < > 11.0* 9.9 6.4  NEUTROABS 8.5*  --  8.2* 7.1  --   HGB 9.6*   < > 13.2 12.6 11.5*  HCT 30.9*   < > 40.9 38.5 34.7*  MCV 87.5   < > 88.7 88.1 85.7  PLT 234   < > 213 204 227   < > = values in this interval not displayed.   Lipid Panel: No results for input(s): "CHOL", "HDL", "LDLCALC", "TRIG", "CHOLHDL", "LDLDIRECT" in  the last 8760 hours. TSH: No results for input(s): "TSH" in the last 8760 hours. A1C: Lab Results  Component Value Date   HGBA1C 5.6 10/30/2021     Assessment/Plan 1. Alcohol dependence in remission [facility]) -doing well, continues cessation.   2. OSA (obstructive sleep apnea) Does not use CPAP  3. Paroxysmal atrial fibrillation (HCC) -educated on medication, willing to restart at this time. -previously on metoprolol 50 mg but has been off, will  restart at 25 mg daily at this time and titrate up as needed  - metoprolol succinate (TOPROL-XL) 25 MG 24 hr tablet; Take 1 tablet (25 mg total) by mouth daily. Take with or immediately following a meal.  Dispense: 30 tablet; Refill: 0 - apixaban (ELIQUIS) 5 MG TABS tablet; Take 1 tablet (5 mg total) by mouth 2 (two) times daily.  Dispense: 180 tablet; Refill: 1  4. Mild episode of recurrent major depressive disorder (HCC) Has improved with therapy with lifestyle modifications.   5. Anxiety Continues on trazodone at bedtime, she is also doing therapy which has been beneficial   6. Chronic diastolic CHF (congestive heart failure) (HCC) -euvolemic at this time.  She previously was on farxiga and dexadex and hydrochlorothiazide but has been off these medications for over a month, will hold these medications at this time and encouraged her to restart entresto  - sacubitril-valsartan (ENTRESTO) 24-26 MG; Take 1 tablet by mouth 2 (two) times daily.  Dispense: 60 tablet; Refill: 0  7. Primary hypertension Uncontrolled, will have her restart entresto and metoprolol at this time.  Will have her check bp at home. Goal <140/90  8. Hyperlipidemia LDL goal <70 -agreeable to restart, will restart lipitor in 1 week, to have her start half tablet for 7 days then whole tablet  - atorvastatin (LIPITOR) 20 MG tablet; TAKE 1 TABLET (20 MG TOTAL) BY MOUTH AT BEDTIME FOR CHOLESTEROL  Dispense: 90 tablet; Refill: 1  9. Primary osteoarthritis involving multiple joints Ongoing, using tylenol which has not been effective. Also following with orthopedics and wanting to have hip replacement but needs better blood pressure control.   Return in about 4 weeks (around 02/11/2023) for follow up .  [name] [name]. [facility] [phone]

## 2023-01-14 NOTE — Patient Instructions (Addendum)
Order blood pressure cuff To check blood pressure 1 hour AFTER you have had your medication Make sure you have been sitting at least 5 mins  Record and let us know.  Goal <140/90  RESTART ELIQUIS 5 mg twice daily RESTART metoprolol 25 mg daily ( can take at night) RESTART entresto twice daily   In 1 week restart Lipitor- take 1/2 tablet for 1 week then increase to whole tablet.- take at night.

## 2023-01-29 IMAGING — CT CT ANGIO CHEST
2 of 7 series · 17 of 46 positions shown · IV contrast (APPLIED)
Comparison: None.

CLINICAL DATA: Chest pain, shortness of breath, PE suspected

EXAM:
CT ANGIOGRAPHY CHEST WITH CONTRAST
TECHNIQUE: Multidetector CT imaging of the chest was performed using the
standard protocol during bolus administration of intravenous
contrast. Multiplanar CT image reconstructions and MIPs were
obtained to evaluate the vascular anatomy.

[Series 8: thins · axial · 0.61mm/px · z∈[-412,-160]mm · 15 of 290 slices shown]
[im 19/290  lung]
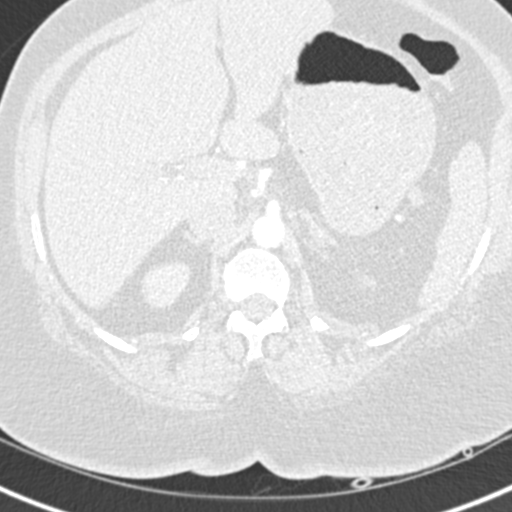
[im 37/290  soft-tissue]
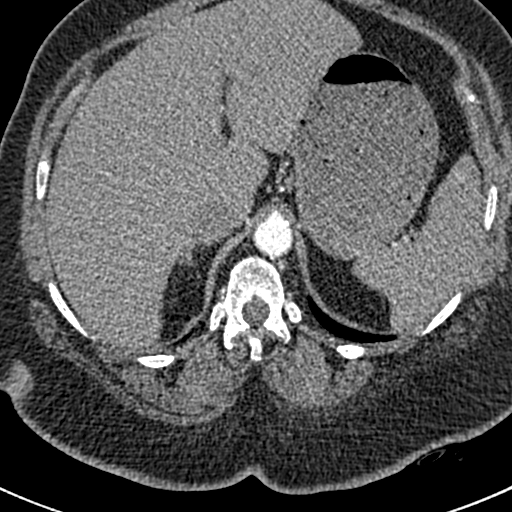
[im 55/290  lung]
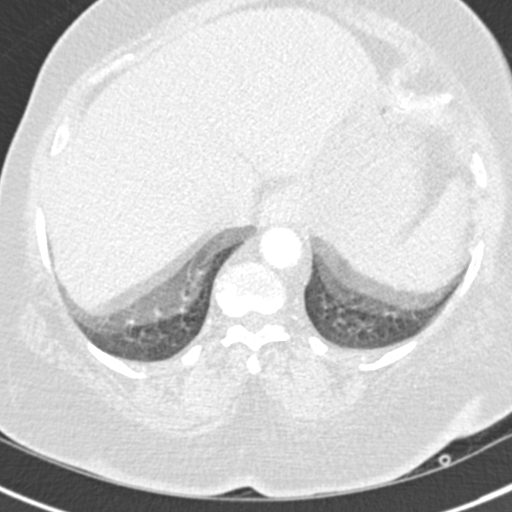
[im 73/290  soft-tissue]
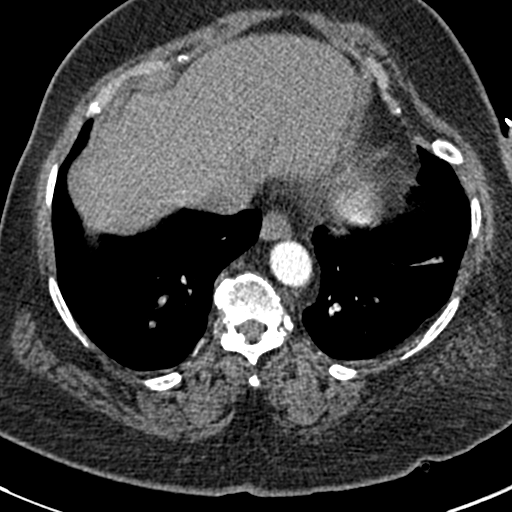
[im 91/290  lung]
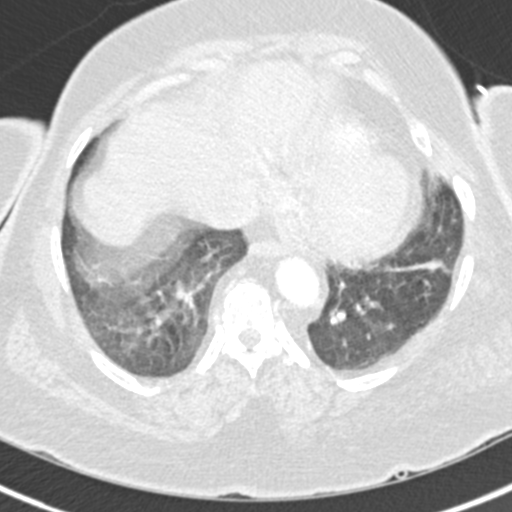
[im 109/290  soft-tissue]
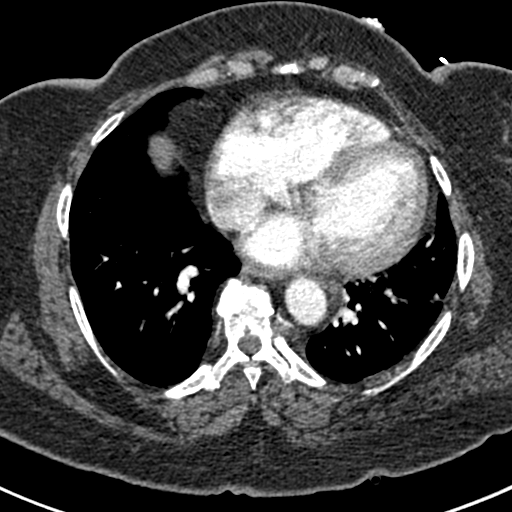
[im 127/290  lung]
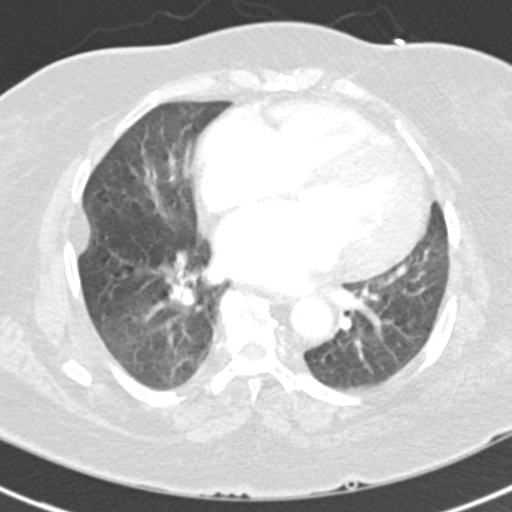
[im 145/290  soft-tissue]
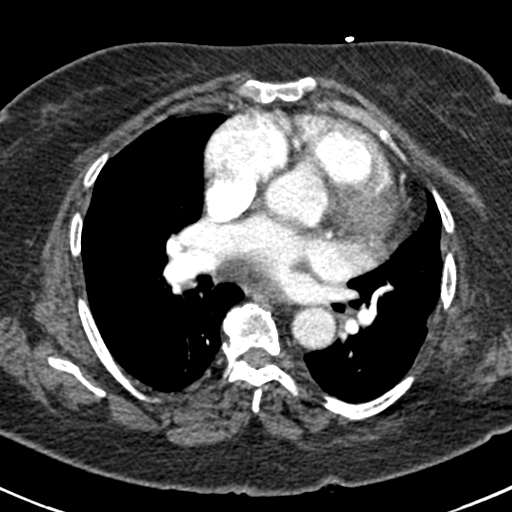
[im 163/290  lung]
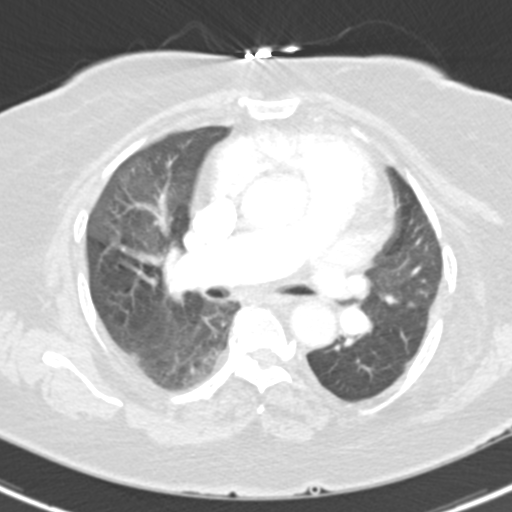
[im 181/290  soft-tissue]
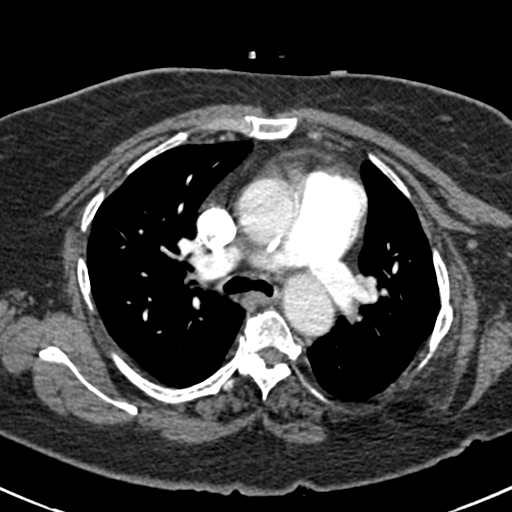
[im 199/290  lung]
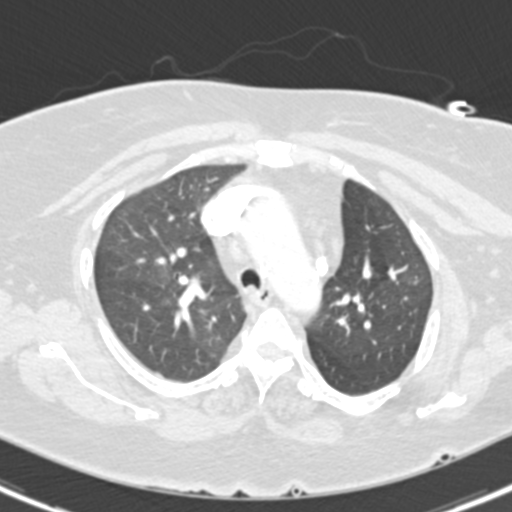
[im 217/290  soft-tissue]
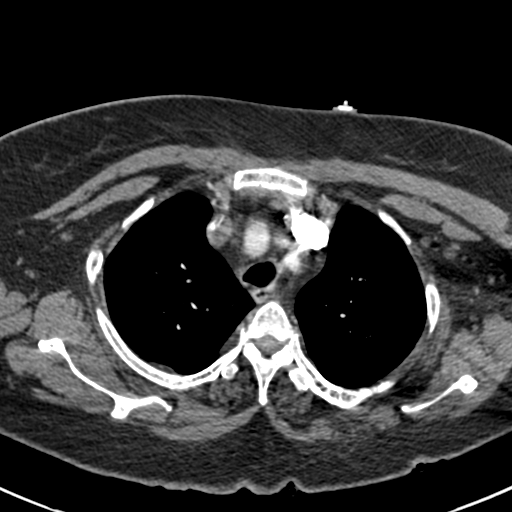
[im 235/290  lung]
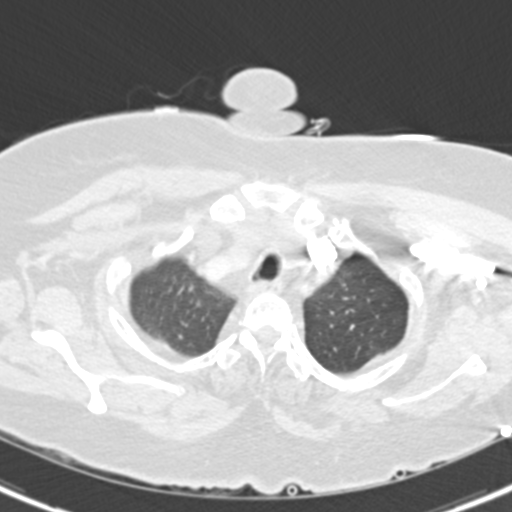
[im 253/290  soft-tissue]
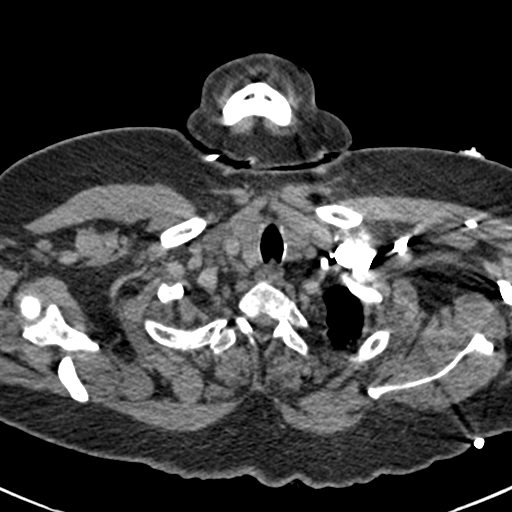
[im 271/290  lung]
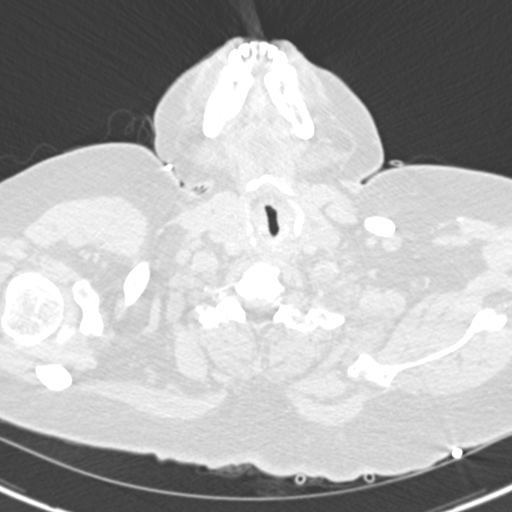

[Series 10: coronal mpr · coronal · 0.57mm/px · 2 of 87 slices shown]
[im 29/87  soft-tissue]
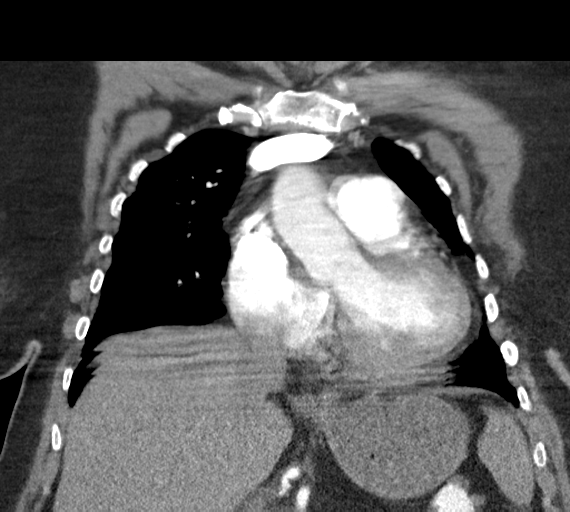
[im 58/87  soft-tissue]
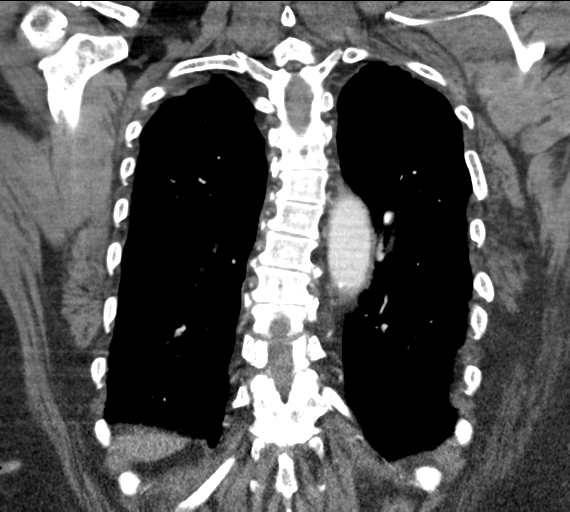

[17 of 46 positions shown; findings below may reference images not displayed]

RADIATION DOSE REDUCTION: This exam was performed according to the
departmental dose-optimization program which includes automated
exposure control, adjustment of the mA and/or kV according to
patient size and/or use of iterative reconstruction technique.

CONTRAST:  80mL OMNIPAQUE IOHEXOL 350 MG/ML SOLN
FINDINGS: Cardiovascular: Examination for pulmonary embolism is somewhat
limited by breath motion artifact, particularly in the lung bases.
Within this limitation, no evidence of pulmonary embolism through
the segmental pulmonary arterial level. Cardiomegaly. No pericardial
effusion. Enlargement of the main pulmonary artery, measuring up to
3.6 cm in caliber. Aortic atherosclerosis.

Mediastinum/Nodes: No enlarged mediastinal, hilar, or axillary lymph
nodes. Benign, coarsely calcified AP window nodes (series 8, image
97). Thyroid gland, trachea, and esophagus demonstrate no
significant findings.

Lungs/Pleura: Lungs are clear. No pleural effusion or pneumothorax.
Incidental small pleural lipoma about the peripheral right upper
lobe (series 8, image 161).

Upper Abdomen: No acute abnormality. There is a sizable, partially
imaged aneurysm in the left upper quadrant, most likely arising from
the distal splenic artery, measuring at least 2.2 cm in caliber
(series 8, image 290, series 10, image 30).

Musculoskeletal: No chest wall abnormality. No acute osseous
findings.

Review of the MIP images confirms the above findings.
IMPRESSION: 1. Examination for pulmonary embolism is somewhat limited by breath
motion artifact, particularly in the lung bases. Within this
limitation, no evidence of pulmonary embolism through the segmental
pulmonary arterial level.
2. Cardiomegaly.
3. Enlargement of the main pulmonary artery, as can be seen in
pulmonary hypertension.
4. Incidental note of a sizable, partially imaged aneurysm in the
left upper quadrant, most likely arising from the distal splenic
artery, measuring at least 2.2 cm in caliber. Recommend dedicated CT
angiogram of the abdomen and pelvis to further evaluate on a
nonemergent basis, when clinically appropriate.

Aortic Atherosclerosis ([id number]-[id number].W).

## 2023-02-06 ENCOUNTER — Other Ambulatory Visit: Payer: Self-pay | Admitting: Nurse Practitioner

## 2023-02-06 DIAGNOSIS — I480 Paroxysmal atrial fibrillation: Secondary | ICD-10-CM

## 2023-02-09 ENCOUNTER — Encounter: Payer: Self-pay | Admitting: *Deleted

## 2023-02-09 NOTE — Progress Notes (Signed)
Pt attended 01/10/23 screening event where her b/p was 150/84. At the event the pt identified no SDOH insecurities and noted she had been seeing [facility] as her PCP. However, chart review indicates pt changed her PCP to Abbey Chatters, NP at [facility] effective 01/14/23 when she had her first appt to get established. Per chart review, pt had not been taking any of her meds and her PCP restarted her b/p meds and gave pt a future appt on 02/16/2023 to follow up. Pt also has ongoing cardiology support, with a future appt with Dr. Odis Hollingshead at [facility] on 02/20/23. No additional health equity team support indicated at this time.

## 2023-02-16 ENCOUNTER — Ambulatory Visit (INDEPENDENT_AMBULATORY_CARE_PROVIDER_SITE_OTHER): Payer: 59 | Admitting: Nurse Practitioner

## 2023-02-16 ENCOUNTER — Encounter: Payer: Self-pay | Admitting: Nurse Practitioner

## 2023-02-16 VITALS — BP 134/82 | HR 63 | Temp 97.8°F | Ht 63.0 in | Wt 196.0 lb

## 2023-02-16 DIAGNOSIS — F330 Major depressive disorder, recurrent, mild: Secondary | ICD-10-CM | POA: Diagnosis not present

## 2023-02-16 DIAGNOSIS — M159 Polyosteoarthritis, unspecified: Secondary | ICD-10-CM

## 2023-02-16 DIAGNOSIS — I480 Paroxysmal atrial fibrillation: Secondary | ICD-10-CM

## 2023-02-16 DIAGNOSIS — J449 Chronic obstructive pulmonary disease, unspecified: Secondary | ICD-10-CM | POA: Diagnosis not present

## 2023-02-16 DIAGNOSIS — G4733 Obstructive sleep apnea (adult) (pediatric): Secondary | ICD-10-CM

## 2023-02-16 DIAGNOSIS — I5032 Chronic diastolic (congestive) heart failure: Secondary | ICD-10-CM | POA: Diagnosis not present

## 2023-02-16 DIAGNOSIS — I10 Essential (primary) hypertension: Secondary | ICD-10-CM

## 2023-02-16 DIAGNOSIS — E782 Mixed hyperlipidemia: Secondary | ICD-10-CM

## 2023-02-16 DIAGNOSIS — U071 COVID-19: Secondary | ICD-10-CM

## 2023-02-16 LAB — CBC WITH DIFFERENTIAL/PLATELET
Absolute Monocytes: 400 cells/uL (ref 200–950)
Basophils Absolute: 20 cells/uL (ref 0–200)
Basophils Relative: 0.4 %
Eosinophils Absolute: 40 cells/uL (ref 15–500)
Eosinophils Relative: 0.8 %
HCT: 35.6 % (ref 35.0–45.0)
Hemoglobin: 11.5 g/dL — ABNORMAL LOW (ref 11.7–15.5)
Lymphs Abs: 2060 cells/uL (ref 850–3900)
MCH: 28.6 pg (ref 27.0–33.0)
MCHC: 32.3 g/dL (ref 32.0–36.0)
MCV: 88.6 fL (ref 80.0–100.0)
MPV: 11.3 fL (ref 7.5–12.5)
Monocytes Relative: 8 %
Neutro Abs: 2480 cells/uL (ref 1500–7800)
Neutrophils Relative %: 49.6 %
Platelets: 203 10*3/uL (ref 140–400)
RBC: 4.02 10*6/uL (ref 3.80–5.10)
RDW: 12.4 % (ref 11.0–15.0)
Total Lymphocyte: 41.2 %
WBC: 5 10*3/uL (ref 3.8–10.8)

## 2023-02-16 MED ORDER — ALBUTEROL SULFATE HFA 108 (90 BASE) MCG/ACT IN AERS
2.0000 | INHALATION_SPRAY | Freq: Four times a day (QID) | RESPIRATORY_TRACT | 11 refills | Status: DC | PRN
Start: 2023-02-16 — End: 2023-07-17

## 2023-02-16 NOTE — Progress Notes (Unsigned)
Careteam: Patient Care Team: [name], NP as PCP - General (Geriatric Medicine)  PLACE OF SERVICE:  [facility]  Advanced Directive information Does Patient Have a Medical Advance Directive?: Yes, Does patient want to make changes to medical advance directive?: No - Patient declined  Allergies  Allergen Reactions   Sodium Ferric Gluconate [Ferrous Gluconate] Other (See Comments)    Patient had near syncope with abdominal pain and diaphoresis about an hour after IV ferric gluconate although it was while she was on commode having bowel movements   Imdur [Isosorbide Nitrate]     headache   Tetracyclines & Related Itching    Chief Complaint  Patient presents with   Medical Management of Chronic Issues    1 month follow-up and surgical clearance for right total hip arthroplasty. Was on Lagevrio for covid treatment, FYI (completed course). Discuss medications marked as not taking. Patient had pill bottles at appointment. Discuss MOST form, patient would like it removed from her chart.      HPI: Patient is a 62 y.o. female for routine follow up.   Her blood pressure cuff is not working, needs to get new.  She is taking the metoprolol 25 mg daily along with entresto twice daily  She continues to feel good on these medication.  She was previously on farxiga but stopped because she was having headaches.   A fib- she restarted eliquis 5 mg by mouth twice daily   Hyperlipidemia- continues on lipitor.   Since she was here last her father died. She got COVID after her father died.  Continues to have some congestion and cough but overall doing much better. Continues to use mucinex   She is on trazodone for sleep which is helping her.   She has a lot of pain in right hip and plan to have total hip by Dr Linna Caprice.  She had knee injections to both knees and pain is doing good at this time.   COPD- recently had covid so she had to use albuterol but otherwise uses once every 2  weeks. Otherwise controlled breo   Review of Systems:  Review of Systems  Constitutional:  Negative for chills, fever and weight loss.  HENT:  Negative for tinnitus.   Respiratory:  Positive for cough. Negative for sputum production and shortness of breath.   Cardiovascular:  Negative for chest pain, palpitations and leg swelling.  Gastrointestinal:  Negative for abdominal pain, constipation, diarrhea and heartburn.  Genitourinary:  Negative for dysuria, frequency and urgency.  Musculoskeletal:  Negative for back pain, falls, joint pain and myalgias.  Skin: Negative.   Neurological:  Negative for dizziness and headaches.  Psychiatric/Behavioral:  Negative for depression and memory loss. The patient does not have insomnia.     Past Medical History:  Diagnosis Date   Asthma    Bloody stool    CHF (congestive heart failure) (HCC)    Chronic diastolic heart failure (HCC)    Dental decay    Gastritis, Helicobacter pylori 06/22/2015   Hypertension    Paroxysmal atrial fibrillation (HCC)    Pneumonia 06/21/2015   Prepyloric ulcer 06/22/2015   Seasonal allergies    Past Surgical History:  Procedure Laterality Date   BARTHOLIN CYST MARSUPIALIZATION Left 1997   BIOPSY  11/04/2021   Procedure: BIOPSY;  Surgeon: Kerin Salen, MD;  Location: [facility];  Service: Gastroenterology;;   BUBBLE STUDY  10/31/2021   Procedure: BUBBLE STUDY;  Surgeon: Tessa Lerner, DO;  Location: [facility];  Service: Cardiovascular;;   CARDIAC SURGERY N/A 1963   Repair of PFO   CARDIOVERSION N/A 10/31/2021   Procedure: CARDIOVERSION;  Surgeon: Tessa Lerner, DO;  Location: [facility];  Service: Cardiovascular;  Laterality: N/A;   COLONOSCOPY N/A 2002   Performed secondary to mother's diagnosis of colon CA at age 45   COLONOSCOPY WITH PROPOFOL N/A 11/04/2021   Procedure: COLONOSCOPY WITH PROPOFOL;  Surgeon: Kerin Salen, MD;  Location: [facility];  Service: Gastroenterology;  Laterality: N/A;    ESOPHAGOGASTRODUODENOSCOPY (EGD) WITH PROPOFOL N/A 06/22/2015   Procedure: ESOPHAGOGASTRODUODENOSCOPY (EGD) WITH PROPOFOL;  Surgeon: Willis Modena, MD;  Location: [facility];  Service: Endoscopy;  Laterality: N/A;   ESOPHAGOGASTRODUODENOSCOPY (EGD) WITH PROPOFOL N/A 11/04/2021   Procedure: ESOPHAGOGASTRODUODENOSCOPY (EGD) WITH PROPOFOL;  Surgeon: Kerin Salen, MD;  Location: [facility];  Service: Gastroenterology;  Laterality: N/A;   FRACTURE SURGERY     NASAL SINUS SURGERY Bilateral 1984   OVARIAN CYST REMOVAL Left 1990   POLYPECTOMY  11/04/2021   Procedure: POLYPECTOMY;  Surgeon: Kerin Salen, MD;  Location: [facility];  Service: Gastroenterology;;   TEE WITHOUT CARDIOVERSION N/A 10/31/2021   Procedure: TRANSESOPHAGEAL ECHOCARDIOGRAM (TEE);  Surgeon: Tessa Lerner, DO;  Location: [facility];  Service: Cardiovascular;  Laterality: N/A;   TUBAL LIGATION     UMBILICAL HERNIA REPAIR N/A 2005   Social History:   reports that she has never smoked. She has never used smokeless tobacco. She reports that she does not currently use alcohol. She reports that she does not use drugs.  Family History  Problem Relation Age of Onset   Colon cancer Mother 30   Hypertension Mother    Cancer Father 26       liver cancer   Hypertension Father    Cerebral aneurysm Sister    Goiter Sister    Breast cancer Neg Hx     Medications: Patient's Medications  New Prescriptions   No medications on file  Previous Medications   ACETAMINOPHEN (TYLENOL) 325 MG TABLET    Take 2 tablets (650 mg total) by mouth every 6 (six) hours as needed.   ALBUTEROL (VENTOLIN HFA) 108 (90 BASE) MCG/ACT INHALER    INHALE 2 PUFFS INTO THE LUNGS EVERY 6 (SIX) HOURS AS NEEDED FOR WHEEZING OR SHORTNESS OF BREATH.   APIXABAN (ELIQUIS) 5 MG TABS TABLET    Take 1 tablet (5 mg total) by mouth 2 (two) times daily.   ATORVASTATIN (LIPITOR) 20 MG TABLET    TAKE 1 TABLET (20 MG TOTAL) BY MOUTH AT BEDTIME FOR CHOLESTEROL   BREO ELLIPTA  200-25 MCG/ACT AEPB    Inhale 1 puff into the lungs daily.   CHOLECALCIFEROL (VITAMIN D3) 25 MCG (1000 UNIT) TABLET    Take 1,000 Units by mouth daily.   FARXIGA 10 MG TABS TABLET    TAKE 1 TABLET (10 MG TOTAL) BY MOUTH DAILY.   METOPROLOL SUCCINATE (TOPROL-XL) 25 MG 24 HR TABLET    TAKE 1 TABLET BY MOUTH DAILY. TAKE WITH OR IMMEDIATELY FOLLOWING A MEAL.   OXYGEN    Inhale 2 L/min into the lungs continuous.   SACUBITRIL-VALSARTAN (ENTRESTO) 24-26 MG    Take 1 tablet by mouth 2 (two) times daily.   TRAZODONE (DESYREL) 50 MG TABLET    Take 1 tablet (50 mg total) by mouth at bedtime.  Modified Medications   No medications on file  Discontinued Medications   No medications on file    Physical Exam:  Vitals:   02/16/23 1347  BP: 134/82  Pulse: 63  Temp: 97.8 F (36.6 C)  TempSrc: Temporal  SpO2: 99%  Weight: 196 lb (88.9 kg)  Height: 5\' 3"  (1.6 m)   Body mass index is 34.72 kg/m. Wt Readings from Last 3 Encounters:  02/16/23 196 lb (88.9 kg)  01/14/23 193 lb (87.5 kg)  12/18/22 200 lb (90.7 kg)    Physical Exam Constitutional:      General: She is not in acute distress.    Appearance: She is well-developed. She is not diaphoretic.  HENT:     Head: Normocephalic and atraumatic.     Mouth/Throat:     Pharynx: No oropharyngeal exudate.  Eyes:     Conjunctiva/sclera: Conjunctivae normal.     Pupils: Pupils are equal, round, and reactive to light.  Cardiovascular:     Rate and Rhythm: Normal rate and regular rhythm.     Heart sounds: Normal heart sounds.  Pulmonary:     Effort: Pulmonary effort is normal.     Breath sounds: Normal breath sounds.  Abdominal:     General: Bowel sounds are normal.     Palpations: Abdomen is soft.  Musculoskeletal:     Cervical back: Normal range of motion and neck supple.     Right hip: Decreased range of motion.     Right lower leg: No edema.     Left lower leg: No edema.  Skin:    General: Skin is warm and dry.  Neurological:      Mental Status: She is alert and oriented to person, place, and time.  Psychiatric:        Mood and Affect: Mood normal.     Labs reviewed: Basic Metabolic Panel: Recent Labs    03/17/22 0000 03/31/22 1239 04/03/22 0413 08/20/22 0000 10/04/22 1355 10/05/22 1937 12/08/22 0000 12/18/22 2102  NA 140   < > 140 140 137 137 142 137  K 3.2*   < > 3.2* 3.5 3.2* 3.2* 3.9 3.0*  CL 92*   < > 98 104 103 99 103 102  CO2 34*   < > 35* 28* 26 27 26* 23  GLUCOSE  --    < > 126*  --  93 112*  --  97  BUN 9   < > 20 7 6* 9 11 11   CREATININE 0.9   < > 0.74 0.6 0.57 0.75 0.7 0.79  CALCIUM 9.0   < > 8.9 9.2 8.9 9.3 9.8 9.2  MG  --   --  1.8  --   --   --   --   --   TSH 1.60  --   --  0.47  --   --   --   --    < > = values in this interval not displayed.   Liver Function Tests: Recent Labs    03/31/22 1239 08/20/22 0000 10/04/22 1355 10/05/22 1937 12/08/22 0000  AST 24 12* 17 17  --   ALT 24 15 13 12   --   ALKPHOS 98 57 69 63  --   BILITOT 1.1  --  1.3* 1.3*  --   PROT 7.0  --  7.4 7.3  --   ALBUMIN 3.3* 4.0 4.0 3.9 4.5   Recent Labs    10/04/22 1355 10/05/22 1937  LIPASE 23 22   No results for input(s): "AMMONIA" in the last 8760 hours. CBC: Recent Labs    08/20/22 0000 10/04/22 1355 10/05/22 1937 12/18/22 2102  WBC 10.2 11.0* 9.9  6.4  NEUTROABS 6,814.00 8.2* 7.1  --   HGB 12.7 13.2 12.6 11.5*  HCT 38 40.9 38.5 34.7*  MCV  --  88.7 88.1 85.7  PLT 257 213 204 227   Lipid Panel: Recent Labs    03/17/22 0000 08/20/22 0000  CHOL 213* 136  HDL 67 54  LDLCALC 120 69  TRIG 138 54   TSH: Recent Labs    03/17/22 0000 08/20/22 0000  TSH 1.60 0.47   A1C: Lab Results  Component Value Date   HGBA1C 5.6 10/30/2021     Assessment/Plan 1. Chronic obstructive pulmonary disease, unspecified COPD type (HCC) Stable, recently with covid requiring her inhaler but otherwise well controlled on breo. No worsening shortness of breath, cough or congestion. Followed by  pulmonary  - albuterol (VENTOLIN HFA) 108 (90 Base) MCG/ACT inhaler; Inhale 2 puffs into the lungs every 6 (six) hours as needed for wheezing or shortness of breath.  Dispense: 8.5 g; Refill: 11  2. Chronic diastolic CHF (congestive heart failure) (HCC) Stable, not taking farxiga, plans to discuss this with cardiologist, did resume entresto and metoprolol  - COMPLETE METABOLIC PANEL WITH GFR - CBC with Differential/Platelet  3. Primary osteoarthritis involving multiple joints -worse in right hip, plan for total hip replacement. Medically she is stable for surgery without acute issues at this time. She plans to get cardiology sign off later this week.   4. Mild episode of recurrent major depressive disorder (HCC) Continues to be controlled on lifestyle modifications  5. Paroxysmal atrial fibrillation (HCC) Rate controlled on metoprolol, continues on eliquis for anticoagulation which she has resumed.   6. OSA (obstructive sleep apnea) Unable to tolerate cpap  7. Primary hypertension -improved controlled now that she is taking medication. Continue current regimen   8. Mixed hyperlipidemia -continues on lipitor  - Lipid panel  9. COVID -recently with COVID now improved. Continue supportive care.     Return in about 4 months (around 06/19/2023) for routine follow up.:  Breven Guidroz K. Biagio Borg [facility] [phone]

## 2023-02-19 ENCOUNTER — Ambulatory Visit (INDEPENDENT_AMBULATORY_CARE_PROVIDER_SITE_OTHER): Payer: 59 | Admitting: Nurse Practitioner

## 2023-02-19 ENCOUNTER — Telehealth: Payer: Self-pay | Admitting: *Deleted

## 2023-02-19 ENCOUNTER — Encounter: Payer: Self-pay | Admitting: Nurse Practitioner

## 2023-02-19 DIAGNOSIS — Z0000 Encounter for general adult medical examination without abnormal findings: Secondary | ICD-10-CM | POA: Diagnosis not present

## 2023-02-19 MED ORDER — BD ASSURE BPM/AUTO ARM CUFF MISC: 0 refills | Status: DC

## 2023-02-19 NOTE — Telephone Encounter (Signed)
[name], [name] [name] are scheduled for a virtual visit with your provider today.    Just as we do with appointments in the office, we must obtain your consent to participate.  Your consent will be active for this visit and any virtual visit you [name] [name] have with one of our providers in the next 365 days.    If you have a MyChart account, I can also send a copy of this consent to you electronically.  All virtual visits are billed to your insurance company just like a traditional visit in the office.  As this is a virtual visit, video technology does not allow for your provider to perform a traditional examination.  This [name] [name] limit your provider's ability to fully assess your condition.  If your provider identifies any concerns that need to be evaluated in person or the need to arrange testing such as labs, EKG, etc, we will make arrangements to do so.    Although advances in technology are sophisticated, we cannot ensure that it will always work on either your end or our end.  If the connection with a video visit is poor, we [name] [name] have to switch to a telephone visit.  With either a video or telephone visit, we are not always able to ensure that we have a secure connection.   I need to obtain your verbal consent now.   Are you willing to proceed with your visit today?   [name] [name] has provided verbal consent on 02/19/2023 for a virtual visit (video or telephone).   [name] [name], [place] 02/19/2023  9:46 AM

## 2023-02-19 NOTE — Addendum Note (Signed)
Addended by: Nelda Severe A on: 02/19/2023 10:16 AM   Modules accepted: Orders

## 2023-02-19 NOTE — Patient Instructions (Signed)
  [name] [name] , Thank you for taking time to come for your Medicare Wellness Visit. I appreciate your ongoing commitment to your health goals. Please review the following plan we discussed and let me know if I can assist you in the future.   These are the goals we discussed:  Goals   None     This is a list of the screening recommended for you and due dates:  Health Maintenance  Topic Date Due   Pap Smear  Never done   Zoster (Shingles) Vaccine (1 of 2) Never done   Flu Shot  10/19/2023*   Medicare Annual Wellness Visit  02/19/2024   Mammogram  07/31/2024   DTaP/Tdap/Td vaccine (2 - Td or Tdap) 03/26/2025   Colon Cancer Screening  11/05/2031   Hepatitis C Screening  Completed   HIV Screening  Completed   HPV Vaccine  Aged Out   COVID-19 Vaccine  Discontinued  *Topic was postponed. The date shown is not the original due date.

## 2023-02-19 NOTE — Progress Notes (Signed)
Subjective:   [name] [name] is a 62 y.o. female who presents for Medicare Annual (Subsequent) preventive examination.  Visit Complete: Virtual  I connected with  Vernie Murders on 02/19/23 by a video and audio enabled telemedicine application and verified that I am speaking with the correct person using two identifiers.  Patient Location: Home  Provider Location: Office/Clinic  I discussed the limitations of evaluation and management by telemedicine. The patient expressed understanding and agreed to proceed.  Patient Medicare AWV questionnaire was completed by the patient on 8/1; I have confirmed that all information answered by patient is correct and no changes since this date.  Review of Systems     Cardiac Risk Factors include: advanced age (>44men, >54 women);hypertension;dyslipidemia     Objective:    Today's Vitals   02/19/23 1006  PainSc: 3    There is no height or weight on file to calculate BMI.     02/19/2023    9:38 AM 02/16/2023    1:15 PM 01/13/2023    4:31 PM 12/18/2022    8:44 PM 10/05/2022    6:59 PM 03/31/2022    2:34 PM 03/31/2022   11:49 AM  Advanced Directives  Does Patient Have a Medical Advance Directive? Yes No Yes No No No No  Type of Advance Directive Out of facility DNR (pink MOST or yellow form)  Living will      Does patient want to make changes to medical advance directive? No - Patient declined No - Patient declined No - Patient declined      Would patient like information on creating a medical advance directive?    No - Patient declined No - Patient declined No - Patient declined No - Patient declined    Current Medications (verified) Outpatient Encounter Medications as of 02/19/2023  Medication Sig   acetaminophen (TYLENOL) 325 MG tablet Take 2 tablets (650 mg total) by mouth every 6 (six) hours as needed.   albuterol (VENTOLIN HFA) 108 (90 Base) MCG/ACT inhaler Inhale 2 puffs into the lungs every 6 (six) hours as needed for wheezing or  shortness of breath.   apixaban (ELIQUIS) 5 MG TABS tablet Take 1 tablet (5 mg total) by mouth 2 (two) times daily.   atorvastatin (LIPITOR) 20 MG tablet TAKE 1 TABLET (20 MG TOTAL) BY MOUTH AT BEDTIME FOR CHOLESTEROL   BREO ELLIPTA 200-25 MCG/ACT AEPB Inhale 1 puff into the lungs daily.   cholecalciferol (VITAMIN D3) 25 MCG (1000 UNIT) tablet Take 1,000 Units by mouth daily.   metoprolol succinate (TOPROL-XL) 25 MG 24 hr tablet TAKE 1 TABLET BY MOUTH DAILY. TAKE WITH OR IMMEDIATELY FOLLOWING A MEAL.   OXYGEN Inhale 2 L/min into the lungs continuous.   sacubitril-valsartan (ENTRESTO) 24-26 MG Take 1 tablet by mouth 2 (two) times daily.   traZODone (DESYREL) 50 MG tablet Take 1 tablet (50 mg total) by mouth at bedtime.   No facility-administered encounter medications on file as of 02/19/2023.    Allergies (verified) Sodium ferric gluconate [ferrous gluconate], Imdur [isosorbide nitrate], and Tetracyclines & related   History: Past Medical History:  Diagnosis Date   Asthma    Bloody stool    CHF (congestive heart failure) (HCC)    Chronic diastolic heart failure (HCC)    Dental decay    Gastritis, Helicobacter pylori 06/22/2015   Hypertension    Paroxysmal atrial fibrillation (HCC)    Pneumonia 06/21/2015   Prepyloric ulcer 06/22/2015   Seasonal allergies    Past  Surgical History:  Procedure Laterality Date   BARTHOLIN CYST MARSUPIALIZATION Left 1997   BIOPSY  11/04/2021   Procedure: BIOPSY;  Surgeon: Kerin Salen, MD;  Location: [facility];  Service: Gastroenterology;;   BUBBLE STUDY  10/31/2021   Procedure: BUBBLE STUDY;  Surgeon: Tessa Lerner, DO;  Location: [facility];  Service: Cardiovascular;;   CARDIAC SURGERY N/A 1963   Repair of PFO   CARDIOVERSION N/A 10/31/2021   Procedure: CARDIOVERSION;  Surgeon: Tessa Lerner, DO;  Location: [facility];  Service: Cardiovascular;  Laterality: N/A;   COLONOSCOPY N/A 2002   Performed secondary to mother's diagnosis of colon CA at  age 21   COLONOSCOPY WITH PROPOFOL N/A 11/04/2021   Procedure: COLONOSCOPY WITH PROPOFOL;  Surgeon: Kerin Salen, MD;  Location: [facility];  Service: Gastroenterology;  Laterality: N/A;   ESOPHAGOGASTRODUODENOSCOPY (EGD) WITH PROPOFOL N/A 06/22/2015   Procedure: ESOPHAGOGASTRODUODENOSCOPY (EGD) WITH PROPOFOL;  Surgeon: Willis Modena, MD;  Location: [facility] END[facility]COPY;  Service: Endoscopy;  Laterality: N/A;   ESOPHAGOGASTRODUODENOSCOPY (EGD) WITH PROPOFOL N/A 11/04/2021   Procedure: ESOPHAGOGASTRODUODENOSCOPY (EGD) WITH PROPOFOL;  Surgeon: Kerin Salen, MD;  Location: [facility];  Service: Gastroenterology;  Laterality: N/A;   FRACTURE SURGERY     NASAL SINUS SURGERY Bilateral 1984   OVARIAN CYST REMOVAL Left 1990   POLYPECTOMY  11/04/2021   Procedure: POLYPECTOMY;  Surgeon: Kerin Salen, MD;  Location: [facility];  Service: Gastroenterology;;   TEE WITHOUT CARDIOVERSION N/A 10/31/2021   Procedure: TRANSESOPHAGEAL ECHOCARDIOGRAM (TEE);  Surgeon: Tessa Lerner, DO;  Location: [facility];  Service: Cardiovascular;  Laterality: N/A;   TUBAL LIGATION     UMBILICAL HERNIA REPAIR N/A 2005   Family History  Problem Relation Age of Onset   Colon cancer Mother 48   Hypertension Mother    Cancer Father 83       liver cancer   Hypertension Father    Cerebral aneurysm Sister    Goiter Sister    Breast cancer Neg Hx    Social History   Socioeconomic History   Marital status: Divorced    Spouse name: Not on file   Number of children: 4   Years of education: Not on file   Highest education level: Not on file  Occupational History   Occupation: Kitchen/nutrition at [employer]    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  Tobacco Use   Smoking status: Never   Smokeless tobacco: Never  Vaping Use   Vaping status: Never Used  Substance and Sexual Activity   Alcohol use: Not Currently    Comment: previously 3-4 beers per day   Drug use: No   Sexual activity: Yes  Other  Topics Concern   Not on file  Social History Narrative   Born in [place], but grew up in [place]   Was homeless Sept. 2016, when initially established   Now has own apartment, lives alone   Brother and 2 sons live in town, improving relationships.   States ended up homeless due to difficulty with living situation when children moved to [place] to live with her--she ended up leaving to get away from them and did not have a job.     Not ready to discuss details   Social Determinants of Health   Financial Resource Strain: Not on file  Food Insecurity: No Food Insecurity (01/10/2023)   Hunger Vital Sign    Worried About Running Out of Food in the Last Year: Never true    Ran Out of Food in the Last  Year: Never true  Transportation Needs: No Transportation Needs (01/10/2023)   PRAPARE - Administrator, Civil Service (Medical): No    Lack of Transportation (Non-Medical): No  Physical Activity: Not on file  Stress: Not on file  Social Connections: Unknown (12/03/2021)   Received from [facility]   Social Network    Social Network: Not on file    Tobacco Counseling Counseling given: Not Answered   Clinical Intake:  Pre-visit preparation completed: Yes  Pain : 0-10 Pain Score: 3  Pain Type: Chronic pain Pain Location: Hip Pain Orientation: Right Pain Onset: More than a month ago Pain Frequency: Constant     BMI - recorded: 34 Nutritional Risks: None Diabetes: No  How often do you need to have someone help you when you read instructions, pamphlets, or other written materials from your doctor or pharmacy?: 1 - Never         Activities of Daily Living    02/19/2023   10:02 AM 03/31/2022    2:37 PM  In your present state of health, do you have any difficulty performing the following activities:  Hearing? 1   Vision? 1   Difficulty concentrating or making decisions? 0   Walking or climbing stairs? 1   Comment due to hip pain.   Dressing or  bathing? 0   Doing errands, shopping? 0 1  Preparing Food and eating ? N   Using the Toilet? N   In the past six months, have you accidently leaked urine? Y   Do you have problems with loss of bowel control? Y     Patient Care Team: [name], NP as PCP - General (Geriatric Medicine)  Indicate any recent Medical Services you may have received from other than [facility] providers in the past year (date may be approximate).     Assessment:   This is a routine wellness examination for Aliha.  Hearing/Vision screen No results found.  Dietary issues and exercise activities discussed:     Goals Addressed   None    Depression Screen    02/19/2023    9:39 AM 02/16/2023    1:44 PM 12/20/2021    9:30 AM 08/22/2021   11:21 AM 03/11/2021   11:12 AM 09/05/2020    8:32 AM 05/15/2020   10:18 AM  PHQ 2/9 Scores  PHQ - 2 Score 0  0 1 1 0 0  PHQ- 9 Score     2    Exception Documentation  Other- indicate reason in comment box       Not completed  Seeing a therapist         Fall Risk    02/19/2023    9:38 AM 02/16/2023    1:44 PM 01/13/2023    4:33 PM 12/20/2021    9:30 AM 08/22/2021   11:21 AM  Fall Risk   Falls in the past year? 0 0 0 0 0  Number falls in past yr: 0 0 0    Injury with Fall? 0 0 0    Risk for fall due to :  No Fall Risks No Fall Risks    Follow up  Falls evaluation completed Falls evaluation completed      MEDICARE RISK AT HOME:   TIMED UP AND GO:  Was the test performed?  No    Cognitive Function:        02/19/2023    9:40 AM  6CIT Screen  What Year? 0 points  What month? 0 points  What time? 0 points  Count back from 20 0 points  Months in reverse 0 points  Repeat phrase 2 points  Total Score 2 points    Immunizations Immunization History  Administered Date(s) Administered   PPD Test 03/12/2015   Tdap 03/27/2015    TDAP status: Up to date  Flu Vaccine status: Declined, Education has been provided regarding the importance of this vaccine but  patient still declined. Advised may receive this vaccine at local pharmacy or Health Dept. Aware to provide a copy of the vaccination record if obtained from local pharmacy or Health Dept. Verbalized acceptance and understanding.   Covid-19 vaccine status: Information provided on how to obtain vaccines.   Qualifies for Shingles Vaccine? Yes   Zostavax completed No   Shingrix Completed?: No.    Education has been provided regarding the importance of this vaccine. Patient has been advised to call insurance company to determine out of pocket expense if they have not yet received this vaccine. Advised may also receive vaccine at local pharmacy or Health Dept. Verbalized acceptance and understanding.  Screening Tests Health Maintenance  Topic Date Due   PAP SMEAR-Modifier  Never done   Zoster Vaccines- Shingrix (1 of 2) Never done   INFLUENZA VACCINE  10/19/2023 (Originally 02/19/2023)   Medicare Annual Wellness (AWV)  02/19/2024   MAMMOGRAM  07/31/2024   DTaP/Tdap/Td (2 - Td or Tdap) 03/26/2025   Colonoscopy  11/05/2031   Hepatitis C Screening  Completed   HIV Screening  Completed   HPV VACCINES  Aged Out   COVID-19 Vaccine  Discontinued    Health Maintenance  Health Maintenance Due  Topic Date Due   PAP SMEAR-Modifier  Never done   Zoster Vaccines- Shingrix (1 of 2) Never done    Colorectal cancer screening: Type of screening: Colonoscopy. Completed 2023. Repeat every 10 years  Mammogram status: Completed 07/2022. Repeat every year  Bone density due at 65.   Lung Cancer Screening: (Low Dose CT Chest recommended if Age 22-80 years, 20 pack-year currently smoking OR have quit w/in 15years.) does not qualify.   Lung Cancer Screening Referral: na  Additional Screening:  Hepatitis C Screening: does qualify; Completed   Vision Screening: Recommended annual ophthalmology exams for early detection of glaucoma and other disorders of the eye. Is the patient up to date with their  annual eye exam?  Yes  Who is the provider or what is the name of the office in which the patient attends annual eye exams? Unsure of name If pt is not established with a provider, would they like to be referred to a provider to establish care? No .   Dental Screening: Recommended annual dental exams for proper oral hygiene   Community Resource Referral / Chronic Care Management: CRR required this visit?  No   CCM required this visit?  No     Plan:     I have personally reviewed and noted the following in the patient's chart:   Medical and social history Use of alcohol, tobacco or illicit drugs  Current medications and supplements including opioid prescriptions. Patient is not currently taking opioid prescriptions. Functional ability and status Nutritional status Physical activity Advanced directives List of other physicians Hospitalizations, surgeries, and ER visits in previous 12 months Vitals Screenings to include cognitive, depression, and falls Referrals and appointments  In addition, I have reviewed and discussed with patient certain preventive protocols, quality metrics, and best practice recommendations. A written personalized care  plan for preventive services as well as general preventive health recommendations were provided to patient.     [name], NP   02/19/2023   After Visit Summary: (MyChart) Due to this being a telephonic visit, the after visit summary with patients personalized plan was offered to patient via MyChart

## 2023-02-19 NOTE — Progress Notes (Signed)
   This service is provided via telemedicine  No vital signs collected/recorded due to the encounter was a telemedicine visit.   Location of patient (ex: home, work):  Home  Patient consents to a telephone visit:  Yes  Location of the provider (ex: office, home):  Office [place].   Name of any referring provider:  na  Names of all persons participating in the telemedicine service and their role in the encounter:  Luiz Iron, Patient, Nelda Severe, CMA, Abbey Chatters, NP  Time spent on call:  7:10

## 2023-02-20 ENCOUNTER — Encounter: Payer: Self-pay | Admitting: Cardiology

## 2023-02-20 ENCOUNTER — Ambulatory Visit: Payer: 59 | Admitting: Cardiology

## 2023-02-20 VITALS — BP 145/72 | HR 57 | Resp 16 | Ht 63.0 in | Wt 198.0 lb

## 2023-02-20 DIAGNOSIS — I480 Paroxysmal atrial fibrillation: Secondary | ICD-10-CM

## 2023-02-20 DIAGNOSIS — E782 Mixed hyperlipidemia: Secondary | ICD-10-CM

## 2023-02-20 DIAGNOSIS — I5032 Chronic diastolic (congestive) heart failure: Secondary | ICD-10-CM

## 2023-02-20 DIAGNOSIS — Z7901 Long term (current) use of anticoagulants: Secondary | ICD-10-CM

## 2023-02-20 DIAGNOSIS — I5020 Unspecified systolic (congestive) heart failure: Secondary | ICD-10-CM

## 2023-02-20 MED ORDER — DAPAGLIFLOZIN PROPANEDIOL 10 MG PO TABS
10.0000 mg | ORAL_TABLET | Freq: Every day | ORAL | 0 refills | Status: DC
Start: 2023-02-20 — End: 2023-04-06

## 2023-02-20 NOTE — Progress Notes (Signed)
ID:  [name] [name], DOB Dec 02, 1960, MRN [id number]  PCP:  [name], NP  Cardiologist:  Tessa Lerner, DO, [facility] (established care October 28, 2021)  Date: 02/20/23 Last Office Visit: 11/21/2022  Chief Complaint  Patient presents with   heart failure management   Follow-up    HPI  [name] [name] is a 62 y.o. African-American female whose past medical history and cardiovascular risk factors include: Paroxysmal atrial fibrillation status post direct-current cardioversion, Hypertension, asthma, heart failure with improved EF / HFpEF,  sleep apnea not on CPAP, COPD, history of alcohol abuse, postmenopausal female.  Patient is being followed by the practice for paroxysmal atrial fibrillation and heart failure.  During her prior hospitalization for A-fib with RVR she underwent direct-current cardioversion which restored normal sinus rhythm.  Post cardioversion her LVEF improved from 25-30% to 60-65%with grade 2 diastolic dysfunction.   During her last hospitalization she used to have NSVT's while asleep.  She was recommended to have sleep study and has been diagnosed with sleep apnea but favors not to be using device therapy stating that "I don't have it."  In the past he is also illustrated noncompliance with outpatient follow-up and uptitration of medical therapy.  At last office visit she requested preoperative risk stratification for right hip replacement. Still pending.   Denies anginal chest pain or heart failure symptoms.  She was started on BiDil at last office visit but d/c it due to headache and vision changes.  She also stopped Marcelline Deist because one of the ER physicians had told her it for diabetic patients.  ALLERGIES: Allergies  Allergen Reactions   Sodium Ferric Gluconate [Ferrous Gluconate] Other (See Comments)    Patient had near syncope with abdominal pain and diaphoresis about an hour after IV ferric gluconate although it was while she was on commode having bowel movements    Imdur [Isosorbide Nitrate]     headache   Tetracyclines & Related Itching    MEDICATION LIST PRIOR TO VISIT: Current Meds  Medication Sig   acetaminophen (TYLENOL) 325 MG tablet Take 2 tablets (650 mg total) by mouth every 6 (six) hours as needed.   albuterol (VENTOLIN HFA) 108 (90 Base) MCG/ACT inhaler Inhale 2 puffs into the lungs every 6 (six) hours as needed for wheezing or shortness of breath.   apixaban (ELIQUIS) 5 MG TABS tablet Take 1 tablet (5 mg total) by mouth 2 (two) times daily.   atorvastatin (LIPITOR) 20 MG tablet TAKE 1 TABLET (20 MG TOTAL) BY MOUTH AT BEDTIME FOR CHOLESTEROL   Blood Pressure Monitoring (B-D ASSURE BPM/AUTO ARM CUFF) MISC Use to test blood pressure daily. Dx:I10   BREO ELLIPTA 200-25 MCG/ACT AEPB Inhale 1 puff into the lungs daily.   cholecalciferol (VITAMIN D3) 25 MCG (1000 UNIT) tablet Take 1,000 Units by mouth daily.   dapagliflozin propanediol (FARXIGA) 10 MG TABS tablet Take 1 tablet (10 mg total) by mouth daily before breakfast.   metoprolol succinate (TOPROL-XL) 25 MG 24 hr tablet TAKE 1 TABLET BY MOUTH DAILY. TAKE WITH OR IMMEDIATELY FOLLOWING A MEAL.   OXYGEN Inhale 2 L/min into the lungs continuous.   sacubitril-valsartan (ENTRESTO) 24-26 MG Take 1 tablet by mouth 2 (two) times daily.   traZODone (DESYREL) 50 MG tablet Take 1 tablet (50 mg total) by mouth at bedtime.     PAST MEDICAL HISTORY: Past Medical History:  Diagnosis Date   Asthma    Bloody stool    CHF (congestive heart failure) (HCC)  Chronic diastolic heart failure (HCC)    Dental decay    Gastritis, Helicobacter pylori 06/22/2015   Hypertension    Paroxysmal atrial fibrillation (HCC)    Pneumonia 06/21/2015   Prepyloric ulcer 06/22/2015   Seasonal allergies     PAST SURGICAL HISTORY: Past Surgical History:  Procedure Laterality Date   BARTHOLIN CYST MARSUPIALIZATION Left 1997   BIOPSY  11/04/2021   Procedure: BIOPSY;  Surgeon: Kerin Salen, MD;  Location: [facility]  ENDOSCOPY;  Service: Gastroenterology;;   BUBBLE STUDY  10/31/2021   Procedure: BUBBLE STUDY;  Surgeon: Tessa Lerner, DO;  Location: [facility] ENDOSCOPY;  Service: Cardiovascular;;   CARDIAC SURGERY N/A 1963   Repair of PFO   CARDIOVERSION N/A 10/31/2021   Procedure: CARDIOVERSION;  Surgeon: Tessa Lerner, DO;  Location: [facility] ENDOSCOPY;  Service: Cardiovascular;  Laterality: N/A;   COLONOSCOPY N/A 2002   Performed secondary to mother's diagnosis of colon CA at age 62   COLONOSCOPY WITH PROPOFOL N/A 11/04/2021   Procedure: COLONOSCOPY WITH PROPOFOL;  Surgeon: Kerin Salen, MD;  Location: [facility] ENDOSCOPY;  Service: Gastroenterology;  Laterality: N/A;   ESOPHAGOGASTRODUODENOSCOPY (EGD) WITH PROPOFOL N/A 06/22/2015   Procedure: ESOPHAGOGASTRODUODENOSCOPY (EGD) WITH PROPOFOL;  Surgeon: Willis Modena, MD;  Location: [name] ENDOSCOPY;  Service: Endoscopy;  Laterality: N/A;   ESOPHAGOGASTRODUODENOSCOPY (EGD) WITH PROPOFOL N/A 11/04/2021   Procedure: ESOPHAGOGASTRODUODENOSCOPY (EGD) WITH PROPOFOL;  Surgeon: Kerin Salen, MD;  Location: [facility] ENDOSCOPY;  Service: Gastroenterology;  Laterality: N/A;   FRACTURE SURGERY     NASAL SINUS SURGERY Bilateral 1984   OVARIAN CYST REMOVAL Left 1990   POLYPECTOMY  11/04/2021   Procedure: POLYPECTOMY;  Surgeon: Kerin Salen, MD;  Location: [facility] ENDOSCOPY;  Service: Gastroenterology;;   TEE WITHOUT CARDIOVERSION N/A 10/31/2021   Procedure: TRANSESOPHAGEAL ECHOCARDIOGRAM (TEE);  Surgeon: Tessa Lerner, DO;  Location: [facility] ENDOSCOPY;  Service: Cardiovascular;  Laterality: N/A;   TUBAL LIGATION     UMBILICAL HERNIA REPAIR N/A 2005    FAMILY HISTORY: The patient family history includes Cancer (age of onset: 59) in her father; Cerebral aneurysm in her sister; Colon cancer (age of onset: 79) in her mother; Goiter in her sister; Hypertension in her father and mother.  SOCIAL HISTORY:  The patient  reports that she has never smoked. She has never used smokeless tobacco. She reports that she does not  currently use alcohol. She reports that she does not use drugs.  REVIEW OF SYSTEMS: Review of Systems  Cardiovascular:  Positive for dyspnea on exertion. Negative for chest pain, leg swelling, near-syncope, palpitations, paroxysmal nocturnal dyspnea and syncope.  Respiratory:  Positive for shortness of breath.   Hematologic/Lymphatic: Negative for bleeding problem.    PHYSICAL EXAM:    02/20/2023    9:39 AM 02/16/2023    1:47 PM 01/14/2023    2:52 PM  Vitals with BMI  Height 5\' 3"  5\' 3"    Weight 198 lbs 196 lbs   BMI 35.08 34.73   Systolic 145 134 846  Diastolic 72 82 110  Pulse 57 63     Physical Exam  Constitutional: No distress.  Age appropriate, hemodynamically stable.   Neck: No JVD present.  Cardiovascular: Normal rate, regular rhythm, S1 normal, S2 normal, intact distal pulses and normal pulses. Exam reveals no gallop, no S3 and no S4.  No murmur heard. Pulmonary/Chest: Effort normal and breath sounds normal. No stridor. She has no wheezes. She has no rales.  Abdominal: Soft. Bowel sounds are normal. She exhibits no distension. There is no abdominal tenderness.  Musculoskeletal:  General: No edema.     Cervical back: Neck supple.  Neurological: She is alert and oriented to person, place, and time. She has intact cranial nerves (2-12).  Skin: Skin is warm and moist.    CARDIAC DATABASE: EKG: Nov 21, 2022: Sinus bradycardia, 55 bpm, LAE, nonspecific ST-T changes.   Echocardiogram: 03/19/2021: LVEF 60-65%, moderate LVH, grade 2 diastolic dysfunction, elevated LAP, moderately dilated left atrium, mild MR, mild AR, estimated RAP 15 mmHg.   10/31/2020 TEE + DDCV: LVEF 25-30%, severely reduced, mild LVH, regional wall motion abnormality, RV function low normal, size is normal, no left atrial appendage thrombi, mild to moderate MR, mild plaque in the descending/ascending aorta.  Bubble study negative.  EKG during visit TEE A-fib with RVR.  Underwent direct-current  cardioversion 200 J synchronized x1 restored NSR.   11/01/2020:  1. Left ventricular ejection fraction, by estimation, is 50 to 55%. The  left ventricle has low normal function. The left ventricle has no regional  wall motion abnormalities. There is mild left ventricular hypertrophy.  Left ventricular diastolic  parameters are consistent with Grade III diastolic dysfunction  (restrictive). Elevated left atrial pressure.   2. Right ventricular systolic function is normal. The right ventricular  size is normal. There is mildly elevated pulmonary artery systolic  pressure.   3. Left atrial size was mildly dilated.   4. The mitral valve is grossly normal. Mild mitral valve regurgitation.  No evidence of mitral stenosis.   5. The aortic valve is grossly normal. Aortic valve regurgitation is  mild. No aortic stenosis is present.   6. The inferior vena cava is normal in size with greater than 50%  respiratory variability, suggesting right atrial pressure of 3 mmHg. 7. While in NSR.    Stress Testing: Regadenoson Nuclear stress test 11/13/2022: Normal myocardial perfusion with mild apical thinning. Overall LV systolic function is normal without regional wall motion abnormalities. Stress LV EF: 59%.  Non-diagnostic ECG stress. The heart rate response was consistent with Regadenoson.  No previous exam available for comparison. Low risk.   Heart Catheterization: None  LABORATORY DATA:    Latest Ref Rng & Units 02/16/2023    2:19 PM 12/18/2022    9:02 PM 10/05/2022    7:37 PM  CBC  WBC 3.8 - 10.8 Thousand/uL 5.0  6.4  9.9   Hemoglobin 11.7 - 15.5 g/dL 82.9  56.2  13.0   Hematocrit 35.0 - 45.0 % 35.6  34.7  38.5   Platelets 140 - 400 Thousand/uL 203  227  204        Latest Ref Rng & Units 02/16/2023    2:19 PM 12/18/2022    9:02 PM 12/08/2022   12:00 AM  CMP  Glucose 65 - 99 mg/dL 80  97    BUN 7 - 25 mg/dL 6  11  11       Creatinine 0.50 - 1.05 mg/dL 8.65  7.84  0.7      Sodium 135 -  146 mmol/L 140  137  142      Potassium 3.5 - 5.3 mmol/L 3.8  3.0  3.9      Chloride 98 - 110 mmol/L 103  102  103      CO2 20 - 32 mmol/L 27  23  26       Calcium 8.6 - 10.4 mg/dL 9.1  9.2  9.8      Total Protein 6.1 - 8.1 g/dL 6.3     Total Bilirubin 0.2 -  1.2 mg/dL 0.9     AST 10 - 35 U/L 12     ALT 6 - 29 U/L 11        This result is from an external source.    Lipid Panel  Lab Results  Component Value Date   CHOL 168 02/16/2023   HDL 52 02/16/2023   LDLCALC 101 (H) 02/16/2023   TRIG 60 02/16/2023   CHOLHDL 3.2 02/16/2023     No components found for: "NTPROBNP" No results for input(s): "PROBNP" in the last 8760 hours. Recent Labs    03/17/22 0000 08/20/22 0000  TSH 1.60 0.47     BMP Recent Labs    10/04/22 1355 10/05/22 1937 12/08/22 0000 12/18/22 2102 02/16/23 1419  NA 137 137 142 137 140  K 3.2* 3.2* 3.9 3.0* 3.8  CL 103 99 103 102 103  CO2 26 27 26* 23 27  GLUCOSE 93 112*  --  97 80  BUN 6* 9 11 11  6*  CREATININE 0.57 0.75 0.7 0.79 0.42*  CALCIUM 8.9 9.3 9.8 9.2 9.1  GFRNONAA >60 >60  --  >60  --     HEMOGLOBIN A1C Lab Results  Component Value Date   HGBA1C 5.6 10/30/2021   MPG 114.02 10/30/2021    IMPRESSION:    ICD-10-CM   1. Chronic heart failure with preserved ejection fraction (HFpEF) (HCC)  I50.32 dapagliflozin propanediol (FARXIGA) 10 MG TABS tablet    Basic metabolic panel    2. Heart failure with improved ejection fraction (HFimpEF) (HCC)  I50.32     3. Paroxysmal atrial fibrillation (HCC)  I48.0     4. Long term (current) use of anticoagulants  Z79.01     5. Mixed hyperlipidemia  E78.2     6. Preop cardiovascular exam  Z01.810         RECOMMENDATIONS: [name] [name] is a 62 y.o. African-American female whose past medical history and cardiac risk factors include: Paroxysmal atrial fibrillation status post direct-current cardioversion, Hypertension, asthma, heart failure with improved EF / HFpEF,  sleep apnea not on CPAP,  COPD, history of alcohol abuse, postmenopausal female.  Chronic heart failure with preserved ejection fraction (HFpEF) (HCC) Heart failure with improved ejection fraction (HFimpEF) (HCC) Stage C, NYHA class II April 2023: LVEF 25-30% see report for additional details. Follow-up echo noted improvement in LVEF to 50-55%, grade 3 diastolic dysfunction, see report for additional details. Medications reconciled. Will restart prior dose of Farxiga 10 mg p.o. daily.  Labs in 1 week. Was not able to tolerate BiDil secondary to headaches and vision changes.  I suspect it was likely secondary to the isosorbide component. Reemphasized the importance of secondary prevention with focus on improving her modifiable cardiovascular risk factors such as glycemic control, lipid management, blood pressure control, weight loss.  Paroxysmal atrial fibrillation (HCC) Rate control: Metoprolol. Rhythm control: N/A. Thromboembolic prophylaxis: Eliquis Status post TEE guided cardioversion April 2023-200 J x 1 Was on amiodarone in the past but discontinued given her other chronic pulmonary conditions  Long term (current) use of anticoagulants Indication: Paroxysmal atrial fibrillation. Does not endorse evidence of bleeding. Risk, benefits, and alternatives to anticoagulation discussed. Patient has also stopped drinking alcohol which has significantly improved her A-fib burden and reduce the risk of bleeding.  She is congratulated on the efforts  Mixed hyperlipidemia Currently on Lipitor 20 mg p.o. nightly  At the last office visit she requested clearance for right total hip arthroplasty this is still pending.  Her overall  cardiovascular risk remains the same.  She still considered acceptable risk for upcoming noncardiac surgery.  She is requested to hold Farxiga 3 days prior to surgery and Eliquis 2 days prior to surgery.  FINAL MEDICATION LIST END OF ENCOUNTER: Meds ordered this encounter  Medications    dapagliflozin propanediol (FARXIGA) 10 MG TABS tablet    Sig: Take 1 tablet (10 mg total) by mouth daily before breakfast.    Dispense:  90 tablet    Refill:  0    There are no discontinued medications.    Current Outpatient Medications:    acetaminophen (TYLENOL) 325 MG tablet, Take 2 tablets (650 mg total) by mouth every 6 (six) hours as needed., Disp: 36 tablet, Rfl: 0   albuterol (VENTOLIN HFA) 108 (90 Base) MCG/ACT inhaler, Inhale 2 puffs into the lungs every 6 (six) hours as needed for wheezing or shortness of breath., Disp: 8.5 g, Rfl: 11   apixaban (ELIQUIS) 5 MG TABS tablet, Take 1 tablet (5 mg total) by mouth 2 (two) times daily., Disp: 180 tablet, Rfl: 1   atorvastatin (LIPITOR) 20 MG tablet, TAKE 1 TABLET (20 MG TOTAL) BY MOUTH AT BEDTIME FOR CHOLESTEROL, Disp: 90 tablet, Rfl: 1   Blood Pressure Monitoring (B-D ASSURE BPM/AUTO ARM CUFF) MISC, Use to test blood pressure daily. Dx:I10, Disp: 1 each, Rfl: 0   BREO ELLIPTA 200-25 MCG/ACT AEPB, Inhale 1 puff into the lungs daily., Disp: , Rfl:    cholecalciferol (VITAMIN D3) 25 MCG (1000 UNIT) tablet, Take 1,000 Units by mouth daily., Disp: , Rfl:    dapagliflozin propanediol (FARXIGA) 10 MG TABS tablet, Take 1 tablet (10 mg total) by mouth daily before breakfast., Disp: 90 tablet, Rfl: 0   metoprolol succinate (TOPROL-XL) 25 MG 24 hr tablet, TAKE 1 TABLET BY MOUTH DAILY. TAKE WITH OR IMMEDIATELY FOLLOWING A MEAL., Disp: 90 tablet, Rfl: 1   OXYGEN, Inhale 2 L/min into the lungs continuous., Disp: , Rfl:    sacubitril-valsartan (ENTRESTO) 24-26 MG, Take 1 tablet by mouth 2 (two) times daily., Disp: 60 tablet, Rfl: 0   traZODone (DESYREL) 50 MG tablet, Take 1 tablet (50 mg total) by mouth at bedtime., Disp: 30 tablet, Rfl: 1  Orders Placed This Encounter  Procedures   Basic metabolic panel    There are no Patient Instructions on file for this visit.   --Continue cardiac medications as reconciled in final medication list. --Return  in about 3 months (around 05/23/2023) for Follow up, heart failure management.. Or sooner if needed. --Continue follow-up with your primary care physician regarding the management of your other chronic comorbid conditions.  Patient's questions and concerns were addressed to her satisfaction. She voices understanding of the instructions provided during this encounter.   This note was created using a voice recognition software as a result there may be grammatical errors inadvertently enclosed that do not reflect the nature of this encounter. Every attempt is made to correct such errors.  Tessa Lerner, [place], [facility]  Pager:  [phone] Office: [phone]

## 2023-02-25 ENCOUNTER — Encounter: Payer: Self-pay | Admitting: Internal Medicine

## 2023-02-25 ENCOUNTER — Telehealth: Payer: Self-pay

## 2023-02-25 ENCOUNTER — Ambulatory Visit (INDEPENDENT_AMBULATORY_CARE_PROVIDER_SITE_OTHER): Payer: 59 | Admitting: Internal Medicine

## 2023-02-25 VITALS — BP 126/80 | HR 63 | Temp 97.8°F | Resp 18 | Ht 63.0 in | Wt 198.6 lb

## 2023-02-25 DIAGNOSIS — R059 Cough, unspecified: Secondary | ICD-10-CM

## 2023-02-25 DIAGNOSIS — J3489 Other specified disorders of nose and nasal sinuses: Secondary | ICD-10-CM

## 2023-02-25 DIAGNOSIS — I5032 Chronic diastolic (congestive) heart failure: Secondary | ICD-10-CM | POA: Diagnosis not present

## 2023-02-25 DIAGNOSIS — J449 Chronic obstructive pulmonary disease, unspecified: Secondary | ICD-10-CM

## 2023-02-25 LAB — POC COVID19 BINAXNOW: SARS Coronavirus 2 Ag: NEGATIVE

## 2023-02-25 MED ORDER — PREDNISONE 10 MG PO TABS: 10.0000 mg | ORAL_TABLET | Freq: Every day | ORAL | 0 refills | Status: DC

## 2023-02-25 MED ORDER — AZITHROMYCIN 250 MG PO TABS: ORAL_TABLET | ORAL | 0 refills | Status: AC

## 2023-02-25 NOTE — Telephone Encounter (Signed)
Patient called stating that she is having chest congestion and cough for about a week. She denies having any fever ot any other symptoms. She was requesting something sent in to the pharmacy. She states that she had COVID last month.I am waiting for patient to call back and let me know if she will be able to come in for an appointment this afternoon.  Message sent to [name] Holes, NP

## 2023-02-25 NOTE — Progress Notes (Signed)
Location:     Place of Service:     Provider:   Code Status:  Goals of Care:     02/19/2023    9:38 AM  Advanced Directives  Does Patient Have a Medical Advance Directive? Yes  Type of Advance Directive Out of facility DNR (pink MOST or yellow form)  Does patient want to make changes to medical advance directive? No - Patient declined     Chief Complaint  Patient presents with   Acute Visit    Patient is being seen for headache,congestion, cough and runny nose for a week    Immunizations    Patient is being seen for shingles vaccine    HPI: Patient is a 62 y.o. female seen today for an acute visit for Cough Congestion  Patient with h/o COPD and CHF and PAF  She diagnosed with Covid 2 weeks ago Took Paxlovid Got little better but for past few days having cough with productive sputum Fatigue ,Congested No Fever Little SOB  Appetite is good no Nausea or Vomiting No PND No Chest pain    Past Medical History:  Diagnosis Date   Asthma    Bloody stool    CHF (congestive heart failure) (HCC)    Chronic diastolic heart failure (HCC)    Dental decay    Gastritis, Helicobacter pylori 06/22/2015   Hypertension    Paroxysmal atrial fibrillation (HCC)    Pneumonia 06/21/2015   Prepyloric ulcer 06/22/2015   Seasonal allergies     Past Surgical History:  Procedure Laterality Date   BARTHOLIN CYST MARSUPIALIZATION Left 1997   BIOPSY  11/04/2021   Procedure: BIOPSY;  Surgeon: Kerin Salen, MD;  Location: [facility];  Service: Gastroenterology;;   Thressa Sheller STUDY  10/31/2021   Procedure: BUBBLE STUDY;  Surgeon: Tessa Lerner, DO;  Location: [facility];  Service: Cardiovascular;;   CARDIAC SURGERY N/A 1963   Repair of PFO   CARDIOVERSION N/A 10/31/2021   Procedure: CARDIOVERSION;  Surgeon: Tessa Lerner, DO;  Location: [facility];  Service: Cardiovascular;  Laterality: N/A;   COLONOSCOPY N/A 2002   Performed secondary to mother's diagnosis of colon CA at age 25    COLONOSCOPY WITH PROPOFOL N/A 11/04/2021   Procedure: COLONOSCOPY WITH PROPOFOL;  Surgeon: Kerin Salen, MD;  Location: [facility];  Service: Gastroenterology;  Laterality: N/A;   ESOPHAGOGASTRODUODENOSCOPY (EGD) WITH PROPOFOL N/A 06/22/2015   Procedure: ESOPHAGOGASTRODUODENOSCOPY (EGD) WITH PROPOFOL;  Surgeon: Willis Modena, MD;  Location: [facility];  Service: Endoscopy;  Laterality: N/A;   ESOPHAGOGASTRODUODENOSCOPY (EGD) WITH PROPOFOL N/A 11/04/2021   Procedure: ESOPHAGOGASTRODUODENOSCOPY (EGD) WITH PROPOFOL;  Surgeon: Kerin Salen, MD;  Location: [facility];  Service: Gastroenterology;  Laterality: N/A;   FRACTURE SURGERY     NASAL SINUS SURGERY Bilateral 1984   OVARIAN CYST REMOVAL Left 1990   POLYPECTOMY  11/04/2021   Procedure: POLYPECTOMY;  Surgeon: Kerin Salen, MD;  Location: [facility];  Service: Gastroenterology;;   TEE WITHOUT CARDIOVERSION N/A 10/31/2021   Procedure: TRANSESOPHAGEAL ECHOCARDIOGRAM (TEE);  Surgeon: Tessa Lerner, DO;  Location: [facility];  Service: Cardiovascular;  Laterality: N/A;   TUBAL LIGATION     UMBILICAL HERNIA REPAIR N/A 2005    Allergies  Allergen Reactions   Sodium Ferric Gluconate [Ferrous Gluconate] Other (See Comments)    Patient had near syncope with abdominal pain and diaphoresis about an hour after IV ferric gluconate although it was while she was on commode having bowel movements   Imdur [Isosorbide Nitrate]     headache  Tetracyclines & Related Itching    Outpatient Encounter Medications as of 02/25/2023  Medication Sig   acetaminophen (TYLENOL) 325 MG tablet Take 2 tablets (650 mg total) by mouth every 6 (six) hours as needed.   albuterol (VENTOLIN HFA) 108 (90 Base) MCG/ACT inhaler Inhale 2 puffs into the lungs every 6 (six) hours as needed for wheezing or shortness of breath.   apixaban (ELIQUIS) 5 MG TABS tablet Take 1 tablet (5 mg total) by mouth 2 (two) times daily.   atorvastatin (LIPITOR) 20 MG tablet TAKE 1 TABLET (20 MG  TOTAL) BY MOUTH AT BEDTIME FOR CHOLESTEROL   azithromycin (ZITHROMAX) 250 MG tablet Take 2 tablets on day 1, then 1 tablet daily on days 2 through 5   Blood Pressure Monitoring (B-D ASSURE BPM/AUTO ARM CUFF) MISC Use to test blood pressure daily. Dx:I10   BREO ELLIPTA 200-25 MCG/ACT AEPB Inhale 1 puff into the lungs daily.   cholecalciferol (VITAMIN D3) 25 MCG (1000 UNIT) tablet Take 1,000 Units by mouth daily.   dapagliflozin propanediol (FARXIGA) 10 MG TABS tablet Take 1 tablet (10 mg total) by mouth daily before breakfast.   metoprolol succinate (TOPROL-XL) 25 MG 24 hr tablet TAKE 1 TABLET BY MOUTH DAILY. TAKE WITH OR IMMEDIATELY FOLLOWING A MEAL.   OXYGEN Inhale 2 L/min into the lungs continuous.   predniSONE (DELTASONE) 10 MG tablet Take 1 tablet (10 mg total) by mouth daily with breakfast. Take 4 tablets for 3 days 3 tablets for 3 days and 2 tablets for 3 days 1 tablet for 3 days and then half tablet for 4 days and then stop   sacubitril-valsartan (ENTRESTO) 24-26 MG Take 1 tablet by mouth 2 (two) times daily.   traZODone (DESYREL) 50 MG tablet Take 1 tablet (50 mg total) by mouth at bedtime.   No facility-administered encounter medications on file as of 02/25/2023.    Review of Systems:  Review of Systems  Constitutional:  Positive for activity change. Negative for appetite change.  HENT: Negative.    Respiratory:  Positive for cough and wheezing. Negative for shortness of breath.   Cardiovascular:  Negative for leg swelling.  Gastrointestinal:  Negative for constipation.  Genitourinary: Negative.   Musculoskeletal:  Negative for arthralgias, gait problem and myalgias.  Skin: Negative.   Neurological:  Positive for weakness. Negative for dizziness.  Psychiatric/Behavioral:  Negative for confusion, dysphoric mood and sleep disturbance.     Health Maintenance  Topic Date Due   PAP SMEAR-Modifier  Never done   Zoster Vaccines- Shingrix (1 of 2) Never done   INFLUENZA VACCINE   10/19/2023 (Originally 02/19/2023)   Medicare Annual Wellness (AWV)  02/19/2024   MAMMOGRAM  07/31/2024   DTaP/Tdap/Td (2 - Td or Tdap) 03/26/2025   Colonoscopy  11/05/2031   Hepatitis C Screening  Completed   HIV Screening  Completed   HPV VACCINES  Aged Out   COVID-19 Vaccine  Discontinued    Physical Exam: Vitals:   02/25/23 1600  BP: 126/80  Pulse: 63  Resp: 18  Temp: 97.8 F (36.6 C)  TempSrc: Temporal  SpO2: 97%  Weight: 198 lb 9.6 oz (90.1 kg)  Height: 5\' 3"  (1.6 m)   Body mass index is 35.18 kg/m. Physical Exam Vitals reviewed.  Constitutional:      Appearance: Normal appearance.  HENT:     Head: Normocephalic.     Nose: Nose normal.     Mouth/Throat:     Mouth: Mucous membranes are moist.  Pharynx: Oropharynx is clear.  Eyes:     Pupils: Pupils are equal, round, and reactive to light.  Cardiovascular:     Rate and Rhythm: Normal rate and regular rhythm.     Pulses: Normal pulses.     Heart sounds: Normal heart sounds. No murmur heard. Pulmonary:     Effort: Pulmonary effort is normal.     Breath sounds: Normal breath sounds.     Comments: Few rales and Expiratory wheezing Abdominal:     General: Abdomen is flat. Bowel sounds are normal.     Palpations: Abdomen is soft.  Musculoskeletal:        General: No swelling.     Cervical back: Neck supple.  Skin:    General: Skin is warm.  Neurological:     General: No focal deficit present.     Mental Status: She is alert and oriented to person, place, and time.  Psychiatric:        Mood and Affect: Mood normal.        Thought Content: Thought content normal.     Labs reviewed: Basic Metabolic Panel: Recent Labs    03/17/22 0000 03/31/22 1239 04/03/22 0413 08/20/22 0000 10/04/22 1355 10/05/22 1937 12/08/22 0000 12/18/22 2102 02/16/23 1419  NA 140   < > 140 140   < > 137 142 137 140  K 3.2*   < > 3.2* 3.5   < > 3.2* 3.9 3.0* 3.8  CL 92*   < > 98 104   < > 99 103 102 103  CO2 34*   < > 35*  28*   < > 27 26* 23 27  GLUCOSE  --    < > 126*  --    < > 112*  --  97 80  BUN 9   < > 20 7   < > 9 11 11  6*  CREATININE 0.9   < > 0.74 0.6   < > 0.75 0.7 0.79 0.42*  CALCIUM 9.0   < > 8.9 9.2   < > 9.3 9.8 9.2 9.1  MG  --   --  1.8  --   --   --   --   --   --   TSH 1.60  --   --  0.47  --   --   --   --   --    < > = values in this interval not displayed.   Liver Function Tests: Recent Labs    08/20/22 0000 10/04/22 1355 10/05/22 1937 12/08/22 0000 02/16/23 1419  AST 12* 17 17  --  12  ALT 15 13 12   --  11  ALKPHOS 57 69 63  --   --   BILITOT  --  1.3* 1.3*  --  0.9  PROT  --  7.4 7.3  --  6.3  ALBUMIN 4.0 4.0 3.9 4.5  --    Recent Labs    10/04/22 1355 10/05/22 1937  LIPASE 23 22   No results for input(s): "AMMONIA" in the last 8760 hours. CBC: Recent Labs    10/04/22 1355 10/05/22 1937 12/18/22 2102 02/16/23 1419  WBC 11.0* 9.9 6.4 5.0  NEUTROABS 8.2* 7.1  --  2,480  HGB 13.2 12.6 11.5* 11.5*  HCT 40.9 38.5 34.7* 35.6  MCV 88.7 88.1 85.7 88.6  PLT 213 204 227 203   Lipid Panel: Recent Labs    03/17/22 0000 08/20/22 0000 02/16/23 1419  CHOL 213* 136  168  HDL 67 54 52  LDLCALC 120 69 101*  TRIG 138 54 60  CHOLHDL  --   --  3.2   Lab Results  Component Value Date   HGBA1C 5.6 10/30/2021    Procedures since last visit: No results found.  Assessment/Plan 1. Cough, unspecified type Covid Negative - POC COVID-19  2. Stuffy and runny nose Mucinex prn - POC COVID-19  3. Chronic obstructive pulmonary disease, unspecified COPD type (HCC) Start on Prednisone 40 mg Tape rover 10 days Zithromax   4. Chronic diastolic CHF (congestive heart failure) (HCC) Continue on Entresto Also on Farxiga Will get her Blood work on  Fri 5 PAF On Toprol and Eliquis   Labs/tests ordered:  * No order type specified * Next appt:  06/15/2023

## 2023-02-25 NOTE — Telephone Encounter (Signed)
Patient called back and scheduled appointment for this afternoon with Dr. Einar Crow

## 2023-02-27 ENCOUNTER — Ambulatory Visit (HOSPITAL_COMMUNITY)
Admission: EM | Admit: 2023-02-27 | Discharge: 2023-02-27 | Disposition: A | Discharge status: Home / Self Care | Payer: 59 | Attending: Physician Assistant | Admitting: Physician Assistant

## 2023-02-27 ENCOUNTER — Encounter (HOSPITAL_COMMUNITY): Payer: Self-pay

## 2023-02-27 ENCOUNTER — Ambulatory Visit (HOSPITAL_COMMUNITY): Payer: 59

## 2023-02-27 DIAGNOSIS — R059 Cough, unspecified: Secondary | ICD-10-CM | POA: Diagnosis not present

## 2023-02-27 DIAGNOSIS — J441 Chronic obstructive pulmonary disease with (acute) exacerbation: Secondary | ICD-10-CM

## 2023-02-27 MED ORDER — AMOXICILLIN-POT CLAVULANATE 875-125 MG PO TABS: 1.0000 | ORAL_TABLET | Freq: Two times a day (BID) | ORAL | 0 refills | Status: DC

## 2023-02-27 NOTE — ED Provider Notes (Signed)
[facility]    CSN: [id number] Arrival date & time: 02/27/23  1813      History   Chief Complaint Chief Complaint  Patient presents with   Cough   Shortness of Breath    HPI [name] [name] is a 62 y.o. female.   Patient presents today companied by her daughter who provide the majority of history.  Reports that 2 weeks ago she had COVID and was treated with Paxlovid.  She then continued to have shortness of breath and cough and was seen by her primary care most recently 02/25/2023.  The diagnosis over the COPD exacerbation and she was started on azithromycin and prednisone.  She has taken the azithromycin but was concerned about potential side effects with prednisone after reading the pamphlet as she does have a history of heart failure.  She is prescribed Breo Ellipta and has been compliant with this maintenance medication.  She denies additional antibiotics in the past 90 days.  She is having difficulty with her daily activities as result of symptoms.  She does have oxygen at home but has not been using this regularly.  She has not been monitoring her oxygen saturation but does have a pulse oximeter.    Past Medical History:  Diagnosis Date   Asthma    Bloody stool    CHF (congestive heart failure) (HCC)    Chronic diastolic heart failure (HCC)    Dental decay    Gastritis, Helicobacter pylori 06/22/2015   Hypertension    Paroxysmal atrial fibrillation (HCC)    Pneumonia 06/21/2015   Prepyloric ulcer 06/22/2015   Seasonal allergies     Patient Active Problem List   Diagnosis Date Noted   Severe sleep apnea 05/06/2022   Nocturnal hypoxia 01/01/2022   Esophageal candidiasis (HCC) 11/04/2021   Bandemia 10/31/2021   Hyponatremia 10/31/2021   Obesity, Class III, BMI 40-49.9 (morbid obesity) (HCC) 10/30/2021   Euthyroid sick syndrome 10/30/2021   Paroxysmal atrial fibrillation (HCC) 10/27/2021   Hypertension    Iron deficiency anemia    Snoring 03/28/2021    Chronic left hip pain 03/12/2021   Chronic left shoulder pain 03/12/2021   Mild intermittent asthma without complication 03/12/2021   Lump of axilla, left 03/12/2021   Anxiety with depression 10/06/2017   Healthcare maintenance 10/06/2017   Osteoarthritis, multiple sites 10/06/2017   Alcohol use disorder 07/06/2015   Prepyloric ulcer 06/22/2015   Elevated blood pressure reading with diagnosis of hypertension 04/14/2015   Seasonal allergies 04/14/2015    Past Surgical History:  Procedure Laterality Date   BARTHOLIN CYST MARSUPIALIZATION Left 1997   BIOPSY  11/04/2021   Procedure: BIOPSY;  Surgeon: Kerin Salen, MD;  Location: [facility];  Service: Gastroenterology;;   Thressa Sheller STUDY  10/31/2021   Procedure: BUBBLE STUDY;  Surgeon: Tessa Lerner, DO;  Location: [facility];  Service: Cardiovascular;;   CARDIAC SURGERY N/A 1963   Repair of PFO   CARDIOVERSION N/A 10/31/2021   Procedure: CARDIOVERSION;  Surgeon: Tessa Lerner, DO;  Location: [facility];  Service: Cardiovascular;  Laterality: N/A;   COLONOSCOPY N/A 2002   Performed secondary to mother's diagnosis of colon CA at age 54   COLONOSCOPY WITH PROPOFOL N/A 11/04/2021   Procedure: COLONOSCOPY WITH PROPOFOL;  Surgeon: Kerin Salen, MD;  Location: [facility];  Service: Gastroenterology;  Laterality: N/A;   ESOPHAGOGASTRODUODENOSCOPY (EGD) WITH PROPOFOL N/A 06/22/2015   Procedure: ESOPHAGOGASTRODUODENOSCOPY (EGD) WITH PROPOFOL;  Surgeon: Willis Modena, MD;  Location[facility] ENDOSCOPY;  Service: Endoscopy;  Laterality:  N/A;   ESOPHAGOGASTRODUODENOSCOPY (EGD) WITH PROPOFOL N/A 11/04/2021   Procedure: ESOPHAGOGASTRODUODENOSCOPY (EGD) WITH PROPOFOL;  Surgeon: Kerin Salen, MD;  Location: [facility];  Service: Gastroenterology;  Laterality: N/A;   FRACTURE SURGERY     NASAL SINUS SURGERY Bilateral 1984   OVARIAN CYST REMOVAL Left 1990   POLYPECTOMY  11/04/2021   Procedure: POLYPECTOMY;  Surgeon: Kerin Salen, MD;  Location: [facility];   Service: Gastroenterology;;   TEE WITHOUT CARDIOVERSION N/A 10/31/2021   Procedure: TRANSESOPHAGEAL ECHOCARDIOGRAM (TEE);  Surgeon: Tessa Lerner, DO;  Location: [facility];  Service: Cardiovascular;  Laterality: N/A;   TUBAL LIGATION     UMBILICAL HERNIA REPAIR N/A 2005    OB History   No obstetric history on file.      Home Medications    Prior to Admission medications   Medication Sig Start Date End Date Taking? Authorizing Provider  acetaminophen (TYLENOL) 325 MG tablet Take 2 tablets (650 mg total) by mouth every 6 (six) hours as needed. 10/04/22  Yes Tanda Rockers A, DO  albuterol (VENTOLIN HFA) 108 (90 Base) MCG/ACT inhaler Inhale 2 puffs into the lungs every 6 (six) hours as needed for wheezing or shortness of breath. 02/16/23  Yes [name], NP  amoxicillin-clavulanate (AUGMENTIN) 875-125 MG tablet Take 1 tablet by mouth every 12 (twelve) hours. 02/27/23  Yes , Denny Peon K, PA-C  apixaban (ELIQUIS) 5 MG TABS tablet Take 1 tablet (5 mg total) by mouth 2 (two) times daily. 01/14/23  Yes [name], NP  atorvastatin (LIPITOR) 20 MG tablet TAKE 1 TABLET (20 MG TOTAL) BY MOUTH AT BEDTIME FOR CHOLESTEROL 01/14/23  Yes [name], NP  azithromycin (ZITHROMAX) 250 MG tablet Take 2 tablets on day 1, then 1 tablet daily on days 2 through 5 02/25/23 03/02/23 Yes Mahlon Gammon, MD  Blood Pressure Monitoring (B-D ASSURE BPM/AUTO ARM CUFF) MISC Use to test blood pressure daily. Dx:I10 02/19/23  Yes Eubanks, Janene Harvey, NP  BREO ELLIPTA 200-25 MCG/ACT AEPB Inhale 1 puff into the lungs daily. 03/26/22  Yes [provider]  cholecalciferol (VITAMIN D3) 25 MCG (1000 UNIT) tablet Take 1,000 Units by mouth daily.   Yes [provider]  dapagliflozin propanediol (FARXIGA) 10 MG TABS tablet Take 1 tablet (10 mg total) by mouth daily before breakfast. 02/20/23 05/21/23 Yes Tolia, Sunit, DO  metoprolol succinate (TOPROL-XL) 25 MG 24 hr tablet TAKE 1 TABLET BY MOUTH DAILY.  TAKE WITH OR IMMEDIATELY FOLLOWING A MEAL. 02/06/23  Yes [name], NP  OXYGEN Inhale 2 L/min into the lungs continuous.   Yes [provider]  sacubitril-valsartan (ENTRESTO) 24-26 MG Take 1 tablet by mouth 2 (two) times daily. 01/14/23  Yes [name], NP  traZODone (DESYREL) 50 MG tablet Take 1 tablet (50 mg total) by mouth at bedtime. 03/11/21  Yes Mayers, Cari S, PA-C  predniSONE (DELTASONE) 10 MG tablet Take 1 tablet (10 mg total) by mouth daily with breakfast. Take 4 tablets for 3 days 3 tablets for 3 days and 2 tablets for 3 days 1 tablet for 3 days and then half tablet for 4 days and then stop 02/25/23   Mahlon Gammon, MD    Family History Family History  Problem Relation Age of Onset   Colon cancer Mother 58   Hypertension Mother    Cancer Father 9       liver cancer   Hypertension Father    Cerebral aneurysm Sister    Goiter Sister  Breast cancer Neg Hx     Social History Social History   Tobacco Use   Smoking status: Never   Smokeless tobacco: Never  Vaping Use   Vaping status: Never Used  Substance Use Topics   Alcohol use: Not Currently    Comment: previously 3-4 beers per day   Drug use: No     Allergies   Sodium ferric gluconate [ferrous gluconate], Imdur [isosorbide nitrate], and Tetracyclines & related   Review of Systems Review of Systems  Constitutional:  Positive for activity change and fatigue. Negative for appetite change and fever.  HENT:  Positive for congestion. Negative for sinus pressure, sneezing and sore throat.   Respiratory:  Positive for cough and shortness of breath.   Cardiovascular:  Negative for chest pain.  Gastrointestinal:  Negative for abdominal pain, diarrhea, nausea and vomiting.  Neurological:  Negative for dizziness, light-headedness and headaches.     Physical Exam Triage Vital Signs ED Triage Vitals [02/27/23 1921]  Encounter Vitals Group     BP (!) 178/96     Systolic BP Percentile       Diastolic BP Percentile      Pulse Rate 62     Resp 18     Temp 98.7 F (37.1 C)     Temp Source Oral     SpO2 96 %     Weight      Height      Head Circumference      Peak Flow      Pain Score      Pain Loc      Pain Education      Exclude from Growth Chart    No data found.  Updated Vital Signs BP (!) 161/90 (BP Location: Left Arm)   Pulse 60   Temp 98.7 F (37.1 C) (Oral)   Resp 18   SpO2 96%   Visual Acuity Right Eye Distance:   Left Eye Distance:   Bilateral Distance:    Right Eye Near:   Left Eye Near:    Bilateral Near:     Physical Exam Vitals reviewed.  Constitutional:      General: She is awake. She is not in acute distress.    Appearance: Normal appearance. She is well-developed. She is not ill-appearing.     Comments: Very pleasant female appears stated age in no acute distress sitting comfortably in exam room  HENT:     Head: Normocephalic and atraumatic.     Right Ear: Tympanic membrane, ear canal and external ear normal. Tympanic membrane is not erythematous or bulging.     Left Ear: Tympanic membrane, ear canal and external ear normal. Tympanic membrane is not erythematous or bulging.     Nose:     Right Sinus: No maxillary sinus tenderness or frontal sinus tenderness.     Left Sinus: No maxillary sinus tenderness or frontal sinus tenderness.     Mouth/Throat:     Pharynx: Uvula midline. No oropharyngeal exudate or posterior oropharyngeal erythema.  Cardiovascular:     Rate and Rhythm: Normal rate and regular rhythm.     Heart sounds: S1 normal and S2 normal. Murmur heard.  Pulmonary:     Effort: Pulmonary effort is normal.     Breath sounds: Normal breath sounds. No wheezing, rhonchi or rales.     Comments: Clear to auscultation bilaterally Lymphadenopathy:     Head:     Right side of head: No submental, submandibular or tonsillar adenopathy.  Left side of head: No submental, submandibular or tonsillar adenopathy.     Cervical: No  cervical adenopathy.  Psychiatric:        Behavior: Behavior is cooperative.      UC Treatments / Results  Labs (all labs ordered are listed, but only abnormal results are displayed) Labs Reviewed - No data to display  EKG   Radiology DG Chest 2 View  Result Date: 02/27/2023 CLINICAL DATA:  Worsening cough, COVID positive 2 weeks ago EXAM: CHEST - 2 VIEW COMPARISON:  Previous studies including the examination of 12/18/2022 FINDINGS: Transverse diameter of heart is increased. There are calcified nodes in mediastinum. Lung fields are clear of any infiltrates or pulmonary edema. There is no pleural effusion or pneumothorax. Deformities are noted in view left upper ribs suggesting previous intervention or previous trauma. IMPRESSION: There are no signs of pulmonary edema or focal pulmonary consolidation. Electronically Signed   By: Ernie Avena M.D.   On: 02/27/2023 19:54    Procedures Procedures (including critical care time)  Medications Ordered in UC Medications - No data to display  Initial Impression / Assessment and Plan / UC Course  I have reviewed the triage vital signs and the nursing notes.  Pertinent labs & imaging results that were available during my care of the patient were reviewed by me and considered in my medical decision making (see chart for details).     Patient is well-appearing, afebrile, nontoxic, nontachycardic.  Chest x-ray was obtained given her recent COVID infection with worsening cough that showed no acute cardiopulmonary disease.  We discussed that I am concerned she has COPD exacerbation given her clinical presentation.  Given she continues to have significant cough we will add Augmentin for additional coverage.  We did discuss that the benefits likely outweigh the risks of starting prednisone the patient is very hesitant to use this.  Discussed that if her symptoms do not improve quickly (within 24 hours) with the additional antibiotic she should  start the prednisone but can do a burst rather than a long taper of 40 mg for 4 days.  She is to continue her [facility] as previously prescribed.  Recommended she use over-the-counter medications including Mucinex, Flonase, Tylenol.  She can use nasal saline and sinus rinses for additional symptom relief.  She has nocturnal oxygen and was encouraged to use this.  Also recommended that she obtain a pulse oximeter and monitor oxygen saturation at home.  If this drops below 90% she is to go to the emergency room.  Recommend close follow-up with her primary care.  Discussed that if she has any worsening symptoms she needs to be seen immediately.  All questions were answered to patient and daughter satisfaction.  Final Clinical Impressions(s) / UC Diagnoses   Final diagnoses:  COPD exacerbation [facility])     Discharge Instructions      Your x-ray was normal with no evidence of pneumonia.  I am concerned that you have a COPD exacerbation as we discussed.  I would like you to start Augmentin for additional bacterial coverage.  As we discussed, the benefits of taking steroids likely outweighs the risks.  You can start with 40 mg for 3 days as prescribed by your primary care and then if your symptoms have significantly improved stop the medication at that point.  Get a pulse oximeter and monitor your oxygen saturation at home.  If this is below 90% you need to go to the emergency room.  If your symptoms  or not improving within a week or if anything worsens you need to be seen immediately.  Follow-up with your primary care soon as possible.     ED Prescriptions     Medication Sig Dispense Auth. Provider   amoxicillin-clavulanate (AUGMENTIN) 875-125 MG tablet Take 1 tablet by mouth every 12 (twelve) hours. 14 tablet , Noberto Retort, PA-C      PDMP not reviewed this encounter.   Jeani Hawking, PA-C 02/27/23 2038

## 2023-02-27 NOTE — ED Triage Notes (Signed)
Pt states she is here for cough and SOB. Pt was seen her PCP on 02/25/2023 and prescribed Azithromycin and Prednisone but did not start due to side effects of medication.

## 2023-02-27 NOTE — Discharge Instructions (Signed)
Your x-ray was normal with no evidence of pneumonia.  I am concerned that you have a COPD exacerbation as we discussed.  I would like you to start Augmentin for additional bacterial coverage.  As we discussed, the benefits of taking steroids likely outweighs the risks.  You can start with 40 mg for 3 days as prescribed by your primary care and then if your symptoms have significantly improved stop the medication at that point.  Get a pulse oximeter and monitor your oxygen saturation at home.  If this is below 90% you need to go to the emergency room.  If your symptoms or not improving within a week or if anything worsens you need to be seen immediately.  Follow-up with your primary care soon as possible.

## 2023-03-02 ENCOUNTER — Other Ambulatory Visit: Payer: Self-pay

## 2023-03-02 DIAGNOSIS — I5032 Chronic diastolic (congestive) heart failure: Secondary | ICD-10-CM

## 2023-03-05 ENCOUNTER — Other Ambulatory Visit: Payer: Self-pay

## 2023-03-05 DIAGNOSIS — I5032 Chronic diastolic (congestive) heart failure: Secondary | ICD-10-CM

## 2023-03-05 MED ORDER — ENTRESTO 24-26 MG PO TABS
1.0000 | ORAL_TABLET | Freq: Two times a day (BID) | ORAL | 1 refills | Status: DC
Start: 2023-03-05 — End: 2023-10-21

## 2023-03-05 NOTE — Telephone Encounter (Signed)
Patient called requesting refill on Entresto.

## 2023-03-12 ENCOUNTER — Ambulatory Visit: Payer: 59 | Attending: Nurse Practitioner | Admitting: Audiology

## 2023-03-12 DIAGNOSIS — H903 Sensorineural hearing loss, bilateral: Secondary | ICD-10-CM

## 2023-03-12 NOTE — Procedures (Signed)
  [facility] [address] [place], [place]  [address] [phone]  AUDIOLOGICAL  EVALUATION  NAME: [name] [name]     DOB:   1961-05-25      MRN: [id number]                                                                                     DATE: 03/12/2023     REFERENT: [name], NP STATUS: Outpatient DIAGNOSIS: Sensorineural hearing loss  History: [name] was seen for an audiological evaluation due to concerns regarding her hearing sensitivity.  [name] reports her daughter has concerns about [name] hearing.  She denies otalgia aural fullness and dizziness.  [name] reports intermittent tinnitus.  Evaluation:  Otoscopy showed a clear view of the tympanic membranes, bilaterally Tympanometry results were consistent with normal middle ear pressure and normal tympanic membrane mobility in both ears. Audiometric testing was completed using Conventional Audiometry techniques with insert earphones and TDH headphones. Test results are consistent in the right ear with normal hearing sensitivity with the exception of a mild hearing loss at 3000 Hz and 8000 Hz.  Test results are consistent in the left ear with normal hearing sensitivity sloping to a mild hearing loss at 3000 to 8000 Hz.  Speech Recognition Thresholds were obtained at 20 dB HL in the right ear and at 20  dB HL in the left ear. Word Recognition Testing was completed at 70 dB HL and [name] scored 100%, bilaterally.    Results:  The test results were reviewed with [name]. Test results are consistent in the right ear with normal hearing sensitivity with the exception of a mild hearing loss at 3000 Hz and 8000 Hz.  Test results are consistent in the left ear with normal hearing sensitivity sloping to a mild hearing loss at 3000 to 8000 Hz.  [name] may have hearing and communication difficulty in adverse listening environments such as a noisy setting. Hearing aids are not recommended at this time.  She  will benefit from the use of good communication strategies.  Recommendations: 1.   Audiological monitoring is recommended.  Return in 2 to 3 years for an audiological evaluation.   30 minutes spent testing and counseling on results.   If you have any questions please feel free to contact me at [phone].  Marton Redwood Audiologist, Au.D., CCC-A 03/12/2023  11:58 AM  Cc: [name], NP

## 2023-03-20 ENCOUNTER — Telehealth: Payer: Self-pay

## 2023-03-20 NOTE — Telephone Encounter (Signed)
Patient would like to know if it is okay for her to take Tylenol arthritis along with all her other medications.  Message sent to Abbey Chatters, NP

## 2023-03-20 NOTE — Telephone Encounter (Signed)
Yes okay to take tylenol. Make sure she does not take more than 3000 mg in 24 hours

## 2023-03-20 NOTE — Telephone Encounter (Signed)
Patient notified and agreed.  

## 2023-03-30 ENCOUNTER — Other Ambulatory Visit: Payer: Self-pay | Admitting: *Deleted

## 2023-03-30 MED ORDER — BREO ELLIPTA 200-25 MCG/ACT IN AEPB: 1.0000 | INHALATION_SPRAY | Freq: Every day | RESPIRATORY_TRACT | 3 refills | Status: DC

## 2023-03-30 NOTE — Telephone Encounter (Signed)
Patient called requesting refill

## 2023-04-06 ENCOUNTER — Telehealth: Payer: Self-pay | Admitting: *Deleted

## 2023-04-06 NOTE — Telephone Encounter (Signed)
Patient notified and agreed.  

## 2023-04-06 NOTE — Telephone Encounter (Signed)
She will need to go through her orthopedic for pain medication until surgery

## 2023-04-06 NOTE — Telephone Encounter (Signed)
Patient called and stated that she is having a lot of Hip Pain and her Hip Replacement Surgery is NOT until 04/22/2023. Not able to hardly Move.   Patient is requesting Oxycodone to be called into pharmacy to help relieve her pain until her surgery.   Not getting any relief from Tylenol. Tramadol.  Patient had some Oxycodone from when she had Shingles and it helped.

## 2023-04-08 ENCOUNTER — Ambulatory Visit: Payer: Self-pay | Admitting: Student

## 2023-04-08 NOTE — Progress Notes (Signed)
Sent message, via epic in basket, requesting orders in epic from surgeon.  

## 2023-04-09 NOTE — Progress Notes (Addendum)
COVID Vaccine received:  [x]  No []  Yes Date of any COVID positive Test in last 90 days:  Mid July,  took Paxlovid  PCP - Abbey Chatters, NP   [facility]  [phone] Cardiologist - Tessa Lerner, DO  Chest x-ray - 02-27-2023  2v  Epic EKG - 12-18-2022  Epic  Stress Test - Eugenie Birks  11-13-2022  Epic ECHO - TEE 10-31-2021  Epic   with Bubble study Cardiac Cath -   PCR screen: [x]  Ordered & Completed           []   No Order but Needs PROFEND           []   N/A for this surgery  Surgery Plan:  []  Ambulatory   [x]  Outpatient in bed  []  Admit  Anesthesia:    []  General  [x]  Spinal  []   Choice []   MAC  Pacemaker / ICD device [x]  No []  Yes   Spinal Cord Stimulator:[x]  No []  Yes       History of Sleep Apnea? []  No [x]  Yes   CPAP used?- [x]  No []  Yes   also uses Home O2 at 2L per [place] prn.  Per patient, she only uses O2 occasionally  Does the patient monitor blood sugar?  []  No []  Yes  [x]  N/A Patient has: [x]  NO Hx DM   []  Pre-DM   []  DM1  []   DM2 Last A1c was:5.6 normal  on  10-30-2021     Blood Thinner / Instructions: Eliquis   Hold x 3 days per patient instructions from Dr. Linna Caprice Aspirin Instructions:  none  ERAS Protocol Ordered: []  No  [x]  Yes PRE-SURGERY [x]  ENSURE  []  G2   []  No Drink Ordered Patient is to be NPO after: 05:30  Comments: Patient was given the 5 CHG shower / bath instructions for THA surgery along with 2 bottles of the CHG soap. Patient will start this on: 04-18-23  All questions were asked and answered, Patient voiced understanding of this process.   Activity level: Patient is able to climb a flight of stairs without difficulty; [x]  No CP but would have SOB and leg pain. Patient can perform ADLs without assistance.   Anesthesia review: PAF (cardioversion 10-31-21), asthma, OSA-no CPAP  and Home O2 prn (2L per [place]) which she only uses occasionally , HTN, CHF, Had PFO repair at 62 yo,  Anemia  Patient denies shortness of breath, fever, cough and chest pain at PAT  appointment.  Patient verbalized understanding and agreement to the Pre-Surgical Instructions that were given to them at this PAT appointment. Patient was also educated of the need to review these PAT instructions again prior to her surgery.I reviewed the appropriate phone numbers to call if they have any and questions or concerns.

## 2023-04-09 NOTE — Patient Instructions (Addendum)
SURGICAL WAITING ROOM VISITATION Patients having surgery or a procedure may have no more than 2 support people in the waiting area - these visitors may rotate in the visitor waiting room.   Due to an increase in RSV and influenza rates and associated hospitalizations, children ages 1 and under may not visit patients in [facility] hospitals. If the patient needs to stay at the hospital during part of their recovery, the visitor guidelines for inpatient rooms apply.  PRE-OP VISITATION  Pre-op nurse will coordinate an appropriate time for 1 support person to accompany the patient in pre-op.  This support person may not rotate.  This visitor will be contacted when the time is appropriate for the visitor to come back in the pre-op area.  Please refer to the [facility] website for the visitor guidelines for Inpatients (after your surgery is over and you are in a regular room).  You are not required to quarantine at this time prior to your surgery. However, you must do this: Hand Hygiene often Do NOT share personal items Notify your provider if you are in close contact with someone who has COVID or you develop fever 100.4 or greater, new onset of sneezing, cough, sore throat, shortness of breath or body aches.  If you test positive for Covid or have been in contact with anyone that has tested positive in the last 10 days please notify you surgeon.    Your procedure is scheduled on:  [facility]   April 22, 2023  Report to [facility] Main Entrance: Leota Jacobsen entrance where the [employer] is available.   Report to admitting at:  06:00   AM  Call this number if you have any questions or problems the morning of surgery [phone]  Do not eat food after Midnight the night prior to your surgery/procedure.  After Midnight you may have the following liquids until   05:30 AM  DAY OF SURGERY  Clear Liquid Diet Water Black Coffee (sugar ok, NO MILK/CREAM OR CREAMERS)  Tea (sugar ok, NO  MILK/CREAM OR CREAMERS) regular and decaf                             Plain Jell-O  with no fruit (NO RED)                                           Fruit ices (not with fruit pulp, NO RED)                                     Popsicles (NO RED)                                                                  Juice: NO CITRUS JUICES: only apple, WHITE grape, WHITE cranberry Sports drinks like Gatorade or Powerade (NO RED)                   The day of surgery:  Drink ONE (1) Pre-Surgery Clear Ensure at   05:30 AM the  morning of surgery. Drink in one sitting. Do not sip.  This drink was given to you during your hospital pre-op appointment visit. Nothing else to drink after completing the Pre-Surgery Clear Ensure : No candy, chewing gum or throat lozenges.    FOLLOW ANY ADDITIONAL PRE OP INSTRUCTIONS YOU RECEIVED FROM YOUR SURGEON'S OFFICE!!!   Oral Hygiene is also important to reduce your risk of infection.        Remember - BRUSH YOUR TEETH THE MORNING OF SURGERY WITH YOUR REGULAR TOOTHPASTE  Do NOT smoke after Midnight the night before surgery.  STOP TAKING all Vitamins, Herbs and supplements 1 week before your surgery.   ELIQUIS:  Stop taking your Eliquis  ??  Days before your surgery. Take your last dose on :   Take ONLY these medicines the morning of surgery with A SIP OF WATER: Metoprolol.  You may use your Breo Ellipta and Albuterol inhalers if needed.  You may take Tylenol if needed for pain.    You may not have any metal on your body including hair pins, jewelry, and body piercing  Do not wear make-up, lotions, powders, perfumes or deodorant  Do not wear nail polish including gel and S&S, artificial / acrylic nails, or any other type of covering on natural nails including finger and toenails. If you have artificial nails, gel coating, etc., that needs to be removed by a nail salon, Please have this removed prior to surgery. Not doing so may mean that your surgery could be  cancelled or delayed if the Surgeon or anesthesia staff feels like they are unable to monitor you safely.   Do not shave 48 hours prior to surgery to avoid nicks in your skin which may contribute to postoperative infections.   Contacts, Hearing Aids, dentures or bridgework may not be worn into surgery. DENTURES WILL BE REMOVED PRIOR TO SURGERY PLEASE DO NOT APPLY "Poly grip" OR ADHESIVES!!!  You may bring a small overnight bag with you on the day of surgery, only pack items that are not valuable. [facility] IS NOT RESPONSIBLE   FOR VALUABLES THAT ARE LOST OR STOLEN.   Do not bring your home medications to the hospital. The Pharmacy will dispense medications listed on your medication list to you during your admission in the Hospital.   Please read over the following fact sheets you were given: IF YOU HAVE QUESTIONS ABOUT YOUR PRE-OP INSTRUCTIONS, PLEASE CALL [phone].     Pre-operative 5 CHG Bath Instructions   You can play a key role in reducing the risk of infection after surgery. Your skin needs to be as free of germs as possible. You can reduce the number of germs on your skin by washing with CHG (chlorhexidine gluconate) soap before surgery. CHG is an antiseptic soap that kills germs and continues to kill germs even after washing.   DO NOT use if you have an allergy to chlorhexidine/CHG or antibacterial soaps. If your skin becomes reddened or irritated, stop using the CHG and notify one of our RNs at [phone]  Please shower with the CHG soap starting 4 days before surgery using the following schedule: START SHOWERS ON  SATURDAY  April 18, 2023  Please keep in mind the following:  DO NOT shave, including legs and underarms, starting the day of your first shower.   You may shave your face at  any point before/day of surgery.   Place clean sheets on your bed the day you start using CHG soap. Use a clean washcloth (not used since being washed) for each shower. DO NOT sleep with pets once you start using the CHG.   CHG Shower Instructions:  If you choose to wash your hair and private area, wash first with your normal shampoo/soap.  After you use shampoo/soap, rinse your hair and body thoroughly to remove shampoo/soap residue.  Turn the water OFF and apply about 3 tablespoons (45 ml) of CHG soap to a CLEAN washcloth.  Apply CHG soap ONLY FROM YOUR NECK DOWN TO YOUR TOES (washing for 3-5 minutes)  DO NOT use CHG soap on face, private areas, open wounds, or sores.  Pay special attention to the area where your surgery is being performed.  If you are having back surgery, having someone wash your back for you may be helpful.  Wait 2 minutes after CHG soap is applied, then you may rinse off the CHG soap.  Pat dry with a clean towel  Put on clean clothes/pajamas   If you choose to wear lotion, please use ONLY the CHG-compatible lotions on the back of this paper.     Additional instructions for the day of surgery: DO NOT APPLY any lotions, deodorants, cologne, or perfumes.   Put on clean/comfortable clothes.  Brush your teeth.  Ask your nurse before applying any prescription medications to the skin.      CHG Compatible Lotions   Aveeno Moisturizing lotion  Cetaphil Moisturizing Cream  Cetaphil Moisturizing Lotion  Clairol Herbal Essence Moisturizing Lotion, Dry Skin  Clairol Herbal Essence Moisturizing Lotion, Extra Dry Skin  Clairol Herbal Essence Moisturizing Lotion, Normal Skin  Curel Age Defying Therapeutic Moisturizing Lotion with Alpha Hydroxy  Curel Extreme Care Body Lotion  Curel Soothing Hands Moisturizing Hand Lotion  Curel Therapeutic Moisturizing Cream, Fragrance-Free  Curel Therapeutic Moisturizing Lotion, Fragrance-Free  Curel Therapeutic Moisturizing Lotion,  Original Formula  Eucerin Daily Replenishing Lotion  Eucerin Dry Skin Therapy Plus Alpha Hydroxy Crme  Eucerin Dry Skin Therapy Plus Alpha Hydroxy Lotion  Eucerin Original Crme  Eucerin Original Lotion  Eucerin Plus Crme Eucerin Plus Lotion  Eucerin TriLipid Replenishing Lotion  Keri Anti-Bacterial Hand Lotion  Keri Deep Conditioning Original Lotion Dry Skin Formula Softly Scented  Keri Deep Conditioning Original Lotion, Fragrance Free Sensitive Skin Formula  Keri Lotion Fast Absorbing Fragrance Free Sensitive Skin Formula  Keri Lotion Fast Absorbing Softly Scented Dry Skin Formula  Keri Original Lotion  Keri Skin Renewal Lotion Keri Silky Smooth Lotion  Keri Silky Smooth Sensitive Skin Lotion  Nivea Body Creamy Conditioning Oil  Nivea Body Extra Enriched Lotion  Nivea Body Original Lotion  Nivea Body Sheer Moisturizing Lotion Nivea Crme  Nivea Skin Firming Lotion  NutraDerm 30 Skin Lotion  NutraDerm Skin Lotion  NutraDerm Therapeutic Skin Cream  NutraDerm Therapeutic Skin Lotion  ProShield Protective Hand Cream  Provon moisturizing lotion   FAILURE TO FOLLOW THESE INSTRUCTIONS MAY RESULT IN THE CANCELLATION OF YOUR SURGERY  PATIENT SIGNATURE_________________________________  NURSE SIGNATURE__________________________________  ________________________________________________________________________     Rogelia Mire    An incentive spirometer is a tool that can help keep your lungs clear and active. This tool measures how well you are filling your lungs with each breath. Taking long deep  breaths may help reverse or decrease the chance of developing breathing (pulmonary) problems (especially infection) following: A long period of time when you are unable to move or be active. BEFORE THE PROCEDURE  If the spirometer includes an indicator to show your best effort, your nurse or respiratory therapist will set it to a desired goal. If possible, sit up straight or  lean slightly forward. Try not to slouch. Hold the incentive spirometer in an upright position. INSTRUCTIONS FOR USE  Sit on the edge of your bed if possible, or sit up as far as you can in bed or on a chair. Hold the incentive spirometer in an upright position. Breathe out normally. Place the mouthpiece in your mouth and seal your lips tightly around it. Breathe in slowly and as deeply as possible, raising the piston or the ball toward the top of the column. Hold your breath for 3-5 seconds or for as long as possible. Allow the piston or ball to fall to the bottom of the column. Remove the mouthpiece from your mouth and breathe out normally. Rest for a few seconds and repeat Steps 1 through 7 at least 10 times every 1-2 hours when you are awake. Take your time and take a few normal breaths between deep breaths. The spirometer may include an indicator to show your best effort. Use the indicator as a goal to work toward during each repetition. After each set of 10 deep breaths, practice coughing to be sure your lungs are clear. If you have an incision (the cut made at the time of surgery), support your incision when coughing by placing a pillow or rolled up towels firmly against it. Once you are able to get out of bed, walk around indoors and cough well. You may stop using the incentive spirometer when instructed by your caregiver.  RISKS AND COMPLICATIONS Take your time so you do not get dizzy or light-headed. If you are in pain, you may need to take or ask for pain medication before doing incentive spirometry. It is harder to take a deep breath if you are having pain. AFTER USE Rest and breathe slowly and easily. It can be helpful to keep track of a log of your progress. Your caregiver can provide you with a simple table to help with this. If you are using the spirometer at home, follow these instructions: SEEK MEDICAL CARE IF:  You are having difficultly using the spirometer. You have  trouble using the spirometer as often as instructed. Your pain medication is not giving enough relief while using the spirometer. You develop fever of 100.5 F (38.1 C) or higher.                                                                                                    SEEK IMMEDIATE MEDICAL CARE IF:  You cough up bloody sputum that had not been present before. You develop fever of 102 F (38.9 C) or greater. You develop worsening pain at or near the incision site. MAKE SURE YOU:  Understand these instructions. Will watch your condition. Will get  help right away if you are not doing well or get worse. Document Released: 11/17/2006 Document Revised: 09/29/2011 Document Reviewed: 01/18/2007 [facility] Patient Information 2014 [place], [place].      WHAT IS A BLOOD TRANSFUSION? Blood Transfusion Information  A transfusion is the replacement of blood or some of its parts. Blood is made up of multiple cells which provide different functions. Red blood cells carry oxygen and are used for blood loss replacement. White blood cells fight against infection. Platelets control bleeding. Plasma helps clot blood. Other blood products are available for specialized needs, such as hemophilia or other clotting disorders. BEFORE THE TRANSFUSION  Who gives blood for transfusions?  Healthy volunteers who are fully evaluated to make sure their blood is safe. This is blood bank blood. Transfusion therapy is the safest it has ever been in the practice of medicine. Before blood is taken from a donor, a complete history is taken to make sure that person has no history of diseases nor engages in risky social behavior (examples are intravenous drug use or sexual activity with multiple partners). The donor's travel history is screened to minimize risk of transmitting infections, such as malaria. The donated blood is tested for signs of infectious diseases, such as HIV and hepatitis. The blood is then tested  to be sure it is compatible with you in order to minimize the chance of a transfusion reaction. If you or a relative donates blood, this is often done in anticipation of surgery and is not appropriate for emergency situations. It takes many days to process the donated blood. RISKS AND COMPLICATIONS Although transfusion therapy is very safe and saves many lives, the main dangers of transfusion include:  Getting an infectious disease. Developing a transfusion reaction. This is an allergic reaction to something in the blood you were given. Every precaution is taken to prevent this. The decision to have a blood transfusion has been considered carefully by your caregiver before blood is given. Blood is not given unless the benefits outweigh the risks. AFTER THE TRANSFUSION Right after receiving a blood transfusion, you will usually feel much better and more energetic. This is especially true if your red blood cells have gotten low (anemic). The transfusion raises the level of the red blood cells which carry oxygen, and this usually causes an energy increase. The nurse administering the transfusion will monitor you carefully for complications. HOME CARE INSTRUCTIONS  No special instructions are needed after a transfusion. You may find your energy is better. Speak with your caregiver about any limitations on activity for underlying diseases you may have. SEEK MEDICAL CARE IF:  Your condition is not improving after your transfusion. You develop redness or irritation at the intravenous (IV) site. SEEK IMMEDIATE MEDICAL CARE IF:  Any of the following symptoms occur over the next 12 hours: Shaking chills. You have a temperature by mouth above 102 F (38.9 C), not controlled by medicine. Chest, back, or muscle pain. People around you feel you are not acting correctly or are confused. Shortness of breath or difficulty breathing. Dizziness and fainting. You get a rash or develop hives. You have a decrease  in urine output. Your urine turns a dark color or changes to pink, red, or brown. Any of the following symptoms occur over the next 10 days: You have a temperature by mouth above 102 F (38.9 C), not controlled by medicine. Shortness of breath. Weakness after normal activity. The white part of the eye turns yellow (jaundice). You have a decrease in  the amount of urine or are urinating less often. Your urine turns a dark color or changes to pink, red, or brown. Document Released: 07/04/2000 Document Revised: 09/29/2011 Document Reviewed: 02/21/2008 [facility] Patient Information 2014 [place], [place].  _______________________________________________________________________

## 2023-04-10 ENCOUNTER — Other Ambulatory Visit: Payer: Self-pay

## 2023-04-10 ENCOUNTER — Encounter (HOSPITAL_COMMUNITY): Payer: Self-pay

## 2023-04-10 ENCOUNTER — Encounter (HOSPITAL_COMMUNITY)
Admission: RE | Admit: 2023-04-10 | Discharge: 2023-04-10 | Disposition: A | Discharge status: Home / Self Care | Payer: 59 | Source: Ambulatory Visit | Attending: Orthopedic Surgery | Admitting: Orthopedic Surgery

## 2023-04-10 VITALS — BP 152/77 | HR 50 | Temp 98.7°F | Resp 18 | Ht 63.0 in | Wt 193.0 lb

## 2023-04-10 DIAGNOSIS — I10 Essential (primary) hypertension: Secondary | ICD-10-CM | POA: Insufficient documentation

## 2023-04-10 DIAGNOSIS — Z01812 Encounter for preprocedural laboratory examination: Secondary | ICD-10-CM | POA: Diagnosis not present

## 2023-04-10 DIAGNOSIS — F1010 Alcohol abuse, uncomplicated: Secondary | ICD-10-CM | POA: Insufficient documentation

## 2023-04-10 DIAGNOSIS — Z01818 Encounter for other preprocedural examination: Secondary | ICD-10-CM

## 2023-04-10 HISTORY — DX: Sleep apnea, unspecified: G47.30

## 2023-04-10 HISTORY — DX: Anxiety disorder, unspecified: F41.9

## 2023-04-10 HISTORY — DX: Anemia, unspecified: D64.9

## 2023-04-10 HISTORY — DX: Cardiac arrhythmia, unspecified: I49.9

## 2023-04-10 HISTORY — DX: Unspecified osteoarthritis, unspecified site: M19.90

## 2023-04-10 LAB — TYPE AND SCREEN
ABO/RH(D): O POS
Antibody Screen: NEGATIVE

## 2023-04-10 LAB — CBC
HCT: 39 % (ref 36.0–46.0)
Hemoglobin: 12.6 g/dL (ref 12.0–15.0)
MCH: 29.6 pg (ref 26.0–34.0)
MCHC: 32.3 g/dL (ref 30.0–36.0)
MCV: 91.8 fL (ref 80.0–100.0)
Platelets: 208 10*3/uL (ref 150–400)
RBC: 4.25 MIL/uL (ref 3.87–5.11)
RDW: 13.2 % (ref 11.5–15.5)
WBC: 5.4 10*3/uL (ref 4.0–10.5)
nRBC: 0 % (ref 0.0–0.2)

## 2023-04-10 LAB — COMPREHENSIVE METABOLIC PANEL
ALT: 13 U/L (ref 0–44)
AST: 15 U/L (ref 15–41)
Albumin: 3.8 g/dL (ref 3.5–5.0)
Alkaline Phosphatase: 52 U/L (ref 38–126)
Anion gap: 9 (ref 5–15)
BUN: 11 mg/dL (ref 8–23)
CO2: 26 mmol/L (ref 22–32)
Calcium: 9 mg/dL (ref 8.9–10.3)
Chloride: 104 mmol/L (ref 98–111)
Creatinine, Ser: 0.44 mg/dL (ref 0.44–1.00)
GFR, Estimated: >60 mL/min (ref >60)
Glucose, Bld: 92 mg/dL (ref 70–99)
Potassium: 4.2 mmol/L (ref 3.5–5.1)
Sodium: 139 mmol/L (ref 135–145)
Total Bilirubin: 0.9 mg/dL (ref 0.3–1.2)
Total Protein: 7 g/dL (ref 6.5–8.1)

## 2023-04-10 LAB — SURGICAL PCR SCREEN
MRSA, PCR: NEGATIVE
Staphylococcus aureus: POSITIVE — AB

## 2023-04-13 NOTE — Progress Notes (Signed)
Patient's PCR screen is positive for STAPH. Appropriate notes have been placed on the patient's chart. This note has been routed to Dr. Linna Caprice and Clint Bolder, PA  or review. The Patient's surgery is currently scheduled for: 04-22-2023 at [facility].  Rudean Haskell, BSN, CVRN-BC   Pre-Surgical Testing Nurse [facility]  [phone]

## 2023-04-15 NOTE — Anesthesia Preprocedure Evaluation (Addendum)
Anesthesia Evaluation  Patient identified by MRN, date of birth, ID band Patient awake    Reviewed: Allergy & Precautions, NPO status , Patient's Chart, lab work & pertinent test results  Airway Mallampati: II  TM Distance: >3 FB Neck ROM: Limited    Dental  (+) Dental Advisory Given, Missing   Pulmonary shortness of breath and with exertion, asthma , sleep apnea , COPD (oxygen at home 2LPM [place] ),  COPD inhaler and oxygen dependent Likely OSA, has not had sleep study yet Has been on oxygen at home for about 1 year now    Pulmonary exam normal breath sounds clear to auscultation       Cardiovascular hypertension, Pt. on medications pulmonary hypertension (mild pHTN)+CHF (grade 2 diastolic dysfunction)  + dysrhythmias (afib with RVR, rate in the 140s) Atrial Fibrillation + Valvular Problems/Murmurs AI and MR  Rhythm:Regular Rate:Normal  Echo (TTE) 11/01/2021 1. Left ventricular ejection fraction, by estimation, is 50 to 55%. The left ventricle has low normal function. The left ventricle has no regional wall motion abnormalities. There is mild left ventricular hypertrophy. Left ventricular diastolic parameters are consistent with Grade III diastolic dysfunction (restrictive). Elevated left atrial pressure.   2. Right ventricular systolic function is normal. The right ventricular size is normal. There is mildly elevated pulmonary artery systolic pressure.   3. Left atrial size was mildly dilated.   4. The mitral valve is grossly normal. Mild mitral valve regurgitation. No evidence of mitral stenosis.   5. The aortic valve is grossly normal. Aortic valve regurgitation is mild. No aortic stenosis is present.   6. The inferior vena cava is normal in size with greater than 50% respiratory variability, suggesting right atrial pressure of 3 mmHg.    Echo (TEE with cardioversion) 10/31/2021  1. Left ventricular ejection fraction, by estimation, is  25 to 30%. The left ventricle has severely decreased function. The left ventricle demonstrates regional wall motion abnormalities (see scoring diagram/findings for description). The left ventricular internal cavity size was mildly dilated. There is mild left ventricular hypertrophy.   2. Right ventricular systolic function is low normal. The right ventricular size is normal.   3. Left atrial size was mildly dilated. No left atrial/left atrial  appendage thrombus was detected. The LAA emptying velocity was 39 cm/s.   4. The mitral valve is grossly normal. Mild to moderate mitral valve regurgitation. No evidence of mitral stenosis.   5. Tricuspid valve regurgitation is mild to moderate.   6. The aortic valve is normal in structure. Aortic valve regurgitation is trivial. No aortic stenosis is present.   7. There is mild (Grade II) plaque involving the descending aorta and ascending aorta.   8. Agitated saline contrast bubble study was negative, with no evidence of any interatrial shunt.   9. Rhythm strip during this exam demostrated Afib with RVR.    Echo 02/2021  1. Left ventricular ejection fraction, by estimation, is 60 to 65%. The left ventricle has normal function. The left ventricle has no regional wall motion abnormalities. There is moderate concentric left ventricular hypertrophy. Left ventricular diastolic parameters are consistent with Grade II diastolic dysfunction (pseudonormalization). Elevated left atrial pressure.   2. Right ventricular systolic function is normal. The right ventricular size is normal. There is mildly elevated pulmonary artery systolic pressure.   3. Left atrial size was moderately dilated.   4. The mitral valve is normal in structure. Mild mitral valve  regurgitation. No evidence of mitral stenosis.   5. The  aortic valve is tricuspid. Aortic valve regurgitation is mild. No aortic stenosis is present.   6. The inferior vena cava is dilated in size with <50% respiratory  variability, suggesting right atrial pressure of 15 mmHg.    Neuro/Psych  PSYCHIATRIC DISORDERS Anxiety Depression    negative neurological ROS     GI/Hepatic PUD,,,(+)     substance abuse  alcohol use1 forty ounce per day, never had withdrawal    Endo/Other  BMI 43  Renal/GU negative Renal ROS     Musculoskeletal  (+) Arthritis , Osteoarthritis,    Abdominal  (+) + obese  Peds negative pediatric ROS (+)  Hematology  (+) Blood dyscrasia, anemia   Anesthesia Other Findings   Reproductive/Obstetrics                              Anesthesia Physical Anesthesia Plan  ASA: 3  Anesthesia Plan: General   Post-op Pain Management: Tylenol PO (pre-op)*   Induction:   PONV Risk Score and Plan: 3 and Treatment may vary due to age or medical condition, Ondansetron and Dexamethasone  Airway Management Planned: Oral ETT  Additional Equipment: None  Intra-op Plan:   Post-operative Plan: Extubation in OR  Informed Consent: I have reviewed the patients History and Physical, chart, labs and discussed the procedure including the risks, benefits and alternatives for the proposed anesthesia with the patient or authorized representative who has indicated his/her understanding and acceptance.     Dental advisory given  Plan Discussed with: CRNA  Anesthesia Plan Comments: (See PAT note 04/10/2023)         Anesthesia Quick Evaluation

## 2023-04-15 NOTE — Progress Notes (Signed)
Anesthesia Chart Review   Case: [id number] Date/Time: 04/22/23 0815   Procedure: TOTAL HIP ARTHROPLASTY ANTERIOR APPROACH (Right: Hip) - 140   Anesthesia type: Spinal   Pre-op diagnosis: Right hip osteoarthriits   Location: [facility] ROOM 07 / [facility]   Surgeons: Samson Frederic, MD       DISCUSSION:62 y.o. never smoker with h/o HTN, PAF (on Eliquis), CHF, COPD with prn use of home oxygen, sleep apnea, right hip OA scheduled for above procedure 04/22/2023 with Dr Samson Frederic.   Pt last seen by cardiology 02/20/23. Per OV note, "At the last visit she requested clearance for right total hip arthroplasty this is still pending. Her overall cardiovascular risk remains the same. She still considered acceptable risk for upcoming noncardiac surgery.  She is requested to hold Farxiga 3 days prior to surgery and Eliquis 2 days prior to surgery."  Pt was advised by Dr. Linna Caprice to hold Eliquis three days prior to procedure. Discussed with Dr. Heywood Footman, ok to hold for three days.   VS: BP (!) 152/77 Comment: right arm sitting  Pulse (!) 50   Temp 37.1 C (Oral)   Resp 18   Ht 5\' 3"  (1.6 m)   Wt 87.5 kg   SpO2 100%   BMI 34.19 kg/m   PROVIDERS: [name], NP is PCP   Tessa Lerner, MD is Cardiologist  LABS: Labs reviewed: Acceptable for surgery. (all labs ordered are listed, but only abnormal results are displayed)  Labs Reviewed  SURGICAL PCR SCREEN - Abnormal; Notable for the following components:      Result Value   Staphylococcus aureus POSITIVE (*)    All other components within normal limits  COMPREHENSIVE METABOLIC PANEL  CBC  TYPE AND SCREEN     IMAGES:   EKG:   CV: Regadenoson Nuclear stress test 11/13/2022: Normal myocardial perfusion with mild apical thinning. Overall LV systolic function is normal without regional wall motion abnormalities. Stress LV EF: 59%.  Non-diagnostic ECG stress. The heart rate response was consistent with Regadenoson.  No previous exam  available for comparison. Low risk.   Echo 11/01/2021 1. Left ventricular ejection fraction, by estimation, is 50 to 55%. The  left ventricle has low normal function. The left ventricle has no regional  wall motion abnormalities. There is mild left ventricular hypertrophy.  Left ventricular diastolic  parameters are consistent with Grade III diastolic dysfunction  (restrictive). Elevated left atrial pressure.   2. Right ventricular systolic function is normal. The right ventricular  size is normal. There is mildly elevated pulmonary artery systolic  pressure.   3. Left atrial size was mildly dilated.   4. The mitral valve is grossly normal. Mild mitral valve regurgitation.  No evidence of mitral stenosis.   5. The aortic valve is grossly normal. Aortic valve regurgitation is  mild. No aortic stenosis is present.   6. The inferior vena cava is normal in size with greater than 50%  respiratory variability, suggesting right atrial pressure of 3 mmHg  Past Medical History:  Diagnosis Date   Anemia    Anxiety    Arthritis    Asthma    Bloody stool    Chronic diastolic heart failure (HCC)    Dental decay    Dysrhythmia    Atrial flutter   Gastritis, Helicobacter pylori 06/22/2015   Hypertension    Paroxysmal atrial fibrillation (HCC)    Pneumonia 06/21/2015   Prepyloric ulcer 06/22/2015   Seasonal allergies    Sleep apnea  no CPAP    Past Surgical History:  Procedure Laterality Date   BARTHOLIN CYST MARSUPIALIZATION Left 07/22/1995   BIOPSY  11/04/2021   Procedure: BIOPSY;  Surgeon: Kerin Salen, MD;  Location: [facility];  Service: Gastroenterology;;   BUBBLE STUDY  10/31/2021   Procedure: BUBBLE STUDY;  Surgeon: Tessa Lerner, DO;  Location: [facility];  Service: Cardiovascular;;   CARDIAC SURGERY N/A 07/21/1961   Repair of PFO   CARDIOVERSION N/A 10/31/2021   Procedure: CARDIOVERSION;  Surgeon: Tessa Lerner, DO;  Location: [facility];  Service: Cardiovascular;   Laterality: N/A;   COLONOSCOPY N/A 07/21/2000   Performed secondary to mother's diagnosis of colon CA at age 10   COLONOSCOPY WITH PROPOFOL N/A 11/04/2021   Procedure: COLONOSCOPY WITH PROPOFOL;  Surgeon: Kerin Salen, MD;  [facility];  Service: Gastroenterology;  Laterality: N/A;   ESOPHAGOGASTRODUODENOSCOPY (EGD) WITH PROPOFOL N/A 06/22/2015   Procedure: ESOPHAGOGASTRODUODENOSCOPY (EGD) WITH PROPOFOL;  Surgeon: Willis Modena, MD;  Location: [facility]COPY;  Service: Endoscopy;  Laterality: N/A;   ESOPHAGOGASTRODUODENOSCOPY (EGD) WITH PROPOFOL N/A 11/04/2021   Procedure: ESOPHAGOGASTRODUODENOSCOPY (EGD) WITH PROPOFOL;  Surgeon: Kerin Salen, MD;  [facility];  Service: Gastroenterology;  Laterality: N/A;   FRACTURE SURGERY Left 2005   ORIF tibia   NASAL SINUS SURGERY Bilateral 07/21/1982   OVARIAN CYST REMOVAL Left 07/21/1988   POLYPECTOMY  11/04/2021   Procedure: POLYPECTOMY;  Surgeon: Kerin Salen, MD;  [facility];  Service: Gastroenterology;;   TEE WITHOUT CARDIOVERSION N/A 10/31/2021   Procedure: TRANSESOPHAGEAL ECHOCARDIOGRAM (TEE);  Surgeon: Tessa Lerner, DO;  Location: [facility];  Service: Cardiovascular;  Laterality: N/A;   TUBAL LIGATION     UMBILICAL HERNIA REPAIR N/A 07/22/2003    MEDICATIONS:  acetaminophen (TYLENOL) 325 MG tablet   albuterol (VENTOLIN HFA) 108 (90 Base) MCG/ACT inhaler   apixaban (ELIQUIS) 5 MG TABS tablet   atorvastatin (LIPITOR) 20 MG tablet   Blood Pressure Monitoring (B-D ASSURE BPM/AUTO ARM CUFF) MISC   BREO ELLIPTA 200-25 MCG/ACT AEPB   cholecalciferol (VITAMIN D3) 25 MCG (1000 UNIT) tablet   metoprolol succinate (TOPROL-XL) 25 MG 24 hr tablet   OXYGEN   sacubitril-valsartan (ENTRESTO) 24-26 MG   traZODone (DESYREL) 50 MG tablet   No current facility-administered medications for this encounter.     Jodell Cipro Ward, PA-C [facility] [phone]

## 2023-04-16 ENCOUNTER — Ambulatory Visit: Payer: Self-pay | Admitting: Student

## 2023-04-16 NOTE — H&P (Signed)
TOTAL HIP ADMISSION H&P  Patient is admitted for right total hip arthroplasty.  Subjective:  Chief Complaint: right hip pain  HPI: [name] [name], 62 y.o. female, has a history of pain and functional disability in the right hip(s) due to arthritis and patient has failed non-surgical conservative treatments for greater than 12 weeks to include NSAID's and/or analgesics.  Onset of symptoms was gradual starting 10 years ago with rapidlly worsening course since that time.The patient noted no past surgery on the right hip(s).  Patient currently rates pain in the right hip at 10 out of 10 with activity. Patient has night pain, worsening of pain with activity and weight bearing, trendelenberg gait, pain that interfers with activities of daily living, and pain with passive range of motion. Patient has evidence of subchondral cysts, subchondral sclerosis, periarticular osteophytes, and joint space narrowing by imaging studies. This condition presents safety issues increasing the risk of falls.  There is no current active infection.  Patient Active Problem List   Diagnosis Date Noted   Severe sleep apnea 05/06/2022   Nocturnal hypoxia 01/01/2022   Esophageal candidiasis (HCC) 11/04/2021   Bandemia 10/31/2021   Hyponatremia 10/31/2021   Obesity, Class III, BMI 40-49.9 (morbid obesity) (HCC) 10/30/2021   Euthyroid sick syndrome 10/30/2021   Paroxysmal atrial fibrillation (HCC) 10/27/2021   Hypertension    Iron deficiency anemia    Snoring 03/28/2021   Chronic left hip pain 03/12/2021   Chronic left shoulder pain 03/12/2021   Mild intermittent asthma without complication 03/12/2021   Lump of axilla, left 03/12/2021   Anxiety with depression 10/06/2017   Healthcare maintenance 10/06/2017   Osteoarthritis, multiple sites 10/06/2017   Alcohol use disorder 07/06/2015   Prepyloric ulcer 06/22/2015   Elevated blood pressure reading with diagnosis of hypertension 04/14/2015   Seasonal allergies  04/14/2015   Past Medical History:  Diagnosis Date   Anemia    Anxiety    Arthritis    Asthma    Bloody stool    Chronic diastolic heart failure (HCC)    Dental decay    Dysrhythmia    Atrial flutter   Gastritis, Helicobacter pylori 06/22/2015   Hypertension    Paroxysmal atrial fibrillation (HCC)    Pneumonia 06/21/2015   Prepyloric ulcer 06/22/2015   Seasonal allergies    Sleep apnea    no CPAP    Past Surgical History:  Procedure Laterality Date   BARTHOLIN CYST MARSUPIALIZATION Left 07/22/1995   BIOPSY  11/04/2021   Procedure: BIOPSY;  Surgeon: Kerin Salen, MD;  Location: [facility];  Service: Gastroenterology;;   BUBBLE STUDY  10/31/2021   Procedure: BUBBLE STUDY;  Surgeon: Tessa Lerner, DO;  Location: [facility];  Service: Cardiovascular;;   CARDIAC SURGERY N/A 07/21/1961   Repair of PFO   CARDIOVERSION N/A 10/31/2021   Procedure: CARDIOVERSION;  Surgeon: Tessa Lerner, DO;  Location[facility];  Service: Cardiovascular;  Laterality: N/A;   COLONOSCOPY N/A 07/21/2000   Performed secondary to mother's diagnosis of colon CA at age 28   COLONOSCOPY WITH PROPOFOL N/A 11/04/2021   Procedure: COLONOSCOPY WITH PROPOFOL;  Surgeon: Kerin Salen, MD;  [facility];  Service: Gastroenterology;  Laterality: N/A;   ESOPHAGOGASTRODUODENOSCOPY (EGD) WITH PROPOFOL N/A 06/22/2015   Procedure: ESOPHAGOGASTRODUODENOSCOPY (EGD) WITH PROPOFOL;  Surgeon: Willis Modena, MD;  Location: [facility] ENDOSCOPY;  Service: Endoscopy;  Laterality: N/A;   ESOPHAGOGASTRODUODENOSCOPY (EGD) WITH PROPOFOL N/A 11/04/2021   Procedure: ESOPHAGOGASTRODUODENOSCOPY (EGD) WITH PROPOFOL;  Surgeon: Kerin Salen, MD;  [facility];  Service: Gastroenterology;  Laterality: N/A;   FRACTURE SURGERY Left 2005   ORIF tibia   NASAL SINUS SURGERY Bilateral 07/21/1982   OVARIAN CYST REMOVAL Left 07/21/1988   POLYPECTOMY  11/04/2021   Procedure: POLYPECTOMY;  Surgeon: Kerin Salen, MD;  [facility];  Service: Gastroenterology;;   TEE WITHOUT CARDIOVERSION N/A 10/31/2021   Procedure: TRANSESOPHAGEAL ECHOCARDIOGRAM (TEE);  Surgeon: Tessa Lerner, DO;  Location: [facility];  Service: Cardiovascular;  Laterality: N/A;   TUBAL LIGATION     UMBILICAL HERNIA REPAIR N/A 07/22/2003    Current Outpatient Medications  Medication Sig Dispense Refill Last Dose   acetaminophen (TYLENOL) 325 MG tablet Take 2 tablets (650 mg total) by mouth every 6 (six) hours as needed. 36 tablet 0    albuterol (VENTOLIN HFA) 108 (90 Base) MCG/ACT inhaler Inhale 2 puffs into the lungs every 6 (six) hours as needed for wheezing or shortness of breath. 8.5 g 11    apixaban (ELIQUIS) 5 MG TABS tablet Take 1 tablet (5 mg total) by mouth 2 (two) times daily. 180 tablet 1    atorvastatin (LIPITOR) 20 MG tablet TAKE 1 TABLET (20 MG TOTAL) BY MOUTH AT BEDTIME FOR CHOLESTEROL 90 tablet 1    Blood Pressure Monitoring (B-D ASSURE BPM/AUTO ARM CUFF) MISC Use to test blood pressure daily. Dx:I10 1 each 0    BREO ELLIPTA 200-25 MCG/ACT AEPB Inhale 1 puff into the lungs daily. 60 each 3    cholecalciferol (VITAMIN D3) 25 MCG (1000 UNIT) tablet Take 1,000 Units by mouth in the morning.      metoprolol succinate (TOPROL-XL) 25 MG 24 hr tablet TAKE 1 TABLET BY MOUTH DAILY. TAKE WITH OR IMMEDIATELY FOLLOWING A MEAL. (Patient taking differently: Take 25 mg by mouth daily after lunch. TAKE WITH OR IMMEDIATELY FOLLOWING A MEAL.) 90 tablet 1    OXYGEN Inhale 2 L/min into the lungs as needed (wheezing/respiratory issues.).      sacubitril-valsartan (ENTRESTO) 24-26 MG Take 1 tablet by mouth 2 (two) times daily. 180 tablet 1    traZODone (DESYREL) 50 MG tablet Take 1 tablet (50 mg total) by mouth at bedtime. 30 tablet 1    No current facility-administered medications for this visit.   Allergies  Allergen Reactions   Sodium Ferric Gluconate [Ferrous Gluconate] Other (See Comments)    Patient had near syncope with abdominal pain and  diaphoresis about an hour after IV ferric gluconate although it was while she was on commode having bowel movements   Imdur [Isosorbide Nitrate]     headache   Tetracyclines & Related Itching    Social History   Tobacco Use   Smoking status: Never   Smokeless tobacco: Never  Substance Use Topics   Alcohol use: Not Currently    Comment: previously 3-4 beers per day, Stopped drinking in 2023    Family History  Problem Relation Age of Onset   Colon cancer Mother 64   Hypertension Mother    Cancer Father 63       liver cancer   Hypertension Father    Cerebral aneurysm Sister    Goiter Sister    Breast cancer Neg Hx      Review of Systems  Musculoskeletal:  Positive for arthralgias and gait problem.  All other systems reviewed and are negative.   Objective:  Physical Exam Constitutional:      Appearance: Normal appearance.  HENT:     Head: Normocephalic and atraumatic.     Nose: Nose normal.  Mouth/Throat:     Mouth: Mucous membranes are moist.     Pharynx: Oropharynx is clear.  Eyes:     Conjunctiva/sclera: Conjunctivae normal.  Cardiovascular:     Rate and Rhythm: Normal rate and regular rhythm.     Pulses: Normal pulses.     Heart sounds: Normal heart sounds.  Pulmonary:     Effort: Pulmonary effort is normal.     Breath sounds: Normal breath sounds.  Abdominal:     General: Abdomen is flat.     Palpations: Abdomen is soft.  Genitourinary:    Comments: deferred Musculoskeletal:     Cervical back: Normal range of motion and neck supple.     Comments: Examination of the right hip reveals no skin wounds or lesions. Severely restricted range of motion. Severe pain with internal and external rotation of her hip reported in her lateral hip. Mild trochanteric tenderness to palpation. Hip abductor strength 4/5.   Neurovascularly intact distally.   Skin:    General: Skin is warm and dry.     Capillary Refill: Capillary refill takes less than 2 seconds.   Neurological:     General: No focal deficit present.     Mental Status: She is alert and oriented to person, place, and time.  Psychiatric:        Mood and Affect: Mood normal.        Behavior: Behavior normal.        Thought Content: Thought content normal.        Judgment: Judgment normal.     Vital signs in last 24 hours: @VSRANGES @  Labs:   Estimated body mass index is 34.19 kg/m as calculated from the following:   Height as of 04/10/23: 5\' 3"  (1.6 m).   Weight as of 04/10/23: 87.5 kg.   Imaging Review Plain radiographs demonstrate severe degenerative joint disease of the right hip(s). The bone quality appears to be adequate for age and reported activity level.      Assessment/Plan:  End stage arthritis, right hip(s)  The patient history, physical examination, clinical judgement of the provider and imaging studies are consistent with end stage degenerative joint disease of the right hip(s) and total hip arthroplasty is deemed medically necessary. The treatment options including medical management, injection therapy, arthroscopy and arthroplasty were discussed at length. The risks and benefits of total hip arthroplasty were presented and reviewed. The risks due to aseptic loosening, infection, stiffness, dislocation/subluxation,  thromboembolic complications and other imponderables were discussed.  The patient acknowledged the explanation, agreed to proceed with the plan and consent was signed. Patient is being admitted for inpatient treatment for surgery, pain control, PT, OT, prophylactic antibiotics, VTE prophylaxis, progressive ambulation and ADL's and discharge planning.The patient is planning to be discharged home with HEP after an overnight stay.    Therapy Plans: HEP.  Disposition: Home with daughter Planned DVT Prophylaxis: Eliquis 5mg  BID DME needed: walker.  PCP: Cleared. Cardiology: Cleared. Stop farxiga and eliquis 3 days prior to surgery.  TXA:  IV Allergies:  - Iron - stomach pains.  - Tetracycline - itching.  - Imdur - headache.  Anesthesia Concerns: None.  BMI: 33.8 Last HgbA1c: Not diabetic.  Other: - OSA doesn't use CPAP.  - Afib, chronic eliquis use.  - Asthma, COPD - Nasal swab positive.  - Hydrocodone, zofran, methocarbamol.  - NO NSAIDs.  - 04/10/23: Hgb 12.6, K+ 4.2, Cr. 0.44.      Patient's anticipated LOS is less than 2 midnights, meeting  these requirements: - Younger than 40 - Lives within 1 hour of care - Has a competent adult at home to recover with post-op recover - NO history of  - Chronic pain requiring opiods  - Diabetes  - Coronary Artery Disease  - Heart failure  - Heart attack  - Stroke  - DVT/VTE  - Cardiac arrhythmia  - Respiratory Failure/COPD  - Renal failure  - Anemia  - Advanced Liver disease

## 2023-04-16 NOTE — H&P (View-Only) (Signed)
TOTAL HIP ADMISSION H&P  Patient is admitted for right total hip arthroplasty.  Subjective:  Chief Complaint: right hip pain  HPI: [name] [name], 62 y.o. female, has a history of pain and functional disability in the right hip(s) due to arthritis and patient has failed non-surgical conservative treatments for greater than 12 weeks to include NSAID's and/or analgesics.  Onset of symptoms was gradual starting 10 years ago with rapidlly worsening course since that time.The patient noted no past surgery on the right hip(s).  Patient currently rates pain in the right hip at 10 out of 10 with activity. Patient has night pain, worsening of pain with activity and weight bearing, trendelenberg gait, pain that interfers with activities of daily living, and pain with passive range of motion. Patient has evidence of subchondral cysts, subchondral sclerosis, periarticular osteophytes, and joint space narrowing by imaging studies. This condition presents safety issues increasing the risk of falls.  There is no current active infection.  Patient Active Problem List   Diagnosis Date Noted   Severe sleep apnea 05/06/2022   Nocturnal hypoxia 01/01/2022   Esophageal candidiasis (HCC) 11/04/2021   Bandemia 10/31/2021   Hyponatremia 10/31/2021   Obesity, Class III, BMI 40-49.9 (morbid obesity) (HCC) 10/30/2021   Euthyroid sick syndrome 10/30/2021   Paroxysmal atrial fibrillation (HCC) 10/27/2021   Hypertension    Iron deficiency anemia    Snoring 03/28/2021   Chronic left hip pain 03/12/2021   Chronic left shoulder pain 03/12/2021   Mild intermittent asthma without complication 03/12/2021   Lump of axilla, left 03/12/2021   Anxiety with depression 10/06/2017   Healthcare maintenance 10/06/2017   Osteoarthritis, multiple sites 10/06/2017   Alcohol use disorder 07/06/2015   Prepyloric ulcer 06/22/2015   Elevated blood pressure reading with diagnosis of hypertension 04/14/2015   Seasonal allergies  04/14/2015   Past Medical History:  Diagnosis Date   Anemia    Anxiety    Arthritis    Asthma    Bloody stool    Chronic diastolic heart failure (HCC)    Dental decay    Dysrhythmia    Atrial flutter   Gastritis, Helicobacter pylori 06/22/2015   Hypertension    Paroxysmal atrial fibrillation (HCC)    Pneumonia 06/21/2015   Prepyloric ulcer 06/22/2015   Seasonal allergies    Sleep apnea    no CPAP    Past Surgical History:  Procedure Laterality Date   BARTHOLIN CYST MARSUPIALIZATION Left 07/22/1995   BIOPSY  11/04/2021   Procedure: BIOPSY;  Surgeon: Kerin Salen, MD;  Location: [facility];  Service: Gastroenterology;;   BUBBLE STUDY  10/31/2021   Procedure: BUBBLE STUDY;  Surgeon: Tessa Lerner, DO;  Location: [facility];  Service: Cardiovascular;;   CARDIAC SURGERY N/A 07/21/1961   Repair of PFO   CARDIOVERSION N/A 10/31/2021   Procedure: CARDIOVERSION;  Surgeon: Tessa Lerner, DO;  Location: [facility];  Service: Cardiovascular;  Laterality: N/A;   COLONOSCOPY N/A 07/21/2000   Performed secondary to mother's diagnosis of colon CA at age 28   COLONOSCOPY WITH PROPOFOL N/A 11/04/2021   Procedure: COLONOSCOPY WITH PROPOFOL;  Surgeon: Kerin Salen, MD;  Location: [facility];  Service: Gastroenterology;  Laterality: N/A;   ESOPHAGOGASTRODUODENOSCOPY (EGD) WITH PROPOFOL N/A 06/22/2015   Procedure: ESOPHAGOGASTRODUODENOSCOPY (EGD) WITH PROPOFOL;  Surgeon: Willis Modena, MD;  Location: [facility] END[facility]COPY;  Service: Endoscopy;  Laterality: N/A;   ESOPHAGOGASTRODUODENOSCOPY (EGD) WITH PROPOFOL N/A 11/04/2021   Procedure: ESOPHAGOGASTRODUODENOSCOPY (EGD) WITH PROPOFOL;  Surgeon: Kerin Salen, MD;  Location: [facility];  Service: Gastroenterology;  Laterality: N/A;   FRACTURE SURGERY Left 2005   ORIF tibia   NASAL SINUS SURGERY Bilateral 07/21/1982   OVARIAN CYST REMOVAL Left 07/21/1988   POLYPECTOMY  11/04/2021   Procedure: POLYPECTOMY;  Surgeon: Kerin Salen, MD;  Location: [facility]  ENDOSCOPY;  Service: Gastroenterology;;   TEE WITHOUT CARDIOVERSION N/A 10/31/2021   Procedure: TRANSESOPHAGEAL ECHOCARDIOGRAM (TEE);  Surgeon: Tessa Lerner, DO;  Location: [facility] END[facility];  Service: Cardiovascular;  Laterality: N/A;   TUBAL LIGATION     UMBILICAL HERNIA REPAIR N/A 07/22/2003    Current Outpatient Medications  Medication Sig Dispense Refill Last Dose   acetaminophen (TYLENOL) 325 MG tablet Take 2 tablets (650 mg total) by mouth every 6 (six) hours as needed. 36 tablet 0    albuterol (VENTOLIN HFA) 108 (90 Base) MCG/ACT inhaler Inhale 2 puffs into the lungs every 6 (six) hours as needed for wheezing or shortness of breath. 8.5 g 11    apixaban (ELIQUIS) 5 MG TABS tablet Take 1 tablet (5 mg total) by mouth 2 (two) times daily. 180 tablet 1    atorvastatin (LIPITOR) 20 MG tablet TAKE 1 TABLET (20 MG TOTAL) BY MOUTH AT BEDTIME FOR CHOLESTEROL 90 tablet 1    Blood Pressure Monitoring (B-D ASSURE BPM/AUTO ARM CUFF) MISC Use to test blood pressure daily. Dx:I10 1 each 0    BREO ELLIPTA 200-25 MCG/ACT AEPB Inhale 1 puff into the lungs daily. 60 each 3    cholecalciferol (VITAMIN D3) 25 MCG (1000 UNIT) tablet Take 1,000 Units by mouth in the morning.      metoprolol succinate (TOPROL-XL) 25 MG 24 hr tablet TAKE 1 TABLET BY MOUTH DAILY. TAKE WITH OR IMMEDIATELY FOLLOWING A MEAL. (Patient taking differently: Take 25 mg by mouth daily after lunch. TAKE WITH OR IMMEDIATELY FOLLOWING A MEAL.) 90 tablet 1    OXYGEN Inhale 2 L/min into the lungs as needed (wheezing/respiratory issues.).      sacubitril-valsartan (ENTRESTO) 24-26 MG Take 1 tablet by mouth 2 (two) times daily. 180 tablet 1    traZODone (DESYREL) 50 MG tablet Take 1 tablet (50 mg total) by mouth at bedtime. 30 tablet 1    No current facility-administered medications for this visit.   Allergies  Allergen Reactions   Sodium Ferric Gluconate [Ferrous Gluconate] Other (See Comments)    Patient had near syncope with abdominal pain and  diaphoresis about an hour after IV ferric gluconate although it was while she was on commode having bowel movements   Imdur [Isosorbide Nitrate]     headache   Tetracyclines & Related Itching    Social History   Tobacco Use   Smoking status: Never   Smokeless tobacco: Never  Substance Use Topics   Alcohol use: Not Currently    Comment: previously 3-4 beers per day, Stopped drinking in 2023    Family History  Problem Relation Age of Onset   Colon cancer Mother 64   Hypertension Mother    Cancer Father 63       liver cancer   Hypertension Father    Cerebral aneurysm Sister    Goiter Sister    Breast cancer Neg Hx      Review of Systems  Musculoskeletal:  Positive for arthralgias and gait problem.  All other systems reviewed and are negative.   Objective:  Physical Exam Constitutional:      Appearance: Normal appearance.  HENT:     Head: Normocephalic and atraumatic.     Nose: Nose normal.  Mouth/Throat:     Mouth: Mucous membranes are moist.     Pharynx: Oropharynx is clear.  Eyes:     Conjunctiva/sclera: Conjunctivae normal.  Cardiovascular:     Rate and Rhythm: Normal rate and regular rhythm.     Pulses: Normal pulses.     Heart sounds: Normal heart sounds.  Pulmonary:     Effort: Pulmonary effort is normal.     Breath sounds: Normal breath sounds.  Abdominal:     General: Abdomen is flat.     Palpations: Abdomen is soft.  Genitourinary:    Comments: deferred Musculoskeletal:     Cervical back: Normal range of motion and neck supple.     Comments: Examination of the right hip reveals no skin wounds or lesions. Severely restricted range of motion. Severe pain with internal and external rotation of her hip reported in her lateral hip. Mild trochanteric tenderness to palpation. Hip abductor strength 4/5.   Neurovascularly intact distally.   Skin:    General: Skin is warm and dry.     Capillary Refill: Capillary refill takes less than 2 seconds.   Neurological:     General: No focal deficit present.     Mental Status: She is alert and oriented to person, place, and time.  Psychiatric:        Mood and Affect: Mood normal.        Behavior: Behavior normal.        Thought Content: Thought content normal.        Judgment: Judgment normal.     Vital signs in last 24 hours: @VSRANGES @  Labs:   Estimated body mass index is 34.19 kg/m as calculated from the following:   Height as of 04/10/23: 5\' 3"  (1.6 m).   Weight as of 04/10/23: 87.5 kg.   Imaging Review Plain radiographs demonstrate severe degenerative joint disease of the right hip(s). The bone quality appears to be adequate for age and reported activity level.      Assessment/Plan:  End stage arthritis, right hip(s)  The patient history, physical examination, clinical judgement of the provider and imaging studies are consistent with end stage degenerative joint disease of the right hip(s) and total hip arthroplasty is deemed medically necessary. The treatment options including medical management, injection therapy, arthroscopy and arthroplasty were discussed at length. The risks and benefits of total hip arthroplasty were presented and reviewed. The risks due to aseptic loosening, infection, stiffness, dislocation/subluxation,  thromboembolic complications and other imponderables were discussed.  The patient acknowledged the explanation, agreed to proceed with the plan and consent was signed. Patient is being admitted for inpatient treatment for surgery, pain control, PT, OT, prophylactic antibiotics, VTE prophylaxis, progressive ambulation and ADL's and discharge planning.The patient is planning to be discharged home with HEP after an overnight stay.    Therapy Plans: HEP.  Disposition: Home with daughter Planned DVT Prophylaxis: Eliquis 5mg  BID DME needed: walker.  PCP: Cleared. Cardiology: Cleared. Stop farxiga and eliquis 3 days prior to surgery.  TXA:  IV Allergies:  - Iron - stomach pains.  - Tetracycline - itching.  - Imdur - headache.  Anesthesia Concerns: None.  BMI: 33.8 Last HgbA1c: Not diabetic.  Other: - OSA doesn't use CPAP.  - Afib, chronic eliquis use.  - Asthma, COPD - Nasal swab positive.  - Hydrocodone, zofran, methocarbamol.  - NO NSAIDs.  - 04/10/23: Hgb 12.6, K+ 4.2, Cr. 0.44.      Patient's anticipated LOS is less than 2 midnights, meeting  these requirements: - Younger than 40 - Lives within 1 hour of care - Has a competent adult at home to recover with post-op recover - NO history of  - Chronic pain requiring opiods  - Diabetes  - Coronary Artery Disease  - Heart failure  - Heart attack  - Stroke  - DVT/VTE  - Cardiac arrhythmia  - Respiratory Failure/COPD  - Renal failure  - Anemia  - Advanced Liver disease

## 2023-04-20 ENCOUNTER — Encounter: Payer: Self-pay | Admitting: Nurse Practitioner

## 2023-04-20 ENCOUNTER — Other Ambulatory Visit: Payer: Self-pay | Admitting: *Deleted

## 2023-04-20 ENCOUNTER — Ambulatory Visit (INDEPENDENT_AMBULATORY_CARE_PROVIDER_SITE_OTHER): Payer: 59 | Admitting: Nurse Practitioner

## 2023-04-20 VITALS — BP 128/80 | HR 50 | Temp 97.0°F | Ht 63.0 in | Wt 197.0 lb

## 2023-04-20 DIAGNOSIS — M1611 Unilateral primary osteoarthritis, right hip: Secondary | ICD-10-CM

## 2023-04-20 DIAGNOSIS — F418 Other specified anxiety disorders: Secondary | ICD-10-CM

## 2023-04-20 MED ORDER — TRAZODONE HCL 50 MG PO TABS
50.0000 mg | ORAL_TABLET | Freq: Every day | ORAL | 1 refills | Status: DC
Start: 2023-04-20 — End: 2023-06-22

## 2023-04-20 NOTE — Progress Notes (Signed)
Careteam: Patient Care Team: [name] Seller, NP as PCP - General (Geriatric Medicine)  PLACE OF SERVICE:  [facility]  Advanced Directive information Does Patient Have a Medical Advance Directive?: Yes, Type of Advance Directive: Out of facility DNR (pink MOST or yellow form), Pre-existing out of facility DNR order (yellow form or pink MOST form): Pink MOST form placed in chart (order not valid for inpatient use), Does patient want to make changes to medical advance directive?: No - Patient declined  Allergies  Allergen Reactions   Sodium Ferric Gluconate [Ferrous Gluconate] Other (See Comments)    Patient had near syncope with abdominal pain and diaphoresis about an hour after IV ferric gluconate although it was while she was on commode having bowel movements   Imdur [Isosorbide Nitrate]     headache   Tetracyclines & Related Itching    Chief Complaint  Patient presents with   Pre-op Exam    Pre-operation exam for pending right  hip surgery.  Patient c/o itching this morning.      HPI: Patient is a 62 y.o. female presents for medical clearance for right total hip arthoplasty. She has already been seen for this in July and scheduled for for procedure in 2 days. Cardiologist has already signed off. Was told to follow back up today.  Has had quite a few surgeries in the past, recovered well, no issues with anesthesia.  Has not had any issues with her breathing. Denies chest pain, shortness of breath, palpitations. Stopped taking Eliquis on Saturday before surgery per ortho. States she has been cleared by cardiology. Planned surgery for 10/2.  Review of Systems:  Review of Systems  Constitutional:  Negative for fever and malaise/fatigue.  Respiratory:  Negative for cough and shortness of breath.   Cardiovascular:  Negative for chest pain, palpitations and leg swelling.  Gastrointestinal:  Negative for constipation, diarrhea, nausea and vomiting.  Genitourinary:  Negative for  dysuria, frequency and urgency.  Neurological:  Negative for dizziness, weakness and headaches.  Psychiatric/Behavioral:  Negative for depression. The patient is not nervous/anxious.     Past Medical History:  Diagnosis Date   Anemia    Anxiety    Arthritis    Asthma    Bloody stool    Chronic diastolic heart failure (HCC)    Dental decay    Dysrhythmia    Atrial flutter   Gastritis, Helicobacter pylori 06/22/2015   Hypertension    Paroxysmal atrial fibrillation (HCC)    Pneumonia 06/21/2015   Prepyloric ulcer 06/22/2015   Seasonal allergies    Sleep apnea    no CPAP   Past Surgical History:  Procedure Laterality Date   BARTHOLIN CYST MARSUPIALIZATION Left 07/22/1995   BIOPSY  11/04/2021   Procedure: BIOPSY;  Surgeon: Kerin Salen, MD;  Location: [facility];  Service: Gastroenterology;;   BUBBLE STUDY  10/31/2021   Procedure: BUBBLE STUDY;  Surgeon: Tessa Lerner, DO;  Location: [facility];  Service: Cardiovascular;;   CARDIAC SURGERY N/A 07/21/1961   Repair of PFO   CARDIOVERSION N/A 10/31/2021   Procedure: CARDIOVERSION;  Surgeon: Tessa Lerner, DO;  Location: [facility];  Service: Cardiovascular;  Laterality: N/A;   COLONOSCOPY N/A 07/21/2000   Performed secondary to mother's diagnosis of colon CA at age 50   COLONOSCOPY WITH PROPOFOL N/A 11/04/2021   Procedure: COLONOSCOPY WITH PROPOFOL;  Surgeon: Kerin Salen, MD;  Location: [facility];  Service: Gastroenterology;  Laterality: N/A;   ESOPHAGOGASTRODUODENOSCOPY (EGD) WITH PROPOFOL N/A 06/22/2015  Procedure: ESOPHAGOGASTRODUODENOSCOPY (EGD) WITH PROPOFOL;  Surgeon: Willis Modena, MD;  Location: [facility] ENDOSCOPY;  Service: Endoscopy;  Laterality: N/A;   ESOPHAGOGASTRODUODENOSCOPY (EGD) WITH PROPOFOL N/A 11/04/2021   Procedure: ESOPHAGOGASTRODUODENOSCOPY (EGD) WITH PROPOFOL;  Surgeon: Kerin Salen, MD;  Location: [facility];  Service: Gastroenterology;  Laterality: N/A;   FRACTURE SURGERY Left 2005   ORIF tibia    NASAL SINUS SURGERY Bilateral 07/21/1982   OVARIAN CYST REMOVAL Left 07/21/1988   POLYPECTOMY  11/04/2021   Procedure: POLYPECTOMY;  Surgeon: Kerin Salen, MD;  Location: [facility];  Service: Gastroenterology;;   TEE WITHOUT CARDIOVERSION N/A 10/31/2021   Procedure: TRANSESOPHAGEAL ECHOCARDIOGRAM (TEE);  Surgeon: Tessa Lerner, DO;  Location: [facility];  Service: Cardiovascular;  Laterality: N/A;   TUBAL LIGATION     UMBILICAL HERNIA REPAIR N/A 07/22/2003   Social History:   reports that she has never smoked. She has never used smokeless tobacco. She reports that she does not currently use alcohol. She reports that she does not use drugs.  Family History  Problem Relation Age of Onset   Colon cancer Mother 57   Hypertension Mother    Cancer Father 55       liver cancer   Hypertension Father    Cerebral aneurysm Sister    Goiter Sister    Breast cancer Neg Hx     Medications: Patient's Medications  New Prescriptions   No medications on file  Previous Medications   ACETAMINOPHEN (TYLENOL) 325 MG TABLET    Take 2 tablets (650 mg total) by mouth every 6 (six) hours as needed.   ALBUTEROL (VENTOLIN HFA) 108 (90 BASE) MCG/ACT INHALER    Inhale 2 puffs into the lungs every 6 (six) hours as needed for wheezing or shortness of breath.   APIXABAN (ELIQUIS) 5 MG TABS TABLET    Take 1 tablet (5 mg total) by mouth 2 (two) times daily.   ATORVASTATIN (LIPITOR) 20 MG TABLET    TAKE 1 TABLET (20 MG TOTAL) BY MOUTH AT BEDTIME FOR CHOLESTEROL   BLOOD PRESSURE MONITORING (B-D ASSURE BPM/AUTO ARM CUFF) MISC    Use to test blood pressure daily. Dx:I10   BREO ELLIPTA 200-25 MCG/ACT AEPB    Inhale 1 puff into the lungs daily.   CHOLECALCIFEROL (VITAMIN D3) 25 MCG (1000 UNIT) TABLET    Take 1,000 Units by mouth in the morning.   METOPROLOL SUCCINATE (TOPROL-XL) 25 MG 24 HR TABLET    TAKE 1 TABLET BY MOUTH DAILY. TAKE WITH OR IMMEDIATELY FOLLOWING A MEAL.   OXYGEN    Inhale 2 L/min into the lungs as  needed (wheezing/respiratory issues.).   SACUBITRIL-VALSARTAN (ENTRESTO) 24-26 MG    Take 1 tablet by mouth 2 (two) times daily.   TRAZODONE (DESYREL) 50 MG TABLET    Take 1 tablet (50 mg total) by mouth at bedtime.  Modified Medications   No medications on file  Discontinued Medications   No medications on file    Physical Exam:  Vitals:   04/20/23 1038  BP: 128/80  Pulse: (!) 50  Temp: (!) 97 F (36.1 C)  TempSrc: Temporal  SpO2: 98%  Weight: 89.4 kg  Height: 5\' 3"  (1.6 m)   Body mass index is 34.9 kg/m. Wt Readings from Last 3 Encounters:  04/20/23 89.4 kg  04/10/23 87.5 kg  02/25/23 90.1 kg    Physical Exam Constitutional:      Appearance: Normal appearance.  Cardiovascular:     Rate and Rhythm: Normal rate and regular rhythm.  Pulses: Normal pulses.     Heart sounds: Normal heart sounds.  Pulmonary:     Effort: Pulmonary effort is normal.     Breath sounds: Normal breath sounds.  Abdominal:     General: Bowel sounds are normal.     Palpations: Abdomen is soft.  Musculoskeletal:     Comments: Limited movement; right hip   Skin:    General: Skin is warm and dry.  Neurological:     General: No focal deficit present.     Mental Status: She is alert and oriented to person, place, and time. Mental status is at baseline.  Psychiatric:        Mood and Affect: Mood normal.        Behavior: Behavior normal.     Labs reviewed: Basic Metabolic Panel: Recent Labs    08/20/22 0000 10/04/22 1355 12/18/22 2102 02/16/23 1419 04/10/23 1039  NA 140   < > 137 140 139  K 3.5   < > 3.0* 3.8 4.2  CL 104   < > 102 103 104  CO2 28*   < > 23 27 26   GLUCOSE  --    < > 97 80 92  BUN 7   < > 11 6* 11  CREATININE 0.6   < > 0.79 0.42* 0.44  CALCIUM 9.2   < > 9.2 9.1 9.0  TSH 0.47  --   --   --   --    < > = values in this interval not displayed.   Liver Function Tests: Recent Labs    10/04/22 1355 10/05/22 1937 12/08/22 0000 02/16/23 1419 04/10/23 1039   AST 17 17  --  12 15  ALT 13 12  --  11 13  ALKPHOS 69 63  --   --  52  BILITOT 1.3* 1.3*  --  0.9 0.9  PROT 7.4 7.3  --  6.3 7.0  ALBUMIN 4.0 3.9 4.5  --  3.8   Recent Labs    10/04/22 1355 10/05/22 1937  LIPASE 23 22   No results for input(s): "AMMONIA" in the last 8760 hours. CBC: Recent Labs    10/04/22 1355 10/05/22 1937 12/18/22 2102 02/16/23 1419 04/10/23 1039  WBC 11.0* 9.9 6.4 5.0 5.4  NEUTROABS 8.2* 7.1  --  2,480  --   HGB 13.2 12.6 11.5* 11.5* 12.6  HCT 40.9 38.5 34.7* 35.6 39.0  MCV 88.7 88.1 85.7 88.6 91.8  PLT 213 204 227 203 208   Lipid Panel: Recent Labs    08/20/22 0000 02/16/23 1419  CHOL 136 168  HDL 54 52  LDLCALC 69 101*  TRIG 54 60  CHOLHDL  --  3.2   TSH: Recent Labs    08/20/22 0000  TSH 0.47   A1C: Lab Results  Component Value Date   HGBA1C 5.6 10/30/2021     Assessment/Plan  1. Osteoarthritis of right hip, unspecified osteoarthritis type -Planned surgery on 10/2- total right hip arthroplasty -Has received clearance from cardiology -Recent lab work reviewed, CBC and CMP WNL with no changes in baseline -Appears in her normal state of health; denies cardiac/respiratory complications -she will follow up in office in December for routine follow up, sooner if needed    Rollen Sox, FNP-MSN Student -I personally was present during the history, physical exam and medical decision-making activities of this service and have verified that the service and findings are accurately documented in the student's note  Abbey Chatters, NP

## 2023-04-20 NOTE — Telephone Encounter (Signed)
Patient called and stated that she had an appointment today and forgot to ask for a refill on her Trazodone.   Patient is requesting a refill.  Pended Rx and sent to [facility] for approval.

## 2023-04-22 ENCOUNTER — Ambulatory Visit (HOSPITAL_COMMUNITY): Payer: 59

## 2023-04-22 ENCOUNTER — Other Ambulatory Visit: Payer: Self-pay

## 2023-04-22 ENCOUNTER — Encounter (HOSPITAL_COMMUNITY): Payer: Self-pay | Admitting: Orthopedic Surgery

## 2023-04-22 ENCOUNTER — Encounter (HOSPITAL_COMMUNITY)
Admission: RE | Disposition: A | Discharge status: Home / Self Care | Payer: Self-pay | Source: Ambulatory Visit | Attending: Orthopedic Surgery

## 2023-04-22 ENCOUNTER — Ambulatory Visit (HOSPITAL_COMMUNITY)
Admission: RE | Admit: 2023-04-22 | Discharge: 2023-04-23 | Disposition: A | Discharge status: Home / Self Care | Payer: 59 | Source: Ambulatory Visit | Attending: Orthopedic Surgery | Admitting: Orthopedic Surgery

## 2023-04-22 ENCOUNTER — Ambulatory Visit (HOSPITAL_COMMUNITY): Payer: 59 | Admitting: Physician Assistant

## 2023-04-22 ENCOUNTER — Ambulatory Visit (HOSPITAL_BASED_OUTPATIENT_CLINIC_OR_DEPARTMENT_OTHER): Payer: 59 | Admitting: Anesthesiology

## 2023-04-22 DIAGNOSIS — I4891 Unspecified atrial fibrillation: Secondary | ICD-10-CM

## 2023-04-22 DIAGNOSIS — Z8711 Personal history of peptic ulcer disease: Secondary | ICD-10-CM | POA: Diagnosis not present

## 2023-04-22 DIAGNOSIS — M1611 Unilateral primary osteoarthritis, right hip: Secondary | ICD-10-CM | POA: Diagnosis present

## 2023-04-22 DIAGNOSIS — Z79899 Other long term (current) drug therapy: Secondary | ICD-10-CM | POA: Insufficient documentation

## 2023-04-22 DIAGNOSIS — J4489 Other specified chronic obstructive pulmonary disease: Secondary | ICD-10-CM | POA: Insufficient documentation

## 2023-04-22 DIAGNOSIS — I5032 Chronic diastolic (congestive) heart failure: Secondary | ICD-10-CM | POA: Diagnosis not present

## 2023-04-22 DIAGNOSIS — Z96641 Presence of right artificial hip joint: Secondary | ICD-10-CM | POA: Diagnosis present

## 2023-04-22 DIAGNOSIS — I080 Rheumatic disorders of both mitral and aortic valves: Secondary | ICD-10-CM | POA: Diagnosis not present

## 2023-04-22 DIAGNOSIS — I110 Hypertensive heart disease with heart failure: Secondary | ICD-10-CM | POA: Insufficient documentation

## 2023-04-22 DIAGNOSIS — I482 Chronic atrial fibrillation, unspecified: Secondary | ICD-10-CM | POA: Diagnosis not present

## 2023-04-22 HISTORY — PX: TOTAL HIP ARTHROPLASTY: SHX124

## 2023-04-22 LAB — ABO/RH: ABO/RH(D): O POS

## 2023-04-22 SURGERY — ARTHROPLASTY, HIP, TOTAL, ANTERIOR APPROACH
Anesthesia: General | Site: Hip | Laterality: Right

## 2023-04-22 MED ORDER — HYDROCODONE-ACETAMINOPHEN 7.5-325 MG PO TABS
1.0000 | ORAL_TABLET | ORAL | Status: DC | PRN
  Administered 2023-04-22: 1 via ORAL
  Administered 2023-04-22: 2 via ORAL
  Administered 2023-04-22 – 2023-04-23 (×4): 1 via ORAL
  Filled 2023-04-22 (×3): qty 1
  Filled 2023-04-22 (×2): qty 2

## 2023-04-22 MED ORDER — FLUTICASONE FUROATE-VILANTEROL 200-25 MCG/ACT IN AEPB
1.0000 | INHALATION_SPRAY | Freq: Every day | RESPIRATORY_TRACT | Status: DC
  Administered 2023-04-23: 1 via RESPIRATORY_TRACT
  Filled 2023-04-22: qty 28

## 2023-04-22 MED ORDER — METHOCARBAMOL 500 MG IVPB - SIMPLE MED: 500.0000 mg | Freq: Four times a day (QID) | INTRAVENOUS | Status: DC | PRN

## 2023-04-22 MED ORDER — KETOROLAC TROMETHAMINE 30 MG/ML IJ SOLN
INTRAMUSCULAR | Status: DC | PRN
  Administered 2023-04-22: 30 mg

## 2023-04-22 MED ORDER — WATER FOR IRRIGATION, STERILE IR SOLN
Status: DC | PRN
  Administered 2023-04-22: 2000 mL

## 2023-04-22 MED ORDER — APIXABAN 2.5 MG PO TABS
2.5000 mg | ORAL_TABLET | Freq: Two times a day (BID) | ORAL | Status: DC
  Administered 2023-04-23: 2.5 mg via ORAL
  Filled 2023-04-22: qty 1

## 2023-04-22 MED ORDER — HYDROCODONE-ACETAMINOPHEN 5-325 MG PO TABS
1.0000 | ORAL_TABLET | ORAL | Status: DC | PRN
  Administered 2023-04-23: 2 via ORAL
  Filled 2023-04-22: qty 2

## 2023-04-22 MED ORDER — POVIDONE-IODINE 10 % EX SWAB: 2.0000 | Freq: Once | CUTANEOUS | Status: DC

## 2023-04-22 MED ORDER — DOCUSATE SODIUM 100 MG PO CAPS
100.0000 mg | ORAL_CAPSULE | Freq: Two times a day (BID) | ORAL | Status: DC
  Administered 2023-04-22 – 2023-04-23 (×2): 100 mg via ORAL
  Filled 2023-04-22 (×2): qty 1

## 2023-04-22 MED ORDER — SODIUM CHLORIDE (PF) 0.9 % IJ SOLN
INTRAMUSCULAR | Status: AC
  Filled 2023-04-22: qty 50

## 2023-04-22 MED ORDER — PROPOFOL 10 MG/ML IV BOLUS
INTRAVENOUS | Status: DC | PRN
  Administered 2023-04-22: 120 mg via INTRAVENOUS

## 2023-04-22 MED ORDER — FENTANYL CITRATE (PF) 100 MCG/2ML IJ SOLN
INTRAMUSCULAR | Status: AC
  Filled 2023-04-22: qty 2

## 2023-04-22 MED ORDER — FENTANYL CITRATE (PF) 100 MCG/2ML IJ SOLN
INTRAMUSCULAR | Status: DC | PRN
  Administered 2023-04-22: 25 ug via INTRAVENOUS
  Administered 2023-04-22: 100 ug via INTRAVENOUS
  Administered 2023-04-22: 50 ug via INTRAVENOUS
  Administered 2023-04-22: 25 ug via INTRAVENOUS

## 2023-04-22 MED ORDER — MENTHOL 3 MG MT LOZG: 1.0000 | LOZENGE | OROMUCOSAL | Status: DC | PRN

## 2023-04-22 MED ORDER — ACETAMINOPHEN 500 MG PO TABS
500.0000 mg | ORAL_TABLET | Freq: Four times a day (QID) | ORAL | Status: AC
  Administered 2023-04-23: 500 mg via ORAL
  Filled 2023-04-22: qty 1

## 2023-04-22 MED ORDER — HYDROMORPHONE HCL 1 MG/ML IJ SOLN
0.2500 mg | INTRAMUSCULAR | Status: DC | PRN
  Administered 2023-04-22: .4 mg via INTRAVENOUS
  Administered 2023-04-22: 0.5 mg via INTRAVENOUS
  Administered 2023-04-22: .6 mg via INTRAVENOUS

## 2023-04-22 MED ORDER — METHOCARBAMOL 500 MG IVPB - SIMPLE MED
INTRAVENOUS | Status: AC
  Administered 2023-04-22: 500 mg via INTRAVENOUS
  Filled 2023-04-22: qty 55

## 2023-04-22 MED ORDER — METHOCARBAMOL 500 MG PO TABS
500.0000 mg | ORAL_TABLET | Freq: Four times a day (QID) | ORAL | Status: DC | PRN
  Administered 2023-04-22 – 2023-04-23 (×3): 500 mg via ORAL
  Filled 2023-04-22 (×3): qty 1

## 2023-04-22 MED ORDER — ACETAMINOPHEN 500 MG PO TABS
1000.0000 mg | ORAL_TABLET | Freq: Once | ORAL | Status: AC
  Administered 2023-04-22: 1000 mg via ORAL
  Filled 2023-04-22: qty 2

## 2023-04-22 MED ORDER — LACTATED RINGERS IV SOLN: INTRAVENOUS | Status: DC

## 2023-04-22 MED ORDER — HYDROMORPHONE HCL 2 MG/ML IJ SOLN
INTRAMUSCULAR | Status: AC
  Filled 2023-04-22: qty 1

## 2023-04-22 MED ORDER — TRANEXAMIC ACID-NACL 1000-0.7 MG/100ML-% IV SOLN
1000.0000 mg | INTRAVENOUS | Status: DC
  Filled 2023-04-22: qty 100

## 2023-04-22 MED ORDER — MUPIROCIN 2 % EX OINT
TOPICAL_OINTMENT | Freq: Two times a day (BID) | CUTANEOUS | Status: DC
  Filled 2023-04-22: qty 22

## 2023-04-22 MED ORDER — DROPERIDOL 2.5 MG/ML IJ SOLN: 0.6250 mg | Freq: Once | INTRAMUSCULAR | Status: DC | PRN

## 2023-04-22 MED ORDER — METOCLOPRAMIDE HCL 5 MG/ML IJ SOLN: 5.0000 mg | Freq: Three times a day (TID) | INTRAMUSCULAR | Status: DC | PRN

## 2023-04-22 MED ORDER — KETOROLAC TROMETHAMINE 30 MG/ML IJ SOLN
INTRAMUSCULAR | Status: AC
  Filled 2023-04-22: qty 1

## 2023-04-22 MED ORDER — LIDOCAINE HCL (CARDIAC) PF 100 MG/5ML IV SOSY
PREFILLED_SYRINGE | INTRAVENOUS | Status: DC | PRN
  Administered 2023-04-22: 80 mg via INTRAVENOUS

## 2023-04-22 MED ORDER — ONDANSETRON HCL 4 MG/2ML IJ SOLN
INTRAMUSCULAR | Status: DC | PRN
  Administered 2023-04-22: 4 mg via INTRAVENOUS

## 2023-04-22 MED ORDER — MIDAZOLAM HCL 5 MG/5ML IJ SOLN
INTRAMUSCULAR | Status: DC | PRN
  Administered 2023-04-22 (×2): 1 mg via INTRAVENOUS

## 2023-04-22 MED ORDER — POLYETHYLENE GLYCOL 3350 17 G PO PACK: 17.0000 g | PACK | Freq: Every day | ORAL | Status: DC | PRN

## 2023-04-22 MED ORDER — BUPIVACAINE-EPINEPHRINE 0.25% -1:200000 IJ SOLN
INTRAMUSCULAR | Status: DC | PRN
  Administered 2023-04-22: 30 mL

## 2023-04-22 MED ORDER — TRAZODONE HCL 50 MG PO TABS
50.0000 mg | ORAL_TABLET | Freq: Every day | ORAL | Status: DC
  Administered 2023-04-22: 50 mg via ORAL
  Filled 2023-04-22: qty 1

## 2023-04-22 MED ORDER — ONDANSETRON HCL 4 MG PO TABS: 4.0000 mg | ORAL_TABLET | Freq: Four times a day (QID) | ORAL | Status: DC | PRN

## 2023-04-22 MED ORDER — ROCURONIUM BROMIDE 100 MG/10ML IV SOLN
INTRAVENOUS | Status: DC | PRN
  Administered 2023-04-22: 70 mg via INTRAVENOUS

## 2023-04-22 MED ORDER — BISACODYL 10 MG RE SUPP: 10.0000 mg | Freq: Every day | RECTAL | Status: DC | PRN

## 2023-04-22 MED ORDER — SODIUM CHLORIDE 0.9 % IR SOLN
Status: DC | PRN
  Administered 2023-04-22: 3000 mL
  Administered 2023-04-22: 1000 mL

## 2023-04-22 MED ORDER — DEXAMETHASONE SODIUM PHOSPHATE 10 MG/ML IJ SOLN
INTRAMUSCULAR | Status: AC
  Filled 2023-04-22: qty 3

## 2023-04-22 MED ORDER — DIPHENHYDRAMINE HCL 12.5 MG/5ML PO ELIX: 12.5000 mg | ORAL_SOLUTION | ORAL | Status: DC | PRN

## 2023-04-22 MED ORDER — BUPIVACAINE-EPINEPHRINE 0.25% -1:200000 IJ SOLN
INTRAMUSCULAR | Status: AC
  Filled 2023-04-22: qty 1

## 2023-04-22 MED ORDER — ONDANSETRON HCL 4 MG/2ML IJ SOLN
INTRAMUSCULAR | Status: AC
  Filled 2023-04-22: qty 6

## 2023-04-22 MED ORDER — METOPROLOL SUCCINATE ER 25 MG PO TB24
25.0000 mg | ORAL_TABLET | Freq: Every day | ORAL | Status: DC
  Administered 2023-04-23: 25 mg via ORAL
  Filled 2023-04-22: qty 1

## 2023-04-22 MED ORDER — CEFAZOLIN SODIUM-DEXTROSE 2-4 GM/100ML-% IV SOLN
2.0000 g | Freq: Four times a day (QID) | INTRAVENOUS | Status: AC
  Administered 2023-04-22 (×2): 2 g via INTRAVENOUS
  Filled 2023-04-22 (×2): qty 100

## 2023-04-22 MED ORDER — SODIUM CHLORIDE (PF) 0.9 % IJ SOLN
INTRAMUSCULAR | Status: DC | PRN
  Administered 2023-04-22: 30 mL

## 2023-04-22 MED ORDER — HYDROMORPHONE HCL 1 MG/ML IJ SOLN
INTRAMUSCULAR | Status: AC
  Administered 2023-04-22: 0.5 mg via INTRAVENOUS
  Filled 2023-04-22: qty 1

## 2023-04-22 MED ORDER — ATORVASTATIN CALCIUM 20 MG PO TABS
20.0000 mg | ORAL_TABLET | Freq: Every day | ORAL | Status: DC
  Administered 2023-04-22: 20 mg via ORAL
  Filled 2023-04-22: qty 1

## 2023-04-22 MED ORDER — HYDROCODONE-ACETAMINOPHEN 7.5-325 MG PO TABS
ORAL_TABLET | ORAL | Status: AC
  Filled 2023-04-22: qty 1

## 2023-04-22 MED ORDER — CEFAZOLIN SODIUM-DEXTROSE 2-4 GM/100ML-% IV SOLN
2.0000 g | INTRAVENOUS | Status: AC
  Administered 2023-04-22: 2 g via INTRAVENOUS
  Filled 2023-04-22: qty 100

## 2023-04-22 MED ORDER — DEXAMETHASONE SODIUM PHOSPHATE 10 MG/ML IJ SOLN
INTRAMUSCULAR | Status: DC | PRN
Start: 2023-04-22 — End: 2023-04-22
  Administered 2023-04-22: 10 mg via INTRAVENOUS

## 2023-04-22 MED ORDER — ROCURONIUM BROMIDE 10 MG/ML (PF) SYRINGE
PREFILLED_SYRINGE | INTRAVENOUS | Status: AC
  Filled 2023-04-22: qty 10

## 2023-04-22 MED ORDER — MEPERIDINE HCL 50 MG/ML IJ SOLN
INTRAMUSCULAR | Status: AC
  Administered 2023-04-22: 12.5 mg via INTRAVENOUS
  Filled 2023-04-22: qty 1

## 2023-04-22 MED ORDER — ACETAMINOPHEN 500 MG PO TABS: 1000.0000 mg | ORAL_TABLET | Freq: Once | ORAL | Status: DC

## 2023-04-22 MED ORDER — PANTOPRAZOLE SODIUM 40 MG PO TBEC
40.0000 mg | DELAYED_RELEASE_TABLET | Freq: Every day | ORAL | Status: DC
  Administered 2023-04-22 – 2023-04-23 (×2): 40 mg via ORAL
  Filled 2023-04-22 (×2): qty 1

## 2023-04-22 MED ORDER — ALUM & MAG HYDROXIDE-SIMETH 200-200-20 MG/5ML PO SUSP: 30.0000 mL | ORAL | Status: DC | PRN

## 2023-04-22 MED ORDER — SODIUM CHLORIDE 0.9 % IV SOLN: INTRAVENOUS | Status: DC

## 2023-04-22 MED ORDER — ISOPROPYL ALCOHOL 70 % SOLN
Status: DC | PRN
  Administered 2023-04-22: 1 via TOPICAL

## 2023-04-22 MED ORDER — ONDANSETRON HCL 4 MG/2ML IJ SOLN: 4.0000 mg | Freq: Four times a day (QID) | INTRAMUSCULAR | Status: DC | PRN

## 2023-04-22 MED ORDER — MORPHINE SULFATE (PF) 2 MG/ML IV SOLN: 0.5000 mg | INTRAVENOUS | Status: DC | PRN

## 2023-04-22 MED ORDER — ORAL CARE MOUTH RINSE: 15.0000 mL | Freq: Once | OROMUCOSAL | Status: AC

## 2023-04-22 MED ORDER — SUGAMMADEX SODIUM 200 MG/2ML IV SOLN
INTRAVENOUS | Status: DC | PRN
Start: 2023-04-22 — End: 2023-04-22
  Administered 2023-04-22: 200 mg via INTRAVENOUS

## 2023-04-22 MED ORDER — LIDOCAINE HCL (PF) 2 % IJ SOLN
INTRAMUSCULAR | Status: AC
  Filled 2023-04-22: qty 15

## 2023-04-22 MED ORDER — PHENOL 1.4 % MT LIQD: 1.0000 | OROMUCOSAL | Status: DC | PRN

## 2023-04-22 MED ORDER — METOCLOPRAMIDE HCL 5 MG PO TABS: 5.0000 mg | ORAL_TABLET | Freq: Three times a day (TID) | ORAL | Status: DC | PRN

## 2023-04-22 MED ORDER — ACETAMINOPHEN 325 MG PO TABS: 325.0000 mg | ORAL_TABLET | Freq: Four times a day (QID) | ORAL | Status: DC | PRN

## 2023-04-22 MED ORDER — PROPOFOL 1000 MG/100ML IV EMUL
INTRAVENOUS | Status: AC
  Filled 2023-04-22: qty 300

## 2023-04-22 MED ORDER — SENNA 8.6 MG PO TABS
1.0000 | ORAL_TABLET | Freq: Two times a day (BID) | ORAL | Status: DC
  Administered 2023-04-22 – 2023-04-23 (×2): 8.6 mg via ORAL
  Filled 2023-04-22 (×2): qty 1

## 2023-04-22 MED ORDER — MEPERIDINE HCL 50 MG/ML IJ SOLN: 6.2500 mg | INTRAMUSCULAR | Status: DC | PRN

## 2023-04-22 MED ORDER — CHLORHEXIDINE GLUCONATE 0.12 % MT SOLN
15.0000 mL | Freq: Once | OROMUCOSAL | Status: AC
  Administered 2023-04-22: 15 mL via OROMUCOSAL

## 2023-04-22 MED ORDER — ALBUTEROL SULFATE (2.5 MG/3ML) 0.083% IN NEBU: 3.0000 mL | INHALATION_SOLUTION | Freq: Four times a day (QID) | RESPIRATORY_TRACT | Status: DC | PRN

## 2023-04-22 MED ORDER — MIDAZOLAM HCL 2 MG/2ML IJ SOLN
INTRAMUSCULAR | Status: AC
  Filled 2023-04-22: qty 2

## 2023-04-22 MED ORDER — TRANEXAMIC ACID-NACL 1000-0.7 MG/100ML-% IV SOLN
INTRAVENOUS | Status: DC | PRN
Start: 2023-04-22 — End: 2023-04-22
  Administered 2023-04-22: 1000 mg via INTRAVENOUS

## 2023-04-22 SURGICAL SUPPLY — 54 items
ADH SKN CLS APL DERMABOND .7 (GAUZE/BANDAGES/DRESSINGS) ×1
APL PRP STRL LF DISP 70% ISPRP (MISCELLANEOUS) ×1
BAG COUNTER SPONGE SURGICOUNT (BAG) IMPLANT
BAG SPEC THK2 15X12 ZIP CLS (MISCELLANEOUS)
BAG SPNG CNTER NS LX DISP (BAG)
BAG ZIPLOCK 12X15 (MISCELLANEOUS) IMPLANT
CHLORAPREP W/TINT 26 (MISCELLANEOUS) ×2 IMPLANT
COVER PERINEAL POST (MISCELLANEOUS) ×2 IMPLANT
COVER SURGICAL LIGHT HANDLE (MISCELLANEOUS) ×2 IMPLANT
DERMABOND ADVANCED .7 DNX12 (GAUZE/BANDAGES/DRESSINGS) ×4 IMPLANT
DRAPE IMP U-DRAPE 54X76 (DRAPES) ×2 IMPLANT
DRAPE SHEET LG 3/4 BI-LAMINATE (DRAPES) ×6 IMPLANT
DRAPE STERI IOBAN 125X83 (DRAPES) ×2 IMPLANT
DRAPE U-SHAPE 47X51 STRL (DRAPES) ×2 IMPLANT
DRSG AQUACEL AG ADV 3.5X10 (GAUZE/BANDAGES/DRESSINGS) ×2 IMPLANT
ELECT REM PT RETURN 15FT ADLT (MISCELLANEOUS) ×2 IMPLANT
GAUZE SPONGE 4X4 12PLY STRL (GAUZE/BANDAGES/DRESSINGS) ×2 IMPLANT
GLOVE BIO SURGEON STRL SZ7 (GLOVE) ×2 IMPLANT
GLOVE BIO SURGEON STRL SZ8.5 (GLOVE) ×4 IMPLANT
GLOVE BIOGEL PI IND STRL 7.5 (GLOVE) ×2 IMPLANT
GLOVE BIOGEL PI IND STRL 8.5 (GLOVE) ×2 IMPLANT
GOWN SPEC L3 XXLG W/TWL (GOWN DISPOSABLE) ×2 IMPLANT
GOWN STRL REUS W/ TWL XL LVL3 (GOWN DISPOSABLE) ×2 IMPLANT
GOWN STRL REUS W/TWL XL LVL3 (GOWN DISPOSABLE) ×1
HANDPIECE INTERPULSE COAX TIP (DISPOSABLE) ×1
HEAD CERAMIC BIOLOX 36 T1 STD (Head) IMPLANT
HOLDER FOLEY CATH W/STRAP (MISCELLANEOUS) ×2 IMPLANT
HOOD PEEL AWAY T7 (MISCELLANEOUS) ×6 IMPLANT
KIT TURNOVER KIT A (KITS) IMPLANT
LINER ACETAB ~~LOC~~ D 36 (Liner) IMPLANT
MANIFOLD NEPTUNE II (INSTRUMENTS) ×2 IMPLANT
MARKER SKIN DUAL TIP RULER LAB (MISCELLANEOUS) ×2 IMPLANT
NDL SAFETY ECLIP 18X1.5 (MISCELLANEOUS) ×2 IMPLANT
NDL SPNL 18GX3.5 QUINCKE PK (NEEDLE) ×2 IMPLANT
NEEDLE SPNL 18GX3.5 QUINCKE PK (NEEDLE) ×1
PACK ANTERIOR HIP CUSTOM (KITS) ×2 IMPLANT
SAW OSC TIP CART 19.5X105X1.3 (SAW) ×2 IMPLANT
SEALER BIPOLAR AQUA 6.0 (INSTRUMENTS) ×2 IMPLANT
SET HNDPC FAN SPRY TIP SCT (DISPOSABLE) ×2 IMPLANT
SHELL ACET G7 3H 50 SZD (Shell) IMPLANT
SOLUTION PRONTOSAN WOUND 350ML (IRRIGATION / IRRIGATOR) ×2 IMPLANT
SPIKE FLUID TRANSFER (MISCELLANEOUS) ×2 IMPLANT
STEM FEM CMTLS HO 39 133D (Stem) IMPLANT
SUT MNCRL AB 3-0 PS2 18 (SUTURE) ×2 IMPLANT
SUT MON AB 2-0 CT1 36 (SUTURE) ×2 IMPLANT
SUT STRATAFIX PDO 1 14 VIOLET (SUTURE) ×1
SUT STRATFX PDO 1 14 VIOLET (SUTURE) ×1
SUT VIC AB 2-0 CT1 27 (SUTURE)
SUT VIC AB 2-0 CT1 TAPERPNT 27 (SUTURE) IMPLANT
SUTURE STRATFX PDO 1 14 VIOLET (SUTURE) ×2 IMPLANT
SYR 3ML LL SCALE MARK (SYRINGE) ×2 IMPLANT
TRAY FOLEY MTR SLVR 16FR STAT (SET/KITS/TRAYS/PACK) IMPLANT
TUBE SUCTION HIGH CAP CLEAR NV (SUCTIONS) ×2 IMPLANT
WATER STERILE IRR 1000ML POUR (IV SOLUTION) ×2 IMPLANT

## 2023-04-22 NOTE — Op Note (Signed)
OPERATIVE REPORT  SURGEON: Samson Frederic, MD   ASSISTANT: Clint Bolder, PA-C.  PREOPERATIVE DIAGNOSIS: Right hip arthritis.   POSTOPERATIVE DIAGNOSIS: Right hip arthritis.   PROCEDURE: Right total hip arthroplasty, anterior approach.   IMPLANTS: Biomet Taperloc Complete Microplasty stem, size 7 x 99 mm, high offset. Biomet G7 OsseoTi Cup, size 50 mm. Biomet Vivacit-E liner, size 36 mm, D, neutral. Biomet Biolox ceramic head ball, size 36 + 0 mm.  ANESTHESIA:  MAC and Spinal  ESTIMATED BLOOD LOSS:-250 mL    ANTIBIOTICS: 2g Ancef.  DRAINS: None.  COMPLICATIONS: None.   CONDITION: PACU - hemodynamically stable.   BRIEF CLINICAL NOTE: [name] [name] is a 62 y.o. female with a long-standing history of Right hip arthritis. After failing conservative management, the patient was indicated for total hip arthroplasty. The risks, benefits, and alternatives to the procedure were explained, and the patient elected to proceed.  PROCEDURE IN DETAIL: Surgical site was marked by myself in the pre-op holding area. Once inside the operating room, spinal anesthesia was obtained, and a foley catheter was inserted. The patient was then positioned on the Hana table.  All bony prominences were well padded.  The hip was prepped and draped in the normal sterile surgical fashion.  A time-out was called verifying side and site of surgery. The patient received IV antibiotics within 60 minutes of beginning the procedure.   Bikini incision was made, and superficial dissection was performed lateral to the ASIS. The direct anterior approach to the hip was performed through the Hueter interval.  Lateral femoral circumflex vessels were treated with the Auqumantys. The anterior capsule was exposed and an inverted T capsulotomy was made. The femoral neck cut was made to the level of the templated cut.  A corkscrew was placed into the head and the head was removed.  The femoral head was found to have eburnated  bone. The head was passed to the back table and was measured. Pubofemoral ligament was released off of the calcar, taking care to stay on bone. Superior capsule was released from the greater trochanter, taking care to stay lateral to the posterior border of the femoral neck in order to preserve the short external rotators.   Acetabular exposure was achieved, and the pulvinar and labrum were excised. Sequential reaming of the acetabulum was then performed up to a size 49 mm reamer. A 50 mm cup was then opened and impacted into place at approximately 40 degrees of abduction and 20 degrees of anteversion. The final polyethylene liner was impacted into place and acetabular osteophytes were removed.    I then gained femoral exposure taking care to protect the abductors and greater trochanter.  This was performed using standard external rotation, extension, and adduction.  A cookie cutter was used to enter the femoral canal, and then the femoral canal finder was placed.  Sequential broaching was performed up to a size 7.  Calcar planer was used on the femoral neck remnant.  I placed a high offset neck and a trial head ball.  The hip was reduced.  Leg lengths and offset were checked fluoroscopically.  The hip was dislocated and trial components were removed.  The final implants were placed, and the hip was reduced.  Fluoroscopy was used to confirm component position and leg lengths.  At 90 degrees of external rotation and full extension, the hip was stable to an anterior directed force.   The wound was copiously irrigated with Prontosan solution and normal saline using pulse lavage.  Marcaine solution was injected into the periarticular soft tissue.  The wound was closed in layers using #1 Vicryl and V-Loc for the fascia, 2-0 Vicryl for the subcutaneous fat, 2-0 Monocryl for the deep dermal layer, 3-0 running Monocryl subcuticular stitch, and Dermabond for the skin.  Once the glue was fully dried, an Aquacell Ag  dressing was applied.  The patient was transported to the recovery room in stable condition.  Sponge, needle, and instrument counts were correct at the end of the case x2.  The patient tolerated the procedure well and there were no known complications.  Please note that a surgical assistant was a medical necessity for this procedure to perform it in a safe and expeditious manner. Assistant was necessary to provide appropriate retraction of vital neurovascular structures, to prevent femoral fracture, and to allow for anatomic placement of the prosthesis.

## 2023-04-22 NOTE — Transfer of Care (Signed)
Immediate Anesthesia Transfer of Care Note  Patient: [name]  Procedure(s) Performed: TOTAL HIP ARTHROPLASTY ANTERIOR APPROACH (Right: Hip)  Patient Location: PACU  Anesthesia Type:General  Level of Consciousness: awake, alert , oriented, and patient cooperative  Airway & Oxygen Therapy: Patient Spontanous Breathing and Patient connected to face mask oxygen  Post-op Assessment: Report given to RN, Post -op Vital signs reviewed and stable, and Patient moving all extremities X 4  Post vital signs: Reviewed and stable  Last Vitals:  Vitals Value Taken Time  BP    Temp    Pulse    Resp 16 04/22/23 1109  SpO2    Vitals shown include unfiled device data.  Last Pain:  Vitals:   04/22/23 0717  TempSrc: Oral  PainSc:          Complications: No notable events documented.

## 2023-04-22 NOTE — Evaluation (Signed)
Physical Therapy Evaluation Patient Details Name: [name] [name] MRN: [id number] DOB: June 14, 1961 Today's Date: 04/22/2023  History of Present Illness  Pt is 62 yo female admitted on 04/22/23 for R anterior THA.  Pt with hx including but not limited to sleep apnea, afib, HTN, OA, COPD  Clinical Impression  Pt is s/p THA resulting in the deficits listed below (see PT Problem List). At baseline, pt lives alone and is independent.  She has arranged for family to stay with her at d/c. Today, pt reports increased pain but was premedicated, agreeable to therapy, and tolerated therapy.  She required min A for transfers and ambulated 6' with RW and min A, chair follow.  Pt expected to progress with therapy.  Pt will benefit from acute skilled PT to increase their independence and safety with mobility to facilitate discharge.          If plan is discharge home, recommend the following: A lot of help with walking and/or transfers;A lot of help with bathing/dressing/bathroom;Assistance with cooking/housework;Help with stairs or ramp for entrance   Can travel by private vehicle        Equipment Recommendations Rolling walker (2 wheels)  Recommendations for Other Services       Functional Status Assessment Patient has had a recent decline in their functional status and demonstrates the ability to make significant improvements in function in a reasonable and predictable amount of time.     Precautions / Restrictions Precautions Precautions: Fall Restrictions Weight Bearing Restrictions: Yes RLE Weight Bearing: Weight bearing as tolerated      Mobility  Bed Mobility Overal bed mobility: Needs Assistance Bed Mobility: Supine to Sit     Supine to sit: Min assist     General bed mobility comments: cues for scooting forward    Transfers Overall transfer level: Needs assistance Equipment used: Rolling walker (2 wheels) Transfers: Sit to/from Stand Sit to Stand: Min assist, From elevated  surface           General transfer comment: cues for hand placement and R LE management; stood from elevated bed then returned to chair with pillows to elevate    Ambulation/Gait Ambulation/Gait assistance: Contact guard assist Gait Distance (Feet): 6 Feet Assistive device: Rolling walker (2 wheels) Gait Pattern/deviations: Step-to pattern, Decreased stride length, Wide base of support Gait velocity: decreased     General Gait Details: Cues for sequencing and RW use; small steps with low foot clearance  Stairs            Wheelchair Mobility     Tilt Bed    Modified Rankin (Stroke Patients Only)       Balance Overall balance assessment: Needs assistance Sitting-balance support: No upper extremity supported Sitting balance-Leahy Scale: Good     Standing balance support: Bilateral upper extremity supported, Reliant on assistive device for balance Standing balance-Leahy Scale: Poor Standing balance comment: RW ad CGA                             Pertinent Vitals/Pain Pain Assessment Pain Assessment: 0-10 Pain Score: 6  Pain Location: R hip Pain Descriptors / Indicators: Burning, Discomfort, Grimacing Pain Intervention(s): Limited activity within patient's tolerance, Monitored during session, Premedicated before session, Repositioned, Ice applied    Home Living Family/patient expects to be discharged to:: Private residence Living Arrangements: Alone Available Help at Discharge: Family;Available 24 hours/day (daughter and sister) Type of Home: Apartment Home Access: Stairs to enter Entrance  Stairs-Rails: Left Entrance Stairs-Number of Steps: 2   Home Layout: One level Home Equipment: Grab bars - tub/shower;Shower seat Additional Comments: Reports ordered BSC or toilet riser; Has home O2 as needed    Prior Function Prior Level of Function : Independent/Modified Independent;Driving             Mobility Comments: Ambulating without AD  mostly short community distances; typically uses motorized cart at store ADLs Comments: Independent with adls and iadls     Extremity/Trunk Assessment   Upper Extremity Assessment Upper Extremity Assessment: Overall WFL for tasks assessed    Lower Extremity Assessment Lower Extremity Assessment: LLE deficits/detail;RLE deficits/detail RLE Deficits / Details: Expected post op changes; ROM: wfl; MMT: ankle 5/5, knee ext 3/5, hip 1/5 LLE Deficits / Details: ROM WFL ; MMT 5/5    Cervical / Trunk Assessment Cervical / Trunk Assessment: Normal  Communication      Cognition Arousal: Alert Behavior During Therapy: WFL for tasks assessed/performed Overall Cognitive Status: Within Functional Limits for tasks assessed                                          General Comments General comments (skin integrity, edema, etc.): Pt was on 2 L O2 with sats 99%.  Tried RA and sats maintain 98%.  Pt reports using home O2 as needed. She is on continuous pulse ox, left on RA, notified RN    Exercises     Assessment/Plan    PT Assessment Patient needs continued PT services  PT Problem List Decreased strength;Cardiopulmonary status limiting activity;Pain;Decreased range of motion;Decreased activity tolerance;Decreased balance;Decreased mobility;Decreased knowledge of precautions;Decreased knowledge of use of DME       PT Treatment Interventions DME instruction;Therapeutic exercise;Gait training;Balance training;Stair training;Functional mobility training;Therapeutic activities;Patient/family education;Modalities    PT Goals (Current goals can be found in the Care Plan section)  Acute Rehab PT Goals Patient Stated Goal: return home; walk better PT Goal Formulation: With patient Time For Goal Achievement: 05/06/23 Potential to Achieve Goals: Good    Frequency 7X/week     Co-evaluation               AM-PAC PT "6 Clicks" Mobility  Outcome Measure Help needed turning  from your back to your side while in a flat bed without using bedrails?: A Little Help needed moving from lying on your back to sitting on the side of a flat bed without using bedrails?: A Little Help needed moving to and from a bed to a chair (including a wheelchair)?: A Little Help needed standing up from a chair using your arms (e.g., wheelchair or bedside chair)?: A Little Help needed to walk in hospital room?: Total Help needed climbing 3-5 steps with a railing? : Total 6 Click Score: 14    End of Session Equipment Utilized During Treatment: Gait belt Activity Tolerance: Patient tolerated treatment well Patient left: with call bell/phone within reach;with chair alarm set;in chair Nurse Communication: Mobility status PT Visit Diagnosis: Other abnormalities of gait and mobility (R26.89);Muscle weakness (generalized) (M62.81)    Time: 1610-9604 PT Time Calculation (min) (ACUTE ONLY): 23 min   Charges:   PT Evaluation $PT Eval Low Complexity: 1 Low PT Treatments $Gait Training: 8-22 mins PT General Charges $$ ACUTE PT VISIT: 1 Visit         Anise Salvo, PT Acute Rehab [facility] [phone]   [email or web address]  H Lam Bjorklund 04/22/2023, 4:35 PM

## 2023-04-22 NOTE — Plan of Care (Signed)
  Problem: Education: Goal: Knowledge of the prescribed therapeutic regimen will improve Outcome: Progressing   Problem: Pain Management: Goal: Pain level will decrease with appropriate interventions Outcome: Progressing   Problem: Activity: Goal: Risk for activity intolerance will decrease Outcome: Progressing   Problem: Nutrition: Goal: Adequate nutrition will be maintained Outcome: Progressing   

## 2023-04-22 NOTE — Interval H&P Note (Signed)
History and Physical Interval Note:  04/22/2023 7:27 AM  [name] [name]  has presented today for surgery, with the diagnosis of Right hip osteoarthriits.  The various methods of treatment have been discussed with the patient and family. After consideration of risks, benefits and other options for treatment, the patient has consented to  Procedure(s) with comments: TOTAL HIP ARTHROPLASTY ANTERIOR APPROACH (Right) - 140 as a surgical intervention.  The patient's history has been reviewed, patient examined, no change in status, stable for surgery.  I have reviewed the patient's chart and labs.  Questions were answered to the patient's satisfaction.    The risks, benefits, and alternatives were discussed with the patient. There are risks associated with the surgery including, but not limited to, problems with anesthesia (death), infection, instability (giving out of the joint), dislocation, differences in leg length/angulation/rotation, fracture of bones, loosening or failure of implants, hematoma (blood accumulation) which may require surgical drainage, blood clots, pulmonary embolism, nerve injury (foot drop and lateral thigh numbness), and blood vessel injury. The patient understands these risks and elects to proceed.    [name] [name]

## 2023-04-22 NOTE — Discharge Instructions (Addendum)
 Dr. Brian Swinteck Joint Replacement Specialist [facility] [address] [place], [place] [address] [phone]   TOTAL HIP REPLACEMENT POSTOPERATIVE DIRECTIONS    Hip Rehabilitation, Guidelines Following Surgery   WEIGHT BEARING Weight bearing as tolerated with assist device (walker, cane, etc) as directed, use it as long as suggested by your surgeon or therapist, typically at least 4-6 weeks.  The results of a hip operation are greatly improved after range of motion and muscle strengthening exercises. Follow all safety measures which are given to protect your hip. If any of these exercises cause increased pain or swelling in your joint, decrease the amount until you are comfortable again. Then slowly increase the exercises. Call your caregiver if you have problems or questions.   HOME CARE INSTRUCTIONS  Most of the following instructions are designed to prevent the dislocation of your new hip.  Remove items at home which could result in a fall. This includes throw rugs or furniture in walking pathways.  Continue medications as instructed at time of discharge. You may have some home medications which will be placed on hold until you complete the course of blood thinner medication. You may start showering once you are discharged home. Do not remove your dressing. Do not put on socks or shoes without following the instructions of your caregivers.   Sit on chairs with arms. Use the chair arms to help push yourself up when arising.  Arrange for the use of a toilet seat elevator so you are not sitting low.  Walk with walker as instructed.  You may resume a sexual relationship in one month or when given the OK by your caregiver.  Use walker as long as suggested by your caregivers.  You may put full weight on your legs and walk as much as is comfortable. Avoid periods of inactivity such as sitting longer than an hour when not asleep. This helps prevent blood  clots.  You may return to work once you are cleared by your surgeon.  Do not drive a car for 6 weeks or until released by your surgeon.  Do not drive while taking narcotics.  Wear elastic stockings for two weeks following surgery during the day but you may remove then at night.  Make sure you keep all of your appointments after your operation with all of your doctors and caregivers. You should call the office at the above phone number and make an appointment for approximately two weeks after the date of your surgery. Please pick up a stool softener and laxative for home use as long as you are requiring pain medications. ICE to the affected hip every three hours for 30 minutes at a time and then as needed for pain and swelling. Continue to use ice on the hip for pain and swelling from surgery. You may notice swelling that will progress down to the foot and ankle.  This is normal after surgery.  Elevate the leg when you are not up walking on it.   It is important for you to complete the blood thinner medication as prescribed by your doctor. Continue to use the breathing machine which will help keep your temperature down.  It is common for your temperature to cycle up and down following surgery, especially at night when you are not up moving around and exerting yourself.  The breathing machine keeps your lungs expanded and your temperature down.  RANGE OF MOTION AND STRENGTHENING EXERCISES  These exercises are designed to help you   keep full movement of your hip joint. Follow your caregiver's or physical therapist's instructions. Perform all exercises about fifteen times, three times per day or as directed. Exercise both hips, even if you have had only one joint replacement. These exercises can be done on a training (exercise) mat, on the floor, on a table or on a bed. Use whatever works the best and is most comfortable for you. Use music or television while you are exercising so that the exercises are a  pleasant break in your day. This will make your life better with the exercises acting as a break in routine you can look forward to.  ?Lying on your back, slowly slide your foot toward your buttocks, raising your knee up off the floor. Then slowly slide your foot back down until your leg is straight again.  ?Lying on your back spread your legs as far apart as you can without causing discomfort.  ?Lying on your side, raise your upper leg and foot straight up from the floor as far as is comfortable. Slowly lower the leg and repeat.  ?Lying on your back, tighten up the muscle in the front of your thigh (quadriceps muscles). You can do this by keeping your leg straight and trying to raise your heel off the floor. This helps strengthen the largest muscle supporting your knee.  ?Lying on your back, tighten up the muscles of your buttocks both with the legs straight and with the knee bent at a comfortable angle while keeping your heel on the floor.  ? ?SKILLED REHAB INSTRUCTIONS: ?If the patient is transferred to a skilled rehab facility following release from the hospital, a list of the current medications will be sent to the facility for the patient to continue.  When discharged from the skilled rehab facility, please have the facility set up the patient's Home Health Physical Therapy prior to being released. Also, the skilled facility will be responsible for providing the patient with their medications at time of release from the facility to include their pain medication and their blood thinner medication. If the patient is still at the rehab facility at time of the two week follow up appointment, the skilled rehab facility will also need to assist the patient in arranging follow up appointment in our office and any transportation needs. ? ?POST-OPERATIVE OPIOID TAPER INSTRUCTIONS: ?It is important to wean off of your opioid medication as soon as possible. If you do not need pain medication after your surgery it is ok  to stop day one. ?Opioids include: ?Codeine, Hydrocodone(Norco, Vicodin), Oxycodone(Percocet, oxycontin) and hydromorphone amongst others.  ?Long term and even short term use of opiods can cause: ?Increased pain response ?Dependence ?Constipation ?Depression ?Respiratory depression ?And more.  ?Withdrawal symptoms can include ?Flu like symptoms ?Nausea, vomiting ?And more ?Techniques to manage these symptoms ?Hydrate well ?Eat regular healthy meals ?Stay active ?Use relaxation techniques(deep breathing, meditating, yoga) ?Do Not substitute Alcohol to help with tapering ?If you have been on opioids for less than two weeks and do not have pain than it is ok to stop all together.  ?Plan to wean off of opioids ?This plan should start within one week post op of your joint replacement. ?Maintain the same interval or time between taking each dose and first decrease the dose.  ?Cut the total daily intake of opioids by one tablet each day ?Next start to increase the time between doses. ?The last dose that should be eliminated is the evening dose.  ? ? ?MAKE   SURE YOU:  Understand these instructions.  Will watch your condition.  Will get help right away if you are not doing well or get worse.  Pick up stool softner and laxative for home use following surgery while on pain medications. Do not remove your dressing. The dressing is waterproof--it is OK to take showers. Continue to use ice for pain and swelling after surgery. Do not use any lotions or creams on the incision until instructed by your surgeon. Total Hip Protocol.   Information on my medicine - ELIQUIS (apixaban)  This medication education was reviewed with me or my healthcare representative as part of my discharge preparation.  The pharmacist that spoke with me during my hospital stay was:    Why was Eliquis prescribed for you? Eliquis was prescribed for you to reduce the risk of blood clots forming after orthopedic surgery.    What do You need  to know about Eliquis? Take your Eliquis TWICE DAILY - one tablet in the morning and one tablet in the evening with or without food.  It would be best to take the dose about the same time each day.  If you have difficulty swallowing the tablet whole please discuss with your pharmacist how to take the medication safely.  Take Eliquis exactly as prescribed by your doctor and DO NOT stop taking Eliquis without talking to the doctor who prescribed the medication.  Stopping without other medication to take the place of Eliquis may increase your risk of developing a clot.  After discharge, you should have regular check-up appointments with your healthcare provider that is prescribing your Eliquis.  What do you do if you miss a dose? If a dose of ELIQUIS is not taken at the scheduled time, take it as soon as possible on the same day and twice-daily administration should be resumed.  The dose should not be doubled to make up for a missed dose.  Do not take more than one tablet of ELIQUIS at the same time.  Important Safety Information A possible side effect of Eliquis is bleeding. You should call your healthcare provider right away if you experience any of the following: Bleeding from an injury or your nose that does not stop. Unusual colored urine (red or dark brown) or unusual colored stools (red or black). Unusual bruising for unknown reasons. A serious fall or if you hit your head (even if there is no bleeding).  Some medicines may interact with Eliquis and might increase your risk of bleeding or clotting while on Eliquis. To help avoid this, consult your healthcare provider or pharmacist prior to using any new prescription or non-prescription medications, including herbals, vitamins, non-steroidal anti-inflammatory drugs (NSAIDs) and supplements.  This website has more information on Eliquis (apixaban): http://www.eliquis.com/eliquis/home  

## 2023-04-22 NOTE — Anesthesia Procedure Notes (Signed)
Procedure Name: Intubation Date/Time: 04/22/2023 8:30 AM  Performed by: Chinita Pester, CRNAPre-anesthesia Checklist: Patient identified, Emergency Drugs available, Suction available and Patient being monitored Patient Re-evaluated:Patient Re-evaluated prior to induction Oxygen Delivery Method: Circle System Utilized Preoxygenation: Pre-oxygenation with 100% oxygen Induction Type: IV induction Ventilation: Mask ventilation without difficulty Grade View: Grade II Tube type: Oral Tube size: 7.0 mm Number of attempts: 1 Airway Equipment and Method: Stylet and Oral airway Placement Confirmation: ETT inserted through vocal cords under direct vision, positive ETCO2 and breath sounds checked- equal and bilateral Secured at: 21 cm Tube secured with: Tape Dental Injury: Teeth and Oropharynx as per pre-operative assessment

## 2023-04-22 NOTE — Plan of Care (Signed)
  Problem: Education: Goal: Knowledge of the prescribed therapeutic regimen will improve Outcome: Progressing   Problem: Activity: Goal: Ability to avoid complications of mobility impairment will improve Outcome: Progressing   Problem: Pain Management: Goal: Pain level will decrease with appropriate interventions Outcome: Progressing   

## 2023-04-22 NOTE — Anesthesia Postprocedure Evaluation (Signed)
Anesthesia Post Note  Patient: [name] [name]  Procedure(s) Performed: TOTAL HIP ARTHROPLASTY ANTERIOR APPROACH (Right: Hip)     Patient location during evaluation: PACU Anesthesia Type: General Level of consciousness: sedated and patient cooperative Pain management: pain level controlled Vital Signs Assessment: post-procedure vital signs reviewed and stable Respiratory status: spontaneous breathing Cardiovascular status: stable Anesthetic complications: no   No notable events documented.  Last Vitals:  Vitals:   04/22/23 1246 04/22/23 1451  BP: (!) 150/61 137/83  Pulse: (!) 51 63  Resp: 14 16  Temp: (!) 36.1 C 36.5 C  SpO2: 100% 100%    Last Pain:  Vitals:   04/22/23 1400  TempSrc:   PainSc: 5                  Lewie Loron

## 2023-04-23 ENCOUNTER — Encounter (HOSPITAL_COMMUNITY): Payer: Self-pay | Admitting: Orthopedic Surgery

## 2023-04-23 DIAGNOSIS — M1611 Unilateral primary osteoarthritis, right hip: Secondary | ICD-10-CM | POA: Diagnosis not present

## 2023-04-23 LAB — CBC
HCT: 28.9 % — ABNORMAL LOW (ref 36.0–46.0)
Hemoglobin: 9.3 g/dL — ABNORMAL LOW (ref 12.0–15.0)
MCH: 29.8 pg (ref 26.0–34.0)
MCHC: 32.2 g/dL (ref 30.0–36.0)
MCV: 92.6 fL (ref 80.0–100.0)
Platelets: 184 10*3/uL (ref 150–400)
RBC: 3.12 MIL/uL — ABNORMAL LOW (ref 3.87–5.11)
RDW: 12.8 % (ref 11.5–15.5)
WBC: 11.5 10*3/uL — ABNORMAL HIGH (ref 4.0–10.5)
nRBC: 0 % (ref 0.0–0.2)

## 2023-04-23 LAB — BASIC METABOLIC PANEL
Anion gap: 8 (ref 5–15)
BUN: 10 mg/dL (ref 8–23)
CO2: 24 mmol/L (ref 22–32)
Calcium: 8.2 mg/dL — ABNORMAL LOW (ref 8.9–10.3)
Chloride: 102 mmol/L (ref 98–111)
Creatinine, Ser: 0.56 mg/dL (ref 0.44–1.00)
GFR, Estimated: >60 mL/min (ref >60)
Glucose, Bld: 128 mg/dL — ABNORMAL HIGH (ref 70–99)
Potassium: 4 mmol/L (ref 3.5–5.1)
Sodium: 134 mmol/L — ABNORMAL LOW (ref 135–145)

## 2023-04-23 MED ORDER — SENNA 8.6 MG PO TABS: 2.0000 | ORAL_TABLET | Freq: Every day | ORAL | 0 refills | Status: AC

## 2023-04-23 MED ORDER — HYDROCODONE-ACETAMINOPHEN 5-325 MG PO TABS: 1.0000 | ORAL_TABLET | ORAL | 0 refills | Status: AC | PRN

## 2023-04-23 MED ORDER — MUPIROCIN 2 % EX OINT: 1.0000 | TOPICAL_OINTMENT | Freq: Two times a day (BID) | CUTANEOUS | 0 refills | Status: AC

## 2023-04-23 MED ORDER — POLYETHYLENE GLYCOL 3350 17 G PO PACK: 17.0000 g | PACK | Freq: Every day | ORAL | 0 refills | Status: AC | PRN

## 2023-04-23 MED ORDER — ONDANSETRON HCL 4 MG PO TABS: 4.0000 mg | ORAL_TABLET | Freq: Three times a day (TID) | ORAL | 0 refills | Status: DC | PRN

## 2023-04-23 MED ORDER — DOCUSATE SODIUM 100 MG PO CAPS: 100.0000 mg | ORAL_CAPSULE | Freq: Two times a day (BID) | ORAL | 0 refills | Status: AC

## 2023-04-23 MED ORDER — METHOCARBAMOL 500 MG PO TABS: 500.0000 mg | ORAL_TABLET | Freq: Four times a day (QID) | ORAL | 0 refills | Status: DC | PRN

## 2023-04-23 NOTE — Progress Notes (Signed)
Physical Therapy Treatment Patient Details Name: [name] [name] MRN: [id number] DOB: April 25, 1961 Today's Date: 04/23/2023   History of Present Illness Pt is 62 yo female admitted on 04/22/23 for R anterior THA.  Pt with hx including but not limited to sleep apnea, afib, HTN, OA, COPD    PT Comments  Pt is progressing well with mobility, she ambulated 79' with RW, no loss of balance. Stair training completed. Pt demonstrates good understanding of HEP. She is ready to DC home from a PT standpoint.      If plan is discharge home, recommend the following: A lot of help with walking and/or transfers;A lot of help with bathing/dressing/bathroom;Assistance with cooking/housework;Help with stairs or ramp for entrance   Can travel by private vehicle        Equipment Recommendations  Rolling walker (2 wheels)    Recommendations for Other Services       Precautions / Restrictions Precautions Precautions: Fall Restrictions Weight Bearing Restrictions: Yes RLE Weight Bearing: Weight bearing as tolerated     Mobility  Bed Mobility Overal bed mobility: Needs Assistance Bed Mobility: Supine to Sit     Supine to sit: HOB elevated, Used rails, Contact guard     General bed mobility comments: used gait belt as RLE lifter    Transfers Overall transfer level: Needs assistance Equipment used: Rolling walker (2 wheels) Transfers: Sit to/from Stand Sit to Stand: Contact guard assist, From elevated surface           General transfer comment: VCs hand placement    Ambulation/Gait Ambulation/Gait assistance: Contact guard assist Gait Distance (Feet): 80 Feet Assistive device: Rolling walker (2 wheels) Gait Pattern/deviations: Step-to pattern, Decreased stride length, Wide base of support Gait velocity: decreased     General Gait Details: steady, no loss of balance   Stairs Stairs: Yes Stairs assistance: Contact guard assist Stair Management: One rail Left, Step to pattern,  Forwards, With cane Number of Stairs: 2 General stair comments: VCs sequencing   Wheelchair Mobility     Tilt Bed    Modified Rankin (Stroke Patients Only)       Balance Overall balance assessment: Needs assistance Sitting-balance support: No upper extremity supported Sitting balance-Leahy Scale: Good     Standing balance support: Bilateral upper extremity supported, Reliant on assistive device for balance Standing balance-Leahy Scale: Poor Standing balance comment: RW ad CGA                            Cognition Arousal: Alert Behavior During Therapy: WFL for tasks assessed/performed Overall Cognitive Status: Within Functional Limits for tasks assessed                                          Exercises Total Joint Exercises Ankle Circles/Pumps: AROM, Both, 10 reps, Supine Quad Sets: AROM, Both, 5 reps, Supine Short Arc Quad: AROM, Right, 5 reps, Supine Heel Slides: AAROM, Right, 10 reps, Supine Hip ABduction/ADduction: AAROM, Right, 10 reps, Supine    General Comments        Pertinent Vitals/Pain Pain Assessment Pain Score: 6  Pain Location: R hip Pain Descriptors / Indicators: Burning, Discomfort, Grimacing Pain Intervention(s): Limited activity within patient's tolerance, Monitored during session, Premedicated before session, Ice applied    Home Living  Prior Function            PT Goals (current goals can now be found in the care plan section) Acute Rehab PT Goals Patient Stated Goal: return home; walk better PT Goal Formulation: With patient Time For Goal Achievement: 05/06/23 Potential to Achieve Goals: Good Progress towards PT goals: Progressing toward goals    Frequency    7X/week      PT Plan      Co-evaluation              AM-PAC PT "6 Clicks" Mobility   Outcome Measure  Help needed turning from your back to your side while in a flat bed without using  bedrails?: A Little Help needed moving from lying on your back to sitting on the side of a flat bed without using bedrails?: A Little Help needed moving to and from a bed to a chair (including a wheelchair)?: A Little Help needed standing up from a chair using your arms (e.g., wheelchair or bedside chair)?: A Little Help needed to walk in hospital room?: A Little Help needed climbing 3-5 steps with a railing? : A Lot 6 Click Score: 17    End of Session Equipment Utilized During Treatment: Gait belt Activity Tolerance: Patient tolerated treatment well Patient left: with call bell/phone within reach;with chair alarm set;in chair Nurse Communication: Mobility status PT Visit Diagnosis: Other abnormalities of gait and mobility (R26.89);Muscle weakness (generalized) (M62.81)     Time: 1610-9604 PT Time Calculation (min) (ACUTE ONLY): 47 min  Charges:    $Gait Training: 8-22 mins $Therapeutic Exercise: 8-22 mins $Therapeutic Activity: 8-22 mins PT General Charges $$ ACUTE PT VISIT: 1 Visit                    Tamala Ser PT 04/23/2023  Acute Rehabilitation Services  Office [phone]

## 2023-04-23 NOTE — Progress Notes (Signed)
Physical Therapy Treatment Patient Details Name: [name] [name] MRN: [id number] DOB: 07-27-1960 Today's Date: 04/23/2023   History of Present Illness Pt is 62 yo female admitted on 04/22/23 for R anterior THA.  Pt with hx including but not limited to sleep apnea, afib, HTN, OA, COPD    PT Comments  Pt ambulated 15' with RW, distance limited by onset of lightheadedness, assisted pt to recliner where BP was 130/51 in reclined position, HR 57, SpO2 100% on room air. Initiated THA HEP. Will see pt this afternoon for a second session. She is not yet ready to DC home from a PT standpoint.     If plan is discharge home, recommend the following: A lot of help with walking and/or transfers;A lot of help with bathing/dressing/bathroom;Assistance with cooking/housework;Help with stairs or ramp for entrance   Can travel by private vehicle        Equipment Recommendations  Rolling walker (2 wheels)    Recommendations for Other Services       Precautions / Restrictions Precautions Precautions: Fall Restrictions Weight Bearing Restrictions: Yes RLE Weight Bearing: Weight bearing as tolerated     Mobility  Bed Mobility Overal bed mobility: Needs Assistance Bed Mobility: Supine to Sit     Supine to sit: HOB elevated, Used rails, Contact guard     General bed mobility comments: used gait belt as RLE lifter    Transfers Overall transfer level: Needs assistance Equipment used: Rolling walker (2 wheels) Transfers: Sit to/from Stand Sit to Stand: Contact guard assist, From elevated surface           General transfer comment: VCs hand placement    Ambulation/Gait Ambulation/Gait assistance: Contact guard assist Gait Distance (Feet): 15 Feet Assistive device: Rolling walker (2 wheels) Gait Pattern/deviations: Step-to pattern, Decreased stride length, Wide base of support Gait velocity: decreased     General Gait Details: VCs sequencing, distance limited by onset of lightheadedness,  assisted pt to recliner where BP was 130/51 in reclined position, HR 57, SpO2 100% on room air   Stairs             Wheelchair Mobility     Tilt Bed    Modified Rankin (Stroke Patients Only)       Balance Overall balance assessment: Needs assistance Sitting-balance support: No upper extremity supported Sitting balance-Leahy Scale: Good     Standing balance support: Bilateral upper extremity supported, Reliant on assistive device for balance Standing balance-Leahy Scale: Poor Standing balance comment: RW ad CGA                            Cognition Arousal: Alert Behavior During Therapy: WFL for tasks assessed/performed Overall Cognitive Status: Within Functional Limits for tasks assessed                                          Exercises Total Joint Exercises Ankle Circles/Pumps: AROM, Both, 10 reps, Supine Heel Slides: AAROM, Right, 10 reps, Supine Hip ABduction/ADduction: AAROM, Right, 10 reps, Supine    General Comments        Pertinent Vitals/Pain Pain Assessment Pain Score: 6  Pain Location: R hip Pain Descriptors / Indicators: Burning, Discomfort, Grimacing Pain Intervention(s): Limited activity within patient's tolerance, Monitored during session, Premedicated before session, Ice applied    Home Living  Prior Function            PT Goals (current goals can now be found in the care plan section) Acute Rehab PT Goals Patient Stated Goal: return home; walk better PT Goal Formulation: With patient Time For Goal Achievement: 05/06/23 Potential to Achieve Goals: Good Progress towards PT goals: Progressing toward goals    Frequency    7X/week      PT Plan      Co-evaluation              AM-PAC PT "6 Clicks" Mobility   Outcome Measure  Help needed turning from your back to your side while in a flat bed without using bedrails?: A Little Help needed moving from lying  on your back to sitting on the side of a flat bed without using bedrails?: A Little Help needed moving to and from a bed to a chair (including a wheelchair)?: A Little Help needed standing up from a chair using your arms (e.g., wheelchair or bedside chair)?: A Little Help needed to walk in hospital room?: A Little Help needed climbing 3-5 steps with a railing? : A Lot 6 Click Score: 17    End of Session Equipment Utilized During Treatment: Gait belt Activity Tolerance: Patient tolerated treatment well Patient left: with call bell/phone within reach;with chair alarm set;in chair Nurse Communication: Mobility status;Other (comment) (pt lightheaded walking) PT Visit Diagnosis: Other abnormalities of gait and mobility (R26.89);Muscle weakness (generalized) (M62.81)     Time: 3220-2542 PT Time Calculation (min) (ACUTE ONLY): 26 min  Charges:    $Gait Training: 8-22 mins $Therapeutic Exercise: 8-22 mins PT General Charges $$ ACUTE PT VISIT: 1 Visit                     Tamala Ser PT 04/23/2023  Acute Rehabilitation Services  Office [phone]

## 2023-04-23 NOTE — Plan of Care (Signed)

## 2023-04-23 NOTE — TOC Transition Note (Signed)
Transition of Care [facility]) - CM/SW Discharge Note  Patient Details  Name: [name] [name] MRN: [id number] Date of Birth: 11-Oct-1960  [facility] CM/SW Contact:  Ewing Schlein, LCSW Phone Number: 04/23/2023, 11:32 AM  Clinical Narrative: Patient is expected to discharge home after working with PT. CSW met with patient to confirm discharge plan. Patient will go home with a home exercise program (HEP). Patient will need a rolling walker, which [employer] delivered to room. CSW discussed food insecurity SDOH with patient. Patient declined food pantry resources, but requested the contact information for [employer]. Patient aware there is likely still a waitlist. [employer] contact information added to AVS.  Final next level of care: Home/Self Care Barriers to Discharge: No Barriers Identified  Patient Goals and CMS Choice CMS Medicare.gov Compare Post Acute Care list provided to:: Patient Choice offered to / list presented to : Patient  Discharge Plan and Services Additional resources added to the After Visit Summary for        DME Arranged: Walker rolling DME Agency: [employer] Representative spoke with at DME Agency: Prearranged in orthopedist's office  Social Determinants of Health (SDOH) Interventions SDOH Screenings   Food Insecurity: Food Insecurity Present (04/16/2023)  Housing: High Risk (04/22/2023)  Transportation Needs: No Transportation Needs (04/16/2023)  Utilities: Not At Risk (01/10/2023)  Depression (PHQ2-9): Low Risk  (02/19/2023)  Financial Resource Strain: Low Risk  (04/16/2023)  Physical Activity: Insufficiently Active (04/16/2023)  Social Connections: Moderately Integrated (04/16/2023)  Stress: Stress Concern Present (04/16/2023)  Tobacco Use: Low Risk  (04/22/2023)   Readmission Risk Interventions     No data to display

## 2023-04-23 NOTE — Discharge Summary (Signed)
Physician Discharge Summary  Patient ID: [name] [name] MRN: [id number] DOB/AGE: 1961-03-24 62 y.o.  Admit date: 04/22/2023 Discharge date: 04/23/2023  Admission Diagnoses:  Primary osteoarthritis of right hip  Discharge Diagnoses:  Principal Problem:   Primary osteoarthritis of right hip Active Problems:   S/P total right hip arthroplasty   Past Medical History:  Diagnosis Date   Anemia    Anxiety    Arthritis    Asthma    Bloody stool    Chronic diastolic heart failure (HCC)    Dental decay    Dysrhythmia    Atrial flutter   Gastritis, Helicobacter pylori 06/22/2015   Hypertension    Paroxysmal atrial fibrillation (HCC)    Pneumonia 06/21/2015   Prepyloric ulcer 06/22/2015   Seasonal allergies    Sleep apnea    no CPAP    Surgeries: Procedure(s): TOTAL HIP ARTHROPLASTY ANTERIOR APPROACH on 04/22/2023   Consultants (if any):   Discharged Condition: Improved  Hospital Course: [name] [name] is an 62 y.o. female who was admitted 04/22/2023 with a diagnosis of Primary osteoarthritis of right hip and went to the operating room on 04/22/2023 and underwent the above named procedures.    She was given perioperative antibiotics:  Anti-infectives (From admission, onward)    Start     Dose/Rate Route Frequency Ordered Stop   04/22/23 1500  ceFAZolin (ANCEF) IVPB 2g/100 mL premix        2 g 200 mL/hr over 30 Minutes Intravenous Every 6 hours 04/22/23 1251 04/22/23 2059   04/22/23 0700  ceFAZolin (ANCEF) IVPB 2g/100 mL premix        2 g 200 mL/hr over 30 Minutes Intravenous On call to O.R. 04/22/23 4098 04/22/23 0845       She was given sequential compression devices, early ambulation, and Eliquis for DVT prophylaxis.  POD#1 She ambulated 15 and 80 feet with PT. Pain controlled with medication.   She benefited maximally from the hospital stay and there were no complications.    Recent vital signs:  Vitals:   04/23/23 0953 04/23/23 1427  BP: (!) 130/51 121/65   Pulse: (!) 57 (!) 59  Resp:  16  Temp:  (!) 97.2 F (36.2 C)  SpO2: 100% 99%    Recent laboratory studies:  Lab Results  Component Value Date   HGB 9.3 (L) 04/23/2023   HGB 12.6 04/10/2023   HGB 11.5 (L) 02/16/2023   Lab Results  Component Value Date   WBC 11.5 (H) 04/23/2023   PLT 184 04/23/2023   Lab Results  Component Value Date   INR 1.0 05/14/2021   Lab Results  Component Value Date   NA 134 (L) 04/23/2023   K 4.0 04/23/2023   CL 102 04/23/2023   CO2 24 04/23/2023   BUN 10 04/23/2023   CREATININE 0.56 04/23/2023   GLUCOSE 128 (H) 04/23/2023     Allergies as of 04/23/2023       Reactions   Sodium Ferric Gluconate [ferrous Gluconate] Other (See Comments)   Patient had near syncope with abdominal pain and diaphoresis about an hour after IV ferric gluconate although it was while she was on commode having bowel movements   Imdur [isosorbide Nitrate]    headache   Tetracyclines & Related Itching        Medication List     STOP taking these medications    acetaminophen 325 MG tablet Commonly known as: Tylenol       TAKE these medications  albuterol 108 (90 Base) MCG/ACT inhaler Commonly known as: VENTOLIN HFA Inhale 2 puffs into the lungs every 6 (six) hours as needed for wheezing or shortness of breath.   apixaban 5 MG Tabs tablet Commonly known as: Eliquis Take 1 tablet (5 mg total) by mouth 2 (two) times daily.   atorvastatin 20 MG tablet Commonly known as: LIPITOR TAKE 1 TABLET (20 MG TOTAL) BY MOUTH AT BEDTIME FOR CHOLESTEROL   B-D ASSURE BPM/AUTO ARM CUFF Misc Use to test blood pressure daily. Dx:I10   Breo Ellipta 200-25 MCG/ACT Aepb Generic drug: fluticasone furoate-vilanterol Inhale 1 puff into the lungs daily.   cholecalciferol 25 MCG (1000 UNIT) tablet Commonly known as: VITAMIN D3 Take 1,000 Units by mouth in the morning.   docusate sodium 100 MG capsule Commonly known as: Colace Take 1 capsule (100 mg total) by mouth 2  (two) times daily.   Entresto 24-26 MG Generic drug: sacubitril-valsartan Take 1 tablet by mouth 2 (two) times daily.   HYDROcodone-acetaminophen 5-325 MG tablet Commonly known as: NORCO/VICODIN Take 1 tablet by mouth every 4 (four) hours as needed for up to 7 days for moderate pain or severe pain.   methocarbamol 500 MG tablet Commonly known as: ROBAXIN Take 1 tablet (500 mg total) by mouth every 6 (six) hours as needed.   metoprolol succinate 25 MG 24 hr tablet Commonly known as: TOPROL-XL TAKE 1 TABLET BY MOUTH DAILY. TAKE WITH OR IMMEDIATELY FOLLOWING A MEAL.   mupirocin ointment 2 % Commonly known as: BACTROBAN Place 1 Application into the nose 2 (two) times daily for 5 days.   ondansetron 4 MG tablet Commonly known as: Zofran Take 1 tablet (4 mg total) by mouth every 8 (eight) hours as needed for nausea or vomiting.   OXYGEN Inhale 2 L/min into the lungs as needed (wheezing/respiratory issues.).   polyethylene glycol 17 g packet Commonly known as: MiraLax Take 17 g by mouth daily as needed for mild constipation or moderate constipation.   senna 8.6 MG Tabs tablet Commonly known as: SENOKOT Take 2 tablets (17.2 mg total) by mouth at bedtime for 15 days.   traZODone 50 MG tablet Commonly known as: DESYREL Take 1 tablet (50 mg total) by mouth at bedtime.               Discharge Care Instructions  (From admission, onward)           Start     Ordered   04/23/23 0000  Weight bearing as tolerated        04/23/23 0713   04/23/23 0000  Change dressing       Comments: Do not remove your dressing.   04/23/23 0713              WEIGHT BEARING   Weight bearing as tolerated with assist device (walker, cane, etc) as directed, use it as long as suggested by your surgeon or therapist, typically at least 4-6 weeks.   EXERCISES  Results after joint replacement surgery are often greatly improved when you follow the exercise, range of motion and muscle  strengthening exercises prescribed by your doctor. Safety measures are also important to protect the joint from further injury. Any time any of these exercises cause you to have increased pain or swelling, decrease what you are doing until you are comfortable again and then slowly increase them. If you have problems or questions, call your caregiver or physical therapist for advice.   Rehabilitation is important following a joint  replacement. After just a few days of immobilization, the muscles of the leg can become weakened and shrink (atrophy).  These exercises are designed to build up the tone and strength of the thigh and leg muscles and to improve motion. Often times heat used for twenty to thirty minutes before working out will loosen up your tissues and help with improving the range of motion but do not use heat for the first two weeks following surgery (sometimes heat can increase post-operative swelling).   These exercises can be done on a training (exercise) mat, on the floor, on a table or on a bed. Use whatever works the best and is most comfortable for you.    Use music or television while you are exercising so that the exercises are a pleasant break in your day. This will make your life better with the exercises acting as a break in your routine that you can look forward to.   Perform all exercises about fifteen times, three times per day or as directed.  You should exercise both the operative leg and the other leg as well.  Exercises include:   Quad Sets - Tighten up the muscle on the front of the thigh (Quad) and hold for 5-10 seconds.   Straight Leg Raises - With your knee straight (if you were given a brace, keep it on), lift the leg to 60 degrees, hold for 3 seconds, and slowly lower the leg.  Perform this exercise against resistance later as your leg gets stronger.  Leg Slides: Lying on your back, slowly slide your foot toward your buttocks, bending your knee up off the floor (only go  as far as is comfortable). Then slowly slide your foot back down until your leg is flat on the floor again.  Angel Wings: Lying on your back spread your legs to the side as far apart as you can without causing discomfort.  Hamstring Strength:  Lying on your back, push your heel against the floor with your leg straight by tightening up the muscles of your buttocks.  Repeat, but this time bend your knee to a comfortable angle, and push your heel against the floor.  You may put a pillow under the heel to make it more comfortable if necessary.   A rehabilitation program following joint replacement surgery can speed recovery and prevent re-injury in the future due to weakened muscles. Contact your doctor or a physical therapist for more information on knee rehabilitation.    CONSTIPATION  Constipation is defined medically as fewer than three stools per week and severe constipation as less than one stool per week.  Even if you have a regular bowel pattern at home, your normal regimen is likely to be disrupted due to multiple reasons following surgery.  Combination of anesthesia, postoperative narcotics, change in appetite and fluid intake all can affect your bowels.   YOU MUST use at least one of the following options; they are listed in order of increasing strength to get the job done.  They are all available over the counter, and you may need to use some, POSSIBLY even all of these options:    Drink plenty of fluids (prune juice may be helpful) and high fiber foods Colace 100 mg by mouth twice a day  Senokot for constipation as directed and as needed Dulcolax (bisacodyl), take with full glass of water  Miralax (polyethylene glycol) once or twice a day as needed.  If you have tried all these things and are unable  to have a bowel movement in the first 3-4 days after surgery call either your surgeon or your primary doctor.    If you experience loose stools or diarrhea, hold the medications until you  stool forms back up.  If your symptoms do not get better within 1 week or if they get worse, check with your doctor.  If you experience "the worst abdominal pain ever" or develop nausea or vomiting, please contact the office immediately for further recommendations for treatment.   ITCHING:  If you experience itching with your medications, try taking only a single pain pill, or even half a pain pill at a time.  You can also use Benadryl over the counter for itching or also to help with sleep.   TED HOSE STOCKINGS:  Use stockings on both legs until for at least 2 weeks or as directed by physician office. They may be removed at night for sleeping.  MEDICATIONS:  See your medication summary on the "After Visit Summary" that nursing will review with you.  You may have some home medications which will be placed on hold until you complete the course of blood thinner medication.  It is important for you to complete the blood thinner medication as prescribed.  PRECAUTIONS:  If you experience chest pain or shortness of breath - call 911 immediately for transfer to the hospital emergency department.   If you develop a fever greater that 101 F, purulent drainage from wound, increased redness or drainage from wound, foul odor from the wound/dressing, or calf pain - CONTACT YOUR SURGEON.                                                   FOLLOW-UP APPOINTMENTS:  If you do not already have a post-op appointment, please call the office for an appointment to be seen by your surgeon.  Guidelines for how soon to be seen are listed in your "After Visit Summary", but are typically between 1-4 weeks after surgery.  OTHER INSTRUCTIONS:   Knee Replacement:  Do not place pillow under knee, focus on keeping the knee straight while resting. CPM instructions: 0-90 degrees, 2 hours in the morning, 2 hours in the afternoon, and 2 hours in the evening. Place foam block, curve side up under heel at all times except when in CPM or  when walking.  DO NOT modify, tear, cut, or change the foam block in any way.   MAKE SURE YOU:  Understand these instructions.  Get help right away if you are not doing well or get worse.    Thank you for letting us be a part of your medical care team.  It is a privilege we respect greatly.  We hope these instructions will help you stay on track for a fast and full recovery!   Diagnostic Studies: DG Pelvis Portable  Result Date: 04/22/2023 CLINICAL DATA:  Hip arthroplasty EXAM: PORTABLE PELVIS 1-2 VIEWS COMPARISON:  04/22/2023, CT 10/04/2022 FINDINGS: Status post right hip replacement with intact hardware and normal alignment. No fracture. Gas in the soft tissues consistent with recent surgery IMPRESSION: Status post right hip replacement. Electronically Signed   By: Jasmine Pang M.D.   On: 04/22/2023 15:31   DG HIP UNILAT WITH PELVIS 1V RIGHT  Result Date: 04/22/2023 CLINICAL DATA:  Total right hip arthroplasty. Intraoperative fluoroscopy. EXAM: DG HIP (  WITH OR WITHOUT PELVIS) 1V RIGHT COMPARISON:  None Available. FINDINGS: Images were performed intraoperatively without the presence of a radiologist. Interval total right hip arthroplasty. No hardware complication is seen. Total fluoroscopy images: 2 Total fluoroscopy time: 18 seconds Total dose: Radiation Exposure Index (as provided by the fluoroscopic device): 2.05 mGy air Kerma Please see intraoperative findings for further detail. IMPRESSION: Intraoperative fluoroscopy for total right hip arthroplasty. Electronically Signed   By: Neita Garnet M.D.   On: 04/22/2023 11:17   DG C-Arm 1-60 Min-No Report  Result Date: 04/22/2023 Fluoroscopy was utilized by the requesting physician.  No radiographic interpretation.    Disposition: Discharge disposition: 01-Home or Self Care       Discharge Instructions     Call MD / Call 911   Complete by: As directed    If you experience chest pain or shortness of breath, CALL 911 and be transported  to the hospital emergency room.  If you develope a fever above 101 F, pus (white drainage) or increased drainage or redness at the wound, or calf pain, call your surgeon's office.   Change dressing   Complete by: As directed    Do not remove your dressing.   Constipation Prevention   Complete by: As directed    Drink plenty of fluids.  Prune juice may be helpful.  You may use a stool softener, such as Colace (over the counter) 100 mg twice a day.  Use MiraLax (over the counter) for constipation as needed.   Diet - low sodium heart healthy   Complete by: As directed    Discharge instructions   Complete by: As directed    Elevate toes above nose. Use cryotherapy as needed for pain and swelling.   Driving restrictions   Complete by: As directed    No driving for 6 weeks   Increase activity slowly as tolerated   Complete by: As directed    Lifting restrictions   Complete by: As directed    No lifting for 6 weeks   Post-operative opioid taper instructions:   Complete by: As directed    POST-OPERATIVE OPIOID TAPER INSTRUCTIONS: It is important to wean off of your opioid medication as soon as possible. If you do not need pain medication after your surgery it is ok to stop day one. Opioids include: Codeine, Hydrocodone(Norco, Vicodin), Oxycodone(Percocet, oxycontin) and hydromorphone amongst others.  Long term and even short term use of opiods can cause: Increased pain response Dependence Constipation Depression Respiratory depression And more.  Withdrawal symptoms can include Flu like symptoms Nausea, vomiting And more Techniques to manage these symptoms Hydrate well Eat regular healthy meals Stay active Use relaxation techniques(deep breathing, meditating, yoga) Do Not substitute Alcohol to help with tapering If you have been on opioids for less than two weeks and do not have pain than it is ok to stop all together.  Plan to wean off of opioids This plan should start within one  week post op of your joint replacement. Maintain the same interval or time between taking each dose and first decrease the dose.  Cut the total daily intake of opioids by one tablet each day Next start to increase the time between doses. The last dose that should be eliminated is the evening dose.      TED hose   Complete by: As directed    Use stockings (TED hose) for 2 weeks on both leg(s).  You may remove them at night for sleeping.   Weight  bearing as tolerated   Complete by: As directed         Follow-up Information     Clois Dupes, PA-C. Schedule an appointment as soon as possible for a visit in 2 week(s).   Specialty: Orthopedic Surgery Why: For suture removal, For wound re-check Contact information: [address] [place] [address] [phone]         Meals on Wheels. Call.   Contact information: Contact the Senior Line to be screened [phone]- [place]                 Signed: Clois Dupes 04/23/2023, 3:51 PM

## 2023-04-23 NOTE — Progress Notes (Signed)
    Subjective:  Patient reports pain as mild to moderate.  Denies N/V/CP/SOB/ABD pain. She reports her pain is controlled with the medication. She denies any tingling or numbness in LE bilaterally.   Objective:   VITALS:   Vitals:   04/22/23 1747 04/22/23 2115 04/23/23 0123 04/23/23 0535  BP: (!) 152/73 129/71 115/72 (!) 105/52  Pulse: (!) 53 (!) 54 (!) 57 62  Resp: 16 16 17 17   Temp: 98.7 F (37.1 C) 98.7 F (37.1 C) 98 F (36.7 C) 98.7 F (37.1 C)  TempSrc:  Oral Oral Oral  SpO2: 100% 98% 100% 100%  Weight:      Height:        Patient lying in bed. NAD.  Neurologically intact ABD soft Neurovascular intact Sensation intact distally Intact pulses distally Dorsiflexion/Plantar flexion intact Incision: dressing C/D/I No cellulitis present Compartment soft   Lab Results  Component Value Date   WBC 11.5 (H) 04/23/2023   HGB 9.3 (L) 04/23/2023   HCT 28.9 (L) 04/23/2023   MCV 92.6 04/23/2023   PLT 184 04/23/2023   BMET    Component Value Date/Time   NA 134 (L) 04/23/2023 0304   NA 142 12/08/2022 0000   K 4.0 04/23/2023 0304   CL 102 04/23/2023 0304   CO2 24 04/23/2023 0304   GLUCOSE 128 (H) 04/23/2023 0304   BUN 10 04/23/2023 0304   BUN 11 12/08/2022 0000   CREATININE 0.56 04/23/2023 0304   CREATININE 0.42 (L) 02/16/2023 1419   CALCIUM 8.2 (L) 04/23/2023 0304   EGFR 111 02/16/2023 1419   GFRNONAA >60 04/23/2023 0304     Assessment/Plan: 1 Day Post-Op   Principal Problem:   Primary osteoarthritis of right hip Active Problems:   S/P total right hip arthroplasty  ABLA. Hemoglobin 9.3. Continue to monitor.   WBAT with walker DVT ppx:  Eliquis , SCDs, TEDS PO pain control PT/OT: Ambulated 6 feet with PT yesterday. Continue PT today.  Dispo:  - D/c home with HEP once cleared with PT.    Clois Dupes, PA-C 04/23/2023, 7:08 AM   [name]  [place] [address], [place], [place] [address] Phone:  [phone] [email or web address] Facebook  [employer]

## 2023-05-04 ENCOUNTER — Telehealth: Payer: 59 | Admitting: *Deleted

## 2023-05-04 NOTE — Telephone Encounter (Signed)
Letter complete.

## 2023-05-04 NOTE — Telephone Encounter (Signed)
Patient notified and agreed.  Requested it to be faxed to  Kaylyn Layer Fax: [phone]  Faxed.

## 2023-05-04 NOTE — Telephone Encounter (Signed)
Patient called and stated that she wants to start Online Courses through [facility] but she needs a letter on our letterhead stating that it is ok due to being labeled disable.   Please Advise.

## 2023-05-05 NOTE — Telephone Encounter (Signed)
Patient called today stating [name] [name] did not receive paperwork and asked that I fax to an alternate number. [phone]. Faxed as requested.

## 2023-05-25 ENCOUNTER — Ambulatory Visit: Payer: 59 | Admitting: Cardiology

## 2023-06-10 ENCOUNTER — Other Ambulatory Visit: Payer: Self-pay

## 2023-06-10 MED ORDER — ALBUTEROL SULFATE (2.5 MG/3ML) 0.083% IN NEBU: 2.5000 mg | INHALATION_SOLUTION | Freq: Four times a day (QID) | RESPIRATORY_TRACT | 6 refills | Status: AC | PRN

## 2023-06-10 NOTE — Telephone Encounter (Signed)
Patient called and requested a prescription send into pharmacy for albuterol nebulizer solution. She reports having a nebulizer at home and use it daily and doesn't have anymore solution. Correct albuterol is pending to be filled due to medication never filled by [facility].

## 2023-06-15 ENCOUNTER — Ambulatory Visit: Payer: 59 | Admitting: Nurse Practitioner

## 2023-06-21 ENCOUNTER — Other Ambulatory Visit: Payer: Self-pay | Admitting: Nurse Practitioner

## 2023-06-21 DIAGNOSIS — F418 Other specified anxiety disorders: Secondary | ICD-10-CM

## 2023-06-26 ENCOUNTER — Encounter: Payer: Self-pay | Admitting: Nurse Practitioner

## 2023-07-03 ENCOUNTER — Telehealth: Payer: Self-pay

## 2023-07-03 NOTE — Telephone Encounter (Signed)
These medications needs to go through her surgeon and they need to be aware if she is having issues

## 2023-07-03 NOTE — Telephone Encounter (Signed)
Patient called and notified. Patient states that she already called them and they're not responding. But they told her to take Extra strength Tylenol. Message routed to PCP Janyth Contes, Janene Harvey, NP

## 2023-07-03 NOTE — Telephone Encounter (Signed)
Patient called and wanted to know if you could send muscle relaxer into pharmacy for her. She states that she had hip replacement surgery in October 2024. She states that she's still having some discomfort and this will help. Message routed to PCP Janyth Contes, Janene Harvey, NP

## 2023-07-03 NOTE — Telephone Encounter (Signed)
I called patient to notify her. Voicemail box is full and cannot accept new messages at this time. Another attempt will be made to contact patient.

## 2023-07-06 ENCOUNTER — Encounter: Payer: 59 | Admitting: Nurse Practitioner

## 2023-07-07 NOTE — Progress Notes (Signed)
This encounter was created in error - please disregard.

## 2023-07-14 ENCOUNTER — Ambulatory Visit (HOSPITAL_COMMUNITY): Payer: Self-pay

## 2023-07-17 ENCOUNTER — Ambulatory Visit (INDEPENDENT_AMBULATORY_CARE_PROVIDER_SITE_OTHER): Payer: 59 | Admitting: Nurse Practitioner

## 2023-07-17 ENCOUNTER — Encounter: Payer: Self-pay | Admitting: Nurse Practitioner

## 2023-07-17 VITALS — BP 130/82 | HR 79 | Temp 97.8°F | Ht 63.0 in | Wt 196.0 lb

## 2023-07-17 DIAGNOSIS — M1611 Unilateral primary osteoarthritis, right hip: Secondary | ICD-10-CM

## 2023-07-17 DIAGNOSIS — J449 Chronic obstructive pulmonary disease, unspecified: Secondary | ICD-10-CM

## 2023-07-17 DIAGNOSIS — I5032 Chronic diastolic (congestive) heart failure: Secondary | ICD-10-CM

## 2023-07-17 DIAGNOSIS — I480 Paroxysmal atrial fibrillation: Secondary | ICD-10-CM

## 2023-07-17 DIAGNOSIS — F330 Major depressive disorder, recurrent, mild: Secondary | ICD-10-CM

## 2023-07-17 DIAGNOSIS — E782 Mixed hyperlipidemia: Secondary | ICD-10-CM

## 2023-07-17 DIAGNOSIS — J069 Acute upper respiratory infection, unspecified: Secondary | ICD-10-CM | POA: Diagnosis not present

## 2023-07-17 DIAGNOSIS — I10 Essential (primary) hypertension: Secondary | ICD-10-CM

## 2023-07-17 LAB — POC COVID19 BINAXNOW: SARS Coronavirus 2 Ag: NEGATIVE

## 2023-07-17 MED ORDER — PREDNISONE 10 MG (21) PO TBPK: ORAL_TABLET | ORAL | 0 refills | Status: DC

## 2023-07-17 MED ORDER — ALBUTEROL SULFATE HFA 108 (90 BASE) MCG/ACT IN AERS
2.0000 | INHALATION_SPRAY | Freq: Four times a day (QID) | RESPIRATORY_TRACT | 11 refills | Status: AC | PRN
Start: 2023-07-17 — End: ?

## 2023-07-17 NOTE — Progress Notes (Signed)
Careteam: Patient Care Team: [name], NP as PCP - General (Geriatric Medicine) Tessa Lerner, DO as PCP - Cardiology (Cardiology)  PLACE OF SERVICE:  [facility]  Advanced Directive information    Allergies  Allergen Reactions   Sodium Ferric Gluconate [Ferrous Gluconate] Other (See Comments)    Patient had near syncope with abdominal pain and diaphoresis about an hour after IV ferric gluconate although it was while she was on commode having bowel movements   Imdur [Isosorbide Nitrate]     headache   Tetracyclines & Related Itching    Chief Complaint  Patient presents with   Medical Management of Chronic Issues    4 month follow-up. Patient c/o cough, severe congestion, scratchy throat, wheezing, and sluggish x 5 days. Patient denies fever or chills. Refill albuterol inhaler. Declined Pap smear recommendation.      HPI: Patient is a 62 y.o. female for routine follow up.   She has a URI- daughter and granddaughter has been sick She is having some wheezing and using albuterol which has been helping.  No fevers or chills Sore throat.  Nasal congestion Cough and congestion.  No shortness of breath or chest pains No palpitations.  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 Negative for chills and fever.  HENT:  Positive for congestion and sore throat. Negative for hearing loss and sinus pain.   Respiratory:  Positive for cough, sputum production and wheezing.   Cardiovascular:  Negative for chest pain and palpitations.  Gastrointestinal:  Negative for constipation, diarrhea, nausea and vomiting.  Genitourinary:  Negative for dysuria, frequency and urgency.  Musculoskeletal:  Negative for joint pain and myalgias.  Neurological:  Negative for dizziness and headaches.   Psychiatric/Behavioral:  Negative for depression and memory loss. The patient is not nervous/anxious and does not have insomnia.     Past Medical History:  Diagnosis Date   Anemia    Anxiety    Arthritis    Asthma    Bloody stool    Chronic diastolic heart failure (HCC)    Dental decay    Dysrhythmia    Atrial flutter   Gastritis, Helicobacter pylori 06/22/2015   Hypertension    Paroxysmal atrial fibrillation (HCC)    Pneumonia 06/21/2015   Prepyloric ulcer 06/22/2015   Seasonal allergies    Sleep apnea    no CPAP   Past Surgical History:  Procedure Laterality Date   BARTHOLIN CYST MARSUPIALIZATION Left 07/22/1995   BIOPSY  11/04/2021   Procedure: BIOPSY;  Surgeon: Kerin Salen, MD;  Location: [facility];  Service: Gastroenterology;;   BUBBLE STUDY  10/31/2021   Procedure: BUBBLE STUDY;  Surgeon: Tessa Lerner, DO;  Location: [facility];  Service: Cardiovascular;;   CARDIAC SURGERY N/A 07/21/1961   Repair of PFO   CARDIOVERSION N/A 10/31/2021   Procedure: CARDIOVERSION;  Surgeon: Tessa Lerner, DO;  Location: [facility];  Service: Cardiovascular;  Laterality: N/A;   COLONOSCOPY N/A 07/21/2000   Performed secondary to mother's diagnosis of colon CA at age 61   COLONOSCOPY WITH PROPOFOL N/A 11/04/2021   Procedure: COLONOSCOPY WITH PROPOFOL;  Surgeon: Kerin Salen, MD;  Location: [facility];  Service: Gastroenterology;  Laterality: N/A;   ESOPHAGOGASTRODUODENOSCOPY (EGD) WITH PROPOFOL N/A 06/22/2015   Procedure: ESOPHAGOGASTRODUODENOSCOPY (EGD) WITH PROPOFOL;  Surgeon: Willis Modena, MD;  Location: [facility];  Service: Endoscopy;  Laterality: N/A;   ESOPHAGOGASTRODUODENOSCOPY (EGD) WITH PROPOFOL N/A 11/04/2021   Procedure: ESOPHAGOGASTRODUODENOSCOPY (EGD) WITH PROPOFOL;  Surgeon: Kerin Salen, MD;  Location: [facility];  Service: Gastroenterology;  Laterality: N/A;   FRACTURE SURGERY Left 2005   ORIF tibia   NASAL SINUS SURGERY Bilateral 07/21/1982   OVARIAN CYST  REMOVAL Left 07/21/1988   POLYPECTOMY  11/04/2021   Procedure: POLYPECTOMY;  Surgeon: Kerin Salen, MD;  Location: [facility];  Service: Gastroenterology;;   TEE WITHOUT CARDIOVERSION N/A 10/31/2021   Procedure: TRANSESOPHAGEAL ECHOCARDIOGRAM (TEE);  Surgeon: Tessa Lerner, DO;  Location: [facility];  Service: Cardiovascular;  Laterality: N/A;   TOTAL HIP ARTHROPLASTY Right 04/22/2023   Procedure: TOTAL HIP ARTHROPLASTY ANTERIOR APPROACH;  Surgeon: Samson Frederic, MD;  Location: [facility];  Service: Orthopedics;  Laterality: Right;  140   TUBAL LIGATION     UMBILICAL HERNIA REPAIR N/A 07/22/2003   Social History:   reports that she has never smoked. She has never used smokeless tobacco. She reports that she does not currently use alcohol. She reports that she does not use drugs.  Family History  Problem Relation Age of Onset   Colon cancer Mother 36   Hypertension Mother    Cancer Father 11       liver cancer   Hypertension Father    Cerebral aneurysm Sister    Goiter Sister    Breast cancer Neg Hx     Medications: Patient's Medications  New Prescriptions   No medications on file  Previous Medications   ALBUTEROL (PROVENTIL) (2.5 MG/3ML) 0.083% NEBULIZER SOLUTION    Take 3 mLs (2.5 mg total) by nebulization every 6 (six) hours as needed for wheezing or shortness of breath.   ALBUTEROL (VENTOLIN HFA) 108 (90 BASE) MCG/ACT INHALER    Inhale 2 puffs into the lungs every 6 (six) hours as needed for wheezing or shortness of breath.   APIXABAN (ELIQUIS) 5 MG TABS TABLET    Take 1 tablet (5 mg total) by mouth 2 (two) times daily.   ATORVASTATIN (LIPITOR) 20 MG TABLET    TAKE 1 TABLET (20 MG TOTAL) BY MOUTH AT BEDTIME FOR CHOLESTEROL   BLOOD PRESSURE MONITORING (B-D ASSURE BPM/AUTO ARM CUFF) MISC    Use to test blood pressure daily. Dx:I10   BREO ELLIPTA 200-25 MCG/ACT AEPB    Inhale 1 puff into the lungs daily.   CHOLECALCIFEROL (VITAMIN D3) 25 MCG (1000 UNIT) TABLET    Take 1,000 Units  by mouth in the morning.   METHOCARBAMOL (ROBAXIN) 500 MG TABLET    Take 1 tablet (500 mg total) by mouth every 6 (six) hours as needed.   METOPROLOL SUCCINATE (TOPROL-XL) 25 MG 24 HR TABLET    TAKE 1 TABLET BY MOUTH DAILY. TAKE WITH OR IMMEDIATELY FOLLOWING A MEAL.   ONDANSETRON (ZOFRAN) 4 MG TABLET    Take 1 tablet (4 mg total) by mouth every 8 (eight) hours as needed for nausea or vomiting.   OXYGEN    Inhale 2 L/min into the lungs as needed (wheezing/respiratory issues.).   SACUBITRIL-VALSARTAN (ENTRESTO) 24-26 MG    Take 1 tablet by mouth 2 (two) times daily.   TRAZODONE (DESYREL) 50 MG TABLET    TAKE 1 TABLET BY MOUTH EVERYDAY AT BEDTIME  Modified Medications   No medications on file  Discontinued Medications   No medications on file    Physical Exam:  Vitals:   07/17/23 0916  BP: 130/82  Pulse: 79  Temp: 97.8 F (36.6 C)  TempSrc: Temporal  SpO2: 99%  Weight: 196 lb (88.9 kg)  Height: 5\' 3"  (1.6 m)   Body mass index is 34.72 kg/m. Wt Readings from Last 3 Encounters:  07/17/23 196 lb (88.9 kg)  04/22/23 197 lb (89.4 kg)  04/20/23 197 lb (89.4 kg)    Physical Exam Constitutional:      General: She is not in acute distress.    Appearance: She is well-developed. She is not diaphoretic.  HENT:     Head: Normocephalic and atraumatic.     Mouth/Throat:     Pharynx: No oropharyngeal exudate.  Eyes:     Conjunctiva/sclera: Conjunctivae normal.     Pupils: Pupils are equal, round, and reactive to light.  Cardiovascular:     Rate and Rhythm: Normal rate and regular rhythm.     Heart sounds: Normal heart sounds.  Pulmonary:     Effort: Pulmonary effort is normal.     Breath sounds: Wheezing (left upper lobe) present.  Abdominal:     General: Bowel sounds are normal.     Palpations: Abdomen is soft.  Musculoskeletal:     Cervical back: Normal range of motion and neck supple.     Right lower leg: No edema.     Left lower leg: No edema.  Skin:    General: Skin is  warm and dry.  Neurological:     Mental Status: She is alert.  Psychiatric:        Mood and Affect: Mood normal.   Labs reviewed: Basic Metabolic Panel: Recent Labs    08/20/22 0000 10/04/22 1355 02/16/23 1419 04/10/23 1039 04/23/23 0304  NA 140   < > 140 139 134*  K 3.5   < > 3.8 4.2 4.0  CL 104   < > 103 104 102  CO2 28*   < > 27 26 24   GLUCOSE  --    < > 80 92 128*  BUN 7   < > 6* 11 10  CREATININE 0.6   < > 0.42* 0.44 0.56  CALCIUM 9.2   < > 9.1 9.0 8.2*  TSH 0.47  --   --   --   --    < > = values in this interval not displayed.   Liver Function Tests: Recent Labs    10/04/22 1355 10/05/22 1937 12/08/22 0000 02/16/23 1419 04/10/23 1039  AST 17 17  --  12 15  ALT 13 12  --  11 13  ALKPHOS 69 63  --   --  52  BILITOT 1.3* 1.3*  --  0.9 0.9  PROT 7.4 7.3  --  6.3 7.0  ALBUMIN 4.0 3.9 4.5  --  3.8   Recent Labs    10/04/22 1355 10/05/22 1937  LIPASE 23 22   No results for input(s): "AMMONIA" in the last 8760 hours. CBC: Recent Labs    10/04/22 1355 10/05/22 1937 12/18/22 2102 02/16/23 1419 04/10/23 1039 04/23/23 0304  WBC 11.0* 9.9   < > 5.0 5.4 11.5*  NEUTROABS 8.2* 7.1  --  2,480  --   --   HGB 13.2 12.6   < > 11.5* 12.6 9.3*  HCT 40.9 38.5   < > 35.6 39.0 28.9*  MCV 88.7 88.1   < > 88.6 91.8 92.6  PLT 213 204   < > 203 208 184   < > = values in this interval not displayed.   Lipid Panel: Recent Labs  08/20/22 0000 02/16/23 1419  CHOL 136 168  HDL 54 52  LDLCALC 69 101*  TRIG 54 60  CHOLHDL  --  3.2   TSH: Recent Labs    08/20/22 0000  TSH 0.47   A1C: Lab Results  Component Value Date   HGBA1C 5.6 10/30/2021     Assessment/Plan 1. Chronic obstructive pulmonary disease, unspecified COPD type (HCC) -overall stable - albuterol (VENTOLIN HFA) 108 (90 Base) MCG/ACT inhaler; Inhale 2 puffs into the lungs every 6 (six) hours as needed for wheezing or shortness of breath.  Dispense: 8.5 g; Refill: 11  2. Upper respiratory  tract infection, unspecified type (Primary) -- albuterol (VENTOLIN HFA) 108 (90 Base) MCG/ACT inhaler; Inhale 2 puffs into the lungs every 6 (six) hours as needed for wheezing or shortness of breath.  Dispense: 8.5 g; Refill: 11 - POC COVID-19 negative - predniSONE (STERAPRED UNI-PAK 21 TAB) 10 MG (21) TBPK tablet; Use as directed  Dispense: 21 tablet; Refill: 0 -Netipot or saline wash daily Plain nasal saline spray throughout the day as needed May use tylenol 325 mg 2 tablets every 6-8 hours as needed aches and pains or sore throat humidifier in the home to help with the dry air Mucinex DM by mouth twice daily as needed for cough and chest congestion with full glass of water  Keep well hydrated Proper nutrition  Avoid forcefully blowing nose Vit c 1000 mg daily Vit d 2000 units daily  Strict follow up precautions discussed  3. Osteoarthritis of right hip, unspecified osteoarthritis type S/p replacement, continue with PT  4. Chronic diastolic CHF (congestive heart failure) (HCC) -euvolemic, continues on entrestro  5. Mild episode of recurrent major depressive disorder (HCC) -continues on trazodone  6. Paroxysmal atrial fibrillation (HCC) Rate controlled, continues on eliquis for anticoagulation  - CBC with Differential/Platelet  7. Primary hypertension Blood pressure well controlled, goal bp <140/90 Continue current medications and dietary modifications follow metabolic panel - COMPLETE METABOLIC PANEL WITH GFR - CBC with Differential/Platelet  8. Mixed hyperlipidemia -continues on lipitor with dietary modifications  - Lipid panel - COMPLETE METABOLIC PANEL WITH GFR    Return in about 6 months (around 01/15/2024) for routine follow up .:  Joei Frangos K. Biagio Borg [facility] [phone]

## 2023-07-17 NOTE — Patient Instructions (Signed)
Netipot or saline wash daily Plain nasal saline spray throughout the day as needed May use tylenol 325 mg 2 tablets every 6-8 hours as needed aches and pains or sore throat humidifier in the home to help with the dry air Mucinex DM by mouth twice daily as needed for cough and chest congestion with full glass of water for 7 days  Keep well hydrated Proper nutrition  Avoid forcefully blowing nose Vit c 1000 mg 1-2 daily Vit d 2000 units daily

## 2023-07-18 LAB — CBC WITH DIFFERENTIAL/PLATELET
Absolute Lymphocytes: 2125 {cells}/uL (ref 850–3900)
Absolute Monocytes: 735 {cells}/uL (ref 200–950)
Basophils Absolute: 40 {cells}/uL (ref 0–200)
Basophils Relative: 0.5 %
Eosinophils Absolute: 71 {cells}/uL (ref 15–500)
Eosinophils Relative: 0.9 %
HCT: 37.6 % (ref 35.0–45.0)
Hemoglobin: 11.9 g/dL (ref 11.7–15.5)
MCH: 27.5 pg (ref 27.0–33.0)
MCHC: 31.6 g/dL — ABNORMAL LOW (ref 32.0–36.0)
MCV: 86.8 fL (ref 80.0–100.0)
MPV: 11.6 fL (ref 7.5–12.5)
Monocytes Relative: 9.3 %
Neutro Abs: 4930 {cells}/uL (ref 1500–7800)
Neutrophils Relative %: 62.4 %
Platelets: 277 10*3/uL (ref 140–400)
RBC: 4.33 10*6/uL (ref 3.80–5.10)
RDW: 14.1 % (ref 11.0–15.0)
Total Lymphocyte: 26.9 %
WBC: 7.9 10*3/uL (ref 3.8–10.8)

## 2023-07-18 LAB — COMPLETE METABOLIC PANEL WITH GFR
AG Ratio: 1.4 (calc) (ref 1.0–2.5)
ALT: 9 U/L (ref 6–29)
AST: 14 U/L (ref 10–35)
Albumin: 4.1 g/dL (ref 3.6–5.1)
Alkaline phosphatase (APISO): 72 U/L (ref 37–153)
BUN: 12 mg/dL (ref 7–25)
CO2: 24 mmol/L (ref 20–32)
Calcium: 8.9 mg/dL (ref 8.6–10.4)
Chloride: 104 mmol/L (ref 98–110)
Creat: 0.61 mg/dL (ref 0.50–1.05)
Globulin: 3 g/dL (ref 1.9–3.7)
Glucose, Bld: 86 mg/dL (ref 65–139)
Potassium: 4.2 mmol/L (ref 3.5–5.3)
Sodium: 139 mmol/L (ref 135–146)
Total Bilirubin: 0.4 mg/dL (ref 0.2–1.2)
Total Protein: 7.1 g/dL (ref 6.1–8.1)
eGFR: 101 mL/min/{1.73_m2} (ref >=60)

## 2023-07-18 LAB — LIPID PANEL
Cholesterol: 180 mg/dL (ref <200)
HDL: 63 mg/dL (ref >=50)
LDL Cholesterol (Calc): 101 mg/dL — ABNORMAL HIGH
Non-HDL Cholesterol (Calc): 117 mg/dL (ref <130)
Total CHOL/HDL Ratio: 2.9 (calc) (ref <5.0)
Triglycerides: 71 mg/dL (ref <150)

## 2023-07-27 ENCOUNTER — Emergency Department (HOSPITAL_COMMUNITY): Payer: 59

## 2023-07-27 ENCOUNTER — Other Ambulatory Visit: Payer: Self-pay

## 2023-07-27 ENCOUNTER — Encounter (HOSPITAL_COMMUNITY): Payer: Self-pay | Admitting: *Deleted

## 2023-07-27 ENCOUNTER — Encounter (HOSPITAL_COMMUNITY): Payer: Self-pay

## 2023-07-27 ENCOUNTER — Ambulatory Visit (HOSPITAL_COMMUNITY)
Admission: EM | Admit: 2023-07-27 | Discharge: 2023-07-27 | Disposition: A | Discharge status: Home / Self Care | Payer: 59 | Attending: Internal Medicine | Admitting: Internal Medicine

## 2023-07-27 ENCOUNTER — Emergency Department (HOSPITAL_COMMUNITY)
Admission: EM | Admit: 2023-07-27 | Discharge: 2023-07-27 | Discharge status: Against Medical Advice | Payer: 59 | Attending: Emergency Medicine | Admitting: Emergency Medicine

## 2023-07-27 DIAGNOSIS — R0789 Other chest pain: Secondary | ICD-10-CM

## 2023-07-27 DIAGNOSIS — R9431 Abnormal electrocardiogram [ECG] [EKG]: Secondary | ICD-10-CM | POA: Insufficient documentation

## 2023-07-27 DIAGNOSIS — R519 Headache, unspecified: Secondary | ICD-10-CM | POA: Diagnosis not present

## 2023-07-27 DIAGNOSIS — Z5321 Procedure and treatment not carried out due to patient leaving prior to being seen by health care provider: Secondary | ICD-10-CM | POA: Diagnosis not present

## 2023-07-27 LAB — CBC
HCT: 35 % — ABNORMAL LOW (ref 36.0–46.0)
Hemoglobin: 11.2 g/dL — ABNORMAL LOW (ref 12.0–15.0)
MCH: 27.7 pg (ref 26.0–34.0)
MCHC: 32 g/dL (ref 30.0–36.0)
MCV: 86.4 fL (ref 80.0–100.0)
Platelets: 141 10*3/uL — ABNORMAL LOW (ref 150–400)
RBC: 4.05 MIL/uL (ref 3.87–5.11)
RDW: 15 % (ref 11.5–15.5)
WBC: 7.8 10*3/uL (ref 4.0–10.5)
nRBC: 0 % (ref 0.0–0.2)

## 2023-07-27 LAB — BASIC METABOLIC PANEL
Anion gap: 11 (ref 5–15)
BUN: 13 mg/dL (ref 8–23)
CO2: 21 mmol/L — ABNORMAL LOW (ref 22–32)
Calcium: 9 mg/dL (ref 8.9–10.3)
Chloride: 105 mmol/L (ref 98–111)
Creatinine, Ser: 0.53 mg/dL (ref 0.44–1.00)
GFR, Estimated: >60 mL/min (ref >60)
Glucose, Bld: 92 mg/dL (ref 70–99)
Potassium: 4.2 mmol/L (ref 3.5–5.1)
Sodium: 137 mmol/L (ref 135–145)

## 2023-07-27 LAB — TROPONIN I (HIGH SENSITIVITY): Troponin I (High Sensitivity): 8 ng/L (ref <18)

## 2023-07-27 MED ORDER — OXYCODONE-ACETAMINOPHEN 5-325 MG PO TABS
1.0000 | ORAL_TABLET | Freq: Once | ORAL | Status: DC
Start: 2023-07-27 — End: 2023-07-28

## 2023-07-27 NOTE — ED Triage Notes (Signed)
 PT now in Triage room and Provider L Lequita Halt notified Pt is in Triage.

## 2023-07-27 NOTE — ED Triage Notes (Signed)
 PT reported she was just going call her PCP in Am . Pt instructed she would have to leave AMA.

## 2023-07-27 NOTE — ED Provider Notes (Signed)
 Patient presents to urgent care complaining of not feeling right, states she feels that her blood pressure is high and has tightness in her chest.  EKG performed in triage revealed ST depression in lead III and ST elevation in V2.  Patient advised to go to the emergency room for further evaluation to rule out myocardial infarction.  Patient agreed to go now.   Joesph Shaver Scales, [place] 07/27/23 1839

## 2023-07-27 NOTE — ED Notes (Signed)
 Patient left.

## 2023-07-27 NOTE — ED Triage Notes (Signed)
 Pt reports 30 mins ago she did not feel right. Pt though there BP was up. P talso had tightness in chest.

## 2023-07-27 NOTE — ED Triage Notes (Signed)
 Pt states went to UC for left sided chest pain and HA and "I felt my blood pressure was high." Pt states felt left eye was dry and had pressure so went to UC and had an EKG. Pt was sent here for an abnormal EKG.

## 2023-07-27 NOTE — ED Notes (Signed)
 Patient is being discharged from the Urgent Care and sent to the Emergency Department via POV . Per manuelita Search, PA-C, patient is in need of higher level of care due to chest pain. Patient is aware and verbalizes understanding of plan of care.  Vitals:   07/27/23 1805  BP: (!) 187/79  Pulse: 65  Resp: 20  Temp: 98.7 F (37.1 C)  SpO2: 97%

## 2023-07-27 NOTE — ED Triage Notes (Signed)
 PT has now decided to stay to be seen by Provider.

## 2023-07-27 NOTE — ED Triage Notes (Signed)
 PT went to car to wait for room.

## 2023-07-27 NOTE — ED Triage Notes (Signed)
 PT called back to triage at request of Provider Helaine Chess.

## 2023-07-28 ENCOUNTER — Encounter: Payer: Self-pay | Admitting: Cardiology

## 2023-07-28 ENCOUNTER — Telehealth: Payer: Self-pay

## 2023-07-28 NOTE — Transitions of Care (Post Inpatient/ED Visit) (Signed)
 07/28/2023  Name: [name] [name] MRN: [id number] DOB: February 23, 1961  Today's TOC FU Call Status: Today's TOC FU Call Status:: Successful TOC FU Call Completed TOC FU Call Complete Date: 07/28/23 Patient's Name and Date of Birth confirmed.  Transition Care Management Follow-up Telephone Call Date of Discharge: 07/27/23 Discharge Facility: [facility]) Type of Discharge: Emergency Department Reason for ED Visit: Cardiac Conditions (Feeling of chest tightness.) How have you been since you were released from the hospital?: Better  Items Reviewed: Did you receive and understand the discharge instructions provided?: Yes Medications obtained,verified, and reconciled?: Yes (Medications Reviewed) Any new allergies since your discharge?: No Dietary orders reviewed?: No Do you have support at home?: Yes People in Home: child(ren), adult  Medications Reviewed Today: Medications Reviewed Today     Reviewed by Demba Nigh E, CMA (Certified Medical Assistant) on 07/28/23 at 1654  Med List Status: <None>   Medication Order Taking? Sig Documenting Provider Last Dose Status Informant  albuterol  (PROVENTIL ) (2.5 MG/3ML) 0.083% nebulizer solution [id number] Yes Take 3 mLs (2.5 mg total) by nebulization every 6 (six) hours as needed for wheezing or shortness of breath. [name], NP Taking Active   albuterol  (VENTOLIN  HFA) 108 (90 Base) MCG/ACT inhaler [id number] Yes Inhale 2 puffs into the lungs every 6 (six) hours as needed for wheezing or shortness of breath. [name], NP Taking Active   apixaban  (ELIQUIS ) 5 MG TABS tablet [id number] Yes Take 1 tablet (5 mg total) by mouth 2 (two) times daily. [name], NP Taking Active Self  atorvastatin  (LIPITOR) 20 MG tablet [id number] Yes TAKE 1 TABLET (20 MG TOTAL) BY MOUTH AT BEDTIME FOR CHOLESTEROL Eubanks, Jessica K, NP Taking Active Self  BREO ELLIPTA  200-25 MCG/ACT AEPB [id number] Yes Inhale 1 puff into the lungs daily. [name], NP Taking Active Self  cholecalciferol  (VITAMIN D3) 25 MCG (1000 UNIT) tablet [id number] Yes Take 1,000 Units by mouth in the morning. [provider] Taking Active Self  metoprolol  succinate (TOPROL -XL) 25 MG 24 hr tablet [id number] Yes TAKE 1 TABLET BY MOUTH DAILY. TAKE WITH OR IMMEDIATELY FOLLOWING A MEAL. [name], NP Taking Active Self  OXYGEN  [id number] Yes Inhale 2 L/min into the lungs as needed (wheezing/respiratory issues.). [provider] Taking Active Self  predniSONE  (STERAPRED UNI-PAK 21 TAB) 10 MG (21) TBPK tablet [id number] Yes Use as directed [name], NP Taking Active   sacubitril -valsartan  (ENTRESTO ) 24-26 MG [id number] Yes Take 1 tablet by mouth 2 (two) times daily. [name], NP Taking Active Self  traZODone  (DESYREL ) 50 MG tablet [id number] Yes TAKE 1 TABLET BY MOUTH EVERYDAY AT BEDTIME Eubanks, Jessica K, NP Taking Active             Home Care and Equipment/Supplies: Were Home Health Services Ordered?: No Any new equipment or medical supplies ordered?: No  Functional Questionnaire: Do you need assistance with bathing/showering or dressing?: No Do you need assistance with meal preparation?: No Do you need assistance with eating?: No Do you have difficulty maintaining continence: No Do you need assistance with getting out of bed/getting out of a chair/moving?: No Do you have difficulty managing or taking your medications?: No  Follow up appointments reviewed: PCP Follow-up appointment confirmed?: Yes Date of PCP follow-up appointment?: 07/31/23 Follow-up Provider: Harlene Caro, NP Specialist Hospital Follow-up appointment confirmed?: No Do you need transportation to your follow-up appointment?: No Do you understand care options if your condition(s) worsen?: Yes-patient verbalized understanding  SIGNATURE: Vicente Weidler.D/RMA

## 2023-07-28 NOTE — Telephone Encounter (Signed)
 Looks like patient was seen in urgent care and was recommended to go to the hospital due to her symptoms of chest pain and abnormal EKG.  Not sure why she left the hospital without being seen.  Her initial cardiac biomarker, troponin was within normal limits. Initial EKG noted ST depressions which improved on follow-up ECGs.  Chest x-ray noted vascular congestion.  Calcified nodules in the mediastinum can be followed by PCP.  Her blood pressures are uncontrolled. Follow up w/ PCP  If she has chest pain she should go to the hospital for complete evaluation.  However, you can arrange follow-up with the practice based on her acuity.  Cythina Mickelsen [place], DO, FACC

## 2023-07-28 NOTE — Telephone Encounter (Signed)
 Pt calling in about her mychart sent. She would like all the results from the ED reviewed and Dr. Odis Hollingshead to advise on next steps. She had lab work, EKG, and chest CT done.

## 2023-07-28 NOTE — Telephone Encounter (Signed)
 Spoke with pt and went over Dr. Tyree note. Made an appt for the pt with Dr. Tolia for 08/18/23 for a f/u. Advised pt to call PCP office tomorrow to make an appt- copy of CXR result from hospital routed to PCP.    Looks like patient was seen in urgent care and was recommended to go to the hospital due to her symptoms of chest pain and abnormal EKG.   Not sure why she left the hospital without being seen.   Her initial cardiac biomarker, troponin was within normal limits. Initial EKG noted ST depressions which improved on follow-up ECGs.   Chest x-ray noted vascular congestion.  Calcified nodules in the mediastinum can be followed by PCP.   Her blood pressures are uncontrolled. Follow up w/ PCP   If she has chest pain she should go to the hospital for complete evaluation.  However, you can arrange follow-up with the practice based on her acuity.   Sunit [place], DO, FACC

## 2023-07-31 ENCOUNTER — Encounter: Payer: Self-pay | Admitting: Nurse Practitioner

## 2023-07-31 ENCOUNTER — Ambulatory Visit (INDEPENDENT_AMBULATORY_CARE_PROVIDER_SITE_OTHER): Payer: 59 | Admitting: Nurse Practitioner

## 2023-07-31 VITALS — BP 110/80 | HR 71 | Temp 96.8°F

## 2023-07-31 DIAGNOSIS — I10 Essential (primary) hypertension: Secondary | ICD-10-CM

## 2023-07-31 DIAGNOSIS — I480 Paroxysmal atrial fibrillation: Secondary | ICD-10-CM | POA: Diagnosis not present

## 2023-07-31 DIAGNOSIS — I5032 Chronic diastolic (congestive) heart failure: Secondary | ICD-10-CM

## 2023-07-31 MED ORDER — FUROSEMIDE 40 MG PO TABS: 40.0000 mg | ORAL_TABLET | Freq: Every day | ORAL | 0 refills | Status: DC

## 2023-07-31 MED ORDER — POTASSIUM CHLORIDE CRYS ER 10 MEQ PO TBCR: 10.0000 meq | EXTENDED_RELEASE_TABLET | Freq: Every day | ORAL | 0 refills | Status: DC

## 2023-07-31 NOTE — Progress Notes (Signed)
 Careteam: Patient Care Team: [name] Harlene POUR, NP as PCP - General (Geriatric Medicine) [name] [name] as PCP - Cardiology (Cardiology)  PLACE OF SERVICE:  [facility]  Advanced Directive information    Allergies  Allergen Reactions   Sodium Ferric Gluconate [Ferrous Gluconate] Other (See Comments)    Patient had near syncope with abdominal pain and diaphoresis about an hour after IV ferric gluconate although it was while she was on commode having bowel movements   Imdur [Isosorbide Nitrate]     headache   Tetracyclines & Related Itching    Chief Complaint  Patient presents with   Follow-up    ED follow-up from 07/27/2023, patient states tightness in chest return this morning and left eye feels dry. Here with son [name] [name].      HPI: Patient is a 63 y.o. female due to  ED follow up.   She went to urgent care due to chest pain and was told to go to the ED. She went to the ED but it was a 5 hour wait and she did not want to wait so she left.  She called the cardiologist and they made an appt with her later in the month.  She had a feeling earlier that she had pressure in her chest and thought her blood pressure was going up but when she came into the room during OV her blood pressure was normal She reports she is not having tightness or pressure now. She denies anxiety or depression No shortness of breath  No radiating pain.  She has had a mild cough.  No significant LE edema.   Noted to have some pulmonary vascular congestion on chest xray She was placed on farxiga  but could not tolerate  Review of Systems:  Review of Systems  Constitutional:  Positive for malaise/fatigue. Negative for chills, fever and weight loss.  HENT:  Negative for tinnitus.   Respiratory:  Positive for cough. Negative for sputum production and shortness of breath.   Cardiovascular:  Negative for chest pain, palpitations and leg swelling.  Gastrointestinal:  Negative for abdominal pain,  constipation, diarrhea and heartburn.  Genitourinary:  Negative for dysuria, frequency and urgency.  Musculoskeletal:  Negative for back pain, falls, joint pain and myalgias.  Skin: Negative.   Neurological:  Negative for dizziness and headaches.  Psychiatric/Behavioral:  Negative for depression and memory loss. The patient does not have insomnia.     Past Medical History:  Diagnosis Date   Anemia    Anxiety    Arthritis    Asthma    Bloody stool    Chronic diastolic heart failure (HCC)    Dental decay    Dysrhythmia    Atrial flutter   Gastritis, Helicobacter pylori 06/22/2015   Hypertension    Paroxysmal atrial fibrillation (HCC)    Pneumonia 06/21/2015   Prepyloric ulcer 06/22/2015   Seasonal allergies    Sleep apnea    no CPAP   Past Surgical History:  Procedure Laterality Date   BARTHOLIN CYST MARSUPIALIZATION Left 07/22/1995   BIOPSY  11/04/2021   Procedure: BIOPSY;  Surgeon: [name] Jasper, MD;  Location: [facility];  Service: Gastroenterology;;   BUBBLE STUDY  10/31/2021   Procedure: BUBBLE STUDY;  Surgeon: [name] [name];  Location: [facility];  Service: Cardiovascular;;   CARDIAC SURGERY N/A 07/21/1961   Repair of PFO   CARDIOVERSION N/A 10/31/2021   Procedure: CARDIOVERSION;  Surgeon: [name] [name];  Location: [facility];  Service: Cardiovascular;  Laterality:  N/A;   COLONOSCOPY N/A 07/21/2000   Performed secondary to mother's diagnosis of colon CA at age 43   COLONOSCOPY WITH PROPOFOL  N/A 11/04/2021   Procedure: COLONOSCOPY WITH PROPOFOL ;  Surgeon: [name] Jasper, MD;  Location: [facility];  Service: Gastroenterology;  Laterality: N/A;   ESOPHAGOGASTRODUODENOSCOPY (EGD) WITH PROPOFOL  N/A 06/22/2015   Procedure: ESOPHAGOGASTRODUODENOSCOPY (EGD) WITH PROPOFOL ;  Surgeon: [name] Cree, MD;  Location: [facility] END[facility]COPY;  Service: Endoscopy;  Laterality: N/A;   ESOPHAGOGASTRODUODENOSCOPY (EGD) WITH PROPOFOL  N/A 11/04/2021   Procedure: ESOPHAGOGASTRODUODENOSCOPY  (EGD) WITH PROPOFOL ;  Surgeon: [name] Jasper, MD;  Location: [facility];  Service: Gastroenterology;  Laterality: N/A;   FRACTURE SURGERY Left 2005   ORIF tibia   NASAL SINUS SURGERY Bilateral 07/21/1982   OVARIAN CYST REMOVAL Left 07/21/1988   POLYPECTOMY  11/04/2021   Procedure: POLYPECTOMY;  Surgeon: [name] Jasper, MD;  Location: [facility];  Service: Gastroenterology;;   TEE WITHOUT CARDIOVERSION N/A 10/31/2021   Procedure: TRANSESOPHAGEAL ECHOCARDIOGRAM (TEE);  Surgeon: [name] [name];  [facility]: [facility];  Service: Cardiovascular;  Laterality: N/A;   TOTAL HIP ARTHROPLASTY Right 04/22/2023   Procedure: TOTAL HIP ARTHROPLASTY ANTERIOR APPROACH;  Surgeon: [name] Rogue, MD;  Location: [facility];  Service: Orthopedics;  Laterality: Right;  140   TUBAL LIGATION     UMBILICAL HERNIA REPAIR N/A 07/22/2003   Social History:   reports that she has never smoked. She has never used smokeless tobacco. She reports that she does not currently use alcohol . She reports that she does not use drugs.  Family History  Problem Relation Age of Onset   Colon cancer Mother 53   Hypertension Mother    Cancer Father 15       liver cancer   Hypertension Father    Cerebral aneurysm Sister    Goiter Sister    Breast cancer Neg Hx     Medications: Patient's Medications  New Prescriptions   No medications on file  Previous Medications   ALBUTEROL  (PROVENTIL ) (2.5 MG/3ML) 0.083% NEBULIZER SOLUTION    Take 3 mLs (2.5 mg total) by nebulization every 6 (six) hours as needed for wheezing or shortness of breath.   ALBUTEROL  (VENTOLIN  HFA) 108 (90 BASE) MCG/ACT INHALER    Inhale 2 puffs into the lungs every 6 (six) hours as needed for wheezing or shortness of breath.   APIXABAN  (ELIQUIS ) 5 MG TABS TABLET    Take 1 tablet (5 mg total) by mouth 2 (two) times daily.   ATORVASTATIN  (LIPITOR) 20 MG TABLET    TAKE 1 TABLET (20 MG TOTAL) BY MOUTH AT BEDTIME FOR CHOLESTEROL   BIOFLAVONOID PRODUCTS (VITAMIN C PLUS  PO)    Take 1 tablet by mouth daily.   BREO ELLIPTA  200-25 MCG/ACT AEPB    Inhale 1 puff into the lungs daily.   CHOLECALCIFEROL  (VITAMIN D3) 25 MCG (1000 UNIT) TABLET    Take 1,000 Units by mouth in the morning.   METOPROLOL  SUCCINATE (TOPROL -XL) 25 MG 24 HR TABLET    TAKE 1 TABLET BY MOUTH DAILY. TAKE WITH OR IMMEDIATELY FOLLOWING A MEAL.   OXYGEN     Inhale 2 L/min into the lungs as needed (wheezing/respiratory issues.).   PREDNISONE  (STERAPRED UNI-PAK 21 TAB) 10 MG (21) TBPK TABLET    Use as directed   SACUBITRIL -VALSARTAN  (ENTRESTO ) 24-26 MG    Take 1 tablet by mouth 2 (two) times daily.   TRAZODONE  (DESYREL ) 50 MG TABLET    TAKE 1 TABLET BY MOUTH EVERYDAY AT BEDTIME  Modified Medications  No medications on file  Discontinued Medications   No medications on file    Physical Exam:  Vitals:   07/31/23 1103  BP: 110/80  Pulse: 71  Temp: (!) 96.8 F (36 C)  TempSrc: Temporal  SpO2: 98%   There is no height or weight on file to calculate BMI. Wt Readings from Last 3 Encounters:  07/27/23 195 lb 15.8 oz (88.9 kg)  07/17/23 196 lb (88.9 kg)  04/22/23 197 lb (89.4 kg)    Physical Exam Constitutional:      General: She is not in acute distress.    Appearance: She is well-developed. She is not diaphoretic.  HENT:     Head: Normocephalic and atraumatic.     Mouth/Throat:     Pharynx: No oropharyngeal exudate.  Eyes:     Conjunctiva/sclera: Conjunctivae normal.     Pupils: Pupils are equal, round, and reactive to light.  Cardiovascular:     Rate and Rhythm: Normal rate and regular rhythm.     Heart sounds: Normal heart sounds.  Pulmonary:     Effort: Pulmonary effort is normal.     Breath sounds: Normal breath sounds.  Abdominal:     General: Bowel sounds are normal.     Palpations: Abdomen is soft.  Musculoskeletal:     Cervical back: Normal range of motion and neck supple.     Right lower leg: No edema.     Left lower leg: No edema.  Skin:    General: Skin is warm  and dry.  Neurological:     Mental Status: She is alert.  Psychiatric:        Mood and Affect: Mood normal.     Labs reviewed: Basic Metabolic Panel: Recent Labs    08/20/22 0000 10/04/22 1355 04/23/23 0304 07/17/23 0955 07/27/23 1920  NA 140   < > 134* 139 137  K 3.5   < > 4.0 4.2 4.2  CL 104   < > 102 104 105  CO2 28*   < > 24 24 21*  GLUCOSE  --    < > 128* 86 92  BUN 7   < > 10 12 13   CREATININE 0.6   < > 0.56 0.61 0.53  CALCIUM  9.2   < > 8.2* 8.9 9.0  TSH 0.47  --   --   --   --    < > = values in this interval not displayed.   Liver Function Tests: Recent Labs    10/04/22 1355 10/05/22 1937 12/08/22 0000 02/16/23 1419 04/10/23 1039 07/17/23 0955  AST 17 17  --  12 15 14   ALT 13 12  --  11 13 9   ALKPHOS 69 63  --   --  52  --   BILITOT 1.3* 1.3*  --  0.9 0.9 0.4  PROT 7.4 7.3  --  6.3 7.0 7.1  ALBUMIN 4.0 3.9 4.5  --  3.8  --    Recent Labs    10/04/22 1355 10/05/22 1937  LIPASE 23 22   No results for input(s): AMMONIA in the last 8760 hours. CBC: Recent Labs    10/05/22 1937 12/18/22 2102 02/16/23 1419 04/10/23 1039 04/23/23 0304 07/17/23 0955 07/27/23 1920  WBC 9.9   < > 5.0   < > 11.5* 7.9 7.8  NEUTROABS 7.1  --  2,480  --   --  4,930  --   HGB 12.6   < > 11.5*   < > 9.3*  11.9 11.2*  HCT 38.5   < > 35.6   < > 28.9* 37.6 35.0*  MCV 88.1   < > 88.6   < > 92.6 86.8 86.4  PLT 204   < > 203   < > 184 277 141*   < > = values in this interval not displayed.   Lipid Panel: Recent Labs    08/20/22 0000 02/16/23 1419 07/17/23 0955  CHOL 136 168 180  HDL 54 52 63  LDLCALC 69 101* 101*  TRIG 54 60 71  CHOLHDL  --  3.2 2.9   TSH: Recent Labs    08/20/22 0000  TSH 0.47   A1C: Lab Results  Component Value Date   HGBA1C 5.6 10/30/2021   DG Chest 2 View Addendum Date: 07/27/2023 ADDENDUM REPORT: 07/27/2023 20:06 ADDENDUM: Images from a different patient were mistakenly sent within this accession. The incorrect images have been  removed and replaced by the correct images which are interpreted below. FINDINGS: Stable cardiomegaly. Pulmonary vascular congestion. Calcified nodes in the mediastinum. No focal consolidation, pleural effusion, or pneumothorax. No acute displaced rib fractures. Focal pleural convexity about the right mid lung laterally corresponds to lipoma on chest CT 10/29/2021. IMPRESSION: Cardiomegaly and pulmonary vascular congestion. These results were called by telephone at the time of interpretation on 07/27/2023 at 8:06 pm to provider LAMAR SHAN , who verbally acknowledged these results. Electronically Signed   By: Norman Gatlin M.D.   On: 07/27/2023 20:06    Assessment/Plan 1. Chronic diastolic CHF (congestive heart failure) (HCC) (Primary) Had episodes of chest tightness and went to urgent care where she was sent to ED.  Xray as above, she did not stay to be seen due to long wait.  Pt denies chest pains, shortness of breath at this time but does have a dry cough.  Continues on entresto - will add lasix  daily for 5 days Discussed low sodium diet - furosemide  (LASIX ) 40 MG tablet; Take 1 tablet (40 mg total) by mouth daily.  Dispense: 5 tablet; Refill: 0 Potassium supplement to take with lasix  10 meq daily for 5 days   2. Primary hypertension -Blood pressure well controlled, goal bp <140/90 Continue current medications and dietary modifications follow metabolic panel  3. Paroxysmal atrial fibrillation (HCC) Rate controlled, on metoprolol     Strict follow up precautions given to go back to the ED for chest pain/pressure/tightness and to stay for work up.  Appt made for next week for follow up [name] [name] K. [name] [name] [facility] [phone]

## 2023-07-31 NOTE — Patient Instructions (Addendum)
 Get blood pressure cuff so you can monitor blood pressure at home- take after you take medication   If chest pain reoccurs/worsens to go to hospital immediately   To take lasix 40 mg daily for 5 days.  Take potassium 10 meq with lasix

## 2023-08-04 ENCOUNTER — Telehealth: Payer: Self-pay | Admitting: *Deleted

## 2023-08-04 ENCOUNTER — Telehealth (INDEPENDENT_AMBULATORY_CARE_PROVIDER_SITE_OTHER): Payer: 59 | Admitting: Nurse Practitioner

## 2023-08-04 ENCOUNTER — Encounter: Payer: Self-pay | Admitting: Nurse Practitioner

## 2023-08-04 DIAGNOSIS — I5032 Chronic diastolic (congestive) heart failure: Secondary | ICD-10-CM

## 2023-08-04 DIAGNOSIS — I10 Essential (primary) hypertension: Secondary | ICD-10-CM

## 2023-08-04 NOTE — Progress Notes (Signed)
  This service is provided via telemedicine  No vital signs collected/recorded due to the encounter was a telemedicine visit.   Location of patient (ex: home, work):  Home  Patient consents to a telephone visit:  Yes  Location of the provider (ex: office, home):  Office [place].   Name of any referring provider:  na  Names of all persons participating in the telemedicine service and their role in the encounter:  Dunya Meiners, Patient, Donzell Beal, CMA, Harlene An, NP  Time spent on call:  7:02

## 2023-08-04 NOTE — Progress Notes (Signed)
 Careteam: Patient Care Team: [name] Harlene POUR, NP as PCP - General (Geriatric Medicine) Michele Richardson, DO as PCP - Cardiology (Cardiology)  Advanced Directive information Does Patient Have a Medical Advance Directive?: No, Does patient want to make changes to medical advance directive?: No - Patient declined  Allergies  Allergen Reactions   Sodium Ferric Gluconate [Ferrous Gluconate] Other (See Comments)    Patient had near syncope with abdominal pain and diaphoresis about an hour after IV ferric gluconate although it was while she was on commode having bowel movements   Imdur [Isosorbide Nitrate]     headache   Tetracyclines & Related Itching    Chief Complaint  Patient presents with   Medical Management of Chronic Issues    Medical Management of Chronic Issues. Follow up Chest tightness.    Quality Metric Gaps    To discuss need for Pneumonia and Cervical Cancer Screening.      HPI: Patient is a 63 y.o. female for follow up.  She was started on lasix  at office visit last week, today is the last day on her lasix  40 mg daily (also on potassium 10 meq). She is following with cardiologist on 08/18/23   She no longer has any chest tightness, no pains  Cough has improved No shortness of breath.  No wheezing.  She is not able to take her blood pressure at home- has not gotten cuff.  Plans to get cuff at [employer]   Review of Systems:  Review of Systems  Constitutional:  Negative for chills, fever and weight loss.  HENT:  Negative for tinnitus.   Respiratory:  Negative for cough, sputum production and shortness of breath.   Cardiovascular:  Negative for chest pain, palpitations and leg swelling.  Gastrointestinal:  Negative for abdominal pain, constipation, diarrhea and heartburn.  Genitourinary:  Negative for dysuria, frequency and urgency.  Musculoskeletal:  Negative for back pain, falls, joint pain and myalgias.  Skin: Negative.   Neurological:  Negative for dizziness  and headaches.  Psychiatric/Behavioral:  Negative for depression and memory loss. The patient does not have insomnia.     Past Medical History:  Diagnosis Date   Anemia    Anxiety    Arthritis    Asthma    Bloody stool    Chronic diastolic heart failure (HCC)    Dental decay    Dysrhythmia    Atrial flutter   Gastritis, Helicobacter pylori 06/22/2015   Hypertension    Paroxysmal atrial fibrillation (HCC)    Pneumonia 06/21/2015   Prepyloric ulcer 06/22/2015   Seasonal allergies    Sleep apnea    no CPAP   Past Surgical History:  Procedure Laterality Date   BARTHOLIN CYST MARSUPIALIZATION Left 07/22/1995   BIOPSY  11/04/2021   Procedure: BIOPSY;  Surgeon: Saintclair Jasper, MD;  Location: [facility];  Service: Gastroenterology;;   VASSIE STUDY  10/31/2021   Procedure: BUBBLE STUDY;  Surgeon: Michele Richardson, DO;  Location: [facility];  Service: Cardiovascular;;   CARDIAC SURGERY N/A 07/21/1961   Repair of PFO   CARDIOVERSION N/A 10/31/2021   Procedure: CARDIOVERSION;  Surgeon: Michele Richardson, DO;  Location: [facility];  Service: Cardiovascular;  Laterality: N/A;   COLONOSCOPY N/A 07/21/2000   Performed secondary to mother's diagnosis of colon CA at age 89   COLONOSCOPY WITH PROPOFOL  N/A 11/04/2021   Procedure: COLONOSCOPY WITH PROPOFOL ;  Surgeon: Saintclair Jasper, MD;  Location[facility];  Service: Gastroenterology;  Laterality: N/A;   ESOPHAGOGASTRODUODENOSCOPY (EGD) WITH PROPOFOL   N/A 06/22/2015   Procedure: ESOPHAGOGASTRODUODENOSCOPY (EGD) WITH PROPOFOL ;  Surgeon: Elsie Cree, MD;  Location[facility] END[facility]COPY;  Service: Endoscopy;  Laterality: N/A;   ESOPHAGOGASTRODUODENOSCOPY (EGD) WITH PROPOFOL  N/A 11/04/2021   Procedure: ESOPHAGOGASTRODUODENOSCOPY (EGD) WITH PROPOFOL ;  Surgeon: Saintclair Jasper, MD;  Location: [facility];  Service: Gastroenterology;  Laterality: N/A;   FRACTURE SURGERY Left 2005   ORIF tibia   NASAL SINUS SURGERY Bilateral 07/21/1982   OVARIAN CYST REMOVAL Left  07/21/1988   POLYPECTOMY  11/04/2021   Procedure: POLYPECTOMY;  Surgeon: Saintclair Jasper, MD;  Location: [facility];  Service: Gastroenterology;;   TEE WITHOUT CARDIOVERSION N/A 10/31/2021   Procedure: TRANSESOPHAGEAL ECHOCARDIOGRAM (TEE);  Surgeon: Michele Richardson, DO;  Location: [facility];  Service: Cardiovascular;  Laterality: N/A;   TOTAL HIP ARTHROPLASTY Right 04/22/2023   Procedure: TOTAL HIP ARTHROPLASTY ANTERIOR APPROACH;  Surgeon: Fidel Rogue, MD;  Location: [facility];  Service: Orthopedics;  Laterality: Right;  140   TUBAL LIGATION     UMBILICAL HERNIA REPAIR N/A 07/22/2003   Social History:   reports that she has never smoked. She has never used smokeless tobacco. She reports that she does not currently use alcohol . She reports that she does not use drugs.  Family History  Problem Relation Age of Onset   Colon cancer Mother 65   Hypertension Mother    Cancer Father 60       liver cancer   Hypertension Father    Cerebral aneurysm Sister    Goiter Sister    Breast cancer Neg Hx     Medications: Patient's Medications  New Prescriptions   No medications on file  Previous Medications   ALBUTEROL  (PROVENTIL ) (2.5 MG/3ML) 0.083% NEBULIZER SOLUTION    Take 3 mLs (2.5 mg total) by nebulization every 6 (six) hours as needed for wheezing or shortness of breath.   ALBUTEROL  (VENTOLIN  HFA) 108 (90 BASE) MCG/ACT INHALER    Inhale 2 puffs into the lungs every 6 (six) hours as needed for wheezing or shortness of breath.   APIXABAN  (ELIQUIS ) 5 MG TABS TABLET    Take 1 tablet (5 mg total) by mouth 2 (two) times daily.   ATORVASTATIN  (LIPITOR) 20 MG TABLET    TAKE 1 TABLET (20 MG TOTAL) BY MOUTH AT BEDTIME FOR CHOLESTEROL   BIOFLAVONOID PRODUCTS (VITAMIN C PLUS PO)    Take 1 tablet by mouth daily.   BREO ELLIPTA  200-25 MCG/ACT AEPB    Inhale 1 puff into the lungs daily.   CHOLECALCIFEROL  (VITAMIN D3) 25 MCG (1000 UNIT) TABLET    Take 1,000 Units by mouth in the morning.   FUROSEMIDE   (LASIX ) 40 MG TABLET    Take 1 tablet (40 mg total) by mouth daily.   METOPROLOL  SUCCINATE (TOPROL -XL) 25 MG 24 HR TABLET    TAKE 1 TABLET BY MOUTH DAILY. TAKE WITH OR IMMEDIATELY FOLLOWING A MEAL.   OXYGEN     Inhale 2 L/min into the lungs as needed (wheezing/respiratory issues.).   POTASSIUM CHLORIDE  SA (KLOR-CON  M) 10 MEQ TABLET    Take 1 tablet (10 mEq total) by mouth daily.   SACUBITRIL -VALSARTAN  (ENTRESTO ) 24-26 MG    Take 1 tablet by mouth 2 (two) times daily.   TRAZODONE  (DESYREL ) 50 MG TABLET    TAKE 1 TABLET BY MOUTH EVERYDAY AT BEDTIME  Modified Medications   No medications on file  Discontinued Medications   No medications on file    Physical Exam:  There were no vitals filed for this visit. There is no  height or weight on file to calculate BMI. Wt Readings from Last 3 Encounters:  07/27/23 195 lb 15.8 oz (88.9 kg)  07/17/23 196 lb (88.9 kg)  04/22/23 197 lb (89.4 kg)    Physical Exam Constitutional:      Appearance: Normal appearance.  Pulmonary:     Effort: Pulmonary effort is normal.  Neurological:     Mental Status: She is alert. Mental status is at baseline.  Psychiatric:        Mood and Affect: Mood normal.     Labs reviewed: Basic Metabolic Panel: Recent Labs    08/20/22 0000 10/04/22 1355 04/23/23 0304 07/17/23 0955 07/27/23 1920  NA 140   < > 134* 139 137  K 3.5   < > 4.0 4.2 4.2  CL 104   < > 102 104 105  CO2 28*   < > 24 24 21*  GLUCOSE  --    < > 128* 86 92  BUN 7   < > 10 12 13   CREATININE 0.6   < > 0.56 0.61 0.53  CALCIUM  9.2   < > 8.2* 8.9 9.0  TSH 0.47  --   --   --   --    < > = values in this interval not displayed.   Liver Function Tests: Recent Labs    10/04/22 1355 10/05/22 1937 12/08/22 0000 02/16/23 1419 04/10/23 1039 07/17/23 0955  AST 17 17  --  12 15 14   ALT 13 12  --  11 13 9   ALKPHOS 69 63  --   --  52  --   BILITOT 1.3* 1.3*  --  0.9 0.9 0.4  PROT 7.4 7.3  --  6.3 7.0 7.1  ALBUMIN 4.0 3.9 4.5  --  3.8  --     Recent Labs    10/04/22 1355 10/05/22 1937  LIPASE 23 22   No results for input(s): AMMONIA in the last 8760 hours. CBC: Recent Labs    10/05/22 1937 12/18/22 2102 02/16/23 1419 04/10/23 1039 04/23/23 0304 07/17/23 0955 07/27/23 1920  WBC 9.9   < > 5.0   < > 11.5* 7.9 7.8  NEUTROABS 7.1  --  2,480  --   --  4,930  --   HGB 12.6   < > 11.5*   < > 9.3* 11.9 11.2*  HCT 38.5   < > 35.6   < > 28.9* 37.6 35.0*  MCV 88.1   < > 88.6   < > 92.6 86.8 86.4  PLT 204   < > 203   < > 184 277 141*   < > = values in this interval not displayed.   Lipid Panel: Recent Labs    08/20/22 0000 02/16/23 1419 07/17/23 0955  CHOL 136 168 180  HDL 54 52 63  LDLCALC 69 101* 101*  TRIG 54 60 71  CHOLHDL  --  3.2 2.9   TSH: Recent Labs    08/20/22 0000  TSH 0.47   A1C: Lab Results  Component Value Date   HGBA1C 5.6 10/30/2021     Assessment/Plan 1. Chronic diastolic CHF (congestive heart failure) (HCC) (Primary) Completed 5 days of lasix  PO symptoms improved, will continue routine medication and have her keep follow up with cardiologist.   2. Primary hypertension -encouraged to monitor bp at home, goal bp <140/90 Continue current medications and dietary modifications follow metabolic panel  Tiyana Galla K. [name] BODILY  [facility] [phone]  Virtual Visit via video  I connected with patient on 08/04/23 at  9:20 AM EST by mychart and verified that I am speaking with the correct person using two identifiers.  Location: Patient: home Provider: [facility]   I discussed the limitations, risks, security and privacy concerns of performing an evaluation and management service by telephone and the availability of in person appointments. I also discussed with the patient that there may be a patient responsible charge related to this service. The patient expressed understanding and agreed to proceed.   I discussed the assessment and treatment plan with  the patient. The patient was provided an opportunity to ask questions and all were answered. The patient agreed with the plan and demonstrated an understanding of the instructions.   The patient was advised to call back or seek an in-person evaluation if the symptoms worsen or if the condition fails to improve as anticipated.  I provided 15 minutes of non-face-to-face time during this encounter.  Sharmarke Cicio K. [name] BODILY Avs printed and mailed

## 2023-08-04 NOTE — Telephone Encounter (Signed)
 [name], [name] [name] are scheduled for a virtual visit with your provider today.    Just as we do with appointments in the office, we must obtain your consent to participate.  Your consent will be active for this visit and any virtual visit you [name] [name] have with one of our providers in the next 365 days.    If you have a MyChart account, I can also send a copy of this consent to you electronically.  All virtual visits are billed to your insurance company just like a traditional visit in the office.  As this is a virtual visit, video technology does not allow for your provider to perform a traditional examination.  This [name] [name] limit your provider's ability to fully assess your condition.  If your provider identifies any concerns that need to be evaluated in person or the need to arrange testing such as labs, EKG, etc, we will make arrangements to do so.    Although advances in technology are sophisticated, we cannot ensure that it will always work on either your end or our end.  If the connection with a video visit is poor, we [name] [name] have to switch to a telephone visit.  With either a video or telephone visit, we are not always able to ensure that we have a secure connection.   I need to obtain your verbal consent now.   Are you willing to proceed with your visit today?   [name] [name] has provided verbal consent on 08/04/2023 for a virtual visit (video or telephone).   [name] [name], CMA 08/04/2023  9:19 AM

## 2023-08-18 ENCOUNTER — Ambulatory Visit: Payer: 59 | Attending: Cardiology | Admitting: Cardiology

## 2023-08-18 ENCOUNTER — Encounter: Payer: Self-pay | Admitting: Cardiology

## 2023-08-18 VITALS — BP 142/86 | HR 72 | Resp 16 | Ht 63.0 in | Wt 196.0 lb

## 2023-08-18 DIAGNOSIS — Z6834 Body mass index (BMI) 34.0-34.9, adult: Secondary | ICD-10-CM

## 2023-08-18 DIAGNOSIS — I4819 Other persistent atrial fibrillation: Secondary | ICD-10-CM

## 2023-08-18 DIAGNOSIS — E66811 Obesity, class 1: Secondary | ICD-10-CM

## 2023-08-18 DIAGNOSIS — I5032 Chronic diastolic (congestive) heart failure: Secondary | ICD-10-CM

## 2023-08-18 DIAGNOSIS — E6609 Other obesity due to excess calories: Secondary | ICD-10-CM

## 2023-08-18 DIAGNOSIS — Z7901 Long term (current) use of anticoagulants: Secondary | ICD-10-CM | POA: Diagnosis not present

## 2023-08-18 DIAGNOSIS — E782 Mixed hyperlipidemia: Secondary | ICD-10-CM | POA: Diagnosis not present

## 2023-08-18 NOTE — Progress Notes (Signed)
Cardiology Office Note:  .   Date:  08/18/2023  ID:  [name] [name], DOB 1960/09/20, MRN [id number] PCP:  [name], NP  Former Cardiology Providers: None [facility] Providers Cardiologist:  Tessa Lerner, DO , [facility] (established care 10/28/2021) Electrophysiologist:  None  Click to update primary MD,subspecialty MD or APP then REFRESH:1}    Chief Complaint  Patient presents with   Follow-up    Recent ER visit, heart failure with improved EF    History of Present Illness: .   [name] [name] is a 63 y.o. 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   Patient being followed by the practice given her history of persistent atrial fibrillation, and heart failure with improved EF.  During her prior hospitalization she was in A-fib with RVR underwent direct-current cardioversion which is restored normal sinus rhythm and post cardioversion patient's LVEF has improved from 25-30% to 60-65% with grade 2 diastolic dysfunction.  Since last office visit patient states that she had an visit to the emergency room department due to elevated blood pressures.  She was given Lasix and potassium for 5 days and since then her blood pressures have somewhat improved.  She expresses concerns about the cost of life insurance due to the current medication regimen as well as her chronic comorbid conditions.  Patient is requesting changes to her medications to see if she could potentially get better insurance rates.  I am willing to work with her but she understands clearly that her medical records cannot be changed for this purpose.  Review of Systems: .   Review of Systems  Cardiovascular:  Negative for chest pain,  claudication, irregular heartbeat, leg swelling, near-syncope, orthopnea, palpitations, paroxysmal nocturnal dyspnea and syncope.  Respiratory:  Negative for shortness of breath.   Hematologic/Lymphatic: Negative for bleeding problem.    Studies Reviewed:   EKG: EKG Interpretation Date/Time:  Tuesday August 18 2023 14:32:11 EST Ventricular Rate:  72 PR Interval:  138 QRS Duration:  100 QT Interval:  394 QTC Calculation: 431 R Axis:   76  Text Interpretation: Normal sinus rhythm Nonspecific ST abnormality When compared with ECG of 27-Jul-2023 18:51, No significant change was found Confirmed by Tessa Lerner [phone]) on 08/18/2023 2:49:27 PM  Echocardiogram: 03/19/2021: LVEF 60-65%, moderate LVH, grade 2 diastolic dysfunction, elevated LAP, moderately dilated left atrium, mild MR, mild AR, estimated RAP 15 mmHg.   10/31/2020 TEE + DDCV: LVEF 25-30%, severely reduced, mild LVH, regional wall motion abnormality, RV function low normal, size is normal, no left atrial appendage thrombi, mild to moderate MR, mild plaque in the descending/ascending aorta.  Bubble study negative.  EKG during visit TEE A-fib with RVR.  Underwent direct-current cardioversion 200 J synchronized x1 restored NSR.   11/01/2020:  1. Left ventricular ejection fraction, by estimation, is 50 to 55%. The  left ventricle has low normal function. The left ventricle has no regional  wall motion abnormalities. There is mild left ventricular hypertrophy.  Left ventricular diastolic  parameters are consistent with Grade III diastolic dysfunction  (restrictive). Elevated left atrial pressure.   2. Right ventricular systolic function is normal. The right ventricular  size is normal. There is mildly elevated pulmonary artery systolic  pressure.   3. Left atrial size was mildly dilated.   4.  The mitral valve is grossly normal. Mild mitral valve regurgitation.  No evidence of mitral stenosis.   5. The aortic valve is grossly  normal. Aortic valve regurgitation is  mild. No aortic stenosis is present.   6. The inferior vena cava is normal in size with greater than 50%  respiratory variability, suggesting right atrial pressure of 3 mmHg. 7. While in NSR.     Stress Testing: Regadenoson Nuclear stress test 11/13/2022: Normal myocardial perfusion with mild apical thinning. Overall LV systolic function is normal without regional wall motion abnormalities. Stress LV EF: 59%.  Non-diagnostic ECG stress. The heart rate response was consistent with Regadenoson.  No previous exam available for comparison.  Low risk.   RADIOLOGY: NA  Risk Assessment/Calculations:   Click Here to Calculate/Change CHADS2VASc Score The patient's CHADS2-VASc score is 4, indicating a 4.8% annual risk of stroke. CHF History: Yes HTN History: Yes Diabetes History: No Stroke History: No Vascular Disease History: Yes  Labs:       Latest Ref Rng & Units 07/27/2023    7:20 PM 07/17/2023    9:55 AM 04/23/2023    3:04 AM  CBC  WBC 4.0 - 10.5 K/uL 7.8  7.9  11.5   Hemoglobin 12.0 - 15.0 g/dL 14.7  82.9  9.3   Hematocrit 36.0 - 46.0 % 35.0  37.6  28.9   Platelets 150 - 400 K/uL 141  277  184        Latest Ref Rng & Units 07/27/2023    7:20 PM 07/17/2023    9:55 AM 04/23/2023    3:04 AM  BMP  Glucose 70 - 99 mg/dL 92  86  562   BUN 8 - 23 mg/dL 13  12  10    Creatinine 0.44 - 1.00 mg/dL 1.30  8.65  7.84   BUN/Creat Ratio 6 - 22 (calc)  SEE NOTE:    Sodium 135 - 145 mmol/L 137  139  134   Potassium 3.5 - 5.1 mmol/L 4.2  4.2  4.0   Chloride 98 - 111 mmol/L 105  104  102   CO2 22 - 32 mmol/L 21  24  24    Calcium 8.9 - 10.3 mg/dL 9.0  8.9  8.2       Latest Ref Rng & Units 07/27/2023    7:20 PM 07/17/2023    9:55 AM 04/23/2023    3:04 AM  CMP  Glucose 70 - 99 mg/dL 92  86  696   BUN 8 - 23 mg/dL 13  12  10    Creatinine 0.44 - 1.00 mg/dL 2.95  2.84  1.32   Sodium 135 - 145 mmol/L 137  139  134   Potassium 3.5 - 5.1 mmol/L 4.2  4.2   4.0   Chloride 98 - 111 mmol/L 105  104  102   CO2 22 - 32 mmol/L 21  24  24    Calcium 8.9 - 10.3 mg/dL 9.0  8.9  8.2   Total Protein 6.1 - 8.1 g/dL  7.1    Total Bilirubin 0.2 - 1.2 mg/dL  0.4    AST 10 - 35 U/L  14    ALT 6 - 29 U/L  9      Lab Results  Component Value Date   CHOL 180 07/17/2023   HDL 63 07/17/2023   LDLCALC 101 (H) 07/17/2023   TRIG 71 07/17/2023   CHOLHDL 2.9 07/17/2023   No results for input(s): "LIPOA" in the last 8760  hours. No components found for: "NTPROBNP" No results for input(s): "PROBNP" in the last 8760 hours. Recent Labs    08/20/22 0000  TSH 0.47    Physical Exam:    Today's Vitals   08/18/23 1429  BP: (!) 142/86  Pulse: 72  Resp: 16  SpO2: 95%  Weight: 196 lb (88.9 kg)  Height: 5\' 3"  (1.6 m)   Body mass index is 34.72 kg/m. Wt Readings from Last 3 Encounters:  08/18/23 196 lb (88.9 kg)  07/27/23 195 lb 15.8 oz (88.9 kg)  07/17/23 196 lb (88.9 kg)    Physical Exam  Constitutional: No distress.  Age appropriate, hemodynamically stable.   Neck: No JVD present.  Cardiovascular: Normal rate, regular rhythm, S1 normal, S2 normal, intact distal pulses and normal pulses. Exam reveals no gallop, no S3 and no S4.  No murmur heard. Pulmonary/Chest: Effort normal and breath sounds normal. No stridor. She has no wheezes. She has no rales.  Abdominal: Soft. Bowel sounds are normal. She exhibits no distension. There is no abdominal tenderness.  Musculoskeletal:        General: No edema.     Cervical back: Neck supple.  Neurological: She is alert and oriented to person, place, and time. She has intact cranial nerves (2-12).  Skin: Skin is warm and moist.   Impression & Recommendation(s):  Impression:   ICD-10-CM   1. Heart failure with improved ejection fraction (HFimpEF) (HCC)  I50.32 EKG 12-Lead    2. Persistent atrial fibrillation (HCC)  I48.19     3. Long term (current) use of anticoagulants  Z79.01 Basic metabolic panel     Hemoglobin and hematocrit, blood    Basic metabolic panel    Hemoglobin and hematocrit, blood    4. Mixed hyperlipidemia  E78.2     5. Class 1 obesity due to excess calories with serious comorbidity and body mass index (BMI) of 34.0 to 34.9 in adult  E66.811    E66.09    Z68.34        Recommendation(s):  Heart failure with improved ejection fraction (HFimpEF) (HCC) Stage C, NYHA class II. April 2023: LVEF 25-30%, underwent direct-current cardioversion, and follow-up echocardiogram noted LVEF of 50-55% No reoccurrence of heart failure hospitalization. Continue Entresto 24/26 mg p.o. twice daily. Continue Toprol-XL 25 mg p.o. daily. Was on Farxiga 10 mg p.o. daily, discontinued as she was told by another provider that she is for diabetic patient.  Try to reeducate the patient that it is also used in patients who have heart failure.  However, she would like to hold off. Was not able to tolerate BiDil secondary to headaches/vision changes. Reemphasized the importance of secondary prevention with focus on improving her modifiable cardiovascular risk factors such as glycemic control, lipid management, blood pressure control, weight loss.  Persistent atrial fibrillation (HCC) EKG illustrates sinus rhythm Underwent TEE guided cardioversion in April 2023. Currently on metoprolol for rate control strategy. Continue Eliquis for thromboembolic prophylaxis.  Long term (current) use of anticoagulants Currently on Eliquis for thromboembolic prophylaxis. CHA2DS2-VASc SCORE as noted above. Will check H&H and BMP in 6 months - for surveillance.   Mixed hyperlipidemia Currently on Lipitor 20 mg p.o. daily.   She denies myalgia or other side effects. Most recent lipids dated 06/2023, independently reviewed as noted above.  LDL is 101mg /dL.  Cardiology is following peripherally.  Class 1 obesity due to excess calories with serious comorbidity and body mass index (BMI) of 34.0 to 34.9 in  adult Body mass  index is 34.72 kg/m. I reviewed with her importance of diet, regular physical activity/exercise, weight loss.   Patient is educated on the importance of increasing physical activity gradually as tolerated with a goal of moderate intensity exercise for 30 minutes a day 5 days a week.  EKG today noted subtle ST changes in the inferolateral leads but patient has no chest pain and recent stress test was low risk so we will monitor clinically.   Orders Placed:  Orders Placed This Encounter  Procedures   Basic metabolic panel    Standing Status:   Future    Number of Occurrences:   1    Expected Date:   02/15/2024    Expiration Date:   08/17/2024   Hemoglobin and hematocrit, blood    Standing Status:   Future    Number of Occurrences:   1    Expected Date:   02/15/2024    Expiration Date:   08/17/2024   EKG 12-Lead    Final Medication List:   No orders of the defined types were placed in this encounter.   Medications Discontinued During This Encounter  Medication Reason   potassium chloride SA (KLOR-CON M) 10 MEQ tablet Discontinued by provider   furosemide (LASIX) 40 MG tablet Discontinued by provider     Current Outpatient Medications:    albuterol (PROVENTIL) (2.5 MG/3ML) 0.083% nebulizer solution, Take 3 mLs (2.5 mg total) by nebulization every 6 (six) hours as needed for wheezing or shortness of breath., Disp: 150 mL, Rfl: 6   albuterol (VENTOLIN HFA) 108 (90 Base) MCG/ACT inhaler, Inhale 2 puffs into the lungs every 6 (six) hours as needed for wheezing or shortness of breath., Disp: 8.5 g, Rfl: 11   apixaban (ELIQUIS) 5 MG TABS tablet, Take 1 tablet (5 mg total) by mouth 2 (two) times daily., Disp: 180 tablet, Rfl: 1   atorvastatin (LIPITOR) 20 MG tablet, TAKE 1 TABLET (20 MG TOTAL) BY MOUTH AT BEDTIME FOR CHOLESTEROL, Disp: 90 tablet, Rfl: 1   Bioflavonoid Products (VITAMIN C PLUS PO), Take 1 tablet by mouth daily., Disp: , Rfl:    BREO ELLIPTA 200-25 MCG/ACT AEPB,  Inhale 1 puff into the lungs daily., Disp: 60 each, Rfl: 3   cholecalciferol (VITAMIN D3) 25 MCG (1000 UNIT) tablet, Take 1,000 Units by mouth in the morning., Disp: , Rfl:    metoprolol succinate (TOPROL-XL) 25 MG 24 hr tablet, TAKE 1 TABLET BY MOUTH DAILY. TAKE WITH OR IMMEDIATELY FOLLOWING A MEAL., Disp: 90 tablet, Rfl: 1   OXYGEN, Inhale 2 L/min into the lungs as needed (wheezing/respiratory issues.)., Disp: , Rfl:    sacubitril-valsartan (ENTRESTO) 24-26 MG, Take 1 tablet by mouth 2 (two) times daily., Disp: 180 tablet, Rfl: 1   traZODone (DESYREL) 50 MG tablet, TAKE 1 TABLET BY MOUTH EVERYDAY AT BEDTIME, Disp: 30 tablet, Rfl: 3  Consent:   NA  Disposition:   6 month follow up sooner if needed   Her questions and concerns were addressed to her satisfaction. She voices understanding of the recommendations provided during this encounter.    Signed, Tessa Lerner, DO, [facility]  [facility]  [address] [place], [place] [address] 08/18/2023 6:26 PM

## 2023-08-18 NOTE — Patient Instructions (Signed)
Medication Instructions:  STOP Lasix  STOP Potassium  *If you need a refill on your cardiac medications before your next appointment, please call your pharmacy*   Lab Work in 6 months : BMP H&H   Follow-Up: At [facility], you and your health needs are our priority.  As part of our continuing mission to provide you with exceptional heart care, we have created designated Provider Care Teams.  These Care Teams include your primary Cardiologist (physician) and Advanced Practice Providers (APPs -  Physician Assistants and Nurse Practitioners) who all work together to provide you with the care you need, when you need it.   Your next appointment:   6 month(s)  Provider:   Tessa Lerner, DO     Other Instructions   1st Floor: - Lobby - Registration  - Pharmacy  - Lab - Cafe  2nd Floor: - PV Lab - Diagnostic Testing (echo, CT, nuclear med)  3rd Floor: - Vacant  4th Floor: - TCTS (cardiothoracic surgery) - AFib Clinic - Structural Heart Clinic - Vascular Surgery  - Vascular Ultrasound  5th Floor: - HeartCare Cardiology (general and EP) - Clinical Pharmacy for coumadin, hypertension, lipid, weight-loss medications, and med management appointments    Valet parking services will be available as well.

## 2023-08-20 ENCOUNTER — Telehealth: Payer: Self-pay | Admitting: Cardiology

## 2023-08-20 NOTE — Telephone Encounter (Signed)
Pt called in stating she is having some dental work done and they prescribed her amoxcillion to take prior to. She asked if this was okay to take.

## 2023-08-20 NOTE — Telephone Encounter (Signed)
Spoke with pt over the phone and explained she is okay to take the prescribed abx prior to her dental surgery. All questions were answered.

## 2023-08-26 DIAGNOSIS — J45909 Unspecified asthma, uncomplicated: Secondary | ICD-10-CM | POA: Diagnosis not present

## 2023-08-26 DIAGNOSIS — J9600 Acute respiratory failure, unspecified whether with hypoxia or hypercapnia: Secondary | ICD-10-CM | POA: Diagnosis not present

## 2023-08-27 ENCOUNTER — Telehealth: Payer: Self-pay

## 2023-08-27 ENCOUNTER — Other Ambulatory Visit: Payer: Self-pay | Admitting: Nurse Practitioner

## 2023-08-27 DIAGNOSIS — Z1231 Encounter for screening mammogram for malignant neoplasm of breast: Secondary | ICD-10-CM

## 2023-08-27 MED ORDER — FLUCONAZOLE 150 MG PO TABS: 150.0000 mg | ORAL_TABLET | Freq: Once | ORAL | 0 refills | Status: AC

## 2023-08-27 NOTE — Telephone Encounter (Signed)
 Patient states she should have clarified the itching. She states she is having vaginal itching and wants something sent to the pharmacy

## 2023-08-27 NOTE — Telephone Encounter (Signed)
 Who prescribed the medication, she should call the provider who prescribed the medication

## 2023-08-27 NOTE — Telephone Encounter (Signed)
 Upon further clarification [name] [name] CMA reported the patient is having vaginal itching after taking an antibiotic. Will send Diflucan  to the pharmacy

## 2023-08-27 NOTE — Telephone Encounter (Signed)
 Patient states she is suppose to have surgery on 09/01/2023 and was given amoxicillin  and now having an itching reaction, and was told to call PCP to see if something else could be called.

## 2023-08-28 ENCOUNTER — Telehealth: Payer: Self-pay

## 2023-08-28 NOTE — Telephone Encounter (Signed)
 Called to let patient know medication was sent to pharmacy

## 2023-09-05 ENCOUNTER — Emergency Department (HOSPITAL_COMMUNITY)
Admission: EM | Admit: 2023-09-05 | Discharge: 2023-09-05 | Disposition: A | Discharge status: Home / Self Care | Payer: 59 | Attending: Emergency Medicine | Admitting: Emergency Medicine

## 2023-09-05 ENCOUNTER — Other Ambulatory Visit: Payer: Self-pay

## 2023-09-05 ENCOUNTER — Emergency Department (HOSPITAL_COMMUNITY): Payer: 59

## 2023-09-05 DIAGNOSIS — I728 Aneurysm of other specified arteries: Secondary | ICD-10-CM | POA: Diagnosis not present

## 2023-09-05 DIAGNOSIS — Z20822 Contact with and (suspected) exposure to covid-19: Secondary | ICD-10-CM | POA: Insufficient documentation

## 2023-09-05 DIAGNOSIS — J111 Influenza due to unidentified influenza virus with other respiratory manifestations: Secondary | ICD-10-CM | POA: Diagnosis not present

## 2023-09-05 DIAGNOSIS — J45909 Unspecified asthma, uncomplicated: Secondary | ICD-10-CM | POA: Diagnosis not present

## 2023-09-05 DIAGNOSIS — I10 Essential (primary) hypertension: Secondary | ICD-10-CM | POA: Insufficient documentation

## 2023-09-05 DIAGNOSIS — Z7901 Long term (current) use of anticoagulants: Secondary | ICD-10-CM | POA: Insufficient documentation

## 2023-09-05 DIAGNOSIS — Z7951 Long term (current) use of inhaled steroids: Secondary | ICD-10-CM | POA: Insufficient documentation

## 2023-09-05 DIAGNOSIS — Z79899 Other long term (current) drug therapy: Secondary | ICD-10-CM | POA: Insufficient documentation

## 2023-09-05 DIAGNOSIS — R059 Cough, unspecified: Secondary | ICD-10-CM | POA: Diagnosis not present

## 2023-09-05 DIAGNOSIS — R509 Fever, unspecified: Secondary | ICD-10-CM | POA: Diagnosis present

## 2023-09-05 LAB — CBC WITH DIFFERENTIAL/PLATELET
Abs Immature Granulocytes: 0.02 10*3/uL (ref 0.00–0.07)
Basophils Absolute: 0 10*3/uL (ref 0.0–0.1)
Basophils Relative: 0 %
Eosinophils Absolute: 0 10*3/uL (ref 0.0–0.5)
Eosinophils Relative: 0 %
HCT: 35.9 % — ABNORMAL LOW (ref 36.0–46.0)
Hemoglobin: 11.1 g/dL — ABNORMAL LOW (ref 12.0–15.0)
Immature Granulocytes: 0 %
Lymphocytes Relative: 13 %
Lymphs Abs: 0.7 10*3/uL (ref 0.7–4.0)
MCH: 27.6 pg (ref 26.0–34.0)
MCHC: 30.9 g/dL (ref 30.0–36.0)
MCV: 89.3 fL (ref 80.0–100.0)
Monocytes Absolute: 0.8 10*3/uL (ref 0.1–1.0)
Monocytes Relative: 14 %
Neutro Abs: 4.1 10*3/uL (ref 1.7–7.7)
Neutrophils Relative %: 73 %
Platelets: 184 10*3/uL (ref 150–400)
RBC: 4.02 MIL/uL (ref 3.87–5.11)
RDW: 14.6 % (ref 11.5–15.5)
WBC: 5.7 10*3/uL (ref 4.0–10.5)
nRBC: 0 % (ref 0.0–0.2)

## 2023-09-05 LAB — COMPREHENSIVE METABOLIC PANEL
ALT: 17 U/L (ref 0–44)
AST: 22 U/L (ref 15–41)
Albumin: 4 g/dL (ref 3.5–5.0)
Alkaline Phosphatase: 55 U/L (ref 38–126)
Anion gap: 10 (ref 5–15)
BUN: 9 mg/dL (ref 8–23)
CO2: 24 mmol/L (ref 22–32)
Calcium: 9 mg/dL (ref 8.9–10.3)
Chloride: 101 mmol/L (ref 98–111)
Creatinine, Ser: 0.59 mg/dL (ref 0.44–1.00)
GFR, Estimated: >60 mL/min (ref >60)
Glucose, Bld: 122 mg/dL — ABNORMAL HIGH (ref 70–99)
Potassium: 3.1 mmol/L — ABNORMAL LOW (ref 3.5–5.1)
Sodium: 135 mmol/L (ref 135–145)
Total Bilirubin: 0.7 mg/dL (ref 0.0–1.2)
Total Protein: 7.5 g/dL (ref 6.5–8.1)

## 2023-09-05 LAB — RESP PANEL BY RT-PCR (RSV, FLU A&B, COVID)  RVPGX2
Influenza A by PCR: POSITIVE — AB
Influenza B by PCR: NEGATIVE
Resp Syncytial Virus by PCR: NEGATIVE
SARS Coronavirus 2 by RT PCR: NEGATIVE

## 2023-09-05 MED ORDER — POTASSIUM CHLORIDE 20 MEQ PO PACK
40.0000 meq | PACK | Freq: Once | ORAL | Status: AC
  Administered 2023-09-05: 40 meq via ORAL
  Filled 2023-09-05: qty 2

## 2023-09-05 MED ORDER — ACETAMINOPHEN 500 MG PO TABS
1000.0000 mg | ORAL_TABLET | Freq: Once | ORAL | Status: AC
  Administered 2023-09-05: 1000 mg via ORAL
  Filled 2023-09-05: qty 2

## 2023-09-05 MED ORDER — ONDANSETRON HCL 4 MG PO TABS: 4.0000 mg | ORAL_TABLET | Freq: Three times a day (TID) | ORAL | 0 refills | Status: DC | PRN

## 2023-09-05 MED ORDER — OSELTAMIVIR PHOSPHATE 75 MG PO CAPS: 75.0000 mg | ORAL_CAPSULE | Freq: Two times a day (BID) | ORAL | 0 refills | Status: DC

## 2023-09-05 NOTE — ED Triage Notes (Addendum)
 Patient presents due to chills, cough, fever, headache and generalized pain since last night. Weakness began today. Patient took Tylenol about 40 mins ago and can not take ibuprofen.

## 2023-09-05 NOTE — ED Provider Notes (Signed)
 [place] EMERGENCY DEPARTMENT AT [facility] Provider Note  CSN: [id number] Arrival date & time: 09/05/23 1612  Chief Complaint(s) Cough, Fever, and Headache  HPI [name] [name] is a 63 y.o. female history of CHF, A-fib on Eliquis, asthma, hypertension presenting to the emergency department with cough.  Patient reports last night developing cough, fevers and chills, body aches, sore throat, mild headache, malaise and fatigue.  No productive cough.  No leg swelling.  Patient's daughter and granddaughter have the same symptoms.  No shortness of breath.  No lightheadedness or dizziness.  No fainting.  Reports some nausea vomiting after cough, no abdominal pain   Past Medical History Past Medical History:  Diagnosis Date   Anemia    Anxiety    Arthritis    Asthma    Bloody stool    Chronic diastolic heart failure (HCC)    Dental decay    Dysrhythmia    Atrial flutter   Gastritis, Helicobacter pylori 06/22/2015   Hypertension    Paroxysmal atrial fibrillation (HCC)    Pneumonia 06/21/2015   Prepyloric ulcer 06/22/2015   Seasonal allergies    Sleep apnea    no CPAP   Patient Active Problem List   Diagnosis Date Noted   Primary osteoarthritis of right hip 04/22/2023   S/P total right hip arthroplasty 04/22/2023   Severe sleep apnea 05/06/2022   Nocturnal hypoxia 01/01/2022   Esophageal candidiasis (HCC) 11/04/2021   Bandemia 10/31/2021   Hyponatremia 10/31/2021   Obesity, Class III, BMI 40-49.9 (morbid obesity) (HCC) 10/30/2021   Euthyroid sick syndrome 10/30/2021   Paroxysmal atrial fibrillation (HCC) 10/27/2021   Hypertension    Iron deficiency anemia    Snoring 03/28/2021   Chronic left hip pain 03/12/2021   Chronic left shoulder pain 03/12/2021   Mild intermittent asthma without complication 03/12/2021   Lump of axilla, left 03/12/2021   Anxiety with depression 10/06/2017   Healthcare maintenance 10/06/2017   Osteoarthritis, multiple sites 10/06/2017    Alcohol use disorder 07/06/2015   Prepyloric ulcer 06/22/2015   Elevated blood pressure reading with diagnosis of hypertension 04/14/2015   Seasonal allergies 04/14/2015   Home Medication(s) Prior to Admission medications   Medication Sig Start Date End Date Taking? Authorizing Provider  ondansetron (ZOFRAN) 4 MG tablet Take 1 tablet (4 mg total) by mouth every 8 (eight) hours as needed for nausea or vomiting. 09/05/23  Yes Lonell Grandchild, MD  oseltamivir (TAMIFLU) 75 MG capsule Take 1 capsule (75 mg total) by mouth every 12 (twelve) hours. 09/05/23  Yes Lonell Grandchild, MD  albuterol (PROVENTIL) (2.5 MG/3ML) 0.083% nebulizer solution Take 3 mLs (2.5 mg total) by nebulization every 6 (six) hours as needed for wheezing or shortness of breath. 06/10/23   [name], NP  albuterol (VENTOLIN HFA) 108 (90 Base) MCG/ACT inhaler Inhale 2 puffs into the lungs every 6 (six) hours as needed for wheezing or shortness of breath. 07/17/23   [name], NP  apixaban (ELIQUIS) 5 MG TABS tablet Take 1 tablet (5 mg total) by mouth 2 (two) times daily. 01/14/23   [name], NP  atorvastatin (LIPITOR) 20 MG tablet TAKE 1 TABLET (20 MG TOTAL) BY MOUTH AT BEDTIME FOR CHOLESTEROL 01/14/23   [name], NP  Bioflavonoid Products (VITAMIN C PLUS PO) Take 1 tablet by mouth daily.    [provider]  BREO ELLIPTA 200-25 MCG/ACT AEPB Inhale 1 puff into the lungs daily. 03/30/23   [name],  NP  cholecalciferol (VITAMIN D3) 25 MCG (1000 UNIT) tablet Take 1,000 Units by mouth in the morning.    [provider]  metoprolol succinate (TOPROL-XL) 25 MG 24 hr tablet TAKE 1 TABLET BY MOUTH DAILY. TAKE WITH OR IMMEDIATELY FOLLOWING A MEAL. 02/06/23   [name], NP  OXYGEN Inhale 2 L/min into the lungs as needed (wheezing/respiratory issues.).    [provider]  sacubitril-valsartan (ENTRESTO) 24-26 MG Take 1 tablet by mouth 2 (two) times daily. 03/05/23    [name], NP  traZODone (DESYREL) 50 MG tablet TAKE 1 TABLET BY MOUTH EVERYDAY AT BEDTIME 06/22/23   [name], NP                                                                                                                                    Past Surgical History Past Surgical History:  Procedure Laterality Date   BARTHOLIN CYST MARSUPIALIZATION Left 07/22/1995   BIOPSY  11/04/2021   Procedure: BIOPSY;  Surgeon: Kerin Salen, MD;  Location: [facility];  Service: Gastroenterology;;   BUBBLE STUDY  10/31/2021   Procedure: BUBBLE STUDY;  Surgeon: Tessa Lerner, DO;  Location: [facility];  Service: Cardiovascular;;   CARDIAC SURGERY N/A 07/21/1961   Repair of PFO   CARDIOVERSION N/A 10/31/2021   Procedure: CARDIOVERSION;  Surgeon: Tessa Lerner, DO;  Location: [facility];  Service: Cardiovascular;  Laterality: N/A;   COLONOSCOPY N/A 07/21/2000   Performed secondary to mother's diagnosis of colon CA at age 60   COLONOSCOPY WITH PROPOFOL N/A 11/04/2021   Procedure: COLONOSCOPY WITH PROPOFOL;  Surgeon: Kerin Salen, MD;  Location: [facility];  Service: Gastroenterology;  Laterality: N/A;   ESOPHAGOGASTRODUODENOSCOPY (EGD) WITH PROPOFOL N/A 06/22/2015   Procedure: ESOPHAGOGASTRODUODENOSCOPY (EGD) WITH PROPOFOL;  Surgeon: Willis Modena, MD;  Location: [facility] ENDOSCOPY;  Service: Endoscopy;  Laterality: N/A;   ESOPHAGOGASTRODUODENOSCOPY (EGD) WITH PROPOFOL N/A 11/04/2021   Procedure: ESOPHAGOGASTRODUODENOSCOPY (EGD) WITH PROPOFOL;  Surgeon: Kerin Salen, MD;  Location: [facility];  Service: Gastroenterology;  Laterality: N/A;   FRACTURE SURGERY Left 2005   ORIF tibia   NASAL SINUS SURGERY Bilateral 07/21/1982   OVARIAN CYST REMOVAL Left 07/21/1988   POLYPECTOMY  11/04/2021   Procedure: POLYPECTOMY;  Surgeon: Kerin Salen, MD;  Location: [facility];  Service: Gastroenterology;;   TEE WITHOUT CARDIOVERSION N/A 10/31/2021   Procedure: TRANSESOPHAGEAL ECHOCARDIOGRAM (TEE);   Surgeon: Tessa Lerner, DO;  Location: [facility]OS[facility];  Service: Cardiovascular;  Laterality: N/A;   TOTAL HIP ARTHROPLASTY Right 04/22/2023   Procedure: TOTAL HIP ARTHROPLASTY ANTERIOR APPROACH;  Surgeon: Samson Frederic, MD;  Location: [facility];  Service: Orthopedics;  Laterality: Right;  140   TUBAL LIGATION     UMBILICAL HERNIA REPAIR N/A 07/22/2003   Family History Family History  Problem Relation Age of Onset   Colon cancer Mother 74   Hypertension Mother    Cancer Father 69  liver cancer   Hypertension Father    Cerebral aneurysm Sister    Goiter Sister    Breast cancer Neg Hx     Social History Social History   Tobacco Use   Smoking status: Never   Smokeless tobacco: Never  Vaping Use   Vaping status: Never Used  Substance Use Topics   Alcohol use: Not Currently    Comment: previously 3-4 beers per day, Stopped drinking in 2023   Drug use: No   Allergies Sodium ferric gluconate [ferrous gluconate], Imdur [isosorbide nitrate], and Tetracyclines & related  Review of Systems Review of Systems  All other systems reviewed and are negative.   Physical Exam Vital Signs  I have reviewed the triage vital signs BP (!) 132/107 (BP Location: Right Arm)   Pulse 89   Temp (!) 101 F (38.3 C) (Oral)   Resp 16   SpO2 97%  Physical Exam Vitals and nursing note reviewed.  Constitutional:      General: She is not in acute distress.    Appearance: She is well-developed.  HENT:     Head: Normocephalic and atraumatic.     Mouth/Throat:     Mouth: Mucous membranes are moist.  Eyes:     Pupils: Pupils are equal, round, and reactive to light.  Cardiovascular:     Rate and Rhythm: Normal rate and regular rhythm.     Heart sounds: No murmur heard. Pulmonary:     Effort: Pulmonary effort is normal. No respiratory distress.     Breath sounds: Normal breath sounds.  Abdominal:     General: Abdomen is flat.     Palpations: Abdomen is soft.     Tenderness: There is no  abdominal tenderness.  Musculoskeletal:        General: No tenderness.     Right lower leg: No edema.     Left lower leg: No edema.  Skin:    General: Skin is warm and dry.  Neurological:     General: No focal deficit present.     Mental Status: She is alert. Mental status is at baseline.  Psychiatric:        Mood and Affect: Mood normal.        Behavior: Behavior normal.     ED Results and Treatments Labs (all labs ordered are listed, but only abnormal results are displayed) Labs Reviewed  RESP PANEL BY RT-PCR (RSV, FLU A&B, COVID)  RVPGX2 - Abnormal; Notable for the following components:      Result Value   Influenza A by PCR POSITIVE (*)    All other components within normal limits  COMPREHENSIVE METABOLIC PANEL - Abnormal; Notable for the following components:   Potassium 3.1 (*)    Glucose, Bld 122 (*)    All other components within normal limits  CBC WITH DIFFERENTIAL/PLATELET - Abnormal; Notable for the following components:   Hemoglobin 11.1 (*)    HCT 35.9 (*)    All other components within normal limits  I-STAT CG4 LACTIC ACID, ED  Radiology DG Chest 2 View Result Date: 09/05/2023 CLINICAL DATA:  10031 Cough 10031 EXAM: CHEST - 2 VIEW COMPARISON:  February 26, 2023, October 29, 2021 FINDINGS: The cardiomediastinal silhouette is unchanged in contour.Coarsely calcified mediastinal lymph nodes. No pleural effusion. No pneumothorax. Similar peripheral subpleural density in the RIGHT mid lung which corresponds to a lipoma on prior imaging. No acute pleuroparenchymal abnormality. 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 Multilevel degenerative changes of the thoracic spine. IMPRESSION: 1. No acute cardiopulmonary abnormality. 2. 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 Electronically  Signed   By: Meda Klinefelter M.D.   On: 09/05/2023 16:50    Pertinent labs & imaging results that were available during my care of the patient were reviewed by me and considered in my medical decision making (see MDM for details).  Medications Ordered in ED Medications  potassium chloride (KLOR-CON) packet 40 mEq (has no administration in time range)  acetaminophen (TYLENOL) tablet 1,000 mg (1,000 mg Oral Given 09/05/23 1728)                                                                                                                                     Procedures Procedures  (including critical care time)  Medical Decision Making / ED Course   MDM:  63 year old presenting to the emergency department with weakness, off.  Patient well-appearing, physical examination with no focal findings.  Vitals with fever.   Her flu test is positive, symptoms are consistent with influenza infection.  Began last night.  Given comorbidities we will treat with Tamiflu.  Will also prescribe ondansetron as she reports that she has had some nausea and vomiting with cough.  Chest x-ray clear with no evidence of pneumonia.  Labs reassuring with no significant electrolyte disturbance, mild hypokalemia.  Discussed return precautions. Will discharge patient to home. All questions answered. Patient comfortable with plan of discharge. Return precautions discussed with patient and specified on the after visit summary.       Additional history obtained: -Additional history obtained from family -External records from outside source obtained and reviewed including: Chart review including previous notes, labs, imaging, consultation notes including prior notes    Lab Tests: -I ordered, reviewed, and interpreted labs.   The pertinent results include:   Labs Reviewed  RESP PANEL BY RT-PCR (RSV, FLU A&B, COVID)  RVPGX2 - Abnormal; Notable for the following components:      Result Value   Influenza A by PCR  POSITIVE (*)    All other components within normal limits  COMPREHENSIVE METABOLIC PANEL - Abnormal; Notable for the following components:   Potassium 3.1 (*)    Glucose, Bld 122 (*)    All other components within normal limits  CBC WITH DIFFERENTIAL/PLATELET - Abnormal; Notable for the following components:   Hemoglobin 11.1 (*)    HCT 35.9 (*)    All  other components within normal limits  I-STAT CG4 LACTIC ACID, ED    Notable for +flu, mild hypokalemia    Imaging Studies ordered: I ordered imaging studies including CXR On my interpretation imaging demonstrates no pneumonia I independently visualized and interpreted imaging. I agree with the radiologist interpretation   Medicines ordered and prescription drug management: Meds ordered this encounter  Medications   acetaminophen (TYLENOL) tablet 1,000 mg   oseltamivir (TAMIFLU) 75 MG capsule    Sig: Take 1 capsule (75 mg total) by mouth every 12 (twelve) hours.    Dispense:  10 capsule    Refill:  0   ondansetron (ZOFRAN) 4 MG tablet    Sig: Take 1 tablet (4 mg total) by mouth every 8 (eight) hours as needed for nausea or vomiting.    Dispense:  12 tablet    Refill:  0   potassium chloride (KLOR-CON) packet 40 mEq    -I have reviewed the patients home medicines and have made adjustments as needed   Reevaluation: After the interventions noted above, I reevaluated the patient and found that their symptoms have improved  Co morbidities that complicate the patient evaluation  Past Medical History:  Diagnosis Date   Anemia    Anxiety    Arthritis    Asthma    Bloody stool    Chronic diastolic heart failure (HCC)    Dental decay    Dysrhythmia    Atrial flutter   Gastritis, Helicobacter pylori 06/22/2015   Hypertension    Paroxysmal atrial fibrillation (HCC)    Pneumonia 06/21/2015   Prepyloric ulcer 06/22/2015   Seasonal allergies    Sleep apnea    no CPAP      Dispostion: Disposition decision including  need for hospitalization was considered, and patient discharged from emergency department.    Final Clinical Impression(s) / ED Diagnoses Final diagnoses:  Influenza     This chart was dictated using voice recognition software.  Despite best efforts to proofread,  errors can occur which can change the documentation meaning.    Lonell Grandchild, MD 09/05/23 [phone]

## 2023-09-05 NOTE — Discharge Instructions (Addendum)
 We evaluated you for your cough.  Your flu test is positive.  You can continue to take over-the-counter medications to help with your flu symptoms.  For fever, you can take at 1000 mg of Tylenol total every 6 hours.  Some over-the-counter flu medications also contain Tylenol, so make sure you are taking less than 1000 mg total every 6 hours.  We have prescribed you an antiviral medicine for the flu.  Please take this as prescribed.  We have also prescribed you nausea medicine when she can take every 8 hours as needed for nausea or vomiting.  Please follow-up closely with your primary doctor, if you develop any new or worsening symptoms such as difficulty breathing, lightheadedness or dizziness, fainting, uncontrolled vomiting, or any other symptoms, please return to the emergency department.

## 2023-09-08 ENCOUNTER — Telehealth: Payer: Self-pay | Admitting: *Deleted

## 2023-09-08 NOTE — Telephone Encounter (Signed)
   Pre-operative Risk Assessment    Patient Name: [name] [name]  DOB: 1961-06-21 MRN: [id number]   Date of last office visit: 08/18/23 DR. TOLIA Date of next office visit: NONE  Request for Surgical Clearance    Procedure:  Dental Extraction - Amount of Teeth to be Pulled:  6 TEETH FOR EXTRACTION; LEFT MESSAGE TO CALL BACK IF SIMPLE OR SURGICAL   Date of Surgery:  Clearance TBD                                Surgeon:  NOT LISTED Surgeon's Group or Practice Name:  [facility] Phone number:  [phone] Fax number:  [phone]   Type of Clearance Requested:   - Medical  - Pharmacy:  Hold Apixaban (Eliquis)     Type of Anesthesia:  Local  (LIDOCAINE AND MARCAINE UNDER N20   Additional requests/questions:    Elpidio Anis   09/08/2023, 12:29 PM

## 2023-09-09 ENCOUNTER — Ambulatory Visit: Payer: 59

## 2023-09-10 NOTE — Telephone Encounter (Signed)
   Patient Name: [name] [name]  DOB: Mar 03, 1961 MRN: [id number]  Primary Cardiologist: Tessa Lerner, DO  Chart reviewed as part of pre-operative protocol coverage. Given past medical history and time since last visit, based on ACC/AHA guidelines, CAMESHA FAROOQ is at acceptable risk for the planned procedure without further cardiovascular testing.   Per office protocol, patient can hold Eliquis for 2 days prior to procedure.    The patient was advised that if she develops new symptoms prior to surgery to contact our office to arrange for a follow-up visit, and she verbalized understanding.  I will route this recommendation to the requesting party via Epic fax function and remove from pre-op pool.  Please call with questions.  Joni Reining, NP 09/10/2023, 11:53 AM

## 2023-09-10 NOTE — Telephone Encounter (Signed)
 Patient with diagnosis of afib on Eliquis for anticoagulation.    Procedure: Dental Extraction - Amount of Teeth to be Pulled:  6 TEETH FOR EXTRACTION; LEFT MESSAGE TO CALL BACK IF SIMPLE OR SURGICAL  Date of procedure: TBD   CHA2DS2-VASc Score = 4   This indicates a 4.8% annual risk of stroke. The patient's score is based upon: CHF History: 1 HTN History: 1 Diabetes History: 0 Stroke History: 0 Vascular Disease History: 1 Age Score: 0 Gender Score: 1      CrCl 105 ml/min Platelet count 184  Per office protocol, patient can hold Eliquis for 2 days prior to procedure.    **This guidance is not considered finalized until pre-operative APP has relayed final recommendations.**

## 2023-09-23 DIAGNOSIS — J45909 Unspecified asthma, uncomplicated: Secondary | ICD-10-CM | POA: Diagnosis not present

## 2023-09-23 DIAGNOSIS — J9600 Acute respiratory failure, unspecified whether with hypoxia or hypercapnia: Secondary | ICD-10-CM | POA: Diagnosis not present

## 2023-09-28 DIAGNOSIS — M170 Bilateral primary osteoarthritis of knee: Secondary | ICD-10-CM | POA: Diagnosis not present

## 2023-10-01 ENCOUNTER — Other Ambulatory Visit: Payer: Self-pay | Admitting: Nurse Practitioner

## 2023-10-01 DIAGNOSIS — I480 Paroxysmal atrial fibrillation: Secondary | ICD-10-CM

## 2023-10-04 DIAGNOSIS — I10 Essential (primary) hypertension: Secondary | ICD-10-CM | POA: Diagnosis not present

## 2023-10-04 DIAGNOSIS — I509 Heart failure, unspecified: Secondary | ICD-10-CM | POA: Diagnosis not present

## 2023-10-04 DIAGNOSIS — Z1152 Encounter for screening for COVID-19: Secondary | ICD-10-CM | POA: Diagnosis not present

## 2023-10-04 DIAGNOSIS — R0602 Shortness of breath: Secondary | ICD-10-CM | POA: Diagnosis not present

## 2023-10-04 DIAGNOSIS — J441 Chronic obstructive pulmonary disease with (acute) exacerbation: Secondary | ICD-10-CM | POA: Diagnosis not present

## 2023-10-04 DIAGNOSIS — I110 Hypertensive heart disease with heart failure: Secondary | ICD-10-CM | POA: Diagnosis not present

## 2023-10-06 ENCOUNTER — Ambulatory Visit
Admission: RE | Admit: 2023-10-06 | Discharge: 2023-10-06 | Disposition: A | Discharge status: Home / Self Care | Payer: 59 | Source: Ambulatory Visit | Attending: Nurse Practitioner | Admitting: Nurse Practitioner

## 2023-10-06 DIAGNOSIS — Z1231 Encounter for screening mammogram for malignant neoplasm of breast: Secondary | ICD-10-CM

## 2023-10-13 ENCOUNTER — Ambulatory Visit: Payer: Self-pay

## 2023-10-13 NOTE — Telephone Encounter (Signed)
 Patient has made an appointment to be seen with Venita Sheffield, MD, Patient was advise if she is feeling worse before her appointment tomorrow please call the office first . FYI   Message sent to Mayra Reel, RN

## 2023-10-13 NOTE — Telephone Encounter (Signed)
 Chief Complaint: SOB Symptoms: productive cough, mild SOB, intermittent wheezing Frequency: x 1 month Pertinent Negatives: Patient denies chest pain, swelling, fever Disposition: [] ED /[x] Urgent Care (no appt availability in office) / [] Appointment(In office/virtual)/ []  [facility]/ [] Home Care/ [x] Refused Recommended Disposition /[] [facility]/ []  Follow-up with PCP Additional Notes: Patient seen in ED on 10/04/23 in [place] for COPD exacerbation; she was prescribed azithromycin, albuterol inhaler and prednisone. She states today does not feel as bad as that episode. Patient began breathing treatment as speaking with triage RN, waiting for patient to complete the treatment. Patient states she feels improved after using breathing treatment. Per protocol, disposition recommends see HCP within 4 hours. No appts available today, offered urgent care. Patient refused and is requesting office appt tomorrow with any available provider. Called [facility] and spoke with [facility], awaiting permission from administration if okay to schedule patient. Provided staff with patient's phone number to follow up.   Copied from CRM [id number]. Topic: Clinical - Red Word Triage >> Oct 13, 2023  3:02 PM Brittney F wrote: Red Word that prompted transfer to Nurse Triage: Trouble breathing; taking regular treatments to help Reason for Disposition  [1] MILD difficulty breathing (e.g., minimal/no SOB at rest, SOB with walking, pulse <100) AND [2] NEW-onset or WORSE than normal  Answer Assessment - Initial Assessment Questions 1. RESPIRATORY STATUS: "Describe your breathing?" (e.g., wheezing, shortness of breath, unable to speak, severe coughing)      Shortness of breath, severe coughing, some wheezing.  2. ONSET: "When did this breathing problem begin?"      She states she had the flu in February and states it was severe. She states she had lingering cough and chest congestion after that, treated with  Muccinex. She states beginning of March she was having to use her albuterol more than usual.She was taken to ED on 10/04/23 for difficulty breathing and she states they offered to admit her. She states she refused admission and was told to follow up with PCP. Which she did not schedule.  3. PATTERN "Does the difficult breathing come and go, or has it been constant since it started?"      Constant over the past month; states she feels worse today than the past few days.  4. SEVERITY: "How bad is your breathing?" (e.g., mild, moderate, severe)    - MILD: No SOB at rest, mild SOB with walking, speaks normally in sentences, can lie down, no retractions, pulse < 100.    - MODERATE: SOB at rest, SOB with minimal exertion and prefers to sit, cannot lie down flat, speaks in phrases, mild retractions, audible wheezing, pulse 100-120.    - SEVERE: Very SOB at rest, speaks in single words, struggling to breathe, sitting hunched forward, retractions, pulse > 120      Mild, patient states she feels like its when she is moving around.  5. RECURRENT SYMPTOM: "Have you had difficulty breathing before?" If Yes, ask: "When was the last time?" and "What happened that time?"      Yes, recently was seen in ED.  6. CARDIAC HISTORY: "Do you have any history of heart disease?" (e.g., heart attack, angina, bypass surgery, angioplasty)      CHF.  7. LUNG HISTORY: "Do you have any history of lung disease?"  (e.g., pulmonary embolus, asthma, emphysema)     COPD and asthma.  8. CAUSE: "What do you think is causing the breathing problem?"      COPD flare up, and unsure  if it is related to allergies and the weather.  9. OTHER SYMPTOMS: "Do you have any other symptoms? (e.g., dizziness, runny nose, cough, chest pain, fever)     Productive cough with green mucus.  10. O2 SATURATION MONITOR:  "Do you use an oxygen saturation monitor (pulse oximeter) at home?" If Yes, ask: "What is your reading (oxygen level) today?" "What is  your usual oxygen saturation reading?" (e.g., 95%)       She states she does not know where it is.  11. PREGNANCY: "Is there any chance you are pregnant?" "When was your last menstrual period?"       N/A.  12. TRAVEL: "Have you traveled out of the country in the last month?" (e.g., travel history, exposures)       Denies.  Protocols used: Breathing Difficulty-A-AH

## 2023-10-14 ENCOUNTER — Other Ambulatory Visit (HOSPITAL_COMMUNITY)

## 2023-10-14 ENCOUNTER — Ambulatory Visit: Admitting: Sports Medicine

## 2023-10-14 ENCOUNTER — Ambulatory Visit (HOSPITAL_COMMUNITY)
Admission: EM | Admit: 2023-10-14 | Discharge: 2023-10-14 | Disposition: A | Discharge status: Home / Self Care | Attending: Physician Assistant | Admitting: Physician Assistant

## 2023-10-14 ENCOUNTER — Encounter (HOSPITAL_COMMUNITY): Payer: Self-pay

## 2023-10-14 ENCOUNTER — Ambulatory Visit: Payer: Self-pay | Admitting: Nurse Practitioner

## 2023-10-14 ENCOUNTER — Ambulatory Visit (INDEPENDENT_AMBULATORY_CARE_PROVIDER_SITE_OTHER)

## 2023-10-14 DIAGNOSIS — J441 Chronic obstructive pulmonary disease with (acute) exacerbation: Secondary | ICD-10-CM | POA: Diagnosis not present

## 2023-10-14 DIAGNOSIS — R0602 Shortness of breath: Secondary | ICD-10-CM | POA: Diagnosis not present

## 2023-10-14 DIAGNOSIS — R059 Cough, unspecified: Secondary | ICD-10-CM | POA: Diagnosis not present

## 2023-10-14 HISTORY — DX: Chronic obstructive pulmonary disease, unspecified: J44.9

## 2023-10-14 MED ORDER — PREDNISONE 10 MG (21) PO TBPK: ORAL_TABLET | Freq: Every day | ORAL | 0 refills | Status: DC

## 2023-10-14 MED ORDER — AZITHROMYCIN 250 MG PO TABS: 250.0000 mg | ORAL_TABLET | Freq: Every day | ORAL | 0 refills | Status: DC

## 2023-10-14 MED ORDER — METHYLPREDNISOLONE SODIUM SUCC 125 MG IJ SOLR
60.0000 mg | Freq: Once | INTRAMUSCULAR | Status: AC
  Administered 2023-10-14: 60 mg via INTRAMUSCULAR

## 2023-10-14 MED ORDER — METHYLPREDNISOLONE SODIUM SUCC 125 MG IJ SOLR
INTRAMUSCULAR | Status: AC
Start: 2023-10-14 — End: ?
  Filled 2023-10-14: qty 2

## 2023-10-14 MED ORDER — AMOXICILLIN-POT CLAVULANATE 875-125 MG PO TABS: 1.0000 | ORAL_TABLET | Freq: Two times a day (BID) | ORAL | 0 refills | Status: DC

## 2023-10-14 MED ORDER — IPRATROPIUM-ALBUTEROL 0.5-2.5 (3) MG/3ML IN SOLN
3.0000 mL | Freq: Once | RESPIRATORY_TRACT | Status: AC
  Administered 2023-10-14: 3 mL via RESPIRATORY_TRACT

## 2023-10-14 MED ORDER — IPRATROPIUM-ALBUTEROL 0.5-2.5 (3) MG/3ML IN SOLN
RESPIRATORY_TRACT | Status: AC
  Filled 2023-10-14: qty 3

## 2023-10-14 NOTE — Telephone Encounter (Signed)
 Pt called but declined nurse triage while at urgent care receiving care. Please follow up if possible.

## 2023-10-14 NOTE — ED Notes (Signed)
 Attempted blood draw by Nadara Mode, Rad tech and 2 times by Clinical research associate. Patient declined to have anyone else attempt.

## 2023-10-14 NOTE — ED Triage Notes (Signed)
 Patient reports that she had the flu in February and still having a productive cough and SOB. Patient states she has been using her nebulizer more often and the last time was at 0400 today.  Patient also reports a history of COPD.

## 2023-10-14 NOTE — ED Notes (Signed)
 Patient talking on the phone while she was receiving the breathing treatment.

## 2023-10-14 NOTE — Discharge Instructions (Addendum)
 We are treating you for a COPD exacerbation.  I would like you to follow-up with pulmonology soon as possible.  Call them to schedule an appointment.  Start Augmentin twice daily for 7 days.  Start azithromycin as prescribed.  I will contact you if any of your blood work is abnormal.  You can continue over-the-counter medication including Tylenol and Mucinex.  Make sure you rest and drink plenty of fluid.  Start prednisone taper as prescribed.  Do not take NSAIDs including aspirin, ibuprofen/Advil, naproxen/Aleve.  Follow-up with your primary care as scheduled.  If you have any worsening symptoms including persistent shortness of breath, development of chest pain, worsening cough, weakness you need to go to the emergency room immediately as we discussed.

## 2023-10-14 NOTE — Telephone Encounter (Signed)
 Noted.

## 2023-10-14 NOTE — ED Provider Notes (Signed)
 [facility]    CSN: [id number] Arrival date & time: 10/14/23  0845      History   Chief Complaint Chief Complaint  Patient presents with   Shortness of Breath   Cough    HPI [name] [name] is a 63 y.o. female.   Patient presents today with a 2 to 3-day history of worsening URI symptoms including cough, shortness of breath, wheezing.  She reports that she has had intermittent symptoms since having the flu in February 2025.  She does have a history of COPD and has been compliant with her maintenance medication including Breo Ellipta but has required increased use of albuterol with her last dose 4 AM this morning.  Despite regular use of this medication she continues to have significant symptoms.  She was seen at a different urgent care on 10/04/2023 at which point she was prescribed azithromycin, albuterol, prednisone.  Reports improvement of symptoms but not resolution following this medication.  Denies any additional sick contacts.  She denies any fever, chest pain, nausea, vomiting.  Denies additional antibiotics or steroids in the past 90 days.  Denies history of diabetes.    Past Medical History:  Diagnosis Date   Anemia    Anxiety    Arthritis    Asthma    Bloody stool    Chronic diastolic heart failure (HCC)    COPD (chronic obstructive pulmonary disease) (HCC)    Dental decay    Dysrhythmia    Atrial flutter   Gastritis, Helicobacter pylori 06/22/2015   Hypertension    Paroxysmal atrial fibrillation (HCC)    Pneumonia 06/21/2015   Prepyloric ulcer 06/22/2015   Seasonal allergies    Sleep apnea    no CPAP    Patient Active Problem List   Diagnosis Date Noted   Primary osteoarthritis of right hip 04/22/2023   S/P total right hip arthroplasty 04/22/2023   Severe sleep apnea 05/06/2022   Nocturnal hypoxia 01/01/2022   Esophageal candidiasis (HCC) 11/04/2021   Bandemia 10/31/2021   Hyponatremia 10/31/2021   Obesity, Class III, BMI 40-49.9 (morbid  obesity) (HCC) 10/30/2021   Euthyroid sick syndrome 10/30/2021   Paroxysmal atrial fibrillation (HCC) 10/27/2021   Hypertension    Iron deficiency anemia    Snoring 03/28/2021   Chronic left hip pain 03/12/2021   Chronic left shoulder pain 03/12/2021   Mild intermittent asthma without complication 03/12/2021   Lump of axilla, left 03/12/2021   Anxiety with depression 10/06/2017   Healthcare maintenance 10/06/2017   Osteoarthritis, multiple sites 10/06/2017   Alcohol use disorder 07/06/2015   Prepyloric ulcer 06/22/2015   Elevated blood pressure reading with diagnosis of hypertension 04/14/2015   Seasonal allergies 04/14/2015    Past Surgical History:  Procedure Laterality Date   BARTHOLIN CYST MARSUPIALIZATION Left 07/22/1995   BIOPSY  11/04/2021   Procedure: BIOPSY;  Surgeon: Kerin Salen, MD;  Location: [facility];  Service: Gastroenterology;;   Thressa Sheller STUDY  10/31/2021   Procedure: BUBBLE STUDY;  Surgeon: Tessa Lerner, DO;  Location: [facility];  Service: Cardiovascular;;   CARDIAC SURGERY N/A 07/21/1961   Repair of PFO   CARDIOVERSION N/A 10/31/2021   Procedure: CARDIOVERSION;  Surgeon: Tessa Lerner, DO;  Location: [facility];  Service: Cardiovascular;  Laterality: N/A;   COLONOSCOPY N/A 07/21/2000   Performed secondary to mother's diagnosis of colon CA at age 38   COLONOSCOPY WITH PROPOFOL N/A 11/04/2021   Procedure: COLONOSCOPY WITH PROPOFOL;  Surgeon: Kerin Salen, MD;  Location: [facility];  Service:  Gastroenterology;  Laterality: N/A;   ESOPHAGOGASTRODUODENOSCOPY (EGD) WITH PROPOFOL N/A 06/22/2015   Procedure: ESOPHAGOGASTRODUODENOSCOPY (EGD) WITH PROPOFOL;  Surgeon: Willis Modena, MD;  Location: [facility];  Service: Endoscopy;  Laterality: N/A;   ESOPHAGOGASTRODUODENOSCOPY (EGD) WITH PROPOFOL N/A 11/04/2021   Procedure: ESOPHAGOGASTRODUODENOSCOPY (EGD) WITH PROPOFOL;  Surgeon: Kerin Salen, MD;  Location: [facility];  Service: Gastroenterology;  Laterality:  N/A;   FRACTURE SURGERY Left 2005   ORIF tibia   NASAL SINUS SURGERY Bilateral 07/21/1982   OVARIAN CYST REMOVAL Left 07/21/1988   POLYPECTOMY  11/04/2021   Procedure: POLYPECTOMY;  Surgeon: Kerin Salen, MD;  Location: [facility];  Service: Gastroenterology;;   TEE WITHOUT CARDIOVERSION N/A 10/31/2021   Procedure: TRANSESOPHAGEAL ECHOCARDIOGRAM (TEE);  Surgeon: Tessa Lerner, DO;  Location: [facility];  Service: Cardiovascular;  Laterality: N/A;   TOTAL HIP ARTHROPLASTY Right 04/22/2023   Procedure: TOTAL HIP ARTHROPLASTY ANTERIOR APPROACH;  Surgeon: Samson Frederic, MD;  Location: [facility];  Service: Orthopedics;  Laterality: Right;  140   TUBAL LIGATION     UMBILICAL HERNIA REPAIR N/A 07/22/2003    OB History   No obstetric history on file.      Home Medications    Prior to Admission medications   Medication Sig Start Date End Date Taking? Authorizing Provider  amoxicillin-clavulanate (AUGMENTIN) 875-125 MG tablet Take 1 tablet by mouth every 12 (twelve) hours. 10/14/23  Yes Raliegh Scobie K, PA-C  azithromycin (ZITHROMAX) 250 MG tablet Take 1 tablet (250 mg total) by mouth daily. Take first 2 tablets together, then 1 every day until finished. 10/14/23  Yes Siomara Burkel K, PA-C  predniSONE (STERAPRED UNI-PAK 21 TAB) 10 MG (21) TBPK tablet Take by mouth daily. Take 6 tabs by mouth daily  for 2 days, then 5 tabs for 2 days, then 4 tabs for 2 days, then 3 tabs for 2 days, 2 tabs for 2 days, then 1 tab by mouth daily for 2 days 10/14/23  Yes Ellen Mayol K, PA-C  albuterol (PROVENTIL) (2.5 MG/3ML) 0.083% nebulizer solution Take 3 mLs (2.5 mg total) by nebulization every 6 (six) hours as needed for wheezing or shortness of breath. 06/10/23   [name], NP  albuterol (VENTOLIN HFA) 108 (90 Base) MCG/ACT inhaler Inhale 2 puffs into the lungs every 6 (six) hours as needed for wheezing or shortness of breath. 07/17/23   [name], NP  atorvastatin (LIPITOR) 20 MG tablet TAKE 1  TABLET (20 MG TOTAL) BY MOUTH AT BEDTIME FOR CHOLESTEROL 01/14/23   [name], NP  Bioflavonoid Products (VITAMIN C PLUS PO) Take 1 tablet by mouth daily.    [provider]  BREO ELLIPTA 200-25 MCG/ACT AEPB Inhale 1 puff into the lungs daily. 03/30/23   [name], NP  cholecalciferol (VITAMIN D3) 25 MCG (1000 UNIT) tablet Take 1,000 Units by mouth in the morning.    [provider]  ELIQUIS 5 MG TABS tablet TAKE 1 TABLET BY MOUTH TWICE A DAY 10/01/23   [name], NP  metoprolol succinate (TOPROL-XL) 25 MG 24 hr tablet TAKE 1 TABLET BY MOUTH DAILY. TAKE WITH OR IMMEDIATELY FOLLOWING A MEAL. 02/06/23   [name], NP  OXYGEN Inhale 2 L/min into the lungs as needed (wheezing/respiratory issues.).    [provider]  sacubitril-valsartan (ENTRESTO) 24-26 MG Take 1 tablet by mouth 2 (two) times daily. 03/05/23   [name], NP  traZODone (DESYREL) 50 MG tablet TAKE 1 TABLET BY MOUTH EVERYDAY AT BEDTIME 06/22/23  [name], NP    Family History Family History  Problem Relation Age of Onset   Colon cancer Mother 7   Hypertension Mother    Cancer Father 44       liver cancer   Hypertension Father    Cerebral aneurysm Sister    Goiter Sister    Breast cancer Neg Hx     Social History Social History   Tobacco Use   Smoking status: Never   Smokeless tobacco: Never  Vaping Use   Vaping status: Never Used  Substance Use Topics   Alcohol use: Not Currently    Comment: previously 3-4 beers per day, Stopped drinking in 2023   Drug use: No     Allergies   Sodium ferric gluconate [ferrous gluconate], Imdur [isosorbide nitrate], and Tetracyclines & related   Review of Systems Review of Systems  Constitutional:  Positive for activity change. Negative for appetite change, fatigue and fever.  HENT:  Negative for congestion, sinus pressure, sneezing and sore throat.   Respiratory:  Positive for cough and shortness of  breath.   Cardiovascular:  Negative for chest pain.  Gastrointestinal:  Negative for abdominal pain, diarrhea, nausea and vomiting.  Neurological:  Negative for dizziness, light-headedness and headaches.     Physical Exam Triage Vital Signs ED Triage Vitals  Encounter Vitals Group     BP 10/14/23 0852 110/62     Systolic BP Percentile --      Diastolic BP Percentile --      Pulse Rate 10/14/23 0852 86     Resp 10/14/23 0852 18     Temp 10/14/23 0852 97.9 F (36.6 C)     Temp Source 10/14/23 0852 Oral     SpO2 10/14/23 0852 100 %     Weight --      Height --      Head Circumference --      Peak Flow --      Pain Score 10/14/23 0853 0     Pain Loc --      Pain Education --      Exclude from Growth Chart --    No data found.  Updated Vital Signs BP 110/62 (BP Location: Right Arm)   Pulse 86   Temp 97.9 F (36.6 C) (Oral)   Resp 18   SpO2 100%   Visual Acuity Right Eye Distance:   Left Eye Distance:   Bilateral Distance:    Right Eye Near:   Left Eye Near:    Bilateral Near:     Physical Exam Vitals reviewed.  Constitutional:      General: She is awake. She is not in acute distress.    Appearance: Normal appearance. She is well-developed. She is not ill-appearing.     Comments: Very pleasant female appears stated age in no acute distress sitting comfortably in exam room  HENT:     Head: Normocephalic and atraumatic.     Right Ear: Tympanic membrane, ear canal and external ear normal. Tympanic membrane is not erythematous or bulging.     Left Ear: Tympanic membrane, ear canal and external ear normal. Tympanic membrane is not erythematous or bulging.     Nose: Nose normal.     Mouth/Throat:     Pharynx: Uvula midline. No oropharyngeal exudate, posterior oropharyngeal erythema or postnasal drip.  Cardiovascular:     Rate and Rhythm: Normal rate and regular rhythm.     Heart sounds: Normal heart sounds, S1 normal and S2  normal. No murmur heard. Pulmonary:      Effort: Pulmonary effort is normal. Tachypnea present. No accessory muscle usage or respiratory distress.     Breath sounds: Wheezing present. No rhonchi or rales.     Comments: Wheezing and tachypnea improved following DuoNeb in clinic Psychiatric:        Behavior: Behavior is cooperative.      UC Treatments / Results  Labs (all labs ordered are listed, but only abnormal results are displayed) Labs Reviewed  CBC WITH DIFFERENTIAL/PLATELET  BASIC METABOLIC PANEL    EKG   Radiology No results found.  Procedures Procedures (including critical care time)  Medications Ordered in UC Medications  ipratropium-albuterol (DUONEB) 0.5-2.5 (3) MG/3ML nebulizer solution 3 mL (3 mLs Nebulization Given 10/14/23 0909)  methylPREDNISolone sodium succinate (SOLU-MEDROL) 125 mg/2 mL injection 60 mg (60 mg Intramuscular Given 10/14/23 0906)    Initial Impression / Assessment and Plan / UC Course  I have reviewed the triage vital signs and the nursing notes.  Pertinent labs & imaging results that were available during my care of the patient were reviewed by me and considered in my medical decision making (see chart for details).     Patient is well-appearing, afebrile, nontoxic, nontachycardic.  Patient was given DuoNeb and 60 mg of Solu-Medrol in clinic with improvement of symptoms.  Chest x-ray was obtained that showed perihilar patchy opacity without focal consolidation based on my primary read.  At the time of discharge we were waiting for radiologist over read and we will contact her if this differs and changes our treatment plan.  Will treat for COPD exacerbation but given her recent azithromycin use will cover for CAP with both Augmentin and azithromycin as she is allergic to tetracyclines.  She has plenty of DuoNebs at home and will continue using this every 4-6 hours as needed.  Recommend that she follow-up with pulmonology to determine if additional treatment would be appropriate; she is  established with [facility] and will follow-up with them soon as possible.  She has an appointment with her primary care later today and was encouraged to keep this appointment if she is unable to schedule with pulmonology within the next week or so.  Basic blood work including CBC and CMP were obtained.  If she has significant leukocytosis or abnormal kidney function we will contact her to change treatment plan.  No indication for dose adjustment based on metabolic panel from 10/04/2023 with creatinine of 0.5 and calculated creatinine clearance of 163.73 mL/min.  She can continue over-the-counter over-the-counter medication including Mucinex and Tylenol.  She is to rest and drink plenty of fluid.  We discussed that if her symptoms are not improving within a few days she should follow-up.  Discussed at length that if her symptoms do not improve with current medication regimen and she has worsening symptoms including persistent shortness of breath, chest pain, lightheadedness, weakness she needs to go to the ER immediately.  Strict return precautions given.    Final Clinical Impressions(s) / UC Diagnoses   Final diagnoses:  COPD exacerbation [facility])     Discharge Instructions      We are treating you for a COPD exacerbation.  I would like you to follow-up with pulmonology soon as possible.  Call them to schedule an appointment.  Start Augmentin twice daily for 7 days.  Start azithromycin as prescribed.  I will contact you if any of your blood work is abnormal.  You can continue over-the-counter medication including Tylenol and  Mucinex.  Make sure you rest and drink plenty of fluid.  Start prednisone taper as prescribed.  Do not take NSAIDs including aspirin, ibuprofen/Advil, naproxen/Aleve.  Follow-up with your primary care as scheduled.  If you have any worsening symptoms including persistent shortness of breath, development of chest pain, worsening cough, weakness you need to go to the emergency room  immediately as we discussed.     ED Prescriptions     Medication Sig Dispense Auth. Provider   azithromycin (ZITHROMAX) 250 MG tablet Take 1 tablet (250 mg total) by mouth daily. Take first 2 tablets together, then 1 every day until finished. 6 tablet Azarie Coriz K, PA-C   amoxicillin-clavulanate (AUGMENTIN) 875-125 MG tablet Take 1 tablet by mouth every 12 (twelve) hours. 14 tablet Jakobe Blau K, PA-C   predniSONE (STERAPRED UNI-PAK 21 TAB) 10 MG (21) TBPK tablet Take by mouth daily. Take 6 tabs by mouth daily  for 2 days, then 5 tabs for 2 days, then 4 tabs for 2 days, then 3 tabs for 2 days, 2 tabs for 2 days, then 1 tab by mouth daily for 2 days 42 tablet Michaelina Blandino K, PA-C      PDMP not reviewed this encounter.   Jeani Hawking, PA-C 10/14/23 1022

## 2023-10-14 NOTE — Telephone Encounter (Signed)
 Copied from CRM [id number]. Topic: Clinical - Red Word Triage >> Oct 14, 2023  8:48 AM Irine Seal wrote: [facility] that prompted transfer to Nurse Triage: patient is at urgent care now, being seen, she has an appointment today at [facility]- senior care & adult medicine, she is wheezing and having trouble breathing. While patient was on the phone she asked whether she should go to the ED, advised I was not clinical and could not give medical advise, offered to transfer her to the nurse triage line, she declined stating she was at urgent care being seen now.  Sending to nurse triage for awareness in case follow-up is needed regarding wheezing and shortness of breath. Patient callback [phone]

## 2023-10-16 ENCOUNTER — Encounter: Payer: Self-pay | Admitting: Adult Health

## 2023-10-16 ENCOUNTER — Ambulatory Visit (INDEPENDENT_AMBULATORY_CARE_PROVIDER_SITE_OTHER): Admitting: Adult Health

## 2023-10-16 VITALS — BP 150/80 | HR 90 | Temp 96.6°F | Resp 21 | Ht 63.0 in | Wt 204.4 lb

## 2023-10-16 DIAGNOSIS — F5101 Primary insomnia: Secondary | ICD-10-CM | POA: Diagnosis not present

## 2023-10-16 DIAGNOSIS — I480 Paroxysmal atrial fibrillation: Secondary | ICD-10-CM | POA: Diagnosis not present

## 2023-10-16 DIAGNOSIS — E785 Hyperlipidemia, unspecified: Secondary | ICD-10-CM | POA: Diagnosis not present

## 2023-10-16 DIAGNOSIS — J441 Chronic obstructive pulmonary disease with (acute) exacerbation: Secondary | ICD-10-CM

## 2023-10-16 DIAGNOSIS — I10 Essential (primary) hypertension: Secondary | ICD-10-CM

## 2023-10-16 NOTE — Progress Notes (Signed)
 [facility] clinic  Provider:  Kenard Gower DNP  Code Status:  Full Code  Goals of Care:     10/16/2023   10:50 AM  Advanced Directives  Does Patient Have a Medical Advance Directive? Yes  Type of Estate agent of [place];Living will  Copy of Healthcare Power of Attorney in Chart? No - copy requested     Chief Complaint  Patient presents with   Follow-up    Has concerns about COPD    Discussed the use of AI scribe software for clinical note transcription with the patient, who gave verbal consent to proceed.  HPI: Patient is a 63 y.o. female seen today for an acute visit for COPD concerns. She was seen in the urgent care on March 26,2025 for a COPD exacerbation and treated with Augmentin, Azithromycin and prednisone. She is currently on a tapering dose of prednisone for two weeks and has an upcoming appointment with her pulmonary care doctor on May 8th. She uses oxygen at home at three liters as needed and experiences persistent shortness of breath despite taking a breathing treatment earlier in the day. She uses a nebulizer with Proventil every six hours as needed, as her albuterol inhaler is no longer effective. Her phlegm is clear with no chills or wheezing.  She has a history of hypertension and is currently taking Entresto 24/26 mg daily and metoprolol succinate 25 mg daily. Her blood pressure readings today were 158/100 and 148/100, and she does not monitor her blood pressure at home.  She has atrial fibrillation and is on Eliquis for anticoagulation. She also takes atorvastatin 20 mg at bedtime for hyperlipidemia. She reports her weight is 204 pounds and is working on improving her diet.  She experiences difficulty sleeping, which she attributes to prednisone, and is taking trazodone 50 mg at bedtime for insomnia, though it is not currently effective.   Past Medical History:  Diagnosis Date   Anemia    Anxiety    Arthritis    Asthma    Bloody  stool    Chronic diastolic heart failure (HCC)    COPD (chronic obstructive pulmonary disease) (HCC)    Dental decay    Dysrhythmia    Atrial flutter   Gastritis, Helicobacter pylori 06/22/2015   Hypertension    Paroxysmal atrial fibrillation (HCC)    Pneumonia 06/21/2015   Prepyloric ulcer 06/22/2015   Seasonal allergies    Sleep apnea    no CPAP    Past Surgical History:  Procedure Laterality Date   BARTHOLIN CYST MARSUPIALIZATION Left 07/22/1995   BIOPSY  11/04/2021   Procedure: BIOPSY;  Surgeon: Kerin Salen, MD;  Location: [facility];  Service: Gastroenterology;;   BUBBLE STUDY  10/31/2021   Procedure: BUBBLE STUDY;  Surgeon: Tessa Lerner, DO;  Location: [facility];  Service: Cardiovascular;;   CARDIAC SURGERY N/A 07/21/1961   Repair of PFO   CARDIOVERSION N/A 10/31/2021   Procedure: CARDIOVERSION;  Surgeon: Tessa Lerner, DO;  Location: [facility];  Service: Cardiovascular;  Laterality: N/A;   COLONOSCOPY N/A 07/21/2000   Performed secondary to mother's diagnosis of colon CA at age 36   COLONOSCOPY WITH PROPOFOL N/A 11/04/2021   Procedure: COLONOSCOPY WITH PROPOFOL;  Surgeon: Kerin Salen, MD;  Location: [facility];  Service: Gastroenterology;  Laterality: N/A;   ESOPHAGOGASTRODUODENOSCOPY (EGD) WITH PROPOFOL N/A 06/22/2015   Procedure: ESOPHAGOGASTRODUODENOSCOPY (EGD) WITH PROPOFOL;  Surgeon: Willis Modena, MD;  Location: [facility]COPY;  Service: Endoscopy;  Laterality: N/A;   ESOPHAGOGASTRODUODENOSCOPY (EGD) WITH  PROPOFOL N/A 11/04/2021   Procedure: ESOPHAGOGASTRODUODENOSCOPY (EGD) WITH PROPOFOL;  Surgeon: Kerin Salen, MD;  Location: [facility];  Service: Gastroenterology;  Laterality: N/A;   FRACTURE SURGERY Left 2005   ORIF tibia   NASAL SINUS SURGERY Bilateral 07/21/1982   OVARIAN CYST REMOVAL Left 07/21/1988   POLYPECTOMY  11/04/2021   Procedure: POLYPECTOMY;  Surgeon: Kerin Salen, MD;  Location: [facility];  Service: Gastroenterology;;   TEE WITHOUT  CARDIOVERSION N/A 10/31/2021   Procedure: TRANSESOPHAGEAL ECHOCARDIOGRAM (TEE);  Surgeon: Tessa Lerner, DO;  Location: [facility];  Service: Cardiovascular;  Laterality: N/A;   TOTAL HIP ARTHROPLASTY Right 04/22/2023   Procedure: TOTAL HIP ARTHROPLASTY ANTERIOR APPROACH;  Surgeon: Samson Frederic, MD;  Location: [facility];  Service: Orthopedics;  Laterality: Right;  140   TUBAL LIGATION     UMBILICAL HERNIA REPAIR N/A 07/22/2003    Allergies  Allergen Reactions   Sodium Ferric Gluconate [Ferrous Gluconate] Other (See Comments)    Patient had near syncope with abdominal pain and diaphoresis about an hour after IV ferric gluconate although it was while she was on commode having bowel movements   Imdur [Isosorbide Nitrate]     headache   Tetracyclines & Related Itching    Outpatient Encounter Medications as of 10/16/2023  Medication Sig   albuterol (PROVENTIL) (2.5 MG/3ML) 0.083% nebulizer solution Take 3 mLs (2.5 mg total) by nebulization every 6 (six) hours as needed for wheezing or shortness of breath.   albuterol (VENTOLIN HFA) 108 (90 Base) MCG/ACT inhaler Inhale 2 puffs into the lungs every 6 (six) hours as needed for wheezing or shortness of breath.   amoxicillin-clavulanate (AUGMENTIN) 875-125 MG tablet Take 1 tablet by mouth every 12 (twelve) hours.   atorvastatin (LIPITOR) 20 MG tablet TAKE 1 TABLET (20 MG TOTAL) BY MOUTH AT BEDTIME FOR CHOLESTEROL   azithromycin (ZITHROMAX) 250 MG tablet Take 1 tablet (250 mg total) by mouth daily. Take first 2 tablets together, then 1 every day until finished.   Bioflavonoid Products (VITAMIN C PLUS PO) Take 1 tablet by mouth daily.   BREO ELLIPTA 200-25 MCG/ACT AEPB Inhale 1 puff into the lungs daily.   cholecalciferol (VITAMIN D3) 25 MCG (1000 UNIT) tablet Take 1,000 Units by mouth in the morning.   ELIQUIS 5 MG TABS tablet TAKE 1 TABLET BY MOUTH TWICE A DAY   metoprolol succinate (TOPROL-XL) 25 MG 24 hr tablet TAKE 1 TABLET BY MOUTH DAILY. TAKE  WITH OR IMMEDIATELY FOLLOWING A MEAL.   OXYGEN Inhale 2 L/min into the lungs as needed (wheezing/respiratory issues.).   predniSONE (STERAPRED UNI-PAK 21 TAB) 10 MG (21) TBPK tablet Take by mouth daily. Take 6 tabs by mouth daily  for 2 days, then 5 tabs for 2 days, then 4 tabs for 2 days, then 3 tabs for 2 days, 2 tabs for 2 days, then 1 tab by mouth daily for 2 days   [DISCONTINUED] sacubitril-valsartan (ENTRESTO) 24-26 MG Take 1 tablet by mouth 2 (two) times daily.   [DISCONTINUED] traZODone (DESYREL) 50 MG tablet TAKE 1 TABLET BY MOUTH EVERYDAY AT BEDTIME   No facility-administered encounter medications on file as of 10/16/2023.    Review of Systems:  Review of Systems  Constitutional:  Negative for appetite change, chills, fatigue and fever.  HENT:  Negative for congestion, hearing loss, rhinorrhea and sore throat.   Eyes: Negative.   Respiratory:  Positive for cough. Negative for shortness of breath and wheezing.   Cardiovascular:  Negative for chest pain, palpitations and leg swelling.  Gastrointestinal:  Negative for abdominal pain, constipation, diarrhea, nausea and vomiting.  Genitourinary:  Negative for dysuria.  Musculoskeletal:  Negative for arthralgias, back pain and myalgias.  Skin:  Negative for color change, rash and wound.  Neurological:  Negative for dizziness, weakness and headaches.  Psychiatric/Behavioral:  Negative for behavioral problems. The patient is not nervous/anxious.     Health Maintenance  Topic Date Due   Pneumococcal Vaccine 39-62 Years old (1 of 2 - PCV) Never done   Cervical Cancer Screening (HPV/Pap Cotest)  Never done   Zoster Vaccines- Shingrix (1 of 2) 07/21/2048 (Originally 02/20/2011)   INFLUENZA VACCINE  02/19/2024   Medicare Annual Wellness (AWV)  02/19/2024   DTaP/Tdap/Td (2 - Td or Tdap) 03/26/2025   MAMMOGRAM  10/05/2025   Colonoscopy  11/05/2031   Hepatitis C Screening  Completed   HIV Screening  Completed   HPV VACCINES  Aged Out    COVID-19 Vaccine  Discontinued    Physical Exam: Vitals:   10/16/23 0928 10/16/23 1053 10/16/23 1109  BP: (!) 158/100 (!) 148/100 (!) 150/80  Pulse: 90 90   Resp: (!) 21 (!) 21   Temp: (!) 96.6 F (35.9 C) (!) 96.6 F (35.9 C)   SpO2: 98% 98%   Weight: 204 lb 6.4 oz (92.7 kg) 204 lb 6.4 oz (92.7 kg)   Height: 5\' 3"  (1.6 m) 5\' 3"  (1.6 m)    Body mass index is 36.21 kg/m. Physical Exam Constitutional:      General: She is not in acute distress.    Appearance: She is obese.  HENT:     Head: Normocephalic and atraumatic.     Nose: Nose normal.     Mouth/Throat:     Mouth: Mucous membranes are moist.  Eyes:     Conjunctiva/sclera: Conjunctivae normal.  Cardiovascular:     Rate and Rhythm: Normal rate and regular rhythm.  Pulmonary:     Effort: Pulmonary effort is normal.     Breath sounds: Normal breath sounds.  Abdominal:     General: Bowel sounds are normal.     Palpations: Abdomen is soft.  Musculoskeletal:        General: Normal range of motion.     Cervical back: Normal range of motion.  Skin:    General: Skin is warm and dry.  Neurological:     General: No focal deficit present.     Mental Status: She is alert and oriented to person, place, and time.  Psychiatric:        Mood and Affect: Mood normal.        Behavior: Behavior normal.        Thought Content: Thought content normal.        Judgment: Judgment normal.     Labs reviewed: Basic Metabolic Panel: Recent Labs    07/17/23 0955 07/27/23 1920 09/05/23 1641  NA 139 137 135  K 4.2 4.2 3.1*  CL 104 105 101  CO2 24 21* 24  GLUCOSE 86 92 122*  BUN 12 13 9   CREATININE 0.61 0.53 0.59  CALCIUM 8.9 9.0 9.0   Liver Function Tests: Recent Labs    12/08/22 0000 02/16/23 1419 04/10/23 1039 07/17/23 0955 09/05/23 1641  AST  --    < > 15 14 22   ALT  --    < > 13 9 17   ALKPHOS  --   --  52  --  55  BILITOT  --    < > 0.9 0.4  0.7  PROT  --    < > 7.0 7.1 7.5  ALBUMIN 4.5  --  3.8  --  4.0   < >  = values in this interval not displayed.   No results for input(s): "LIPASE", "AMYLASE" in the last 8760 hours. No results for input(s): "AMMONIA" in the last 8760 hours. CBC: Recent Labs    02/16/23 1419 04/10/23 1039 07/17/23 0955 07/27/23 1920 09/05/23 1641  WBC 5.0   < > 7.9 7.8 5.7  NEUTROABS 2,480  --  4,930  --  4.1  HGB 11.5*   < > 11.9 11.2* 11.1*  HCT 35.6   < > 37.6 35.0* 35.9*  MCV 88.6   < > 86.8 86.4 89.3  PLT 203   < > 277 141* 184   < > = values in this interval not displayed.   Lipid Panel: Recent Labs    02/16/23 1419 07/17/23 0955  CHOL 168 180  HDL 52 63  LDLCALC 101* 101*  TRIG 60 71  CHOLHDL 3.2 2.9   Lab Results  Component Value Date   HGBA1C 5.6 10/30/2021    Procedures since last visit: DG Chest 2 View Result Date: 10/14/2023 CLINICAL DATA:  Shortness of breath, cough. EXAM: CHEST - 2 VIEW COMPARISON:  May 04, 2024. FINDINGS: Stable cardiomediastinal silhouette. No acute pulmonary disease is noted. The visualized skeletal structures are unremarkable. IMPRESSION: No active cardiopulmonary disease. Electronically Signed   By: Lupita Raider M.D.   On: 10/14/2023 10:20   MM 3D SCREENING MAMMOGRAM BILATERAL BREAST Result Date: 10/08/2023 CLINICAL DATA:  Screening. EXAM: DIGITAL SCREENING BILATERAL MAMMOGRAM WITH TOMOSYNTHESIS AND CAD TECHNIQUE: Bilateral screening digital craniocaudal and mediolateral oblique mammograms were obtained. Bilateral screening digital breast tomosynthesis was performed. The images were evaluated with computer-aided detection. COMPARISON:  Previous exam(s). ACR Breast Density Category b: There are scattered areas of fibroglandular density. FINDINGS: There are no findings suspicious for malignancy. IMPRESSION: No mammographic evidence of malignancy. A result letter of this screening mammogram will be mailed directly to the patient. RECOMMENDATION: Screening mammogram in one year. (Code:SM-B-01Y) BI-RADS CATEGORY  1:  Negative. Electronically Signed   By: Elberta Fortis M.D.   On: 10/08/2023 14:03    Assessment/Plan  1. COPD exacerbation (HCC) (Primary) -  Persistent dyspnea and cough with clear sputum. Nebulizer with Proventil used as needed. Prednisone taper ongoing. - Continue Augmentin and prednisone as prescribed. - Use nebulizer with Proventil every 6 hours as needed. - Continue Breo Ellipta, one inhalation daily. - Switch to Mucinex DM liquid for cough management. - Encourage hydration and healthy diet. - Follow up in two weeks after completing prednisone taper.  2. Primary hypertension -  Elevated blood pressure readings. Prednisone and recent illness may contribute. -  continue Metoprolol succinate 25 mg daily and Entresto 24-26 mg BID - Encourage purchase of a home blood pressure monitor. - Advise to monitor blood pressure daily. - Return for evaluation if blood pressure remains elevated in one week.  3. PAF (paroxysmal atrial fibrillation) (HCC) -  Heart rhythm regular on metoprolol and Eliquis. - Continue metoprolol and Eliquis as prescribed.  4. Hyperlipidemia LDL goal <70 -  Elevated LDL cholesterol, on atorvastatin. Working on dietary improvements. - Continue atorvastatin 20 mg at bedtime. - Encourage dietary modifications to improve cholesterol levels.   5. Primary insomnia -  Difficulty sleeping due to prednisone. Trazodone 50 mg at bedtime not effective. - Continue trazodone 50 mg at bedtime. - Reassess sleep quality after  completing prednisone.    Labs/tests ordered:   None   Return in about 2 weeks (around 10/30/2023).  Kenard Gower, NP

## 2023-10-16 NOTE — Progress Notes (Signed)
 This encounter was created in error - please disregard.

## 2023-10-21 ENCOUNTER — Other Ambulatory Visit: Payer: Self-pay | Admitting: Nurse Practitioner

## 2023-10-21 DIAGNOSIS — I5032 Chronic diastolic (congestive) heart failure: Secondary | ICD-10-CM

## 2023-10-21 DIAGNOSIS — F418 Other specified anxiety disorders: Secondary | ICD-10-CM

## 2023-10-24 DIAGNOSIS — J9600 Acute respiratory failure, unspecified whether with hypoxia or hypercapnia: Secondary | ICD-10-CM | POA: Diagnosis not present

## 2023-10-24 DIAGNOSIS — J45909 Unspecified asthma, uncomplicated: Secondary | ICD-10-CM | POA: Diagnosis not present

## 2023-10-26 ENCOUNTER — Emergency Department (HOSPITAL_COMMUNITY)
Admission: EM | Admit: 2023-10-26 | Discharge: 2023-10-26 | Disposition: A | Discharge status: Home / Self Care | Attending: Emergency Medicine | Admitting: Emergency Medicine

## 2023-10-26 ENCOUNTER — Emergency Department (HOSPITAL_COMMUNITY)

## 2023-10-26 ENCOUNTER — Other Ambulatory Visit: Payer: Self-pay

## 2023-10-26 DIAGNOSIS — R140 Abdominal distension (gaseous): Secondary | ICD-10-CM | POA: Diagnosis not present

## 2023-10-26 DIAGNOSIS — Z7952 Long term (current) use of systemic steroids: Secondary | ICD-10-CM | POA: Insufficient documentation

## 2023-10-26 DIAGNOSIS — R059 Cough, unspecified: Secondary | ICD-10-CM | POA: Diagnosis not present

## 2023-10-26 DIAGNOSIS — Z7901 Long term (current) use of anticoagulants: Secondary | ICD-10-CM | POA: Insufficient documentation

## 2023-10-26 DIAGNOSIS — J441 Chronic obstructive pulmonary disease with (acute) exacerbation: Secondary | ICD-10-CM | POA: Insufficient documentation

## 2023-10-26 DIAGNOSIS — Z7951 Long term (current) use of inhaled steroids: Secondary | ICD-10-CM | POA: Insufficient documentation

## 2023-10-26 DIAGNOSIS — R0602 Shortness of breath: Secondary | ICD-10-CM | POA: Diagnosis not present

## 2023-10-26 DIAGNOSIS — I10 Essential (primary) hypertension: Secondary | ICD-10-CM | POA: Diagnosis not present

## 2023-10-26 DIAGNOSIS — I517 Cardiomegaly: Secondary | ICD-10-CM | POA: Diagnosis not present

## 2023-10-26 DIAGNOSIS — Z79899 Other long term (current) drug therapy: Secondary | ICD-10-CM | POA: Diagnosis not present

## 2023-10-26 LAB — CBC WITH DIFFERENTIAL/PLATELET
Abs Immature Granulocytes: 0.03 10*3/uL (ref 0.00–0.07)
Basophils Absolute: 0 10*3/uL (ref 0.0–0.1)
Basophils Relative: 0 %
Eosinophils Absolute: 0.3 10*3/uL (ref 0.0–0.5)
Eosinophils Relative: 3 %
HCT: 34.6 % — ABNORMAL LOW (ref 36.0–46.0)
Hemoglobin: 11.1 g/dL — ABNORMAL LOW (ref 12.0–15.0)
Immature Granulocytes: 0 %
Lymphocytes Relative: 33 %
Lymphs Abs: 2.6 10*3/uL (ref 0.7–4.0)
MCH: 29.1 pg (ref 26.0–34.0)
MCHC: 32.1 g/dL (ref 30.0–36.0)
MCV: 90.8 fL (ref 80.0–100.0)
Monocytes Absolute: 0.6 10*3/uL (ref 0.1–1.0)
Monocytes Relative: 8 %
Neutro Abs: 4.4 10*3/uL (ref 1.7–7.7)
Neutrophils Relative %: 56 %
Platelets: 230 10*3/uL (ref 150–400)
RBC: 3.81 MIL/uL — ABNORMAL LOW (ref 3.87–5.11)
RDW: 14.4 % (ref 11.5–15.5)
WBC: 8 10*3/uL (ref 4.0–10.5)
nRBC: 0 % (ref 0.0–0.2)

## 2023-10-26 LAB — COMPREHENSIVE METABOLIC PANEL WITH GFR
ALT: 15 U/L (ref 0–44)
AST: 19 U/L (ref 15–41)
Albumin: 3.8 g/dL (ref 3.5–5.0)
Alkaline Phosphatase: 59 U/L (ref 38–126)
Anion gap: 12 (ref 5–15)
BUN: 12 mg/dL (ref 8–23)
CO2: 22 mmol/L (ref 22–32)
Calcium: 8.7 mg/dL — ABNORMAL LOW (ref 8.9–10.3)
Chloride: 105 mmol/L (ref 98–111)
Creatinine, Ser: 0.47 mg/dL (ref 0.44–1.00)
GFR, Estimated: >60 mL/min (ref >60)
Glucose, Bld: 98 mg/dL (ref 70–99)
Potassium: 3.4 mmol/L — ABNORMAL LOW (ref 3.5–5.1)
Sodium: 139 mmol/L (ref 135–145)
Total Bilirubin: 0.8 mg/dL (ref 0.0–1.2)
Total Protein: 7.1 g/dL (ref 6.5–8.1)

## 2023-10-26 LAB — URINALYSIS, ROUTINE W REFLEX MICROSCOPIC
Bilirubin Urine: NEGATIVE
Glucose, UA: NEGATIVE mg/dL
Hgb urine dipstick: NEGATIVE
Ketones, ur: NEGATIVE mg/dL
Leukocytes,Ua: NEGATIVE
Nitrite: NEGATIVE
Protein, ur: NEGATIVE mg/dL
Specific Gravity, Urine: 1.01 (ref 1.005–1.030)
pH: 7.5 (ref 5.0–8.0)

## 2023-10-26 LAB — TROPONIN I (HIGH SENSITIVITY): Troponin I (High Sensitivity): 11 ng/L (ref <18)

## 2023-10-26 LAB — RESP PANEL BY RT-PCR (RSV, FLU A&B, COVID)  RVPGX2
Influenza A by PCR: NEGATIVE
Influenza B by PCR: NEGATIVE
Resp Syncytial Virus by PCR: NEGATIVE
SARS Coronavirus 2 by RT PCR: NEGATIVE

## 2023-10-26 LAB — BRAIN NATRIURETIC PEPTIDE: B Natriuretic Peptide: 78.5 pg/mL (ref 0.0–100.0)

## 2023-10-26 MED ORDER — ALBUTEROL SULFATE HFA 108 (90 BASE) MCG/ACT IN AERS
4.0000 | INHALATION_SPRAY | Freq: Once | RESPIRATORY_TRACT | Status: AC
Start: 2023-10-26 — End: 2023-10-26
  Administered 2023-10-26: 4 via RESPIRATORY_TRACT
  Filled 2023-10-26: qty 6.7

## 2023-10-26 MED ORDER — PREDNISONE 20 MG PO TABS: 60.0000 mg | ORAL_TABLET | Freq: Every day | ORAL | 0 refills | Status: DC

## 2023-10-26 MED ORDER — ACETAMINOPHEN 325 MG PO TABS
650.0000 mg | ORAL_TABLET | Freq: Once | ORAL | Status: AC
  Administered 2023-10-26: 650 mg via ORAL
  Filled 2023-10-26: qty 2

## 2023-10-26 MED ORDER — SACUBITRIL-VALSARTAN 24-26 MG PO TABS
1.0000 | ORAL_TABLET | Freq: Once | ORAL | Status: AC
  Administered 2023-10-26: 1 via ORAL
  Filled 2023-10-26: qty 1

## 2023-10-26 MED ORDER — LORATADINE 10 MG PO TABS: 10.0000 mg | ORAL_TABLET | Freq: Every day | ORAL | 0 refills | Status: DC

## 2023-10-26 NOTE — ED Provider Notes (Signed)
 [place] EMERGENCY DEPARTMENT AT [facility] Provider Note   CSN: [id number] Arrival date & time: 10/26/23  0847     History  Chief Complaint  Patient presents with   Shortness of Breath    [name] [name] is a 63 y.o. female.  I patient here with shortness of breath the last few days with nonproductive cough.  History of COPD, atrial fibrillation on Eliquis, hypertension, seasonal allergies.  Just finished a course of antibiotics and steroids about 5 days ago.  Felt better but symptoms got worse again over the weekend.  Denies any chest pain weakness numbness tingling.  No leg swelling.  No fever or sick contacts.  No abdominal pain nausea vomiting diarrhea.  She got albuterol treatment with EMS and Solu-Medrol.  Per EMS patient was wheezing but had normal oxygenation and unremarkable vitals.  Patient does endorse treatment has helped.  The history is provided by the patient and the EMS personnel.       Home Medications Prior to Admission medications   Medication Sig Start Date End Date Taking? Authorizing Provider  loratadine (CLARITIN) 10 MG tablet Take 1 tablet (10 mg total) by mouth daily. 10/26/23 11/25/23 Yes Kinda Pottle, DO  predniSONE (DELTASONE) 20 MG tablet Take 3 tablets (60 mg total) by mouth daily for 4 days. 10/26/23 10/30/23 Yes Nygeria Lager, DO  albuterol (PROVENTIL) (2.5 MG/3ML) 0.083% nebulizer solution Take 3 mLs (2.5 mg total) by nebulization every 6 (six) hours as needed for wheezing or shortness of breath. 06/10/23   [name], NP  albuterol (VENTOLIN HFA) 108 (90 Base) MCG/ACT inhaler Inhale 2 puffs into the lungs every 6 (six) hours as needed for wheezing or shortness of breath. 07/17/23   [name], NP  amoxicillin-clavulanate (AUGMENTIN) 875-125 MG tablet Take 1 tablet by mouth every 12 (twelve) hours. 10/14/23   Raspet, Noberto Retort, PA-C  atorvastatin (LIPITOR) 20 MG tablet TAKE 1 TABLET (20 MG TOTAL) BY MOUTH AT BEDTIME FOR CHOLESTEROL  01/14/23   [name], NP  azithromycin (ZITHROMAX) 250 MG tablet Take 1 tablet (250 mg total) by mouth daily. Take first 2 tablets together, then 1 every day until finished. 10/14/23   Raspet, Noberto Retort, PA-C  Bioflavonoid Products (VITAMIN C PLUS PO) Take 1 tablet by mouth daily.    [provider]  BREO ELLIPTA 200-25 MCG/ACT AEPB Inhale 1 puff into the lungs daily. 03/30/23   [name], NP  cholecalciferol (VITAMIN D3) 25 MCG (1000 UNIT) tablet Take 1,000 Units by mouth in the morning.    [provider]  ELIQUIS 5 MG TABS tablet TAKE 1 TABLET BY MOUTH TWICE A DAY 10/01/23   [name], NP  ENTRESTO 24-26 MG TAKE 1 TABLET BY MOUTH TWICE A DAY 10/21/23   [name], NP  metoprolol succinate (TOPROL-XL) 25 MG 24 hr tablet TAKE 1 TABLET BY MOUTH DAILY. TAKE WITH OR IMMEDIATELY FOLLOWING A MEAL. 02/06/23   [name], NP  OXYGEN Inhale 2 L/min into the lungs as needed (wheezing/respiratory issues.).    [provider]  traZODone (DESYREL) 50 MG tablet TAKE 1 TABLET BY MOUTH EVERYDAY AT BEDTIME 10/21/23   [name], NP      Allergies    Sodium ferric gluconate [ferrous gluconate], Imdur [isosorbide nitrate], and Tetracyclines & related    Review of Systems   Review of Systems  Physical Exam Updated Vital Signs BP (!) 187/100   Pulse 91  Temp 97.7 F (36.5 C) (Oral)   Resp 18   Ht 5\' 3"  (1.6 m)   Wt 92.7 kg   SpO2 98%   BMI 36.20 kg/m  Physical Exam Vitals and nursing note reviewed.  Constitutional:      General: She is not in acute distress.    Appearance: She is well-developed.  HENT:     Head: Normocephalic and atraumatic.  Eyes:     Extraocular Movements: Extraocular movements intact.     Conjunctiva/sclera: Conjunctivae normal.     Pupils: Pupils are equal, round, and reactive to light.  Cardiovascular:     Rate and Rhythm: Normal rate and regular rhythm.     Heart sounds: No murmur heard. Pulmonary:      Effort: No respiratory distress.     Breath sounds: Wheezing present.     Comments: Slightly increased work of breathing with mild end expiratory wheezing Abdominal:     Palpations: Abdomen is soft.     Tenderness: There is no abdominal tenderness.  Musculoskeletal:        General: No swelling. Normal range of motion.     Cervical back: Normal range of motion and neck supple.     Right lower leg: No edema.     Left lower leg: No edema.  Skin:    General: Skin is warm and dry.     Capillary Refill: Capillary refill takes less than 2 seconds.  Neurological:     General: No focal deficit present.     Mental Status: She is alert.  Psychiatric:        Mood and Affect: Mood normal.     ED Results / Procedures / Treatments   Labs (all labs ordered are listed, but only abnormal results are displayed) Labs Reviewed  CBC WITH DIFFERENTIAL/PLATELET - Abnormal; Notable for the following components:      Result Value   RBC 3.81 (*)    Hemoglobin 11.1 (*)    HCT 34.6 (*)    All other components within normal limits  COMPREHENSIVE METABOLIC PANEL WITH GFR - Abnormal; Notable for the following components:   Potassium 3.4 (*)    Calcium 8.7 (*)    All other components within normal limits  RESP PANEL BY RT-PCR (RSV, FLU A&B, COVID)  [id number]  BRAIN NATRIURETIC PEPTIDE  URINALYSIS, ROUTINE W REFLEX MICROSCOPIC  TROPONIN I (HIGH SENSITIVITY)    EKG EKG Interpretation Date/Time:  Monday October 26 2023 09:01:22 EDT Ventricular Rate:  92 PR Interval:  139 QRS Duration:  104 QT Interval:  365 QTC Calculation: 452 R Axis:   92  Text Interpretation: Sinus rhythm Borderline repolarization abnormality Confirmed by Virgina Norfolk [phone]) on 10/26/2023 10:59:10 AM  Radiology DG Chest Portable 1 View Result Date: 10/26/2023 CLINICAL DATA:  63 year old female with increasing cough and shortness of breath. EXAM: PORTABLE CHEST 1 VIEW COMPARISON:  Chest radiographs 10/14/2023 and earlier. FINDINGS:  Portable AP upright view at 0927 hours. Stable mild cardiomegaly, mediastinal contours. Dextroconvex thoracic scoliosis. Visualized tracheal air column is within normal limits. Allowing for portable technique the lungs are clear. No pneumothorax or pleural effusion. Paucity of bowel gas. IMPRESSION: No acute cardiopulmonary abnormality. Electronically Signed   By: Odessa Fleming M.D.   On: 10/26/2023 09:45    Procedures Procedures    Medications Ordered in ED Medications  sacubitril-valsartan (ENTRESTO) 24-26 mg per tablet (1 tablet Oral Given 10/26/23 1016)  acetaminophen (TYLENOL) tablet 650 mg (650 mg Oral Given 10/26/23 0957)  albuterol (  VENTOLIN HFA) 108 (90 Base) MCG/ACT inhaler 4 puff (4 puffs Inhalation Given 10/26/23 1138)    ED Course/ Medical Decision Making/ A&P                                 Medical Decision Making Amount and/or Complexity of Data Reviewed Labs: ordered. Radiology: ordered.  Risk OTC drugs. Prescription drug management.   ALONZO LOVING is here with shortness of breath.  History of hypertension, paroxysmal A-fib on Eliquis, COPD.  Blood pressure is elevated but otherwise unremarkable vitals.  No chest pain.  Differential diagnosis COPD exacerbation versus viral process versus pneumonia versus less likely ACS.  No concern for PE or dissection.  She is anticoagulated.  EKG shows sinus rhythm.  No ischemic changes.  Will obtain CBC CMP troponin BNP chest x-ray COVID flu RSV test.  She just got a breathing treatment and Solu-Medrol with EMS that she is finishing up.  Will reevaluate but she started to feel better with breathing treatment.  Lab work per my review and interpretation shows no significant leukocytosis anemia or electrolyte abnormality or kidney injury.  Urinalysis negative for infection.  Troponin normal.  BNP normal.  Have no concern for ACS or heart failure.  Chest x-ray shows no evidence of pneumonia or volume overload as well.  COVID flu RSV test negative.   Patient is feeling much better following steroids breathing treatment.  I do think this is likely a reactive airway process likely due to weather changes, allergy.  Will prescribe prednisone for couple more days.  Educated about albuterol use.  Recommend Zyrtec or Claritin as well.  Close follow-up with primary care.  Told to return if symptoms worsen.  This chart was dictated using voice recognition software.  Despite best efforts to proofread,  errors can occur which can change the documentation meaning.         Final Clinical Impression(s) / ED Diagnoses Final diagnoses:  COPD exacerbation (HCC)    Rx / DC Orders ED Discharge Orders          Ordered    predniSONE (DELTASONE) 20 MG tablet  Daily        10/26/23 1137    loratadine (CLARITIN) 10 MG tablet  Daily        10/26/23 1137              Zephyr Ridley, Madelaine Bhat, DO 10/26/23 1139

## 2023-10-26 NOTE — ED Triage Notes (Signed)
 Pt c/o worsening Shob since Saturday, non productive cough.

## 2023-10-26 NOTE — Discharge Instructions (Signed)
 Take remaining steroids starting with next dose tomorrow.  Continue inhaler at least 2 to 4 puffs every 4-6 hours as needed for the next 24 hours and then as needed.  Follow-up with your primary care doctor.  Would recommend taking Zyrtec or Claritin.

## 2023-10-30 ENCOUNTER — Ambulatory Visit: Admitting: Nurse Practitioner

## 2023-10-30 ENCOUNTER — Encounter: Payer: Self-pay | Admitting: Nurse Practitioner

## 2023-10-30 VITALS — BP 138/86 | HR 90 | Temp 97.1°F | Ht 63.0 in | Wt 205.2 lb

## 2023-10-30 DIAGNOSIS — I10 Essential (primary) hypertension: Secondary | ICD-10-CM

## 2023-10-30 DIAGNOSIS — J441 Chronic obstructive pulmonary disease with (acute) exacerbation: Secondary | ICD-10-CM

## 2023-10-30 MED ORDER — MONTELUKAST SODIUM 10 MG PO TABS: 10.0000 mg | ORAL_TABLET | Freq: Every day | ORAL | 3 refills | Status: DC

## 2023-10-30 MED ORDER — PREDNISONE 20 MG PO TABS
ORAL_TABLET | ORAL | 0 refills | Status: DC
Start: 2023-10-30 — End: 2023-11-26

## 2023-10-30 NOTE — Progress Notes (Signed)
 Careteam: Patient Care Team: [name], NP as PCP - General (Geriatric Medicine) Tessa Lerner, DO as PCP - Cardiology (Cardiology)  PLACE OF SERVICE:  [facility]  Advanced Directive information    Allergies  Allergen Reactions   Sodium Ferric Gluconate [Ferrous Gluconate] Other (See Comments)    Patient had near syncope with abdominal pain and diaphoresis about an hour after IV ferric gluconate although it was while she was on commode having bowel movements   Imdur [Isosorbide Nitrate]     headache   Tetracyclines & Related Itching    Chief Complaint  Patient presents with   Follow-up    2 week follow-up on COPD    Discussed the use of AI scribe software for clinical note transcription with the patient, who gave verbal consent to proceed.  History of Present Illness   [name] [name] is a 63 year old female with COPD who presents for a two-week follow-up after an emergency room visit. She is accompanied by her daughter.  She presents for follow-up after an emergency room visit on April 7th due to worsening COPD symptoms. She has a history of frequent trips to the ED, including a flu-related ED visits and February and two COPD exacerbations in March. Her symptoms have been severe enough to require emergency care.  She uses Breo, one puff daily, and albuterol as needed. Recently, she has been using albuterol two to three times a day due to increased symptoms, but finds it ineffective, necessitating the use of a nebulizer.  During her recent emergency room visit, her blood pressure was extremely high, and she was started on prednisone, which she has completed. Tests ruled out congestive heart failure and heart attack, with normal BNP and troponin levels. A chest x-ray showed no pneumonia or fluid overload, and tests for COVID, flu, and RSV were negative. She received a steroid treatment and a shot, which improved her condition.  Her breathing is currently better, and she  feels good today, having completed her prednisone course yesterday. No wheezing, indigestion, acid reflux, or fluid retention. She plans to travel to [place] for the weekend for a family event.     Blood pressure has improve off prednisone.   Review of Systems:  Review of Systems  Constitutional:  Negative for chills, fever and weight loss.  HENT:  Negative for tinnitus.   Respiratory:  Negative for cough, sputum production and shortness of breath.   Cardiovascular:  Negative for chest pain, palpitations and leg swelling.  Gastrointestinal:  Negative for abdominal pain, constipation, diarrhea and heartburn.  Genitourinary:  Negative for dysuria, frequency and urgency.  Musculoskeletal:  Negative for back pain, falls, joint pain and myalgias.  Skin: Negative.   Neurological:  Negative for dizziness and headaches.  Psychiatric/Behavioral:  Negative for depression and memory loss. The patient does not have insomnia.     Past Medical History:  Diagnosis Date   Anemia    Anxiety    Arthritis    Asthma    Bloody stool    Chronic diastolic heart failure (HCC)    COPD (chronic obstructive pulmonary disease) (HCC)    Dental decay    Dysrhythmia    Atrial flutter   Gastritis, Helicobacter pylori 06/22/2015   Hypertension    Paroxysmal atrial fibrillation (HCC)    Pneumonia 06/21/2015   Prepyloric ulcer 06/22/2015   Seasonal allergies    Sleep apnea    no CPAP   Past Surgical History:  Procedure Laterality Date  BARTHOLIN CYST MARSUPIALIZATION Left 07/22/1995   BIOPSY  11/04/2021   Procedure: BIOPSY;  Surgeon: Kerin Salen, MD;  Location: [facility]COPY;  Service: Gastroenterology;;   BUBBLE STUDY  10/31/2021   Procedure: BUBBLE STUDY;  Surgeon: Tessa Lerner, DO;  Location: [facility];  Service: Cardiovascular;;   CARDIAC SURGERY N/A 07/21/1961   Repair of PFO   CARDIOVERSION N/A 10/31/2021   Procedure: CARDIOVERSION;  Surgeon: Tessa Lerner, DO;  Location: [facility];   Service: Cardiovascular;  Laterality: N/A;   COLONOSCOPY N/A 07/21/2000   Performed secondary to mother's diagnosis of colon CA at age 12   COLONOSCOPY WITH PROPOFOL N/A 11/04/2021   Procedure: COLONOSCOPY WITH PROPOFOL;  Surgeon: Kerin Salen, MD;  Location: [facility];  Service: Gastroenterology;  Laterality: N/A;   ESOPHAGOGASTRODUODENOSCOPY (EGD) WITH PROPOFOL N/A 06/22/2015   Procedure: ESOPHAGOGASTRODUODENOSCOPY (EGD) WITH PROPOFOL;  Surgeon: Willis Modena, MD;  Location: [facility] ENDOSCOPY;  Service: Endoscopy;  Laterality: N/A;   ESOPHAGOGASTRODUODENOSCOPY (EGD) WITH PROPOFOL N/A 11/04/2021   Procedure: ESOPHAGOGASTRODUODENOSCOPY (EGD) WITH PROPOFOL;  Surgeon: Kerin Salen, MD;  Location: [facility];  Service: Gastroenterology;  Laterality: N/A;   FRACTURE SURGERY Left 2005   ORIF tibia   NASAL SINUS SURGERY Bilateral 07/21/1982   OVARIAN CYST REMOVAL Left 07/21/1988   POLYPECTOMY  11/04/2021   Procedure: POLYPECTOMY;  Surgeon: Kerin Salen, MD;  Location: [facility];  Service: Gastroenterology;;   TEE WITHOUT CARDIOVERSION N/A 10/31/2021   Procedure: TRANSESOPHAGEAL ECHOCARDIOGRAM (TEE);  Surgeon: Tessa Lerner, DO;  Location: [facility];  Service: Cardiovascular;  Laterality: N/A;   TOTAL HIP ARTHROPLASTY Right 04/22/2023   Procedure: TOTAL HIP ARTHROPLASTY ANTERIOR APPROACH;  Surgeon: Samson Frederic, MD;  Location: [facility];  Service: Orthopedics;  Laterality: Right;  140   TUBAL LIGATION     UMBILICAL HERNIA REPAIR N/A 07/22/2003   Social History:   reports that she has never smoked. She has never used smokeless tobacco. She reports that she does not currently use alcohol. She reports that she does not use drugs.  Family History  Problem Relation Age of Onset   Colon cancer Mother 67   Hypertension Mother    Cancer Father 27       liver cancer   Hypertension Father    Cerebral aneurysm Sister    Goiter Sister    Breast cancer Neg Hx     Medications: Patient's  Medications  New Prescriptions   No medications on file  Previous Medications   ALBUTEROL (PROVENTIL) (2.5 MG/3ML) 0.083% NEBULIZER SOLUTION    Take 3 mLs (2.5 mg total) by nebulization every 6 (six) hours as needed for wheezing or shortness of breath.   ALBUTEROL (VENTOLIN HFA) 108 (90 BASE) MCG/ACT INHALER    Inhale 2 puffs into the lungs every 6 (six) hours as needed for wheezing or shortness of breath.   AMOXICILLIN-CLAVULANATE (AUGMENTIN) 875-125 MG TABLET    Take 1 tablet by mouth every 12 (twelve) hours.   ATORVASTATIN (LIPITOR) 20 MG TABLET    TAKE 1 TABLET (20 MG TOTAL) BY MOUTH AT BEDTIME FOR CHOLESTEROL   AZITHROMYCIN (ZITHROMAX) 250 MG TABLET    Take 1 tablet (250 mg total) by mouth daily. Take first 2 tablets together, then 1 every day until finished.   BIOFLAVONOID PRODUCTS (VITAMIN C PLUS PO)    Take 1 tablet by mouth daily.   BREO ELLIPTA 200-25 MCG/ACT AEPB    Inhale 1 puff into the lungs daily.   CHOLECALCIFEROL (VITAMIN D3) 25 MCG (1000 UNIT) TABLET  Take 1,000 Units by mouth in the morning.   ELIQUIS 5 MG TABS TABLET    TAKE 1 TABLET BY MOUTH TWICE A DAY   ENTRESTO 24-26 MG    TAKE 1 TABLET BY MOUTH TWICE A DAY   LORATADINE (CLARITIN) 10 MG TABLET    Take 1 tablet (10 mg total) by mouth daily.   METOPROLOL SUCCINATE (TOPROL-XL) 25 MG 24 HR TABLET    TAKE 1 TABLET BY MOUTH DAILY. TAKE WITH OR IMMEDIATELY FOLLOWING A MEAL.   OXYGEN    Inhale 2 L/min into the lungs as needed (wheezing/respiratory issues.).   PREDNISONE (DELTASONE) 20 MG TABLET    Take 3 tablets (60 mg total) by mouth daily for 4 days.   TRAZODONE (DESYREL) 50 MG TABLET    TAKE 1 TABLET BY MOUTH EVERYDAY AT BEDTIME  Modified Medications   No medications on file  Discontinued Medications   No medications on file    Physical Exam:  Vitals:   10/30/23 1300 10/30/23 1302  BP: (!) 140/90 138/86  Pulse: 90   Temp: (!) 97.1 F (36.2 C)   SpO2: 98%   Weight: 205 lb 3.2 oz (93.1 kg)   Height: 5\' 3"  (1.6 m)     Body mass index is 36.35 kg/m. Wt Readings from Last 3 Encounters:  10/30/23 205 lb 3.2 oz (93.1 kg)  10/26/23 204 lb 5.9 oz (92.7 kg)  10/16/23 204 lb 6.4 oz (92.7 kg)    Physical Exam Constitutional:      General: She is not in acute distress.    Appearance: She is well-developed. She is not diaphoretic.  HENT:     Head: Normocephalic and atraumatic.     Mouth/Throat:     Pharynx: No oropharyngeal exudate.  Eyes:     Conjunctiva/sclera: Conjunctivae normal.     Pupils: Pupils are equal, round, and reactive to light.  Cardiovascular:     Rate and Rhythm: Normal rate and regular rhythm.     Heart sounds: Normal heart sounds.  Pulmonary:     Effort: Pulmonary effort is normal.     Breath sounds: Normal breath sounds.  Abdominal:     General: Bowel sounds are normal.     Palpations: Abdomen is soft.  Musculoskeletal:     Cervical back: Normal range of motion and neck supple.     Right lower leg: No edema.     Left lower leg: No edema.  Skin:    General: Skin is warm and dry.  Neurological:     Mental Status: She is alert.  Psychiatric:        Mood and Affect: Mood normal.     Labs reviewed: Basic Metabolic Panel: Recent Labs    07/27/23 1920 09/05/23 1641 10/26/23 0904  NA 137 135 139  K 4.2 3.1* 3.4*  CL 105 101 105  CO2 21* 24 22  GLUCOSE 92 122* 98  BUN 13 9 12   CREATININE 0.53 0.59 0.47  CALCIUM 9.0 9.0 8.7*   Liver Function Tests: Recent Labs    04/10/23 1039 07/17/23 0955 09/05/23 1641 10/26/23 0904  AST 15 14 22 19   ALT 13 9 17 15   ALKPHOS 52  --  55 59  BILITOT 0.9 0.4 0.7 0.8  PROT 7.0 7.1 7.5 7.1  ALBUMIN 3.8  --  4.0 3.8   No results for input(s): "LIPASE", "AMYLASE" in the last 8760 hours. No results for input(s): "AMMONIA" in the last 8760 hours. CBC: Recent Labs  07/17/23 0955 07/27/23 1920 09/05/23 1641 10/26/23 0904  WBC 7.9 7.8 5.7 8.0  NEUTROABS 4,930  --  4.1 4.4  HGB 11.9 11.2* 11.1* 11.1*  HCT 37.6 35.0*  35.9* 34.6*  MCV 86.8 86.4 89.3 90.8  PLT 277 141* 184 230   Lipid Panel: Recent Labs    02/16/23 1419 07/17/23 0955  CHOL 168 180  HDL 52 63  LDLCALC 101* 101*  TRIG 60 71  CHOLHDL 3.2 2.9   TSH: No results for input(s): "TSH" in the last 8760 hours. A1C: Lab Results  Component Value Date   HGBA1C 5.6 10/30/2021     Assessment/Plan Assessment and Plan    Chronic Obstructive Pulmonary Disease (COPD) exacerbation Multiple recent exacerbations likely due to reactive airway disease, possibly pollen-related. Completed prednisone and antibiotics without improvement. Current medications include Breo and loratadine. Singulair recommended to reduce histamine response. Informed about long-term steroid risks. - Continue Breo one puff daily. - Start Singulair (montelukast) 10 mg daily. - Use albuterol nebulizer as needed for dyspnea. - Pick up Breo from pharmacy; pharmacist to review inhaler technique. - Prescribe prednisone as reserve for exacerbation while out of town: 3 tablets for 2 days, then 2 tablets for 3 days, if symptoms worsen. - Continue loratadine. - Follow up with pulmonary appointment on May 8th.  Hypertension Extremely high blood pressure noted during recent ER visit, likely exacerbated by steroid use. Informed about potential side effects of long-term steroid use. - Monitor blood pressure, especially post-steroid use. - Discussed potential side effects of long-term steroid use.       Keep follow up as scheduled.   [name] [name]. [name] [phone]

## 2023-10-30 NOTE — Patient Instructions (Addendum)
 Prednisone sent to the pharmacy if symptoms start to worsen over the next few days  If you take prednisone we need to follow up again- either with me or pulmonary.   Use albuterol neb as needed Make sure to take breo every day START - montelukast (SINGULAIR) 10 MG tablet; Take 1 tablet (10 mg total) by mouth at bedtime.

## 2023-11-18 ENCOUNTER — Telehealth: Payer: Self-pay | Admitting: Nurse Practitioner

## 2023-11-18 NOTE — Telephone Encounter (Signed)
 Patient walked in to drop off financial aid form and requested handi cap placard to be filled out by [employer]. It is needed back by May 5.

## 2023-11-18 NOTE — Telephone Encounter (Signed)
 I have paper and filled out as much as I could and placed into urgent/time sensitive folder. Message routed to PCP Roselie Conger, Champ Coma, NP as Arlie Lain.

## 2023-11-23 DIAGNOSIS — J45909 Unspecified asthma, uncomplicated: Secondary | ICD-10-CM | POA: Diagnosis not present

## 2023-11-23 DIAGNOSIS — J9600 Acute respiratory failure, unspecified whether with hypoxia or hypercapnia: Secondary | ICD-10-CM | POA: Diagnosis not present

## 2023-11-24 ENCOUNTER — Other Ambulatory Visit: Payer: Self-pay | Admitting: Nurse Practitioner

## 2023-11-24 NOTE — Telephone Encounter (Signed)
 The patient's financial aid paperwork was scanned and emailed yesterday. I advised the patient still waiting on handicap placard paperwork likely to be ready for pickup on Friday.

## 2023-11-24 NOTE — Telephone Encounter (Addendum)
 Attn: [name] [name] is calling   Please call [name] [name]  regarding  the status of her financial aid paperwork and handicap placard paperwork,

## 2023-11-25 NOTE — Progress Notes (Unsigned)
 @Patient  ID: [name] [name], female    DOB: [birth year], 62 y.o.   MRN: [id number]  No chief complaint on file.   Referring provider: Verma Gobble, NP  HPI: 63 year old female, never smoked.  Past medical history significant for mild intermittent asthma, allergies, acute respiratory failure, elevated blood pressure readings, osteoarthritis, alcohol  use disorder, anxiety/depression.   Previous [facility] pulmonary encounter: 03/28/2021 Patient presents today for hospital follow-up.  She was admitted from 03/18/2021-03/25/2021 for acute on chronic hypercarbic respiratory failure, acute on chronic diastolic heart failure.  Patient was consulted by Dr. Washington Hacker with PCCM inpatient.  Patient presented to emergency room shortness of breath/wheezing.  COVID test was negative.  Oxygen saturation was 89% on room air.  She received albuterol  nebulized treatments and IV Solu-Medrol .  It was recommended that she restart her Symbicort  for mild intermittent asthma.  BiPAP at bedtime, patient likely has undiagnosed obesity hypoventilation syndrome and possibly OSA.  She was discharged on 2 L of oxygen and prednisone  taper.  Need outpatient sleep study.  She is doing well today. She has had not episodes of shortness of breath, wheezing or chest tightness. She is using Breo 200mcg daily as prescribed. Prior to hospitalization she was not sleeping well. She is currently taking trazodone  prescribed buy her PCP which has been helping.   Sleep questionnaire Symptoms- Snoring, restless sleep Prior sleep study- None Bedtime-9-10pm Out of bed in morning- 6am Nocturnal awakenings- once  Epworth- 7   May 12th 2023- Dr. Diania Fortes  [name] [name] is a 63 year old woman, never smoker with history of asthma, alcohol  abuse, atrial fibrillation, hypertension and HFpEF who was admitted at [facility] 4/9 to 4/18 for asthma exacerbation and atrial fibrillation with RVR. She returns to pulmonary clinic for follow up.   She was treated with  steroid taper and ICS/LAMA/LABA nebulizer treatments while in the hospital. She had TEE cardiversion for the a fib with RVR on 10/31/21 and new LVEF of 25-30%. She had EGD on 4/17 esophageal plaques (biopsied), 2 cm hiatal hernia and erythematous mucosa in the antrum (biopsied) and normal duodenum (biopsied).  Colonoscopy with normal examined ileum, colon polyps and nonbleeding internal hemorrhoid.  She was discharged with Breo ellipta  200 inhaler 1 puff every other day. She is using albuterol  inhaler about 4 times per week. She is using oxygen 2L at night.   She has stress test on Monday with cardiology.  She reports doing well since her hospitalization. She is rarely drinking alcohol  now. She denies cough, wheezing or dyspnea. She denies night time awakenings.   05/06/2022 Patient presents today for overdue follow-up. Hx asthma, afib, HF. Admitted in September for acute on chronic diastolic heart failure, COPD exacerbation and acute on chronic respiratory failure. Discharged on prednisone  taper. Maintained on Breo 200mcg and Torsemide . She is doing well, feeling better. She is here to go over sleep study results from 01/01/22 that showed severe OSA, AHI 63/hour. We discussed treatment options, recommending she be started on CPAP d/t severity of OSA and cardiac history. She is open to starting treatment.    11/26/2023- Interim hx  Discussed the use of AI scribe software for clinical note transcription with the patient, who gave verbal consent to proceed.  History of Present Illness   [name] [name] is a 63 year old female with mild intermittent asthma who presents for follow-up of her respiratory symptoms.  She has experienced significant improvement in her asthma symptoms since her last visit. She is currently using Breo 200  micrograms, one puff daily, and has not needed her rescue inhaler in the past week. She had exacerbations in February, March, and April, which required emergency room visits and  urgent care. During these episodes, she used her albuterol  inhaler and nebulizer without sufficient relief, leading to treatment with IV steroids and prednisone . Her symptoms have improved significantly since then, and she attributes some of the exacerbations to flu and pollen exposure.  She was diagnosed with severe obstructive sleep apnea in 2023 and was recommended CPAP therapy, which she used for about a month before discontinuing due to discomfort. She reports no current issues with sleep and has lost 30 pounds since her sleep study, which she believes has contributed to her improved sleep quality. No waking up gasping or choking, and no snoring, as confirmed by her daughter.  She has a history of alcohol  use but reports cessation since September 2023. She has not consumed alcohol  since then and attributes this change to personal decisions and lifestyle modifications.      Allergies  Allergen Reactions   Sodium Ferric Gluconate [Ferrous Gluconate] Other (See Comments)    Patient had near syncope with abdominal pain and diaphoresis about an hour after IV ferric gluconate although it was while she was on commode having bowel movements   Imdur [Isosorbide Nitrate]     headache   Tetracyclines & Related Itching    Immunization History  Administered Date(s) Administered   PPD Test 03/12/2015   Tdap 03/27/2015    Past Medical History:  Diagnosis Date   Anemia    Anxiety    Arthritis    Asthma    Bloody stool    Chronic diastolic heart failure (HCC)    COPD (chronic obstructive pulmonary disease) (HCC)    Dental decay    Dysrhythmia    Atrial flutter   Gastritis, Helicobacter pylori 06/22/2015   Hypertension    Paroxysmal atrial fibrillation (HCC)    Pneumonia 06/21/2015   Prepyloric ulcer 06/22/2015   Seasonal allergies    Sleep apnea    no CPAP    Tobacco History: Social History   Tobacco Use  Smoking Status Never  Smokeless Tobacco Never   Counseling given: Not  Answered   Outpatient Medications Prior to Visit  Medication Sig Dispense Refill   albuterol  (PROVENTIL ) (2.5 MG/3ML) 0.083% nebulizer solution Take 3 mLs (2.5 mg total) by nebulization every 6 (six) hours as needed for wheezing or shortness of breath. 150 mL 6   albuterol  (VENTOLIN  HFA) 108 (90 Base) MCG/ACT inhaler Inhale 2 puffs into the lungs every 6 (six) hours as needed for wheezing or shortness of breath. 8.5 g 11   atorvastatin  (LIPITOR) 20 MG tablet TAKE 1 TABLET (20 MG TOTAL) BY MOUTH AT BEDTIME FOR CHOLESTEROL 90 tablet 1   Bioflavonoid Products (VITAMIN C PLUS PO) Take 1 tablet by mouth daily.     BREO ELLIPTA  200-25 MCG/ACT AEPB TAKE 1 PUFF BY MOUTH EVERY DAY 60 each 2   cholecalciferol  (VITAMIN D3) 25 MCG (1000 UNIT) tablet Take 1,000 Units by mouth in the morning.     ELIQUIS  5 MG TABS tablet TAKE 1 TABLET BY MOUTH TWICE A DAY 180 tablet 1   ENTRESTO  24-26 MG TAKE 1 TABLET BY MOUTH TWICE A DAY 180 tablet 1   loratadine  (CLARITIN ) 10 MG tablet Take 1 tablet (10 mg total) by mouth daily. 30 tablet 0   metoprolol  succinate (TOPROL -XL) 25 MG 24 hr tablet TAKE 1 TABLET BY MOUTH DAILY. TAKE  WITH OR IMMEDIATELY FOLLOWING A MEAL. 90 tablet 1   montelukast  (SINGULAIR ) 10 MG tablet Take 1 tablet (10 mg total) by mouth at bedtime. 30 tablet 3   OXYGEN Inhale 2 L/min into the lungs as needed (wheezing/respiratory issues.).     predniSONE  (DELTASONE ) 20 MG tablet Take 3 tablets daily for 2 days then 2 tablets for 3 days 15 tablet 0   traZODone  (DESYREL ) 50 MG tablet TAKE 1 TABLET BY MOUTH EVERYDAY AT BEDTIME 30 tablet 3   No facility-administered medications prior to visit.   Review of Systems  Review of Systems  Constitutional: Negative.  Negative for fatigue.  HENT: Negative.    Respiratory: Negative.  Negative for cough, shortness of breath and wheezing.   Cardiovascular: Negative.   Psychiatric/Behavioral:  Negative for sleep disturbance.      Physical Exam  There were no  vitals taken for this visit. Physical Exam Constitutional:      General: She is not in acute distress.    Appearance: Normal appearance. She is obese. She is not ill-appearing.  HENT:     Head: Normocephalic and atraumatic.  Cardiovascular:     Rate and Rhythm: Normal rate and regular rhythm.  Pulmonary:     Effort: Pulmonary effort is normal.     Breath sounds: Normal breath sounds. No wheezing.     Comments: CTA Skin:    General: Skin is warm and dry.  Neurological:     General: No focal deficit present.     Mental Status: She is alert and oriented to person, place, and time. Mental status is at baseline.  Psychiatric:        Mood and Affect: Mood normal.        Behavior: Behavior normal.        Thought Content: Thought content normal.        Judgment: Judgment normal.      Lab Results:  CBC    Component Value Date/Time   WBC 8.0 10/26/2023 0904   RBC 3.81 (L) 10/26/2023 0904   HGB 11.1 (L) 10/26/2023 0904   HGB 13.1 08/22/2021 1143   HCT 34.6 (L) 10/26/2023 0904   HCT 39.8 08/22/2021 1143   PLT 230 10/26/2023 0904   PLT 285 08/22/2021 1143   MCV 90.8 10/26/2023 0904   MCV 87 08/22/2021 1143   MCH 29.1 10/26/2023 0904   MCHC 32.1 10/26/2023 0904   RDW 14.4 10/26/2023 0904   RDW 14.9 08/22/2021 1143   LYMPHSABS 2.6 10/26/2023 0904   LYMPHSABS 2.1 08/22/2021 1143   MONOABS 0.6 10/26/2023 0904   EOSABS 0.3 10/26/2023 0904   EOSABS 0.1 08/22/2021 1143   BASOSABS 0.0 10/26/2023 0904   BASOSABS 0.0 08/22/2021 1143    BMET    Component Value Date/Time   NA 139 10/26/2023 0904   NA 142 12/08/2022 0000   K 3.4 (L) 10/26/2023 0904   CL 105 10/26/2023 0904   CO2 22 10/26/2023 0904   GLUCOSE 98 10/26/2023 0904   BUN 12 10/26/2023 0904   BUN 11 12/08/2022 0000   CREATININE 0.47 10/26/2023 0904   CREATININE 0.61 07/17/2023 0955   CALCIUM  8.7 (L) 10/26/2023 0904   GFRNONAA >60 10/26/2023 0904   GFRAA >60 10/28/2019 1227    BNP    Component Value Date/Time    BNP 78.5 10/26/2023 0904    ProBNP    Component Value Date/Time   PROBNP 59.0 03/14/2010 0710    Imaging: No results found.   Assessment &  Plan:   1. Mild intermittent asthma without complication (Primary) - Pulmonary Function Test; Future  2. Severe sleep apnea  Assessment and Plan    Mild Intermittent Asthma Mild intermittent asthma with exacerbations in February, March, and April, necessitating emergency room and urgent care visits. Symptoms have improved significantly with the current treatment regimen. Recent exacerbations likely due to influenza and pollen exposure. Currently well-managed with no recent need for rescue inhaler. - Continue Breo Ellipta  200 mcg one puff daily and montelukast  10mg  at bedtime. - Use albuterol  inhaler as needed for rescue, every 4-6 hours. - Order pulmonary function test for baseline assessment at next follow-up. - Advise to contact clinic if respiratory symptoms worsen, including cough, dyspnea, chest tightness, or wheezing.  Severe Obstructive Sleep Apnea Severe obstructive sleep apnea diagnosed in 2023. She reports significant weight loss and cessation of CPAP use, with no current symptoms. Declines repeat sleep study and CPAP use. Weight loss likely contributed to symptom improvement. - Set weight loss goal to 185 pounds. - Advise sleeping on side or using a wedge pillow to elevate head. - Discuss potential use of weight loss medication Zepbound (tirzepatide) with primary care physician if needed. - Advise against alcohol  consumption before bed and driving if fatigued.  Atrial Fibrillation Continue Eliquis  as directed   Follow-up Follow-up plan discussed for ongoing management of asthma and potential weight management strategies. - Schedule follow-up in 6 months with pulmonary function test prior to visit.   Antonio Baumgarten, NP 11/25/2023

## 2023-11-26 ENCOUNTER — Ambulatory Visit: Admitting: Primary Care

## 2023-11-26 ENCOUNTER — Encounter: Admitting: Orthopedic Surgery

## 2023-11-26 ENCOUNTER — Encounter: Payer: Self-pay | Admitting: Primary Care

## 2023-11-26 ENCOUNTER — Ambulatory Visit: Payer: Self-pay | Admitting: Primary Care

## 2023-11-26 ENCOUNTER — Telehealth: Payer: Self-pay | Admitting: Primary Care

## 2023-11-26 ENCOUNTER — Ambulatory Visit: Payer: Self-pay

## 2023-11-26 VITALS — BP 150/91 | HR 65 | Temp 97.6°F | Ht 63.0 in | Wt 205.8 lb

## 2023-11-26 DIAGNOSIS — J4520 Mild intermittent asthma, uncomplicated: Secondary | ICD-10-CM | POA: Diagnosis not present

## 2023-11-26 DIAGNOSIS — G4730 Sleep apnea, unspecified: Secondary | ICD-10-CM

## 2023-11-26 NOTE — Telephone Encounter (Signed)
  Chief Complaint: HTN Symptoms: H/A, "not feeling right" Frequency: today Pertinent Negatives: Patient denies CP, SOB Disposition: [x] ED /[] Urgent Care (no appt availability in office) / [] Appointment(In office/virtual)/ []  [place] [facility]/ [] Home Care/ [] Refused Recommended Disposition /[] [facility]/ []  Follow-up with PCP Additional Notes: Patient's [facility] nurse practitioner Retha Cast (fist time with pt), calling reporting HTN, H/A, and pt "not feeling right". Per Retha Cast, BP this AM at [facility] office was 150/91. Retha Cast is currently with pt and repeated BP manually with readings below. Based on sx, triager advised ED for further evaluation/tx. Caregiver verbalized understanding and to call back/911 with worsening symptoms.    Copied from CRM [id number]. Topic: Clinical - Red Word Triage >> Nov 26, 2023 12:38 PM Justina Oman C wrote: Red Word that prompted transfer to Nurse Triage: Patient [phone] asked for the blood pressure, informed blood pressure 150/91. Patient is with a [name] health nurse practioner states patient is not feeling right, symptoms headaches, dizziness and wants to know the office wanted patient to do. Please advise.         Reason for Disposition  [1] Systolic BP  >= 160 OR Diastolic >= 100 AND [2] cardiac (e.g., breathing difficulty, chest pain) or neurologic symptoms (e.g., new-onset blurred or double vision, unsteady gait)  Answer Assessment - Initial Assessment Questions 1. BLOOD PRESSURE: "What is the blood pressure?" "Did you take at least two measurements 5 minutes apart?"     150/91 at office 158/96 manual right before call  2. ONSET: "When did you take your blood pressure?"     At [facility] office this AM 3. HOW: "How did you take your blood pressure?" (e.g., automatic home BP monitor, visiting nurse)     Auto BP 4. HISTORY: "Do you have a history of high blood pressure?"     yes 5. MEDICINES: "Are you taking any medicines for blood  pressure?" "Have you missed any doses recently?"     Endorses taking all AM meds this morning 6. OTHER SYMPTOMS: "Do you have any symptoms?" (e.g., blurred vision, chest pain, difficulty breathing, headache, weakness)     H/A, "just don't feel right"  Protocols used: Blood Pressure - High-A-AH

## 2023-11-26 NOTE — Telephone Encounter (Signed)
 Please call and check in with patient, see if she want to come to office next week for evaluation if unable to get in this week.

## 2023-11-26 NOTE — Telephone Encounter (Signed)
 See triage notes from triage Nurse.  FYI  Message sent to Verma Gobble, NP

## 2023-11-26 NOTE — Telephone Encounter (Signed)
 Copied from CRM [id number]. Topic: Clinical - Red Word Triage >> Nov 26, 2023 12:38 PM [name] [name] C wrote: Red Word that prompted transfer to Nurse Triage: Patient [phone] asked for the blood pressure, informed blood pressure 150/91. Patient is with a [name] health nurse practioner states patient is not feeling right, symptoms headaches, dizziness and wants to know the office wanted patient to do. Please advise.

## 2023-11-26 NOTE — Telephone Encounter (Signed)
 Pt seen in clinic 11/26/23 at 9 am  Routing to [place] as FYI

## 2023-11-26 NOTE — Telephone Encounter (Signed)
 Please see alternate Nurse Triage note.

## 2023-11-26 NOTE — Patient Instructions (Addendum)
-  MILD INTERMITTENT ASTHMA: Mild intermittent asthma means you have occasional asthma symptoms that are generally well-controlled. Continue using Breo Ellipta  200 mcg, one puff daily, and use your albuterol  inhaler as needed every 4-6 hours. We will order a pulmonary function test for your next follow-up to assess your lung function. Contact the clinic if your symptoms worsen, including cough, difficulty breathing, chest tightness, or wheezing.  -SEVERE OBSTRUCTIVE SLEEP APNEA: Severe obstructive sleep apnea is a condition where your breathing stops and starts during sleep. Your weight loss has likely improved your symptoms. Continue aiming for a weight loss goal of 185 pounds, sleep on your side or use a wedge pillow to elevate your head, and avoid alcohol  before bed. Discuss the potential use of weight loss medication Zepbound (tirzepatide) with your primary care physician if needed. Avoid driving if you feel fatigued.  -CONGESTIVE HEART FAILURE: Congestive heart failure is a condition where your heart does not pump blood as well as it should. There were no specific changes or discussions about this condition during this visit.  -ATRIAL FIBRILLATION: Atrial fibrillation is an irregular and often rapid heart rate. Continue Eliquis  as directed.   Follow-up 6 months with 1 hour PFT prior with [facility] NP

## 2023-11-26 NOTE — Telephone Encounter (Addendum)
 Copied from CRM [id number]. Topic: Clinical - Red Word Triage >> Nov 26, 2023 12:37 PM Karole Pacer C wrote: Red Word that prompted transfer to Nurse Triage: Patient states she is completing in home health assessment nurse advised patient her blood pressure 160/100 right headache    Chief Complaint: Hypertension Symptoms: tired, headache, back of neck has pain Frequency: high bp noted at appt this morning Pertinent Negatives: Patient denies fever, chest pain, difficulty breathing Disposition: [] ED /[] Urgent Care (no appt availability in office) / [] Appointment(In office/virtual)/ []  [place] [facility]/ [] Home Care/ [x] Refused Recommended Disposition /[] [place] [facility]/ []  Follow-up with PCP Additional Notes:  Patient called and advised that this morning at an appt her blood pressure was 150/91.  160/100 is the blood pressure with a nurse doing a home health assessment. Patient states that she does have a slight headache.  Patient and home health nurse are advised that with those numbers and having a headache and patient stating to this RN that she didn't feel right and had to sit down---these were all concerns for recommendation to go to the Emergency Room.  Patient did not want to go to the Emergency Room and Home Health Nurse asked if the patient could just make an appointment.  Patient is also advised at this time that if she gets any worse to go to the Emergency Room---either have someone take her or call 911 for an ambulance to take her.  Patient verbalized understanding.  This RN called [facility] to advise them of an ER Refusal and they stated that they would reach out to the patient/home health nurse.    Reason for Disposition  [1] Systolic BP  >= 160 OR Diastolic >= 100 AND [2] cardiac (e.g., breathing difficulty, chest pain) or neurologic symptoms (e.g., new-onset blurred or double vision, unsteady gait)  Protocols used: Blood Pressure - High-A-AH

## 2023-11-27 NOTE — Telephone Encounter (Signed)
 I called and spoke to pt. Pt states she has an upcoming appointment with PCP on Monday but she is feeling much better. I informed pt that if she has any worsening symptoms, then it is advised to go to the UC or ED. Pt verbalized understanding. NFN

## 2023-11-27 NOTE — Progress Notes (Signed)
 This encounter was created in error - please disregard.

## 2023-11-27 NOTE — Telephone Encounter (Signed)
 Patient has made an appointment this for Next Monday 11/30/23. FYI

## 2023-11-27 NOTE — Telephone Encounter (Signed)
 Patient needs to contact PCP for HTN management, if symptomatic needs to go to UC/ED

## 2023-11-30 ENCOUNTER — Ambulatory Visit (INDEPENDENT_AMBULATORY_CARE_PROVIDER_SITE_OTHER): Admitting: Nurse Practitioner

## 2023-11-30 VITALS — BP 146/80 | HR 62 | Temp 96.5°F

## 2023-11-30 DIAGNOSIS — I480 Paroxysmal atrial fibrillation: Secondary | ICD-10-CM | POA: Diagnosis not present

## 2023-11-30 DIAGNOSIS — J449 Chronic obstructive pulmonary disease, unspecified: Secondary | ICD-10-CM | POA: Diagnosis not present

## 2023-11-30 DIAGNOSIS — I5032 Chronic diastolic (congestive) heart failure: Secondary | ICD-10-CM | POA: Diagnosis not present

## 2023-11-30 DIAGNOSIS — I10 Essential (primary) hypertension: Secondary | ICD-10-CM | POA: Diagnosis not present

## 2023-11-30 DIAGNOSIS — E785 Hyperlipidemia, unspecified: Secondary | ICD-10-CM

## 2023-11-30 DIAGNOSIS — M150 Primary generalized (osteo)arthritis: Secondary | ICD-10-CM

## 2023-11-30 MED ORDER — METOPROLOL SUCCINATE ER 25 MG PO TB24: 25.0000 mg | ORAL_TABLET | Freq: Every day | ORAL | 1 refills | Status: DC

## 2023-11-30 MED ORDER — BLOOD PRESSURE CUFF MISC: 0 refills | Status: DC

## 2023-11-30 MED ORDER — MONTELUKAST SODIUM 10 MG PO TABS: 10.0000 mg | ORAL_TABLET | Freq: Every day | ORAL | 1 refills | Status: DC

## 2023-11-30 NOTE — Progress Notes (Signed)
 Careteam: Patient Care Team: Verma Gobble, NP as PCP - General (Geriatric Medicine) Olinda Bertrand, DO as PCP - Cardiology (Cardiology)  PLACE OF SERVICE:  [facility]  Advanced Directive information    Allergies  Allergen Reactions   Sodium Ferric Gluconate [Ferrous Gluconate] Other (See Comments)    Patient had near syncope with abdominal pain and diaphoresis about an hour after IV ferric gluconate although it was while she was on commode having bowel movements   Imdur [Isosorbide Nitrate]     headache   Tetracyclines & Related Itching    Chief Complaint  Patient presents with   Hypertension     Pt  has follow up with pulmonary on last week and b/p was 150/91 and she has follow up the home health nurse that does the home  visit and it was  elevated also 158/90 she has been on medrol   for Copd last week , Discuss getting handicap stick /tagpt notice a slight head , she has knee pain that she  injection for the pain  pt is also on oxygen     HPI:  Discussed the use of AI scribe software for clinical note transcription with the patient, who gave verbal consent to proceed.  History of Present Illness [name] [name] is a 63 year old female with hypertension and heart failure who presents for follow-up on elevated blood pressure readings.  She is following up on her blood pressure management after recent elevated readings during a home health visit and a pulmonary appointment. She experienced headaches on Friday, which she associates with the elevated blood pressure. She is currently taking Entresto  twice a day for blood pressure control.  She has a history of heart failure and atrial fibrillation. She was previously on metoprolol  50 mg daily but is not currently taking it. She attempted to restart Farxiga  for fluid management but discontinued it after a week due to intolerable side effects. She is concerned about her weight, noting an increase from 205 lbs on Friday to 207 lbs  today, but denies any swelling or increased shortness of breath.  She has a history of COPD and is currently taking Singulair , which she finds helpful, especially during allergy season. She reports no recent exacerbations and is breathing better now.  She mentions using a handicap placard due to difficulty walking long distances, primarily due to knee pain. She receives knee injections every three months and has had a right hip replacement, which was a painful experience.  Her social history includes no alcohol  consumption. She mentions financial constraints impacting her ability to purchase a blood pressure cuff.    Review of Systems:  Review of Systems  Constitutional:  Negative for chills, fever and weight loss.  HENT:  Negative for tinnitus.   Respiratory:  Negative for cough, sputum production and shortness of breath.   Cardiovascular:  Negative for chest pain, palpitations and leg swelling.  Gastrointestinal:  Negative for abdominal pain, constipation, diarrhea and heartburn.  Genitourinary:  Negative for dysuria, frequency and urgency.  Musculoskeletal:  Positive for joint pain. Negative for back pain, falls and myalgias.  Skin: Negative.   Neurological:  Negative for dizziness and headaches.  Psychiatric/Behavioral:  Negative for depression and memory loss. The patient does not have insomnia.     Past Medical History:  Diagnosis Date   Anemia    Anxiety    Arthritis    Asthma    Bloody stool    Chronic diastolic heart failure (HCC)  COPD (chronic obstructive pulmonary disease) (HCC)    Dental decay    Dysrhythmia    Atrial flutter   Gastritis, Helicobacter pylori 06/22/2015   Hypertension    Paroxysmal atrial fibrillation (HCC)    Pneumonia 06/21/2015   Prepyloric ulcer 06/22/2015   Seasonal allergies    Sleep apnea    no CPAP   Past Surgical History:  Procedure Laterality Date   BARTHOLIN CYST MARSUPIALIZATION Left 07/22/1995   BIOPSY  11/04/2021    Procedure: BIOPSY;  Surgeon: Genell Ken, MD;  Location: [facility];  Service: Gastroenterology;;   [place] Im STUDY  10/31/2021   Procedure: BUBBLE STUDY;  Surgeon: Olinda Bertrand, DO;  Location: [facility];  Service: Cardiovascular;;   CARDIAC SURGERY N/A 07/21/1961   Repair of PFO   CARDIOVERSION N/A 10/31/2021   Procedure: CARDIOVERSION;  Surgeon: Olinda Bertrand, DO;  Location: [facility];  Service: Cardiovascular;  Laterality: N/A;   COLONOSCOPY N/A 07/21/2000   Performed secondary to mother's diagnosis of colon CA at age 5   COLONOSCOPY WITH PROPOFOL  N/A 11/04/2021   Procedure: COLONOSCOPY WITH PROPOFOL ;  Surgeon: Genell Ken, MD;  Location: [facility];  Service: Gastroenterology;  Laterality: N/A;   ESOPHAGOGASTRODUODENOSCOPY (EGD) WITH PROPOFOL  N/A 06/22/2015   Procedure: ESOPHAGOGASTRODUODENOSCOPY (EGD) WITH PROPOFOL ;  Surgeon: Evangeline Hilts, MD;  Location[facility];  Service: Endoscopy;  Laterality: N/A;   ESOPHAGOGASTRODUODENOSCOPY (EGD) WITH PROPOFOL  N/A 11/04/2021   Procedure: ESOPHAGOGASTRODUODENOSCOPY (EGD) WITH PROPOFOL ;  Surgeon: Genell Ken, MD;  Location: [facility];  Service: Gastroenterology;  Laterality: N/A;   FRACTURE SURGERY Left 2005   ORIF tibia   NASAL SINUS SURGERY Bilateral 07/21/1982   OVARIAN CYST REMOVAL Left 07/21/1988   POLYPECTOMY  11/04/2021   Procedure: POLYPECTOMY;  Surgeon: Genell Ken, MD;  Location: [facility];  Service: Gastroenterology;;   TEE WITHOUT CARDIOVERSION N/A 10/31/2021   Procedure: TRANSESOPHAGEAL ECHOCARDIOGRAM (TEE);  Surgeon: Olinda Bertrand, DO;  Location: [facility];  Service: Cardiovascular;  Laterality: N/A;   TOTAL HIP ARTHROPLASTY Right 04/22/2023   Procedure: TOTAL HIP ARTHROPLASTY ANTERIOR APPROACH;  Surgeon: Adonica Hoose, MD;  Location: [facility];  Service: Orthopedics;  Laterality: Right;  140   TUBAL LIGATION     UMBILICAL HERNIA REPAIR N/A 07/22/2003   Social History:   reports that she has never smoked. She  has never used smokeless tobacco. She reports that she does not currently use alcohol . She reports that she does not use drugs.  Family History  Problem Relation Age of Onset   Colon cancer Mother 5   Hypertension Mother    Cancer Father 71       liver cancer   Hypertension Father    Cerebral aneurysm Sister    Goiter Sister    Breast cancer Neg Hx     Medications: Patient's Medications  New Prescriptions   No medications on file  Previous Medications   ALBUTEROL  (PROVENTIL ) (2.5 MG/3ML) 0.083% NEBULIZER SOLUTION    Take 3 mLs (2.5 mg total) by nebulization every 6 (six) hours as needed for wheezing or shortness of breath.   ALBUTEROL  (VENTOLIN  HFA) 108 (90 BASE) MCG/ACT INHALER    Inhale 2 puffs into the lungs every 6 (six) hours as needed for wheezing or shortness of breath.   ATORVASTATIN  (LIPITOR) 20 MG TABLET    TAKE 1 TABLET (20 MG TOTAL) BY MOUTH AT BEDTIME FOR CHOLESTEROL   BIOFLAVONOID PRODUCTS (VITAMIN C PLUS PO)    Take 1 tablet by mouth daily.   BREO ELLIPTA  200-25 MCG/ACT AEPB  TAKE 1 PUFF BY MOUTH EVERY DAY   CHOLECALCIFEROL  (VITAMIN D3) 25 MCG (1000 UNIT) TABLET    Take 1,000 Units by mouth in the morning.   ELIQUIS  5 MG TABS TABLET    TAKE 1 TABLET BY MOUTH TWICE A DAY   ENTRESTO  24-26 MG    TAKE 1 TABLET BY MOUTH TWICE A DAY   MONTELUKAST  (SINGULAIR ) 10 MG TABLET    Take 1 tablet (10 mg total) by mouth at bedtime.   OXYGEN    Inhale 2 L/min into the lungs as needed (wheezing/respiratory issues.).   TRAZODONE  (DESYREL ) 50 MG TABLET    TAKE 1 TABLET BY MOUTH EVERYDAY AT BEDTIME  Modified Medications   No medications on file  Discontinued Medications   No medications on file                   Physical Exam:  Vitals:   11/30/23 1030  BP: (!) 146/80  Pulse: 62  Temp: (!) 96.5 F (35.8 C)  SpO2: 92%   There is no height or weight on file to calculate BMI. Wt Readings from Last 3 Encounters:  11/26/23 205 lb 12.8 oz (93.4 kg)  10/30/23 205  lb 3.2 oz (93.1 kg)  10/26/23 204 lb 5.9 oz (92.7 kg)    Physical Exam Constitutional:      General: She is not in acute distress.    Appearance: She is well-developed. She is not diaphoretic.  HENT:     Head: Normocephalic and atraumatic.     Mouth/Throat:     Pharynx: No oropharyngeal exudate.  Eyes:     Conjunctiva/sclera: Conjunctivae normal.     Pupils: Pupils are equal, round, and reactive to light.  Cardiovascular:     Rate and Rhythm: Normal rate and regular rhythm.     Heart sounds: Normal heart sounds.  Pulmonary:     Effort: Pulmonary effort is normal.     Breath sounds: Normal breath sounds.  Abdominal:     General: Bowel sounds are normal.     Palpations: Abdomen is soft.  Musculoskeletal:     Cervical back: Normal range of motion and neck supple.     Right lower leg: No edema.     Left lower leg: No edema.  Skin:    General: Skin is warm and dry.  Neurological:     Mental Status: She is alert and oriented to person, place, and time.     Gait: Gait abnormal (due to pain).  Psychiatric:        Mood and Affect: Mood normal.     Labs reviewed: Basic Metabolic Panel: Recent Labs    07/27/23 1920 09/05/23 1641 10/26/23 0904  NA 137 135 139  K 4.2 3.1* 3.4*  CL 105 101 105  CO2 21* 24 22  GLUCOSE 92 122* 98  BUN 13 9 12   CREATININE 0.53 0.59 0.47  CALCIUM  9.0 9.0 8.7*   Liver Function Tests: Recent Labs    04/10/23 1039 07/17/23 0955 09/05/23 1641 10/26/23 0904  AST 15 14 22 19   ALT 13 9 17 15   ALKPHOS 52  --  55 59  BILITOT 0.9 0.4 0.7 0.8  PROT 7.0 7.1 7.5 7.1  ALBUMIN 3.8  --  4.0 3.8   No results for input(s): "LIPASE", "AMYLASE" in the last 8760 hours. No results for input(s): "AMMONIA" in the last 8760 hours. CBC: Recent Labs    07/17/23 0955 07/27/23 1920 09/05/23 1641 10/26/23 0904  WBC  7.9 7.8 5.7 8.0  NEUTROABS 4,930  --  4.1 4.4  HGB 11.9 11.2* 11.1* 11.1*  HCT 37.6 35.0* 35.9* 34.6*  MCV 86.8 86.4 89.3 90.8  PLT 277  141* 184 230   Lipid Panel: Recent Labs    02/16/23 1419 07/17/23 0955  CHOL 168 180  HDL 52 63  LDLCALC 101* 101*  TRIG 60 71  CHOLHDL 3.2 2.9   TSH: No results for input(s): "TSH" in the last 8760 hours. A1C: Lab Results  Component Value Date   HGBA1C 5.6 10/30/2021     Assessment/Plan  Assessment & Plan Hypertension Blood pressure elevated, headaches reported. Current medication may be insufficient. Discussed importance of control to prevent cardiac complications. - Start metoprolol  25 mg daily, 30-day supply, one refill. - Advise purchase of blood pressure cuff for home monitoring. - Follow up with cardiologist in 1-2 weeks (overdue for appt) or follow up here at [facility]  Heart failure Management ongoing with Entresto . No swelling or increased shortness of breath. Emphasized blood pressure control to prevent cardiac damage. - Continue Entresto  as prescribed. - restart metoprolol  25 mg daily  - Monitor for fluid retention or worsening heart failure.  Atrial fibrillation Not on rate control medication. Metoprolol  will aid in rate control and blood pressure management. Heart rate stable. Discussed metoprolol 's effect on heart rate. - Start metoprolol  25 mg daily. - Monitor heart rate, report if below 50 bpm or if symptoms occur.  Chronic obstructive pulmonary disease (COPD) COPD well-managed. Improved breathing, no recent exacerbations. Singulair  effective, especially during allergy season. - Continue Singulair , 90-day supply, one refill. - Monitor for COPD exacerbation.  Knee pain due to osteoarthritis Chronic knee pain managed with injections and braces. Significant pain and difficulty walking. Hesitant about future surgeries. - Continue knee injections as scheduled. - Use knee braces for support. - Discuss potential future interventions if pain worsens.  1-2 weeks here or with cardiologist for BP Nevyn Bossman K. Denney Fisherman [facility] [phone]

## 2023-11-30 NOTE — Patient Instructions (Signed)
 Start metoprolol  25 mg daily To check blood pressure and HR daily To notify if HR <50. Or symptoms.   Call cardiologist and follow up in 1-2 weeks.- see me if unable to get in with them. (Or sent mychart msg)

## 2023-12-22 DIAGNOSIS — M170 Bilateral primary osteoarthritis of knee: Secondary | ICD-10-CM | POA: Diagnosis not present

## 2023-12-23 ENCOUNTER — Ambulatory Visit: Payer: Self-pay

## 2023-12-23 NOTE — Telephone Encounter (Signed)
 Prednisone  not listed on medication list. Did another provider prescribe? Frequent urination is a common side effect of medication. These symptoms should subside when medication is stopped.

## 2023-12-23 NOTE — Telephone Encounter (Signed)
 FYI Only or Action Required?: FYI only for provider  Patient was last seen in primary care on 11/30/2023 by Verma Gobble, NP. Called Nurse Triage reporting Medication Reaction. Symptoms began yesterday. Interventions attempted: Nothing. Symptoms are: unchanged.  Triage Disposition: No disposition on file.  Patient/caregiver understands and will follow disposition?:  Reason for Disposition  [1] Caller has NON-URGENT medicine question about med that PCP prescribed AND [2] triager unable to answer question  Answer Assessment - Initial Assessment Questions 1. NAME of MEDICINE: "What medicine(s) are you calling about?"     Prednisone   2. QUESTION: "What is your question?" (e.g., double dose of medicine, side effect)     Side effects- pt states since starting the prednisone  she has been having some increase in urination.  3. PRESCRIBER: "Who prescribed the medicine?" Reason: if prescribed by specialist, call should be referred to that group.     [place] 4. SYMPTOMS: "Do you have any symptoms?" If Yes, ask: "What symptoms are you having?"  "How bad are the symptoms (e.g., mild, moderate, severe)     Increase in urination.  Protocols used: Medication Question Call-A-AH

## 2023-12-23 NOTE — Telephone Encounter (Signed)
 Patient called, left VM to return the call to the office to speak to NT.      Copied from CRM [id number]. Topic: Clinical - Medical Advice >> Dec 23, 2023  9:35 AM Adrianna P wrote: Reason for JYN:WGNFAOZ has been taking predniSONE  (DELTASONE ) 20 MG tablet [[id number]] she took 3 yesterday and 3 this morning, and it has been making her urinating  a lot, and she would like to know what to do.

## 2023-12-23 NOTE — Telephone Encounter (Signed)
 Left a detailed message on voicemail with Amy's response

## 2023-12-23 NOTE — Telephone Encounter (Signed)
 It appears that the patient is asking for a directive. Please advise

## 2023-12-24 DIAGNOSIS — J9600 Acute respiratory failure, unspecified whether with hypoxia or hypercapnia: Secondary | ICD-10-CM | POA: Diagnosis not present

## 2023-12-24 DIAGNOSIS — J45909 Unspecified asthma, uncomplicated: Secondary | ICD-10-CM | POA: Diagnosis not present

## 2024-01-14 ENCOUNTER — Telehealth: Payer: Self-pay

## 2024-01-14 NOTE — Telephone Encounter (Signed)
 Copied from CRM [id number]. Topic: Clinical - Medication Question >> Jan 14, 2024  2:43 PM Cherylann RAMAN wrote: Reason for CRM: Patient is requesting a prescription for a yeast infection. Please contact patient at [phone] for additional information.

## 2024-01-14 NOTE — Telephone Encounter (Signed)
 I called patient and notified her that she needs to be seen in office before prescription can be sent in for yeast infection. Patient states that she already has upcoming appointment 01/18/2024 and she will just wait until then to speak about concern and have treatment. Message routed to PCP Caro, Harlene POUR, NP as RICK.

## 2024-01-18 ENCOUNTER — Ambulatory Visit: Payer: 59 | Admitting: Nurse Practitioner

## 2024-01-23 DIAGNOSIS — J45909 Unspecified asthma, uncomplicated: Secondary | ICD-10-CM | POA: Diagnosis not present

## 2024-01-23 DIAGNOSIS — J9600 Acute respiratory failure, unspecified whether with hypoxia or hypercapnia: Secondary | ICD-10-CM | POA: Diagnosis not present

## 2024-01-31 ENCOUNTER — Other Ambulatory Visit: Payer: Self-pay | Admitting: Nurse Practitioner

## 2024-01-31 DIAGNOSIS — I10 Essential (primary) hypertension: Secondary | ICD-10-CM

## 2024-01-31 DIAGNOSIS — I480 Paroxysmal atrial fibrillation: Secondary | ICD-10-CM

## 2024-01-31 DIAGNOSIS — I5032 Chronic diastolic (congestive) heart failure: Secondary | ICD-10-CM

## 2024-01-31 NOTE — Progress Notes (Unsigned)
 Location:  [facility]  POS: Clinic  Provider: Tawni America, ANP  Code Status: *** Goals of Care:     10/26/2023    8:56 AM  Advanced Directives  Does [name] [name] Have a Medical Advance Directive? Yes  Type of Estate agent of [place];Living will     No chief complaint on file.   HPI: Discussed the use of AI scribe software for clinical note transcription with the [name] [name], who gave verbal consent to proceed.  History of Present Illness           Started metoprolol  25 mg 11/30/23 for HTN and rate control related to afib. Pt was supposed to f/u with [facility] or cardiology  Has CHF on entresto  COPD on Singulair  and Breo, prn albuterol  Afib on eliquis  HLD LDL 101 07/17/23 on statin Mammogram 10/06/23 normal Sleep apnea, 02 2 liters with sleep  Hx of severe sleep apnea but did not tolerate mask Then lost weight Past Medical History:  Diagnosis Date   Anemia    Anxiety    Arthritis    Asthma    Bloody stool    Chronic diastolic heart failure (HCC)    COPD (chronic obstructive pulmonary disease) (HCC)    Dental decay    Dysrhythmia    Atrial flutter   Gastritis, Helicobacter pylori 06/22/2015   Hypertension    Paroxysmal atrial fibrillation (HCC)    Pneumonia 06/21/2015   Prepyloric ulcer 06/22/2015   Seasonal allergies    Sleep apnea    no CPAP    Past Surgical History:  Procedure Laterality Date   BARTHOLIN CYST MARSUPIALIZATION Left 07/22/1995   BIOPSY  11/04/2021   Procedure: BIOPSY;  Surgeon: Saintclair Jasper, MD;  Location: [facility];  Service: Gastroenterology;;   VASSIE STUDY  10/31/2021   Procedure: BUBBLE STUDY;  Surgeon: Michele Richardson, DO;  [facility];  Service: Cardiovascular;;   CARDIAC SURGERY N/A 07/21/1961   Repair of PFO   CARDIOVERSION N/A 10/31/2021   Procedure: CARDIOVERSION;  Surgeon: Michele Richardson, DO;  [facility];  Service: Cardiovascular;  Laterality: N/A;   COLONOSCOPY N/A 07/21/2000   Performed  secondary to mother's diagnosis of colon CA at age 17   COLONOSCOPY WITH PROPOFOL  N/A 11/04/2021   Procedure: COLONOSCOPY WITH PROPOFOL ;  Surgeon: Saintclair Jasper, MD;  Location: [facility];  Service: Gastroenterology;  Laterality: N/A;   ESOPHAGOGASTRODUODENOSCOPY (EGD) WITH PROPOFOL  N/A 06/22/2015   Procedure: ESOPHAGOGASTRODUODENOSCOPY (EGD) WITH PROPOFOL ;  Surgeon: Elsie Cree, MD;  Location[facility];  Service: Endoscopy;  Laterality: N/A;   ESOPHAGOGASTRODUODENOSCOPY (EGD) WITH PROPOFOL  N/A 11/04/2021   Procedure: ESOPHAGOGASTRODUODENOSCOPY (EGD) WITH PROPOFOL ;  Surgeon: Saintclair Jasper, MD;  [facility];  Service: Gastroenterology;  Laterality: N/A;   FRACTURE SURGERY Left 2005   ORIF tibia   NASAL SINUS SURGERY Bilateral 07/21/1982   OVARIAN CYST REMOVAL Left 07/21/1988   POLYPECTOMY  11/04/2021   Procedure: POLYPECTOMY;  Surgeon: Saintclair Jasper, MD;  [facility];  Service: Gastroenterology;;   TEE WITHOUT CARDIOVERSION N/A 10/31/2021   Procedure: TRANSESOPHAGEAL ECHOCARDIOGRAM (TEE);  Surgeon: Michele Richardson, DO;  [facility];  Service: Cardiovascular;  Laterality: N/A;   TOTAL HIP ARTHROPLASTY Right 04/22/2023   Procedure: TOTAL HIP ARTHROPLASTY ANTERIOR APPROACH;  Surgeon: Fidel Rogue, MD;  [facility]: [facility];  Service: Orthopedics;  Laterality: Right;  140   TUBAL LIGATION     UMBILICAL HERNIA REPAIR N/A 07/22/2003    Allergies  Allergen Reactions   Sodium Ferric Gluconate [Ferrous Gluconate] Other (See Comments)  [name] [name] had near syncope with abdominal pain and diaphoresis about an hour after IV ferric gluconate although it was while she was on commode having bowel movements   Imdur [Isosorbide Nitrate]     headache   Tetracyclines & Related Itching    Outpatient Encounter Medications as of 02/02/2024  Medication Sig   albuterol  (PROVENTIL ) (2.5 MG/3ML) 0.083% nebulizer solution Take 3 mLs (2.5 mg total) by nebulization every 6 (six) hours  as needed for wheezing or shortness of breath.   albuterol  (VENTOLIN  HFA) 108 (90 Base) MCG/ACT inhaler Inhale 2 puffs into the lungs every 6 (six) hours as needed for wheezing or shortness of breath.   atorvastatin  (LIPITOR) 20 MG tablet TAKE 1 TABLET (20 MG TOTAL) BY MOUTH AT BEDTIME FOR CHOLESTEROL   Bioflavonoid Products (VITAMIN C PLUS PO) Take 1 tablet by mouth daily.   Blood Pressure Monitoring (BLOOD PRESSURE CUFF) MISC To take blood pressure daily 1 hour after medication   BREO ELLIPTA  200-25 MCG/ACT AEPB TAKE 1 PUFF BY MOUTH EVERY DAY   cholecalciferol  (VITAMIN D3) 25 MCG (1000 UNIT) tablet Take 1,000 Units by mouth in the morning.   ELIQUIS  5 MG TABS tablet TAKE 1 TABLET BY MOUTH TWICE A DAY   ENTRESTO  24-26 MG TAKE 1 TABLET BY MOUTH TWICE A DAY   metoprolol  succinate (TOPROL -XL) 25 MG 24 hr tablet Take 1 tablet (25 mg total) by mouth daily.   montelukast  (SINGULAIR ) 10 MG tablet Take 1 tablet (10 mg total) by mouth at bedtime.   OXYGEN  Inhale 2 L/min into the lungs as needed (wheezing/respiratory issues.).   traZODone  (DESYREL ) 50 MG tablet TAKE 1 TABLET BY MOUTH EVERYDAY AT BEDTIME   No facility-administered encounter medications on file as of 02/02/2024.    Review of Systems:  Review of Systems  Health Maintenance  Topic Date Due   Pneumococcal Vaccine 57-27 Years old (1 of 2 - PCV) Never done   Cervical Cancer Screening (HPV/Pap Cotest)  Never done   Medicare Annual Wellness (AWV)  02/19/2024   Zoster Vaccines- Shingrix (1 of 2) 07/21/2048 (Originally 02/20/2011)   INFLUENZA VACCINE  02/19/2024   DTaP/Tdap/Td (2 - Td or Tdap) 03/26/2025   MAMMOGRAM  10/05/2025   Colonoscopy  11/05/2031   Hepatitis C Screening  Completed   HIV Screening  Completed   Hepatitis B Vaccines  Aged Out   HPV VACCINES  Aged Out   Meningococcal B Vaccine  Aged Out   COVID-19 Vaccine  Discontinued    Physical Exam: There were no vitals filed for this visit. There is no height or weight on  file to calculate BMI. Physical Exam  Labs reviewed: Basic Metabolic Panel: Recent Labs    07/27/23 1920 09/05/23 1641 10/26/23 0904  NA 137 135 139  K 4.2 3.1* 3.4*  CL 105 101 105  CO2 21* 24 22  GLUCOSE 92 122* 98  BUN 13 9 12   CREATININE 0.53 0.59 0.47  CALCIUM  9.0 9.0 8.7*   Liver Function Tests: Recent Labs    04/10/23 1039 07/17/23 0955 09/05/23 1641 10/26/23 0904  AST 15 14 22 19   ALT 13 9 17 15   ALKPHOS 52  --  55 59  BILITOT 0.9 0.4 0.7 0.8  PROT 7.0 7.1 7.5 7.1  ALBUMIN 3.8  --  4.0 3.8   No results for input(s): LIPASE, AMYLASE in the last 8760 hours. No results for input(s): AMMONIA in the last 8760 hours. CBC: Recent Labs    07/17/23 0955 07/27/23  1920 09/05/23 1641 10/26/23 0904  WBC 7.9 7.8 5.7 8.0  NEUTROABS 4,930  --  4.1 4.4  HGB 11.9 11.2* 11.1* 11.1*  HCT 37.6 35.0* 35.9* 34.6*  MCV 86.8 86.4 89.3 90.8  PLT 277 141* 184 230   Lipid Panel: Recent Labs    02/16/23 1419 07/17/23 0955  CHOL 168 180  HDL 52 63  LDLCALC 101* 101*  TRIG 60 71  CHOLHDL 3.2 2.9   Lab Results  Component Value Date   HGBA1C 5.6 10/30/2021    Procedures since last visit: No results found.  Assessment/Plan Assessment and Plan               Labs/tests ordered:  * No order type specified * CBC BMP Recommend cervical ca screening Next appt:  02/25/2024   Total time **:  time greater than 50% of total time spent doing pt counseling and coordination of care

## 2024-02-02 ENCOUNTER — Encounter: Admitting: Adult Health

## 2024-02-03 ENCOUNTER — Ambulatory Visit: Payer: Self-pay

## 2024-02-03 ENCOUNTER — Ambulatory Visit: Admitting: Orthopedic Surgery

## 2024-02-03 NOTE — Telephone Encounter (Signed)
 FYI Only or Action Required?: FYI only for provider.  Patient was last seen in primary care on 11/30/2023 by [name], NP.  Called Nurse Triage reporting Shortness of Breath.  Symptoms began yesterday.  Interventions attempted: Prescription medications: BP medication taken.  Symptoms are: gradually improving.  Triage Disposition: See PCP When Office is Open (Within 3 Days)  Patient/caregiver understands and will follow disposition?: Yes     Copied from CRM [id number]. Topic: Clinical - Red Word Triage >> Feb 03, 2024  1:11 PM Brittney F wrote: [facility] that prompted transfer to Nurse Triage:   150/91 this morning   Black dots appearing off to the side and floating in front of her  Shortness of breath a little  Looking to rescheduled this afternoon's appointment for tomorrow. Reason for Disposition  Systolic BP >= 160 OR Diastolic >= 100  Answer Assessment - Initial Assessment Questions 1. RESPIRATORY STATUS: Describe your breathing? (e.g., wheezing, shortness of breath, unable to speak, severe coughing)      SOB with excertion but none now 2. ONSET: When did this breathing problem begin?      yesterday 3. PATTERN Does the difficult breathing come and go, or has it been constant since it started?      Comes and goes 4. SEVERITY: How bad is your breathing? (e.g., mild, moderate, severe)      Mild to moderate 5. RECURRENT SYMPTOM: Have you had difficulty breathing before? If Yes, ask: When was the last time? and What happened that time?      yes 6. CARDIAC HISTORY: Do you have any history of heart disease? (e.g., heart attack, angina, bypass surgery, angioplasty)      na 7. LUNG HISTORY: Do you have any history of lung disease?  (e.g., pulmonary embolus, asthma, emphysema)     na 8. CAUSE: What do you think is causing the breathing problem?      unknown 9. OTHER SYMPTOMS: Do you have any other symptoms? (e.g., chest pain, cough, dizziness,  fever, runny nose)     Black spots in vision 10. O2 SATURATION MONITOR:  Do you use an oxygen  saturation monitor (pulse oximeter) at home? If Yes, ask: What is your reading (oxygen  level) today? What is your usual oxygen  saturation reading? (e.g., 95%)       na 11. PREGNANCY: Is there any chance you are pregnant? When was your last menstrual period?       na 12. TRAVEL: Have you traveled out of the country in the last month? (e.g., travel history, exposures)       na  Answer Assessment - Initial Assessment Questions 1. BLOOD PRESSURE: What is your blood pressure? Did you take at least two measurements 5 minutes apart?     150/91 - pt states this BP is the best she had in a while 2. ONSET: When did you take your blood pressure?     today 3. HOW: How did you take your blood pressure? (e.g., automatic home BP monitor, visiting nurse)     automatic 4. HISTORY: Do you have a history of high blood pressure?     yes 5. MEDICINES: Are you taking any medicines for blood pressure? Have you missed any doses recently?     na 6. OTHER SYMPTOMS: Do you have any symptoms? (e.g., blurred vision, chest pain, difficulty breathing, headache, weakness)     SOB yesterday but none today, black dots  floating in vision 7. PREGNANCY: Is there any chance you are  pregnant? When was your last menstrual period?     Na  Pt stated she has been seeing black dots floating for while - she has not had a chance to inform her PCP.  Protocols used: Breathing Difficulty-A-AH, Blood Pressure - High-A-AH

## 2024-02-04 ENCOUNTER — Ambulatory Visit: Admitting: Orthopedic Surgery

## 2024-02-04 ENCOUNTER — Encounter: Payer: Self-pay | Admitting: Orthopedic Surgery

## 2024-02-04 VITALS — BP 142/80 | HR 62 | Temp 96.8°F | Resp 16 | Ht 63.0 in | Wt 207.6 lb

## 2024-02-04 DIAGNOSIS — I10 Essential (primary) hypertension: Secondary | ICD-10-CM | POA: Diagnosis not present

## 2024-02-04 DIAGNOSIS — H43391 Other vitreous opacities, right eye: Secondary | ICD-10-CM

## 2024-02-04 NOTE — Progress Notes (Signed)
 Careteam: Patient Care Team: [name], NP as PCP - General (Geriatric Medicine) Michele Richardson, DO as PCP - Cardiology (Cardiology)  Seen by: Greig Cluster, AGNP-C  PLACE OF SERVICE:  [facility]  Advanced Directive information    Allergies  Allergen Reactions   Sodium Ferric Gluconate [Ferrous Gluconate] Other (See Comments)    Patient had near syncope with abdominal pain and diaphoresis about an hour after IV ferric gluconate although it was while she was on commode having bowel movements   Imdur [Isosorbide Nitrate]     headache   Tetracyclines & Related Itching    Chief Complaint  Patient presents with   Hypertension    Patient complains of blurred vision and blood pressure being high since Saturday 01/30/2024. Patient has complained of some blurred vision as well with this. She has an automatic cuff that is reading higher than manual cuff.    Form Completion    Handicap placard.      HPI: Patient is a 63 y.o. female seen today for acute visit due to vision changes.   Discussed the use of AI scribe software for clinical note transcription with the patient, who gave verbal consent to proceed.  History of Present Illness   [name] [name] is a 63 year old female who presents with vision changes and elevated blood pressure.  She has been experiencing vision changes, specifically seeing black spots or 'floaters' in her right eye, for approximately two weeks. There is no history of trauma or injury to the eye. She has not noticed any significant changes in her ability to read or drive, although she avoids driving at night. She uses reading glasses and had an eye examination two years ago, during which cataracts were noted but not considered concerning at that time.  05/12 started on metoprolol  due to hypertension. She reports elevated blood pressure readings at home, with a recent measurement of 156/106 mmHg. She uses a home blood pressure cuff and is considering obtaining  a new one due to discrepancies in readings. She brought her bp cuff today and it read 164/92, in office manual bp was 142/80. She takes metoprolol  in the morning for blood pressure management.  Requested handicap due to chronic knee pain. Appears same request with PCP 11/2023. Plan to approve for another 3 months. Advised to discuss with PCP next routine visit.   Review of Systems:  Review of Systems  Constitutional: Negative.   HENT: Negative.    Eyes:  Positive for blurred vision. Negative for pain, discharge and redness.  Respiratory:  Positive for shortness of breath.   Cardiovascular: Negative.   Gastrointestinal: Negative.   Genitourinary: Negative.   Musculoskeletal: Negative.   Neurological:  Negative for headaches.  Psychiatric/Behavioral: Negative.      Past Medical History:  Diagnosis Date   Anemia    Anxiety    Arthritis    Asthma    Bloody stool    Chronic diastolic heart failure (HCC)    COPD (chronic obstructive pulmonary disease) (HCC)    Dental decay    Dysrhythmia    Atrial flutter   Gastritis, Helicobacter pylori 06/22/2015   Hypertension    Paroxysmal atrial fibrillation (HCC)    Pneumonia 06/21/2015   Prepyloric ulcer 06/22/2015   Seasonal allergies    Sleep apnea    no CPAP   Past Surgical History:  Procedure Laterality Date   BARTHOLIN CYST MARSUPIALIZATION Left 07/22/1995   BIOPSY  11/04/2021   Procedure: BIOPSY;  Surgeon: Saintclair Jasper, MD;  Location: [facility];  Service: Gastroenterology;;   VASSIE STUDY  10/31/2021   Procedure: BUBBLE STUDY;  Surgeon: Michele Richardson, DO;  [facility]: [facility];  Service: Cardiovascular;;   CARDIAC SURGERY N/A 07/21/1961   Repair of PFO   CARDIOVERSION N/A 10/31/2021   Procedure: CARDIOVERSION;  Surgeon: Michele Richardson, DO;  [facility];  Service: Cardiovascular;  Laterality: N/A;   COLONOSCOPY N/A 07/21/2000   Performed secondary to mother's diagnosis of colon CA at age 73   COLONOSCOPY WITH  PROPOFOL  N/A 11/04/2021   Procedure: COLONOSCOPY WITH PROPOFOL ;  Surgeon: Saintclair Jasper, MD;  [facility];  Service: Gastroenterology;  Laterality: N/A;   ESOPHAGOGASTRODUODENOSCOPY (EGD) WITH PROPOFOL  N/A 06/22/2015   Procedure: ESOPHAGOGASTRODUODENOSCOPY (EGD) WITH PROPOFOL ;  Surgeon: Elsie Cree, MD;  Location: [facility];  Service: Endoscopy;  Laterality: N/A;   ESOPHAGOGASTRODUODENOSCOPY (EGD) WITH PROPOFOL  N/A 11/04/2021   Procedure: ESOPHAGOGASTRODUODENOSCOPY (EGD) WITH PROPOFOL ;  Surgeon: Saintclair Jasper, MD;  [facility];  Service: Gastroenterology;  Laterality: N/A;   FRACTURE SURGERY Left 2005   ORIF tibia   NASAL SINUS SURGERY Bilateral 07/21/1982   OVARIAN CYST REMOVAL Left 07/21/1988   POLYPECTOMY  11/04/2021   Procedure: POLYPECTOMY;  Surgeon: Saintclair Jasper, MD;  [facility];  Service: Gastroenterology;;   TEE WITHOUT CARDIOVERSION N/A 10/31/2021   Procedure: TRANSESOPHAGEAL ECHOCARDIOGRAM (TEE);  Surgeon: Michele Richardson, DO;  [facility];  Service: Cardiovascular;  Laterality: N/A;   TOTAL HIP ARTHROPLASTY Right 04/22/2023   Procedure: TOTAL HIP ARTHROPLASTY ANTERIOR APPROACH;  Surgeon: Fidel Rogue, MD;  [facility];  Service: Orthopedics;  Laterality: Right;  140   TUBAL LIGATION     UMBILICAL HERNIA REPAIR N/A 07/22/2003   Social History:   reports that she has never smoked. She has never used smokeless tobacco. She reports that she does not currently use alcohol . She reports that she does not use drugs.  Family History  Problem Relation Age of Onset   Colon cancer Mother 40   Hypertension Mother    Cancer Father 53       liver cancer   Hypertension Father    Cerebral aneurysm Sister    Goiter Sister    Breast cancer Neg Hx     Medications: Patient's Medications  New Prescriptions   No medications on file  Previous Medications   ALBUTEROL  (PROVENTIL ) (2.5 MG/3ML) 0.083% NEBULIZER SOLUTION    Take 3 mLs (2.5 mg  total) by nebulization every 6 (six) hours as needed for wheezing or shortness of breath.   ALBUTEROL  (VENTOLIN  HFA) 108 (90 BASE) MCG/ACT INHALER    Inhale 2 puffs into the lungs every 6 (six) hours as needed for wheezing or shortness of breath.   ATORVASTATIN  (LIPITOR) 20 MG TABLET    TAKE 1 TABLET (20 MG TOTAL) BY MOUTH AT BEDTIME FOR CHOLESTEROL   BIOFLAVONOID PRODUCTS (VITAMIN C PLUS PO)    Take 1 tablet by mouth daily.   BLOOD PRESSURE MONITORING (BLOOD PRESSURE CUFF) MISC    To take blood pressure daily 1 hour after medication   BREO ELLIPTA  200-25 MCG/ACT AEPB    TAKE 1 PUFF BY MOUTH EVERY DAY   CHOLECALCIFEROL  (VITAMIN D3) 25 MCG (1000 UNIT) TABLET    Take 1,000 Units by mouth in the morning.   ELIQUIS  5 MG TABS TABLET    TAKE 1 TABLET BY MOUTH TWICE A DAY   ENTRESTO  24-26 MG    TAKE 1 TABLET BY MOUTH TWICE A DAY  METOPROLOL  SUCCINATE (TOPROL -XL) 25 MG 24 HR TABLET    TAKE 1 TABLET (25 MG TOTAL) BY MOUTH DAILY.   MONTELUKAST  (SINGULAIR ) 10 MG TABLET    Take 1 tablet (10 mg total) by mouth at bedtime.   OXYGEN     Inhale 2 L/min into the lungs as needed (wheezing/respiratory issues.).   TRAZODONE  (DESYREL ) 50 MG TABLET    TAKE 1 TABLET BY MOUTH EVERYDAY AT BEDTIME  Modified Medications   No medications on file  Discontinued Medications   No medications on file    Physical Exam:  Vitals:   02/04/24 0843  BP: (!) 142/80  Pulse: 62  Resp: 16  Temp: (!) 96.8 F (36 C)  SpO2: 97%  Weight: 207 lb 9.6 oz (94.2 kg)  Height: 5' 3 (1.6 m)   Body mass index is 36.77 kg/m. Wt Readings from Last 3 Encounters:  02/04/24 207 lb 9.6 oz (94.2 kg)  11/26/23 205 lb 12.8 oz (93.4 kg)  10/30/23 205 lb 3.2 oz (93.1 kg)    Physical Exam Vitals reviewed.  Constitutional:      General: She is not in acute distress.    Appearance: She is obese.  HENT:     Head: Normocephalic.  Eyes:     General: Lids are normal.        Right eye: No discharge.        Left eye: No discharge.      Extraocular Movements:     Right eye: Normal extraocular motion.     Left eye: Normal extraocular motion.     Conjunctiva/sclera:     Right eye: No hemorrhage.    Left eye: No hemorrhage.    Pupils: Pupils are equal, round, and reactive to light.     Funduscopic exam:    Right eye: No papilledema. Red reflex present.        Left eye: No papilledema. Red reflex present.    Slit lamp exam:    Right eye: No foreign body or photophobia.     Left eye: No foreign body or photophobia.  Cardiovascular:     Rate and Rhythm: Normal rate and regular rhythm.     Pulses: Normal pulses.     Heart sounds: Normal heart sounds.  Pulmonary:     Effort: Pulmonary effort is normal.     Breath sounds: Normal breath sounds.  Abdominal:     General: Bowel sounds are normal.     Palpations: Abdomen is soft.  Musculoskeletal:        General: Normal range of motion.     Cervical back: Neck supple.  Skin:    General: Skin is warm.     Capillary Refill: Capillary refill takes less than 2 seconds.  Neurological:     General: No focal deficit present.     Mental Status: She is alert and oriented to person, place, and time.     Gait: Gait abnormal.  Psychiatric:        Mood and Affect: Mood normal.     Labs reviewed: Basic Metabolic Panel: Recent Labs    07/27/23 1920 09/05/23 1641 10/26/23 0904  NA 137 135 139  K 4.2 3.1* 3.4*  CL 105 101 105  CO2 21* 24 22  GLUCOSE 92 122* 98  BUN 13 9 12   CREATININE 0.53 0.59 0.47  CALCIUM  9.0 9.0 8.7*   Liver Function Tests: Recent Labs    04/10/23 1039 07/17/23 0955 09/05/23 1641 10/26/23 0904  AST 15 14  22 19  ALT 13 9 17 15   ALKPHOS 52  --  55 59  BILITOT 0.9 0.4 0.7 0.8  PROT 7.0 7.1 7.5 7.1  ALBUMIN 3.8  --  4.0 3.8   No results for input(s): LIPASE, AMYLASE in the last 8760 hours. No results for input(s): AMMONIA in the last 8760 hours. CBC: Recent Labs    07/17/23 0955 07/27/23 1920 09/05/23 1641 10/26/23 0904  WBC 7.9  7.8 5.7 8.0  NEUTROABS 4,930  --  4.1 4.4  HGB 11.9 11.2* 11.1* 11.1*  HCT 37.6 35.0* 35.9* 34.6*  MCV 86.8 86.4 89.3 90.8  PLT 277 141* 184 230   Lipid Panel: Recent Labs    02/16/23 1419 07/17/23 0955  CHOL 168 180  HDL 52 63  LDLCALC 101* 101*  TRIG 60 71  CHOLHDL 3.2 2.9   TSH: No results for input(s): TSH in the last 8760 hours. A1C: Lab Results  Component Value Date   HGBA1C 5.6 10/30/2021     Assessment/Plan 1. Floaters in visual field, right (Primary) - onset 2 weeks ago - no eye injury, pain or vision loss - right eye exam unremarkable - advised to see ophthalmologist  2. Primary hypertension - 05/12 metoprolol  prescribed - home BP machine with higher reading than in office - she plans to purchase new machine - advised to take BP BID x 14 days> bring readings next visit  - discussed low sodium diet  Total time: 31 minutes. Greater than 50% of total time spent doing patient education regarding right eye floater and elevated blood pressure including symptom/medication management.     Next appt: Visit date not found  Victorio Creeden Gil BODILY  [facility] [phone]

## 2024-02-04 NOTE — Patient Instructions (Addendum)
 [facility]> [facility]> [phone] [facility]   Please schedule with eye specialist ASAP  Please take blood pressure twice daily x 14 days   Continue low sodium diet, under 2000 mg daily

## 2024-02-09 ENCOUNTER — Ambulatory Visit: Admitting: Orthopedic Surgery

## 2024-02-09 ENCOUNTER — Other Ambulatory Visit: Payer: Self-pay | Admitting: Orthopedic Surgery

## 2024-02-09 DIAGNOSIS — H43391 Other vitreous opacities, right eye: Secondary | ICD-10-CM

## 2024-02-11 ENCOUNTER — Telehealth: Payer: Self-pay

## 2024-02-11 DIAGNOSIS — H43391 Other vitreous opacities, right eye: Secondary | ICD-10-CM

## 2024-02-11 NOTE — Telephone Encounter (Signed)
Mychart message sent to patient with providers response.

## 2024-02-11 NOTE — Telephone Encounter (Signed)
 Called and spoke to [place] after I had called Dr. Maude Bring regarding her right eye symptoms and insurance. Patient said she had already called [facility] and is scheduled for Monday, 02/15/24 at 9:30 am.  I asked if they need her referral and notes faxed and Earlee said she asked [facility] that question and they said they do not need the referral for her to have her appointment Monday. Mertie thanked me for my call and for my efforts to assist her.

## 2024-02-11 NOTE — Telephone Encounter (Signed)
 Copied from CRM #[id number]. Topic: Referral - Question >> Feb 11, 2024 10:56 AM Diannia H wrote: Reason for CRM: Patient was calling to see if she could see a provider in [place] because the original referral that was sent the office doesn't see patients with her issue as far as her eyes, they were going to send her to [facility] but she doesn't have transportation, she is needing to see a eye doctor asap because her eyes are only getting worse and are bothering her, could you please assist? Patients callback number is [phone].

## 2024-02-11 NOTE — Telephone Encounter (Signed)
 We have made a new referral and our referral coordinator is involved. Unfortunately we have no control with appointment availability. I gave a list of some practices in town your last visit> see below. I would also encourage looking for other eye doctors besides what is on that list. Also see if they have a cancellation list as well.   [facility]> [facility]> [phone] [facility]  [facility]

## 2024-02-15 DIAGNOSIS — H04123 Dry eye syndrome of bilateral lacrimal glands: Secondary | ICD-10-CM | POA: Diagnosis not present

## 2024-02-15 DIAGNOSIS — H2513 Age-related nuclear cataract, bilateral: Secondary | ICD-10-CM | POA: Diagnosis not present

## 2024-02-15 DIAGNOSIS — H43811 Vitreous degeneration, right eye: Secondary | ICD-10-CM | POA: Diagnosis not present

## 2024-02-15 DIAGNOSIS — H0288B Meibomian gland dysfunction left eye, upper and lower eyelids: Secondary | ICD-10-CM | POA: Diagnosis not present

## 2024-02-15 DIAGNOSIS — H0288A Meibomian gland dysfunction right eye, upper and lower eyelids: Secondary | ICD-10-CM | POA: Diagnosis not present

## 2024-02-16 ENCOUNTER — Ambulatory Visit: Attending: Cardiology | Admitting: Cardiology

## 2024-02-16 ENCOUNTER — Encounter: Payer: Self-pay | Admitting: Cardiology

## 2024-02-16 VITALS — BP 177/98 | HR 64 | Resp 16 | Ht 63.0 in | Wt 200.0 lb

## 2024-02-16 DIAGNOSIS — I5032 Chronic diastolic (congestive) heart failure: Secondary | ICD-10-CM

## 2024-02-16 DIAGNOSIS — E66812 Obesity, class 2: Secondary | ICD-10-CM

## 2024-02-16 DIAGNOSIS — I4819 Other persistent atrial fibrillation: Secondary | ICD-10-CM | POA: Diagnosis not present

## 2024-02-16 DIAGNOSIS — Z7901 Long term (current) use of anticoagulants: Secondary | ICD-10-CM

## 2024-02-16 DIAGNOSIS — E782 Mixed hyperlipidemia: Secondary | ICD-10-CM | POA: Diagnosis not present

## 2024-02-16 DIAGNOSIS — Z6835 Body mass index (BMI) 35.0-35.9, adult: Secondary | ICD-10-CM

## 2024-02-16 MED ORDER — SACUBITRIL-VALSARTAN 49-51 MG PO TABS: 1.0000 | ORAL_TABLET | Freq: Two times a day (BID) | ORAL | 3 refills | Status: DC

## 2024-02-16 NOTE — Patient Instructions (Addendum)
 Medication Instructions:  INCREASE Entresto  to 49/51 mg twice daily  *If you need a refill on your cardiac medications before your next appointment, please call your pharmacy*  Lab Work: To be completed in 1 week: BMP and Pro-BNP  If you have labs (blood work) drawn today and your tests are completely normal, you will receive your results only by: MyChart Message (if you have MyChart) OR A paper copy in the mail If you have any lab test that is abnormal or we need to change your treatment, we will call you to review the results.  Testing/Procedures: None ordered today.  Follow-Up: At [facility], you and your health needs are our priority.  As part of our continuing mission to provide you with exceptional heart care, our providers are all part of one team.  This team includes your primary Cardiologist (physician) and Advanced Practice Providers or APPs (Physician Assistants and Nurse Practitioners) who all work together to provide you with the care you need, when you need it.  Your next appointment:   6 month(s)  Provider:   Madonna Large, DO    We recommend signing up for the patient portal called MyChart.  Sign up information is provided on this After Visit Summary.  MyChart is used to connect with patients for Virtual Visits (Telemedicine).  Patients are able to view lab/test results, encounter notes, upcoming appointments, etc.  Non-urgent messages can be sent to your provider as well.   To learn more about what you can do with MyChart, go to [email or web address].   Other Instructions You have been referred to PharmD to meet with our pharmacists for GDMT medication management.

## 2024-02-16 NOTE — Progress Notes (Signed)
 Cardiology Office Note:  .   Date:  02/16/2024  ID:  [name] [name], DOB 13-Feb-1961, MRN [id number] PCP:  [name], NP  Former Cardiology Providers: None [facility] Providers Cardiologist:  Madonna Large, DO , [facility] (established care 10/28/2021) Electrophysiologist:  None  Click to update primary MD,subspecialty MD or APP then REFRESH:1}    Chief Complaint  Patient presents with   Heart failure with improved ejection fraction   Follow-up    History of Present Illness: .   [name] [name] is a 63 y.o. African-American female whose past medical history and cardiovascular risk factors includes: Persistent atrial fibrillation status post direct-current cardioversion, aortic atherosclerosis, hypertension, asthma, heart failure with improved EF / HFpEF,  sleep apnea not on CPAP, COPD, history of alcohol  abuse, postmenopausal female.   Patient being followed by the practice given her history of persistent atrial fibrillation, and heart failure with improved EF.  During her prior hospitalization she was in A-fib with RVR underwent direct-current cardioversion which is restored normal sinus rhythm and post cardioversion patient's LVEF has improved from 25-30% to 60-65% with grade 2 diastolic dysfunction.  Since last office visit patient underwent dental extractions.  She presents today for 3-month follow-up visit.  Denies anginal chest pain or heart failure symptoms. Office and home blood pressures are also not well-controlled. In the past patient discontinued Farxiga  as she thought it was for diabetic patients.  In addition, patient states that she does not want to restart it as it did not make her feel right.  Review of Systems: .   Review of Systems  Cardiovascular:  Negative for chest pain, claudication, irregular heartbeat, leg swelling, near-syncope, orthopnea, palpitations, paroxysmal nocturnal dyspnea and syncope.  Respiratory:  Negative for shortness of breath.    Hematologic/Lymphatic: Negative for bleeding problem.    Studies Reviewed:   EKG: EKG Interpretation Date/Time:  Tuesday February 16 2024 10:19:37 EDT Ventricular Rate:  66 PR Interval:  150 QRS Duration:  102 QT Interval:  410 QTC Calculation: 429 R Axis:   75  Text Interpretation: Sinus rhythm with marked sinus arrhythmia Minimal voltage criteria for LVH, may be normal variant ( Cornell product ) When compared with ECG of 26-Oct-2023 09:01, No significant change since last tracing Confirmed by Large Madonna [phone]) on 02/16/2024 10:54:42 AM  Echocardiogram: 03/19/2021: LVEF 60-65%, moderate LVH, grade 2 diastolic dysfunction, elevated LAP, moderately dilated left atrium, mild MR, mild AR, estimated RAP 15 mmHg.   10/31/2020 TEE + DDCV: LVEF 25-30%, severely reduced, mild LVH, regional wall motion abnormality, RV function low normal, size is normal, no left atrial appendage thrombi, mild to moderate MR, mild plaque in the descending/ascending aorta.  Bubble study negative.  EKG during visit TEE A-fib with RVR.  Underwent direct-current cardioversion 200 J synchronized x1 restored NSR.   11/01/2020:  1. Left ventricular ejection fraction, by estimation, is 50 to 55%. The  left ventricle has low normal function. The left ventricle has no regional  wall motion abnormalities. There is mild left ventricular hypertrophy.  Left ventricular diastolic  parameters are consistent with Grade III diastolic dysfunction  (restrictive). Elevated left atrial pressure.   2. Right ventricular systolic function is normal. The right ventricular  size is normal. There is mildly elevated pulmonary artery systolic  pressure.   3. Left atrial size was mildly dilated.   4. The mitral valve is grossly normal. Mild mitral valve regurgitation.  No evidence of mitral stenosis.   5. The aortic valve is  grossly normal. Aortic valve regurgitation is  mild. No aortic stenosis is present.   6. The inferior vena cava is  normal in size with greater than 50%  respiratory variability, suggesting right atrial pressure of 3 mmHg. 7. While in NSR.     Stress Testing: Regadenoson  Nuclear stress test 11/13/2022: Normal myocardial perfusion with mild apical thinning. Overall LV systolic function is normal without regional wall motion abnormalities. Stress LV EF: 59%.  Non-diagnostic ECG stress. The heart rate response was consistent with Regadenoson .  No previous exam available for comparison.  Low risk.   RADIOLOGY: NA  Risk Assessment/Calculations:   Click Here to Calculate/Change CHADS2VASc Score The patient's CHADS2-VASc score is 4, indicating a 4.8% annual risk of stroke. CHF History: Yes HTN History: Yes Diabetes History: No Stroke History: No Vascular Disease History: Yes  Labs:       Latest Ref Rng & Units 10/26/2023    9:04 AM 09/05/2023    4:41 PM 07/27/2023    7:20 PM  CBC  WBC 4.0 - 10.5 K/uL 8.0  5.7  7.8   Hemoglobin 12.0 - 15.0 g/dL 88.8  88.8  88.7   Hematocrit 36.0 - 46.0 % 34.6  35.9  35.0   Platelets 150 - 400 K/uL 230  184  141        Latest Ref Rng & Units 10/26/2023    9:04 AM 09/05/2023    4:41 PM 07/27/2023    7:20 PM  BMP  Glucose 70 - 99 mg/dL 98  877  92   BUN 8 - 23 mg/dL 12  9  13    Creatinine 0.44 - 1.00 mg/dL 9.52  9.40  9.46   Sodium 135 - 145 mmol/L 139  135  137   Potassium 3.5 - 5.1 mmol/L 3.4  3.1  4.2   Chloride 98 - 111 mmol/L 105  101  105   CO2 22 - 32 mmol/L 22  24  21    Calcium  8.9 - 10.3 mg/dL 8.7  9.0  9.0       Latest Ref Rng & Units 10/26/2023    9:04 AM 09/05/2023    4:41 PM 07/27/2023    7:20 PM  CMP  Glucose 70 - 99 mg/dL 98  877  92   BUN 8 - 23 mg/dL 12  9  13    Creatinine 0.44 - 1.00 mg/dL 9.52  9.40  9.46   Sodium 135 - 145 mmol/L 139  135  137   Potassium 3.5 - 5.1 mmol/L 3.4  3.1  4.2   Chloride 98 - 111 mmol/L 105  101  105   CO2 22 - 32 mmol/L 22  24  21    Calcium  8.9 - 10.3 mg/dL 8.7  9.0  9.0   Total Protein 6.5 - 8.1 g/dL 7.1  7.5     Total Bilirubin 0.0 - 1.2 mg/dL 0.8  0.7    Alkaline Phos 38 - 126 U/L 59  55    AST 15 - 41 U/L 19  22    ALT 0 - 44 U/L 15  17      Lab Results  Component Value Date   CHOL 180 07/17/2023   HDL 63 07/17/2023   LDLCALC 101 (H) 07/17/2023   TRIG 71 07/17/2023   CHOLHDL 2.9 07/17/2023   No results for input(s): LIPOA in the last 8760 hours. No components found for: NTPROBNP No results for input(s): PROBNP in the last 8760 hours. No results for  input(s): TSH in the last 8760 hours.   Physical Exam:    Today's Vitals   02/16/24 1016  BP: (!) 177/98  Pulse: 64  Resp: 16  SpO2: 95%  Weight: 200 lb (90.7 kg)  Height: 5' 3 (1.6 m)   Body mass index is 35.43 kg/m. Wt Readings from Last 3 Encounters:  02/16/24 200 lb (90.7 kg)  02/04/24 207 lb 9.6 oz (94.2 kg)  11/26/23 205 lb 12.8 oz (93.4 kg)    Physical Exam  Constitutional: No distress.  Age appropriate, hemodynamically stable.   Neck: No JVD present.  Cardiovascular: Normal rate, regular rhythm, S1 normal, S2 normal, intact distal pulses and normal pulses. Exam reveals no gallop, no S3 and no S4.  No murmur heard. Pulmonary/Chest: Effort normal and breath sounds normal. No stridor. She has no wheezes. She has no rales.  Abdominal: Soft. Bowel sounds are normal. She exhibits no distension. There is no abdominal tenderness.  Musculoskeletal:        General: No edema.     Cervical back: Neck supple.  Neurological: She is alert and oriented to person, place, and time. She has intact cranial nerves (2-12).  Skin: Skin is warm and moist.   Impression & Recommendation(s):  Impression:   ICD-10-CM   1. Heart failure with improved ejection fraction (HFimpEF) (HCC)  I50.32 EKG 12-Lead    Basic metabolic panel with GFR    Pro b natriuretic peptide (BNP)    AMB Referral to [facility] Pharm-D    Pro b natriuretic peptide (BNP)    Basic metabolic panel with GFR    2. Persistent atrial fibrillation (HCC)  I48.19      3. Long term (current) use of anticoagulants  Z79.01     4. Mixed hyperlipidemia  E78.2     5. Class 2 severe obesity due to excess calories with serious comorbidity and body mass index (BMI) of 36.0 to 36.9 in adult [facility])  Z33.187    E66.01    Z68.36     6. Chronic diastolic CHF (congestive heart failure) (HCC)  I50.32        Recommendation(s):  Heart failure with improved ejection fraction (HFimpEF) (HCC) Stage C, NYHA class II. April 2023: LVEF 25-30%, underwent DCCV, and follow-up echocardiogram noted LVEF of 50-55% No reoccurrence of heart failure hospitalization. Currently on Entresto  24/26 mg p.o. twice daily, transition to 49/51 mg p.o. twice daily Was on Farxiga  in the past, patient discontinued it thinking that it was for diabetic population.  Recommended restarting Farxiga  but patient states that the medication did not make her feel right and therefore would like to hold off. Did not tolerate BiDil in the past secondary to vision changes/headaches. Will refer to Pharm.D. clinic for close monitoring and up titration of GDMT.  BMP in one week to check renal function and electrolytes  Persistent atrial fibrillation (HCC) EKG illustrates sinus rhythm with sinus arrhythmia Underwent TEE guided cardioversion in April 2023. Currently on metoprolol  for rate control strategy. Continue Eliquis  for thromboembolic prophylaxis.  Long term (current) use of anticoagulants Currently on Eliquis  for thromboembolic prophylaxis. CHA2DS2-VASc SCORE as noted above. Hemoglobin 11.1 g/dL as of April 2025  Mixed hyperlipidemia Currently on Lipitor 20 mg p.o. daily.   She denies myalgia or other side effects. Most recent lipids dated 06/2023, independently reviewed as noted above.  LDL is 101mg /dL.  Cardiology is following peripherally.  Class 1 obesity due to excess calories with serious comorbidity and body mass index (BMI) of 35.0  to 35.9 in adult Body mass index is 35.43 kg/m. I  reviewed with her importance of diet, regular physical activity/exercise, weight loss.   Patient is educated on the importance of increasing physical activity gradually as tolerated with a goal of moderate intensity exercise for 30 minutes a day 5 days a week.  Orders Placed:  Orders Placed This Encounter  Procedures   Basic metabolic panel with GFR    Standing Status:   Future    Number of Occurrences:   1    Expected Date:   02/23/2024    Expiration Date:   02/15/2025   Pro b natriuretic peptide (BNP)    Standing Status:   Future    Number of Occurrences:   1    Expected Date:   02/23/2024    Expiration Date:   02/15/2025   AMB Referral to [facility] Pharm-D    Referral Priority:   Routine    Referral Type:   Consultation    Referral Reason:   Specialty Services Required    Number of Visits Requested:   1   EKG 12-Lead    Final Medication List:    Meds ordered this encounter  Medications   sacubitril -valsartan  (ENTRESTO ) 49-51 MG    Sig: Take 1 tablet by mouth 2 (two) times daily.    Dispense:  60 tablet    Refill:  3    Increasing to 49/51 mg    Medications Discontinued During This Encounter  Medication Reason   ENTRESTO  24-26 MG Dose change      Current Outpatient Medications:    albuterol  (PROVENTIL ) (2.5 MG/3ML) 0.083% nebulizer solution, Take 3 mLs (2.5 mg total) by nebulization every 6 (six) hours as needed for wheezing or shortness of breath., Disp: 150 mL, Rfl: 6   albuterol  (VENTOLIN  HFA) 108 (90 Base) MCG/ACT inhaler, Inhale 2 puffs into the lungs every 6 (six) hours as needed for wheezing or shortness of breath., Disp: 8.5 g, Rfl: 11   atorvastatin  (LIPITOR) 20 MG tablet, TAKE 1 TABLET (20 MG TOTAL) BY MOUTH AT BEDTIME FOR CHOLESTEROL, Disp: 90 tablet, Rfl: 1   Bioflavonoid Products (VITAMIN C PLUS PO), Take 1 tablet by mouth daily., Disp: , Rfl:    Blood Pressure Monitoring (BLOOD PRESSURE CUFF) MISC, To take blood pressure daily 1 hour after medication, Disp: 1  each, Rfl: 0   BREO ELLIPTA  200-25 MCG/ACT AEPB, TAKE 1 PUFF BY MOUTH EVERY DAY, Disp: 60 each, Rfl: 2   cholecalciferol  (VITAMIN D3) 25 MCG (1000 UNIT) tablet, Take 1,000 Units by mouth in the morning., Disp: , Rfl:    ELIQUIS  5 MG TABS tablet, TAKE 1 TABLET BY MOUTH TWICE A DAY, Disp: 180 tablet, Rfl: 1   metoprolol  succinate (TOPROL -XL) 25 MG 24 hr tablet, TAKE 1 TABLET (25 MG TOTAL) BY MOUTH DAILY., Disp: 30 tablet, Rfl: 3   montelukast  (SINGULAIR ) 10 MG tablet, Take 1 tablet (10 mg total) by mouth at bedtime., Disp: 90 tablet, Rfl: 1   OXYGEN , Inhale 2 L/min into the lungs as needed (wheezing/respiratory issues.)., Disp: , Rfl:    sacubitril -valsartan  (ENTRESTO ) 49-51 MG, Take 1 tablet by mouth 2 (two) times daily., Disp: 60 tablet, Rfl: 3   traZODone  (DESYREL ) 50 MG tablet, TAKE 1 TABLET BY MOUTH EVERYDAY AT BEDTIME, Disp: 30 tablet, Rfl: 3  Consent:   NA  Disposition:   6 month follow up sooner if needed   Her questions and concerns were addressed to her satisfaction. She voices understanding of  the recommendations provided during this encounter.    Signed, Madonna Michele HAS, [facility]  A Division of [facility] [address], [place], [place] [address]  [place], [place] [address]  02/16/2024 1:16 PM

## 2024-02-20 ENCOUNTER — Other Ambulatory Visit: Payer: Self-pay | Admitting: Nurse Practitioner

## 2024-02-20 DIAGNOSIS — F418 Other specified anxiety disorders: Secondary | ICD-10-CM

## 2024-02-23 DIAGNOSIS — J9600 Acute respiratory failure, unspecified whether with hypoxia or hypercapnia: Secondary | ICD-10-CM | POA: Diagnosis not present

## 2024-02-23 DIAGNOSIS — J45909 Unspecified asthma, uncomplicated: Secondary | ICD-10-CM | POA: Diagnosis not present

## 2024-02-25 ENCOUNTER — Encounter: Payer: Self-pay | Admitting: Nurse Practitioner

## 2024-02-25 ENCOUNTER — Ambulatory Visit: Payer: 59 | Admitting: Nurse Practitioner

## 2024-02-25 ENCOUNTER — Telehealth: Payer: Self-pay | Admitting: *Deleted

## 2024-02-25 DIAGNOSIS — Z0000 Encounter for general adult medical examination without abnormal findings: Secondary | ICD-10-CM | POA: Diagnosis not present

## 2024-02-25 NOTE — Telephone Encounter (Signed)
 Ms. terianne, thaker are scheduled for a virtual visit with your provider today.    Just as we do with appointments in the office, we must obtain your consent to participate.  Your consent will be active for this visit and any virtual visit you Madylin Fairbank have with one of our providers in the next 365 days.    If you have a MyChart account, I can also send a copy of this consent to you electronically.  All virtual visits are billed to your insurance company just like a traditional visit in the office.  As this is a virtual visit, video technology does not allow for your provider to perform a traditional examination.  This Alysabeth Scalia limit your provider's ability to fully assess your condition.  If your provider identifies any concerns that need to be evaluated in person or the need to arrange testing such as labs, EKG, etc, we will make arrangements to do so.    Although advances in technology are sophisticated, we cannot ensure that it will always work on either your end or our end.  If the connection with a video visit is poor, we Shaima Sardinas have to switch to a telephone visit.  With either a video or telephone visit, we are not always able to ensure that we have a secure connection.   I need to obtain your verbal consent now.   Are you willing to proceed with your visit today?   LAURABETH YIP has provided verbal consent on 02/25/2024 for a virtual visit (video or telephone).   MayDonzell LABOR, CMA 02/25/2024  9:10 AM

## 2024-02-25 NOTE — Progress Notes (Signed)
  This service is provided via telemedicine  No vital signs collected/recorded due to the encounter was a telemedicine visit.   Location of patient (ex: home, work):  Home  Patient consents to a telephone visit:  Yes  Location of the provider (ex: office, home):  Office [place].   Name of any referring provider:  na  Names of all persons participating in the telemedicine service and their role in the encounter:  [name] [name], Patient, Donzell Beal, CMA, [name]  Time spent on call:  7:11

## 2024-02-25 NOTE — Progress Notes (Signed)
 Subjective:   [name] [name] is a 63 y.o. female who presents for Medicare Annual (Subsequent) preventive examination.  Visit Complete: Virtual I connected with  [name] [name] on 02/25/24 by a video and audio enabled telemedicine application and verified that I am speaking with the correct person using two identifiers.  Patient Location: Home  Provider Location: Home Office  I discussed the limitations of evaluation and management by telemedicine. The patient expressed understanding and agreed to proceed.  Vital Signs: Because this visit was a virtual/telehealth visit, some criteria may be missing or patient reported. Any vitals not documented were not able to be obtained and vitals that have been documented are patient reported.   Cardiac Risk Factors include: advanced age (>60men, >46 women);hypertension;dyslipidemia;sedentary lifestyle;obesity (BMI >30kg/m2)     Objective:    Today's Vitals   02/25/24 9076  PainSc: 7    There is no height or weight on file to calculate BMI.     02/25/2024    9:04 AM 10/26/2023    8:56 AM 10/16/2023   10:50 AM 08/04/2023    9:16 AM 04/22/2023   12:00 PM 04/20/2023    9:15 AM 04/10/2023   10:23 AM  Advanced Directives  Does Patient Have a Medical Advance Directive? Yes Yes Yes No Yes Yes No  Type of Advance Directive Out of facility DNR (pink MOST or yellow form) Healthcare Power of [place];Living will Healthcare Power of [place];Living will  Living will Out of facility DNR (pink MOST or yellow form)   Does patient want to make changes to medical advance directive? No - Patient declined   No - Patient declined No - Patient declined No - Patient declined   Copy of Healthcare Power of Attorney in Chart?   No - copy requested      Would patient like information on creating a medical advance directive?       No - Patient declined  Pre-existing out of facility DNR order (yellow form or pink MOST form)      Pink MOST form placed in chart (order not  valid for inpatient use)     Current Medications (verified) Outpatient Encounter Medications as of 02/25/2024  Medication Sig   albuterol  (PROVENTIL ) (2.5 MG/3ML) 0.083% nebulizer solution Take 3 mLs (2.5 mg total) by nebulization every 6 (six) hours as needed for wheezing or shortness of breath.   albuterol  (VENTOLIN  HFA) 108 (90 Base) MCG/ACT inhaler Inhale 2 puffs into the lungs every 6 (six) hours as needed for wheezing or shortness of breath.   atorvastatin  (LIPITOR) 20 MG tablet TAKE 1 TABLET (20 MG TOTAL) BY MOUTH AT BEDTIME FOR CHOLESTEROL   Bioflavonoid Products (VITAMIN C PLUS PO) Take 1 tablet by mouth daily.   Blood Pressure Monitoring (BLOOD PRESSURE CUFF) MISC To take blood pressure daily 1 hour after medication   BREO ELLIPTA  200-25 MCG/ACT AEPB TAKE 1 PUFF BY MOUTH EVERY DAY   cholecalciferol  (VITAMIN D3) 25 MCG (1000 UNIT) tablet Take 1,000 Units by mouth in the morning.   ELIQUIS  5 MG TABS tablet TAKE 1 TABLET BY MOUTH TWICE A DAY   metoprolol  succinate (TOPROL -XL) 25 MG 24 hr tablet TAKE 1 TABLET (25 MG TOTAL) BY MOUTH DAILY.   montelukast  (SINGULAIR ) 10 MG tablet Take 1 tablet (10 mg total) by mouth at bedtime.   OXYGEN  Inhale 2 L/min into the lungs as needed (wheezing/respiratory issues.).   sacubitril -valsartan  (ENTRESTO ) 49-51 MG Take 1 tablet by mouth 2 (two) times daily.  traZODone  (DESYREL ) 50 MG tablet TAKE 1 TABLET BY MOUTH EVERYDAY AT BEDTIME   No facility-administered encounter medications on file as of 02/25/2024.    Allergies (verified) Sodium ferric gluconate [ferrous gluconate], Imdur [isosorbide nitrate], and Tetracyclines & related   History: Past Medical History:  Diagnosis Date   Anemia    Anxiety    Arthritis    Asthma    Bloody stool    Chronic diastolic heart failure (HCC)    COPD (chronic obstructive pulmonary disease) (HCC)    Dental decay    Dysrhythmia    Atrial flutter   Gastritis, Helicobacter pylori 06/22/2015   Hypertension     Paroxysmal atrial fibrillation (HCC)    Pneumonia 06/21/2015   Prepyloric ulcer 06/22/2015   Seasonal allergies    Sleep apnea    no CPAP   Past Surgical History:  Procedure Laterality Date   BARTHOLIN CYST MARSUPIALIZATION Left 07/22/1995   BIOPSY  11/04/2021   Procedure: BIOPSY;  Surgeon: Saintclair Jasper, MD;  Location: [facility];  Service: Gastroenterology;;   VASSIE STUDY  10/31/2021   Procedure: BUBBLE STUDY;  Surgeon: Michele Richardson, DO;  Location: [facility];  Service: Cardiovascular;;   CARDIAC SURGERY N/A 07/21/1961   Repair of PFO   CARDIOVERSION N/A 10/31/2021   Procedure: CARDIOVERSION;  Surgeon: Michele Richardson, DO;  Location: [facility];  Service: Cardiovascular;  Laterality: N/A;   COLONOSCOPY N/A 07/21/2000   Performed secondary to mother's diagnosis of colon CA at age 10   COLONOSCOPY WITH PROPOFOL  N/A 11/04/2021   Procedure: COLONOSCOPY WITH PROPOFOL ;  Surgeon: Saintclair Jasper, MD;  Location: [facility];  Service: Gastroenterology;  Laterality: N/A;   ESOPHAGOGASTRODUODENOSCOPY (EGD) WITH PROPOFOL  N/A 06/22/2015   Procedure: ESOPHAGOGASTRODUODENOSCOPY (EGD) WITH PROPOFOL ;  Surgeon: Elsie Cree, MD;  Location[facility] ENDOSCOPY;  Service: Endoscopy;  Laterality: N/A;   ESOPHAGOGASTRODUODENOSCOPY (EGD) WITH PROPOFOL  N/A 11/04/2021   Procedure: ESOPHAGOGASTRODUODENOSCOPY (EGD) WITH PROPOFOL ;  Surgeon: Saintclair Jasper, MD;  Location: [facility];  Service: Gastroenterology;  Laterality: N/A;   FRACTURE SURGERY Left 2005   ORIF tibia   NASAL SINUS SURGERY Bilateral 07/21/1982   OVARIAN CYST REMOVAL Left 07/21/1988   POLYPECTOMY  11/04/2021   Procedure: POLYPECTOMY;  Surgeon: Saintclair Jasper, MD;  Location: [facility];  Service: Gastroenterology;;   TEE WITHOUT CARDIOVERSION N/A 10/31/2021   Procedure: TRANSESOPHAGEAL ECHOCARDIOGRAM (TEE);  Surgeon: Michele Richardson, DO;  Location: [facility];  Service: Cardiovascular;  Laterality: N/A;   TOTAL HIP ARTHROPLASTY Right 04/22/2023    Procedure: TOTAL HIP ARTHROPLASTY ANTERIOR APPROACH;  Surgeon: Fidel Rogue, MD;  Location: [facility];  Service: Orthopedics;  Laterality: Right;  140   TUBAL LIGATION     UMBILICAL HERNIA REPAIR N/A 07/22/2003   Family History  Problem Relation Age of Onset   Colon cancer Mother 5   Hypertension Mother    Cancer Father 48       liver cancer   Hypertension Father    Cerebral aneurysm Sister    Goiter Sister    Breast cancer Neg Hx    Social History   Socioeconomic History   Marital status: Divorced    Spouse name: Not on file   Number of children: 4   Years of education: Not on file   Highest education level: Some college, no degree  Occupational History   Occupation: Veterinary surgeon at [employer]    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  Tobacco Use   Smoking status: Never   Smokeless tobacco: Never  Vaping Use  Vaping status: Never Used  Substance and Sexual Activity   Alcohol  use: Not Currently    Comment: previously 3-4 beers per day, Stopped drinking in 2023   Drug use: No   Sexual activity: Yes  Other Topics Concern   Not on file  Social History Narrative   [place] in [place] , but grew up in [place]   Was homeless Sept. 2016, when initially established   Now has own apartment, lives alone   Brother and 2 sons live in town, improving relationships.   States ended up homeless due to difficulty with living situation when children moved to [place] to live with her--she ended up leaving to get away from them and did not have a job.     Not ready to discuss details   Social Drivers of Health   Financial Resource Strain: Low Risk  (02/25/2024)   Overall Financial Resource Strain (CARDIA)    Difficulty of Paying Living Expenses: Not hard at all  Food Insecurity: No Food Insecurity (02/25/2024)   Hunger Vital Sign    Worried About Running Out of Food in the Last Year: Never true    Ran Out of Food in the Last Year: Never true   Transportation Needs: No Transportation Needs (02/25/2024)   PRAPARE - Administrator, Civil Service (Medical): No    Lack of Transportation (Non-Medical): No  Physical Activity: Inactive (02/25/2024)   Exercise Vital Sign    Days of Exercise per Week: 0 days    Minutes of Exercise per Session: Not on file  Stress: No Stress Concern Present (02/25/2024)   [employer] of Occupational Health - Occupational Stress Questionnaire    Feeling of Stress: Not at all  Social Connections: Moderately Integrated (02/25/2024)   Social Connection and Isolation Panel    Frequency of Communication with Friends and Family: More than three times a week    Frequency of Social Gatherings with Friends and Family: Three times a week    Attends Religious Services: More than 4 times per year    Active Member of Clubs or Organizations: Yes    Attends Engineer, structural: More than 4 times per year    Marital Status: Divorced    Tobacco Counseling Counseling given: Not Answered   Clinical Intake:  Pre-visit preparation completed: Yes  Pain : 0-10 Pain Score: 7  Pain Type: Chronic pain Pain Location: Knee Pain Orientation: Right, Left Pain Descriptors / Indicators: Aching Pain Onset: More than a month ago Pain Frequency: Constant     BMI - recorded: 36 Nutritional Status: BMI > 30  Obese Nutritional Risks: None  How often do you need to have someone help you when you read instructions, pamphlets, or other written materials from your doctor or pharmacy?: 1 - Never         Activities of Daily Living    02/25/2024    9:18 AM 04/22/2023   12:00 PM  In your present state of health, do you have any difficulty performing the following activities:  Hearing? 0 0  Vision? 1 0  Comment up to date with eye doctor appt   Difficulty concentrating or making decisions? 0 0  Walking or climbing stairs? 1   Dressing or bathing? 0   Doing errands, shopping? 0 0  Preparing Food and  eating ? N   Using the Toilet? N   In the past six months, have you accidently leaked urine? Y   Do you have problems with loss  of bowel control? N   Managing your Medications? N   Managing your Finances? N   Housekeeping or managing your Housekeeping? N     Patient Care Team: [name], NP as PCP - General (Geriatric Medicine) Michele Richardson, DO as PCP - Cardiology (Cardiology)  Indicate any recent Medical Services you may have received from other than [facility] providers in the past year (date may be approximate).     Assessment:   This is a routine wellness examination for Ettie.  Hearing/Vision screen Vision Screening - Comments:: [facility] Last Exam; 02/16/2024   Goals Addressed   None    Depression Screen    02/25/2024    9:03 AM 11/30/2023   10:37 AM 11/30/2023   10:30 AM 10/16/2023   10:49 AM 04/20/2023   10:42 AM 02/19/2023    9:39 AM 02/16/2023    1:44 PM  PHQ 2/9 Scores  PHQ - 2 Score 0 0 0 0  0   PHQ- 9 Score  0       Exception Documentation     Other- indicate reason in comment box  Other- indicate reason in comment box  Not completed     Under the care of a therapist  Seeing a therapist    Fall Risk    02/25/2024    9:03 AM 11/30/2023   10:30 AM 11/26/2023    8:54 AM 10/16/2023   10:49 AM 07/31/2023   10:59 AM  Fall Risk   Falls in the past year? 0 0 0 0 0  Number falls in past yr: 0 0  0 0  Injury with Fall? 0 0  0 0  Risk for fall due to : No Fall Risks No Fall Risks  No Fall Risks No Fall Risks  Follow up Falls evaluation completed Falls prevention discussed;Falls evaluation completed  Falls evaluation completed Falls evaluation completed    MEDICARE RISK AT HOME: Medicare Risk at Home Any stairs in or around the home?: Yes If so, are there any without handrails?: Yes Home free of loose throw rugs in walkways, pet beds, electrical cords, etc?: Yes Adequate lighting in your home to reduce risk of falls?: Yes Life alert?: No Use of a cane, walker  or w/c?: Yes Grab bars in the bathroom?: No Shower chair or bench in shower?: Yes Elevated toilet seat or a handicapped toilet?: Yes  TIMED UP AND GO:  Was the test performed?  No    Cognitive Function:        02/19/2023    9:40 AM  6CIT Screen  What Year? 0 points  What month? 0 points  What time? 0 points  Count back from 20 0 points  Months in reverse 0 points  Repeat phrase 2 points  Total Score 2 points    Immunizations Immunization History  Administered Date(s) Administered   PPD Test 03/12/2015   Tdap 03/27/2015    TDAP status: Up to date  Flu Vaccine status: Due, Education has been provided regarding the importance of this vaccine. Advised may receive this vaccine at local pharmacy or Health Dept. Aware to provide a copy of the vaccination record if obtained from local pharmacy or Health Dept. Verbalized acceptance and understanding.  Pneumococcal vaccine status: Due, Education has been provided regarding the importance of this vaccine. Advised may receive this vaccine at local pharmacy or Health Dept. Aware to provide a copy of the vaccination record if obtained from local pharmacy or Health Dept. Verbalized acceptance  and understanding.  Covid-19 vaccine status: Information provided on how to obtain vaccines.   Qualifies for Shingles Vaccine? Yes   Zostavax completed No   Shingrix Completed?: Yes  Screening Tests Health Maintenance  Topic Date Due   Pneumococcal Vaccine: 19-49 Years (1 of 2 - PCV) Never done   Pneumococcal Vaccine: 50+ Years (1 of 2 - PCV) Never done   Cervical Cancer Screening (HPV/Pap Cotest)  Never done   INFLUENZA VACCINE  02/19/2024   Zoster Vaccines- Shingrix (1 of 2) 07/21/2048 (Originally 02/20/2011)   Medicare Annual Wellness (AWV)  02/24/2025   DTaP/Tdap/Td (2 - Td or Tdap) 03/26/2025   MAMMOGRAM  10/05/2025   Colonoscopy  11/05/2031   Hepatitis C Screening  Completed   HIV Screening  Completed   Hepatitis B Vaccines  Aged Out    HPV VACCINES  Aged Out   Meningococcal B Vaccine  Aged Out   COVID-19 Vaccine  Discontinued    Health Maintenance  Health Maintenance Due  Topic Date Due   Pneumococcal Vaccine: 19-49 Years (1 of 2 - PCV) Never done   Pneumococcal Vaccine: 50+ Years (1 of 2 - PCV) Never done   Cervical Cancer Screening (HPV/Pap Cotest)  Never done   INFLUENZA VACCINE  02/19/2024    Colorectal cancer screening: Type of screening: Colonoscopy. Completed 2023. Repeat every 10 years  Mammogram status: Completed 10/06/2023. Repeat every year  Bone density starting at 65  Lung Cancer Screening: (Low Dose CT Chest recommended if Age 58-80 years, 20 pack-year currently smoking OR have quit w/in 15years.) does not qualify.   Lung Cancer Screening Referral: na  Additional Screening:  Hepatitis C Screening: does qualify; Completed na  Vision Screening: Recommended annual ophthalmology exams for early detection of glaucoma and other disorders of the eye. Is the patient up to date with their annual eye exam?  Yes  Who is the provider or what is the name of the office in which the patient attends annual eye exams? [facility]  If pt is not established with a provider, would they like to be referred to a provider to establish care? No .   Dental Screening: Recommended annual dental exams for proper oral hygiene   Community Resource Referral / Chronic Care Management: CRR required this visit?  No   CCM required this visit?  No     Plan:     I have personally reviewed and noted the following in the patient's chart:   Medical and social history Use of alcohol , tobacco or illicit drugs  Current medications and supplements including opioid prescriptions. Patient is not currently taking opioid prescriptions. Functional ability and status Nutritional status Physical activity Advanced directives List of other physicians Hospitalizations, surgeries, and ER visits in previous 12 months Vitals Screenings  to include cognitive, depression, and falls Referrals and appointments  In addition, I have reviewed and discussed with patient certain preventive protocols, quality metrics, and best practice recommendations. A written personalized care plan for preventive services as well as general preventive health recommendations were provided to patient.     Harlene MARLA An, NP   02/25/2024   After Visit Summary: (MyChart) Due to this being a telephonic visit, the after visit summary with patients personalized plan was offered to patient via MyChart

## 2024-02-26 ENCOUNTER — Ambulatory Visit: Payer: Self-pay

## 2024-02-26 ENCOUNTER — Other Ambulatory Visit: Payer: Self-pay

## 2024-02-26 ENCOUNTER — Emergency Department (HOSPITAL_COMMUNITY)
Admission: EM | Admit: 2024-02-26 | Discharge: 2024-02-26 | Disposition: A | Discharge status: Home / Self Care | Attending: Emergency Medicine | Admitting: Emergency Medicine

## 2024-02-26 ENCOUNTER — Emergency Department (HOSPITAL_COMMUNITY)

## 2024-02-26 DIAGNOSIS — Z7901 Long term (current) use of anticoagulants: Secondary | ICD-10-CM | POA: Diagnosis not present

## 2024-02-26 DIAGNOSIS — J4489 Other specified chronic obstructive pulmonary disease: Secondary | ICD-10-CM | POA: Diagnosis not present

## 2024-02-26 DIAGNOSIS — S0990XA Unspecified injury of head, initial encounter: Secondary | ICD-10-CM | POA: Insufficient documentation

## 2024-02-26 DIAGNOSIS — M25561 Pain in right knee: Secondary | ICD-10-CM | POA: Diagnosis not present

## 2024-02-26 DIAGNOSIS — I110 Hypertensive heart disease with heart failure: Secondary | ICD-10-CM | POA: Insufficient documentation

## 2024-02-26 DIAGNOSIS — M25539 Pain in unspecified wrist: Secondary | ICD-10-CM | POA: Diagnosis not present

## 2024-02-26 DIAGNOSIS — Z79899 Other long term (current) drug therapy: Secondary | ICD-10-CM | POA: Insufficient documentation

## 2024-02-26 DIAGNOSIS — M542 Cervicalgia: Secondary | ICD-10-CM | POA: Insufficient documentation

## 2024-02-26 DIAGNOSIS — I5032 Chronic diastolic (congestive) heart failure: Secondary | ICD-10-CM | POA: Diagnosis not present

## 2024-02-26 DIAGNOSIS — M25562 Pain in left knee: Secondary | ICD-10-CM | POA: Insufficient documentation

## 2024-02-26 DIAGNOSIS — W1839XA Other fall on same level, initial encounter: Secondary | ICD-10-CM | POA: Insufficient documentation

## 2024-02-26 DIAGNOSIS — R519 Headache, unspecified: Secondary | ICD-10-CM | POA: Diagnosis not present

## 2024-02-26 DIAGNOSIS — R9089 Other abnormal findings on diagnostic imaging of central nervous system: Secondary | ICD-10-CM | POA: Diagnosis not present

## 2024-02-26 DIAGNOSIS — W19XXXA Unspecified fall, initial encounter: Secondary | ICD-10-CM

## 2024-02-26 DIAGNOSIS — M25512 Pain in left shoulder: Secondary | ICD-10-CM | POA: Insufficient documentation

## 2024-02-26 MED ORDER — CYCLOBENZAPRINE HCL 10 MG PO TABS
5.0000 mg | ORAL_TABLET | Freq: Once | ORAL | Status: AC
Start: 2024-02-26 — End: 2024-02-26
  Administered 2024-02-26: 5 mg via ORAL
  Filled 2024-02-26 (×2): qty 1

## 2024-02-26 MED ORDER — CYCLOBENZAPRINE HCL 5 MG PO TABS: 5.0000 mg | ORAL_TABLET | Freq: Two times a day (BID) | ORAL | 0 refills | Status: DC | PRN

## 2024-02-26 NOTE — Telephone Encounter (Signed)
 FYI Only or Action Required?: FYI only for provider.  Patient was last seen in primary care on 02/25/2024 by [name], NP.  Called Nurse Triage reporting No chief complaint on file..  Symptoms began today.  Interventions attempted: Nothing.  Symptoms are: unchanged.  Triage Disposition: No disposition on file.  Patient/caregiver understands and will follow disposition?:  Reason for Disposition  Injury (or injuries) that need emergency care  Answer Assessment - Initial Assessment Questions Patient reports tripping over a suitcase today and striking her head, neck and left side. Reports pain to left neck, shoulder, wrist and side. Denies syncope or LOC, states she remembers the entire event and was able to stand after without assistance.   Patient is on eliquis . Denied neuro symptoms. This RN advised patient to go to the ED due to head strike while on blood thinners as well as to evaluate her other injuries. Patient agreed and has a driver. 911 precautions reviewed including change in mental status, dizziness, headache, vision changes, nausea, syncope or near syncope. Patient verbalized understanding.  1. MECHANISM: How did the fall happen?     Tripped over a suitcase  2. DOMESTIC VIOLENCE AND ELDER ABUSE SCREENING: Did you fall because someone pushed you or tried to hurt you? If Yes, ask: Are you safe now?     Negative  4. LOCATION: What part of the body hit the ground? (e.g., back, buttocks, head, hips, knees, hands, head, stomach)     Left: head, neck, shoulder, side  5. INJURY: Did you hurt (injure) yourself when you fell? If Yes, ask: What did you injure? Tell me more about this? (e.g., body area; type of injury; pain severity)     Left: head, neck, shoulder, side  6. PAIN: Is there any pain? If Yes, ask: How bad is the pain? (e.g., Scale 0-10; or none, mild,      Moderate  9. OTHER SYMPTOMS: Do you have any other symptoms? (e.g., dizziness, fever,  weakness; new-onset or worsening).      Denies  10. CAUSE: What do you think caused the fall (or falling)? (e.g., dizzy spell, tripped)       Tripped over suitcase  Protocols used: Falls and [name] Copied from CRM [id number]. Topic: Clinical - Red Word Triage >> Feb 26, 2024  4:32 PM Alfonso ORN wrote: Red Word that prompted transfer to Nurse Triage:  fell 30 or 40 minutes ago fell in living room trip over a suitcase , have soreness and rate 6 pain shoulder , and knees ,wrist , neck with pain fell over side and slight headache   patient call back [phone]

## 2024-02-26 NOTE — Discharge Instructions (Addendum)
 It was a pleasure taking care of you today.  Today the scans of your head and neck were negative for acute injury or fracture.  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 Please make your primary care provider as well as orthopedic doctor aware of this.  Also remember we decided not to x-ray your knees, wrists, or hands today.  If pain persist in these areas and you would like an x-ray, you can return to the emergency department or seek further medical care.  If experiencing the following symptoms including but not limited to nausea, vomiting, severe headache, visual disturbances, dizziness, weakness, severe numbness/tingling, severe head or neck pain, severe extremity pain, or other concerning symptom please return to the emergency department. You were also prescribed an outpatient muscle relaxant, please take as prescribed.  Please do not drive or operate heavy machinery after taking this medication as it can make you drowsy.

## 2024-02-26 NOTE — ED Triage Notes (Signed)
 Pt tripped and fell over a suitcase- has c/o bilateral knee, wrist, left shoulder, and left head pain. Pt denies LOC, but is on Eliquis .

## 2024-02-26 NOTE — ED Notes (Signed)
 ED Provider at bedside.

## 2024-02-26 NOTE — ED Provider Notes (Signed)
 [place] EMERGENCY DEPARTMENT AT [facility] Provider Note   CSN: [id number] Arrival date & time: 02/26/24  [id number]     Patient presents with: [place]   [name] [name] is a 63 y.o. female who presents emergency department with a chief complaint of mechanical fall over a suitcase.  Patient initially complaining of bilateral knee, wrist, left shoulder, and left head pain.  Patient denies loss of consciousness but states that she is on Eliquis .  Patient states that she called her primary care doctor who recommended she go to the emergency department due to having a fall on a blood thinner.  On further history patient states that she is really worried about her head and neck, stating that she is able to move all of her extremities and has been ambulatory since the fall.  Denies visual disturbances, nausea, vomiting, loss of consciousness.  Patient has past medical history significant for primary osteoarthritis of right hip, seasonal allergies, alcohol  use disorder, anxiety with depression, chronic left hip pain, chronic left shoulder pain, mild intermittent asthma, paroxysmal atrial fibrillation, hypertension, iron deficiency anemia, obesity, chronic diastolic heart failure, COPD, hypertension, etc.    Fall       Prior to Admission medications   Medication Sig Start Date End Date Taking? Authorizing Provider  cyclobenzaprine  (FLEXERIL ) 5 MG tablet Take 1 tablet (5 mg total) by mouth 2 (two) times daily as needed for muscle spasms. 02/26/24  Yes Timiya Howells F, PA-C  albuterol  (PROVENTIL ) (2.5 MG/3ML) 0.083% nebulizer solution Take 3 mLs (2.5 mg total) by nebulization every 6 (six) hours as needed for wheezing or shortness of breath. 06/10/23   [name], NP  albuterol  (VENTOLIN  HFA) 108 (90 Base) MCG/ACT inhaler Inhale 2 puffs into the lungs every 6 (six) hours as needed for wheezing or shortness of breath. 07/17/23   [name], NP  atorvastatin  (LIPITOR) 20 MG tablet  TAKE 1 TABLET (20 MG TOTAL) BY MOUTH AT BEDTIME FOR CHOLESTEROL 01/14/23   Eubanks, Jessica K, NP  Bioflavonoid Products (VITAMIN C PLUS PO) Take 1 tablet by mouth daily.    [provider]  Blood Pressure Monitoring (BLOOD PRESSURE CUFF) MISC To take blood pressure daily 1 hour after medication 11/30/23   Eubanks, Jessica K, NP  BREO ELLIPTA  200-25 MCG/ACT AEPB TAKE 1 PUFF BY MOUTH EVERY DAY 02/22/24   [name], NP  cholecalciferol  (VITAMIN D3) 25 MCG (1000 UNIT) tablet Take 1,000 Units by mouth in the morning.    [provider]  ELIQUIS  5 MG TABS tablet TAKE 1 TABLET BY MOUTH TWICE A DAY 10/01/23   Eubanks, Jessica K, NP  metoprolol  succinate (TOPROL -XL) 25 MG 24 hr tablet TAKE 1 TABLET (25 MG TOTAL) BY MOUTH DAILY. 02/01/24   [name], NP  montelukast  (SINGULAIR ) 10 MG tablet Take 1 tablet (10 mg total) by mouth at bedtime. 11/30/23   Eubanks, Jessica K, NP  OXYGEN  Inhale 2 L/min into the lungs as needed (wheezing/respiratory issues.).    [provider]  sacubitril -valsartan  (ENTRESTO ) 49-51 MG Take 1 tablet by mouth 2 (two) times daily. 02/16/24   Tolia, Sunit, DO  traZODone  (DESYREL ) 50 MG tablet TAKE 1 TABLET BY MOUTH EVERYDAY AT BEDTIME 02/22/24   Eubanks, Jessica K, NP    Allergies: Sodium ferric gluconate [ferrous gluconate], Imdur [isosorbide nitrate], and Tetracyclines & related    Review of Systems  Musculoskeletal:  Positive for neck pain.    Updated Vital Signs BP (!) 155/80 (BP  Location: Left Arm)   Pulse (!) 56   Temp (!) 97.4 F (36.3 C) (Oral)   Resp 18   SpO2 100%   Physical Exam Vitals and nursing note reviewed.  Constitutional:      General: She is awake. She is not in acute distress.    Appearance: Normal appearance. She is not ill-appearing, toxic-appearing or diaphoretic.  HENT:     Head: Normocephalic and atraumatic.     Comments: No raccoon eyes present, no Battle sign, scalp and facial bones nontender to palpation, no  lacerations or abrasions appreciated Eyes:     General: No scleral icterus.    Extraocular Movements: Extraocular movements intact.     Pupils: Pupils are equal, round, and reactive to light.  Pulmonary:     Effort: Pulmonary effort is normal. No respiratory distress.  Musculoskeletal:     Cervical back: Normal range of motion. Tenderness (Mild cervical spine tenderness and pain with palpation of bilateral sternocleidomastoid region) present.     Right lower leg: No edema.     Left lower leg: No edema.     Comments: Range of motion of neck normal, patient able to look left, right, up at the ceiling, and touch chin to chest  Patient able to raise bilateral upper extremities above her head, patient able to flex and extend both wrist bilaterally, patient able to flex and extend fingers of both hands, bilateral upper extremities nontender to palpation including wrist and hands  Bilateral hips nontender to palpation, pelvis stable  Left knee mildly tender to palpation however flexion and extension intact without pain, right knee nontender to palpation    Skin:    General: Skin is warm.     Capillary Refill: Capillary refill takes less than 2 seconds.  Neurological:     General: No focal deficit present.     Mental Status: She is alert and oriented to person, place, and time.     Gait: Gait normal.  Psychiatric:        Mood and Affect: Mood normal.        Behavior: Behavior normal. Behavior is cooperative.     (all labs ordered are listed, but only abnormal results are displayed) Labs Reviewed - No data to display  EKG: None  Radiology: CT Head Wo Contrast Result Date: 02/26/2024 CLINICAL DATA:  fall, on eliquis ; fall, neck pain EXAM: CT HEAD WITHOUT CONTRAST CT CERVICAL SPINE WITHOUT CONTRAST TECHNIQUE: Multidetector CT imaging of the head and cervical spine was performed following the standard protocol without intravenous contrast. Multiplanar CT image reconstructions of the  cervical spine were also generated. RADIATION DOSE REDUCTION: This exam was performed according to the departmental dose-optimization program which includes automated exposure control, adjustment of the mA and/or kV according to patient size and/or use of iterative reconstruction technique. COMPARISON:  05/14/2021 FINDINGS: CT HEAD FINDINGS Brain: Normal anatomic configuration. Parenchymal volume loss is commensurate with the patient's age. Progressive moderate periventricular white matter changes are present likely reflecting the sequela of small vessel ischemia. No abnormal intra or extra-axial mass lesion or fluid collection. No abnormal mass effect or midline shift. No evidence of acute intracranial hemorrhage or infarct. Ventricular size is normal. Cerebellum unremarkable. Vascular: No asymmetric hyperdense vasculature at the skull base. Skull: Intact Sinuses/Orbits: Paranasal sinuses are clear. Orbits are unremarkable. Other: Mastoid air cells and middle ear cavities are clear. CT CERVICAL SPINE FINDINGS Alignment: Mid cervical kyphosis.  No listhesis. Skull base and vertebrae: Craniocervical alignment is normal. The atlantodental  interval is not widened. There is no acute fracture of the cervical spine. Vertebral body height is preserved. Ankylosis of the facet joints of C2-3 on the right and C2-C4 on left. Soft tissues and spinal canal: No prevertebral fluid or swelling. No visible canal hematoma. Disc levels: Disc space narrowing and endplate remodeling is seen throughout the cervical spine in keeping with changes of moderate degenerative disc disease. Prevertebral soft tissues are not thickened on sagittal reformats. No high-grade canal stenosis. Multilevel uncovertebral and facet arthrosis results in multilevel moderate to severe neuroforaminal narrowing, most severe on the left at C3-4 and C5-6 on the right at C4-5 Upper chest: Negative. Other: None IMPRESSION: 1. No acute intracranial abnormality. No  calvarial fracture. 2. Progressive moderate periventricular white matter changes likely reflecting the sequela of small vessel ischemia. 3. No acute fracture or listhesis of the cervical spine. 4. Multilevel degenerative disc and degenerative joint disease resulting in multilevel moderate to severe neuroforaminal narrowing. Electronically Signed   By: Dorethia Molt M.D.   On: 02/26/2024 19:36   CT Cervical Spine Wo Contrast Result Date: 02/26/2024 CLINICAL DATA:  fall, on eliquis ; fall, neck pain EXAM: CT HEAD WITHOUT CONTRAST CT CERVICAL SPINE WITHOUT CONTRAST TECHNIQUE: Multidetector CT imaging of the head and cervical spine was performed following the standard protocol without intravenous contrast. Multiplanar CT image reconstructions of the cervical spine were also generated. RADIATION DOSE REDUCTION: This exam was performed according to the departmental dose-optimization program which includes automated exposure control, adjustment of the mA and/or kV according to patient size and/or use of iterative reconstruction technique. COMPARISON:  05/14/2021 FINDINGS: CT HEAD FINDINGS Brain: Normal anatomic configuration. Parenchymal volume loss is commensurate with the patient's age. Progressive moderate periventricular white matter changes are present likely reflecting the sequela of small vessel ischemia. No abnormal intra or extra-axial mass lesion or fluid collection. No abnormal mass effect or midline shift. No evidence of acute intracranial hemorrhage or infarct. Ventricular size is normal. Cerebellum unremarkable. Vascular: No asymmetric hyperdense vasculature at the skull base. Skull: Intact Sinuses/Orbits: Paranasal sinuses are clear. Orbits are unremarkable. Other: Mastoid air cells and middle ear cavities are clear. CT CERVICAL SPINE FINDINGS Alignment: Mid cervical kyphosis.  No listhesis. Skull base and vertebrae: Craniocervical alignment is normal. The atlantodental interval is not widened. There is no  acute fracture of the cervical spine. Vertebral body height is preserved. Ankylosis of the facet joints of C2-3 on the right and C2-C4 on left. Soft tissues and spinal canal: No prevertebral fluid or swelling. No visible canal hematoma. Disc levels: Disc space narrowing and endplate remodeling is seen throughout the cervical spine in keeping with changes of moderate degenerative disc disease. Prevertebral soft tissues are not thickened on sagittal reformats. No high-grade canal stenosis. Multilevel uncovertebral and facet arthrosis results in multilevel moderate to severe neuroforaminal narrowing, most severe on the left at C3-4 and C5-6 on the right at C4-5 Upper chest: Negative. Other: None IMPRESSION: 1. No acute intracranial abnormality. No calvarial fracture. 2. Progressive moderate periventricular white matter changes likely reflecting the sequela of small vessel ischemia. 3. No acute fracture or listhesis of the cervical spine. 4. Multilevel degenerative disc and degenerative joint disease resulting in multilevel moderate to severe neuroforaminal narrowing. Electronically Signed   By: Dorethia Molt M.D.   On: 02/26/2024 19:36     Procedures   Medications Ordered in the ED  cyclobenzaprine  (FLEXERIL ) tablet 5 mg (5 mg Oral Given 02/26/24 2000)  Medical Decision Making Amount and/or Complexity of Data Reviewed Radiology: ordered.  Risk Prescription drug management.   Patient presents to the ED for concern of fall, on blood thinner, this involves an extensive number of treatment options, and is a complaint that carries with it a high risk of complications and morbidity.  The differential diagnosis includes extremity injury, brain bleed, facial fracture, skull fracture, cervical spine injury, soft tissue injury, etc.   Co morbidities that complicate the patient evaluation  primary osteoarthritis of right hip, seasonal allergies, alcohol  use disorder,  anxiety with depression, chronic left hip pain, chronic left shoulder pain, mild intermittent asthma, paroxysmal atrial fibrillation, hypertension, iron deficiency anemia, obesity, chronic diastolic heart failure, COPD, hypertension   Imaging Studies ordered:  I ordered imaging studies including CT head and CT cervical spine I independently visualized and interpreted imaging which showed no acute abnormality from fall today however did show moderate to severe foraminal stenosis of multiple cervical vertebrae as well as arthritis, also chronic ischemic changes I agree with the radiologist interpretation   Medicines ordered and prescription drug management:  I ordered medication including Flexeril  for pain Reevaluation of the patient after these medicines showed that the patient improved I have reviewed the patients home medicines and have made adjustments as needed   Test Considered:  Extremity imaging: Declined at this time as patient has good range of motion of bilateral upper extremities, flexion extension intact of bilateral wrist, nontender to palpation, patient able to flex and extend fingers of both hands, bilateral knees flexion and extension intact, patient ambulatory without assistance, low clinical suspicion for extremity injury at this time, decided to not image through shared decision making with patient   Critical Interventions:  None  Problem List / ED Course:  63 year old female patient, mechanical fall at home on blood thinners Vital signs stable, patient has intact neurologic examination in bed, patient ambulatory since injury, patient complaining of widespread diffuse aches and pains on initial exam however after taking further history patient most worried about head and neck, on my physical exam low clinical suspicion for extremity injury as patient has good range of motion of bilateral upper and bilateral lower extremities Through shared decision making it was  decided that we would forego extremity imaging at this time patient is okay with plan to CT head and CT cervical spine Imaging today significant for no acute abnormality however shows chronic degenerative changes as well as moderate to severe foraminal stenosis of cervical vertebrae as well as small chronic ischemic vessel changes, I personally printed out the CT report and gave a written copy to the patient to make her orthopedic and primary care doctor aware Return precautions given Patient discharged with short course of outpatient muscle relaxant, instructed not to drive or operate heavy machinery as this medication may make her drowsy, instructed to continue over-the-counter Tylenol  as well as patient is unable to take a anti-inflammatory medication due to being on Eliquis  Most likely diagnosis at this time is soft tissue injury from fall, doubt serious acute life-threatening process as CT head and CT cervical spine today were negative, low clinical suspicion for extremity fracture as patient has good range of motion and very limited tenderness with palpation, recommend follow-up with primary care provider and already established orthopedic provider and make them aware of today's findings.   Reevaluation:  After the interventions noted above, I reevaluated the patient and found that they have :improved   Social Determinants of Health:  none   Dispostion:  After consideration of the diagnostic results and the patients response to treatment, I feel that the patent would benefit from discharge and outpatient therapy as prescribed..       Final diagnoses:  Fall, initial encounter  Injury of head, initial encounter  Neck pain    ED Discharge Orders          Ordered    cyclobenzaprine  (FLEXERIL ) 5 MG tablet  2 times daily PRN        02/26/24 1950               Parilee Hally F, PA-C 02/26/24 2343    Randol Simmonds, MD 02/27/24 [phone]

## 2024-02-26 NOTE — ED Notes (Signed)
 Patient transported to CT

## 2024-02-29 ENCOUNTER — Ambulatory Visit: Admitting: Nurse Practitioner

## 2024-02-29 NOTE — Telephone Encounter (Signed)
 Ulla Aquas, RN to [facility] (Selected Message)     02/26/24  4:48 PM Triaged to ED d/t head-strike, on blood thinners

## 2024-03-01 DIAGNOSIS — I5032 Chronic diastolic (congestive) heart failure: Secondary | ICD-10-CM | POA: Diagnosis not present

## 2024-03-02 ENCOUNTER — Ambulatory Visit: Payer: Self-pay | Admitting: Cardiology

## 2024-03-02 LAB — BASIC METABOLIC PANEL WITH GFR
BUN/Creatinine Ratio: 21 (ref 12–28)
BUN: 13 mg/dL (ref 8–27)
CO2: 21 mmol/L (ref 20–29)
Calcium: 9.3 mg/dL (ref 8.7–10.3)
Chloride: 101 mmol/L (ref 96–106)
Creatinine, Ser: 0.63 mg/dL (ref 0.57–1.00)
Glucose: 103 mg/dL — ABNORMAL HIGH (ref 70–99)
Potassium: 3.9 mmol/L (ref 3.5–5.2)
Sodium: 141 mmol/L (ref 134–144)
eGFR: 100 mL/min/1.73 (ref >59)

## 2024-03-02 LAB — PRO B NATRIURETIC PEPTIDE: NT-Pro BNP: 187 pg/mL (ref 0–287)

## 2024-03-09 ENCOUNTER — Other Ambulatory Visit: Payer: Self-pay | Admitting: Nurse Practitioner

## 2024-03-09 DIAGNOSIS — I5032 Chronic diastolic (congestive) heart failure: Secondary | ICD-10-CM

## 2024-03-09 DIAGNOSIS — I480 Paroxysmal atrial fibrillation: Secondary | ICD-10-CM

## 2024-03-20 ENCOUNTER — Other Ambulatory Visit: Payer: Self-pay | Admitting: Nurse Practitioner

## 2024-03-20 DIAGNOSIS — E785 Hyperlipidemia, unspecified: Secondary | ICD-10-CM

## 2024-03-25 DIAGNOSIS — J9600 Acute respiratory failure, unspecified whether with hypoxia or hypercapnia: Secondary | ICD-10-CM | POA: Diagnosis not present

## 2024-03-29 ENCOUNTER — Ambulatory Visit: Attending: Cardiology | Admitting: Pharmacist

## 2024-03-29 ENCOUNTER — Other Ambulatory Visit (HOSPITAL_COMMUNITY): Payer: Self-pay

## 2024-03-29 ENCOUNTER — Encounter: Payer: Self-pay | Admitting: Pharmacist

## 2024-03-29 VITALS — BP 172/92 | HR 65

## 2024-03-29 DIAGNOSIS — I10 Essential (primary) hypertension: Secondary | ICD-10-CM | POA: Diagnosis not present

## 2024-03-29 MED ORDER — OMRON 3 SERIES BP MONITOR DEVI
1.0000 | Freq: Every day | 0 refills | Status: AC
  Filled 2024-03-29: qty 1, 30d supply, fill #0

## 2024-03-29 MED ORDER — SPIRONOLACTONE 25 MG PO TABS
25.0000 mg | ORAL_TABLET | Freq: Every day | ORAL | 0 refills | Status: DC
Start: 2024-03-29 — End: 2024-04-12
  Filled 2024-03-29: qty 90, 90d supply, fill #0

## 2024-03-29 NOTE — Assessment & Plan Note (Signed)
 Assessment: BP is uncontrolled in office BP 179/84 2nd reading 172/92 mmHg heart rate 65 (goal <130/80). Takes and tolerates Entresto  and metoprolol  well  Working on lowering slat intake  Denies SOB, palpitation, chest pain, headaches,or swelling Able to complete all ADLs. She does not  checks her weight at home . denies LEE, PND, or orthopnea. Appetite has been normal.  Reiterated the importance of regular exercise and low salt diet   Plan:  Start taking spironolactone  25 mg daily  Continue taking metoprolol  XL 25 mg daily, Entresto  49/51 mg twice daily Patient to keep record of BP readings with heart rate and report to us  at the next visit Patient to see PharmD in 2 weeks for follow up  Follow up lab(s): BMP due on Sept 19,2025

## 2024-03-29 NOTE — Progress Notes (Signed)
 Patient ID: [name] [name]                 DOB: [birth year]                      MRN: [id number]     HPI: [name] [name] is a 63 y.o. female referred by Dr. Michele to pharmacy clinic for HF medication management. PMH is significant for Persistent atrial fibrillation status post direct-current cardioversion, aortic atherosclerosis, hypertension, asthma, heart failure with improved EF / HFpEF,  sleep apnea not on CPAP, COPD, history of alcohol  abuse, postmenopause. Most recent LVEF 60-65%  LVEF has improved from 25-30% to 60-65% with grade 2 diastolic dysfunction.  Patient tried Farxiga  in the past, did not feel right so stopped. Did not tolerated BiDil ( isosorbide dinitrate and hydralazine ) well due to headaches and vision changes. Entresto  dose was increased to 49/51 last month and post BMP was WNL and NT Pro BNP was 187.   Patient presented today for HF meds optimization. Reports she tolerates her HF medications well. She does not want to ever try Farxiga  again. She des not check her BP at home. She has lowered salt intake but still uses some herb mix with salt. Discussion with patient today included the following: cardiac medication indications, introduction to GDMT clinic, reasoning behind medication titration, importance of medication adherence, and patient engagement.  Symptomatically, she is feeling well, denies  dizziness, lightheadedness, and fatigue. denies chest pain or palpitations. Able to complete all ADLs. She does not  checks her weight at home . denies LEE, PND, or orthopnea. Appetite has been normal.   Patient is reluctant to make any medication changes at 1st but when she saw elevated BP readings she is in agreement to add MRA agent to her current HF meds  Current CHF meds: metoprolol  XL 25 mg daily, Entresto  49/51 mg twice daily,  Previously tried: Farxiga - tiredness  Adherence Assessment  Do you ever forget to take your medication? [] Yes [x] No  Do you ever skip doses due to side  effects? [] Yes [x] No  Do you have trouble affording your medicines? [] Yes [x] No  Are you ever unable to pick up your medication due to transportation difficulties? [] Yes [x] No  Do you ever stop taking your medications because you don't believe they are helping? [] Yes [x] No  Do you check your weight daily? [] Yes [x] No   Adherence strategy: remember to take it   Barriers to obtaining medications: none   BP goal: <130/80   Family History:  Relation Problem Comments  Mother Metallurgist) Colon cancer (Age: 55)   Hypertension     Father (Deceased at age 34) Cancer (Age: 72) liver cancer  Hypertension     Sister (Deceased at age 22) Cerebral aneurysm     Sister (Alive) Goiter     Social History:  Alcohol : quit 03/2021  Smoking: never   Diet: heart healthy diet  Eats out every 3 week   Exercise: stays active with grand daugher but no structured exercise schedule - willing to start 15 min walks 4-5 days per week and resistance chair exercise 2 days per week   Home BP readings: does not check at home   Wt Readings from Last 3 Encounters:  02/16/24 200 lb (90.7 kg)  02/04/24 207 lb 9.6 oz (94.2 kg)  11/26/23 205 lb 12.8 oz (93.4 kg)   BP Readings from Last 3 Encounters:  02/26/24 (!) 155/80  02/16/24 (!) 177/98  02/04/24 (!) 142/80  Pulse Readings from Last 3 Encounters:  02/26/24 (!) 56  02/16/24 64  02/04/24 62    Renal function: CrCl cannot be calculated (Patient's most recent lab result is older than the maximum 21 days allowed.).  Past Medical History:  Diagnosis Date   Anemia    Anxiety    Arthritis    Asthma    Bloody stool    Chronic diastolic heart failure (HCC)    COPD (chronic obstructive pulmonary disease) (HCC)    Dental decay    Dysrhythmia    Atrial flutter   Gastritis, Helicobacter pylori 06/22/2015   Hypertension    Paroxysmal atrial fibrillation (HCC)    Pneumonia 06/21/2015   Prepyloric ulcer 06/22/2015   Seasonal allergies    Sleep  apnea    no CPAP    Current Outpatient Medications on File Prior to Visit  Medication Sig Dispense Refill   albuterol  (PROVENTIL ) (2.5 MG/3ML) 0.083% nebulizer solution Take 3 mLs (2.5 mg total) by nebulization every 6 (six) hours as needed for wheezing or shortness of breath. 150 mL 6   albuterol  (VENTOLIN  HFA) 108 (90 Base) MCG/ACT inhaler Inhale 2 puffs into the lungs every 6 (six) hours as needed for wheezing or shortness of breath. 8.5 g 11   atorvastatin  (LIPITOR) 20 MG tablet TAKE 1 TABLET (20 MG TOTAL) BY MOUTH AT BEDTIME FOR CHOLESTEROL 90 tablet 1   Bioflavonoid Products (VITAMIN C PLUS PO) Take 1 tablet by mouth daily.     Blood Pressure Monitoring (BLOOD PRESSURE CUFF) MISC To take blood pressure daily 1 hour after medication 1 each 0   BREO ELLIPTA  200-25 MCG/ACT AEPB TAKE 1 PUFF BY MOUTH EVERY DAY 60 each 1   cholecalciferol  (VITAMIN D3) 25 MCG (1000 UNIT) tablet Take 1,000 Units by mouth in the morning.     cyclobenzaprine  (FLEXERIL ) 5 MG tablet Take 1 tablet (5 mg total) by mouth 2 (two) times daily as needed for muscle spasms. 10 tablet 0   ELIQUIS  5 MG TABS tablet TAKE 1 TABLET BY MOUTH TWICE A DAY 180 tablet 1   metoprolol  succinate (TOPROL -XL) 25 MG 24 hr tablet TAKE 1 TABLET (25 MG TOTAL) BY MOUTH DAILY. 30 tablet 3   montelukast  (SINGULAIR ) 10 MG tablet Take 1 tablet (10 mg total) by mouth at bedtime. 90 tablet 1   OXYGEN  Inhale 2 L/min into the lungs as needed (wheezing/respiratory issues.).     sacubitril -valsartan  (ENTRESTO ) 49-51 MG Take 1 tablet by mouth 2 (two) times daily. 60 tablet 3   traZODone  (DESYREL ) 50 MG tablet TAKE 1 TABLET BY MOUTH EVERYDAY AT BEDTIME 30 tablet 11   No current facility-administered medications on file prior to visit.    Allergies  Allergen Reactions   Sodium Ferric Gluconate [Ferrous Gluconate] Other (See Comments)    Patient had near syncope with abdominal pain and diaphoresis about an hour after IV ferric gluconate although it was  while she was on commode having bowel movements   Imdur [Isosorbide Nitrate]     headache   Tetracyclines & Related Itching     Assessment/Plan:  1. CHF -  No problem-specific Assessment & Plan notes found for this encounter.  There are no diagnoses linked to this encounter.     Thank you   Robbi Blanch, Pharm.D [facility] [address], [place], [place] [address] Phone: [phone]; Fax: [phone]

## 2024-03-29 NOTE — Patient Instructions (Signed)
 Changes made by your pharmacist Robbi Blanch, PharmD at today's visit:    Instructions/Changes  (what do you need to do) Your Notes  (what you did and when you did it)  Start taking spironolactone  25 mg daily in the morning    Continue taking Entresto  49/51 mg twice daily and metoprolol  XL 25 mg daily    Lower salt intake and start exercise regularly 30 min walks with chair exercise every other day     Bring all of your meds, your BP cuff and your record of home blood pressures to your next appointment.    HOW TO TAKE YOUR BLOOD PRESSURE AT HOME  Rest 5 minutes before taking your blood pressure.  Don't smoke or drink caffeinated beverages for at least 30 minutes before. Take your blood pressure before (not after) you eat. Sit comfortably with your back supported and both feet on the floor (don't cross your legs). Elevate your arm to heart level on a table or a desk. Use the proper sized cuff. It should fit smoothly and snugly around your bare upper arm. There should be enough room to slip a fingertip under the cuff. The bottom edge of the cuff should be 1 inch above the crease of the elbow. Ideally, take 3 measurements at one sitting and record the average.  Important lifestyle changes to control high blood pressure  Intervention  Effect on the BP  Lose extra pounds and watch your waistline Weight loss is one of the most effective lifestyle changes for controlling blood pressure. If you're overweight or obese, losing even a small amount of weight can help reduce blood pressure. Blood pressure might go down by about 1 millimeter of mercury (mm Hg) with each kilogram (about 2.2 pounds) of weight lost.  Exercise regularly As a general goal, aim for at least 30 minutes of moderate physical activity every day. Regular physical activity can lower high blood pressure by about 5 to 8 mm Hg.  Eat a healthy diet Eating a diet rich in whole grains, fruits, vegetables, and low-fat dairy products  and low in saturated fat and cholesterol. A healthy diet can lower high blood pressure by up to 11 mm Hg.  Reduce salt (sodium) in your diet Even a small reduction of sodium in the diet can improve heart health and reduce high blood pressure by about 5 to 6 mm Hg.  Limit alcohol  One drink equals 12 ounces of beer, 5 ounces of wine, or 1.5 ounces of 80-proof liquor.  Limiting alcohol  to less than one drink a day for women or two drinks a day for men can help lower blood pressure by about 4 mm Hg.   If you have any questions or concerns please use My Chart to send questions or call the office at [phone]

## 2024-03-30 ENCOUNTER — Other Ambulatory Visit: Payer: Self-pay

## 2024-03-30 DIAGNOSIS — J4520 Mild intermittent asthma, uncomplicated: Secondary | ICD-10-CM

## 2024-04-05 ENCOUNTER — Encounter: Payer: Self-pay | Admitting: Primary Care

## 2024-04-05 ENCOUNTER — Ambulatory Visit: Admitting: Pulmonary Disease

## 2024-04-05 ENCOUNTER — Ambulatory Visit: Admitting: Primary Care

## 2024-04-05 VITALS — BP 132/70 | HR 58 | Temp 97.6°F | Ht 63.0 in | Wt 206.4 lb

## 2024-04-05 DIAGNOSIS — G4734 Idiopathic sleep related nonobstructive alveolar hypoventilation: Secondary | ICD-10-CM

## 2024-04-05 DIAGNOSIS — G4733 Obstructive sleep apnea (adult) (pediatric): Secondary | ICD-10-CM

## 2024-04-05 DIAGNOSIS — J4520 Mild intermittent asthma, uncomplicated: Secondary | ICD-10-CM | POA: Diagnosis not present

## 2024-04-05 DIAGNOSIS — I10 Essential (primary) hypertension: Secondary | ICD-10-CM | POA: Diagnosis not present

## 2024-04-05 DIAGNOSIS — Z6836 Body mass index (BMI) 36.0-36.9, adult: Secondary | ICD-10-CM

## 2024-04-05 DIAGNOSIS — I272 Pulmonary hypertension, unspecified: Secondary | ICD-10-CM

## 2024-04-05 DIAGNOSIS — J984 Other disorders of lung: Secondary | ICD-10-CM | POA: Diagnosis not present

## 2024-04-05 DIAGNOSIS — E669 Obesity, unspecified: Secondary | ICD-10-CM

## 2024-04-05 DIAGNOSIS — G4730 Sleep apnea, unspecified: Secondary | ICD-10-CM

## 2024-04-05 LAB — PULMONARY FUNCTION TEST
DL/VA % pred: 107 %
DL/VA: 4.52 ml/min/mmHg/L
DLCO unc % pred: 75 %
DLCO unc: 14.52 ml/min/mmHg
FEF 25-75 Post: 1.46 L/s
FEF 25-75 Pre: 1.01 L/s
FEF2575-%Change-Post: 43 %
FEF2575-%Pred-Post: 67 %
FEF2575-%Pred-Pre: 46 %
FEV1-%Change-Post: 10 %
FEV1-%Pred-Post: 62 %
FEV1-%Pred-Pre: 56 %
FEV1-Post: 1.49 L
FEV1-Pre: 1.34 L
FEV1FVC-%Change-Post: 2 %
FEV1FVC-%Pred-Pre: 95 %
FEV6-%Change-Post: 8 %
FEV6-%Pred-Post: 65 %
FEV6-%Pred-Pre: 60 %
FEV6-Post: 1.96 L
FEV6-Pre: 1.81 L
FEV6FVC-%Change-Post: 0 %
FEV6FVC-%Pred-Post: 103 %
FEV6FVC-%Pred-Pre: 103 %
FVC-%Change-Post: 7 %
FVC-%Pred-Post: 63 %
FVC-%Pred-Pre: 58 %
FVC-Post: 1.96 L
FVC-Pre: 1.81 L
Post FEV1/FVC ratio: 76 %
Post FEV6/FVC ratio: 100 %
Pre FEV1/FVC ratio: 74 %
Pre FEV6/FVC Ratio: 100 %
RV % pred: 103 %
RV: 2.06 L
TLC % pred: 84 %
TLC: 4.16 L

## 2024-04-05 NOTE — Progress Notes (Deleted)
 Full PFT performed today.

## 2024-04-05 NOTE — Patient Instructions (Signed)
 Full pft performed today

## 2024-04-05 NOTE — Progress Notes (Signed)
 Full pft performed today

## 2024-04-05 NOTE — Progress Notes (Signed)
 @Patient  ID: [name] [name], female    DOB: 15-Aug-1960, 63 y.o.   MRN: [id number]  Chief Complaint  Patient presents with   Medical Management of Chronic Issues    PFT f/u. Does not treat OSA    Referring provider: [name], NP  HPI: 63 year old female, never smoked.  Past medical history significant for mild intermittent asthma, allergies, acute respiratory failure, elevated blood pressure readings, osteoarthritis, alcohol  use disorder, anxiety/depression.   Previous [facility] pulmonary encounter: 03/28/2021 Patient presents today for hospital follow-up.  She was admitted from 03/18/2021-03/25/2021 for acute on chronic hypercarbic respiratory failure, acute on chronic diastolic heart failure.  Patient was consulted by Dr. Kassie with PCCM inpatient.  Patient presented to emergency room shortness of breath/wheezing.  COVID test was negative.  Oxygen  saturation was 89% on room air.  She received albuterol  nebulized treatments and IV Solu-Medrol .  It was recommended that she restart her Symbicort  for mild intermittent asthma.  BiPAP at bedtime, patient likely has undiagnosed obesity hypoventilation syndrome and possibly OSA.  She was discharged on 2 L of oxygen  and prednisone  taper.  Need outpatient sleep study.  She is doing well today. She has had not episodes of shortness of breath, wheezing or chest tightness. She is using Breo 200mcg daily as prescribed. Prior to hospitalization she was not sleeping well. She is currently taking trazodone  prescribed buy her PCP which has been helping.   Sleep questionnaire Symptoms- Snoring, restless sleep Prior sleep study- None Bedtime-9-10pm Out of bed in morning- 6am Nocturnal awakenings- once  Epworth- 7   May 12th 2023- Dr. Kara  Emer Onnen is a 63 year old woman, never smoker with history of asthma, alcohol  abuse, atrial fibrillation, hypertension and HFpEF who was admitted at [facility] 4/9 to 4/18 for asthma exacerbation and atrial fibrillation  with RVR. She returns to pulmonary clinic for follow up.   She was treated with steroid taper and ICS/LAMA/LABA nebulizer treatments while in the hospital. She had TEE cardiversion for the a fib with RVR on 10/31/21 and new LVEF of 25-30%. She had EGD on 4/17 esophageal plaques (biopsied), 2 cm hiatal hernia and erythematous mucosa in the antrum (biopsied) and normal duodenum (biopsied).  Colonoscopy with normal examined ileum, colon polyps and nonbleeding internal hemorrhoid.  She was discharged with Breo ellipta  200 inhaler 1 puff every other day. She is using albuterol  inhaler about 4 times per week. She is using oxygen  2L at night.   She has stress test on Monday with cardiology.  She reports doing well since her hospitalization. She is rarely drinking alcohol  now. She denies cough, wheezing or dyspnea. She denies night time awakenings.   05/06/2022 Patient presents today for overdue follow-up. Hx asthma, afib, HF. Admitted in September for acute on chronic diastolic heart failure, COPD exacerbation and acute on chronic respiratory failure. Discharged on prednisone  taper. Maintained on Breo 200mcg and Torsemide . She is doing well, feeling better. She is here to go over sleep study results from 01/01/22 that showed severe OSA, AHI 63/hour. We discussed treatment options, recommending she be started on CPAP d/t severity of OSA and cardiac history. She is open to starting treatment.    11/26/2023 Discussed the use of AI scribe software for clinical note transcription with the patient, who gave verbal consent to proceed.  History of Present Illness   [name] [name] is a 63 year old female with mild intermittent asthma who presents for follow-up of her respiratory symptoms.  She has experienced significant  improvement in her asthma symptoms since her last visit. She is currently using Breo 200 micrograms, one puff daily, and has not needed her rescue inhaler in the past week. She had exacerbations in  February, March, and April, which required emergency room visits and urgent care. During these episodes, she used her albuterol  inhaler and nebulizer without sufficient relief, leading to treatment with IV steroids and prednisone . Her symptoms have improved significantly since then, and she attributes some of the exacerbations to flu and pollen exposure.  She was diagnosed with severe obstructive sleep apnea in 2023 and was recommended CPAP therapy, which she used for about a month before discontinuing due to discomfort. She reports no current issues with sleep and has lost 30 pounds since her sleep study, which she believes has contributed to her improved sleep quality. No waking up gasping or choking, and no snoring, as confirmed by her daughter.  She has a history of alcohol  use but reports cessation since September 2023. She has not consumed alcohol  since then and attributes this change to personal decisions and lifestyle modifications.     1. Mild intermittent asthma without complication (Primary) - Pulmonary Function Test; Future   2. Severe sleep apnea   Assessment and Plan    Mild Intermittent Asthma Mild intermittent asthma with exacerbations in February, March, and April, necessitating emergency room and urgent care visits. Symptoms have improved significantly with the current treatment regimen. Recent exacerbations likely due to influenza and pollen exposure. Currently well-managed with no recent need for rescue inhaler. - Continue Breo Ellipta  200 mcg one puff daily and montelukast  10mg  at bedtime. - Use albuterol  inhaler as needed for rescue, every 4-6 hours. - Order pulmonary function test for baseline assessment at next follow-up. - Advise to contact clinic if respiratory symptoms worsen, including cough, dyspnea, chest tightness, or wheezing.   Severe Obstructive Sleep Apnea Severe obstructive sleep apnea diagnosed in 2023. She reports significant weight loss and cessation of  CPAP use, with no current symptoms. Declines repeat sleep study and CPAP use. Weight loss likely contributed to symptom improvement. - Set weight loss goal to 185 pounds. - Advise sleeping on side or using a wedge pillow to elevate head. - Discuss potential use of weight loss medication Zepbound (tirzepatide) with primary care physician if needed. - Advise against alcohol  consumption before bed and driving if fatigued.   Atrial Fibrillation Continue Eliquis  as directed    04/05/2024- Interim hx  Discussed the use of AI scribe software for clinical note transcription with the patient, who gave verbal consent to proceed.  Asthma- Breo 200 and montelukast . Ordered PFTs OSA- Diagnosed with severe OSA in 2023, AHI 63/hour. Ordered CPAP in October 2023 auto settings 5-20cm h20. She lost weight and stopped wearing CPAP. No currently symptomatic. Declined repeat sleep study and CPAP use. Goal weight 185. Current weight 206. Afib - under went TTE guided cardioversion in April 2023. On metoprolol  XL and Eliquis  HTN - Previous readings have been elevated 170/90s (goal <130/80) stated on spirnolactone 25mg  daily, increased entresto  twice daily in August  History of Present Illness [name] [name] is a 63 year old female with asthma and severe sleep apnea who presents with a new onset cough.  She has a new onset dry cough that began approximately two weeks ago. There is no mucus production, but she experiences a sensation of congestion in her chest and a slight nasal drip. No foul taste or significant chest tightness. Current asthma medications include Breo 200 micrograms,  one puff daily, and montelukast  (Singulair ).  Her asthma was previously diagnosed as mild intermittent, with flare-ups occurring in February, March, and April. She denies any history of smoking and has never been diagnosed with asthma as a child. Recent pulmonary function tests show no obstructive lung disease but indicate some restrictive  lung disease. There is a partial bronchodilator response.  She was diagnosed with severe sleep apnea in 2023, with 63 pauses per hour during her sleep study. She has previously declined a repeat sleep study and the use of CPAP therapy. She reports significant weight loss since her last evaluation, attributing it to cessation of alcohol  consumption. No current symptoms of snoring or waking up gasping or choking, and her granddaughter has not recently commented on her snoring.  She has a history of atrial fibrillation and was cardioverted in 2023. She is currently on metoprolol  extended-release and Eliquis . She also has a history of hypertension, with recent medication adjustments including spironolactone  25 mg daily and an increased dose of Entresto , which have improved her blood pressure readings.  She reports a significant weight loss of approximately 25 pounds, achieved primarily through cessation of alcohol  consumption and dietary changes. She is working towards a goal weight of 185 pounds. She mentions a preference for a heart-healthy diet but struggles with cravings for sweets like honey buns and donuts.  Pulmonary function testing: 04/05/2024 >> FVC 1.96 (62%), FEV1 1.49 (62%), ratio 76 Restrictive lungs disease, borderline BD response, mild diffusion defect  Imaging: 10/29/21 CTA>> Negative for PE. Lungs are clear. No pleural effusion or pneumothorax. Incidental small pleural lipoma about the peripheral right upper lobe. Cardiomegaly. Enlargement of main pulmonary artery.   11/01/21 Echocardiogram >> Grade 3 diastolic dysfunction. Mildly elevated pulmonary systolic pressure.   Allergies  Allergen Reactions   Sodium Ferric Gluconate [Ferrous Gluconate] Other (See Comments)    Patient had near syncope with abdominal pain and diaphoresis about an hour after IV ferric gluconate although it was while she was on commode having bowel movements   Imdur [Isosorbide Nitrate]     headache    Tetracyclines & Related Itching    Immunization History  Administered Date(s) Administered   PPD Test 03/12/2015   Tdap 03/27/2015    Past Medical History:  Diagnosis Date   Anemia    Anxiety    Arthritis    Asthma    Bloody stool    Chronic diastolic heart failure (HCC)    COPD (chronic obstructive pulmonary disease) (HCC)    Dental decay    Dysrhythmia    Atrial flutter   Gastritis, Helicobacter pylori 06/22/2015   Hypertension    Paroxysmal atrial fibrillation (HCC)    Pneumonia 06/21/2015   Prepyloric ulcer 06/22/2015   Seasonal allergies    Sleep apnea    no CPAP    Tobacco History: Social History   Tobacco Use  Smoking Status Never  Smokeless Tobacco Never   Counseling given: Not Answered   Outpatient Medications Prior to Visit  Medication Sig Dispense Refill   albuterol  (PROVENTIL ) (2.5 MG/3ML) 0.083% nebulizer solution Take 3 mLs (2.5 mg total) by nebulization every 6 (six) hours as needed for wheezing or shortness of breath. 150 mL 6   albuterol  (VENTOLIN  HFA) 108 (90 Base) MCG/ACT inhaler Inhale 2 puffs into the lungs every 6 (six) hours as needed for wheezing or shortness of breath. 8.5 g 11   atorvastatin  (LIPITOR) 20 MG tablet TAKE 1 TABLET (20 MG TOTAL) BY MOUTH AT BEDTIME FOR CHOLESTEROL 90  tablet 1   Bioflavonoid Products (VITAMIN C PLUS PO) Take 1 tablet by mouth daily.     Blood Pressure Monitoring (OMRON 3 SERIES BP MONITOR) DEVI Monitor blood pressure daily. 1 each 0   BREO ELLIPTA  200-25 MCG/ACT AEPB TAKE 1 PUFF BY MOUTH EVERY DAY 60 each 1   cholecalciferol  (VITAMIN D3) 25 MCG (1000 UNIT) tablet Take 1,000 Units by mouth in the morning.     ELIQUIS  5 MG TABS tablet TAKE 1 TABLET BY MOUTH TWICE A DAY 180 tablet 1   metoprolol  succinate (TOPROL -XL) 25 MG 24 hr tablet TAKE 1 TABLET (25 MG TOTAL) BY MOUTH DAILY. 30 tablet 3   montelukast  (SINGULAIR ) 10 MG tablet Take 1 tablet (10 mg total) by mouth at bedtime. 90 tablet 1   OXYGEN  Inhale 2 L/min  into the lungs as needed (wheezing/respiratory issues.).     sacubitril -valsartan  (ENTRESTO ) 49-51 MG Take 1 tablet by mouth 2 (two) times daily. 60 tablet 3   spironolactone  (ALDACTONE ) 25 MG tablet Take 1 tablet (25 mg total) by mouth daily. 90 tablet 0   traZODone  (DESYREL ) 50 MG tablet TAKE 1 TABLET BY MOUTH EVERYDAY AT BEDTIME 30 tablet 11   No facility-administered medications prior to visit.   Review of Systems  Review of Systems  Constitutional: Negative.   Respiratory: Negative.     Physical Exam  BP 132/70   Pulse (!) 58   Temp 97.6 F (36.4 C)   Ht 5' 3 (1.6 m)   Wt 206 lb 6.4 oz (93.6 kg)   SpO2 99% Comment: ra  BMI 36.56 kg/m  Physical Exam Constitutional:      Appearance: Normal appearance. She is well-developed.  HENT:     Head: Normocephalic and atraumatic.     Mouth/Throat:     Mouth: Mucous membranes are moist.     Pharynx: Oropharynx is clear.  Eyes:     Pupils: Pupils are equal, round, and reactive to light.  Cardiovascular:     Rate and Rhythm: Normal rate and regular rhythm.     Heart sounds: Normal heart sounds. No murmur heard. Pulmonary:     Effort: Pulmonary effort is normal. No respiratory distress.     Breath sounds: Normal breath sounds. No wheezing or rhonchi.  Abdominal:     General: Bowel sounds are normal.     Palpations: Abdomen is soft.     Tenderness: There is no abdominal tenderness.  Musculoskeletal:        General: Normal range of motion.     Cervical back: Normal range of motion and neck supple.  Skin:    General: Skin is warm and dry.     Findings: No erythema or rash.  Neurological:     General: No focal deficit present.     Mental Status: She is alert and oriented to person, place, and time. Mental status is at baseline.  Psychiatric:        Mood and Affect: Mood normal.        Behavior: Behavior normal.        Thought Content: Thought content normal.        Judgment: Judgment normal.     Lab Results:  CBC     Component Value Date/Time   WBC 8.0 10/26/2023 0904   RBC 3.81 (L) 10/26/2023 0904   HGB 11.1 (L) 10/26/2023 0904   HGB 13.1 08/22/2021 1143   HCT 34.6 (L) 10/26/2023 0904   HCT 39.8 08/22/2021 1143   PLT  230 10/26/2023 0904   PLT 285 08/22/2021 1143   MCV 90.8 10/26/2023 0904   MCV 87 08/22/2021 1143   MCH 29.1 10/26/2023 0904   MCHC 32.1 10/26/2023 0904   RDW 14.4 10/26/2023 0904   RDW 14.9 08/22/2021 1143   LYMPHSABS 2.6 10/26/2023 0904   LYMPHSABS 2.1 08/22/2021 1143   MONOABS 0.6 10/26/2023 0904   EOSABS 0.3 10/26/2023 0904   EOSABS 0.1 08/22/2021 1143   BASOSABS 0.0 10/26/2023 0904   BASOSABS 0.0 08/22/2021 1143    BMET    Component Value Date/Time   NA 141 03/01/2024 1406   K 3.9 03/01/2024 1406   CL 101 03/01/2024 1406   CO2 21 03/01/2024 1406   GLUCOSE 103 (H) 03/01/2024 1406   GLUCOSE 98 10/26/2023 0904   BUN 13 03/01/2024 1406   CREATININE 0.63 03/01/2024 1406   CREATININE 0.61 07/17/2023 0955   CALCIUM  9.3 03/01/2024 1406   GFRNONAA >60 10/26/2023 0904   GFRAA >60 10/28/2019 1227    BNP    Component Value Date/Time   BNP 78.5 10/26/2023 0904    ProBNP    Component Value Date/Time   PROBNP 187 03/01/2024 1406   PROBNP 59.0 03/14/2010 0710    Imaging: No results found.   Assessment & Plan:   No problem-specific Assessment & Plan notes found for this encounter.   1. Severe sleep apnea (Primary)  2. Nocturnal hypoxia  3. Mild intermittent asthma without complication  Assessment and Plan Assessment & Plan Mild intermittent asthma Mild intermittent asthma with recent onset of dry cough for approximately two weeks. No obstructive lung disease on recent pulmonary function tests. Partial or borderline bronchodilator response. No current wheezing or chest tightness. Possible contribution from allergies. - Continue Breo 200 mcg, one puff daily - Continue montelukast  - Consider over-the-counter antihistamine like Claritin  or Zyrtec -  Consider nasal spray for nasal drip  Obstructive sleep apnea, severe Severe obstructive sleep apnea diagnosed in 2023 with 63 apneic events per hour. CPAP intolerance due to discomfort. Weight loss noted since last evaluation. Potential impact on cardiovascular health discussed, including increased risk for atrial fibrillation, pulmonary hypertension, diabetes, and Alzheimer's if untreated. She declined surgical option (Inspire) and medication (Zepbound) for weight loss. Open to home sleep study and potential CPAP discussion if apnea remains moderate or severe. - Order home sleep study to reassess severity of sleep apnea - Discuss CPAP therapy if sleep study shows moderate or severe apnea - Consider oral appliance therapy if CPAP is not tolerated - Emphasize importance of weight loss for management of sleep apnea  Restrictive lung disease Restrictive lung disease noted on pulmonary function tests, possibly related to weight or chest anatomy. Total lung capacity is normal. No obstructive lung disease present.  Pulmonary hypertension Pulmonary hypertension with enlarged pulmonary artery. Potential contribution from sleep apnea and chronic diastolic heart failure.  Atrial fibrillation, status post cardioversion Atrial fibrillation, status post cardioversion in 2023. Currently on metoprolol  and Eliquis  for rate control and anticoagulation.  Essential hypertension Essential hypertension with previous elevated readings. Recent improvement in blood pressure control with spironolactone  and increased dose of Entresto . Current reading is 132/70 mmHg.  Obesity Obesity with BMI of 36. Weight loss of 25 pounds noted since last visit. She declined medication for weight loss. Emphasis on lifestyle modifications and dietary changes. - Encourage continued weight loss efforts - Discuss dietary modifications and portion control - Reinforce importance of weight loss for overall health  Recording duration: 22  minutes  [name] LELON Ferrari, NP 04/05/2024

## 2024-04-05 NOTE — Patient Instructions (Addendum)
  VISIT SUMMARY: Today, we discussed your new onset dry cough, your asthma, and your severe sleep apnea. We also reviewed your recent weight loss and its positive impact on your health, including your blood pressure and atrial fibrillation management. We talked about your restrictive lung disease, pulmonary hypertension, and chronic diastolic heart failure. Additionally, we addressed your essential hypertension and obesity, emphasizing the importance of continued weight loss and a heart-healthy diet.  YOUR PLAN: -MILD INTERMITTENT ASTHMA WITH RECENT COUGH: You have mild intermittent asthma and recently developed a dry cough. Your recent pulmonary tests show no obstructive lung disease but some restrictive lung disease. Continue taking Breo 200 mcg, one puff daily, and montelukast . You may consider using an over-the-counter antihistamine like Claritin  or Zyrtec and a nasal spray for your nasal drip.  -OBSTRUCTIVE SLEEP APNEA, SEVERE: You have severe obstructive sleep apnea, which means you have frequent pauses in breathing during sleep. This condition can affect your heart and overall health. We will order a home sleep study to reassess the severity. If the study shows moderate or severe apnea, we will discuss CPAP therapy. If CPAP is not tolerated, we may consider an oral appliance. Weight loss is important for managing sleep apnea.  -RESTRICTIVE LUNG DISEASE: Your pulmonary function tests indicate restrictive lung disease, which means your lungs cannot fully expand. This may be related to your weight or chest anatomy. There is no obstructive lung disease present.  -PULMONARY HYPERTENSION: You have pulmonary hypertension, which means high blood pressure in the lungs. This may be related to your sleep apnea and chronic diastolic heart failure.  -CHRONIC DIASTOLIC HEART FAILURE: You have chronic diastolic heart failure, which means your heart has difficulty relaxing and filling with blood. Managing your  fluid levels and blood pressure is crucial.  -ATRIAL FIBRILLATION, STATUS POST CARDIOVERSION: You have a history of atrial fibrillation, an irregular heart rhythm, and were treated with cardioversion in 2023. You are currently taking metoprolol  and Eliquis  to control your heart rate and prevent blood clots.  -ESSENTIAL HYPERTENSION: You have essential hypertension, which means high blood pressure without a known cause. Your blood pressure has improved with spironolactone  and an increased dose of Entresto . Your current reading is 132/70 mmHg.  -OBESITY: You have obesity with a BMI of 36. You have lost 25 pounds since your last visit, which is excellent progress. Continue with your weight loss efforts, focus on dietary modifications, and portion control. Weight loss is important for your overall health.  INSTRUCTIONS: We will order a home sleep study to reassess the severity of your sleep apnea. Please continue with your current medications and consider using an over-the-counter antihistamine and nasal spray for your cough and nasal  drip. Follow up with us  after the sleep study to discuss the results and next steps.  Follow-up: 3-4 months with Landry NP

## 2024-04-08 ENCOUNTER — Ambulatory Visit (INDEPENDENT_AMBULATORY_CARE_PROVIDER_SITE_OTHER): Admitting: Family

## 2024-04-08 ENCOUNTER — Encounter: Payer: Self-pay | Admitting: Family

## 2024-04-08 ENCOUNTER — Ambulatory Visit: Payer: Self-pay

## 2024-04-08 VITALS — BP 132/82 | HR 60 | Temp 97.9°F | Resp 20 | Ht 63.0 in | Wt 207.2 lb

## 2024-04-08 DIAGNOSIS — R1012 Left upper quadrant pain: Secondary | ICD-10-CM | POA: Diagnosis not present

## 2024-04-08 DIAGNOSIS — R109 Unspecified abdominal pain: Secondary | ICD-10-CM

## 2024-04-08 DIAGNOSIS — R21 Rash and other nonspecific skin eruption: Secondary | ICD-10-CM

## 2024-04-08 DIAGNOSIS — M170 Bilateral primary osteoarthritis of knee: Secondary | ICD-10-CM | POA: Diagnosis not present

## 2024-04-08 LAB — POCT URINALYSIS DIPSTICK
Bilirubin, UA: NEGATIVE
Blood, UA: NEGATIVE
Glucose, UA: NEGATIVE
Ketones, UA: NEGATIVE
Leukocytes, UA: NEGATIVE
Nitrite, UA: NEGATIVE
Protein, UA: NEGATIVE
Spec Grav, UA: 1.02 (ref 1.010–1.025)
Urobilinogen, UA: 0.2 U/dL
pH, UA: 7.5 (ref 5.0–8.0)

## 2024-04-08 MED ORDER — HYDROCORTISONE 0.5 % EX CREA: 1.0000 | TOPICAL_CREAM | Freq: Two times a day (BID) | CUTANEOUS | 0 refills | Status: AC

## 2024-04-08 MED ORDER — METHOCARBAMOL 500 MG PO TABS: 500.0000 mg | ORAL_TABLET | Freq: Three times a day (TID) | ORAL | 1 refills | Status: AC | PRN

## 2024-04-08 NOTE — Telephone Encounter (Signed)
 FYI Only or Action Required?: FYI only for provider.  Patient was last seen in primary care on 02/25/2024 by [name], NP.  Called Nurse Triage reporting Back Pain.  Symptoms began 2 weeks ago.  Interventions attempted: OTC medications: Tylenol .  Symptoms are: gradually worsening.  Triage Disposition: See PCP When Office is Open (Within 3 Days)  Patient/caregiver understands and will follow disposition?: Yes      Copied from CRM [id number]. Topic: Clinical - Red Word Triage >> Apr 08, 2024 11:49 AM Farrel B wrote: [facility] that prompted transfer to Nurse Triage: left side/back pain hurts when she attempts         Reason for Disposition  [1] MODERATE back pain (e.g., interferes with normal activities) AND [2] present > 3 days  Answer Assessment - Initial Assessment Questions 1. ONSET: When did the pain begin? (e.g., minutes, hours, days)     A couple of weeks ago  2. LOCATION: Where does it hurt? (upper, mid or lower back)     Left sided back  3. SEVERITY: How bad is the pain?  (e.g., Scale 1-10; mild, moderate, or severe)     7/10 4. PATTERN: Is the pain constant? (e.g., yes, no; constant, intermittent)      Constant  5. RADIATION: Does the pain shoot into your legs or somewhere else?     No radiation  6. CAUSE:  What do you think is causing the back pain?      Unsure  7. BACK OVERUSE:  Any recent lifting of heavy objects, strenuous work or exercise?     No 8. MEDICINES: What have you taken so far for the pain? (e.g., nothing, acetaminophen , NSAIDS)     Tylenol   9. NEUROLOGIC SYMPTOMS: Do you have any weakness, numbness, or problems with bowel/bladder control?     No 10. OTHER SYMPTOMS: Do you have any other symptoms? (e.g., fever, abdomen pain, burning with urination, blood in urine)       No  Protocols used: Back Pain-A-AH

## 2024-04-08 NOTE — Telephone Encounter (Signed)
 Appointment scheduled for 04/08/2024.

## 2024-04-09 LAB — URINE CULTURE
MICRO NUMBER:: 16994330
SPECIMEN QUALITY:: ADEQUATE

## 2024-04-09 LAB — CBC WITH DIFFERENTIAL/PLATELET
Absolute Lymphocytes: 766 {cells}/uL — ABNORMAL LOW (ref 850–3900)
Absolute Monocytes: 78 {cells}/uL — ABNORMAL LOW (ref 200–950)
Basophils Absolute: 10 {cells}/uL (ref 0–200)
Basophils Relative: 0.1 %
Eosinophils Absolute: 0 {cells}/uL — ABNORMAL LOW (ref 15–500)
Eosinophils Relative: 0 %
HCT: 39.3 % (ref 35.0–45.0)
Hemoglobin: 12.6 g/dL (ref 11.7–15.5)
MCH: 29.4 pg (ref 27.0–33.0)
MCHC: 32.1 g/dL (ref 32.0–36.0)
MCV: 91.6 fL (ref 80.0–100.0)
MPV: 11 fL (ref 7.5–12.5)
Monocytes Relative: 0.8 %
Neutro Abs: 8846 {cells}/uL — ABNORMAL HIGH (ref 1500–7800)
Neutrophils Relative %: 91.2 %
Platelets: 217 Thousand/uL (ref 140–400)
RBC: 4.29 Million/uL (ref 3.80–5.10)
RDW: 12.7 % (ref 11.0–15.0)
Total Lymphocyte: 7.9 %
WBC: 9.7 Thousand/uL (ref 3.8–10.8)

## 2024-04-09 LAB — LIPASE: Lipase: 6 U/L — ABNORMAL LOW (ref 7–60)

## 2024-04-09 LAB — AMYLASE: Amylase: 33 U/L (ref 21–101)

## 2024-04-12 ENCOUNTER — Encounter: Payer: Self-pay | Admitting: Nurse Practitioner

## 2024-04-12 ENCOUNTER — Ambulatory Visit: Payer: Self-pay | Admitting: Family

## 2024-04-12 ENCOUNTER — Telehealth (INDEPENDENT_AMBULATORY_CARE_PROVIDER_SITE_OTHER): Admitting: Nurse Practitioner

## 2024-04-12 ENCOUNTER — Telehealth: Payer: Self-pay | Admitting: *Deleted

## 2024-04-12 DIAGNOSIS — J4520 Mild intermittent asthma, uncomplicated: Secondary | ICD-10-CM | POA: Diagnosis not present

## 2024-04-12 NOTE — Progress Notes (Signed)
 Careteam: Patient Care Team: [name] Harlene POUR, NP as PCP - General (Geriatric Medicine) Michele Richardson, DO as PCP - Cardiology (Cardiology)  PLACE OF SERVICE:  [facility]  Advanced Directive information    Allergies  Allergen Reactions   Sodium Ferric Gluconate [Ferrous Gluconate] Other (See Comments)    Patient had near syncope with abdominal pain and diaphoresis about an hour after IV ferric gluconate although it was while she was on commode having bowel movements   Imdur [Isosorbide Nitrate]     headache   Tetracyclines & Related Itching    Chief Complaint  Patient presents with   Medical Concerns    To discuss MisDiagnosis. Cardiologist diagnosed with COPD in 2023 and then she went to Pulmonologist last Tuesday and had Pulmonary test done which was Negative for COPD and stated that she never had it. Stated that all she has is Asthma.     HPI: virtual visit.  Discussed the use of AI scribe software for clinical note transcription with the patient, who gave verbal consent to proceed.  History of Present Illness [name] [name] is a 63 year old female who presents with concerns about a previous COPD diagnosis and current asthma management.  In 2023, she was diagnosed with COPD by her cardiologist without any pulmonary testing. She was treated with medication for COPD, including prednisone , and experienced anxiety whenever she had a cough, fearing COPD exacerbations. She was treated for COPD exacerbations during hospital visits due to the diagnosis being in her chart.  On April 05, 2024, she underwent a pulmonary test at the request of her pulmonologist. She was told by her pulmonologist that she does not have COPD and never had it. Instead, she was diagnosed with intermittent asthma. Her asthma does not occur daily but flares up occasionally. She continues to take Breo as it treats both COPD and asthma.  She has been prescribed Singulair  to be taken daily, especially  during the fall to prevent flare-ups, but she has not been taking it regularly.  She is frustrated and distressed over the misdiagnosis of COPD, especially since she has never smoked and has lost two friends to COPD complications. She is upset about the impact of the misdiagnosis on her life and the unnecessary treatments she received.    Review of Systems:  Review of Systems  Constitutional:  Negative for chills, fever and weight loss.  Respiratory:  Negative for cough, sputum production and shortness of breath.   Cardiovascular:  Negative for chest pain.    Past Medical History:  Diagnosis Date   Anemia    Anxiety    Arthritis    Asthma    Bloody stool    Chronic diastolic heart failure (HCC)    COPD (chronic obstructive pulmonary disease) (HCC)    Dental decay    Dysrhythmia    Atrial flutter   Gastritis, Helicobacter pylori 06/22/2015   Hypertension    Paroxysmal atrial fibrillation (HCC)    Pneumonia 06/21/2015   Prepyloric ulcer 06/22/2015   Seasonal allergies    Sleep apnea    no CPAP   Past Surgical History:  Procedure Laterality Date   BARTHOLIN CYST MARSUPIALIZATION Left 07/22/1995   BIOPSY  11/04/2021   Procedure: BIOPSY;  Surgeon: Saintclair Jasper, MD;  Location: [facility];  Service: Gastroenterology;;   BUBBLE STUDY  10/31/2021   Procedure: BUBBLE STUDY;  Surgeon: Michele Richardson, DO;  Location: [facility];  Service: Cardiovascular;;   CARDIAC SURGERY N/A 07/21/1961  Repair of PFO   CARDIOVERSION N/A 10/31/2021   Procedure: CARDIOVERSION;  Surgeon: Michele Richardson, DO;  Location: [facility];  Service: Cardiovascular;  Laterality: N/A;   COLONOSCOPY N/A 07/21/2000   Performed secondary to mother's diagnosis of colon CA at age 89   COLONOSCOPY WITH PROPOFOL  N/A 11/04/2021   Procedure: COLONOSCOPY WITH PROPOFOL ;  Surgeon: Saintclair Jasper, MD;  Location: [facility];  Service: Gastroenterology;  Laterality: N/A;   ESOPHAGOGASTRODUODENOSCOPY (EGD) WITH PROPOFOL  N/A  06/22/2015   Procedure: ESOPHAGOGASTRODUODENOSCOPY (EGD) WITH PROPOFOL ;  Surgeon: Elsie Cree, MD;  Location: [facility];  Service: Endoscopy;  Laterality: N/A;   ESOPHAGOGASTRODUODENOSCOPY (EGD) WITH PROPOFOL  N/A 11/04/2021   Procedure: ESOPHAGOGASTRODUODENOSCOPY (EGD) WITH PROPOFOL ;  Surgeon: Saintclair Jasper, MD;  Location: [facility];  Service: Gastroenterology;  Laterality: N/A;   FRACTURE SURGERY Left 2005   ORIF tibia   NASAL SINUS SURGERY Bilateral 07/21/1982   OVARIAN CYST REMOVAL Left 07/21/1988   POLYPECTOMY  11/04/2021   Procedure: POLYPECTOMY;  Surgeon: Saintclair Jasper, MD;  Location: [facility];  Service: Gastroenterology;;   TEE WITHOUT CARDIOVERSION N/A 10/31/2021   Procedure: TRANSESOPHAGEAL ECHOCARDIOGRAM (TEE);  Surgeon: Michele Richardson, DO;  Location: [facility];  Service: Cardiovascular;  Laterality: N/A;   TOTAL HIP ARTHROPLASTY Right 04/22/2023   Procedure: TOTAL HIP ARTHROPLASTY ANTERIOR APPROACH;  Surgeon: Fidel Rogue, MD;  Location: [facility];  Service: Orthopedics;  Laterality: Right;  140   TUBAL LIGATION     UMBILICAL HERNIA REPAIR N/A 07/22/2003   Social History:   reports that she has never smoked. She has never used smokeless tobacco. She reports that she does not currently use alcohol . She reports that she does not use drugs.  Family History  Problem Relation Age of Onset   Colon cancer Mother 67   Hypertension Mother    Cancer Father 30       liver cancer   Hypertension Father    Cerebral aneurysm Sister    Goiter Sister    Breast cancer Neg Hx     Medications: Patient's Medications  New Prescriptions   No medications on file  Previous Medications   ALBUTEROL  (PROVENTIL ) (2.5 MG/3ML) 0.083% NEBULIZER SOLUTION    Take 3 mLs (2.5 mg total) by nebulization every 6 (six) hours as needed for wheezing or shortness of breath.   ALBUTEROL  (VENTOLIN  HFA) 108 (90 BASE) MCG/ACT INHALER    Inhale 2 puffs into the lungs every 6 (six) hours as needed for  wheezing or shortness of breath.   ATORVASTATIN  (LIPITOR) 20 MG TABLET    TAKE 1 TABLET (20 MG TOTAL) BY MOUTH AT BEDTIME FOR CHOLESTEROL   BIOFLAVONOID PRODUCTS (VITAMIN C PLUS PO)    Take 1 tablet by mouth daily.   BLOOD PRESSURE MONITORING (OMRON 3 SERIES BP MONITOR) DEVI    Monitor blood pressure daily.   BREO ELLIPTA  200-25 MCG/ACT AEPB    TAKE 1 PUFF BY MOUTH EVERY DAY   CHOLECALCIFEROL  (VITAMIN D3) 25 MCG (1000 UNIT) TABLET    Take 1,000 Units by mouth in the morning.   ELIQUIS  5 MG TABS TABLET    TAKE 1 TABLET BY MOUTH TWICE A DAY   HYDROCORTISONE  CREAM 0.5 %    Apply 1 Application topically 2 (two) times daily.   METHOCARBAMOL  (ROBAXIN ) 500 MG TABLET    Take 1 tablet (500 mg total) by mouth every 8 (eight) hours as needed for muscle spasms.   METOPROLOL  SUCCINATE (TOPROL -XL) 25 MG 24 HR TABLET    TAKE 1 TABLET (25 MG  TOTAL) BY MOUTH DAILY.   MONTELUKAST  (SINGULAIR ) 10 MG TABLET    Take 1 tablet (10 mg total) by mouth at bedtime.   OXYGEN     Inhale 2 L/min into the lungs as needed (wheezing/respiratory issues.).   SACUBITRIL -VALSARTAN  (ENTRESTO ) 49-51 MG    Take 1 tablet by mouth 2 (two) times daily.   SPIRONOLACTONE  (ALDACTONE ) 25 MG TABLET    Take 1 tablet (25 mg total) by mouth daily.   TRAZODONE  (DESYREL ) 50 MG TABLET    TAKE 1 TABLET BY MOUTH EVERYDAY AT BEDTIME  Modified Medications   No medications on file  Discontinued Medications   No medications on file    Physical Exam:  There were no vitals filed for this visit. There is no height or weight on file to calculate BMI. Wt Readings from Last 3 Encounters:  04/08/24 207 lb 3.2 oz (94 kg)  04/05/24 206 lb 6.4 oz (93.6 kg)  02/16/24 200 lb (90.7 kg)    Physical Exam Constitutional:      Appearance: Normal appearance.  Pulmonary:     Effort: Pulmonary effort is normal.  Neurological:     Mental Status: She is alert and oriented to person, place, and time. Mental status is at baseline.  Psychiatric:        Mood and  Affect: Mood normal.     Labs reviewed: Basic Metabolic Panel: Recent Labs    09/05/23 1641 10/26/23 0904 03/01/24 1406  NA 135 139 141  K 3.1* 3.4* 3.9  CL 101 105 101  CO2 24 22 21   GLUCOSE 122* 98 103*  BUN 9 12 13   CREATININE 0.59 0.47 0.63  CALCIUM  9.0 8.7* 9.3   Liver Function Tests: Recent Labs    07/17/23 0955 09/05/23 1641 10/26/23 0904  AST 14 22 19   ALT 9 17 15   ALKPHOS  --  55 59  BILITOT 0.4 0.7 0.8  PROT 7.1 7.5 7.1  ALBUMIN  --  4.0 3.8   Recent Labs    04/08/24 1354  LIPASE 6*  AMYLASE 33   No results for input(s): AMMONIA in the last 8760 hours. CBC: Recent Labs    09/05/23 1641 10/26/23 0904 04/08/24 1354  WBC 5.7 8.0 9.7  NEUTROABS 4.1 4.4 8,846*  HGB 11.1* 11.1* 12.6  HCT 35.9* 34.6* 39.3  MCV 89.3 90.8 91.6  PLT 184 230 217   Lipid Panel: Recent Labs    07/17/23 0955  CHOL 180  HDL 63  LDLCALC 101*  TRIG 71  CHOLHDL 2.9   TSH: No results for input(s): TSH in the last 8760 hours. A1C: Lab Results  Component Value Date   HGBA1C 5.6 10/30/2021     Assessment/Plan  Assessment and Plan Assessment & Plan Mild intermittent asthma Recent pulmonary testing confirmed mild intermittent asthma. Current treatment effective for asthma. -it was recommended to continue Breo as prescribed however she is unsure if she will continue.  Discussed the diagnosis of asthma with her also recommended to continue Singulair  daily  Recommend to keep follow up with pulmonary    Austyn Perriello K. [name] BODILY [facility] [phone]    Virtual Visit via Video Note  I connected with BEVIN DAS on 04/12/24 at  8:20 AM EDT by a video enabled telemedicine application and verified that I am speaking with the correct person using two identifiers.  Location: Patient: home Provider: [facility]   I discussed the limitations of evaluation and management by telemedicine and  the availability of in person  appointments. The patient expressed understanding and agreed to proceed.    I discussed the assessment and treatment plan with the patient. The patient was provided an opportunity to ask questions and all were answered. The patient agreed with the plan and demonstrated an understanding of the instructions.   The patient was advised to call back or seek an in-person evaluation if the symptoms worsen or if the condition fails to improve as anticipated.  I provided 15 minutes of non-face-to-face time during this encounter.  Kaitlinn Iversen K. [name], AGNP Avs printed and mailed.

## 2024-04-12 NOTE — Telephone Encounter (Signed)
 [name], [name] [name] are scheduled for a virtual visit with your provider today.    Just as we do with appointments in the office, we must obtain your consent to participate.  Your consent will be active for this visit and any virtual visit you [name] [name] have with one of our providers in the next 365 days.    If you have a MyChart account, I can also send a copy of this consent to you electronically.  All virtual visits are billed to your insurance company just like a traditional visit in the office.  As this is a virtual visit, video technology does not allow for your provider to perform a traditional examination.  This [name] [name] limit your provider's ability to fully assess your condition.  If your provider identifies any concerns that need to be evaluated in person or the need to arrange testing such as labs, EKG, etc, we will make arrangements to do so.    Although advances in technology are sophisticated, we cannot ensure that it will always work on either your end or our end.  If the connection with a video visit is poor, we [name] [name] have to switch to a telephone visit.  With either a video or telephone visit, we are not always able to ensure that we have a secure connection.   I need to obtain your verbal consent now.   Are you willing to proceed with your visit today?   [name] [name] has provided verbal consent on 04/12/2024 for a virtual visit (video or telephone).   [name] [name], CMA 04/12/2024  8:28 AM

## 2024-04-12 NOTE — Progress Notes (Signed)
 This service is provided via telemedicine  No vital signs collected/recorded due to the encounter was a telemedicine visit.   Location of patient (ex: home, work):  Home  Patient consents to a telephone visit:  Yes  Location of the provider (ex: office, home):  Office [place].   Name of any referring provider:  na  Names of all persons participating in the telemedicine service and their role in the encounter:  [name] [name], Patient, Donzell Beal, CMA, Harlene An, NP  Time spent on call:  7:10

## 2024-04-12 NOTE — Progress Notes (Signed)
 Provider: Anyiah Coverdale FNP-C  [name], NP  Patient Care Team: [name], NP as PCP - General (Geriatric Medicine) Michele Richardson, DO as PCP - Cardiology (Cardiology)  Extended Emergency Contact Information Primary Emergency Contact: Threat,conchetta  [place] Mobile Phone: [phone] Relation: Daughter Preferred language: English Interpreter needed? No Secondary Emergency Contact: Gruner,eugene Mobile Phone: [phone] Relation: Son  Code Status: Full Code  Goals of care: Advanced Directive information    02/25/2024    9:04 AM  Advanced Directives  Does Patient Have a Medical Advance Directive? Yes  Type of Advance Directive Out of facility DNR (pink MOST or yellow form)  Does patient want to make changes to medical advance directive? No - Patient declined     Chief Complaint  Patient presents with   Back Pain    Left sided back pain.    Discussed the use of AI scribe software for clinical note transcription with the patient, who gave verbal consent to proceed.  History of Present Illness   [name] [name] is a 63 year old female who presents with severe left-sided abdominal and back pain.  She has been experiencing left-sided abdominal pain for approximately two weeks. Initially, the pain was bearable but worsened significantly yesterday. The pain is described as dull, starting on the left side and moving to the back. It became severe after receiving knee injections and attempting to exit her truck, causing intense discomfort.  She denies any urinary issues, including hematuria. She has been taking Miralax  due to concerns about constipation, although she notes the pain is not consistent with typical constipation symptoms. Tylenol  has been used for pain relief but has been ineffective. No nausea or vomiting is reported. The pain radiates from the abdomen to the back.  In 2023, she was diagnosed with COPD by a cardiologist without  diagnostic tests, but recent pulmonary tests indicated she only has asthma. She has no history of smoking but was previously hospitalized for fluid in her lungs related to alcohol  use. Currently, she is not experiencing any cough or shortness of breath. Her medication history includes a past hip replacement surgery where she was prescribed pain medication that caused constipation, making her cautious about new medications.   Past Medical History:  Diagnosis Date   Anemia    Anxiety    Arthritis    Asthma    Bloody stool    Chronic diastolic heart failure (HCC)    COPD (chronic obstructive pulmonary disease) (HCC)    Dental decay    Dysrhythmia    Atrial flutter   Gastritis, Helicobacter pylori 06/22/2015   Hypertension    Paroxysmal atrial fibrillation (HCC)    Pneumonia 06/21/2015   Prepyloric ulcer 06/22/2015   Seasonal allergies    Sleep apnea    no CPAP   Past Surgical History:  Procedure Laterality Date   BARTHOLIN CYST MARSUPIALIZATION Left 07/22/1995   BIOPSY  11/04/2021   Procedure: BIOPSY;  Surgeon: Saintclair Jasper, MD;  Location: [facility];  Service: Gastroenterology;;   BUBBLE STUDY  10/31/2021   Procedure: BUBBLE STUDY;  Surgeon: Michele Richardson, DO;  Location: [facility];  Service: Cardiovascular;;   CARDIAC SURGERY N/A 07/21/1961   Repair of PFO   CARDIOVERSION N/A 10/31/2021   Procedure: CARDIOVERSION;  Surgeon: Michele Richardson, DO;  Location: [facility];  Service: Cardiovascular;  Laterality: N/A;   COLONOSCOPY N/A 07/21/2000   Performed secondary to mother's diagnosis of colon CA at age 28   COLONOSCOPY WITH  PROPOFOL  N/A 11/04/2021   Procedure: COLONOSCOPY WITH PROPOFOL ;  Surgeon: Saintclair Jasper, MD;  Location: [facility];  Service: Gastroenterology;  Laterality: N/A;   ESOPHAGOGASTRODUODENOSCOPY (EGD) WITH PROPOFOL  N/A 06/22/2015   Procedure: ESOPHAGOGASTRODUODENOSCOPY (EGD) WITH PROPOFOL ;  Surgeon: Elsie Cree, MD;  Location[facility];  Service: Endoscopy;   Laterality: N/A;   ESOPHAGOGASTRODUODENOSCOPY (EGD) WITH PROPOFOL  N/A 11/04/2021   Procedure: ESOPHAGOGASTRODUODENOSCOPY (EGD) WITH PROPOFOL ;  Surgeon: Saintclair Jasper, MD;  Location: [facility];  Service: Gastroenterology;  Laterality: N/A;   FRACTURE SURGERY Left 2005   ORIF tibia   NASAL SINUS SURGERY Bilateral 07/21/1982   OVARIAN CYST REMOVAL Left 07/21/1988   POLYPECTOMY  11/04/2021   Procedure: POLYPECTOMY;  Surgeon: Saintclair Jasper, MD;  Location: [facility];  Service: Gastroenterology;;   TEE WITHOUT CARDIOVERSION N/A 10/31/2021   Procedure: TRANSESOPHAGEAL ECHOCARDIOGRAM (TEE);  Surgeon: Michele Richardson, DO;  Location: [facility];  Service: Cardiovascular;  Laterality: N/A;   TOTAL HIP ARTHROPLASTY Right 04/22/2023   Procedure: TOTAL HIP ARTHROPLASTY ANTERIOR APPROACH;  Surgeon: Fidel Rogue, MD;  Location: [facility];  Service: Orthopedics;  Laterality: Right;  140   TUBAL LIGATION     UMBILICAL HERNIA REPAIR N/A 07/22/2003    Allergies  Allergen Reactions   Sodium Ferric Gluconate [Ferrous Gluconate] Other (See Comments)    Patient had near syncope with abdominal pain and diaphoresis about an hour after IV ferric gluconate although it was while she was on commode having bowel movements   Imdur [Isosorbide Nitrate]     headache   Tetracyclines & Related Itching    Outpatient Encounter Medications as of 04/08/2024  Medication Sig   albuterol  (PROVENTIL ) (2.5 MG/3ML) 0.083% nebulizer solution Take 3 mLs (2.5 mg total) by nebulization every 6 (six) hours as needed for wheezing or shortness of breath.   albuterol  (VENTOLIN  HFA) 108 (90 Base) MCG/ACT inhaler Inhale 2 puffs into the lungs every 6 (six) hours as needed for wheezing or shortness of breath.   atorvastatin  (LIPITOR) 20 MG tablet TAKE 1 TABLET (20 MG TOTAL) BY MOUTH AT BEDTIME FOR CHOLESTEROL   Bioflavonoid Products (VITAMIN C PLUS PO) Take 1 tablet by mouth daily.   Blood Pressure Monitoring (OMRON 3 SERIES BP MONITOR) DEVI  Monitor blood pressure daily.   BREO ELLIPTA  200-25 MCG/ACT AEPB TAKE 1 PUFF BY MOUTH EVERY DAY   cholecalciferol  (VITAMIN D3) 25 MCG (1000 UNIT) tablet Take 1,000 Units by mouth in the morning.   ELIQUIS  5 MG TABS tablet TAKE 1 TABLET BY MOUTH TWICE A DAY   hydrocortisone  cream 0.5 % Apply 1 Application topically 2 (two) times daily.   methocarbamol  (ROBAXIN ) 500 MG tablet Take 1 tablet (500 mg total) by mouth every 8 (eight) hours as needed for muscle spasms.   metoprolol  succinate (TOPROL -XL) 25 MG 24 hr tablet TAKE 1 TABLET (25 MG TOTAL) BY MOUTH DAILY.   montelukast  (SINGULAIR ) 10 MG tablet Take 1 tablet (10 mg total) by mouth at bedtime.   OXYGEN  Inhale 2 L/min into the lungs as needed (wheezing/respiratory issues.).   sacubitril -valsartan  (ENTRESTO ) 49-51 MG Take 1 tablet by mouth 2 (two) times daily.   traZODone  (DESYREL ) 50 MG tablet TAKE 1 TABLET BY MOUTH EVERYDAY AT BEDTIME   [DISCONTINUED] spironolactone  (ALDACTONE ) 25 MG tablet Take 1 tablet (25 mg total) by mouth daily. (Patient not taking: Reported on 04/12/2024)   No facility-administered encounter medications on file as of 04/08/2024.    Review of Systems  Constitutional:  Negative for appetite change, chills, fatigue, fever and unexpected  weight change.  HENT:  Negative for congestion, ear discharge, ear pain, hearing loss, nosebleeds, postnasal drip, rhinorrhea, sinus pressure, sinus pain, sneezing, sore throat and tinnitus.   Eyes:  Negative for pain, discharge, redness, itching and visual disturbance.  Respiratory:  Negative for cough, chest tightness, shortness of breath and wheezing.   Cardiovascular:  Negative for chest pain, palpitations and leg swelling.  Gastrointestinal:  Positive for abdominal pain. Negative for abdominal distention, blood in stool, constipation, diarrhea, nausea and vomiting.  Genitourinary:  Negative for difficulty urinating, dysuria, flank pain, frequency and urgency.  Musculoskeletal:  Positive  for back pain. Negative for arthralgias, gait problem, joint swelling, myalgias, neck pain and neck stiffness.       Left sided back pain   Skin:  Positive for rash. Negative for color change, pallor and wound.       Skin rash on the neck from wearing necklace   Neurological:  Negative for dizziness, syncope, speech difficulty, weakness, light-headedness, numbness and headaches.    Immunization History  Administered Date(s) Administered   PPD Test 03/12/2015   Tdap 03/27/2015   Pertinent  Health Maintenance Due  Topic Date Due   Influenza Vaccine  10/18/2024 (Originally 02/19/2024)   Mammogram  10/05/2025   Colonoscopy  11/05/2031      10/16/2023   10:49 AM 11/26/2023    8:54 AM 11/30/2023   10:30 AM 02/25/2024    9:03 AM 04/12/2024    8:23 AM  Fall Risk  Falls in the past year? 0 0 0 0 1  Was there an injury with Fall? 0  0 0 1  Fall Risk Category Calculator 0  0 0 3  Patient at Risk for Falls Due to No Fall Risks  No Fall Risks No Fall Risks No Fall Risks  Fall risk Follow up Falls evaluation completed  Falls prevention discussed;Falls evaluation completed Falls evaluation completed Falls evaluation completed   Functional Status Survey:    Vitals:   04/08/24 1311  BP: 132/82  Pulse: 60  Resp: 20  Temp: 97.9 F (36.6 C)  SpO2: 98%  Weight: 207 lb 3.2 oz (94 kg)  Height: 5' 3 (1.6 m)   Body mass index is 36.7 kg/m. Physical Exam  GENERAL: Alert, cooperative, well developed, no acute distress. HEENT: Normocephalic, normal oropharynx, moist mucous membranes. CHEST: Clear to auscultation bilaterally, no wheezes, rhonchi, or crackles. CARDIOVASCULAR: Normal heart rate and rhythm, S1 and S2 normal without murmurs. ABDOMEN: Tenderness in left upper abdomen. Soft, non-distended, without organomegaly, normal bowel sounds. EXTREMITIES: No cyanosis or edema. MUSCULOSKELETAL: No midline tenderness in the spine. NEUROLOGICAL: Cranial nerves grossly intact, moves all extremities  without gross motor or sensory deficit.  SKIN: grayish colored non-raised rash around the neck,no lesion or erythema   PSYCHIATRY/BEHAVIORAL: Mood stable   Labs reviewed: Recent Labs    09/05/23 1641 10/26/23 0904 03/01/24 1406  NA 135 139 141  K 3.1* 3.4* 3.9  CL 101 105 101  CO2 24 22 21   GLUCOSE 122* 98 103*  BUN 9 12 13   CREATININE 0.59 0.47 0.63  CALCIUM  9.0 8.7* 9.3   Recent Labs    07/17/23 0955 09/05/23 1641 10/26/23 0904  AST 14 22 19   ALT 9 17 15   ALKPHOS  --  55 59  BILITOT 0.4 0.7 0.8  PROT 7.1 7.5 7.1  ALBUMIN  --  4.0 3.8   Recent Labs    09/05/23 1641 10/26/23 0904 04/08/24 1354  WBC 5.7 8.0 9.7  NEUTROABS  4.1 4.4 8,846*  HGB 11.1* 11.1* 12.6  HCT 35.9* 34.6* 39.3  MCV 89.3 90.8 91.6  PLT 184 230 217   Lab Results  Component Value Date   TSH 0.47 08/20/2022   Lab Results  Component Value Date   HGBA1C 5.6 10/30/2021   Lab Results  Component Value Date   CHOL 180 07/17/2023   HDL 63 07/17/2023   LDLCALC 101 (H) 07/17/2023   TRIG 71 07/17/2023   CHOLHDL 2.9 07/17/2023    Significant Diagnostic Results in last 30 days:  No results found.  Assessment/Plan  Left-sided back and abdominal pain Dull pain on the left side, starting in the lower abdomen and moving to the back, persisting for two weeks. Severe pain upon movement. No urinary or gastrointestinal symptoms. Tenderness in the left lower back suggests musculoskeletal origin. Differential diagnosis includes musculoskeletal pain versus renal or pancreatic issues. Urinalysis shows no infection. - Send urine for culture to rule out infection - Order lipase, amylase, and CBC to assess for pancreatic issues - Prescribe Robaxin  500 mg for musculoskeletal pain, to be taken every 8 hours as needed - Advise to eat before taking pain medication to avoid stomach irritation  Asthma Previously misdiagnosed with COPD, now confirmed to have asthma. No current symptoms of cough or shortness of  breath. No smoking history.  Allergic contact dermatitis of neck Rash on the neck after wearing cheap jewelry, which is itchy. - Prescribe hydrocortisone  cream to be applied lightly twice daily for seven days - Advise to avoid sun exposure after application of hydrocortisone  cream - Instruct to discontinue use of the offending jewelry   Family/ staff Communication: Reviewed plan of care with patient verbalized understanding   Labs/tests ordered:  - CBC/diff  - Amylase - Lipase  - Urine Culture; Future - POC Urinalysis Dipstick   Next Appointment: Return in about 2 months (around 06/08/2024), or if symptoms worsen or fail to improve, for medical mangement of chronic issues with [name] .   Total time: 30 minutes. Greater than 50% of total time spent doing patient education regarding Left sided back pain,Abd pain,Asthma Allergic, contact dermatitis,health maintenance including symptom/medication management.   Roxan JAYSON Plough, NP

## 2024-04-15 NOTE — Telephone Encounter (Signed)
 Please see lab result encounter

## 2024-04-15 NOTE — Telephone Encounter (Signed)
 Copied from CRM #[id number]. Topic: Clinical - Lab/Test Results >> Apr 15, 2024 12:52 PM Miquel SAILOR wrote: Reason for CRM: Patient returning call from office. Labs done on 09/23. Called office put on hold,while on hold PT hung up. PT needs call back [phone]

## 2024-04-19 ENCOUNTER — Other Ambulatory Visit: Payer: Self-pay | Admitting: Nurse Practitioner

## 2024-04-24 DIAGNOSIS — J9600 Acute respiratory failure, unspecified whether with hypoxia or hypercapnia: Secondary | ICD-10-CM | POA: Diagnosis not present

## 2024-04-24 DIAGNOSIS — J45909 Unspecified asthma, uncomplicated: Secondary | ICD-10-CM | POA: Diagnosis not present

## 2024-04-29 NOTE — Progress Notes (Deleted)
 Patient ID: [name] [name]                 DOB: 01/29/1961                      MRN: [id number]     HPI: [name] [name] is a 63 y.o. female referred by Dr. Michele to pharmacy clinic for HF medication management. PMH is significant for Persistent atrial fibrillation status post direct-current cardioversion, aortic atherosclerosis, hypertension, asthma, heart failure with improved EF / HFpEF,  sleep apnea not on CPAP, COPD, history of alcohol  abuse, postmenopause. Most recent LVEF 60-65%  LVEF has improved from 25-30% to 60-65% with grade 2 diastolic dysfunction.  Patient tried Farxiga  in the past, did not feel right so stopped. Did not tolerated BiDil ( isosorbide dinitrate and hydralazine ) well due to headaches and vision changes. Entresto  dose was increased to 49/51 last month and post BMP was WNL and NT Pro BNP was 187.  03/29/24 visit BP was elevated spironolactone  25 mg daily was added to other HF meds. Pt was asymptomatic. Post MRA start BMP is due today   Patient presented today for HF meds optimization.  Symptomatically, she is feeling well, denies  dizziness, lightheadedness, and fatigue. denies chest pain or palpitations. Able to complete all ADLs. She does not  checks her weight at home . denies LEE, PND, or orthopnea. Appetite has been normal.     Current CHF meds: metoprolol  XL 25 mg daily, Entresto  49/51 mg twice daily, spironolactone  25 mg daily  Previously tried: Farxiga - tiredness  Adherence Assessment  Do you ever forget to take your medication? [] Yes [x] No  Do you ever skip doses due to side effects? [] Yes [x] No  Do you have trouble affording your medicines? [] Yes [x] No  Are you ever unable to pick up your medication due to transportation difficulties? [] Yes [x] No  Do you ever stop taking your medications because you don't believe they are helping? [] Yes [x] No  Do you check your weight daily? [] Yes [x] No   Adherence strategy: remember to take it   Barriers to obtaining  medications: none   BP goal: <130/80   Family History:  Relation Problem Comments  Mother Metallurgist) Colon cancer (Age: 55)   Hypertension     Father (Deceased at age 82) Cancer (Age: 40) liver cancer  Hypertension     Sister (Deceased at age 3) Cerebral aneurysm     Sister (Alive) Goiter     Social History:  Alcohol : quit 03/2021  Smoking: never   Diet: heart healthy diet  Eats out every 3 week   Exercise: stays active with grand daugher but no structured exercise schedule - willing to start 15 min walks 4-5 days per week and resistance chair exercise 2 days per week   Home BP readings: does not check at home   Wt Readings from Last 3 Encounters:  04/08/24 207 lb 3.2 oz (94 kg)  04/05/24 206 lb 6.4 oz (93.6 kg)  02/16/24 200 lb (90.7 kg)   BP Readings from Last 3 Encounters:  04/08/24 132/82  04/05/24 132/70  03/29/24 (!) 172/92   Pulse Readings from Last 3 Encounters:  04/08/24 60  04/05/24 (!) 58  03/29/24 65    Renal function: CrCl cannot be calculated (Patient's most recent lab result is older than the maximum 21 days allowed.).  Past Medical History:  Diagnosis Date   Anemia    Anxiety    Arthritis    Asthma  Bloody stool    Chronic diastolic heart failure (HCC)    COPD (chronic obstructive pulmonary disease) (HCC)    Dental decay    Dysrhythmia    Atrial flutter   Gastritis, Helicobacter pylori 06/22/2015   Hypertension    Paroxysmal atrial fibrillation (HCC)    Pneumonia 06/21/2015   Prepyloric ulcer 06/22/2015   Seasonal allergies    Sleep apnea    no CPAP    Current Outpatient Medications on File Prior to Visit  Medication Sig Dispense Refill   albuterol  (PROVENTIL ) (2.5 MG/3ML) 0.083% nebulizer solution Take 3 mLs (2.5 mg total) by nebulization every 6 (six) hours as needed for wheezing or shortness of breath. 150 mL 6   albuterol  (VENTOLIN  HFA) 108 (90 Base) MCG/ACT inhaler Inhale 2 puffs into the lungs every 6 (six) hours as needed  for wheezing or shortness of breath. 8.5 g 11   atorvastatin  (LIPITOR) 20 MG tablet TAKE 1 TABLET (20 MG TOTAL) BY MOUTH AT BEDTIME FOR CHOLESTEROL 90 tablet 1   Bioflavonoid Products (VITAMIN C PLUS PO) Take 1 tablet by mouth daily.     Blood Pressure Monitoring (OMRON 3 SERIES BP MONITOR) DEVI Monitor blood pressure daily. 1 each 0   BREO ELLIPTA  200-25 MCG/ACT AEPB TAKE 1 PUFF BY MOUTH EVERY DAY 60 each 1   cholecalciferol  (VITAMIN D3) 25 MCG (1000 UNIT) tablet Take 1,000 Units by mouth in the morning.     ELIQUIS  5 MG TABS tablet TAKE 1 TABLET BY MOUTH TWICE A DAY 180 tablet 1   hydrocortisone  cream 0.5 % Apply 1 Application topically 2 (two) times daily. 30 g 0   methocarbamol  (ROBAXIN ) 500 MG tablet Take 1 tablet (500 mg total) by mouth every 8 (eight) hours as needed for muscle spasms. 30 tablet 1   metoprolol  succinate (TOPROL -XL) 25 MG 24 hr tablet TAKE 1 TABLET (25 MG TOTAL) BY MOUTH DAILY. 30 tablet 3   montelukast  (SINGULAIR ) 10 MG tablet Take 1 tablet (10 mg total) by mouth at bedtime. 90 tablet 1   OXYGEN  Inhale 2 L/min into the lungs as needed (wheezing/respiratory issues.).     sacubitril -valsartan  (ENTRESTO ) 49-51 MG Take 1 tablet by mouth 2 (two) times daily. 60 tablet 3   traZODone  (DESYREL ) 50 MG tablet TAKE 1 TABLET BY MOUTH EVERYDAY AT BEDTIME 30 tablet 11   No current facility-administered medications on file prior to visit.    Allergies  Allergen Reactions   Sodium Ferric Gluconate [Ferrous Gluconate] Other (See Comments)    Patient had near syncope with abdominal pain and diaphoresis about an hour after IV ferric gluconate although it was while she was on commode having bowel movements   Imdur [Isosorbide Nitrate]     headache   Tetracyclines & Related Itching     Assessment/Plan:  1. CHF -  No problem-specific Assessment & Plan notes found for this encounter.  There are no diagnoses linked to this encounter.     Thank you   Robbi Blanch,  Pharm.D [place] [facility]. [facility] [address], [place], [place] [address] Phone: [phone]; Fax: [phone]

## 2024-05-02 ENCOUNTER — Ambulatory Visit: Attending: Pharmacist | Admitting: Pharmacist

## 2024-05-02 ENCOUNTER — Telehealth

## 2024-05-02 NOTE — Telephone Encounter (Signed)
 That is dictated by her insurance, we can try ordering Alice HST through our office  Other option she can call her insurance and ask if they cover in lab sleep study

## 2024-05-02 NOTE — Telephone Encounter (Unsigned)
 Copied from CRM [id number]. Topic: Clinical - Request for Lab/Test Order >> Apr 28, 2024  3:15 PM Russell PARAS wrote: Reason for CRM:   Pt wanted notify Hope she has canceled in home sleep study with [employer], due to the company requesting a partial payment up front prior to the testing. She does not want to have pay a copay for this testing, and if there are other options to have the study performed she is open to it.  CB#  [phone]

## 2024-05-03 NOTE — Telephone Encounter (Signed)
 I will tell patient to call her insurance and ask if they would cover an in lab sleep study and what her out of pocket cost would be, unfortunately she has a high deductible and has a remaining balance that she has not met

## 2024-05-04 NOTE — Telephone Encounter (Signed)
 Home sleep study would be cheapest, if is her decision if she wants to proceed with sleep study or not  She has a hx of severe obstructive sleep apnea diagnosed in 2023 with 63 apneic events per hour. CPAP intolerance due to discomfort. Weight loss noted since last evaluation. I do recommend repeating to re-assess the severity but if she is sleeping well and not having symptoms such as snoring, waking up gasping or excessive daytime sleepiness than she can refuse and we just need to document this

## 2024-05-05 NOTE — Telephone Encounter (Signed)
 Pt has been scheduled for an Alice hst and has been made aware of appt and details. NFN

## 2024-06-01 ENCOUNTER — Ambulatory Visit

## 2024-06-01 DIAGNOSIS — G4730 Sleep apnea, unspecified: Secondary | ICD-10-CM

## 2024-06-01 DIAGNOSIS — G4734 Idiopathic sleep related nonobstructive alveolar hypoventilation: Secondary | ICD-10-CM

## 2024-06-05 ENCOUNTER — Other Ambulatory Visit: Payer: Self-pay | Admitting: Nurse Practitioner

## 2024-06-05 DIAGNOSIS — I480 Paroxysmal atrial fibrillation: Secondary | ICD-10-CM

## 2024-06-05 DIAGNOSIS — I5032 Chronic diastolic (congestive) heart failure: Secondary | ICD-10-CM

## 2024-06-05 DIAGNOSIS — I10 Essential (primary) hypertension: Secondary | ICD-10-CM

## 2024-06-14 ENCOUNTER — Ambulatory Visit: Payer: Self-pay | Admitting: Primary Care

## 2024-06-14 DIAGNOSIS — G4733 Obstructive sleep apnea (adult) (pediatric): Secondary | ICD-10-CM | POA: Diagnosis not present

## 2024-06-15 NOTE — Progress Notes (Signed)
 ATC x1, unable to LVM. MyChart message has already been sent by Landry Ferrari, NP, and read by pt. NFN.

## 2024-06-21 ENCOUNTER — Other Ambulatory Visit: Payer: Self-pay | Admitting: Nurse Practitioner

## 2024-06-21 ENCOUNTER — Other Ambulatory Visit: Payer: Self-pay | Admitting: Cardiology

## 2024-06-21 DIAGNOSIS — I5020 Unspecified systolic (congestive) heart failure: Secondary | ICD-10-CM

## 2024-06-29 ENCOUNTER — Encounter: Payer: Self-pay | Admitting: Cardiology

## 2024-07-01 ENCOUNTER — Ambulatory Visit: Payer: Self-pay | Admitting: Primary Care

## 2024-07-01 NOTE — Telephone Encounter (Signed)
 FYI Only or Action Required?: Action required by provider: clinical question for provider and update on patient condition.  Patient is followed in Pulmonology for OSA, last seen on 04/05/2024 by [name] ORN, NP.  Called Nurse Triage reporting Shortness of Breath.  Symptoms began yesterday.  Interventions attempted: Rescue inhaler and Maintenance inhaler.  Symptoms are: unchanged.  Triage Disposition: See PCP Within 2 Weeks  Patient/caregiver understands and will follow disposition?: Yes   Copied from CRM #[id number]. Topic: Clinical - Red Word Triage >> Jul 01, 2024  3:08 PM [name] [name] wrote: Red Word that prompted transfer to Nurse Triage: SOB and wheezing, states she called ambulance last night and was given [name] breathing treatment that helped [name] little but is still experiencing some symptoms - she was also advised to have [name] nebulizer prescribed by her provider Reason for Disposition  [1] MILD longstanding difficulty breathing (e.g., minimal/no SOB at rest, SOB with walking, pulse < 100) AND [2] SAME as normal  Answer Assessment - Initial Assessment Questions Pt called in requesting at home nebulizers. Pt states she has had [name] cold for the past few weeks, causing wheezing and SOB. States last night was so severe, she called EMS. Pt was given [name] breathing tx which did help and pt reports no symptoms today, feeling much better. States she is still using her albuterol  inhaler but is trying to ween Breo inhaler d/t side effects. Pt does have appt 12/16 but wanted to update provider and ask for neb tx to be sent in incase wheezing occurs again. Please advise.     1. RESPIRATORY STATUS: Describe your breathing? (e.g., wheezing, shortness of breath, unable to speak, severe coughing)      SOB; wheezing.   2. ONSET: When did this breathing problem begin?      Pt reports having [name] cold the past few weeks so it has made breathing worse   3. PATTERN Does the difficult breathing come and go,  or has it been constant since it started?      Comes and goes   4. SEVERITY: How bad is your breathing? (e.g., mild, moderate, severe)      Mild; pt reports feeling much better   5. RECURRENT SYMPTOM: Have you had difficulty breathing before? If Yes, ask: When was the last time? and What happened that time?      Yes; hx of OSA, at one point dx of COPD but was told she does not have COPD so she is getting further pulm work up  6. CARDIAC HISTORY: Do you have any history of heart disease? (e.g., heart attack, angina, bypass surgery, angioplasty)    9. OTHER SYMPTOMS: Do you have any other symptoms? (e.g., chest pain, cough, dizziness, fever, runny nose)     Chest tightness at the time  Protocols used: Breathing Difficulty-[name]-AH

## 2024-07-04 NOTE — Telephone Encounter (Signed)
 Pt is scheduled for ov with [facility] 07/05/25 and is aware to keep this appt for eval.

## 2024-07-04 NOTE — Progress Notes (Unsigned)
 @Patient  ID: [name] [name], female    DOB: 06/24/1961, 63 y.o.   MRN: [id number]  No chief complaint on file.   Referring provider: [name], NP  HPI: 63 year old female, never smoked.  Past medical history significant for mild intermittent asthma, allergies, acute respiratory failure, elevated blood pressure readings, osteoarthritis, alcohol  use disorder, anxiety/depression.   Previous [facility] pulmonary encounter: 03/28/2021 Patient presents today for hospital follow-up.  She was admitted from 03/18/2021-03/25/2021 for acute on chronic hypercarbic respiratory failure, acute on chronic diastolic heart failure.  Patient was consulted by Dr. Kassie with PCCM inpatient.  Patient presented to emergency room shortness of breath/wheezing.  COVID test was negative.  Oxygen  saturation was 89% on room air.  She received albuterol  nebulized treatments and IV Solu-Medrol .  It was recommended that she restart her Symbicort  for mild intermittent asthma.  BiPAP at bedtime, patient likely has undiagnosed obesity hypoventilation syndrome and possibly OSA.  She was discharged on 2 L of oxygen  and prednisone  taper.  Need outpatient sleep study.  She is doing well today. She has had not episodes of shortness of breath, wheezing or chest tightness. She is using Breo 200mcg daily as prescribed. Prior to hospitalization she was not sleeping well. She is currently taking trazodone  prescribed buy her PCP which has been helping.   Sleep questionnaire Symptoms- Snoring, restless sleep Prior sleep study- None Bedtime-9-10pm Out of bed in morning- 6am Nocturnal awakenings- once  Epworth- 7   May 12th 2023- Dr. Kara  Bentlie Withem is a 63 year old woman, never smoker with history of asthma, alcohol  abuse, atrial fibrillation, hypertension and HFpEF who was admitted at [facility] 4/9 to 4/18 for asthma exacerbation and atrial fibrillation with RVR. She returns to pulmonary clinic for follow up.   She was treated with  steroid taper and ICS/LAMA/LABA nebulizer treatments while in the hospital. She had TEE cardiversion for the a fib with RVR on 10/31/21 and new LVEF of 25-30%. She had EGD on 4/17 esophageal plaques (biopsied), 2 cm hiatal hernia and erythematous mucosa in the antrum (biopsied) and normal duodenum (biopsied).  Colonoscopy with normal examined ileum, colon polyps and nonbleeding internal hemorrhoid.  She was discharged with Breo ellipta  200 inhaler 1 puff every other day. She is using albuterol  inhaler about 4 times per week. She is using oxygen  2L at night.   She has stress test on Monday with cardiology.  She reports doing well since her hospitalization. She is rarely drinking alcohol  now. She denies cough, wheezing or dyspnea. She denies night time awakenings.   05/06/2022 Patient presents today for overdue follow-up. Hx asthma, afib, HF. Admitted in September for acute on chronic diastolic heart failure, COPD exacerbation and acute on chronic respiratory failure. Discharged on prednisone  taper. Maintained on Breo 200mcg and Torsemide . She is doing well, feeling better. She is here to go over sleep study results from 01/01/22 that showed severe OSA, AHI 63/hour. We discussed treatment options, recommending she be started on CPAP d/t severity of OSA and cardiac history. She is open to starting treatment.    11/26/2023 Discussed the use of AI scribe software for clinical note transcription with the patient, who gave verbal consent to proceed.  History of Present Illness   [name] [name] is a 63 year old female with mild intermittent asthma who presents for follow-up of her respiratory symptoms.  She has experienced significant improvement in her asthma symptoms since her last visit. She is currently using Breo 200 micrograms, one puff  daily, and has not needed her rescue inhaler in the past week. She had exacerbations in February, March, and April, which required emergency room visits and urgent care.  During these episodes, she used her albuterol  inhaler and nebulizer without sufficient relief, leading to treatment with IV steroids and prednisone . Her symptoms have improved significantly since then, and she attributes some of the exacerbations to flu and pollen exposure.  She was diagnosed with severe obstructive sleep apnea in 2023 and was recommended CPAP therapy, which she used for about a month before discontinuing due to discomfort. She reports no current issues with sleep and has lost 30 pounds since her sleep study, which she believes has contributed to her improved sleep quality. No waking up gasping or choking, and no snoring, as confirmed by her daughter.  She has a history of alcohol  use but reports cessation since September 2023. She has not consumed alcohol  since then and attributes this change to personal decisions and lifestyle modifications.     1. Mild intermittent asthma without complication (Primary) - Pulmonary Function Test; Future   2. Severe sleep apnea   Assessment and Plan    Mild Intermittent Asthma Mild intermittent asthma with exacerbations in February, March, and April, necessitating emergency room and urgent care visits. Symptoms have improved significantly with the current treatment regimen. Recent exacerbations likely due to influenza and pollen exposure. Currently well-managed with no recent need for rescue inhaler. - Continue Breo Ellipta  200 mcg one puff daily and montelukast  10mg  at bedtime. - Use albuterol  inhaler as needed for rescue, every 4-6 hours. - Order pulmonary function test for baseline assessment at next follow-up. - Advise to contact clinic if respiratory symptoms worsen, including cough, dyspnea, chest tightness, or wheezing.   Severe Obstructive Sleep Apnea Severe obstructive sleep apnea diagnosed in 2023. She reports significant weight loss and cessation of CPAP use, with no current symptoms. Declines repeat sleep study and CPAP use. Weight  loss likely contributed to symptom improvement. - Set weight loss goal to 185 pounds. - Advise sleeping on side or using a wedge pillow to elevate head. - Discuss potential use of weight loss medication Zepbound (tirzepatide) with primary care physician if needed. - Advise against alcohol  consumption before bed and driving if fatigued.   Atrial Fibrillation Continue Eliquis  as directed    04/05/2024 Discussed the use of AI scribe software for clinical note transcription with the patient, who gave verbal consent to proceed.  Asthma- Breo 200 and montelukast . Ordered PFTs OSA- Diagnosed with severe OSA in 2023, AHI 63/hour. Ordered CPAP in October 2023 auto settings 5-20cm h20. She lost weight and stopped wearing CPAP. No currently symptomatic. Declined repeat sleep study and CPAP use. Goal weight 185. Current weight 206. Afib - under went TTE guided cardioversion in April 2023. On metoprolol  XL and Eliquis  HTN - Previous readings have been elevated 170/90s (goal <130/80) stated on spirnolactone 25mg  daily, increased entresto  twice daily in August  History of Present Illness [name] [name] is a 63 year old female with asthma and severe sleep apnea who presents with a new onset cough.  She has a new onset dry cough that began approximately two weeks ago. There is no mucus production, but she experiences a sensation of congestion in her chest and a slight nasal drip. No foul taste or significant chest tightness. Current asthma medications include Breo 200 micrograms, one puff daily, and montelukast  (Singulair ).  Her asthma was previously diagnosed as mild intermittent, with flare-ups occurring in February, March,  and April. She denies any history of smoking and has never been diagnosed with asthma as a child. Recent pulmonary function tests show no obstructive lung disease but indicate some restrictive lung disease. There is a partial bronchodilator response.  She was diagnosed with severe sleep  apnea in 2023, with 63 pauses per hour during her sleep study. She has previously declined a repeat sleep study and the use of CPAP therapy. She reports significant weight loss since her last evaluation, attributing it to cessation of alcohol  consumption. No current symptoms of snoring or waking up gasping or choking, and her granddaughter has not recently commented on her snoring.  She has a history of atrial fibrillation and was cardioverted in 2023. She is currently on metoprolol  extended-release and Eliquis . She also has a history of hypertension, with recent medication adjustments including spironolactone  25 mg daily and an increased dose of Entresto , which have improved her blood pressure readings.  She reports a significant weight loss of approximately 25 pounds, achieved primarily through cessation of alcohol  consumption and dietary changes. She is working towards a goal weight of 185 pounds. She mentions a preference for a heart-healthy diet but struggles with cravings for sweets like honey buns and donuts.     1. Severe sleep apnea (Primary)  2. Nocturnal hypoxia  3. Mild intermittent asthma without complication  Assessment and Plan Assessment & Plan Mild intermittent asthma Mild intermittent asthma with recent onset of dry cough for approximately two weeks. No obstructive lung disease on recent pulmonary function tests. Partial or borderline bronchodilator response. No current wheezing or chest tightness. Possible contribution from allergies. - Continue Breo 200 mcg, one puff daily - Continue montelukast  - Consider over-the-counter antihistamine like Claritin  or Zyrtec - Consider nasal spray for nasal drip  Obstructive sleep apnea, severe Severe obstructive sleep apnea diagnosed in 2023 with 63 apneic events per hour. CPAP intolerance due to discomfort. Weight loss noted since last evaluation. Potential impact on cardiovascular health discussed, including increased risk for atrial  fibrillation, pulmonary hypertension, diabetes, and Alzheimer's if untreated. She declined surgical option (Inspire) and medication (Zepbound) for weight loss. Open to home sleep study and potential CPAP discussion if apnea remains moderate or severe. - Order home sleep study to reassess severity of sleep apnea - Discuss CPAP therapy if sleep study shows moderate or severe apnea - Consider oral appliance therapy if CPAP is not tolerated - Emphasize importance of weight loss for management of sleep apnea  Restrictive lung disease Restrictive lung disease noted on pulmonary function tests, possibly related to weight or chest anatomy. Total lung capacity is normal. No obstructive lung disease present.  Pulmonary hypertension Pulmonary hypertension with enlarged pulmonary artery. Potential contribution from sleep apnea and chronic diastolic heart failure.  Atrial fibrillation, status post cardioversion Atrial fibrillation, status post cardioversion in 2023. Currently on metoprolol  and Eliquis  for rate control and anticoagulation.  Essential hypertension Essential hypertension with previous elevated readings. Recent improvement in blood pressure control with spironolactone  and increased dose of Entresto . Current reading is 132/70 mmHg.  Obesity Obesity with BMI of 36. Weight loss of 25 pounds noted since last visit. She declined medication for weight loss. Emphasis on lifestyle modifications and dietary changes. - Encourage continued weight loss efforts - Discuss dietary modifications and portion control - Reinforce importance of weight loss for overall health  07/05/2024- Interim hx     Asthma Sleep apnea Afib     Pulmonary function testing: 04/05/2024 >> FVC 1.96 (62%), FEV1 1.49 (62%), ratio 76  Restrictive lungs disease, borderline BD response, mild diffusion defect  Imaging: 10/29/21 CTA>> Negative for PE. Lungs are clear. No pleural effusion or pneumothorax. Incidental small  pleural lipoma about the peripheral right upper lobe. Cardiomegaly. Enlargement of main pulmonary artery.        07/05/2024 Discussed the use of AI scribe software for clinical note transcription with the patient, who gave verbal consent to proceed.  History of Present Illness      Allergies[1]  Immunization History  Administered Date(s) Administered   PPD Test 03/12/2015   Tdap 03/27/2015    Past Medical History:  Diagnosis Date   Anemia    Anxiety    Arthritis    Asthma    Bloody stool    Chronic diastolic heart failure (HCC)    COPD (chronic obstructive pulmonary disease) (HCC)    Dental decay    Dysrhythmia    Atrial flutter   Gastritis, Helicobacter pylori 06/22/2015   Hypertension    Paroxysmal atrial fibrillation (HCC)    Pneumonia 06/21/2015   Prepyloric ulcer 06/22/2015   Seasonal allergies    Sleep apnea    no CPAP    Tobacco History: Tobacco Use History[2] Counseling given: Not Answered   Outpatient Medications Prior to Visit  Medication Sig Dispense Refill   albuterol  (PROVENTIL ) (2.5 MG/3ML) 0.083% nebulizer solution Take 3 mLs (2.5 mg total) by nebulization every 6 (six) hours as needed for wheezing or shortness of breath. 150 mL 6   albuterol  (VENTOLIN  HFA) 108 (90 Base) MCG/ACT inhaler Inhale 2 puffs into the lungs every 6 (six) hours as needed for wheezing or shortness of breath. 8.5 g 11   atorvastatin  (LIPITOR) 20 MG tablet TAKE 1 TABLET (20 MG TOTAL) BY MOUTH AT BEDTIME FOR CHOLESTEROL 90 tablet 1   Bioflavonoid Products (VITAMIN C PLUS PO) Take 1 tablet by mouth daily.     Blood Pressure Monitoring (OMRON 3 SERIES BP MONITOR) DEVI Monitor blood pressure daily. 1 each 0   BREO ELLIPTA  200-25 MCG/ACT AEPB TAKE 1 PUFF BY MOUTH EVERY DAY 60 each 1   cholecalciferol  (VITAMIN D3) 25 MCG (1000 UNIT) tablet Take 1,000 Units by mouth in the morning.     ELIQUIS  5 MG TABS tablet TAKE 1 TABLET BY MOUTH TWICE A DAY 180 tablet 1   hydrocortisone   cream 0.5 % Apply 1 Application topically 2 (two) times daily. 30 g 0   methocarbamol  (ROBAXIN ) 500 MG tablet Take 1 tablet (500 mg total) by mouth every 8 (eight) hours as needed for muscle spasms. 30 tablet 1   metoprolol  succinate (TOPROL -XL) 25 MG 24 hr tablet TAKE 1 TABLET (25 MG TOTAL) BY MOUTH DAILY. 30 tablet 3   montelukast  (SINGULAIR ) 10 MG tablet Take 1 tablet (10 mg total) by mouth at bedtime. 90 tablet 1   OXYGEN  Inhale 2 L/min into the lungs as needed (wheezing/respiratory issues.).     sacubitril -valsartan  (ENTRESTO ) 49-51 MG TAKE 1 TABLET BY MOUTH TWICE A DAY 180 tablet 2   traZODone  (DESYREL ) 50 MG tablet TAKE 1 TABLET BY MOUTH EVERYDAY AT BEDTIME 30 tablet 11   No facility-administered medications prior to visit.      Review of Systems  Review of Systems   Physical Exam  There were no vitals taken for this visit. Physical Exam  ***  Lab Results:  CBC    Component Value Date/Time   WBC 9.7 04/08/2024 1354   RBC 4.29 04/08/2024 1354   HGB 12.6 04/08/2024 1354   HGB  13.1 08/22/2021 1143   HCT 39.3 04/08/2024 1354   HCT 39.8 08/22/2021 1143   PLT 217 04/08/2024 1354   PLT 285 08/22/2021 1143   MCV 91.6 04/08/2024 1354   MCV 87 08/22/2021 1143   MCH 29.4 04/08/2024 1354   MCHC 32.1 04/08/2024 1354   RDW 12.7 04/08/2024 1354   RDW 14.9 08/22/2021 1143   LYMPHSABS 2.6 10/26/2023 0904   LYMPHSABS 2.1 08/22/2021 1143   MONOABS 0.6 10/26/2023 0904   EOSABS 0 (L) 04/08/2024 1354   EOSABS 0.1 08/22/2021 1143   BASOSABS 10 04/08/2024 1354   BASOSABS 0.0 08/22/2021 1143    BMET    Component Value Date/Time   NA 141 03/01/2024 1406   K 3.9 03/01/2024 1406   CL 101 03/01/2024 1406   CO2 21 03/01/2024 1406   GLUCOSE 103 (H) 03/01/2024 1406   GLUCOSE 98 10/26/2023 0904   BUN 13 03/01/2024 1406   CREATININE 0.63 03/01/2024 1406   CREATININE 0.61 07/17/2023 0955   CALCIUM  9.3 03/01/2024 1406   GFRNONAA >60 10/26/2023 0904   GFRAA >60 10/28/2019 1227     BNP    Component Value Date/Time   BNP 78.5 10/26/2023 0904    ProBNP    Component Value Date/Time   PROBNP 187 03/01/2024 1406   PROBNP 59.0 03/14/2010 0710    Imaging: No results found.   Assessment & Plan:   No problem-specific Assessment & Plan notes found for this encounter.   There are no diagnoses linked to this encounter.  Assessment and Plan Assessment & Plan       I personally spent a total of *** minutes in the care of the patient today including {Time Based Coding:210964241}.   Almarie LELON Ferrari, NP 07/04/2024    [1]  Allergies Allergen Reactions   Sodium Ferric Gluconate [Ferrous Gluconate] Other (See Comments)    Patient had near syncope with abdominal pain and diaphoresis about an hour after IV ferric gluconate although it was while she was on commode having bowel movements   Imdur [Isosorbide Nitrate]     headache   Tetracyclines & Related Itching  [2]  Social History Tobacco Use  Smoking Status Never  Smokeless Tobacco Never

## 2024-07-05 ENCOUNTER — Ambulatory Visit: Admitting: Primary Care

## 2024-07-05 ENCOUNTER — Encounter: Payer: Self-pay | Admitting: Primary Care

## 2024-07-05 VITALS — BP 126/70 | HR 64 | Temp 97.3°F | Ht 63.0 in | Wt 208.2 lb

## 2024-07-05 DIAGNOSIS — G4734 Idiopathic sleep related nonobstructive alveolar hypoventilation: Secondary | ICD-10-CM

## 2024-07-05 DIAGNOSIS — G4730 Sleep apnea, unspecified: Secondary | ICD-10-CM

## 2024-07-05 DIAGNOSIS — J4520 Mild intermittent asthma, uncomplicated: Secondary | ICD-10-CM

## 2024-07-05 MED ORDER — ALBUTEROL SULFATE (2.5 MG/3ML) 0.083% IN NEBU: 2.5000 mg | INHALATION_SOLUTION | Freq: Four times a day (QID) | RESPIRATORY_TRACT | 5 refills | Status: AC | PRN

## 2024-07-05 MED ORDER — BREO ELLIPTA 200-25 MCG/ACT IN AEPB: 1.0000 | INHALATION_SPRAY | Freq: Every day | RESPIRATORY_TRACT | 5 refills | Status: AC

## 2024-07-05 NOTE — Patient Instructions (Signed)
°  VISIT SUMMARY: Today, we discussed your asthma management and sleep apnea. You shared your concerns about a previous COPD diagnosis, which we reviewed and decided to remove from your medical record. We also talked about your recent sleep study results and your progress with weight loss and lifestyle changes.  YOUR PLAN: -ASTHMA: Asthma is a condition where your airways narrow and swell, making it hard to breathe. You should continue using your Breo inhaler once daily and rinse your mouth after each use. Use your albuterol  inhaler if you experience chest tightness, wheezing, or shortness of breath. A nebulizer has been prescribed for use during acute asthma attacks or if your rescue inhaler is not effective.  -OBSTRUCTIVE SLEEP APNEA WITH NOCTURNAL HYPOXEMIA: Obstructive sleep apnea is a condition where your breathing stops and starts during sleep, leading to low oxygen  levels at night. Your condition has improved from severe to mild due to weight loss and quitting alcohol . Continue your weight loss efforts and use a wedge pillow to help with your sleep position. Use 2 liters of oxygen  at night and monitor your symptoms. If your symptoms worsen, we may need to consider CPAP or an oral appliance.  INSTRUCTIONS: Please continue with your current medications and follow the outlined plan for asthma and sleep apnea management. Use the nebulizer as prescribed and monitor your symptoms closely. If you experience any worsening of symptoms or have concerns, schedule a follow-up appointment.

## 2024-07-14 ENCOUNTER — Other Ambulatory Visit: Payer: Self-pay | Admitting: Nurse Practitioner

## 2024-07-14 DIAGNOSIS — J449 Chronic obstructive pulmonary disease, unspecified: Secondary | ICD-10-CM

## 2024-08-04 ENCOUNTER — Other Ambulatory Visit: Payer: Self-pay

## 2024-08-29 ENCOUNTER — Ambulatory Visit: Admitting: Nurse Practitioner

## 2024-12-29 ENCOUNTER — Ambulatory Visit: Admitting: Primary Care

## 2025-02-28 ENCOUNTER — Ambulatory Visit: Payer: Self-pay | Admitting: Nurse Practitioner
